# Patient Record
Sex: Female | Born: 1950 | Race: White | Hispanic: No | Smoking: Former smoker
Health system: Southern US, Community
[De-identification: ages and names within clinical notes are randomized; demographics above are authoritative.]

## PROBLEM LIST (undated history)

## (undated) DIAGNOSIS — I10 Essential (primary) hypertension: Secondary | ICD-10-CM

## (undated) DIAGNOSIS — E559 Vitamin D deficiency, unspecified: Secondary | ICD-10-CM

## (undated) DIAGNOSIS — J439 Emphysema, unspecified: Secondary | ICD-10-CM

## (undated) DIAGNOSIS — I499 Cardiac arrhythmia, unspecified: Secondary | ICD-10-CM

## (undated) DIAGNOSIS — E7800 Pure hypercholesterolemia, unspecified: Secondary | ICD-10-CM

## (undated) DIAGNOSIS — R0600 Dyspnea, unspecified: Secondary | ICD-10-CM

## (undated) DIAGNOSIS — Z923 Personal history of irradiation: Secondary | ICD-10-CM

## (undated) DIAGNOSIS — Z9221 Personal history of antineoplastic chemotherapy: Secondary | ICD-10-CM

## (undated) DIAGNOSIS — F329 Major depressive disorder, single episode, unspecified: Secondary | ICD-10-CM

## (undated) DIAGNOSIS — Z800 Family history of malignant neoplasm of digestive organs: Secondary | ICD-10-CM

## (undated) DIAGNOSIS — Z803 Family history of malignant neoplasm of breast: Secondary | ICD-10-CM

## (undated) DIAGNOSIS — I493 Ventricular premature depolarization: Secondary | ICD-10-CM

## (undated) DIAGNOSIS — D649 Anemia, unspecified: Secondary | ICD-10-CM

## (undated) DIAGNOSIS — H269 Unspecified cataract: Secondary | ICD-10-CM

## (undated) DIAGNOSIS — C44621 Squamous cell carcinoma of skin of unspecified upper limb, including shoulder: Secondary | ICD-10-CM

## (undated) DIAGNOSIS — C44622 Squamous cell carcinoma of skin of right upper limb, including shoulder: Secondary | ICD-10-CM

## (undated) DIAGNOSIS — E669 Obesity, unspecified: Secondary | ICD-10-CM

## (undated) DIAGNOSIS — R9431 Abnormal electrocardiogram [ECG] [EKG]: Secondary | ICD-10-CM

## (undated) DIAGNOSIS — E119 Type 2 diabetes mellitus without complications: Secondary | ICD-10-CM

## (undated) DIAGNOSIS — E785 Hyperlipidemia, unspecified: Secondary | ICD-10-CM

## (undated) DIAGNOSIS — E079 Disorder of thyroid, unspecified: Secondary | ICD-10-CM

## (undated) HISTORY — DX: Family history of malignant neoplasm of digestive organs: Z80.0

## (undated) HISTORY — DX: Family history of malignant neoplasm of breast: Z80.3

## (undated) HISTORY — DX: Major depressive disorder, single episode, unspecified: F32.9

## (undated) HISTORY — DX: Squamous cell carcinoma of skin of right upper limb, including shoulder: C44.622

## (undated) HISTORY — DX: Vitamin D deficiency, unspecified: E55.9

## (undated) HISTORY — DX: Squamous cell carcinoma of skin of unspecified upper limb, including shoulder: C44.621

## (undated) HISTORY — PX: SQUAMOUS CELL CARCINOMA EXCISION: SHX2433

## (undated) HISTORY — DX: Unspecified cataract: H26.9

## (undated) HISTORY — PX: OTHER SURGICAL HISTORY: SHX169

## (undated) HISTORY — PX: PR UNLISTED PROCEDURE BREAST: 19499

## (undated) HISTORY — PX: PR COLONOSCOPY STOMA DX INCLUDING COLLJ SPEC SPX: 44388

## (undated) HISTORY — DX: Abnormal electrocardiogram (ECG) (EKG): R94.31

## (undated) HISTORY — DX: Disorder of thyroid, unspecified: E07.9

## (undated) HISTORY — DX: Type 2 diabetes mellitus without complications: E11.9

## (undated) HISTORY — DX: Obesity, unspecified: E66.9

## (undated) HISTORY — DX: Hyperlipidemia, unspecified: E78.5

## (undated) MED ORDER — LEVOTHYROXINE SODIUM 112 MCG OR TABS
ORAL_TABLET | ORAL | 1 refills | Status: AC
Start: 2015-12-17 — End: ?

## (undated) MED ORDER — METFORMIN HCL 1000 MG OR TABS
ORAL_TABLET | ORAL | Status: AC
Start: 2015-06-13 — End: ?

## (undated) MED ORDER — NOVOLIN 70/30 RELION (70-30) 100 UNIT/ML SC SUSP
SUBCUTANEOUS | 0 refills | Status: AC
Start: 2017-01-08 — End: ?

---

## 1969-02-03 HISTORY — PX: TONSILLECTOMY: SUR1361

## 1988-02-04 HISTORY — PX: PR TOTAL ABDOMINAL HYSTERECT W/WO RMVL TUBE OVARY: 58150

## 1991-02-04 HISTORY — PX: SURGICAL HX OTHER: 99

## 1992-02-04 HISTORY — PX: APPENDECTOMY: SHX54

## 1993-02-03 HISTORY — PX: ABDOMINAL HYSTERECTOMY: SHX81

## 1996-02-04 HISTORY — PX: BREAST BIOPSY: SHX20

## 2006-02-03 HISTORY — PX: MICRODISCECTOMY LUMBAR: SUR864

## 2006-05-27 ENCOUNTER — Ambulatory Visit (HOSPITAL_COMMUNITY): Admission: RE | Admit: 2006-05-27 | Discharge: 2006-05-28 | Payer: Self-pay | Admitting: Neurosurgery

## 2006-07-14 ENCOUNTER — Ambulatory Visit (HOSPITAL_COMMUNITY): Admission: RE | Admit: 2006-07-14 | Discharge: 2006-07-14 | Payer: Self-pay | Admitting: Neurosurgery

## 2006-08-24 ENCOUNTER — Ambulatory Visit (HOSPITAL_COMMUNITY): Admission: RE | Admit: 2006-08-24 | Discharge: 2006-08-25 | Payer: Self-pay | Admitting: Neurosurgery

## 2007-05-25 ENCOUNTER — Ambulatory Visit: Payer: Self-pay | Admitting: Family Medicine

## 2007-07-24 ENCOUNTER — Ambulatory Visit (HOSPITAL_COMMUNITY): Admission: RE | Admit: 2007-07-24 | Discharge: 2007-07-24 | Payer: Self-pay | Admitting: Neurosurgery

## 2008-02-26 ENCOUNTER — Encounter: Admission: RE | Admit: 2008-02-26 | Discharge: 2008-02-26 | Payer: Self-pay | Admitting: Neurosurgery

## 2008-06-08 ENCOUNTER — Encounter: Admission: RE | Admit: 2008-06-08 | Discharge: 2008-06-08 | Payer: Self-pay | Admitting: Neurosurgery

## 2008-06-19 ENCOUNTER — Ambulatory Visit (HOSPITAL_COMMUNITY): Admission: RE | Admit: 2008-06-19 | Discharge: 2008-06-20 | Payer: Self-pay | Admitting: Neurosurgery

## 2009-10-26 ENCOUNTER — Ambulatory Visit: Payer: Self-pay | Admitting: Family Medicine

## 2010-05-14 LAB — CBC
HCT: 37.3 % (ref 36.0–46.0)
Hemoglobin: 12.8 g/dL (ref 12.0–15.0)
MCHC: 34.2 g/dL (ref 30.0–36.0)
MCV: 89.4 fL (ref 78.0–100.0)
RDW: 14.5 % (ref 11.5–15.5)

## 2010-05-14 LAB — BASIC METABOLIC PANEL
BUN: 12 mg/dL (ref 6–23)
Chloride: 103 mEq/L (ref 96–112)
Glucose, Bld: 86 mg/dL (ref 70–99)
Potassium: 4.2 mEq/L (ref 3.5–5.1)

## 2010-06-18 NOTE — Op Note (Signed)
NAME:  Victoria Dean, Victoria Dean NO.:  [id number]   MEDICAL RECORD NO.:  [id number]          PATIENT TYPE:  OIB   LOCATION:  3172                         FACILITY:  [facility]   PHYSICIAN:  Cristi Loron, M.D.DATE OF BIRTH:  Aug 09, 1950   DATE OF PROCEDURE:  06/19/2008  DATE OF DISCHARGE:                               OPERATIVE REPORT   BRIEF HISTORY:  The patient is a 60 year old female who has suffered  from 2 previous L4-L5 ruptured disk.  She has developed recurrent back  and leg pain and was worked up with a lumbar MRI, which demonstrated a  small-to-moderate ruptured disk, L4-L5 and more large far lateral  ruptured disk at L3-L4 on the right.  I discussed the various treatment  options with the patient including surgery.  The patient has weighed the  risks, benefits, alternatives of surgery and decided to proceed with a  redo right L4-L5 diskectomy as well as a right L3-L4 far lateral  diskectomy.   PREOPERATIVE DIAGNOSES:  Recurrent L4-L5 herniated nucleus pulposus,  right L3-L4 far lateral herniated nucleus pulposus, lumbago, lumbar  radiculopathy, disk degeneration.   POSTOPERATIVE DIAGNOSES:  Recurrent L4-L5 herniated nucleus pulposus,  right L3-L4 far lateral herniated nucleus pulposus, lumbago, lumbar  radiculopathy, disk degeneration.   PROCEDURES:  Redo right L4-L5 diskectomy and right L3-L4 far lateral  diskectomy using microdissection.   SURGEON:  Cristi Loron, MD.   ASSISTANT:  Stefani Dama, MD   ANESTHESIA:  General endotracheal.   ESTIMATED BLOOD LOSS:  100 mL.   SPECIMENS:  None.   DRAINS:  None.   COMPLICATIONS:  None.   DESCRIPTION OF PROCEDURE:  The patient was brought to the operating room  by the Anesthesia team.  General endotracheal anesthesia was induced.  The patient was turned to the prone position on Wilson frame.  Her  lumbosacral region was then prepared with Betadine scrub and Betadine  solution.  Sterile drapes were  applied.  I then injected the area to be  incised with Marcaine with epinephrine solution.  I used scalpel to make  a linear midline incision through the patient's previous surgical scar  and extended it somewhat in a cephalad direction.  We then used  electrocautery to perform a right-sided subperiosteal dissection  exposing spinous process and lamina of L3, L4, L5.  We obtained a series  of intraoperative radiographs to confirm our location.   We began the decompression at L4-L5 by using high-speed drill to widen  the patient's prior L4 laminotomy.  We carefully dissected through the  epidural scar tissue and identified the dura.  We performed a  foraminotomy about the right L5 nerve root.  We then used  microdissection to free up the nerve root and thecal sac from the  epidural fibrosis and then Dr. Danielle Dess gently retracted the neural  structures medially.  This exposed the disk herniation at L4-L5.  We  incised the L4-L5 intervertebral disk and performed a partial  intervertebral diskectomy using the pituitary forceps and the Epstein  curettes.  After we were satisfied with the intervertebral diskectomy,  we used the osteophyte tool to remove some redundant ligament from the  vertebral endplates at L4-L5 further decompressing L5 nerve root.  We  then palpated along ventral surface of the thecal sac along exit route  of the L5 nerve root and noted that it was well decompressed.   We now turned our attention to far lateral diskectomy at L3-L4.  We used  high-speed drill to remove the lateral aspect of the right L3 pars.  This gave [place] access to intertransverse ligament.  We removed the  ligament with the Kerrison punches and then carefully dissected through  the intertransverse muscle and using microdissection we exposed the  exiting L3 nerve root.  We then dissected anterior and caudal to the L3  nerve root and encountered multiple large fragments of disk herniation.  We removed these  disk herniated fragments using the micropituitary  forceps.  We did encounter a far lateral hole in the annulus fibrosus,  but we got a good decompression of the nerve root and we did not enter  into the intervertebral disk space.  We then palpated along the ventral  surface of the L3 nerve root and noted that it was well decompressed  from its intraspinal portion all the way up to soft tissues.  We  obtained hemostasis using bipolar electrocautery.  We irrigated the  wound out with bacitracin solution.  We then removed the retractors and  reapproximated the patient's thoracolumbar fascia with interrupted #1  Vicryl suture, subcutaneous tissue with 2-0 Vicryl suture, and the skin  with Steri-Strips and Benzoin.  The wound was then coated with  bacitracin ointment and sterile dressings applied.  The drapes were  removed.  The patient subsequently returned to supine position where she  was extubated by the Anesthesia team and transported to post anesthesia  care unit in stable condition.  All sponge, instrument, and needle  counts were correct at the end of this case.      Cristi Loron, M.D.  Electronically Signed     JDJ/MEDQ  D:  06/19/2008  T:  06/20/2008  Job:  [id number]

## 2010-06-18 NOTE — Op Note (Signed)
NAME:  Victoria Dean, Victoria Dean NO.:  [id number]   MEDICAL RECORD NO.:  [id number]          PATIENT TYPE:  OIB   LOCATION:  3036                         FACILITY:  [facility]   PHYSICIAN:  Cristi Loron, M.D.DATE OF BIRTH:  20-Jun-1950   DATE OF PROCEDURE:  08/24/2006  DATE OF DISCHARGE:                               OPERATIVE REPORT   BRIEF HISTORY:  The patient is a 60 year old white female who has  suffered a ruptured disk at L4-5 on the right, about 4 months ago.  She  underwent a microdiskectomy at that time and initially did well; but,  she has developed a recurrence of her upper back and right leg pain.  She failed medical management and was worked up with a lumbar MRI, which  demonstrated a recurrent herniated disk at L4-5.  I discussed the  various treatment options with the patient including surgery.  The  patient has weighed the risks, benefits and alternatives and agreed to  undergo a redo right L4-5 microdiskectomy.   PREOPERATIVE DIAGNOSES:  1. Recurrent right L4-5 herniated nucleus pulposus.  2. Disk degeneration.  3. Lumbar radiculopathy.  4. Lumbago.   POSTOPERATIVE DIAGNOSES:  1. Recurrent right L4-5 herniated nucleus pulposus.  2. Disk degeneration.  3. Lumbar radiculopathy.  4. Lumbago.   PROCEDURE:  Redo right L4 hemilaminectomy to decompress both the right  L4 and L5 nerve roots, using microdissection.   SURGEON:  Cristi Loron, M.D.   ASSISTANT:  Hewitt Shorts, M.D.   ANESTHESIA:  General endotracheal.   ESTIMATED BLOOD LOSS:  100 mL.   SPECIMENS:  None.   DRAINS:  None.   COMPLICATIONS:  None.   PROCEDURE:  The patient was brought to the operating room by the  anesthesia team.  General endotracheal anesthesia was induced.  The  patient was then turned to the prone position on the Wilson frame.  The  lumbosacral region was then prepared with Betadine scrub and Betadine  solution.  Sterile drapes were applied.  I then  injected the area to be  incised with Marcaine with epinephrine solution; used a scalpel to make  a linear midline incision through her previous lumbar surgical scar.  I  then used electrocautery to perform a right-sided subperiosteal  dissection, exposing the right spinous process of the lamina of L4 and  L5.  We obtained intraoperative radiograph to confirm our location, and  then inserted the [facility] retractor for exposure.   We then brought the operative microscope into the field, and under its  magnification and illumination we completed the  microdissection/decompression.  I used a high-speed drill to drill  through some of the epidural scar tissue.  I drilled a little more  laterally.  I widened the previous L4 hemilaminectomy.  I used the high-  speed drill until I encountered some relatively nonscarred dura.  I then  used microdissection to free up the epidural scar tissue from the  underlying dura, and used a Kerrison punch to remove the epidural scar  tissue.  I then performed a foraminotomy about both the right L5  then  the L4 nerve root, exposing both these nerves in the lateral recesses.  I then inspected the intervertebral disk at L4-5 which was bulging  somewhat diffusely.  We then inspected to cephalad at L4-5  intervertebral disk, and we encountered the expected free fragment disk  herniation; this had migrated in the cephalad direction, and was  compressing the L4 nerve root just as it entered into the neural  foramen.  We freed up this disk herniation with the nerve hooks, and  using microdissection we removed multiple fragments of free fragment  disk herniation.  I somewhat entered into the neural foramen.   At this point we had a good decompression of the L4 and L5 nerve roots.  We palpated both nerve roots from both above and below using the nerve  hooks.  We then again inspected the L4-5 intervertebral disk.  We  decided not to incise the annulus, as there did  not appear to be any  impending herniations.  We then obtained hemostasis using bipolar  electrocautery.  We irrigated the wound out with bacitracin solution.  I  then palpated along the ventral surface of the thecal sac and along the  exit route of the right L4 and L5 nerve roots; both were noted to be  well decompressed.  We then removed the retractor and then  reapproximated the patient's thoracolumbar fascia with interrupted #1  Vicryl suture; the subcutaneous tissue with interrupted 2-O Vicryl  suture, and the skin with Steri-Strips and Benzoin.  The wound was then  coated with bacitracin ointment.  A sterile dressing was applied.  The  drapes were removed.  The patient was subsequently returned to the  supine position, where she was extubated by the anesthesia team and  transported to the post anesthesia care unit in stable condition.  All  sponge, instrument and needle counts were correct at the end of this  case.      Cristi Loron, M.D.  Electronically Signed     JDJ/MEDQ  D:  08/24/2006  T:  08/25/2006  Job:  [id number]

## 2010-06-21 NOTE — Op Note (Signed)
NAME:  Victoria Dean, BESSLER NO.:  [id number]   MEDICAL RECORD NO.:  [id number]          PATIENT TYPE:  AMB   LOCATION:  SDS                          FACILITY:  [facility]   PHYSICIAN:  Cristi Loron, M.D.DATE OF BIRTH:  [birth year]   DATE OF PROCEDURE:  05/27/2006  DATE OF DISCHARGE:                               OPERATIVE REPORT   BRIEF HISTORY:  The patient is a 60 year old white female who has had  approximately a one week history of severe right leg pain consistent  with a right L4 radiculopathy.  She has failed medical management and  was worked up with a lumbar MRI which demonstrated a herniated disc at  L4-L5 on the right exiting right L4 nerve root.  I discussed the various  treatment options with her including surgery.  The patient has weighed  the risks, benefits and alternatives of surgery and decided to proceed  with the right L4-L5 microdiscectomy.   PREOPERATIVE DIAGNOSIS:  Right L4-L5 herniated nucleus pulposus, spinal  stenosis, lumbar radiculopathy, lumbago.   POSTOPERATIVE DIAGNOSIS:  Right L4-L5 herniated nucleus pulposus, spinal stenosis, lumbar  radiculopathy, lumbago.   PROCEDURE:  Right L4 hemilaminectomy for decompression of the right L5  and L4 nerve root using microdissection and right L4-L5 microdiscectomy.   SURGEON:  Cristi Loron, M.D.   ASSISTANT:  Coletta Memos, M.D.   ANESTHESIA:  General endotracheal anesthesia.   ESTIMATED BLOOD LOSS:  50 mL.   SPECIMENS:  None.   DRAINS:  None.   COMPLICATIONS:  None.   DESCRIPTION OF PROCEDURE:  The patient was brought to the operating room  by the anesthesia team.  General endotracheal anesthesia was induced.  The patient was turned to the prone position on the Wilson frame.  Her  lumbosacral region was then prepared with Betadine scrub and Betadine  solution.  Sterile drapes were applied.  I then injected the area to be  incised with Marcaine with epinephrine solution.  I used a  scalpel to  make a linear midline incision over the L4-L5 interspace.  I used the  electrocautery to perform a right sided subperiosteal dissection  exposing the right spinous process of L4 and L5.  We obtained an  intraoperative radiograph to confirm our location.   We then inserted a [employer] retractor for exposure and then brought  the operating microscope into the field. Under instrument magnification  and illumination, we completed the microdissection/decompression.  I  used a high speed drill to perform a right L4 laminotomy.  I completed  the right L4 hemilaminectomy using the Kerrison punch.  I removed the  ligamentum flavum at L4-L5 as well as L3-L4.  This gave exposure of the  thecal sac and the exit route of the L4 and L5 nerve root.  I then  performed a foraminotomy about the right L4 and L5 nerve root.  We then  used microsection to free up the thecal sac and the L4 nerve root from  the epidural tissue.  Dr. Franky Macho gently retracted the neural structures  medially with a [employer] retractor.  This exposed  the underlying disc  herniation which was compressing the exiting right L4 nerve root just as  it entered the neural foramen.  We removed this in multiple fragments  using the micropituitary forceps.  We then inspected the L4-L5  intervertebral disc.  This was bulging but I did not see any large holes  in the annulus nor any pending herniations and there did not seem to be  significant neural compression from the disc space, itself.  We,  therefore, did not enter into the interspace.   We then obtained hemostasis using bipolar cautery.  I palpated along the  exit route of the right L4 and L5 nerve roots and noted them to be well  decompressed all the way out the neural foramen.  We then irrigated the  wound out with Bacitracin solution, removed the retractor, and then  reapproximated the patient's thoracolumbar fascia with interrupted #1  Vicryl suture, subcutaneous tissue  with interrupted 2-0 Vicryl suture,  and the skin with Steri-Strips and Benzoin.  The wound was then coated  with Bacitracin ointment.  Sterile dressings were applied.  The drapes  were removed.  The patient was subsequently returned to the supine  position where she was extubated by the anesthesia team and transported  to the post anesthesia care unit in stable condition.  All sponge,  instrument and needle counts were correct at the end of the case.      Cristi Loron, M.D.  Electronically Signed     JDJ/MEDQ  D:  05/27/2006  T:  05/27/2006  Job:  [id number]

## 2010-11-18 LAB — BASIC METABOLIC PANEL
BUN: 11
Chloride: 102
Glucose, Bld: 95
Potassium: 4.3

## 2010-11-18 LAB — CBC
HCT: 39
Hemoglobin: 13.5
MCHC: 34.5
RDW: 14.2 — ABNORMAL HIGH

## 2011-02-26 ENCOUNTER — Ambulatory Visit: Payer: Self-pay | Admitting: Family Medicine

## 2011-02-27 ENCOUNTER — Ambulatory Visit: Payer: Self-pay | Admitting: Family Medicine

## 2012-12-22 ENCOUNTER — Ambulatory Visit: Payer: Self-pay | Admitting: Family Medicine

## 2012-12-29 ENCOUNTER — Ambulatory Visit: Payer: Self-pay | Admitting: Family Medicine

## 2014-01-02 LAB — BASIC METABOLIC PANEL
BUN: 21 mg/dL (ref 4–21)
CREATININE: 0.8 mg/dL (ref 0.5–1.1)
Glucose: 88 mg/dL
Potassium: 4.1 mmol/L (ref 3.4–5.3)
SODIUM: 142 mmol/L (ref 137–147)

## 2014-01-02 LAB — CBC AND DIFFERENTIAL
HCT: 41 % (ref 36–46)
HEMOGLOBIN: 13.2 g/dL (ref 12.0–16.0)
NEUTROS ABS: 5 /uL
PLATELETS: 292 10*3/uL (ref 150–399)
WBC: 7.9 10^3/mL

## 2014-01-02 LAB — HEPATIC FUNCTION PANEL
ALT: 73 U/L — AB (ref 7–35)
AST: 56 U/L — AB (ref 13–35)
Alkaline Phosphatase: 140 U/L — AB (ref 25–125)
BILIRUBIN, TOTAL: 0.2 mg/dL

## 2014-01-02 LAB — TSH: TSH: 0.69 u[IU]/mL (ref 0.41–5.90)

## 2014-01-02 LAB — LIPID PANEL
Cholesterol: 167 mg/dL (ref 0–200)
HDL: 58 mg/dL (ref 35–70)
LDL CALC: 89 mg/dL
LDL/HDL RATIO: 1.5
TRIGLYCERIDES: 101 mg/dL (ref 40–160)

## 2014-04-17 ENCOUNTER — Ambulatory Visit: Payer: Self-pay | Admitting: Family Medicine

## 2014-04-26 ENCOUNTER — Ambulatory Visit: Payer: Self-pay | Admitting: Family Medicine

## 2014-09-17 ENCOUNTER — Other Ambulatory Visit: Payer: Self-pay | Admitting: Family Medicine

## 2014-10-11 ENCOUNTER — Ambulatory Visit (INDEPENDENT_AMBULATORY_CARE_PROVIDER_SITE_OTHER): Payer: Medicare PPO | Admitting: Family Medicine

## 2014-10-11 DIAGNOSIS — Z23 Encounter for immunization: Secondary | ICD-10-CM

## 2014-11-21 ENCOUNTER — Encounter: Payer: Self-pay | Admitting: Emergency Medicine

## 2014-11-21 DIAGNOSIS — D649 Anemia, unspecified: Secondary | ICD-10-CM | POA: Insufficient documentation

## 2014-11-21 DIAGNOSIS — L858 Other specified epidermal thickening: Secondary | ICD-10-CM | POA: Insufficient documentation

## 2014-11-21 DIAGNOSIS — F411 Generalized anxiety disorder: Secondary | ICD-10-CM | POA: Insufficient documentation

## 2014-11-21 DIAGNOSIS — J309 Allergic rhinitis, unspecified: Secondary | ICD-10-CM | POA: Insufficient documentation

## 2014-11-21 DIAGNOSIS — G510 Bell's palsy: Secondary | ICD-10-CM | POA: Insufficient documentation

## 2014-11-21 DIAGNOSIS — K649 Unspecified hemorrhoids: Secondary | ICD-10-CM | POA: Insufficient documentation

## 2014-11-21 DIAGNOSIS — I10 Essential (primary) hypertension: Secondary | ICD-10-CM | POA: Insufficient documentation

## 2014-11-21 DIAGNOSIS — F4320 Adjustment disorder, unspecified: Secondary | ICD-10-CM | POA: Insufficient documentation

## 2014-11-21 DIAGNOSIS — F329 Major depressive disorder, single episode, unspecified: Secondary | ICD-10-CM | POA: Insufficient documentation

## 2014-11-21 DIAGNOSIS — L309 Dermatitis, unspecified: Secondary | ICD-10-CM | POA: Insufficient documentation

## 2014-11-21 DIAGNOSIS — L821 Other seborrheic keratosis: Secondary | ICD-10-CM | POA: Insufficient documentation

## 2014-11-21 DIAGNOSIS — G4730 Sleep apnea, unspecified: Secondary | ICD-10-CM | POA: Insufficient documentation

## 2014-11-21 DIAGNOSIS — L570 Actinic keratosis: Secondary | ICD-10-CM | POA: Insufficient documentation

## 2014-11-21 DIAGNOSIS — J9819 Other pulmonary collapse: Secondary | ICD-10-CM | POA: Insufficient documentation

## 2014-11-21 DIAGNOSIS — M1990 Unspecified osteoarthritis, unspecified site: Secondary | ICD-10-CM | POA: Insufficient documentation

## 2014-11-21 DIAGNOSIS — F330 Major depressive disorder, recurrent, mild: Secondary | ICD-10-CM | POA: Insufficient documentation

## 2014-11-21 DIAGNOSIS — IMO0002 Reserved for concepts with insufficient information to code with codable children: Secondary | ICD-10-CM | POA: Insufficient documentation

## 2014-11-21 DIAGNOSIS — M779 Enthesopathy, unspecified: Secondary | ICD-10-CM | POA: Insufficient documentation

## 2014-11-21 DIAGNOSIS — F32A Depression, unspecified: Secondary | ICD-10-CM | POA: Insufficient documentation

## 2014-11-21 DIAGNOSIS — E669 Obesity, unspecified: Secondary | ICD-10-CM | POA: Insufficient documentation

## 2014-11-21 DIAGNOSIS — E559 Vitamin D deficiency, unspecified: Secondary | ICD-10-CM | POA: Insufficient documentation

## 2014-11-21 DIAGNOSIS — R945 Abnormal results of liver function studies: Secondary | ICD-10-CM | POA: Insufficient documentation

## 2014-11-21 DIAGNOSIS — R7989 Other specified abnormal findings of blood chemistry: Secondary | ICD-10-CM | POA: Insufficient documentation

## 2014-11-22 ENCOUNTER — Ambulatory Visit (INDEPENDENT_AMBULATORY_CARE_PROVIDER_SITE_OTHER): Payer: Medicare PPO | Admitting: Family Medicine

## 2014-11-22 ENCOUNTER — Encounter: Payer: Self-pay | Admitting: Family Medicine

## 2014-11-22 ENCOUNTER — Other Ambulatory Visit: Payer: Self-pay | Admitting: Family Medicine

## 2014-11-22 VITALS — BP 122/78 | HR 84 | Temp 98.8°F | Resp 14 | Wt 195.0 lb

## 2014-11-22 DIAGNOSIS — M25552 Pain in left hip: Secondary | ICD-10-CM | POA: Diagnosis not present

## 2014-11-22 DIAGNOSIS — I10 Essential (primary) hypertension: Secondary | ICD-10-CM

## 2014-11-22 DIAGNOSIS — Z23 Encounter for immunization: Secondary | ICD-10-CM | POA: Diagnosis not present

## 2014-11-22 DIAGNOSIS — E785 Hyperlipidemia, unspecified: Secondary | ICD-10-CM

## 2014-11-22 DIAGNOSIS — G8929 Other chronic pain: Secondary | ICD-10-CM

## 2014-11-22 DIAGNOSIS — F330 Major depressive disorder, recurrent, mild: Secondary | ICD-10-CM

## 2014-11-22 DIAGNOSIS — M549 Dorsalgia, unspecified: Secondary | ICD-10-CM | POA: Diagnosis not present

## 2014-11-22 MED ORDER — MELOXICAM 7.5 MG PO TABS: 7.5000 mg | ORAL_TABLET | Freq: Two times a day (BID) | ORAL | Status: DC | PRN

## 2014-11-22 NOTE — Telephone Encounter (Signed)
Pt informed and voiced understanding of results. 

## 2014-11-22 NOTE — Progress Notes (Signed)
Patient ID: Victoria Dean, female   DOB: 10-25-1950, 64 y.o.   MRN: [id number]    Subjective:  HPI  Hypertension follow up: Patient is here for 6 months follow up. She checks B/P occasionally and readings are 120-130/80. She is tolerating medication well. BP Readings from Last 3 Encounters:  11/22/14 122/78  05/23/14 118/76   Hyperlipidemia follow up: Patient takes Simvastatin and last levels were checked in November 2015. Lab Results  Component Value Date   CHOL 167 01/02/2014   HDL 58 01/02/2014   LDLCALC 89 01/02/2014   TRIG 101 01/02/2014   Depression follow up: Patient states she doing ok with  This. She is taking Effexor. She states keeping her grandchild helps. PHQ9 today is 2.  Back pain follow up: Patient had surgery on her right side before. She takes Tylenol arthritis as needed, Tylenol PM at night to help with pain and Valium at times to help relax also. She does fine with sitting and laying down but standing and walking is a challenge sometimes. She does have a new pain which she is not sure if it is just arthritis or similar to what she had operated on.  She has pain near left hip, pain radiates down her left leg to her knee. Sometimes it hurts to walk due to this. No tingling sensation, no weakness in the leg, leg does not give out. She states her bed is set up high and she has to use a step to get up there and putting pressure on that side does not bother her but walking steps for example is not comfortable. Tylenol arthritis helps for about 4 hours. She denies any injury to the area that she can recall.   Prior to Admission medications   Medication Sig Start Date End Date Taking? Authorizing Provider  cholecalciferol (VITAMIN D) 1000 UNITS tablet Take by mouth.   Yes Historical Provider, MD  diazepam (VALIUM) 5 MG tablet Take by mouth. 01/18/13  Yes Historical Provider, MD  HYDROcodone-acetaminophen (NORCO) 10-325 MG tablet Take by mouth. 12/12/13  Yes Historical  Provider, MD  losartan (COZAAR) 50 MG tablet TAKE 1 TABLET EVERY DAY 09/18/14  Yes Jerrol Banana., MD  simvastatin (ZOCOR) 20 MG tablet Take by mouth. 05/05/14  Yes Historical Provider, MD  triamterene-hydrochlorothiazide (DYAZIDE) 37.5-25 MG per capsule TAKE 1 CAPSULE EVERY DAY 09/18/14  Yes Jerrol Banana., MD  venlafaxine [facility]) 75 MG tablet Take by mouth. 05/05/14  Yes Historical Provider, MD    Patient Active Problem List   Diagnosis Date Noted  . Actinic keratosis 11/21/2014  . Adaptation reaction 11/21/2014  . Allergic rhinitis 11/21/2014  . Absolute anemia 11/21/2014  . Pulmonary collapse 11/21/2014  . Bell palsy 11/21/2014  . Cornu cutaneum 11/21/2014  . Clinical depression 11/21/2014  . Dermatitis, eczematoid 11/21/2014  . Abnormal LFTs 11/21/2014  . Enthesopathy 11/21/2014  . Essential (primary) hypertension 11/21/2014  . Anxiety, generalized 11/21/2014  . Hemorrhoids 11/21/2014  . Herniated nucleus pulposus 11/21/2014  . Mild episode of recurrent major depressive disorder ([place]) 11/21/2014  . Adiposity 11/21/2014  . Arthritis, degenerative 11/21/2014  . Basal cell papilloma 11/21/2014  . Apnea, sleep 11/21/2014  . Avitaminosis D 11/21/2014    No past medical history on file.  Social History   Social History  . Marital Status: Married    Spouse Name: N/A  . Number of Children: N/A  . Years of Education: N/A   Occupational History  . Not on file.  Social History Main Topics  . Smoking status: Former Smoker -- 0.50 packs/day for 10 years    Quit date: 02/03/1993  . Smokeless tobacco: Never Used     Comment: quit in 1990  . Alcohol Use: Yes     Comment: Occasionally  . Drug Use: No  . Sexual Activity: Not Currently   Other Topics Concern  . Not on file   Social History Narrative    Allergies  Allergen Reactions  . Sulfa Antibiotics     Review of Systems  Constitutional: Negative.   Eyes: Negative.   Respiratory: Negative.     Cardiovascular: Negative.   Gastrointestinal: Negative.   Musculoskeletal: Positive for back pain and joint pain.  Skin: Negative.   Neurological: Negative.   Endo/Heme/Allergies: Negative.   Psychiatric/Behavioral: Negative.     Immunization History  Administered Date(s) Administered  . Hepatitis B 06/12/1995, 07/15/1995, 12/15/1995  . Pneumococcal Conjugate-13 10/11/2014  . Pneumococcal Polysaccharide-23 11/08/2008  . Td 06/21/2004  . Tdap 03/23/2014  . Zoster 11/13/2013   Objective:  BP 122/78 mmHg  Pulse 84  Temp(Src) 98.8 F (37.1 C)  Resp 14  Wt 195 lb (88.451 kg)  Physical Exam  Constitutional: She is oriented to person, place, and time and well-developed, well-nourished, and in no distress.  HENT:  Head: Normocephalic and atraumatic.  Right Ear: External ear normal.  Left Ear: External ear normal.  Nose: Nose normal.  Eyes: Conjunctivae are normal.  Neck: Neck supple.  Cardiovascular: Normal rate, regular rhythm and normal heart sounds.   Pulmonary/Chest: Effort normal and breath sounds normal.  Abdominal: Soft.  Musculoskeletal: She exhibits no edema or tenderness.  Right leg raise negative. Figure-of-four maneuver on both hips negative.  Neurological: She is alert and oriented to person, place, and time. Gait normal.  Skin: Skin is warm and dry.  Psychiatric: Mood, memory, affect and judgment normal.    Lab Results  Component Value Date   WBC 7.9 01/02/2014   HGB 13.2 01/02/2014   HCT 41 01/02/2014   PLT 292 01/02/2014   GLUCOSE 86 06/16/2008   CHOL 167 01/02/2014   TRIG 101 01/02/2014   HDL 58 01/02/2014   LDLCALC 89 01/02/2014   TSH 0.69 01/02/2014    CMP     Component Value Date/Time   NA 142 01/02/2014   NA 140 06/16/2008 1357   K 4.1 01/02/2014   CL 103 06/16/2008 1357   CO2 31 06/16/2008 1357   GLUCOSE 86 06/16/2008 1357   BUN 21 01/02/2014   BUN 12 06/16/2008 1357   CREATININE 0.8 01/02/2014   CREATININE 0.77 06/16/2008 1357    CALCIUM 9.5 06/16/2008 1357   AST 56* 01/02/2014   ALT 73* 01/02/2014   ALKPHOS 140* 01/02/2014   GFRNONAA >60 06/16/2008 1357   GFRAA  06/16/2008 1357    >60        The eGFR has been calculated using the MDRD equation. This calculation has not been validated in all clinical situations. eGFR's persistently <60 mL/min signify possible Chronic Kidney Disease.    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 Refer to Dr. Margaretmary Eddy Hypertension Hyperlipidemia Major depressive disorder Presently in remission Obtain baseline labs for above issues. Return to clinic 6 months. Miguel Aschoff MD [facility] 11/22/2014 8:28 AM

## 2014-11-22 NOTE — Telephone Encounter (Signed)
Pt stated that she requested meloxicam (MOBIC) 7.5 MG tablet to be sent to [facility] in [place] but it was sent to [place]. Pt would like it be sent today to [facility] if possible. Thanks TNP

## 2014-11-23 LAB — COMPREHENSIVE METABOLIC PANEL
A/G RATIO: 1.8 (ref 1.1–2.5)
ALBUMIN: 4.4 g/dL (ref 3.6–4.8)
ALK PHOS: 101 IU/L (ref 39–117)
ALT: 67 IU/L — AB (ref 0–32)
AST: 35 IU/L (ref 0–40)
BILIRUBIN TOTAL: 0.4 mg/dL (ref 0.0–1.2)
BUN/Creatinine Ratio: 17 (ref 11–26)
BUN: 14 mg/dL (ref 8–27)
CHLORIDE: 97 mmol/L (ref 97–106)
CO2: 26 mmol/L (ref 18–29)
Calcium: 9.5 mg/dL (ref 8.7–10.3)
Creatinine, Ser: 0.81 mg/dL (ref 0.57–1.00)
GFR calc non Af Amer: 76 mL/min/{1.73_m2} (ref >59)
GFR, EST AFRICAN AMERICAN: 88 mL/min/{1.73_m2} (ref >59)
GLUCOSE: 87 mg/dL (ref 65–99)
Globulin, Total: 2.5 g/dL (ref 1.5–4.5)
Potassium: 4.4 mmol/L (ref 3.5–5.2)
Sodium: 139 mmol/L (ref 136–144)
Total Protein: 6.9 g/dL (ref 6.0–8.5)

## 2014-11-23 LAB — CBC WITH DIFFERENTIAL/PLATELET
BASOS ABS: 0 10*3/uL (ref 0.0–0.2)
Basos: 1 %
EOS (ABSOLUTE): 0.1 10*3/uL (ref 0.0–0.4)
Eos: 2 %
Hematocrit: 39.8 % (ref 34.0–46.6)
Hemoglobin: 13.1 g/dL (ref 11.1–15.9)
Immature Grans (Abs): 0 10*3/uL (ref 0.0–0.1)
Immature Granulocytes: 0 %
LYMPHS ABS: 1.7 10*3/uL (ref 0.7–3.1)
Lymphs: 27 %
MCH: 29.7 pg (ref 26.6–33.0)
MCHC: 32.9 g/dL (ref 31.5–35.7)
MCV: 90 fL (ref 79–97)
Monocytes Absolute: 0.6 10*3/uL (ref 0.1–0.9)
Monocytes: 10 %
NEUTROS ABS: 3.8 10*3/uL (ref 1.4–7.0)
Neutrophils: 60 %
PLATELETS: 266 10*3/uL (ref 150–379)
RBC: 4.41 x10E6/uL (ref 3.77–5.28)
RDW: 14.4 % (ref 12.3–15.4)
WBC: 6.2 10*3/uL (ref 3.4–10.8)

## 2014-11-23 LAB — LIPID PANEL WITH LDL/HDL RATIO
CHOLESTEROL TOTAL: 174 mg/dL (ref 100–199)
HDL: 69 mg/dL (ref >39)
LDL Calculated: 76 mg/dL (ref 0–99)
LDl/HDL Ratio: 1.1 ratio units (ref 0.0–3.2)
Triglycerides: 146 mg/dL (ref 0–149)
VLDL CHOLESTEROL CAL: 29 mg/dL (ref 5–40)

## 2014-11-23 LAB — TSH: TSH: 0.456 u[IU]/mL (ref 0.450–4.500)

## 2015-01-20 ENCOUNTER — Encounter: Payer: Self-pay | Admitting: Physician Assistant

## 2015-01-20 ENCOUNTER — Ambulatory Visit (INDEPENDENT_AMBULATORY_CARE_PROVIDER_SITE_OTHER): Payer: Medicare PPO | Admitting: Physician Assistant

## 2015-01-20 VITALS — BP 120/70 | HR 85 | Temp 98.5°F | Resp 20

## 2015-01-20 DIAGNOSIS — H6503 Acute serous otitis media, bilateral: Secondary | ICD-10-CM | POA: Diagnosis not present

## 2015-01-20 DIAGNOSIS — R062 Wheezing: Secondary | ICD-10-CM | POA: Diagnosis not present

## 2015-01-20 DIAGNOSIS — J40 Bronchitis, not specified as acute or chronic: Secondary | ICD-10-CM

## 2015-01-20 DIAGNOSIS — R059 Cough, unspecified: Secondary | ICD-10-CM

## 2015-01-20 DIAGNOSIS — R7981 Abnormal blood-gas level: Secondary | ICD-10-CM

## 2015-01-20 DIAGNOSIS — J0100 Acute maxillary sinusitis, unspecified: Secondary | ICD-10-CM | POA: Diagnosis not present

## 2015-01-20 DIAGNOSIS — R05 Cough: Secondary | ICD-10-CM | POA: Diagnosis not present

## 2015-01-20 MED ORDER — AZITHROMYCIN 250 MG PO TABS: ORAL_TABLET | ORAL | Status: DC

## 2015-01-20 MED ORDER — HYDROCODONE-HOMATROPINE 5-1.5 MG/5ML PO SYRP: 5.0000 mL | ORAL_SOLUTION | Freq: Four times a day (QID) | ORAL | Status: DC | PRN

## 2015-01-20 MED ORDER — LEVALBUTEROL HCL 1.25 MG/0.5ML IN NEBU: 1.2500 mg | INHALATION_SOLUTION | Freq: Once | RESPIRATORY_TRACT | Status: DC

## 2015-01-20 MED ORDER — PREDNISONE 10 MG PO TABS: ORAL_TABLET | ORAL | Status: DC

## 2015-01-20 MED ORDER — LEVALBUTEROL HCL 1.25 MG/3ML IN NEBU
1.2500 mg | INHALATION_SOLUTION | Freq: Once | RESPIRATORY_TRACT | Status: AC
  Administered 2015-01-20: 1.25 mg via RESPIRATORY_TRACT

## 2015-01-20 NOTE — Progress Notes (Signed)
Patient: Victoria Dean Female    DOB: 07-20-1950   64 y.o.   MRN: [id number] Visit Date: 01/20/2015  Today's Provider: Mar Daring, PA-C   Chief Complaint  Patient presents with  . Cough   Subjective:      Patient's symptoms started Sunday 01/14/2015. Cough, sob, sinus pressure. Symptoms seem to be getting worse.  Cough This is a new problem. The current episode started in the past 7 days (Sunday 01/14/2015). The problem has been gradually worsening. The problem occurs every few minutes. The cough is productive of purulent sputum. Associated symptoms include headaches, nasal congestion, postnasal drip, a sore throat, shortness of breath and wheezing. Pertinent negatives include no chest pain, chills, ear congestion, ear pain, fever or sweats. The symptoms are aggravated by fumes, lying down and exercise. She has tried rest for the symptoms. The treatment provided no relief.       Allergies  Allergen Reactions  . Sulfa Antibiotics    Previous Medications   CHOLECALCIFEROL (VITAMIN D) 1000 UNITS TABLET    Take by mouth.   DIAZEPAM (VALIUM) 5 MG TABLET    Take by mouth.   HYDROCODONE-ACETAMINOPHEN (NORCO) 10-325 MG TABLET    Take by mouth.   LOSARTAN (COZAAR) 50 MG TABLET    TAKE 1 TABLET EVERY DAY   MELOXICAM (MOBIC) 7.5 MG TABLET    Take 1 tablet (7.5 mg total) by mouth 2 (two) times daily as needed for pain.   SIMVASTATIN (ZOCOR) 20 MG TABLET    Take by mouth.   TRIAMTERENE-HYDROCHLOROTHIAZIDE (DYAZIDE) 37.5-25 MG PER CAPSULE    TAKE 1 CAPSULE EVERY DAY   VENLAFAXINE (EFFEXOR) 75 MG TABLET    Take by mouth.    Review of Systems  Constitutional: Negative for fever, chills, appetite change and fatigue.  HENT: Positive for congestion, postnasal drip, sinus pressure and sore throat. Negative for ear pain.   Respiratory: Positive for cough, shortness of breath and wheezing. Negative for chest tightness.   Cardiovascular: Negative for chest pain and palpitations.    Gastrointestinal: Negative for nausea, vomiting and abdominal pain.  Neurological: Positive for headaches. Negative for dizziness, weakness and light-headedness.    Social History  Substance Use Topics  . Smoking status: Former Smoker -- 0.50 packs/day for 10 years    Quit date: 02/03/1993  . Smokeless tobacco: Never Used     Comment: quit in 1990  . Alcohol Use: Yes     Comment: Occasionally   Objective:   BP 120/70 mmHg  Pulse 85  Temp(Src) 98.5 F (36.9 C) (Oral)  Resp 20  SpO2 91%  Physical Exam  Constitutional: She appears well-developed and well-nourished. No distress.  HENT:  Head: Normocephalic and atraumatic.  Right Ear: Hearing, external ear and ear canal normal. Tympanic membrane is bulging. Tympanic membrane is not erythematous. A middle ear effusion is present.  Left Ear: Hearing, external ear and ear canal normal. Tympanic membrane is bulging. Tympanic membrane is not erythematous. A middle ear effusion is present.  Nose: Right sinus exhibits maxillary sinus tenderness. Right sinus exhibits no frontal sinus tenderness. Left sinus exhibits maxillary sinus tenderness. Left sinus exhibits no frontal sinus tenderness.  Mouth/Throat: Uvula is midline, oropharynx is clear and moist and mucous membranes are normal. No oropharyngeal exudate.  Eyes: Conjunctivae are normal. Pupils are equal, round, and reactive to light. Right eye exhibits no discharge. Left eye exhibits no discharge. No scleral icterus.  Neck: Normal range of motion. Neck  supple. No tracheal deviation present. No thyromegaly present.  Cardiovascular: Normal rate, regular rhythm and normal heart sounds.  Exam reveals no gallop and no friction rub.   No murmur heard. Pulmonary/Chest: Effort normal. No accessory muscle usage or stridor. No respiratory distress. She has decreased breath sounds (throughout). She has wheezes (inspiratory throughout). She has no rhonchi. She has no rales.  Lymphadenopathy:    She  has no cervical adenopathy.  Skin: Skin is warm and dry. She is not diaphoretic.  Vitals reviewed.       Assessment & Plan:     1. Bronchitis Worsening. Pulse ox today in the office was 91% on arrival. She was given a breathing treatment with levo albuterol and pulse ox improved to 96% post treatment. I will also treat her with prednisone and azithromycin as below. She is to call the office if any of her symptoms worsen or fail to improve. She will have a follow-up with either myself or Dr. Rosanna Randy in 1-2 weeks to see how she is doing and to reassess her lung sounds. - predniSONE (DELTASONE) 10 MG tablet; Take 6 tabs PO on day 1&2, 5 tabs PO on day 3&4, 4 tabs PO on day 5&6, 3 tabs PO on day 7&8, 2 tabs PO on day 9&10, 1 tab PO on day 11&12.  Dispense: 42 tablet; Refill: 0 - azithromycin (ZITHROMAX) 250 MG tablet; Take 2 tablets PO on day one, and one tablet PO daily thereafter until completed.  Dispense: 6 tablet; Refill: 0  2. Bilateral acute serous otitis media, recurrence not specified Worsening. I will treat as below. I also advised her to continue to use a decongestant to help with trying to get rid of the fluid such as Mucinex DM. She voiced understanding and states that she had Mucinex DM at home that she can use. I will see her back in 1-2 weeks to reevaluate how she is doing. She is to call the office if symptoms fail to improve or worsen. - azithromycin (ZITHROMAX) 250 MG tablet; Take 2 tablets PO on day one, and one tablet PO daily thereafter until completed.  Dispense: 6 tablet; Refill: 0  3. Acute maxillary sinusitis, recurrence not specified She did have a lot of mucus edema and congestion as well as maxillary tenderness bilaterally. I will treat her as below for her symptoms. She will have a follow-up in 1-2 weeks with myself or Dr. Rosanna Randy for reevaluation. She is to call the office if symptoms fail to improve or worsen. - azithromycin (ZITHROMAX) 250 MG tablet; Take 2 tablets PO  on day one, and one tablet PO daily thereafter until completed.  Dispense: 6 tablet; Refill: 0  4. Cough Cough with pulse ox at 91% upon arrival to the office. She was given and a breathing treatment as below. Pulse ox did improve to 96% before being released. I will also treat her with Hycodan cough syrup and prednisone as below. She is to return to the office in 1-2 weeks for reevaluation with myself or Dr. Rosanna Randy. She is to call the office symptoms fail to improve or worsen. - levalbuterol (XOPENEX) nebulizer solution 1.25 mg; Take 1.25 mg by nebulization once. - predniSONE (DELTASONE) 10 MG tablet; Take 6 tabs PO on day 1&2, 5 tabs PO on day 3&4, 4 tabs PO on day 5&6, 3 tabs PO on day 7&8, 2 tabs PO on day 9&10, 1 tab PO on day 11&12.  Dispense: 42 tablet; Refill: 0 - HYDROcodone-homatropine (HYCODAN) 5-1.5 MG/5ML  syrup; Take 5 mLs by mouth every 6 (six) hours as needed for cough.  Dispense: 240 mL; Refill: 0  5. Borderline low oxygen saturation level See above medical treatment plan for cough. - levalbuterol (XOPENEX) nebulizer solution 1.25 mg; Take 1.25 mg by nebulization once.  6. Wheezing See above medical treatment plan for cough. - levalbuterol (XOPENEX) nebulizer solution 1.25 mg; Take 1.25 mg by nebulization once. - predniSONE (DELTASONE) 10 MG tablet; Take 6 tabs PO on day 1&2, 5 tabs PO on day 3&4, 4 tabs PO on day 5&6, 3 tabs PO on day 7&8, 2 tabs PO on day 9&10, 1 tab PO on day 11&12.  Dispense: 42 tablet; Refill: 0       Mar Daring, PA-C  [facility]

## 2015-01-26 ENCOUNTER — Encounter: Payer: Self-pay | Admitting: Physician Assistant

## 2015-01-26 ENCOUNTER — Ambulatory Visit (INDEPENDENT_AMBULATORY_CARE_PROVIDER_SITE_OTHER): Payer: Medicare PPO | Admitting: Physician Assistant

## 2015-01-26 VITALS — BP 128/80 | HR 84 | Temp 98.0°F | Resp 18

## 2015-01-26 DIAGNOSIS — J40 Bronchitis, not specified as acute or chronic: Secondary | ICD-10-CM | POA: Diagnosis not present

## 2015-01-26 MED ORDER — LEVOFLOXACIN 500 MG PO TABS: 500.0000 mg | ORAL_TABLET | Freq: Every day | ORAL | Status: DC

## 2015-01-26 MED ORDER — FLUTICASONE-SALMETEROL 250-50 MCG/DOSE IN AEPB: 1.0000 | INHALATION_SPRAY | Freq: Two times a day (BID) | RESPIRATORY_TRACT | Status: DC

## 2015-01-26 NOTE — Progress Notes (Signed)
Patient: Victoria Dean Female    DOB: [birth year]   64 y.o.   MRN: [id number] Visit Date: 01/26/2015  Today's Provider: Mar Daring, PA-C   Chief Complaint  Patient presents with  . Cough   Subjective:    HPI Victoria Dean is here today concern about not doing better with he URI symptoms. She was seen 12/17 for cough ans SOB. Patient pulse that day was 91% upon arrival patient was given a breathing treatment and pulse ox improved 96% post treatment. Patient was prescribed Azithromycin, Hycodan, and Prednisone. She has completed the azithromycin but did not have complete symptom resolution. Per patient today is having cough, congestion, Wheezing, fatigue, and SOB.    Allergies  Allergen Reactions  . Sulfa Antibiotics    Previous Medications   AZITHROMYCIN (ZITHROMAX) 250 MG TABLET    Take 2 tablets PO on day one, and one tablet PO daily thereafter until completed.   CHOLECALCIFEROL (VITAMIN D) 1000 UNITS TABLET    Take by mouth.   DIAZEPAM (VALIUM) 5 MG TABLET    Take by mouth.   HYDROCODONE-ACETAMINOPHEN (NORCO) 10-325 MG TABLET    Take by mouth.   HYDROCODONE-HOMATROPINE (HYCODAN) 5-1.5 MG/5ML SYRUP    Take 5 mLs by mouth every 6 (six) hours as needed for cough.   LOSARTAN (COZAAR) 50 MG TABLET    TAKE 1 TABLET EVERY DAY   MELOXICAM (MOBIC) 7.5 MG TABLET    Take 1 tablet (7.5 mg total) by mouth 2 (two) times daily as needed for pain.   PREDNISONE (DELTASONE) 10 MG TABLET    Take 6 tabs PO on day 1&2, 5 tabs PO on day 3&4, 4 tabs PO on day 5&6, 3 tabs PO on day 7&8, 2 tabs PO on day 9&10, 1 tab PO on day 11&12.   SIMVASTATIN (ZOCOR) 20 MG TABLET    Take by mouth.   TRIAMTERENE-HYDROCHLOROTHIAZIDE (DYAZIDE) 37.5-25 MG PER CAPSULE    TAKE 1 CAPSULE EVERY DAY   VENLAFAXINE (EFFEXOR) 75 MG TABLET    Take by mouth.    Review of Systems  Constitutional: Positive for fatigue. Negative for fever and chills.  HENT: Positive for congestion, postnasal drip, rhinorrhea, sinus  pressure, sneezing and sore throat. Negative for ear pain.   Respiratory: Positive for cough, chest tightness, shortness of breath and wheezing.   Cardiovascular: Negative.  Negative for chest pain.  Gastrointestinal: Negative.  Negative for nausea, vomiting and abdominal pain.  Neurological: Negative for dizziness and headaches.    Social History  Substance Use Topics  . Smoking status: Former Smoker -- 0.50 packs/day for 10 years    Quit date: 02/03/1993  . Smokeless tobacco: Never Used     Comment: quit in 1990  . Alcohol Use: Yes     Comment: Occasionally   Objective:   BP 128/80 mmHg  Pulse 84  Temp(Src) 98 F (36.7 C) (Oral)  Resp 18  Wt   SpO2 95%  Physical Exam  Constitutional: She appears well-developed and well-nourished. No distress.  HENT:  Head: Normocephalic and atraumatic.  Right Ear: Hearing, external ear and ear canal normal. Tympanic membrane is not erythematous and not bulging. A middle ear effusion is present.  Left Ear: Hearing, external ear and ear canal normal. Tympanic membrane is not erythematous and not bulging. A middle ear effusion is present.  Nose: Mucosal edema and rhinorrhea present. Right sinus exhibits maxillary sinus tenderness and frontal sinus tenderness. Left sinus exhibits  maxillary sinus tenderness and frontal sinus tenderness.  Mouth/Throat: Uvula is midline, oropharynx is clear and moist and mucous membranes are normal. No oropharyngeal exudate.  Eyes: Conjunctivae are normal. Pupils are equal, round, and reactive to light. Right eye exhibits no discharge. Left eye exhibits no discharge. No scleral icterus.  Neck: Normal range of motion. Neck supple. No tracheal deviation present. No thyromegaly present.  Cardiovascular: Normal rate, regular rhythm and normal heart sounds.  Exam reveals no gallop and no friction rub.   No murmur heard. Pulmonary/Chest: Effort normal. No accessory muscle usage or stridor. No respiratory distress. She has  wheezes (expiratory wheeze heard throughout). She has rhonchi. She has no rales.  Lymphadenopathy:    She has no cervical adenopathy.  Skin: Skin is warm and dry. She is not diaphoretic.  Vitals reviewed.       Assessment & Plan:     1. Bronchitis Did not respond to azithromycin. I will change treatment to Levaquin as below. She is to continue the prednisone until completed as well as the Hycodan cough syrup as needed. I also gave her an Advair inhaler as below today in the office. I advised her on how to use the inhaler. She is to use this twice daily while she is sick. She also needs to make sure to be staying well-hydrated and to get plenty of rest. She does already have an appointment scheduled for next Friday for follow-up to reevaluate her lungs. She does continue to have expiratory wheezing but it is much improved compared to lung sounds on Saturday. She may keep her appointment next Friday if she chooses to be reevaluated if she is still symptomatic but if she has improved she may call and cancel this appointment. If she is still symptomatic next week and with expiratory wheezes that may be beneficial at that time to get a x-ray and to get some labs. I did offer to get a chest x-ray today but the patient refused stated she wanted to wait and try another antibiotic before getting an x-ray. She is to call the office if she has any acute issues or worsening symptoms. If not I will see her back next week if the appointment is still necessary. - levofloxacin (LEVAQUIN) 500 MG tablet; Take 1 tablet (500 mg total) by mouth daily.  Dispense: 10 tablet; Refill: 0 - Fluticasone-Salmeterol (ADVAIR DISKUS) 250-50 MCG/DOSE AEPB; Inhale 1 puff into the lungs 2 (two) times daily.  Dispense: 60 each; Refill: 0       Mar Daring, PA-C  [facility]

## 2015-01-26 NOTE — Patient Instructions (Signed)

## 2015-02-02 ENCOUNTER — Encounter: Payer: Self-pay | Admitting: Physician Assistant

## 2015-02-02 ENCOUNTER — Ambulatory Visit (INDEPENDENT_AMBULATORY_CARE_PROVIDER_SITE_OTHER): Payer: Medicare PPO | Admitting: Physician Assistant

## 2015-02-02 VITALS — BP 134/68 | HR 110 | Temp 99.2°F | Resp 18

## 2015-02-02 DIAGNOSIS — H66001 Acute suppurative otitis media without spontaneous rupture of ear drum, right ear: Secondary | ICD-10-CM

## 2015-02-02 MED ORDER — CIPROFLOXACIN-DEXAMETHASONE 0.3-0.1 % OT SUSP: 4.0000 [drp] | Freq: Two times a day (BID) | OTIC | Status: DC

## 2015-02-02 NOTE — Patient Instructions (Signed)
Otitis Media, Adult Otitis media is redness, soreness, and inflammation of the middle ear. Otitis media may be caused by allergies or, most commonly, by infection. Often it occurs as a complication of the common cold. SIGNS AND SYMPTOMS Symptoms of otitis media may include:  Earache.  Fever.  Ringing in your ear.  Headache.  Leakage of fluid from the ear. DIAGNOSIS To diagnose otitis media, your health care provider will examine your ear with an otoscope. This is an instrument that allows your health care provider to see into your ear in order to examine your eardrum. Your health care provider also will ask you questions about your symptoms. TREATMENT  Typically, otitis media resolves on its own within 3-5 days. Your health care provider may prescribe medicine to ease your symptoms of pain. If otitis media does not resolve within 5 days or is recurrent, your health care provider may prescribe antibiotic medicines if he or she suspects that a bacterial infection is the cause. HOME CARE INSTRUCTIONS   If you were prescribed an antibiotic medicine, finish it all even if you start to feel better.  Take medicines only as directed by your health care provider.  Keep all follow-up visits as directed by your health care provider. SEEK MEDICAL CARE IF:  You have otitis media only in one ear, or bleeding from your nose, or both.  You notice a lump on your neck.  You are not getting better in 3-5 days.  You feel worse instead of better. SEEK IMMEDIATE MEDICAL CARE IF:   You have pain that is not controlled with medicine.  You have swelling, redness, or pain around your ear or stiffness in your neck.  You notice that part of your face is paralyzed.  You notice that the bone behind your ear (mastoid) is tender when you touch it. MAKE SURE YOU:   Understand these instructions.  Will watch your condition.  Will get help right away if you are not doing well or get worse.   This  information is not intended to replace advice given to you by your health care provider. Make sure you discuss any questions you have with your health care provider.   Document Released: 10/26/2003 Document Revised: 02/10/2014 Document Reviewed: 08/17/2012 Elsevier Interactive Patient Education 2016 [employer]  Ciprofloxacin otic solution What is this medicine? CIPROFLOXACIN (sip roe FLOX a sin) is used to treat ear infections. This medicine may be used for other purposes; ask your health care provider or pharmacist if you have questions. What should I tell my health care provider before I take this medicine? They need to know if you have any of these conditions: -an unusual or allergic reaction to ciprofloxacin, other medicines, foods, dyes, or preservatives -pregnant or trying to get pregnant -breast-feeding How should I use this medicine? This medicine is only for use in the ears. Wash your hands with soap and water. Do not insert any object or swab into the ear canal. Gently warm the bottle by holding it in your hand for 1 to 2 minutes. Lie down on your side with the affected ear up. Try not to touch the tip of the dropper to your ear, fingertips, or other surface. Squeeze the container gently to put the entire contents in the ear canal. Stay in this position for 30 to 60 seconds to help the drops soak into the ear. Repeat the steps for the other ear if both ears are infected. Do not use your medicine more often than  directed. Finish the full course of medicine prescribed by your doctor or health care professional even if you think your condition is better. Talk to your pediatrician regarding the use of this medicine in children. While this drug may be prescribed for children as young as 1 year of age for selected conditions, precautions do apply. Overdosage: If you think you have taken too much of this medicine contact a poison control center or emergency room at once. NOTE: This medicine  is only for you. Do not share this medicine with others. What if I miss a dose? If you miss a dose, use it as soon as you can. If it is almost time for your next dose, use only that dose. Do not use double or extra doses. What may interact with this medicine? Interactions are not expected. Do not use any other ear products without telling your doctor or health care professional. This list may not describe all possible interactions. Give your health care provider a list of all the medicines, herbs, non-prescription drugs, or dietary supplements you use. Also tell them if you smoke, drink alcohol, or use illegal drugs. Some items may interact with your medicine. What should I watch for while using this medicine? Tell your doctor or healthcare professional if your symptoms do not start to get better or if they get worse. It is important that you keep the infected ear(s) clean and dry. When bathing, try not to get the infected ear(s) wet. Do not go swimming unless your doctor or health care professional has told you otherwise. What side effects may I notice from receiving this medicine? Side effects that you should report to your doctor or health care professional as soon as possible: -allergic reactions like skin rash, itching or hives, swelling of the face, lips, or tongue -burning, itching, and redness -ear pain that gets worse Side effects that usually do not require medical attention (report to your doctor or health care professional if they continue or are bothersome): -abnormal feeling in the ear -headache -unpleasant feeling while putting the drops in the ear This list may not describe all possible side effects. Call your doctor for medical advice about side effects. You may report side effects to FDA at 1-800-FDA-1088. Where should I keep my medicine? Keep out of the reach of children. Store at room temperature between 15 and 25 degrees C (59 and 77 degrees F). Do not freeze. Protect unused  containers from light in foil pouch. Throw away any unused medicine after the expiration date. NOTE: This sheet is a summary. It may not cover all possible information. If you have questions about this medicine, talk to your doctor, pharmacist, or health care provider.    2016, Elsevier/Gold Standard. (2007-10-26 13:39:53)

## 2015-02-02 NOTE — Progress Notes (Signed)
Patient ID: Victoria Dean, female   DOB: 25-Feb-1950, 64 y.o.   MRN: [id number]       Patient: Victoria Dean Female    DOB: 01-01-1951   64 y.o.   MRN: [id number] Visit Date: 02/02/2015  Today's Provider: Mar Daring, PA-C   Chief Complaint  Patient presents with  . Bronchitis   Subjective:    HPI  Patient is here for bronchitis follow up. Last office visit was on December 23rd. She is still taking Levaquin and cough syrup. She is at least 70% better. Cough, SOB and wheezing has improved. She does mention her ears started to hurt yesterday but other than that felt much better.    Allergies  Allergen Reactions  . Sulfa Antibiotics    Previous Medications   CHOLECALCIFEROL (VITAMIN D) 1000 UNITS TABLET    Take by mouth.   DIAZEPAM (VALIUM) 5 MG TABLET    Take by mouth.   FLUTICASONE-SALMETEROL (ADVAIR DISKUS) 250-50 MCG/DOSE AEPB    Inhale 1 puff into the lungs 2 (two) times daily.   HYDROCODONE-ACETAMINOPHEN (NORCO) 10-325 MG TABLET    Take by mouth.   HYDROCODONE-HOMATROPINE (HYCODAN) 5-1.5 MG/5ML SYRUP    Take 5 mLs by mouth every 6 (six) hours as needed for cough.   LEVOFLOXACIN (LEVAQUIN) 500 MG TABLET    Take 1 tablet (500 mg total) by mouth daily.   LOSARTAN (COZAAR) 50 MG TABLET    TAKE 1 TABLET EVERY DAY   MELOXICAM (MOBIC) 7.5 MG TABLET    Take 1 tablet (7.5 mg total) by mouth 2 (two) times daily as needed for pain.   PREDNISONE (DELTASONE) 10 MG TABLET    Take 6 tabs PO on day 1&2, 5 tabs PO on day 3&4, 4 tabs PO on day 5&6, 3 tabs PO on day 7&8, 2 tabs PO on day 9&10, 1 tab PO on day 11&12.   SIMVASTATIN (ZOCOR) 20 MG TABLET    Take by mouth.   TRIAMTERENE-HYDROCHLOROTHIAZIDE (DYAZIDE) 37.5-25 MG PER CAPSULE    TAKE 1 CAPSULE EVERY DAY   VENLAFAXINE (EFFEXOR) 75 MG TABLET    Take by mouth.    Review of Systems  Constitutional: Positive for fatigue. Negative for fever and chills.  HENT: Positive for ear pain. Negative for postnasal drip, rhinorrhea, sinus  pressure, sore throat, tinnitus and voice change.   Eyes: Negative.   Respiratory: Positive for cough (improving). Negative for chest tightness, shortness of breath and wheezing.   Cardiovascular: Negative.   Gastrointestinal: Negative for nausea, vomiting and abdominal pain.  Musculoskeletal: Positive for back pain and arthralgias.  Neurological: Negative for dizziness and headaches.    Social History  Substance Use Topics  . Smoking status: Former Smoker -- 0.50 packs/day for 10 years    Quit date: 02/03/1993  . Smokeless tobacco: Never Used     Comment: quit in 1990  . Alcohol Use: Yes     Comment: Occasionally   Objective:   BP 134/68 mmHg  Pulse 110  Temp(Src) 99.2 F (37.3 C)  Resp 18  Wt   Physical Exam  Constitutional: She appears well-developed and well-nourished. No distress.  HENT:  Head: Normocephalic and atraumatic.  Right Ear: Hearing, external ear and ear canal normal. Tympanic membrane is erythematous and bulging. A middle ear effusion is present.  Left Ear: Hearing, external ear and ear canal normal. Tympanic membrane is not erythematous and not bulging. A middle ear effusion is present.  Nose: Nose normal. Right sinus exhibits  no maxillary sinus tenderness and no frontal sinus tenderness. Left sinus exhibits no maxillary sinus tenderness and no frontal sinus tenderness.  Mouth/Throat: Uvula is midline, oropharynx is clear and moist and mucous membranes are normal. No oropharyngeal exudate, posterior oropharyngeal edema or posterior oropharyngeal erythema.  Eyes: Conjunctivae are normal. Pupils are equal, round, and reactive to light. Right eye exhibits no discharge. Left eye exhibits no discharge. No scleral icterus.  Neck: Normal range of motion. Neck supple. No tracheal deviation present. No thyromegaly present.  Cardiovascular: Normal rate, regular rhythm and normal heart sounds.  Exam reveals no gallop and no friction rub.   No murmur  heard. Pulmonary/Chest: Effort normal and breath sounds normal. No stridor. No respiratory distress. She has no wheezes. She has no rales.  Lymphadenopathy:    She has no cervical adenopathy.  Skin: Skin is warm and dry. She is not diaphoretic.  Vitals reviewed.       Assessment & Plan:     1. Acute suppurative otitis media of right ear without spontaneous rupture of tympanic membrane, recurrence not specified Symptoms of bronchitis have been improving but she started developing ear pain bilaterally yesterday. On exam today she does have an acute otitis media of the right ear. Left ear does have some clear effusion behind tympanic membrane but is not bulging or erythematous. She is already currently on Levaquin for bronchitis. She does have 3 days remaining of this antibiotic. I will go ahead and add Ciprodex eardrops for treatment of the acute otitis media of the right ear since it is not responding to the Levaquin. She is to call the office if symptoms fail to improve or worsen. - ciprofloxacin-dexamethasone (CIPRODEX) otic suspension; Place 4 drops into the right ear 2 (two) times daily.  Dispense: 7.5 mL; Refill: 0       Mar Daring, PA-C  [facility]

## 2015-02-15 ENCOUNTER — Other Ambulatory Visit: Payer: Self-pay | Admitting: Specialist

## 2015-02-15 DIAGNOSIS — M545 Low back pain: Secondary | ICD-10-CM

## 2015-02-19 ENCOUNTER — Other Ambulatory Visit: Payer: Self-pay | Admitting: Family Medicine

## 2015-02-19 NOTE — Telephone Encounter (Signed)
Is this ok?-aa 

## 2015-02-19 NOTE — Telephone Encounter (Signed)
Pt contacted office for refill request on the following medications:  diazepam (VALIUM) 5 MG tablet. [facility] in [place] [phone]

## 2015-02-20 MED ORDER — DIAZEPAM 5 MG PO TABS: 5.0000 mg | ORAL_TABLET | Freq: Every evening | ORAL | Status: DC | PRN

## 2015-02-20 NOTE — Telephone Encounter (Signed)
RX called in-aa 

## 2015-02-20 NOTE — Telephone Encounter (Signed)
5 refills. Thank you

## 2015-03-07 ENCOUNTER — Ambulatory Visit
Admission: RE | Admit: 2015-03-07 | Discharge: 2015-03-07 | Disposition: A | Discharge status: Home / Self Care | Payer: Medicare PPO | Source: Ambulatory Visit | Attending: Specialist | Admitting: Specialist

## 2015-03-07 DIAGNOSIS — M4806 Spinal stenosis, lumbar region: Secondary | ICD-10-CM | POA: Diagnosis not present

## 2015-03-07 DIAGNOSIS — M5126 Other intervertebral disc displacement, lumbar region: Secondary | ICD-10-CM | POA: Diagnosis not present

## 2015-03-07 DIAGNOSIS — M545 Low back pain: Secondary | ICD-10-CM

## 2015-03-07 DIAGNOSIS — M4690 Unspecified inflammatory spondylopathy, site unspecified: Secondary | ICD-10-CM | POA: Diagnosis not present

## 2015-03-07 LAB — POCT I-STAT CREATININE: Creatinine, Ser: 0.9 mg/dL (ref 0.44–1.00)

## 2015-03-07 MED ORDER — GADOBENATE DIMEGLUMINE 529 MG/ML IV SOLN
20.0000 mL | Freq: Once | INTRAVENOUS | Status: AC | PRN
  Administered 2015-03-07: 18 mL via INTRAVENOUS

## 2015-03-27 ENCOUNTER — Telehealth (INDEPENDENT_AMBULATORY_CARE_PROVIDER_SITE_OTHER): Payer: Self-pay

## 2015-03-27 ENCOUNTER — Ambulatory Visit (INDEPENDENT_AMBULATORY_CARE_PROVIDER_SITE_OTHER): Payer: Medicare Other | Admitting: Family

## 2015-03-27 ENCOUNTER — Encounter (INDEPENDENT_AMBULATORY_CARE_PROVIDER_SITE_OTHER): Payer: Self-pay | Admitting: Family

## 2015-03-27 VITALS — BP 123/76 | HR 67 | Temp 98.3°F | Resp 12 | Ht 62.0 in | Wt 191.6 lb

## 2015-03-27 DIAGNOSIS — E139 Other specified diabetes mellitus without complications: Secondary | ICD-10-CM

## 2015-03-27 DIAGNOSIS — E039 Hypothyroidism, unspecified: Secondary | ICD-10-CM

## 2015-03-27 DIAGNOSIS — I159 Secondary hypertension, unspecified: Secondary | ICD-10-CM

## 2015-03-27 DIAGNOSIS — E785 Hyperlipidemia, unspecified: Secondary | ICD-10-CM

## 2015-03-27 DIAGNOSIS — K219 Gastro-esophageal reflux disease without esophagitis: Secondary | ICD-10-CM

## 2015-03-27 NOTE — Patient Instructions (Signed)
It was a pleasure to see you in clinic today.                If you are not yet signed up for eCare, please see the below eCare section at the end of this document for how to enroll and to get your access codes.  It's easy to sign up at home today.    eCare  enrollment will allow you access to the below benefits   You can make appointments online   View test results / Lab Results   Request prescription renewals   Obtain a copy of our After Visit Summary (an electronic copy of this document)     Your Test Results:  If labs were ordered today the results are expected to be available via eCare in about 5 days. If you have an active eCare account, this is how we will notify you of your results.     If you do not have an eCare account then your test results will be mailed to you within about 14 days after your tests are completed. If your physician needs to change your care based on your results or is concerned, you should expect a phone call or eCare message from your provider.    If you have any questions about your test results please schedule an appointment with your provider, so that we may review them with you in greater detail.    **If it has been more than 2 weeks and you have not received your test results please send our office a message via eCare.    Medication Refills: If you need a prescription refilled, please contact your pharmacy 1 week before your current supply will run out to request the refill.  Contacting your pharmacy is the fastest and safest way to obtain a medication refill.  The pharmacy will notify our office.  Please note, that a minimum of 48 to 72 hours is needed to refill a medication,  Please call your pharmacy early to allow enough time to refill before you anticipate running out.  For faster medication refills, you can also schedule an appointment with your provider.    We know you have a choice in where you receive your healthcare and we sincerely thank you for trusting [facility] with your health.

## 2015-03-27 NOTE — Progress Notes (Signed)
Reviewed eCare status with Patient:  YES    HEALTH MAINTENANCE:  Has the patient had any of these since their last visit?    Cervical screening/PAP: Not Due     Mammo: Due - Referral Pended [facility] B[facility]     Colon Screen: Due - Referral and/or FIT Kit Pending Found 2 polyps at last procedure - [facility] Belling    Diabetic Eye Exam: Not Due      Have you seen a specialist since your last visit: No        HM Due: DEXA - [place], Pneumonia [address] [place], 1st dose in [place] 10 years ago  Health Maintenance   Topic Date Due   . Hepatitis C Screen  18-Sep-1950   . Cholesterol Test  12/25/1993   . Colon Cancer Screen w/ FOBT/FIT  12/26/1998   . Mammogram  12/26/1998   . DEXA Scan  12/25/2013   . Pneumococcal Vaccine (1 of 2 - PCV13) 12/25/2013   . Influenza Vaccine (1) 10/05/2014   . Tetanus Vaccine  04/09/2022   . Zoster Vaccine  Completed           No future appointments.

## 2015-03-27 NOTE — Telephone Encounter (Signed)
Initial Medical Records Request:    Facility Name:  [facility]  Type of Records (Date/Type):  All medical records 2012-present  Fax  [phone]    Forwarding to the [employer] to wait for fax.

## 2015-03-27 NOTE — Progress Notes (Signed)
CC: Here to esablish care. She changed insurance and decided to move her care from [place] to [place].  [employer] and  Medicare.  This is a new plan for her.     She has several chronic conditions including diabetes, hypertension and hyperlipidemia. She believes all are fairly stable. She is Interested in gastric sleeve. She has gained weight from the insulin and would like to try the sleeve.  Boyfriend's ex-girlfriend had the sleeve.   Would like refererral to weight clinic.    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 She had CT scan. She was also told 2 years she had pancreatic cancer. She had biopsy of her tumors. She thinks this was negative.  All of this was in 2015 (two years ago). She  Did have a repeat CT and was told the tumors were smaller in the pancreas.   Neuro: Denies weakness, memory change/cognitive change, seizures  Skin: negative   Endo: Developed in her 83s.  She was seeing Dr. Dahlia Byes.  MKS: Foot pain: Bottom area. She was diagnosed with plantar fascitis.    PMH:   Biopsy of pancreas  She has hypothyroidism (six months ago) She has had ultrasound of her neck.  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  Brother recently stopped drinking    Married   Two children A & W    Patient Active Problem List   Diagnosis   . Diabetes mellitus (HCC)   . Hyperlipidemia   . Gastroesophageal reflux disease without esophagitis   . Hypothyroidism   . Secondary hypertension     No outpatient prescriptions prior to visit.     No facility-administered medications prior to visit.     No records from [place]    OBJECTIVE  General appearance: healthy, alert, no distress dressed in modern outfit, looks  Younger than stated age  Skin: Skin color, texture, turgor normal. No rashes or concerning lesions  Neck: supple. No adenopathy. Thyroid symmetric,  normal size, without nodules  Heart Regular rate and rhythm, S1 and S2 present without murmur  Lungs: Clear no rales, rhonchi, breath sounds equal  Abdomen: soft, non-tender. BS normal. No masses or organomegaly  Psych: Mood normal, thoughts logical, affect is appropriate, insight appears appropriate for age and situation. Talks loudly and interprets occasionally    IMPRESSION  Colorful youthful female with multiple chronic conditions that appear stable.  No records today    PLAN:  Obtain records  Will review and refill medications  See back 2-3 weeks or sooner if there is an issue  Ask her to bring glucometer if possible

## 2015-03-30 ENCOUNTER — Telehealth (INDEPENDENT_AMBULATORY_CARE_PROVIDER_SITE_OTHER): Payer: Self-pay | Admitting: Family

## 2015-03-30 DIAGNOSIS — E785 Hyperlipidemia, unspecified: Secondary | ICD-10-CM | POA: Insufficient documentation

## 2015-03-30 DIAGNOSIS — K219 Gastro-esophageal reflux disease without esophagitis: Secondary | ICD-10-CM | POA: Insufficient documentation

## 2015-03-30 DIAGNOSIS — E039 Hypothyroidism, unspecified: Secondary | ICD-10-CM | POA: Insufficient documentation

## 2015-03-30 DIAGNOSIS — I159 Secondary hypertension, unspecified: Secondary | ICD-10-CM

## 2015-03-30 DIAGNOSIS — IMO0001 Reserved for inherently not codable concepts without codable children: Secondary | ICD-10-CM | POA: Insufficient documentation

## 2015-03-30 HISTORY — DX: Secondary hypertension, unspecified: I15.9

## 2015-03-30 HISTORY — DX: Gastro-esophageal reflux disease without esophagitis: K21.9

## 2015-03-30 MED ORDER — METFORMIN HCL 1000 MG OR TABS
1000.0000 mg | ORAL_TABLET | Freq: Two times a day (BID) | ORAL | Status: AC
Start: 2015-03-30 — End: ?

## 2015-03-30 MED ORDER — LEVOTHYROXINE SODIUM 112 MCG OR CAPS
112.0000 ug | ORAL_CAPSULE | Freq: Every day | ORAL | Status: DC
Start: 2015-03-30 — End: 2015-07-05

## 2015-03-30 MED ORDER — LISINOPRIL 40 MG OR TABS
40.0000 mg | ORAL_TABLET | Freq: Every day | ORAL | Status: DC
Start: 2015-03-30 — End: 2015-07-05

## 2015-03-30 MED ORDER — PANTOPRAZOLE SODIUM 40 MG OR TBEC
40.0000 mg | DELAYED_RELEASE_TABLET | Freq: Every day | ORAL | Status: DC
Start: 2015-03-30 — End: 2015-07-05

## 2015-03-30 MED ORDER — ATORVASTATIN CALCIUM 10 MG OR TABS
10.0000 mg | ORAL_TABLET | Freq: Every day | ORAL | Status: DC
Start: 2015-03-30 — End: 2015-07-05

## 2015-03-30 NOTE — Telephone Encounter (Signed)
It was radiating for records from [place] sent so I could review them.  I did refill her medications.  I called the pharmacy to confirm her insulin therapy as it appeared confusing and our records.  I left all of the dosages as is in our system and I am hopeful they are correct.  Patient was at the pharmacy.    Hopefully records will be soon

## 2015-03-30 NOTE — Telephone Encounter (Signed)
CONFIRMED PHONE NUMBER: [phone]  CALLERS FIRST AND LAST NAME: Harrison Mons  FACILITY NAME: / TITLE: /  CALLERS RELATIONSHIP:Self  RETURN CALL: Detailed message on voicemail only      Pt was seen on 03/27/15 to establish care with Fonnie Mu and had asked for her medications to be refilled  Medications: Levothyroxine, Pantoprazole and Atrovastatin.    Pt called stating that her medications have not been sent over to her pharmacy  Pt would like a call back when medications have been sent/faxed.    Simple Message sent to Fonnie Mu

## 2015-04-05 DIAGNOSIS — M9903 Segmental and somatic dysfunction of lumbar region: Secondary | ICD-10-CM | POA: Diagnosis not present

## 2015-04-05 DIAGNOSIS — M545 Low back pain: Secondary | ICD-10-CM | POA: Diagnosis not present

## 2015-04-05 DIAGNOSIS — M5416 Radiculopathy, lumbar region: Secondary | ICD-10-CM | POA: Diagnosis not present

## 2015-04-09 DIAGNOSIS — M545 Low back pain: Secondary | ICD-10-CM | POA: Diagnosis not present

## 2015-04-09 DIAGNOSIS — M9903 Segmental and somatic dysfunction of lumbar region: Secondary | ICD-10-CM | POA: Diagnosis not present

## 2015-04-09 DIAGNOSIS — M5416 Radiculopathy, lumbar region: Secondary | ICD-10-CM | POA: Diagnosis not present

## 2015-04-10 DIAGNOSIS — M5416 Radiculopathy, lumbar region: Secondary | ICD-10-CM | POA: Diagnosis not present

## 2015-04-10 DIAGNOSIS — M9903 Segmental and somatic dysfunction of lumbar region: Secondary | ICD-10-CM | POA: Diagnosis not present

## 2015-04-10 DIAGNOSIS — M545 Low back pain: Secondary | ICD-10-CM | POA: Diagnosis not present

## 2015-04-11 DIAGNOSIS — M5126 Other intervertebral disc displacement, lumbar region: Secondary | ICD-10-CM | POA: Diagnosis not present

## 2015-04-11 DIAGNOSIS — M5416 Radiculopathy, lumbar region: Secondary | ICD-10-CM | POA: Diagnosis not present

## 2015-04-12 DIAGNOSIS — M9903 Segmental and somatic dysfunction of lumbar region: Secondary | ICD-10-CM | POA: Diagnosis not present

## 2015-04-12 DIAGNOSIS — M545 Low back pain: Secondary | ICD-10-CM | POA: Diagnosis not present

## 2015-04-12 DIAGNOSIS — M5416 Radiculopathy, lumbar region: Secondary | ICD-10-CM | POA: Diagnosis not present

## 2015-04-16 ENCOUNTER — Telehealth (INDEPENDENT_AMBULATORY_CARE_PROVIDER_SITE_OTHER): Payer: Self-pay

## 2015-04-16 DIAGNOSIS — M9903 Segmental and somatic dysfunction of lumbar region: Secondary | ICD-10-CM | POA: Diagnosis not present

## 2015-04-16 DIAGNOSIS — M545 Low back pain: Secondary | ICD-10-CM | POA: Diagnosis not present

## 2015-04-16 DIAGNOSIS — M5416 Radiculopathy, lumbar region: Secondary | ICD-10-CM | POA: Diagnosis not present

## 2015-04-16 NOTE — Telephone Encounter (Signed)
Patient calling wondering if you've received her records [place] yet? I do not see anything scanned into her chart. Patient states she is expecting a call from you to discuss records further. Patient is currently in her [place] home and can be reached on her cell phone # [phone]

## 2015-04-16 NOTE — Telephone Encounter (Signed)
ROI refaxed. Routing to the front desk to watch for records from [facility].

## 2015-04-18 ENCOUNTER — Telehealth (HOSPITAL_BASED_OUTPATIENT_CLINIC_OR_DEPARTMENT_OTHER): Payer: Self-pay | Admitting: Surgery

## 2015-04-18 NOTE — Telephone Encounter (Signed)
(  TEXTING IS AN OPTION FOR [facility] CLINICS ONLY)  Is this a [facility] clinic? No      RETURN CALL: OK to leave detailed message with anyone that answers      SUBJECT:  General Message     REASON FOR REQUEST: please see below    MESSAGE: Patient called would like speak to Dr. Lenora BoysKhandelwal regarding some question before patient schedule appointment.

## 2015-04-19 DIAGNOSIS — M5416 Radiculopathy, lumbar region: Secondary | ICD-10-CM | POA: Diagnosis not present

## 2015-04-19 DIAGNOSIS — M545 Low back pain: Secondary | ICD-10-CM | POA: Diagnosis not present

## 2015-04-19 DIAGNOSIS — M9903 Segmental and somatic dysfunction of lumbar region: Secondary | ICD-10-CM | POA: Diagnosis not present

## 2015-04-20 ENCOUNTER — Telehealth (HOSPITAL_BASED_OUTPATIENT_CLINIC_OR_DEPARTMENT_OTHER): Payer: Self-pay | Admitting: Physician Assistant

## 2015-04-20 DIAGNOSIS — M545 Low back pain: Secondary | ICD-10-CM | POA: Diagnosis not present

## 2015-04-20 DIAGNOSIS — M9903 Segmental and somatic dysfunction of lumbar region: Secondary | ICD-10-CM | POA: Diagnosis not present

## 2015-04-20 DIAGNOSIS — M5416 Radiculopathy, lumbar region: Secondary | ICD-10-CM | POA: Diagnosis not present

## 2015-04-20 NOTE — Telephone Encounter (Signed)
Patient called back into clinic upset that she had not been contacted about the medical records. I let her know that we had not received these medical records but that the ROI was refaxed on 04/16/15. She would like to speak to Fonnie MuKerry Meyer in regards to her next step since we have not received medical records. What should she do in the mean time? Please advise?

## 2015-04-20 NOTE — Telephone Encounter (Signed)
Sounds like we are still waiting for records

## 2015-04-20 NOTE — Telephone Encounter (Signed)
Spoke to Danielle French at [facility]. She stated that she would try the fax again on Monday morning 04/23/15. Will call patient and let her know when we have received this fax. Forwarding to 04/23/15 for follow up.

## 2015-04-20 NOTE — Telephone Encounter (Signed)
Called patient, left voice message

## 2015-04-20 NOTE — Telephone Encounter (Signed)
Patient is interested in surgery, will have her PCP send clinic a referral, and watch the surgical seminar on-line.

## 2015-04-20 NOTE — Telephone Encounter (Signed)
[name] with [facility] medical records: calling to say that they will be sending general chart notes to fax: [phone], and clinic is welcomed to call at [phone], press 0 to do a phone request. They have not received medical requests from clinic on their end.     FAX: [phone].

## 2015-04-23 DIAGNOSIS — M5116 Intervertebral disc disorders with radiculopathy, lumbar region: Secondary | ICD-10-CM | POA: Diagnosis not present

## 2015-04-25 ENCOUNTER — Telehealth (INDEPENDENT_AMBULATORY_CARE_PROVIDER_SITE_OTHER): Payer: Self-pay | Admitting: Family

## 2015-04-25 DIAGNOSIS — E119 Type 2 diabetes mellitus without complications: Secondary | ICD-10-CM

## 2015-04-25 DIAGNOSIS — E038 Other specified hypothyroidism: Secondary | ICD-10-CM

## 2015-04-25 DIAGNOSIS — E669 Obesity, unspecified: Secondary | ICD-10-CM

## 2015-04-25 NOTE — Telephone Encounter (Signed)
I am happy to review her records. Can these be scanned into the chart?    Danielle French, Blanchard KelchARNP, PhD

## 2015-04-25 NOTE — Telephone Encounter (Signed)
Danielle French, patient's very upset , she had to wait 1 month to get her records, not sure whose fault it is but now that we have them she needs to wait another week for Danielle French to come back to the clinic. She was misdiagnosed [place] times by [place] for [place] pancreas and was looking forward to Danielle French reviewing her chart.     Please advise.     Routing to [employer]Kerry French.

## 2015-04-25 NOTE — Telephone Encounter (Signed)
Can you please scan records into chart?

## 2015-04-25 NOTE — Telephone Encounter (Signed)
Date Received: 04/20/2015     From Whom: [facility]    Patient was notified: No    Records are filed: Yes

## 2015-04-25 NOTE — Telephone Encounter (Signed)
Repeat as I lost connection     I spoke with patient    I verified she did pick up her records [facility]  I ordered future labs   I also referred her to weight management clinic    We will follow up once I review the records.   Can these be scanned into her chart?

## 2015-04-25 NOTE — Telephone Encounter (Signed)
Patient insisted to speak with Fonnie MuKerry Meyer regarding the medical records [facility].  Please contact patient back as soon as possible.  Thank you.

## 2015-04-26 NOTE — Telephone Encounter (Signed)
Records are in the Medical Records cabinet in the hallway.

## 2015-04-26 NOTE — Telephone Encounter (Signed)
Duplicate TE  Closing TE

## 2015-04-27 ENCOUNTER — Other Ambulatory Visit (INDEPENDENT_AMBULATORY_CARE_PROVIDER_SITE_OTHER): Payer: Medicare Other

## 2015-04-27 DIAGNOSIS — E038 Other specified hypothyroidism: Secondary | ICD-10-CM

## 2015-04-27 DIAGNOSIS — E119 Type 2 diabetes mellitus without complications: Secondary | ICD-10-CM

## 2015-04-27 LAB — COMPREHENSIVE METABOLIC PANEL
ALT (GPT): 38 U/L — ABNORMAL HIGH (ref 7–33)
AST (GOT): 33 U/L (ref 9–38)
Albumin: 4.1 g/dL (ref 3.5–5.2)
Alkaline Phosphatase (Total): 64 U/L (ref 38–172)
Anion Gap: 8 (ref 4–12)
Bilirubin (Total): 0.4 mg/dL (ref 0.2–1.3)
Calcium: 9.8 mg/dL (ref 8.9–10.2)
Carbon Dioxide, Total: 27 meq/L (ref 22–32)
Chloride: 102 meq/L (ref 98–108)
Creatinine: 0.7 mg/dL (ref 0.38–1.02)
GFR, Calc, African American: >60 mL/min/{1.73_m2}
GFR, Calc, European American: >60 mL/min/{1.73_m2}
Glucose: 214 mg/dL — ABNORMAL HIGH (ref 62–125)
Potassium: 4.4 meq/L (ref 3.6–5.2)
Protein (Total): 6.9 g/dL (ref 6.0–8.2)
Sodium: 137 meq/L (ref 135–145)
Urea Nitrogen: 10 mg/dL (ref 8–21)

## 2015-04-27 LAB — LIPID PANEL
Cholesterol (LDL): 36 mg/dL (ref <130)
Cholesterol/HDL Ratio: 2.7
HDL Cholesterol: 35 mg/dL — ABNORMAL LOW (ref >39)
Non-HDL Cholesterol: 59 mg/dL (ref 0–159)
Total Cholesterol: 94 mg/dL (ref <200)
Triglyceride: 116 mg/dL (ref <150)

## 2015-04-27 LAB — PR A1C RAPID, ONSITE: Hemoglobin A1C: 7.7 % — ABNORMAL HIGH (ref 4.0–6.0)

## 2015-04-27 LAB — THYROID STIMULATING HORMONE: Thyroid Stimulating Hormone: 2.032 u[IU]/mL (ref 0.400–5.000)

## 2015-04-30 ENCOUNTER — Telehealth (HOSPITAL_BASED_OUTPATIENT_CLINIC_OR_DEPARTMENT_OTHER): Payer: Self-pay | Admitting: Internal Medicine

## 2015-04-30 DIAGNOSIS — M545 Low back pain: Secondary | ICD-10-CM | POA: Diagnosis not present

## 2015-04-30 DIAGNOSIS — M5416 Radiculopathy, lumbar region: Secondary | ICD-10-CM | POA: Diagnosis not present

## 2015-04-30 DIAGNOSIS — M9903 Segmental and somatic dysfunction of lumbar region: Secondary | ICD-10-CM | POA: Diagnosis not present

## 2015-04-30 NOTE — Telephone Encounter (Signed)
(  TEXTING IS AN OPTION FOR [facility] CLINICS ONLY)  Is this a [facility] clinic? No      RETURN CALL: Detailed message on voicemail only      SUBJECT:  General Message     REASON FOR REQUEST: Return Call    MESSAGE: Patient said that the clinic was trying to reach out to her, but she didn't know who or why and requests a return call. She requested that the clinic try her home number first at [phone]. If the clinic needs to leave a message, patient requested that an extension be left as well. Please call patient back. Thank you.

## 2015-05-01 ENCOUNTER — Encounter (INDEPENDENT_AMBULATORY_CARE_PROVIDER_SITE_OTHER): Payer: Self-pay | Admitting: Family

## 2015-05-01 NOTE — Telephone Encounter (Signed)
Spoke to patient, scheduled her appointment.

## 2015-05-04 ENCOUNTER — Encounter (INDEPENDENT_AMBULATORY_CARE_PROVIDER_SITE_OTHER): Payer: Self-pay

## 2015-05-04 ENCOUNTER — Telehealth (INDEPENDENT_AMBULATORY_CARE_PROVIDER_SITE_OTHER): Payer: Self-pay | Admitting: Family

## 2015-05-04 ENCOUNTER — Encounter (INDEPENDENT_AMBULATORY_CARE_PROVIDER_SITE_OTHER): Payer: Self-pay | Admitting: Family

## 2015-05-04 DIAGNOSIS — Z794 Long term (current) use of insulin: Secondary | ICD-10-CM

## 2015-05-04 DIAGNOSIS — E139 Other specified diabetes mellitus without complications: Secondary | ICD-10-CM

## 2015-05-04 NOTE — Telephone Encounter (Signed)
Date Received: 05/04/2015    From Whom: [facility]    Patient was notified: No    Records are filed: Yes

## 2015-05-04 NOTE — Telephone Encounter (Signed)
(  TEXTING IS AN OPTION FOR [facility] CLINICS ONLY)  Is this a [facility] clinic? Yes. Patient declined the option to receive mobile text messages.      RETURN CALL: General message OK      SUBJECT:  Refill Request    MEDICATION(S): Insulin needles and accucheck test strips  NEEDED BY: As soon as possible   PRESCRIBING PROVIDER: ARNp Fonnie MuKerry Meyer  PHARMACY NAME AND LOCATION: [facility] [place] [phone]   PHARMACY PHONE: [phone]   PHARMACY FAX NUMBER: [phone]   ADDITIONAL INFORMATION: Patient would like a refill on her insulin needles and accucheck test strips as soon as possible. Please contact patient for any questions or concerns. Thank you.

## 2015-05-07 ENCOUNTER — Other Ambulatory Visit: Payer: Self-pay

## 2015-05-07 DIAGNOSIS — M545 Low back pain: Secondary | ICD-10-CM | POA: Diagnosis not present

## 2015-05-07 DIAGNOSIS — M5416 Radiculopathy, lumbar region: Secondary | ICD-10-CM | POA: Diagnosis not present

## 2015-05-07 DIAGNOSIS — M9903 Segmental and somatic dysfunction of lumbar region: Secondary | ICD-10-CM | POA: Diagnosis not present

## 2015-05-07 MED ORDER — SIMVASTATIN 20 MG PO TABS: 20.0000 mg | ORAL_TABLET | Freq: Every day | ORAL | Status: DC

## 2015-05-07 NOTE — Telephone Encounter (Signed)
1. Please ask the patient for the details of the insulin syringe-needs that she is requesting:  - Gauge (31 gauge?)  - Length (5/16"?)  - Volume (1 mL?)    2. Please ask the patient which type of Accu-Chek test strips she is using    [facility], but the phone number rings without being answered.

## 2015-05-08 ENCOUNTER — Other Ambulatory Visit: Payer: Self-pay | Admitting: Family

## 2015-05-08 DIAGNOSIS — Z794 Long term (current) use of insulin: Secondary | ICD-10-CM

## 2015-05-08 DIAGNOSIS — E139 Other specified diabetes mellitus without complications: Secondary | ICD-10-CM

## 2015-05-08 MED ORDER — GLUCOSE BLOOD VI STRP
1.0000 | ORAL_STRIP | Freq: Three times a day (TID) | Status: DC
Start: 2015-05-08 — End: 2015-05-14

## 2015-05-08 MED ORDER — INSULIN SYRINGE-NEEDLE U-100 31G X 5/16" 1 ML MISC
1.0000 | Freq: Two times a day (BID) | Status: DC
Start: 2015-05-08 — End: 2015-05-14

## 2015-05-08 NOTE — Telephone Encounter (Signed)
3rd attempt made, LVM to return call to clinic. Letter sent.     CCR:  If patient returns phone call, please transfer call to an [place] medical assistant at extension [phone]. If no one is available to answer, please take a message and forward the TE to the [place] Clinical Staff Pool.

## 2015-05-08 NOTE — Telephone Encounter (Signed)
Patient did not specify details of the insulin needles and Accu-chek test strips.    Authorized:  - Insulin Syringe-Needle U-100 31G X 5/16" 1 ML Misc  - Glucose Blood (ACCU-CHEK AVIVA) In Vitro Strip    Patient last seen on 03/27/15 for diabetes and was to return in 2 to 3 weeks.  One refill authorized.  Please schedule follow up visit.

## 2015-05-08 NOTE — Telephone Encounter (Signed)
Danielle French, [name], CNT CTR Rep at 05/04/2015 4:44 PM      Status: Signed              (TEXTING IS AN OPTION FOR [facility] CLINICS ONLY)  Is this a [facility] clinic? Yes. Patient declined the option to receive mobile text messages.      RETURN CALL: General message OK      SUBJECT: Refill Request    MEDICATION(S): Insulin needles and accucheck test strips  NEEDED BY: As soon as possible   PRESCRIBING PROVIDER: ARNp Fonnie MuKerry Meyer  PHARMACY NAME AND LOCATION: [facility] [place] [phone]   PHARMACY PHONE: [phone] PHARMACY FAX NUMBER: [phone]   ADDITIONAL INFORMATION: Patient would like a refill on her insulin needles and accucheck test strips as soon as possible. Please contact patient for any questions or concerns. Thank you.

## 2015-05-08 NOTE — Telephone Encounter (Signed)
Called and spoke to patient to let her know her refill has been sent to pharmacy. Patient has also scheduled appointment to follow up.    Closing TE

## 2015-05-10 DIAGNOSIS — M545 Low back pain: Secondary | ICD-10-CM | POA: Diagnosis not present

## 2015-05-10 DIAGNOSIS — M9903 Segmental and somatic dysfunction of lumbar region: Secondary | ICD-10-CM | POA: Diagnosis not present

## 2015-05-10 DIAGNOSIS — M5416 Radiculopathy, lumbar region: Secondary | ICD-10-CM | POA: Diagnosis not present

## 2015-05-14 ENCOUNTER — Telehealth (INDEPENDENT_AMBULATORY_CARE_PROVIDER_SITE_OTHER): Payer: Self-pay | Admitting: Family

## 2015-05-14 DIAGNOSIS — M5416 Radiculopathy, lumbar region: Secondary | ICD-10-CM | POA: Diagnosis not present

## 2015-05-14 DIAGNOSIS — M9903 Segmental and somatic dysfunction of lumbar region: Secondary | ICD-10-CM | POA: Diagnosis not present

## 2015-05-14 DIAGNOSIS — M545 Low back pain: Secondary | ICD-10-CM | POA: Diagnosis not present

## 2015-05-14 DIAGNOSIS — Z794 Long term (current) use of insulin: Secondary | ICD-10-CM

## 2015-05-14 DIAGNOSIS — E139 Other specified diabetes mellitus without complications: Secondary | ICD-10-CM

## 2015-05-14 MED ORDER — INSULIN SYRINGE-NEEDLE U-100 31G X 5/16" 1 ML MISC
1.0000 | Freq: Two times a day (BID) | Status: DC
Start: 2015-05-14 — End: 2015-07-05

## 2015-05-14 MED ORDER — GLUCOSE BLOOD VI STRP
1.0000 | ORAL_STRIP | Freq: Three times a day (TID) | Status: AC
Start: 2015-05-14 — End: ?

## 2015-05-14 NOTE — Telephone Encounter (Signed)
Sent text: [facility] [place] would like to inform you that your requested medication has been sent to your pharmacy.   Please call us at [phone] if you have any questions.

## 2015-05-14 NOTE — Telephone Encounter (Signed)
[facility]

## 2015-05-14 NOTE — Telephone Encounter (Signed)
(  TEXTING IS AN OPTION FOR [facility] CLINICS ONLY)  Is this a [facility] clinic? Yes. What is the mobile number we can use to get a hold of you via text? [phone]    RETURN CALL: Detailed message on voicemail only  SUBJECT:  Refill Request    MEDICATION(S):   1. Insulin Syringe-Needle U-100 31G X 5/16" 1 ML Misc (90 day Supply not 30 day Supply)  2. Glucose Blood (ACCU-CHEK AVIVA) In Vitro Strip (90 day Supply not 30 days supply)    NEEDED BY: Today, 05/14/15  PRESCRIBING PROVIDER: Eliane DecreeMeyer, Kerry Ellen, ARNP     PHARMACY NAME AND LOCATION: [facility] [address] [address] [place] [phone]   PHARMACY PHONE: [phone]  PHARMACY FAX NUMBER: [phone]    ADDITIONAL INFORMATION: Patient is asking that she never got her refill prescriptions and is also asking if she can get 90 day supply instead of 30. The patient is out of refills and would like a new prescription sent, Thank you.

## 2015-05-17 DIAGNOSIS — M5416 Radiculopathy, lumbar region: Secondary | ICD-10-CM | POA: Diagnosis not present

## 2015-05-17 DIAGNOSIS — M9903 Segmental and somatic dysfunction of lumbar region: Secondary | ICD-10-CM | POA: Diagnosis not present

## 2015-05-17 DIAGNOSIS — M545 Low back pain: Secondary | ICD-10-CM | POA: Diagnosis not present

## 2015-05-21 DIAGNOSIS — M5416 Radiculopathy, lumbar region: Secondary | ICD-10-CM | POA: Diagnosis not present

## 2015-05-21 DIAGNOSIS — M5126 Other intervertebral disc displacement, lumbar region: Secondary | ICD-10-CM | POA: Diagnosis not present

## 2015-05-23 ENCOUNTER — Ambulatory Visit (INDEPENDENT_AMBULATORY_CARE_PROVIDER_SITE_OTHER): Payer: Medicare PPO | Admitting: Family Medicine

## 2015-05-23 VITALS — BP 128/76 | HR 72 | Temp 98.4°F | Resp 12 | Wt 200.0 lb

## 2015-05-23 DIAGNOSIS — I10 Essential (primary) hypertension: Secondary | ICD-10-CM

## 2015-05-23 DIAGNOSIS — M549 Dorsalgia, unspecified: Secondary | ICD-10-CM

## 2015-05-23 DIAGNOSIS — G8929 Other chronic pain: Secondary | ICD-10-CM | POA: Diagnosis not present

## 2015-05-23 DIAGNOSIS — F329 Major depressive disorder, single episode, unspecified: Secondary | ICD-10-CM | POA: Diagnosis not present

## 2015-05-23 DIAGNOSIS — F32A Depression, unspecified: Secondary | ICD-10-CM

## 2015-05-23 DIAGNOSIS — E785 Hyperlipidemia, unspecified: Secondary | ICD-10-CM

## 2015-05-23 MED ORDER — SIMVASTATIN 20 MG PO TABS: 20.0000 mg | ORAL_TABLET | Freq: Every day | ORAL | Status: DC

## 2015-05-23 MED ORDER — HYDROCODONE-ACETAMINOPHEN 10-325 MG PO TABS: 1.0000 | ORAL_TABLET | ORAL | Status: DC | PRN

## 2015-05-23 MED ORDER — VENLAFAXINE HCL 75 MG PO TABS: 75.0000 mg | ORAL_TABLET | Freq: Every day | ORAL | Status: DC

## 2015-05-23 MED ORDER — DIAZEPAM 5 MG PO TABS: 5.0000 mg | ORAL_TABLET | Freq: Every evening | ORAL | Status: DC | PRN

## 2015-05-23 NOTE — Progress Notes (Signed)
Patient ID: Victoria Dean, female   DOB: [birth year], 65 y.o.   MRN: [id number]    Subjective:  HPI  Patient is here for 6 months follow up:  Hypertension: Patient does check her b/p and readings are around 128/72. No cardiac symptoms present. BP Readings from Last 3 Encounters:  05/23/15 128/76  02/02/15 134/68  01/26/15 128/80   Depression: Patient feels ok on the current medication treatment.greatly enjoys her 50-year-old granddaughter. Wishes she could do more with her physically Depression screen [facility] 2/9 11/22/2014  Decreased Interest 0  Down, Depressed, Hopeless 0  PHQ - 2 Score 0  Altered sleeping 1  Tired, decreased energy 1  Change in appetite 0  Feeling bad or failure about yourself  0  Trouble concentrating 0  Moving slowly or fidgety/restless 0  Suicidal thoughts 0  PHQ-9 Score 2  Difficult doing work/chores Not difficult at all     Chronic pain: Having issues with sciatica pain today. She is taking Norco about 3 to 6 times a week and tries to take 1 tablet at a time then. She uses Diazepam about 4 times a week at bedtime usually. Since the flare up with sciatica lately has taking more frequently and needs a refill on this medication today also along with Norco.  Prior to Admission medications   Medication Sig Start Date End Date Taking? Authorizing Provider  cholecalciferol (VITAMIN D) 1000 UNITS tablet Take by mouth.    Historical Provider, MD  diazepam (VALIUM) 5 MG tablet Take 1 tablet (5 mg total) by mouth at bedtime as needed for anxiety. 02/20/15   Javar Eshbach Maceo Pro., MD  Fluticasone-Salmeterol (ADVAIR DISKUS) 250-50 MCG/DOSE AEPB Inhale 1 puff into the lungs 2 (two) times daily. 01/26/15   Mar Daring, PA-C  HYDROcodone-acetaminophen (NORCO) 10-325 MG tablet Take by mouth. 12/12/13   Historical Provider, MD  losartan (COZAAR) 50 MG tablet TAKE 1 TABLET EVERY DAY 09/18/14   Jerrol Banana., MD  meloxicam (MOBIC) 7.5 MG tablet Take 1 tablet (7.5 mg  total) by mouth 2 (two) times daily as needed for pain. 11/22/14   Phillipe Clemon Maceo Pro., MD  simvastatin (ZOCOR) 20 MG tablet Take 1 tablet (20 mg total) by mouth daily. 05/07/15   Jerrol Banana., MD  triamterene-hydrochlorothiazide (DYAZIDE) 37.5-25 MG per capsule TAKE 1 CAPSULE EVERY DAY 09/18/14   Jerrol Banana., MD  venlafaxine [facility]) 75 MG tablet Take by mouth. 05/05/14   Historical Provider, MD    Patient Active Problem List   Diagnosis Date Noted  . Actinic keratosis 11/21/2014  . Adaptation reaction 11/21/2014  . Allergic rhinitis 11/21/2014  . Absolute anemia 11/21/2014  . Pulmonary collapse 11/21/2014  . Bell palsy 11/21/2014  . Cornu cutaneum 11/21/2014  . Clinical depression 11/21/2014  . Dermatitis, eczematoid 11/21/2014  . Abnormal LFTs 11/21/2014  . Enthesopathy 11/21/2014  . Essential (primary) hypertension 11/21/2014  . Anxiety, generalized 11/21/2014  . Hemorrhoids 11/21/2014  . Herniated nucleus pulposus 11/21/2014  . Mild episode of recurrent major depressive disorder ([place]) 11/21/2014  . Adiposity 11/21/2014  . Arthritis, degenerative 11/21/2014  . Basal cell papilloma 11/21/2014  . Apnea, sleep 11/21/2014  . Avitaminosis D 11/21/2014    No past medical history on file.  Social History   Social History  . Marital Status: Married    Spouse Name: N/A  . Number of Children: N/A  . Years of Education: N/A   Occupational History  . Not on file.  Social History Main Topics  . Smoking status: Former Smoker -- 0.50 packs/day for 10 years    Quit date: 02/03/1993  . Smokeless tobacco: Never Used     Comment: quit in 1990  . Alcohol Use: Yes     Comment: Occasionally  . Drug Use: No  . Sexual Activity: Not Currently   Other Topics Concern  . Not on file   Social History Narrative    Allergies  Allergen Reactions  . Sulfa Antibiotics     Review of Systems  Respiratory: Negative.   Cardiovascular: Negative.   Gastrointestinal:  Negative.   Musculoskeletal: Positive for back pain and joint pain.       Using a cane  Neurological: Positive for weakness (in the legs chronic from back issues).  Psychiatric/Behavioral: Negative.     Immunization History  Administered Date(s) Administered  . Hepatitis B 06/12/1995, 07/15/1995, 12/15/1995  . Influenza, High Dose Seasonal PF 11/22/2014  . Pneumococcal Conjugate-13 10/11/2014  . Pneumococcal Polysaccharide-23 11/08/2008  . Td 06/21/2004  . Tdap 03/23/2014  . Zoster 11/13/2013   Objective:  BP 128/76 mmHg  Pulse 72  Temp(Src) 98.4 F (36.9 C)  Resp 12  Wt 200 lb (90.719 kg)  Physical Exam  Constitutional: She is oriented to person, place, and time and well-developed, well-nourished, and in no distress.  HENT:  Head: Normocephalic and atraumatic.  Eyes: Conjunctivae are normal. Pupils are equal, round, and reactive to light.  Neck: Normal range of motion. Neck supple.  Cardiovascular: Normal rate, regular rhythm, normal heart sounds and intact distal pulses.   No murmur heard. Pulmonary/Chest: Effort normal and breath sounds normal. No respiratory distress. She has no wheezes.  Neurological: She is alert and oriented to person, place, and time. She is not agitated and not disoriented. No sensory deficit. Gait (uses a cane) abnormal.  Psychiatric: Mood, memory, affect and judgment normal.    Lab Results  Component Value Date   WBC 6.2 11/22/2014   HGB 13.2 01/02/2014   HCT 39.8 11/22/2014   PLT 266 11/22/2014   GLUCOSE 87 11/22/2014   CHOL 174 11/22/2014   TRIG 146 11/22/2014   HDL 69 11/22/2014   LDLCALC 76 11/22/2014   TSH 0.456 11/22/2014    CMP     Component Value Date/Time   NA 139 11/22/2014 0928   NA 140 06/16/2008 1357   K 4.4 11/22/2014 0928   CL 97 11/22/2014 0928   CO2 26 11/22/2014 0928   GLUCOSE 87 11/22/2014 0928   GLUCOSE 86 06/16/2008 1357   BUN 14 11/22/2014 0928   BUN 12 06/16/2008 1357   CREATININE 0.90 03/07/2015 1256     CREATININE 0.8 01/02/2014   CALCIUM 9.5 11/22/2014 0928   PROT 6.9 11/22/2014 0928   ALBUMIN 4.4 11/22/2014 0928   AST 35 11/22/2014 0928   ALT 67* 11/22/2014 0928   ALKPHOS 101 11/22/2014 0928   BILITOT 0.4 11/22/2014 0928   GFRNONAA 76 11/22/2014 0928   GFRAA 88 11/22/2014 0928    Assessment and Plan :  1. Essential (primary) hypertension Stable. Continue current medication.  2. Chronic back pain Stable. Refills given. Patient is seen chiropractor and has had some steroid injection for the pain and this has helped patient with her chronic pain. She is following up with Dr. Arnoldo Morale.  3. Hyperlipidemia Stable.  4. Clinical depression Stable. Has good and bad days. Refill given. I have done the exam and reviewed the above chart and it is accurate to the  best of my knowledge.  Patient was seen and examined by Dr. Eulas Post and note was scribed by Theressa Millard, RMA.    Miguel Aschoff MD [facility] 05/23/2015 11:15 AM

## 2015-05-28 ENCOUNTER — Ambulatory Visit (INDEPENDENT_AMBULATORY_CARE_PROVIDER_SITE_OTHER): Payer: Medicare Other | Admitting: Family

## 2015-05-28 ENCOUNTER — Telehealth (INDEPENDENT_AMBULATORY_CARE_PROVIDER_SITE_OTHER): Payer: Self-pay | Admitting: Family

## 2015-05-28 ENCOUNTER — Encounter (INDEPENDENT_AMBULATORY_CARE_PROVIDER_SITE_OTHER): Payer: Self-pay | Admitting: Family

## 2015-05-28 VITALS — BP 153/83 | HR 74 | Temp 97.7°F | Resp 16 | Ht 62.0 in | Wt 195.0 lb

## 2015-05-28 DIAGNOSIS — M545 Low back pain: Secondary | ICD-10-CM | POA: Diagnosis not present

## 2015-05-28 DIAGNOSIS — M5416 Radiculopathy, lumbar region: Secondary | ICD-10-CM | POA: Diagnosis not present

## 2015-05-28 DIAGNOSIS — M9903 Segmental and somatic dysfunction of lumbar region: Secondary | ICD-10-CM | POA: Diagnosis not present

## 2015-05-28 DIAGNOSIS — E119 Type 2 diabetes mellitus without complications: Secondary | ICD-10-CM

## 2015-05-28 DIAGNOSIS — R9431 Abnormal electrocardiogram [ECG] [EKG]: Secondary | ICD-10-CM

## 2015-05-28 DIAGNOSIS — Z794 Long term (current) use of insulin: Secondary | ICD-10-CM

## 2015-05-28 DIAGNOSIS — R0602 Shortness of breath: Secondary | ICD-10-CM

## 2015-05-28 NOTE — Progress Notes (Signed)
CHIEF Complaint(s)  Danielle French is a 65 year old Female here with chief complaint(s): 1) Diabetes. She checks glucose up to 4 x per day. At night time after dinner salad she finds her glucose level is  Normally lower. Last night it was off because she ate a yogurt before bed. It was 75 this morning. It seems to go way up and way down. Wondering if she needs medication adjustment.     She is interested in looking at other options    2) She also has terrible GERD. She cant tolerate red sauces.  She had to double the protonix when she eats it.  She gained 35 pounds when she quit smoking. She quit chewing gum six months ago. She thinks she eats too much bread. SHe would like to stop insulin therapy.   Ten yrs on insulin.   She was told she is type two.   She wants to be checked.   She declined diabetes foot check.        Allergies-Review of patient's allergies indicates no known allergies.    Review of Systems:  Constitution: vitals signs stable, smiling, no distress, weight   HEENT: No allergy symptoms, denies vision changes or loss  Chest: Denies heart palpitations or chest pain  Neuro: No numbness or tingling in feet or hands, mild headaches off and on    Past Medical History   Diagnosis Date   . Diabetes mellitus (HCC)    . Thyroid disease      Past Surgical History   Procedure Laterality Date   . Total abdominal hysterect w/wo rmvl tube ovary  1990     family history includes Diabetes in her brother, father, mother, and sister.  Past Surgical History   Procedure Laterality Date   . Total abdominal hysterect w/wo rmvl tube ovary  1990     Social History     Social History   . Marital Status: N/A     Spouse Name: N/A   . Number of Children: N/A   . Years of Education: N/A     Occupational History   . Not on file.     Social History Main Topics   . Smoking status: Former Games developermoker   . Smokeless tobacco: Never Used   . Alcohol Use: No   . Drug Use: No   . Sexual Activity:     Partners: Male     Other Topics Concern      . Not on file     Social History Narrative       Patient Active Problem List   Diagnosis   . Diabetes mellitus (HCC)   . Hyperlipidemia   . Gastroesophageal reflux disease without esophagitis   . Hypothyroidism   . Secondary hypertension     Current Outpatient Prescriptions   Medication Sig Dispense Refill   . Atorvastatin Calcium 10 MG Oral Tab Take 1 tablet (10 mg) by mouth daily. 90 tablet 1   . Glucose Blood (ACCU-CHEK AVIVA) In Vitro Strip Use 1 strip 3 times a day. Use to check blood sugar. 300 strip 0   . Insulin NPH Isophane & Regular (NOVOLIN 70/30) (70-30) 100 UNIT/ML Subcutaneous Suspension Inject 80 Units under the skin. Inject 80 units twice daily before meals     . Insulin Syringe-Needle U-100 31G X 5/16" 1 ML Misc Use 1 syringe 2 times a day. Use with Novolin 70/30 twice daily. 200 syringe 0   . Levothyroxine Sodium 112 MCG Oral  Cap Take 1 capsule (112 mcg) by mouth daily on an empty stomach. 90 capsule 1   . Lisinopril 40 MG Oral Tab Take 1 tablet (40 mg) by mouth daily. 90 tablet 1   . MetFORMIN HCl 1000 MG Oral Tab Take 1 tablet (1,000 mg) by mouth 2 times a day. 180 tablet 1   . Pantoprazole Sodium 40 MG Oral Tab EC Take 1 tablet (40 mg) by mouth daily. 90 tablet 1     No current facility-administered medications for this visit.         OBJECTIVE:  Constitution: patient appears healthy, smiling, no distress but obese  Heart Regular rate and rhythm, S1 and S2 present without murmur  Lungs: Clear no rales, rhonchi, breath sounds equal  Abdomen: Soft , no cva tenderness  Neuro: Negative foot exam, balance intact      Health Maintenance reviewed - reviewed, reminded and ordered.    MEDICAL DECISION MAKING:  # of diagnoses or management options: limited (b)  Amount and/or complexity of data to be reviewed: limited (b)  Risk of complications and/or morbidity or mortality: low (b)    ASSESSMENT AND PLAN    (E11.9) Controlled type 2 diabetes mellitus without complication, without long-term current use  of insulin (HCC)  (primary encounter diagnosis)  Plan: REFERRAL TO EYE CARE, ECG ROUTINE ECG W/LEAST         12 LDS W/I&R        EKG is questionable with ST changes.   We will talk on Thursday after patient reviews other types of therapies for diabetes    .Orders per documented in this encounter  Patient instructions per documented in this encounter          There are no Patient Instructions on file for this visit.

## 2015-05-28 NOTE — Progress Notes (Signed)
Reviewed eCare status with Patient:  YES    HEALTH MAINTENANCE:  Has the patient had any of these since their last visit?    Cervical screening/PAP: Not Due     Mammo: Due - Referral Pended    Colon Screen: Due - Referral and/or FIT Kit Pending    Diabetic Eye Exam: Due Referral Pended      Have you seen a specialist since your last visit: No        HM Due: DEXA, DM foot - patient declined today   Health Maintenance   Topic Date Due   . Hepatitis C Screen  01/24/49   . Diabetes Foot Exam  12/26/1966   . Diabetes Eye Exam  12/26/1966   . Mammogram  12/26/1998   . Colon Cancer Screen w/ FOBT/FIT  04/14/2013   . DEXA Scan  12/25/2013   . A1c  10/28/2015   . Pneumococcal Vaccine (2 of 2 - PPSV23) 12/14/2015   . Cholesterol Test  04/26/2020   . Tetanus Vaccine  04/09/2022   . Zoster Vaccine  Completed   . Influenza Vaccine  Completed           Future Appointments  Date Time Provider [place]   06/27/2015 1:45 PM Nicola Police [facility], [facility] WEIGHT

## 2015-05-28 NOTE — Telephone Encounter (Signed)
We discussed her EKG finding. She states she has been seen for Stress test in [place].   She does not have chest pain but admits to increase in weight and sometimes she does have pain in the back of her neck.     She did agree to go to see the Cardiologist at [facility].   Referral sent during the visit.

## 2015-06-01 ENCOUNTER — Telehealth (INDEPENDENT_AMBULATORY_CARE_PROVIDER_SITE_OTHER): Payer: Self-pay | Admitting: Family

## 2015-06-01 NOTE — Telephone Encounter (Signed)
Called Dr. Gracelyn NurseKlag's office, last EKG they have is from 2012. They will fax over.

## 2015-06-01 NOTE — Telephone Encounter (Signed)
(  TEXTING IS AN OPTION FOR [facility] CLINICS ONLY)  Is this a [facility] clinic? No      RETURN CALL: OK to leave detailed message with anyone that answers      SUBJECT:  General Message     REASON FOR REQUEST: Please call patient     MESSAGE: Danielle French wants a call from Fonnie MuKerry Meyer. She will not Specify as to why but states its very important.

## 2015-06-01 NOTE — Telephone Encounter (Signed)
PLease contact Dr. Carlye GrippeKlags office below    Goal: To get her records from her cardiologist.  We need a copy of her last EKG and echo if it is available   Thank you    Dr. Darylene PriceJoseph Klag  [phone]  Misty StanleyLisa

## 2015-06-04 DIAGNOSIS — M9903 Segmental and somatic dysfunction of lumbar region: Secondary | ICD-10-CM | POA: Diagnosis not present

## 2015-06-04 DIAGNOSIS — M5416 Radiculopathy, lumbar region: Secondary | ICD-10-CM | POA: Diagnosis not present

## 2015-06-04 DIAGNOSIS — M545 Low back pain: Secondary | ICD-10-CM | POA: Diagnosis not present

## 2015-06-07 ENCOUNTER — Encounter (INDEPENDENT_AMBULATORY_CARE_PROVIDER_SITE_OTHER): Payer: Self-pay | Admitting: Family

## 2015-06-07 NOTE — Telephone Encounter (Signed)
Did we receive this yet?

## 2015-06-08 ENCOUNTER — Encounter (INDEPENDENT_AMBULATORY_CARE_PROVIDER_SITE_OTHER): Payer: Self-pay

## 2015-06-08 NOTE — Telephone Encounter (Signed)
Received EKG, scanned in to Patient chart.    Routing to R.R. DonnelleyKerry Meyer as Lorain ChildesFYI.    Close TE when read.

## 2015-06-08 NOTE — Telephone Encounter (Addendum)
Spoke to Medical Records dept and she stated that an ROI is needed. ROI faxed to [phone]. Forwarding to 06/11/15 to check for records.

## 2015-06-11 DIAGNOSIS — M5416 Radiculopathy, lumbar region: Secondary | ICD-10-CM | POA: Diagnosis not present

## 2015-06-11 DIAGNOSIS — M9903 Segmental and somatic dysfunction of lumbar region: Secondary | ICD-10-CM | POA: Diagnosis not present

## 2015-06-11 DIAGNOSIS — M545 Low back pain: Secondary | ICD-10-CM | POA: Diagnosis not present

## 2015-06-13 ENCOUNTER — Encounter (INDEPENDENT_AMBULATORY_CARE_PROVIDER_SITE_OTHER): Payer: Self-pay | Admitting: Family Medicine

## 2015-06-13 ENCOUNTER — Other Ambulatory Visit (INDEPENDENT_AMBULATORY_CARE_PROVIDER_SITE_OTHER): Payer: Self-pay | Admitting: Family

## 2015-06-13 DIAGNOSIS — E139 Other specified diabetes mellitus without complications: Secondary | ICD-10-CM

## 2015-06-13 NOTE — Telephone Encounter (Signed)
Discussed case with chief.  Patient will be contacted tomorrow by our administrative staff to discuss her concerns.

## 2015-06-18 ENCOUNTER — Ambulatory Visit: Payer: Medicare Other | Attending: Family | Admitting: Cardiovascular Disease

## 2015-06-18 ENCOUNTER — Encounter (INDEPENDENT_AMBULATORY_CARE_PROVIDER_SITE_OTHER): Payer: Self-pay | Admitting: Cardiovascular Disease

## 2015-06-18 VITALS — BP 164/94 | HR 68 | Resp 16

## 2015-06-18 DIAGNOSIS — E119 Type 2 diabetes mellitus without complications: Secondary | ICD-10-CM

## 2015-06-18 DIAGNOSIS — R9431 Abnormal electrocardiogram [ECG] [EKG]: Secondary | ICD-10-CM | POA: Insufficient documentation

## 2015-06-18 DIAGNOSIS — Z794 Long term (current) use of insulin: Secondary | ICD-10-CM

## 2015-06-18 DIAGNOSIS — I159 Secondary hypertension, unspecified: Secondary | ICD-10-CM

## 2015-06-18 NOTE — Progress Notes (Signed)
CARDIOLOGY CLINIC INITIAL EVALUATION NOTE       Primary Care Provider:  Eliane DecreeMeyer, Kerry Ellen, ARNP  Referring Provider:  Eliane DecreeMeyer, Kerry Ellen, ARNP    REASON FOR REFERRAL    Chief Complaint   Patient presents with   . Abnormal EKG       HISTORY OF PRESENT ILLNESS:  65 year old woman with history of hypertension and diabetes mellitus referred for evaluation of abnormal EKG.  Patient states that prior to the last year or so she has been very active.  She routinely walks and when at her home in [place] she swims daily.  She is noted some increasing dyspnea on exertion although it is not currently limited.  She underwent an EKG recently which showed evidence of a possible inferior infarct.  She was referred for evaluation.  Per patient in approximately 2012 she had nausea which prompted an EKG reportedly this EKG was also abnormal.  At that time she underwent a treadmill stress test which she was told was normal.  Currently the patient walks 30 minutes daily.  She is able to climb 2 flights of stairs without stopping although is winded at the top.  She denies any chest pain, orthopnea, PND, palpitations, dizziness/lightheadedness, or syncope.    Past Medical History   Diagnosis Date   . Diabetes mellitus (HCC)    . Thyroid disease    . Secondary hypertension 03/30/2015   . Gastroesophageal reflux disease without esophagitis 03/30/2015       Past Surgical History   Procedure Laterality Date   . Total abdominal hysterect w/wo rmvl tube ovary  1990       Family History   Problem Relation Age of Onset   . Diabetes Mother    . Diabetes Father    . Diabetes Brother    . Diabetes Sister    . Heart Attack Paternal Uncle        Social History     Social History   . Marital Status: N/A     Spouse Name: N/A   . Number of Children: N/A   . Years of Education: N/A     Social History Main Topics   . Smoking status: Former Games developermoker   . Smokeless tobacco: Never Used   . Alcohol Use: No   . Drug Use: No   . Sexual Activity:     Partners: Male      Other Topics Concern   . 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  They have 2 adult children a son and daughter.       Allergies:  Review of patient's allergies indicates:  No Known Allergies    Medications:  Current Outpatient Prescriptions   Medication Sig Dispense Refill   . Atorvastatin Calcium 10 MG Oral Tab Take 1 tablet (10 mg) by mouth daily. 90 tablet 1   . Glucose Blood (ACCU-CHEK AVIVA) In Vitro Strip Use 1 strip 3 times a day. Use to check blood sugar. 300 strip 0   . Insulin NPH Isophane & Regular (NOVOLIN 70/30) (70-30) 100 UNIT/ML Subcutaneous Suspension Inject 80 Units under the skin. Inject 80 units twice daily before meals     . Insulin Syringe-Needle U-100 31G X 5/16" 1 ML Misc Use 1 syringe 2 times a day. Use with Novolin 70/30 twice daily. 200 syringe 0   . Levothyroxine Sodium 112 MCG Oral Cap Take 1 capsule (112 mcg) by  mouth daily on an empty stomach. 90 capsule 1   . Lisinopril 40 MG Oral Tab Take 1 tablet (40 mg) by mouth daily. 90 tablet 1   . MetFORMIN HCl 1000 MG Oral Tab Take 1 tablet (1,000 mg) by mouth 2 times a day. 180 tablet 1   . Pantoprazole Sodium 40 MG Oral Tab EC Take 1 tablet (40 mg) by mouth daily. 90 tablet 1     No current facility-administered medications for this visit.       Review of Systems:  As per HPI. Additionally Indigestion, liver problems, nocturia, thyroid disease, diabetes, arthritis, high blood pressure.    Otherwise complete review of systems is negative    Physical Exam  Vital Signs:  BP 164/94 mmHg  Pulse 68  Resp 16  SpO2 95%    CONSTITUTIONAL: In NAD  HEENT: Normocephalic, atraumatic, no scleral icterus or conjunctival pallor, good dentition, no erythema or exudate of oropharynx  Neck: supple with normal thyroid size  CV: RRR with normal S1 and S2, no S3 or S4, no murmurs or rubs, Nondisplaced PMI, carotid volume is full with brisk upstroke. Radial, femoral and DP pulses are 2+. No  edema, JVP 5 cmH2O.  RESP: Clear to auscultation x2 no rales  GI: +BS, soft, nontender, nondistended  MUSCULOSKELETAL: no muscle atrophy or acute bony abnormality  HEME/LYMPY/IMMUNOLOGIC: no lymphadenopathy  SKIN: warm, dry, free of rash or ulceration  NEURO: AOx3, normal gait, full affect, with good insight into medical condition    Laboratory Studies:  Results for orders placed or performed in visit on 04/27/15   A1C RAPID, ONSITE   Result Value Ref Range    Hemoglobin A1C 7.7 (H) 4.0 - 6.0 %   COMPREHENSIVE METABOLIC PANEL   Result Value Ref Range    Sodium 137 135 - 145 meq/L    Potassium 4.4 3.6 - 5.2 meq/L    Chloride 102 98 - 108 meq/L    Carbon Dioxide, Total 27 22 - 32 meq/L    Anion Gap 8 4 - 12    Glucose 214 (H) 62 - 125 mg/dL    Urea Nitrogen 10 8 - 21 mg/dL    Creatinine 1.30 8.65 - 1.02 mg/dL    Protein (Total) 6.9 6.0 - 8.2 g/dL    Albumin 4.1 3.5 - 5.2 g/dL    Bilirubin (Total) 0.4 0.2 - 1.3 mg/dL    Calcium 9.8 8.9 - 78.4 mg/dL    AST (GOT) 33 9 - 38 U/L    Alkaline Phosphatase (Total) 64 38 - 172 U/L    ALT (GPT) 38 (H) 7 - 33 U/L    GFR, Calc, European American >60 mL/min/[1.73_m2]    GFR, Calc, African American >60 mL/min/[1.73_m2]    GFR, Information       Calculated GFR in mL/min/1.73 m2 by MDRD equation.  Inaccurate with changing renal function.  See http://[email or web address].it.html   LIPID PANEL   Result Value Ref Range    Cholesterol (Total) 94 <200 mg/dL    Triglyceride 696 <295 mg/dL    Cholesterol (HDL) 35 (L) >39 mg/dL    Cholesterol (LDL) 36 <130 mg/dL    Non-HDL Cholesterol 59 0 - 159 mg/dL    Cholesterol/HDL Ratio 2.7     Lipid Panel, Additional Info. (NOTE)    THYROID STIMULATING HORMONE   Result Value Ref Range    Thyroid Stimulating Hormone 2.032 0.400 - 5.000 u[IU]/mL     Cardiac Studies:  ECG (05/28/2015): I personally reviewed  tracing.  Sinus rhythm with possible inferior infarct age undetermined.    Assessment / Plan:   65 year old woman with  cardiovascular risk factors including tobacco history, hypertension, and diabetes referred for evaluation of abnormal EKG.    Abnormal EKG - patient with significant cardiovascular risk factors.  Per history does appear that she's had inferior Q waves for some time.  She underwent stress test previously which she was told was normal.  Currently she is fairly active without significant exertional symptoms.  Based on her cardiovascular risk factors I believe it's appropriate to obtain an echocardiogram to assess for cardiac function and wall motion abnormalities.  If echocardiogram is normal then no further workup is indicated at this time.  - Echocardiogram  - Work to reduce cardiovascular risks factors including diabetes control and better control of hypertension    Hypertension - patient's blood pressure is elevated today in clinic.  I've asked her to obtain a blood pressure cuff and recorded along.  She is on high-dose lisinopril.  Consider starting second agent if her blood pressure remains elevated.    Diabetes mellitus - patient's diabetes is moderately controlled with most recent hemoglobin A1c of 7.7.  Recommend continuing to work to improve her diabetic control.    Oren Section, MD  Attending Physician, Division of Cadiology  [facility]  [place], [place]

## 2015-06-27 ENCOUNTER — Encounter (HOSPITAL_BASED_OUTPATIENT_CLINIC_OR_DEPARTMENT_OTHER): Payer: Self-pay | Admitting: Physician Assistant

## 2015-06-27 ENCOUNTER — Ambulatory Visit: Payer: Medicare Other | Attending: Physician Assistant | Admitting: Physician Assistant

## 2015-06-27 VITALS — BP 157/77 | HR 60 | Temp 97.9°F | Ht 62.21 in | Wt 195.9 lb

## 2015-06-27 DIAGNOSIS — E782 Mixed hyperlipidemia: Secondary | ICD-10-CM | POA: Insufficient documentation

## 2015-06-27 DIAGNOSIS — Z794 Long term (current) use of insulin: Secondary | ICD-10-CM | POA: Insufficient documentation

## 2015-06-27 DIAGNOSIS — I159 Secondary hypertension, unspecified: Secondary | ICD-10-CM | POA: Insufficient documentation

## 2015-06-27 DIAGNOSIS — IMO0001 Reserved for inherently not codable concepts without codable children: Secondary | ICD-10-CM

## 2015-06-27 DIAGNOSIS — K219 Gastro-esophageal reflux disease without esophagitis: Secondary | ICD-10-CM

## 2015-06-27 DIAGNOSIS — E119 Type 2 diabetes mellitus without complications: Secondary | ICD-10-CM | POA: Insufficient documentation

## 2015-06-27 DIAGNOSIS — E669 Obesity, unspecified: Secondary | ICD-10-CM | POA: Insufficient documentation

## 2015-06-27 NOTE — Patient Instructions (Addendum)
You will be enrolled in the [facility] bariatrics program today.  A multidisciplinary workup will be scheduled for you as outlined on your after visit summary.  You will be contacted to schedule your testing and for follow up.    Increase aerobic exercise to at least 150 minutes per week.  Keep a log of all exercise to bring with you to all visits.    Please make diet changes and exercise to lose as much as weight as possible prior to surgery, at least 10 pounds.    Recommendations:    Eat three small meals plus two protein based snacks during the day. Pre-plan your meals and stick with it. Document what you actually eat and compare the two. Use either pen and paper or an app like My Fitness Pal or Baritastic. Eat smaller portions; try using a smaller plate and small utensils to help encourage taking smaller bites. Remember, after surgery it will take you about 20-30 minutes to eat 1/2 cup to 1 cup of food. Do not drink beverages with calories. Avoid grazing and eating food out of a bag or pot/pan. Learn to read labels.

## 2015-06-27 NOTE — Progress Notes (Signed)
BARIATRIC CLINIC OUTPATIENT CONSULTATION    CHIEF COMPLAINT: New Patient Consult       HISTORY OF PRESENT ILLNESS:  Elleanna May Bartling is a 65 year old female referred by another physician: Fonnie Mu for evaluation of morbid obesity and for consideration for a surgical weight loss procedure. However, patient plans to find a new primary care provider as she is not happy with her care there. The patient has a height of Ht 5' 2.205" (1.58 m) and weight of Wt 195 lb 14.4 oz (88.86 kg) resulting in a Body mass index is 35.59 kg/m.Marland Kitchen The patient has attended one of our informational seminars where information on various weight loss procedures and obesity were discussed.  The patient is interested in a sleeve gastrectomy.  The patient has tried multiple methods of non-surgical weight loss in the past, including ATKINS, BEHAVIOR MODIFICATION, JENNY CRAIG , MODERATION, WEIGHT WATCHERS and [place].  She has been able to lose up to 15-20lbs with these attempts. Her highest weight is current at 195.  The patients exercise routine consists of walking 30 minutes almost every day. She also gardens and is active throughout her day.  The patient's dietary habits are significant for having a breakfast of 2 slices of toast with peanut butter and a banana or eggs and [place] bacon,etc. with coffee. She tries to have protein with every meal. She may have ten almonds with breakfast or as a mid morning snack. She may have a greek yogurt and crackers for a snack. Lunch can be a tuna sandwich with avocado or a BLT, etc. She may have hummus with multi-grain crackers. She may also have some carrots and celery and/or nuts. Mid afternoon snack may be some fruit and yogurt. Dinner can be salmon, salad 3-4 times per week, occasionally red meat, cod, chicken breasts grilled or pan fried. She rarely eats rice but will eat some potatoes. Vegetables frequently. After dinner she may have string cheese or hummus.  [place] eating meat, potatoes and dinner rolls. She continued to eat this way as a young adult. She started to change her diet in the past ten years or so as well as when she was diagnosed as a diabetic about 15 years ago. The patient feels that the major cause for weight gain is large portions, bread and insulin.    The patient suffers from several comorbid conditions associated with morbid obesity.  These include diabetes, hyperlipidemia, hypertension and GERD.  She indicates that she has undergone EGD testing in the past.  The patient doesn't confirm a history of gallbladder disease.      Outside records were reviewed.  Relevant workup to date is summarized below:  Labs: Date: TSH 2.032, Lipids  HDL 35, LDL 36, Triglycerides 116,CMP normal, HgbA1C 7.7.  Endoscopy:   EGD: Date: 2002, Findings: GERD per patient report  Colonoscopy: Date: 04/14/2012, Findings: diverticula, polyps and hemorrhoids.        Past Medical History:   Past Medical History:   Diagnosis Date   . Abnormal ECG    . Diabetes mellitus (HCC)    . Gastroesophageal reflux disease without esophagitis 03/30/2015   . Secondary hypertension 03/30/2015   . Thyroid disease       Patient Active Problem List   Diagnosis   . IDDM (insulin dependent diabetes mellitus) (HCC)   . Hyperlipidemia   . Gastroesophageal reflux disease without esophagitis   . Hypothyroidism   .  Secondary hypertension   . Abnormal EKG         Past Surgical History:   Past Surgical History:   Procedure Laterality Date   . COLONOSCOPY     . TOTAL ABDOM HYSTERECTOMY  1990        Family and Social History:   family history includes Diabetes in her brother, father, mother, and sister; Heart Attack in her paternal uncle.   Social History   Substance Use Topics   . Smoking status: Former Smoker     Packs/day: 1.00     Years: 10.00     Types: Cigarettes     Start date: 02/03/1978     Quit date: 02/04/1988   . Smokeless tobacco: Never Used   . Alcohol use No         Active Meds:   Current  Outpatient Prescriptions   Medication Sig Dispense Refill   . Aspirin 81 MG Oral Tab EC Take 81 mg by mouth daily.     . Atorvastatin Calcium 10 MG Oral Tab Take 1 tablet (10 mg) by mouth daily. 90 tablet 1   . Glucose Blood (ACCU-CHEK AVIVA) In Vitro Strip Use 1 strip 3 times a day. Use to check blood sugar. 300 strip 0   . Insulin NPH Isophane & Regular (NOVOLIN 70/30) (70-30) 100 UNIT/ML Subcutaneous Suspension Inject 80 Units under the skin. Inject 80 units twice daily before meals     . Insulin Syringe-Needle U-100 31G X 5/16" 1 ML Misc Use 1 syringe 2 times a day. Use with Novolin 70/30 twice daily. 200 syringe 0   . Levothyroxine Sodium 112 MCG Oral Cap Take 1 capsule (112 mcg) by mouth daily on an empty stomach. 90 capsule 1   . Lisinopril 40 MG Oral Tab Take 1 tablet (40 mg) by mouth daily. 90 tablet 1   . MetFORMIN HCl 1000 MG Oral Tab Take 1 tablet (1,000 mg) by mouth 2 times a day. 180 tablet 1   . Pantoprazole Sodium 40 MG Oral Tab EC Take 1 tablet (40 mg) by mouth daily. 90 tablet 1     No current facility-administered medications for this visit.          Allergies:   Review of patient's allergies indicates:  Allergies   Allergen Reactions   . Cats [Animals] Respiratory Distress     Wheezing, itchy eyes   . Pollen Extract Cough     Pollen and trees        Review of Systems:   Positive for IDDM, hypothyroidism,hyperlipidemia, HTN,GERD .  Full 14 system review of systems was reviewed with the patient and otherwise negative except as outlined above and noted in HPI.    STOP BANG today=4    Physical Exam:   PHYSICAL EXAM:  BP 157/77   Pulse 60   Temp 97.9 F (36.6 C) (Temporal)   Ht 5' 2.21" (1.58 m)   Wt 195 lb 14.4 oz (88.9 kg)   SpO2 97%   BMI 35.59 kg/m  Ht 5' 2.205" (1.58 m) Wt 195 lb 14.4 oz (88.86 kg) Body mass index is 35.59 kg/m.  GENERAL: well-developed, well-nourished, obese female, in no apparent distress.  HEENT: normocephalic, atraumatic head. oropharynx is clear without masses or  lesions noted. sclerae are anicteric. Pupils equal, round, and reactive to light and accommodation. Extraocular muscles are intact.  NECK: supple nontender neck and no palpable cervical lymphadenopathy.  RESPIRATORY: Lungs are clear to auscultation bilaterally; the patient has normal  respiratory effort on room air.  CARDIOVASCULAR: regular rate and rhythm. No murmurs, rubs, or gallops. The heart sounds are distant due to body habitus. Intact peripheral pulses  ABDOMEN: nontender, nondistended, obese abdomen. no palpable masses or organomegaly.  GU: no back/flank tenderness  SKIN: no rashes, bruises, no cyanosis, clubbing, or edema.  NEUROLOGIC: awake, alert, and oriented times 3. Normal gait and stance.  EXTREMITIES: warm and well perfused extremities, moves all extremities well.          ASSESSMENT:  Donnamarie PoagJeanne May Katrinka BlazingSmith is a 65 year old female who may be appropriate for a weight loss procedure. She eats well and exercises regularly yet has a difficult time losing weight. It is most likely related to portion sizes and she plans to start looking at this more closely. While she walks on a regular basis, this is less exercise than she has done in the past and she was encouraged to increase the aerobic level of her activity as she prepares for a possible surgery. She was enrolled with the following recommendations: Eat three small meals plus two protein based snacks during the day. Pre-plan your meals and stick with it. Document what you actually eat and compare the two. Use either pen and paper or an app like My Fitness Pal or Baritastic. Eat smaller portions; try using a smaller plate and small utensils to help encourage taking smaller bites. Remember, after surgery it will take you about 20-30 minutes to eat 1/2 cup to 1 cup of food. Do not drink beverages with calories. Avoid grazing and eating food out of a bag or pot/pan. Learn to read labels.    PLAN:  1. She has tried multiple methods of weight loss in the past and  has not had success.  She has attended one of our informational seminars and came today interested in sleeve gastrectomy but is now interested in a bypass following this conversation.  We had a lengthy discussion today where we went over the risks, benefits, expected outcomes, weight loss, and lifestyle changes associated with these operations. The risks discussed include but are not limited to bleeding, bruising, pain, infection, scarring, anastomotic leak, bleeding, stricture, MI, PE, stroke, infection, malnutrition, increased incidence of alcohol addiction and dumping syndrome.  Other complications can include bowel obstruction, chronic nausea and the need for prolonged nutritional supplementation.  The patient understands that these risks and complications can lead to readmission, reoperation, and even death.  The patient understood our discussion today and wishes to proceed.  We answered all questions in detail and to the patient's satisfaction.  The patient was accompanied by her grown daughter today at the time of our consultation.   2. Ardeen JourdainJeanne May Katrinka BlazingSmith will be enrolled in our program today and we will pursue a multidisciplinary workup to evaluate for candidacy of bariatric surgery.  This workup will consist of the following: screening lab work, PT/OT consult, social work consult, medicine consult for risk stratification, UGI, EGD, sleep apnea consultation and nutrition consult.    3. Once this workup is completed, the patient will return to our clinic for re-assessment by one of the surgeons.  At that time, based upon the results of this workup, we will make a decision on whether or not to pursue surgery.

## 2015-07-04 ENCOUNTER — Telehealth (HOSPITAL_BASED_OUTPATIENT_CLINIC_OR_DEPARTMENT_OTHER): Payer: Self-pay

## 2015-07-04 DIAGNOSIS — Z6834 Body mass index (BMI) 34.0-34.9, adult: Secondary | ICD-10-CM | POA: Diagnosis not present

## 2015-07-04 DIAGNOSIS — M545 Low back pain: Secondary | ICD-10-CM | POA: Diagnosis not present

## 2015-07-04 NOTE — Telephone Encounter (Signed)
(  TEXTING IS AN OPTION FOR [facility] CLINICS ONLY)  Is this a [facility] clinic? No      RETURN CALL: Detailed message on voicemail only      SUBJECT:  General Message     REASON FOR REQUEST: Patient requesting to schedule per referral.[facility] unable to connect to clinic per referral.Please call     MESSAGE: See above

## 2015-07-04 NOTE — Telephone Encounter (Signed)
Routing to [facility] to assist.

## 2015-07-04 NOTE — Telephone Encounter (Signed)
Clinic name and location: [facility]  Reason for sooner appointment: Patient's request   Patient's scheduling limitations: na  What clinics and/or providers have been offered: [facility]   Does the patient have a location preference ([facility], [facility], [facility] or [facility]): na  What is the best contact number: [phone]  What is the best time to call back: na  Is it ok to leave a detailed message on your voicemail or with someone: Yes

## 2015-07-05 ENCOUNTER — Telehealth (INDEPENDENT_AMBULATORY_CARE_PROVIDER_SITE_OTHER): Payer: Self-pay | Admitting: Family Medicine

## 2015-07-05 DIAGNOSIS — I159 Secondary hypertension, unspecified: Secondary | ICD-10-CM

## 2015-07-05 DIAGNOSIS — E039 Hypothyroidism, unspecified: Secondary | ICD-10-CM

## 2015-07-05 DIAGNOSIS — E785 Hyperlipidemia, unspecified: Secondary | ICD-10-CM

## 2015-07-05 DIAGNOSIS — E139 Other specified diabetes mellitus without complications: Secondary | ICD-10-CM

## 2015-07-05 DIAGNOSIS — IMO0001 Reserved for inherently not codable concepts without codable children: Secondary | ICD-10-CM

## 2015-07-05 DIAGNOSIS — K219 Gastro-esophageal reflux disease without esophagitis: Secondary | ICD-10-CM

## 2015-07-05 DIAGNOSIS — Z794 Long term (current) use of insulin: Secondary | ICD-10-CM

## 2015-07-05 MED ORDER — INSULIN NPH ISOPHANE & REGULAR (70-30) 100 UNIT/ML SC SUSP
80.0000 [IU] | Freq: Two times a day (BID) | SUBCUTANEOUS | 2 refills | Status: DC
Start: 2015-07-05 — End: 2015-12-18

## 2015-07-05 MED ORDER — PANTOPRAZOLE SODIUM 40 MG OR TBEC
40.0000 mg | DELAYED_RELEASE_TABLET | Freq: Every day | ORAL | 1 refills | Status: AC
Start: 2015-07-05 — End: ?

## 2015-07-05 MED ORDER — LEVOTHYROXINE SODIUM 112 MCG OR CAPS
112.0000 ug | ORAL_CAPSULE | Freq: Every day | ORAL | 1 refills | Status: AC
Start: 2015-07-05 — End: ?

## 2015-07-05 MED ORDER — ATORVASTATIN CALCIUM 10 MG OR TABS
10.0000 mg | ORAL_TABLET | Freq: Every day | ORAL | 1 refills | Status: AC
Start: 2015-07-05 — End: ?

## 2015-07-05 MED ORDER — LISINOPRIL 40 MG OR TABS
40.0000 mg | ORAL_TABLET | Freq: Every day | ORAL | 1 refills | Status: AC
Start: 2015-07-05 — End: ?

## 2015-07-05 MED ORDER — INSULIN SYRINGE-NEEDLE U-100 31G X 5/16" 1 ML MISC
1.0000 | Freq: Two times a day (BID) | 0 refills | Status: AC
Start: 2015-07-05 — End: ?

## 2015-07-05 NOTE — Telephone Encounter (Signed)
Called patient about a complaint she made and she insisted that she needed medication refills for 1 year; she has refills through August 2017; refills given  Will discuss insulin change with Nash Dimmerkerry - patient thinks that she does not need to be on insulin and they had discussed taking her off of it before the EKG was done

## 2015-07-05 NOTE — Telephone Encounter (Signed)
NPRP - patient ok to see Dr[facility] Virgina [facility]andell at [facility] for [facility] Medicine consult. Confirmed sooner appointment. TE closed.

## 2015-07-05 NOTE — Telephone Encounter (Signed)
(  TEXTING IS AN OPTION FOR [facility] CLINICS ONLY)  Is this a [facility] clinic? Yes. Patient declined the option to receive mobile text messages.      RETURN CALL: OK to leave detailed message with anyone that answers      SUBJECT:  Medication Management/Questions     MEDICATION(S):  Levothroxine   CONCERNS/QUESTIONS:  [facility] to see if she can change the medication from capsules to tablets. Patient is at the pharmacy. [facility] tried to warm transfer the call but the clinical staff was assisting other patient. Urgent per caller. Please call the patient back. Thank you.      ADDITIONAL INFORMATION: none

## 2015-07-05 NOTE — Telephone Encounter (Signed)
Dr. Eulas Postead-Williams stated OK to switch to tablet - called and notified pharmacy.     Closing TE.

## 2015-07-05 NOTE — Telephone Encounter (Signed)
Simple Message to Dr. Read-Williams.

## 2015-07-09 DIAGNOSIS — M5416 Radiculopathy, lumbar region: Secondary | ICD-10-CM | POA: Diagnosis not present

## 2015-07-09 DIAGNOSIS — M545 Low back pain: Secondary | ICD-10-CM | POA: Diagnosis not present

## 2015-07-09 DIAGNOSIS — M9903 Segmental and somatic dysfunction of lumbar region: Secondary | ICD-10-CM | POA: Diagnosis not present

## 2015-07-10 DIAGNOSIS — M5416 Radiculopathy, lumbar region: Secondary | ICD-10-CM | POA: Diagnosis not present

## 2015-07-10 DIAGNOSIS — M9903 Segmental and somatic dysfunction of lumbar region: Secondary | ICD-10-CM | POA: Diagnosis not present

## 2015-07-10 DIAGNOSIS — M545 Low back pain: Secondary | ICD-10-CM | POA: Diagnosis not present

## 2015-07-12 DIAGNOSIS — M9903 Segmental and somatic dysfunction of lumbar region: Secondary | ICD-10-CM | POA: Diagnosis not present

## 2015-07-12 DIAGNOSIS — M5416 Radiculopathy, lumbar region: Secondary | ICD-10-CM | POA: Diagnosis not present

## 2015-07-12 DIAGNOSIS — M545 Low back pain: Secondary | ICD-10-CM | POA: Diagnosis not present

## 2015-07-16 DIAGNOSIS — M5416 Radiculopathy, lumbar region: Secondary | ICD-10-CM | POA: Diagnosis not present

## 2015-07-16 DIAGNOSIS — M545 Low back pain: Secondary | ICD-10-CM | POA: Diagnosis not present

## 2015-07-16 DIAGNOSIS — M9903 Segmental and somatic dysfunction of lumbar region: Secondary | ICD-10-CM | POA: Diagnosis not present

## 2015-07-19 ENCOUNTER — Encounter (HOSPITAL_BASED_OUTPATIENT_CLINIC_OR_DEPARTMENT_OTHER): Payer: Medicare Other | Admitting: Registered"

## 2015-07-20 ENCOUNTER — Ambulatory Visit (INDEPENDENT_AMBULATORY_CARE_PROVIDER_SITE_OTHER): Payer: Medicare Other | Admitting: Internal Medicine

## 2015-07-20 VITALS — BP 143/79 | HR 74 | Temp 98.9°F | Resp 16 | Wt 195.0 lb

## 2015-07-20 DIAGNOSIS — G4733 Obstructive sleep apnea (adult) (pediatric): Secondary | ICD-10-CM

## 2015-07-20 MED ORDER — ZOLPIDEM TARTRATE 10 MG OR TABS
10.0000 mg | ORAL_TABLET | Freq: Every evening | ORAL | 0 refills | Status: AC | PRN
Start: 2015-07-20 — End: ?

## 2015-07-20 NOTE — Progress Notes (Signed)
Neck Circumference: 15 Inches

## 2015-07-20 NOTE — Progress Notes (Signed)
Sleep medicine consult  Chief Complaint: Sleepiness    History of Present Illness: [facility], for evaluation of sleepiness. Upcoming weight loss surgery    [name] goes to bed around 11PM, takes 5-10AM minutes to fall asleep, than wakes up at [facility] Danielle French wakes up 1 time a night, due to bladder.  She has no restless leg symptomatology. No hypnagogic or hypnopompic hallucination, sleep paralysis or cataplexy. She snores, at an intensity of 3/5, with ? witnessed pauses.   She does wake up without a dry mouth, without a sore throat, without nasal congestion. She wakes up with a morning headache. She rarely wakes up at least twice a night to urinate. She does not have GERD symptoms interferering with sleep. She does not have light headed symptoms when standing. She does not have cold extremities. She does not have nightmares. She does not grind her teeth. She does not sleep talk, and she does act out her dreams.    Danielle PoagJeanne May Katrinka French has no trouble awaking in the morning, and fatigue and sleepiness are rarely a problem for her. Her Epworth Sleepiness Scale is 6/24. She consider her general health to be good. Her weight is Wt 195 lbs.      Review of Symptoms:  Neurological: none  Heart: none    Review of patient's allergies indicates:  Allergies   Allergen Reactions   . Cats [Animals] Respiratory Distress     Wheezing, itchy eyes   . Pollen Extract Cough     Pollen and trees       Current Outpatient Prescriptions   Medication Sig Dispense Refill   . Aspirin 81 MG Oral Tab EC Take 81 mg by mouth daily.     . Atorvastatin Calcium 10 MG Oral Tab Take 1 tablet (10 mg) by mouth daily. 90 tablet 1   . Glucose Blood (ACCU-CHEK AVIVA) In Vitro Strip Use 1 strip 3 times a day. Use to check blood sugar. 300 strip 0   . Insulin NPH Isophane & Regular (NOVOLIN 70/30) (70-30) 100 UNIT/ML Subcutaneous Suspension Inject 80 Units under the skin 2 times a day before meals. Inject 80 units  twice daily before meals 10 mL 2   . Insulin Syringe-Needle U-100 31G X 5/16" 1 ML Misc Use 1 syringe 2 times a day. Use with Novolin 70/30 twice daily. 200 syringe 0   . Levothyroxine Sodium 112 MCG Oral Cap Take 1 capsule (112 mcg) by mouth daily on an empty stomach. 90 capsule 1   . Lisinopril 40 MG Oral Tab Take 1 tablet (40 mg) by mouth daily. 90 tablet 1   . MetFORMIN HCl 1000 MG Oral Tab Take 1 tablet (1,000 mg) by mouth 2 times a day. 180 tablet 1   . Pantoprazole Sodium 40 MG Oral Tab EC Take 1 tablet (40 mg) by mouth daily. 90 tablet 1   . Zolpidem Tartrate (AMBIEN) 10 MG Oral Tab Take 1 tablet (10 mg) by mouth at bedtime as needed. 1 tablet 0     No current facility-administered medications for this visit.        Family History   Problem Relation Age of Onset   . Diabetes Mother    . Diabetes Father    . Diabetes Brother    . Diabetes Sister    . Heart Attack Paternal Uncle        Patient Active Problem List   Diagnosis   .  IDDM (insulin dependent diabetes mellitus) (HCC)   . Hyperlipidemia   . Gastroesophageal reflux disease without esophagitis   . Hypothyroidism   . Secondary hypertension   . Abnormal EKG   . Obesity (BMI 35.0-39.9 without comorbidity)          Past Surgical History:   Procedure Laterality Date   . "tummy tuck"  1993   . BREAST SURGERY PROCEDURE UNLISTED     . COLONOSCOPY     . TOTAL ABDOM HYSTERECTOMY  1990       Past Medical History:   Diagnosis Date   . Abnormal ECG    . Diabetes mellitus (HCC)    . Gastroesophageal reflux disease without esophagitis 03/30/2015   . Lipidemia    . Obesity    . Secondary hypertension 03/30/2015   . Thyroid disease        Social History: Danielle French is married, with children. Work history: insurance Smoking history: quit. EtOH history: no. Recreational drug history: no. Caffeine history:1 coffee    Physical Examination:  Danielle French is pleasant, alert and orientated in no acute distress  BP 143/79   Pulse 74   Temp 98.9 F (37.2 C) (Temporal)    Resp 16   Wt 195 lb (88.5 kg)   BMI 35.43 kg/m  Body mass index is 35.43 kg/m. Neck: 15 inches  HEAD: Oral airway Malpatti class III airway    Assessment:   1-Likely sleep apnea. Pathophysiology of apnea discussed, and questions answered. Health consequences of untreated apnea discussed. Diagnostic and treatment options discussed.     Recommendations:  1-Split night sleep study, discussed vs OCST-cost and sensitivity discussed. She opts for in lab study and follow-up to discuss before therapy. UARS and compliance rules discussed  2-Follow-up in 2 months, sooner as needed  3-Danielle French is advised not to drive while drowsy. She understands this very important point.        Total time of approximately 25 minutes was spent face-to-face with the patient, of which more than 50% was spent [counseling] care as outlined in this note regarding use of CPAP including answering all questions and concerns about apnea and its health implications

## 2015-07-20 NOTE — Patient Instructions (Signed)
What to Expect After Your HOSPITAL Sleep Study      By now, you've received your sleeping pill for your overnight sleep study and are ready to call [phone] to schedule your study. Please call 48 hours after your visit today to call the sleep center, as they need to process your order before scheduling your sleep study appointment.    Reading Your Study: Please be aware that it will take about 10 days to read your hospital sleep study after you leave the sleep center.    Getting Your Equipment: If you test positive for sleep apnea we will call your CPAP Company with medical orders so that you can get your CPAP machine.  Please note that the speed with which you get your machine depends on the company and your insurance. These are private companies, and  the rate at which they process your order will vary.  While we don't control the speed of their operations and are unable to track the status of your order, if you are experiencing delays or other problems in obtaining your CPAP, please feel free to contact me and I will try to help. I will re-contact them and if you're still having problems, please call us back. Remember, I can't track your order in their system, so please call me again if your order is not resolved.    Following up: The standard follow-up timeframe is 6 weeks after you start using your CPAP.  Please don't forget to schedule this check-up appointment.  If you have questions after your study or before this 4-weel checkpoint, please schedule an appointment with me so that I can most effectively address your concerns.    Please note that some insurance companies with monitor how often you used your CPAP machine for the first 4-8 weeks. If you are having trouble adapting to your CPAP, please contact us for a follow-up appointment so that you do not risk losing your equipment.  For any type of follow-up appointment, it is important that you please remember to bring all of your CPAP equipment  (machine, power cords and mask)  to your visit so that it can be thoroughly checked and it efficacy evaluated.

## 2015-07-23 ENCOUNTER — Encounter (HOSPITAL_BASED_OUTPATIENT_CLINIC_OR_DEPARTMENT_OTHER): Payer: Medicare Other | Admitting: Clinical

## 2015-07-30 ENCOUNTER — Other Ambulatory Visit (HOSPITAL_COMMUNITY): Payer: Self-pay

## 2015-07-30 ENCOUNTER — Telehealth (HOSPITAL_BASED_OUTPATIENT_CLINIC_OR_DEPARTMENT_OTHER): Payer: Self-pay | Admitting: Surgery

## 2015-07-30 ENCOUNTER — Encounter (INDEPENDENT_AMBULATORY_CARE_PROVIDER_SITE_OTHER): Payer: Self-pay | Admitting: Cardiovascular Disease

## 2015-07-30 ENCOUNTER — Ambulatory Visit: Payer: Medicare Other | Attending: Cardiovascular Disease | Admitting: Cardiovascular Disease

## 2015-07-30 ENCOUNTER — Ambulatory Visit (INDEPENDENT_AMBULATORY_CARE_PROVIDER_SITE_OTHER): Payer: Medicare Other | Admitting: Cardiovascular Disease

## 2015-07-30 VITALS — BP 161/79 | HR 58 | Resp 16

## 2015-07-30 DIAGNOSIS — Z794 Long term (current) use of insulin: Secondary | ICD-10-CM | POA: Insufficient documentation

## 2015-07-30 DIAGNOSIS — R9431 Abnormal electrocardiogram [ECG] [EKG]: Secondary | ICD-10-CM | POA: Insufficient documentation

## 2015-07-30 DIAGNOSIS — I10 Essential (primary) hypertension: Secondary | ICD-10-CM

## 2015-07-30 DIAGNOSIS — I159 Secondary hypertension, unspecified: Secondary | ICD-10-CM | POA: Insufficient documentation

## 2015-07-30 DIAGNOSIS — E119 Type 2 diabetes mellitus without complications: Secondary | ICD-10-CM | POA: Insufficient documentation

## 2015-07-30 DIAGNOSIS — IMO0001 Reserved for inherently not codable concepts without codable children: Secondary | ICD-10-CM

## 2015-07-30 MED ORDER — HYDROCHLOROTHIAZIDE 25 MG OR TABS
25.0000 mg | ORAL_TABLET | Freq: Every day | ORAL | 3 refills | Status: AC
Start: 2015-07-30 — End: ?

## 2015-07-30 NOTE — Telephone Encounter (Signed)
Sharepoint Assessment Questions    HT: 5'2  WT: 195 LBS  BMI: 35.7    GENERAL QUESTIONS:  Does the patient have sufficient understanding of English? Yes  If NO, which language?    Is the patient able to provide consent? Yes (If no, fill in below)    Legal Guardian                                    DPOA:    Patient Assessment    Do you have an allergy to Fentanyl or Versed? No    Have you ever had any problems in the past with sedation (Different from General Anesthesia)? No    Do you use narcotics on a daily basis? No    Does you use oxygen at Home?  No    Are you taking any Anti-coagulant or Anti-Platelet Medications? No    Do you have any bleeding or coagulation disorders? No    Are you Diabetic?  Yes Type 2    Are you on dialysis? No    Do you have diagnosed sleep apnea? No    Do you currently have a diagnosis of Congestive Heart Failure or CHF that you are being treated for? No    Do you have a Pacemaker or Defibrillator? No    Are you a difficult IV start? No    Do you have any mobility issues that make it difficult to get onto a stretcher? No    Is this an Upper Endoscopy SEDATION Case (EGD, EUS, PEG)? Yes  If yes, are you able to open your mouth fully without any difficulty? Yes  If no, arranged for Anesthesia? Done      PROCEDURE        Procedure MD: Dr. Lenora BoysKhandelwal   Procedure Type: EGD  Procedure Date:  7/5       Time: 11:30 AM    Procedure Check-In Time: 10:30 AM    Patient Teaching  Who received the teaching - Patient? Yes    Was the topic of stopping iron supplements taught? Yes    Was the topic of blood thinners taught?  Yes    Was the topic of diet taught? Yes    Were instructions for taking current medications taught? Yes    Was our transportation policy taught? Yes    What is the name of their driver? Husband     How were the procedure preparation instruction materials delivered?  Verbal: Yes  On Paper: No  eCare message: No  Email: Yes    Does this procedure require bowel prep? No  If yes, were  instructions for the ordered laxative taught? No  If yes, which RX was prescribed? N/A.

## 2015-07-30 NOTE — Progress Notes (Signed)
CARDIOLOGY CLINIC FOLLOW UP NOTE    Name:  Danielle French  Date of Birth: 1950/04/09  Medical Record Number: [id number]  Primary Care Physician:  Pcp, Unknown  Date of Service: July 30, 2015    ID  Danielle French is a 65 year old female with history of diabetes mellitus , hypertension returns to clinic for routine follow-up of workup for abnormal EKG.    Chief Complaint/Reason for Visit  Follow-Up     Patient Active Problem List    Diagnosis Date Noted   . Obesity (BMI 35.0-39.9 without comorbidity) [E66.9] 06/27/2015   . Abnormal EKG [R94.31] 06/18/2015   . IDDM (insulin dependent diabetes mellitus) (HCC) [E11.9, Z79.4] 03/30/2015   . Hyperlipidemia [E78.5] 03/30/2015   . Gastroesophageal reflux disease without esophagitis [K21.9] 03/30/2015   . Hypothyroidism [E03.9] 03/30/2015   . Secondary hypertension [I15.9] 03/30/2015     Review of patient's allergies indicates:  Allergies   Allergen Reactions   . Cats [Animals] Respiratory Distress     Wheezing, itchy eyes   . Pollen Extract Cough     Pollen and trees     Current Outpatient Prescriptions   Medication Sig Dispense Refill   . Aspirin 81 MG Oral Tab EC Take 81 mg by mouth daily.     . Atorvastatin Calcium 10 MG Oral Tab Take 1 tablet (10 mg) by mouth daily. 90 tablet 1   . Glucose Blood (ACCU-CHEK AVIVA) In Vitro Strip Use 1 strip 3 times a day. Use to check blood sugar. 300 strip 0   . Insulin NPH Isophane & Regular (NOVOLIN 70/30) (70-30) 100 UNIT/ML Subcutaneous Suspension Inject 80 Units under the skin 2 times a day before meals. Inject 80 units twice daily before meals 10 mL 2   . Insulin Syringe-Needle U-100 31G X 5/16" 1 ML Misc Use 1 syringe 2 times a day. Use with Novolin 70/30 twice daily. 200 syringe 0   . Levothyroxine Sodium 112 MCG Oral Cap Take 1 capsule (112 mcg) by mouth daily on an empty stomach. 90 capsule 1   . Lisinopril 40 MG Oral Tab Take 1 tablet (40 mg) by mouth daily. 90 tablet 1   . MetFORMIN HCl 1000 MG Oral Tab Take 1 tablet  (1,000 mg) by mouth 2 times a day. 180 tablet 1   . Pantoprazole Sodium 40 MG Oral Tab EC Take 1 tablet (40 mg) by mouth daily. 90 tablet 1   . Zolpidem Tartrate (AMBIEN) 10 MG Oral Tab Take 1 tablet (10 mg) by mouth at bedtime as needed. 1 tablet 0     No current facility-administered medications for this visit.      Interval History  Patient states that she continues to feel well with good exercise tolerance.  She walks at least 30 minutes a day.  She is able to do this without significant dyspnea on exertion or chest discomfort.  She recently obtained a home blood pressure cuff which she is currently troubleshooting.  She states that previous blood pressure measurements at home have ranged as high as 140s systolic.  She recently [place] a provider in [place] and her systolic blood pressure at that time was 120s.  Patient denies chest pain, dyspnea on exertion, orthopnea, PND, edema, palpitations, lightheadedness, and syncope.      Review of Systems:  A complete review of systems was otherwise negative except as noted above    Family History   Problem Relation Age of Onset   . Diabetes  Mother    . Diabetes Father    . Diabetes Brother    . Diabetes Sister    . Heart Attack Paternal Uncle      Social History     Social History   . Marital status: N/A     Spouse name: N/A   . Number of children: N/A   . Years of education: N/A     Occupational History   . Not on file.     Social History Main Topics   . Smoking status: Former Smoker     Packs/day: 1.00     Years: 10.00     Types: Cigarettes     Start date: 02/03/1978     Quit date: 02/04/1988   . Smokeless tobacco: Never Used   . Alcohol use No   . Drug use: No   . Sexual activity: Yes     Partners: Male     Other Topics Concern   . 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       Physical Exam:  BP 161/79   Pulse 58   Resp 16   SpO2 97%      CONSTITUTIONAL: In NAD  HEENT: Normocephalic, atraumatic, no scleral icterus or conjunctival pallor, good dentition, no erythema or exudate of oropharynx  CV:  RRR with normal S1 and S2, no S3 or S4, no murmurs or rubs, Nondisplaced PMI, carotid volume is full with brisk upstroke. Radial, femoral and DP pulses are 2+. No edema, JVP 6 cmH2O.  RESP: Clear to auscultation x2 no rales  GI: +BS, soft, nontender, nondistended  MUSCULOSKELETAL: no muscle atrophy or acute bony abnormality  SKIN: warm, dry, free of rash or ulceration  NEURO: AOx3, normal gait, full affect, with good insight into medical condition    Laboratory Results:   Results for orders placed or performed in visit on 04/27/15   A1C RAPID, ONSITE   Result Value Ref Range    Hemoglobin A1C 7.7 (H) 4.0 - 6.0 %   COMPREHENSIVE METABOLIC PANEL   Result Value Ref Range    Sodium 137 135 - 145 meq/L    Potassium 4.4 3.6 - 5.2 meq/L    Chloride 102 98 - 108 meq/L    Carbon Dioxide, Total 27 22 - 32 meq/L    Anion Gap 8 4 - 12    Glucose 214 (H) 62 - 125 mg/dL    Urea Nitrogen 10 8 - 21 mg/dL    Creatinine 1.610.70 0.960.38 - 1.02 mg/dL    Protein (Total) 6.9 6.0 - 8.2 g/dL    Albumin 4.1 3.5 - 5.2 g/dL    Bilirubin (Total) 0.4 0.2 - 1.3 mg/dL    Calcium 9.8 8.9 - 04.510.2 mg/dL    AST (GOT) 33 9 - 38 U/L    Alkaline Phosphatase (Total) 64 38 - 172 U/L    ALT (GPT) 38 (H) 7 - 33 U/L    GFR, Calc, European American >60 mL/min/[1.73_m2]    GFR, Calc, African American >60 mL/min/[1.73_m2]    GFR, Information       Calculated GFR in mL/min/1.73 m2 by MDRD equation.  Inaccurate with changing renal function.  See http://depts.[employer].edu/labweb/test/bclim/cGFR.html   LIPID PANEL   Result Value Ref Range    Cholesterol (Total) 94 <200 mg/dL    Triglyceride 409116 <811<150 mg/dL  Cholesterol (HDL) 35 (L) >39 mg/dL    Cholesterol (LDL) 36 <130 mg/dL    Non-HDL Cholesterol 59 0 - 159 mg/dL    Cholesterol/HDL Ratio 2.7     Lipid Panel, Additional Info. (NOTE)    THYROID STIMULATING  HORMONE   Result Value Ref Range    Thyroid Stimulating Hormone 2.032 0.400 - 5.000 u[IU]/mL     No results found for: WBC, HGB, NA, K, INR, LDH  Wt Readings from Last 3 Encounters:   07/20/15 195 lb (88.5 kg)   06/27/15 195 lb 14.4 oz (88.9 kg)   05/28/15 195 lb (88.5 kg)     BP Readings from Last 3 Encounters:   07/30/15 161/79   07/20/15 143/79   06/27/15 157/77     Results for orders placed or performed in visit on 04/27/15   LIPID PANEL   Result Value Ref Range    Cholesterol (Total) 94 <200 mg/dL    Triglyceride 454 <098 mg/dL    Cholesterol (HDL) 35 (L) >39 mg/dL    Cholesterol (LDL) 36 <130 mg/dL    Non-HDL Cholesterol 59 0 - 159 mg/dL    Cholesterol/HDL Ratio 2.7     Lipid Panel, Additional Info. (NOTE)        Imaging:  Nuclear treadmill stress test (01/08/2011): Tracing reviewed.  Patient exercised for 9 minutes according to Bruce protocol.  Achieving 89% of maximum predicted heart rate.  Stress EKG was negative for ischemia.    Echocardiogram (07/30/2015): Normal left ventricular size and systolic function.  No wall motion abnormalities.  No valvular abnormalities.    Imaging test discussed with patient.    Assessment/Plan:   65 year old woman with history of diabetes, hypertension, and abnormal EKG he returns to clinic for follow-up.    Abnormal EKG - patient with evidence of previous infarct on EKG.  However, this is not supported by stress test results in 2012 or echocardiogram performed today prior to clinic.  Based on these findings I think the EKG is a false positive finding.    Preoperative cardiovascular risk assessment - patient is considering bariatric surgery.  Based on my assessment of the patient I believe she is at low risk for cardiovascular event for this intermediate risk procedure.  She has no evidence of active coronary artery disease, significant valvular heart disease, malignant arrhythmias, and no evidence of heart failure.  No further testing or changes in her medical management are  indicated prior to planned surgery.    Hypertension - patient is again hypertensive today in clinic.  She is on lisinopril 40 mg by mouth daily.  I will add a second agent in the form of hydrochlorothiazide.  Goal systolic blood pressure less than 130.    Author:  Oren Section, MD  Attending Physician, Division of Cadiology  [facility]  [place], [place]

## 2015-07-30 NOTE — Progress Notes (Signed)
Study ID: [id number]     [facility]  [address]  [place], [place] [id number]      Transthoracic Echo Report  Patient Name: Danielle French  MRN: [id number]  Study Date: 07/30/2015 01:35 PM  Patient Class: Outpatient    DOB: 1950-11-28  Age: 65 yrs  Height: 62 in  Weight: 195 lb     BP: 110/70 mmHg  HR: 56  BSA: 1.689m2   Service: [facility]    Gender: Female    Portable: N  Equipment: Sequoia 7           History: 65 year old with history of DM, tobacco use and abnormal EKG.      Study Quality: Fair          CPT Codes:  93306/04830022: Transthoracic Echo with Spectral and Color Doppler.          CONCLUSIONS:  Left ventricular size and function are normal with no regional wall motion abnormalities.     All valves are structurally normal with no significant dysfunction.      PROCEDURE NOTES:   The patient was comfortable and cooperative throughout the procedure. The underlying rhythm was sinus.     FINDINGS:   Left Ventricle:  The left ventricle is normal in size. There is normal left ventricular wall thickness. Global left ventricular function is normal. No regional wall motion abnormalities are present. The overall diastolic pattern is one of normal left ventricular relaxation and filling pressures.   Right Ventricle:  The right ventricle is normal size. The basal right ventricular diameter measured from a right ventricular focused view is 3.6 cm. The right ventricular systolic function is normal. The tricuspid annular plane systolic excursion (TAPSE) measurement is 3.0 cm. The basal right ventricular myocardium systolic velocity is 15.5 cm/s. No regional wall motion abnormalities are noted.   Aortic Valve:  The aortic valve is trileaflet. The aortic valve motion is normal. No aortic regurgitation is present.   Mitral Valve:  The mitral valve leaflets are structurally normal with normal motion. There is trace mitral regurgitation.   Tricuspid Valve:  The tricuspid valve is structurally normal. Trace  tricuspid regurgitation present.   Pulmonic Valve:  The pulmonic valve is not well seen. There is no pulmonic valvular regurgitation.   Left Atrium:  The left atrial size is normal. The left atrial volume indexed to body surface area is at 25 ml/m2. This refers to the maximal volume measured prior to mitral valve opening.   Right Atrium:  Right atrial size is normal. The inferior vena cava appears normal.   Atrial Septum:  The interatrial septum appears normal, without evidence of shunt by 2D imaging and color Doppler.   Aorta:  The sinuses of Valsalva are normal in size. The ascending aorta is normal in size. The diameter of the ascending aorta is 3.2 cm.   Pulmonary Artery:  The pulmonary artery is normal size. The right ventricular systolic pressure is normal.   Pericardium/Pleural Space:  There is no pericardial effusion.     ECHO DIMENSIONS:              IVSd: 1.1cm     (F: 0.6-0.9 / M: 0.6-1.0)  LA dimension: 3.3cm     (F: 2.7-3.8 / M: 3.0-4.0)        IVSs: 1.4cm           Ao root diam: 2.8cm     (F: 2.1-2.5 / M: 2.3-2.9)    LVIDd: 4.1cm     (  F: 3.8-5.2 / M: 4.2-5.8)            LVIDs: 2.1cm     (F: 2.2-3.5 / M: 2.5-4.0)            LVPWd: 0.88cm     (F: 0.6-0.9 / M: 0.6-1.0)            LVPWs: 1.1cm           asc Aorta Diam: 3.2cm         FS: 49.3%     (F: 27-45 / M: 25-43)                 DOPPLER FINDINGS:                  LVOT max: 105.4cm/sec       AoV max: 157.0cm/sec                 TR max vel: 206.2cm/sec       RAP systole: 5.770mmHg       RVSP(TR): 22.50mmHg             PV max: 106.0cm/sec       PV max PG: 4.245mmHg                                   DIASTOLIC FUNCTION:                  MV E max vel: 113.7cm/sec               Lat Peak E' Vel: 13.2cm/sec         MV A max vel: 108.2cm/sec               Med Peak E' Vel: 8.4cm/sec         MV E/A: 1.1           LV IVRT: 0.07sec       E/e' med: 13.5          MV A dur: 0.13sec               E/e' lat: 8.6         MV dec time: 0.21sec               E/e': 10.5               MMode/2D Measurements & Calculations  LAV(MOD-bp): 46.513ml   LAV(MOD-bp) index: 24.255ml/m2    LAV(MOD-sp2): 41.665ml    LAV(MOD-sp4): 51.502ml      Doppler Measurements & Calculations  AoV max PG (full): 5.44mmHg    LVOT max PG: 4.284mmHg      Attending Physician Signing Statement:  I have personally reviewed these images. I agree with findings and conclusions as documented.   Interpreting Attending Physician:    Electronically Authenticated by Danielle French [id number] on06/26/2017 03:05 PM Referring Attending Physician: Danielle French  Cardiac Sonographer: Danielle French    Patient Name: Danielle French   MRN: [id number] [facility]  [facility]  [facility]   [facility]  [place], [place]

## 2015-07-30 NOTE — Progress Notes (Deleted)
CARDIOLOGY CLINIC FOLLOW UP NOTE    Name:  Danielle French  Date of Birth: 1950/04/18  Medical Record Number: [id number]  Primary Care Physician:  Pcp, Unknown  Date of Service: July 30, 2015    ID  Danielle French is a 65 year old female ***     Chief Complaint/Reason for Visit  Follow-Up     Patient Active Problem List    Diagnosis Date Noted   . Obesity (BMI 35.0-39.9 without comorbidity) [E66.9] 06/27/2015   . Abnormal EKG [R94.31] 06/18/2015   . IDDM (insulin dependent diabetes mellitus) (HCC) [E11.9, Z79.4] 03/30/2015   . Hyperlipidemia [E78.5] 03/30/2015   . Gastroesophageal reflux disease without esophagitis [K21.9] 03/30/2015   . Hypothyroidism [E03.9] 03/30/2015   . Secondary hypertension [I15.9] 03/30/2015     Review of patient's allergies indicates:  Allergies   Allergen Reactions   . Cats [Animals] Respiratory Distress     Wheezing, itchy eyes   . Pollen Extract Cough     Pollen and trees     Current Outpatient Prescriptions   Medication Sig Dispense Refill   . Aspirin 81 MG Oral Tab EC Take 81 mg by mouth daily.     . Atorvastatin Calcium 10 MG Oral Tab Take 1 tablet (10 mg) by mouth daily. 90 tablet 1   . Glucose Blood (ACCU-CHEK AVIVA) In Vitro Strip Use 1 strip 3 times a day. Use to check blood sugar. 300 strip 0   . Insulin NPH Isophane & Regular (NOVOLIN 70/30) (70-30) 100 UNIT/ML Subcutaneous Suspension Inject 80 Units under the skin 2 times a day before meals. Inject 80 units twice daily before meals 10 mL 2   . Insulin Syringe-Needle U-100 31G X 5/16" 1 ML Misc Use 1 syringe 2 times a day. Use with Novolin 70/30 twice daily. 200 syringe 0   . Levothyroxine Sodium 112 MCG Oral Cap Take 1 capsule (112 mcg) by mouth daily on an empty stomach. 90 capsule 1   . Lisinopril 40 MG Oral Tab Take 1 tablet (40 mg) by mouth daily. 90 tablet 1   . MetFORMIN HCl 1000 MG Oral Tab Take 1 tablet (1,000 mg) by mouth 2 times a day. 180 tablet 1   . Pantoprazole Sodium 40 MG Oral Tab EC Take 1 tablet (40 mg) by  mouth daily. 90 tablet 1   . Zolpidem Tartrate (AMBIEN) 10 MG Oral Tab Take 1 tablet (10 mg) by mouth at bedtime as needed. 1 tablet 0     No current facility-administered medications for this visit.      Interval History  ***    Review of Systems:  A complete review of systems was otherwise negative except as noted above    Family History   Problem Relation Age of Onset   . Diabetes Mother    . Diabetes Father    . Diabetes Brother    . Diabetes Sister    . Heart Attack Paternal Uncle      Social History     Social History   . Marital status: N/A     Spouse name: N/A   . Number of children: N/A   . Years of education: N/A     Occupational History   . Not on file.     Social History Main Topics   . Smoking status: Former Smoker     Packs/day: 1.00     Years: 10.00     Types: Cigarettes     Start  date: 02/03/1978     Quit date: 02/04/1988   . Smokeless tobacco: Never Used   . Alcohol use No   . Drug use: No   . Sexual activity: Yes     Partners: Male     Other Topics Concern   . 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       Physical Exam:  BP 161/79   Pulse 58   Resp 16   SpO2 97%   ***  CONSTITUTIONAL: In NAD  HEENT: Normocephalic, atraumatic, no scleral icterus or conjunctival pallor, good dentition, no erythema or exudate of oropharynx  Neck: supple with normal thyroid size  CV: *** RRR with normal S1 and S2, no S3 or S4, no murmurs or rubs, Nondisplaced PMI, carotid volume is full with brisk upstroke. Radial, femoral and DP pulses are 2+. No edema, JVP *** cmH2O.  RESP: Clear to auscultation x2 no rales  GI: +BS, soft, nontender, nondistended  MUSCULOSKELETAL: no muscle atrophy or acute bony abnormality  HEME/LYMPY/IMMUNOLOGIC: no lymphadenopathy  SKIN: warm, dry, free of rash or ulceration  NEURO: AOx3, normal gait, full affect, with good insight into medical condition    Laboratory Results:    Results for orders placed or performed in visit on 04/27/15   A1C RAPID, ONSITE   Result Value Ref Range    Hemoglobin A1C 7.7 (H) 4.0 - 6.0 %   COMPREHENSIVE METABOLIC PANEL   Result Value Ref Range    Sodium 137 135 - 145 meq/L    Potassium 4.4 3.6 - 5.2 meq/L    Chloride 102 98 - 108 meq/L    Carbon Dioxide, Total 27 22 - 32 meq/L    Anion Gap 8 4 - 12    Glucose 214 (H) 62 - 125 mg/dL    Urea Nitrogen 10 8 - 21 mg/dL    Creatinine 1.610.70 0.960.38 - 1.02 mg/dL    Protein (Total) 6.9 6.0 - 8.2 g/dL    Albumin 4.1 3.5 - 5.2 g/dL    Bilirubin (Total) 0.4 0.2 - 1.3 mg/dL    Calcium 9.8 8.9 - 04.510.2 mg/dL    AST (GOT) 33 9 - 38 U/L    Alkaline Phosphatase (Total) 64 38 - 172 U/L    ALT (GPT) 38 (H) 7 - 33 U/L    GFR, Calc, European American >60 mL/min/[1.73_m2]    GFR, Calc, African American >60 mL/min/[1.73_m2]    GFR, Information       Calculated GFR in mL/min/1.73 m2 by MDRD equation.  Inaccurate with changing renal function.  See http://depts.[employer].edu/labweb/test/bclim/cGFR.html   LIPID PANEL   Result Value Ref Range    Cholesterol (Total) 94 <200 mg/dL    Triglyceride 409116 <811<150 mg/dL    Cholesterol (HDL) 35 (L) >39 mg/dL    Cholesterol (LDL) 36 <130 mg/dL    Non-HDL Cholesterol 59 0 - 159 mg/dL    Cholesterol/HDL Ratio 2.7     Lipid Panel, Additional Info. (NOTE)    THYROID STIMULATING HORMONE   Result Value Ref Range    Thyroid Stimulating Hormone 2.032 0.400 - 5.000 u[IU]/mL     No results found for: WBC, HGB, NA, K, INR, LDH  Wt Readings from Last 3 Encounters:   07/20/15 195 lb (88.5 kg)   06/27/15 195 lb 14.4 oz (88.9 kg)   05/28/15 195 lb (  88.5 kg)     BP Readings from Last 3 Encounters:   07/30/15 161/79   07/20/15 143/79   06/27/15 157/77     Results for orders placed or performed in visit on 04/27/15   LIPID PANEL   Result Value Ref Range    Cholesterol (Total) 94 <200 mg/dL    Triglyceride 161116 <096<150 mg/dL    Cholesterol (HDL) 35 (L) >39 mg/dL    Cholesterol (LDL) 36 <130 mg/dL    Non-HDL Cholesterol 59 0  - 159 mg/dL    Cholesterol/HDL Ratio 2.7     Lipid Panel, Additional Info. (NOTE)        Imaging:  ***    Labs discussed with patient.     Assessment/Plan:   ***    Total time of >*** minutes was spent face-to-face with the patient, of which more than 50% was spent counseling and coordinating care as outlined in this note.    There are no Patient Instructions on file for this visit.  Author:  Oren SectionGregory A Mohmmad Saleeby, MD  Attending Physician, Division of Cadiology  [facility]  [place], [place]

## 2015-08-03 ENCOUNTER — Telehealth: Payer: Self-pay | Admitting: Internal Medicine

## 2015-08-03 ENCOUNTER — Ambulatory Visit (HOSPITAL_BASED_OUTPATIENT_CLINIC_OR_DEPARTMENT_OTHER): Payer: Medicare Other | Attending: Sleep Medicine

## 2015-08-03 DIAGNOSIS — G4733 Obstructive sleep apnea (adult) (pediatric): Secondary | ICD-10-CM

## 2015-08-03 NOTE — Telephone Encounter (Signed)
Called pharmacy to confirm if they received this when prescribed on 07/20/2015. [facility]. Patient notified. Closing TE.

## 2015-08-03 NOTE — Telephone Encounter (Signed)
(  TEXTING IS AN OPTION FOR [facility] CLINICS ONLY)  Is this a [facility] clinic? Yes. What is the mobile number we can use to get a hold of you via text? [phone]      RETURN CALL: Detailed message on voicemail only      SUBJECT:  Refill Request    MEDICATION(S): ambien  NEEDED BY: 08/03/15  PRESCRIBING PROVIDER: Virgina JockMandell  PHARMACY NAME AND LOCATION: [facility] [id number] [place] [phone]   ADDITIONAL INFORMATION: [facility]. CCR explained that controlled substances cannot be filled this way. Patient would like to avoid coming into the [place] clinic for pick up if at all possible. Please call back to discuss logistics.

## 2015-08-06 ENCOUNTER — Ambulatory Visit: Payer: PRIVATE HEALTH INSURANCE

## 2015-08-08 ENCOUNTER — Ambulatory Visit (HOSPITAL_COMMUNITY): Payer: Medicare Other | Admitting: Surgery

## 2015-08-08 ENCOUNTER — Ambulatory Visit
Admission: RE | Admit: 2015-08-08 | Discharge: 2015-08-08 | Disposition: A | Discharge status: Home / Self Care | Payer: Medicare Other | Attending: Surgery | Admitting: Surgery

## 2015-08-08 ENCOUNTER — Ambulatory Visit (HOSPITAL_BASED_OUTPATIENT_CLINIC_OR_DEPARTMENT_OTHER): Payer: Medicare Other | Admitting: Surgery

## 2015-08-08 DIAGNOSIS — Z5309 Procedure and treatment not carried out because of other contraindication: Secondary | ICD-10-CM | POA: Insufficient documentation

## 2015-08-08 LAB — GLUCOSE POC, ~~LOC~~: Glucose (POC): 193 mg/dL — ABNORMAL HIGH (ref 62–125)

## 2015-08-10 ENCOUNTER — Encounter (HOSPITAL_BASED_OUTPATIENT_CLINIC_OR_DEPARTMENT_OTHER): Payer: Medicare Other | Admitting: Family

## 2015-08-14 ENCOUNTER — Telehealth (INDEPENDENT_AMBULATORY_CARE_PROVIDER_SITE_OTHER): Payer: Self-pay | Admitting: Internal Medicine

## 2015-08-14 ENCOUNTER — Encounter (HOSPITAL_BASED_OUTPATIENT_CLINIC_OR_DEPARTMENT_OTHER): Payer: Medicare Other | Admitting: Unknown Physician Specialty

## 2015-08-14 DIAGNOSIS — M545 Low back pain: Secondary | ICD-10-CM | POA: Diagnosis not present

## 2015-08-14 DIAGNOSIS — M5416 Radiculopathy, lumbar region: Secondary | ICD-10-CM | POA: Diagnosis not present

## 2015-08-14 DIAGNOSIS — M5126 Other intervertebral disc displacement, lumbar region: Secondary | ICD-10-CM | POA: Diagnosis not present

## 2015-08-14 NOTE — Telephone Encounter (Signed)
Burnis MedinMandell, Randy Brian, MD  [facility]; Felipa FurnaceGarcia, [place] 29 minutes ago (11:51 AM)      She has severe sleep apnea, please offer ov to discuss. Please send te back to me when done. (Routing comment)        Called and LM for Pt to call back in to review above.   Will try Pt again later today.     .CCR:  If patient returns phone call, please transfer call to an [place] medical assistant at extension [phone]. If no one is available to answer, please take a message and forward the TE to the [facility] Clinical Staff Pool.

## 2015-08-15 NOTE — Telephone Encounter (Signed)
Called and LM for Pt to call back in to review below. 3rd Attempt.     Dr. Virgina JockMandell, 3 attempts made, unable to reach Pt, how would you like to proceed?     .CCR:  If patient returns phone call, please transfer call to an [place] medical assistant at extension [phone]. If no one is available to answer, please take a message and forward the TE to the [facility] Clinical Staff Pool.

## 2015-08-15 NOTE — Telephone Encounter (Signed)
Burnis MedinMandell, Randy Brian, MD  You; [facility]; Virgina JockMandell, Remo Lippsandy Brian, MD 2 minutes ago (3:04 PM)      Please send letter, e-care message. This is important (Routing comment)          Pt is not set up with eCare.     Letter mailed.    Routing to Dr. Virgina JockMandell as Lorain ChildesFYI.     Closing TE.

## 2015-08-15 NOTE — Telephone Encounter (Signed)
Called and LM for Pt to call back in to review below and assist in scheduling.   Will try Pt again later today for 3rd Attempt.     .CCR:  If patient returns phone call, please transfer call to an [place] medical assistant at extension [phone]. If no one is available to answer, please take a message and forward the TE to the [facility] Clinical Staff Pool.

## 2015-08-16 ENCOUNTER — Telehealth: Payer: Self-pay | Admitting: Family Medicine

## 2015-08-16 NOTE — Telephone Encounter (Signed)
Please review-aa 

## 2015-08-16 NOTE — Telephone Encounter (Signed)
Pt would like to know how to get the process started to have her bone marrow tested to see if she is able to be a donor.

## 2015-08-16 NOTE — Telephone Encounter (Signed)
What is this  for? If she wants to be tested it would probably be through the recipient's doctor's. They would have the information on who in how to get tested.

## 2015-08-17 ENCOUNTER — Encounter (HOSPITAL_BASED_OUTPATIENT_CLINIC_OR_DEPARTMENT_OTHER): Payer: Self-pay | Admitting: Surgery

## 2015-08-17 NOTE — Telephone Encounter (Signed)
Pt informed

## 2015-08-21 ENCOUNTER — Telehealth (INDEPENDENT_AMBULATORY_CARE_PROVIDER_SITE_OTHER): Payer: Self-pay | Admitting: Internal Medicine

## 2015-08-21 NOTE — Telephone Encounter (Signed)
(  TEXTING IS AN OPTION FOR [facility] CLINICS ONLY)  Is this a [facility] clinic? Yes. What is the mobile number we can use to get a hold of you via text? [phone]      RETURN CALL: OK to leave detailed message with anyone that answers      SUBJECT:  Results Request     TEST ORDERED BY: Dr. Virgina JockMandell  TYPE OF TEST? Other Sleep Study  [facility]  DATE OF TEST: 08/03/15  ADDITIONAL INFORMATION: Patient calling to see if able to speak to provider or clinical staff about sleep study results

## 2015-08-21 NOTE — Telephone Encounter (Signed)
1st attempt, someone answered phone but did not reply when I spoke.   Will try patient again later today.    CCR:  If patient returns phone call, please transfer call to an [facility] medical assistant at extension [phone]. If no one is available to answer, please take a message and forward the TE to the [facility] Clinical Staff Pool.

## 2015-08-22 NOTE — Telephone Encounter (Signed)
Called and spoke to Pt and relayed message from Dr. Virgina JockMandell.     Pt scheduled for 08/23/15.     Closing TE.

## 2015-08-22 NOTE — Telephone Encounter (Signed)
Burnis MedinMandell, Randy Brian, MD   to [facility] . Felipa FurnaceGarcia, Jesus          11:51 AM   She has severe sleep apnea, please offer ov to discuss. Please send te back to me when done.

## 2015-08-23 ENCOUNTER — Encounter (INDEPENDENT_AMBULATORY_CARE_PROVIDER_SITE_OTHER): Payer: Self-pay | Admitting: Internal Medicine

## 2015-08-23 ENCOUNTER — Ambulatory Visit (INDEPENDENT_AMBULATORY_CARE_PROVIDER_SITE_OTHER): Payer: Medicare Other | Admitting: Internal Medicine

## 2015-08-23 ENCOUNTER — Telehealth (INDEPENDENT_AMBULATORY_CARE_PROVIDER_SITE_OTHER): Payer: Self-pay | Admitting: Internal Medicine

## 2015-08-23 VITALS — BP 133/80 | HR 70 | Temp 98.2°F | Resp 16 | Ht 62.21 in | Wt 195.0 lb

## 2015-08-23 DIAGNOSIS — Z6835 Body mass index (BMI) 35.0-35.9, adult: Secondary | ICD-10-CM

## 2015-08-23 DIAGNOSIS — G4733 Obstructive sleep apnea (adult) (pediatric): Secondary | ICD-10-CM

## 2015-08-23 NOTE — Telephone Encounter (Signed)
Rx fax to [facility] today -TE CLOSED

## 2015-08-23 NOTE — Progress Notes (Signed)
Danielle MarekJeanne Mae French  Chief Complaint   Patient presents with   . Sleep Problem     sleep study follow up     HPI: She has severe apnea. Here to discuss. She has distant plan for weight loss surgery. She is here to discuss study and treatment options.  Past Medical History:   Diagnosis Date   . Abnormal ECG    . Diabetes mellitus (HCC)    . Gastroesophageal reflux disease without esophagitis 03/30/2015   . Lipidemia    . Obesity    . Secondary hypertension 03/30/2015   . Thyroid disease      Current Outpatient Prescriptions   Medication Sig Dispense Refill   . Aspirin 81 MG Oral Tab EC Take 81 mg by mouth daily.     . Atorvastatin Calcium 10 MG Oral Tab Take 1 tablet (10 mg) by mouth daily. 90 tablet 1   . Glucose Blood (ACCU-CHEK AVIVA) In Vitro Strip Use 1 strip 3 times a day. Use to check blood sugar. 300 strip 0   . HydroCHLOROthiazide 25 MG Oral Tab Take 1 tablet (25 mg) by mouth daily. 90 tablet 3   . Insulin NPH Isophane & Regular (NOVOLIN 70/30) (70-30) 100 UNIT/ML Subcutaneous Suspension Inject 80 Units under the skin 2 times a day before meals. Inject 80 units twice daily before meals 10 mL 2   . Insulin Syringe-Needle U-100 31G X 5/16" 1 ML Misc Use 1 syringe 2 times a day. Use with Novolin 70/30 twice daily. 200 syringe 0   . Levothyroxine Sodium 112 MCG Oral Cap Take 1 capsule (112 mcg) by mouth daily on an empty stomach. 90 capsule 1   . Lisinopril 40 MG Oral Tab Take 1 tablet (40 mg) by mouth daily. 90 tablet 1   . MetFORMIN HCl 1000 MG Oral Tab Take 1 tablet (1,000 mg) by mouth 2 times a day. 180 tablet 1   . Pantoprazole Sodium 40 MG Oral Tab EC Take 1 tablet (40 mg) by mouth daily. 90 tablet 1   . Zolpidem Tartrate (AMBIEN) 10 MG Oral Tab Take 1 tablet (10 mg) by mouth at bedtime as needed. 1 tablet 0     No current facility-administered medications for this visit.      Social History     Social History   . Marital status: Married     Spouse name: N/A   . Number of children: N/A   . Years of education:  N/A     Occupational History   . Not on file.     Social History Main Topics   . Smoking status: Former Smoker     Packs/day: 1.00     Years: 10.00     Types: Cigarettes     Start date: 02/03/1978     Quit date: 02/04/1988   . Smokeless tobacco: Never Used   . Alcohol use No   . Drug use: No   . Sexual activity: Yes     Partners: Male     Other Topics Concern   . 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     Review of patient's allergies indicates:  Allergies   Allergen Reactions   . Cats [Animals] Respiratory Distress     Wheezing, itchy eyes   . Pollen Extract Cough     Pollen  and trees     Past Surgical History:   Procedure Laterality Date   . "tummy tuck"  1993   . BREAST SURGERY PROCEDURE UNLISTED     . COLONOSCOPY     . TOTAL ABDOM HYSTERECTOMY  1990     Patient Active Problem List   Diagnosis   . IDDM (insulin dependent diabetes mellitus) (HCC)   . Hyperlipidemia   . Gastroesophageal reflux disease without esophagitis   . Hypothyroidism   . Secondary hypertension   . Abnormal EKG   . Obesity (BMI 35.0-39.9 without comorbidity)     ROS:  COR: No chest pain    EXAM  Vitals:    08/23/15 1200 08/23/15 1216   BP: 160/78 133/80   Pulse: 70    Resp: 16    Temp: 98.2 F (36.8 C)    TempSrc: Temporal    SpO2: 96%    Weight: 195 lb (88.5 kg)    Height: 5' 2.21" (1.58 m)        1. Obstructive apnea  Discussed study, AHI 47 desat index 33.4 nadir 74% 10.8% of night below 90. Discussed health risks, treatment options, and compliance rules. Rx APAP order. She is traveling and back in town 7/26-8/3. She will use CPAP nightly, advised to never drive if drowsy and sleep med follow-up in 6 weeks (after starting CPAP-bring CPAP, mask and power cords to appointment.

## 2015-08-23 NOTE — Patient Instructions (Signed)
It was a pleasure to see you in clinic today.                If you are not yet signed up for eCare, please see the below eCare section at the end of this document for how to enroll and to get your access codes.  It's easy to sign up at home today.    eCare  enrollment will allow you access to the below benefits   You can make appointments online   View test results / Lab Results   Request prescription renewals   Obtain a copy of our After Visit Summary (an electronic copy of this document)     Your Test Results:  If labs were ordered today the results are expected to be available via eCare in about 5 days. If you have an active eCare account, this is how we will notify you of your results.     If you do not have an eCare account then your test results will be mailed to you within about 14 days after your tests are completed. If your physician needs to change your care based on your results or is concerned, you should expect a phone call or eCare message from your provider.    If you have any questions about your test results please schedule an appointment with your provider, so that we may review them with you in greater detail.    **If it has been more than 2 weeks and you have not received your test results please send our office a message via eCare.    Medication Refills: If you need a prescription refilled, please contact your pharmacy 1 week before your current supply will run out to request the refill.  Contacting your pharmacy is the fastest and safest way to obtain a medication refill.  The pharmacy will notify our office.  Please note, that a minimum of 48 to 72 hours is needed to refill a medication,  Please call your pharmacy early to allow enough time to refill before you anticipate running out.  For faster medication refills, you can also schedule an appointment with your provider.    We know you have a choice in where you receive your healthcare and we sincerely thank you for trusting [facility] with your health.

## 2015-08-30 ENCOUNTER — Telehealth (HOSPITAL_BASED_OUTPATIENT_CLINIC_OR_DEPARTMENT_OTHER): Payer: Self-pay | Admitting: Clinical

## 2015-08-30 ENCOUNTER — Encounter (HOSPITAL_BASED_OUTPATIENT_CLINIC_OR_DEPARTMENT_OTHER): Payer: Medicare Other | Admitting: Clinical

## 2015-08-30 NOTE — Telephone Encounter (Signed)
CCR opened in error

## 2015-08-30 NOTE — Telephone Encounter (Signed)
(  TEXTING IS AN OPTION FOR [facility] ONLY)  Is this a [facility] clinic? No      RETURN CALL: Detailed message on voicemail only      SUBJECT:  General Message     REASON FOR REQUEST: Reschedule of missed appointment 7/27.    MESSAGE: Patient's spouse called to reschedule appointment stating they had been stuck in a funeral and were unable to make the appointment, the patient is very upset about this. CCR unable to reschedule due to lack of referral scheduling directions. Please reach out to reschedule.

## 2015-08-31 NOTE — Telephone Encounter (Signed)
Spoke to patient's husband Fraser Din, rescheduled patient appointment.

## 2015-08-31 NOTE — Telephone Encounter (Signed)
Patient's husband called in again and would like the clinic to give him or his wife a call regarding the rescheduling of her social worker appointment. Patient will be leaving on the 08/03 until 08/19. Patient would like to have this done before 08/03. Thank you very much.

## 2015-09-04 DIAGNOSIS — M5416 Radiculopathy, lumbar region: Secondary | ICD-10-CM | POA: Diagnosis not present

## 2015-09-04 DIAGNOSIS — M5126 Other intervertebral disc displacement, lumbar region: Secondary | ICD-10-CM | POA: Diagnosis not present

## 2015-09-06 ENCOUNTER — Ambulatory Visit: Payer: Medicare Other | Attending: Physician Assistant | Admitting: Clinical

## 2015-09-06 DIAGNOSIS — Z659 Problem related to unspecified psychosocial circumstances: Secondary | ICD-10-CM

## 2015-09-06 NOTE — Progress Notes (Signed)
Danielle French  65 year old  16-Feb-1950  Surgery: Gastric Bypass  Referral Source: No referring provider defined for this encounter.    Duration: 60 minutes    Present at evaluation: Patient, husband in waiting room.     Social Supports & Post-Operative Care Plan  Follow-up appointment plan: The patient will be driven to appointments by her husband.     Post-Operative Care Plan: Danielle French lives in [place] with her husband. They have a second home in [place], [place]. They have a son who lives in [place] and a daughter in [place] and several grandchildren. The patient reports that she was thin as a child and a younger adult but has gradually gained weight over time. Danielle French states that she gained weight after starting insulin and has found it more difficult to lose weight since that time.  She reports that she has several good friends who have had bariatric surgery, most of whom have done very well. She has been connected with an online support group for several months.   The patient's husband, Danielle French [phone]). Danielle French will care for the patient after surgery along with their son, Danielle French ([phone]) and daughter Danielle French [phone]).    Have an adequate post-operative care plan? 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   Current/Previous employer: The patient has worked in [employer] for many years. 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   Medical Insurance:  Payor/Plan Subscriber Name Rel Member # Group #   MEDICARE - MEDICARE Danielle French, Danielle French [id number]          GENERIC/UNLISTED - GE* Danielle French,Danielle French [id number]       [address], [place] [address]         Patient's primary surgery goals: Improve diabetes, improve overall health, increase longevity.     Does the patient:   Know about bariatric surgery support groups? NO  Has the patient been encouraged to attend? NO  Take current  medication/therapies as prescribed? NO  Know they may need to purchase nutritional supplements out of pocket? NO    Mental Health  Mental Health Diagnosis: Patient denies any current or past mental health diagnoses.     Psychotropic Medications: None    Prescribing Provider: N/A    Mental health treatment: None    Length of psychiatric stability: Ongoing.     Suicidal history: Denied    Abuse/Mistreatment: Denied    GAD-7  Over the last two weeks, how often have you been bothered by the following problems?  Feeling nervous, anxious, or on edge: Not at all  Not being able to stop or control worrying: Not at all  Worrying too much about different things: Not at all  Trouble relaxing: Not at all  Being so restless that it is hard to sit still: Not at all  Becoming easliy annoyed or irritable: Not at all  Feeling afraid, as if something awful might happen: Not at all  Total Score  Total: 0    PHQ-9  Over the last 2 weeks how often have you been bothered by any of the following problems?  1. Little interest or pleasure in doing things: Not at all  2. Feeling down, depressed or hopeless: Not at all  3. Trouble falling or staying asleep, or sleeping too much: Not at all  4. Feeling tired or having little energy : Not at all  5. Poor appetite or overeating: Not at all  6. Feeling bad about yourself - or that you are a failure or have let yourself or your family down: Not at all  7. Trouble concentrating on things, such as reading the newspaper or watching television: Not at all  8. Moving or speaking much more slowly than usual.  Or the opposite - fidgety or restless: Not at all  9. Thoughts that you would be better off dead or of hurting yourself in some way: Not at all    Total  PHQ9 Total Score: 0         CAGE Screening  Cage Questionnaire  Have you ever felt you should cut down on your drinking?: No  Have people annoyed you by criticizing your drinking?: No  Have you felt bad or guilty about your drinking?: No  Have you  ever had a drink first thing in the morning to steady your nerves or get rid of a hangover (eye-opener)?: No    EE-I (emotional eating index, out of 40) score: 23- Moderate emotional eating.       Coping Skills: The patient takes a walk, talks to family and friends and spends time with grandchildren to relax. She enjoys travel, gardening and spending time with grandchildren.     Chemical Dependency  Tobacco: Past, quit 1990.     Alcohol: None    Illicit Drugs: None    Treatment: None      Does the patient report symptoms of current or historical eating disorder? NO    Recent behavior changes: Reduced portion size, decreased eating of bread and butter. Patient is not tracking calories. Patient has not met with clinic dietitian, was not aware that she ever had appointment with RD.    Weight loss in last 3 months: None. Patient was asked to lose 10 lbs by [facility] Jiles Crocker. Goal weight is 185 lbs.     Current physical activity: Walks 30 minutes a day. When in [place] does water aerobics in her home pool and swims. Patient was asked to routinely exercise 150 minutes per week and to elevate her heart rate for at least part of that time.    The patient was reminded that they are to track their exercise and bring their log to their physical therapy appointment for review.   Summary by Assessment Area  Patient cleared for:   Post-operative care plan YES  Medical adherence YES  Mental health YES  Chemical dependency (includes smoking) YES  Surgery preparation NO  Eating behaviors YES    Recommended plan    Danielle French is a pleasant patient who was referred to social work as part of a multidisciplinary assessment for bariatric surgery.  From a social work perspective the following issues will need attention prior to proceeding to surgery:    1.The patient will contact this Probation officer when weight goal has been reached. The patient's goal weight is 185 lbs.    2. The patient will begin to exercise 150 minutes a week routinely  vigorously.    Danielle French was encouraged to track her calories and aim for around 1200 calories per day. This Probation officer gave patient name of baritastic tracking app. Patient was asked to bring food record to RD appointment.     When the above issues have been successfully addressed the patient may be a reasonable candidate for surgery.  At that time they may be forwarded to test review.   The patient was given  resource information including how to contact social work with questions. Local support group information given. The patient understands and is in agreement with this plan as outlined above. Social work is available to assist as needed.        Manuela Neptune, MSW

## 2015-09-13 DIAGNOSIS — M9905 Segmental and somatic dysfunction of pelvic region: Secondary | ICD-10-CM | POA: Diagnosis not present

## 2015-09-13 DIAGNOSIS — M9903 Segmental and somatic dysfunction of lumbar region: Secondary | ICD-10-CM | POA: Diagnosis not present

## 2015-09-13 DIAGNOSIS — M9904 Segmental and somatic dysfunction of sacral region: Secondary | ICD-10-CM | POA: Diagnosis not present

## 2015-09-13 DIAGNOSIS — M545 Low back pain: Secondary | ICD-10-CM | POA: Diagnosis not present

## 2015-10-03 ENCOUNTER — Other Ambulatory Visit: Payer: Self-pay | Admitting: Family Medicine

## 2015-10-09 DIAGNOSIS — M5416 Radiculopathy, lumbar region: Secondary | ICD-10-CM | POA: Diagnosis not present

## 2015-10-09 DIAGNOSIS — Z6834 Body mass index (BMI) 34.0-34.9, adult: Secondary | ICD-10-CM | POA: Diagnosis not present

## 2015-10-09 DIAGNOSIS — M545 Low back pain: Secondary | ICD-10-CM | POA: Diagnosis not present

## 2015-10-09 DIAGNOSIS — M5126 Other intervertebral disc displacement, lumbar region: Secondary | ICD-10-CM | POA: Diagnosis not present

## 2015-10-16 ENCOUNTER — Other Ambulatory Visit: Payer: Self-pay | Admitting: Family Medicine

## 2015-10-23 ENCOUNTER — Ambulatory Visit (INDEPENDENT_AMBULATORY_CARE_PROVIDER_SITE_OTHER): Payer: Medicare PPO | Admitting: Family Medicine

## 2015-10-23 ENCOUNTER — Encounter: Payer: Self-pay | Admitting: Family Medicine

## 2015-10-23 VITALS — BP 142/80 | HR 84 | Temp 97.6°F | Resp 16 | Wt 189.0 lb

## 2015-10-23 DIAGNOSIS — Z23 Encounter for immunization: Secondary | ICD-10-CM

## 2015-10-23 DIAGNOSIS — M5432 Sciatica, left side: Secondary | ICD-10-CM

## 2015-10-23 DIAGNOSIS — M1990 Unspecified osteoarthritis, unspecified site: Secondary | ICD-10-CM

## 2015-10-23 DIAGNOSIS — F32A Depression, unspecified: Secondary | ICD-10-CM

## 2015-10-23 DIAGNOSIS — F329 Major depressive disorder, single episode, unspecified: Secondary | ICD-10-CM

## 2015-10-23 DIAGNOSIS — I10 Essential (primary) hypertension: Secondary | ICD-10-CM

## 2015-10-23 MED ORDER — PREDNISONE 10 MG (48) PO TBPK: ORAL_TABLET | Freq: Every day | ORAL | 1 refills | Status: DC

## 2015-10-23 MED ORDER — OXYCODONE HCL 10 MG PO TABS: 10.0000 mg | ORAL_TABLET | ORAL | 0 refills | Status: DC | PRN

## 2015-10-23 NOTE — Progress Notes (Signed)
Subjective:  HPI  Hypertension, follow-up:  BP Readings from Last 3 Encounters:  10/23/15 (!) 142/80  05/23/15 128/76  02/02/15 134/68    She was last seen for hypertension 5 months ago.  BP at that visit was 128 76. Management since that visit includes none. She reports excellent compliance with treatment. She is not having side effects. She is not exercising. She is adherent to low salt diet.   Outside blood pressures are 130/80. She is experiencing fatigue.  Patient denies chest pain, chest pressure/discomfort, dyspnea, irregular heart beat, lower extremity edema, near-syncope, orthopnea, palpitations and syncope.   Cardiovascular risk factors include advanced age (older than 47 for men, 28 for women), dyslipidemia and hypertension.  Wt Readings from Last 3 Encounters:  10/23/15 189 lb (85.7 kg)  05/23/15 200 lb (90.7 kg)  11/22/14 195 lb (88.5 kg)   ------------------------------------------------------------------------  Depression: Pt reports this is "fair; I'm holding on for the most part".  Chronic Back pain: Pt c/o left hip pain. Is worsening. Pt is requesting to discuss surgery. Pt is taking Tylenol Arthritis, without relief. Is also taking Norco 10-325 mg, and Valium 5 mg, with some relief.   Prior to Admission medications   Medication Sig Start Date End Date Taking? Authorizing Provider  cholecalciferol (VITAMIN D) 1000 UNITS tablet Take by mouth.    Historical Provider, MD  diazepam (VALIUM) 5 MG tablet Take 1 tablet (5 mg total) by mouth at bedtime as needed for anxiety. 05/23/15   Richard Maceo Pro., MD  HYDROcodone-acetaminophen [facility]) 10-325 MG tablet Take 1 tablet by mouth every 4 (four) hours as needed. 05/23/15   Jerrol Banana., MD  losartan (COZAAR) 50 MG tablet TAKE 1 TABLET EVERY DAY 10/17/15   Jerrol Banana., MD  simvastatin (ZOCOR) 20 MG tablet Take 1 tablet (20 mg total) by mouth daily. 05/23/15   Jerrol Banana., MD    triamterene-hydrochlorothiazide (DYAZIDE) 37.5-25 MG capsule TAKE 1 CAPSULE EVERY DAY 10/03/15   Jerrol Banana., MD  venlafaxine [facility]) 75 MG tablet Take 1 tablet (75 mg total) by mouth daily. 05/23/15   Richard Maceo Pro., MD    Patient Active Problem List   Diagnosis Date Noted  . Actinic keratosis 11/21/2014  . Adaptation reaction 11/21/2014  . Allergic rhinitis 11/21/2014  . Absolute anemia 11/21/2014  . Pulmonary collapse 11/21/2014  . Bell palsy 11/21/2014  . Cornu cutaneum 11/21/2014  . Clinical depression 11/21/2014  . Dermatitis, eczematoid 11/21/2014  . Abnormal LFTs 11/21/2014  . Enthesopathy 11/21/2014  . Essential (primary) hypertension 11/21/2014  . Anxiety, generalized 11/21/2014  . Hemorrhoids 11/21/2014  . Herniated nucleus pulposus 11/21/2014  . Mild episode of recurrent major depressive disorder ([place]) 11/21/2014  . Adiposity 11/21/2014  . Arthritis, degenerative 11/21/2014  . Basal cell papilloma 11/21/2014  . Apnea, sleep 11/21/2014  . Avitaminosis D 11/21/2014    History reviewed. No pertinent past medical history.  Social History   Social History  . Marital status: Married    Spouse name: N/A  . Number of children: N/A  . Years of education: N/A   Occupational History  . Not on file.   Social History Main Topics  . Smoking status: Former Smoker    Packs/day: 0.50    Years: 10.00    Quit date: 02/03/1993  . Smokeless tobacco: Never Used     Comment: quit in 1990  . Alcohol use Yes     Comment: Occasionally  .  Drug use: No  . Sexual activity: Not Currently   Other Topics Concern  . Not on file   Social History Narrative  . No narrative on file    Allergies  Allergen Reactions  . Sulfa Antibiotics     Review of Systems  Constitutional: Negative.   HENT: Negative.   Eyes: Negative.   Respiratory: Negative.   Cardiovascular: Negative.   Gastrointestinal: Negative.   Genitourinary: Negative.   Musculoskeletal:  Positive for back pain.  Skin: Negative.   Neurological: Negative.   Endo/Heme/Allergies: Negative.   Psychiatric/Behavioral: Positive for depression.    Immunization History  Administered Date(s) Administered  . Hepatitis B 06/12/1995, 07/15/1995, 12/15/1995  . Influenza, High Dose Seasonal PF 11/22/2014  . Pneumococcal Conjugate-13 10/11/2014  . Pneumococcal Polysaccharide-23 11/08/2008  . Td 06/21/2004  . Tdap 03/23/2014  . Zoster 11/13/2013   Objective:  BP (!) 142/80 (BP Location: Left Arm, Patient Position: Sitting, Cuff Size: Large)   Pulse 84   Temp 97.6 F (36.4 C) (Oral)   Resp 16   Wt 189 lb (85.7 kg)   BMI 31.94 kg/m   Physical Exam  Constitutional: She is oriented to person, place, and time and well-developed, well-nourished, and in no distress.  HENT:  Head: Normocephalic and atraumatic.  Eyes: Conjunctivae are normal.  Neck: Neck supple. No thyromegaly present.  Cardiovascular: Normal rate and regular rhythm.   Pulmonary/Chest: Effort normal and breath sounds normal.  Abdominal: Soft.  Musculoskeletal:  Left hip pain with inversion, movement. Ambulating with cane.   Lymphadenopathy:    She has no cervical adenopathy.  Neurological: She is alert and oriented to person, place, and time.  Skin: Skin is warm and dry.  Very fair skin  Psychiatric: Mood, memory, affect and judgment normal.    Lab Results  Component Value Date   WBC 6.2 11/22/2014   HGB 13.2 01/02/2014   HCT 39.8 11/22/2014   PLT 266 11/22/2014   GLUCOSE 87 11/22/2014   CHOL 174 11/22/2014   TRIG 146 11/22/2014   HDL 69 11/22/2014   LDLCALC 76 11/22/2014   TSH 0.456 11/22/2014    CMP     Component Value Date/Time   NA 139 11/22/2014 0928   K 4.4 11/22/2014 0928   CL 97 11/22/2014 0928   CO2 26 11/22/2014 0928   GLUCOSE 87 11/22/2014 0928   GLUCOSE 86 06/16/2008 1357   BUN 14 11/22/2014 0928   CREATININE 0.90 03/07/2015 1256   CALCIUM 9.5 11/22/2014 0928   PROT 6.9  11/22/2014 0928   ALBUMIN 4.4 11/22/2014 0928   AST 35 11/22/2014 0928   ALT 67 (H) 11/22/2014 0928   ALKPHOS 101 11/22/2014 0928   BILITOT 0.4 11/22/2014 0928   GFRNONAA 76 11/22/2014 0928   GFRAA 88 11/22/2014 0928    Assessment and Plan :  1. Essential (primary) hypertension Stable. Continue current medications and plan of care.  2. Clinical depression Stable. Continue current medications and plan of care.  3. Osteoarthritis, unspecified osteoarthritis type, unspecified site Worsening. See plan below.  4. Flu vaccine need Administered today. - Flu vaccine HIGH DOSE PF  5. Left sided sciatica Worsening. Hold Norco and start Oxycodone as below, as well as Prednisone taper. FU if sx worsen/ do not improve. - predniSONE (STERAPRED UNI-PAK 48 TAB) 10 MG (48) TBPK tablet; Take by mouth daily. Taper as directed.  Dispense: 48 tablet; Refill: 1 - Oxycodone HCl 10 MG TABS; Take 1 tablet (10 mg total) by mouth every 4 (  four) hours as needed.  Dispense: 180 tablet; Refill: 0  6. Chronic pain  Associated with #5 I have done the exam and reviewed the chart and it is accurate to the best of my knowledge. Miguel Aschoff M.D. [facility]  Patient seen and examined by Miguel Aschoff, MD, and note scribed by Renaldo Fiddler, CMA.   Miguel Aschoff MD [facility] 10/23/2015 11:59 AM

## 2015-10-24 ENCOUNTER — Encounter (HOSPITAL_BASED_OUTPATIENT_CLINIC_OR_DEPARTMENT_OTHER): Payer: Medicare Other | Admitting: Internal Medicine

## 2015-10-24 ENCOUNTER — Encounter (HOSPITAL_BASED_OUTPATIENT_CLINIC_OR_DEPARTMENT_OTHER): Payer: Medicare Other | Admitting: Registered"

## 2015-11-01 ENCOUNTER — Encounter (INDEPENDENT_AMBULATORY_CARE_PROVIDER_SITE_OTHER): Payer: Self-pay | Admitting: Internal Medicine

## 2015-11-01 ENCOUNTER — Other Ambulatory Visit: Payer: Self-pay | Admitting: Neurosurgery

## 2015-11-01 ENCOUNTER — Telehealth: Payer: Self-pay | Admitting: Family Medicine

## 2015-11-01 DIAGNOSIS — M48061 Spinal stenosis, lumbar region without neurogenic claudication: Secondary | ICD-10-CM

## 2015-11-01 NOTE — Telephone Encounter (Signed)
Victoria Dean went to see a Dr. Garwin Brothers in [place] regarding her back pain issues.    Dr. Garwin Brothers told her that the problem was the Simvastatin and to d/c it immediately.    Her question is...how long will it take the Simvastatin to get out of her system?  [phone]

## 2015-11-02 NOTE — Telephone Encounter (Signed)
Pt advised. Katlyn Muldrew Drozdowski, CMA  

## 2015-11-02 NOTE — Telephone Encounter (Signed)
1-2 weeks.

## 2015-11-09 ENCOUNTER — Telehealth: Payer: Self-pay | Admitting: Internal Medicine

## 2015-11-09 NOTE — Telephone Encounter (Signed)
(  TEXTING IS AN OPTION FOR [facility]IN[facility] ONLY)  Is this a [facility] clinic? No      RETURN CALL: Detailed message on voicemail only      SUBJECT:  Appointment Request     REASON FOR REQUEST/SYMPTOMS: Follow up appointment for Sleep  REFERRING PROVIDER: /  REQUEST APPOINTMENT WITH: Dr. Wayland Salinasandy Mandell  REQUESTED DATE: 11/19/15, TIME: Please discuss with the patient  UNABLE TO APPOINT BECAUSE: Patient insists on sending this message to schedule a follow up appointment with Dr. Wayland Salinasandy Mandell on Monday, 11/19/15 ONLY! Please contact the patient as soon as possible. Thanks!

## 2015-11-10 NOTE — Telephone Encounter (Signed)
I have an 8:30 AM ov-please put hold and call and offer ov If this slot becomes filled, or other issues-please let me know

## 2015-11-10 NOTE — Telephone Encounter (Signed)
Dr. Virgina JockMandell you are 100% booked on 11/19/2015. Do you want to double book?  Please advise.

## 2015-11-11 NOTE — Telephone Encounter (Signed)
Called left message asking for patient to return call.    CCR: When patient returns call please transfer to clinic to assist patient with scheduling at 8:30am with Dr. Virgina JockMandell on 11/19/15.

## 2015-11-14 NOTE — Telephone Encounter (Addendum)
Dr. Virgina JockMandell, patient has called back and stated she would be able to make the 8:30am but there was someone scheduled at 8:30am. I put her in the 8:15am to over book but she will be here 8:30am. I let her know I would confirm this with you and call her back to confirm. Please let me know this is ok to double book.    Routing to Dr. Virgina JockMandell to advise    If 8:30 slot filled, double booking is not going to work.I can see her at 12:15 slot. Have her come early and bring CPAP, mask and power cords.

## 2015-11-14 NOTE — Telephone Encounter (Signed)
If 8:30 slot filled, double booking is not going to work.I can see her at 12:15 slot. Have her come early and bring CPAP, mask and power cords.

## 2015-11-14 NOTE — Telephone Encounter (Signed)
x1 Attempt:  Left msg on non identifying v/m for pt to return call.     CCR: When patient returns call please transfer to clinic to assist patient with scheduling at 12:15pm  with Dr. Virgina JockMandell on 11/19/15.

## 2015-11-15 NOTE — Telephone Encounter (Signed)
Pt has been scheduled for 10/16 per notes.

## 2015-11-19 ENCOUNTER — Encounter (INDEPENDENT_AMBULATORY_CARE_PROVIDER_SITE_OTHER): Payer: Self-pay | Admitting: Internal Medicine

## 2015-11-19 ENCOUNTER — Ambulatory Visit (INDEPENDENT_AMBULATORY_CARE_PROVIDER_SITE_OTHER): Payer: Medicare Other | Admitting: Internal Medicine

## 2015-11-19 ENCOUNTER — Telehealth (INDEPENDENT_AMBULATORY_CARE_PROVIDER_SITE_OTHER): Payer: Self-pay | Admitting: Internal Medicine

## 2015-11-19 VITALS — BP 146/73 | HR 62 | Temp 97.7°F | Resp 14 | Ht 62.21 in | Wt 194.0 lb

## 2015-11-19 DIAGNOSIS — G4733 Obstructive sleep apnea (adult) (pediatric): Secondary | ICD-10-CM

## 2015-11-19 DIAGNOSIS — Z6835 Body mass index (BMI) 35.0-35.9, adult: Secondary | ICD-10-CM

## 2015-11-19 NOTE — Telephone Encounter (Signed)
She may not be making compliance. Please call [facility] and see oif can make compliance, if so when needs to be seen in clinic to certify compliance and if not when needs to be seen to get a 2nd compliance trial of re consult for a second PSG. Please send te back to me when done.

## 2015-11-19 NOTE — Telephone Encounter (Signed)
Spoke to patient, already knows that she needs another sleep study and needs to return the machine.     Closing te.

## 2015-11-19 NOTE — Progress Notes (Signed)
Reviewed eCare status with Patient:  YES    HEALTH MAINTENANCE:  Has the patient had any of these since their last visit?    Cervical screening/PAP: Not Due     Mammo: Not Due    Colon Screen: Not Due    Diabetic Eye Exam: Not Due      Have you seen a specialist since your last visit: No        HM Due:   Health Maintenance   Topic Date Due   . Hepatitis C Screen  Feb 05, 1950   . Diabetes Foot Exam  12/26/1966   . Diabetes Eye Exam  12/26/1966   . Mammogram  12/26/1998   . Colon Cancer Screen w/ FOBT/FIT  04/14/2013   . DEXA Scan  12/25/2013   . A1c  10/28/2015   . Influenza Vaccine (1) 11/04/2015   . Pneumococcal Vaccine (2 of 2 - PPSV23) 12/14/2015   . Cholesterol Test  04/26/2020   . Tetanus Vaccine  04/09/2022   . Zoster Vaccine  Completed           No future appointments.

## 2015-11-19 NOTE — Patient Instructions (Signed)
It was a pleasure to see you in clinic today.                If you are not yet signed up for eCare, please see the below eCare section at the end of this document for how to enroll and to get your access codes.  It's easy to sign up at home today.    eCare  enrollment will allow you access to the below benefits   You can make appointments online   View test results / Lab Results   Request prescription renewals   Obtain a copy of our After Visit Summary (an electronic copy of this document)     Your Test Results:  If labs were ordered today the results are expected to be available via eCare in about 5 days. If you have an active eCare account, this is how we will notify you of your results.     If you do not have an eCare account then your test results will be mailed to you within about 14 days after your tests are completed. If your physician needs to change your care based on your results or is concerned, you should expect a phone call or eCare message from your provider.    If you have any questions about your test results please schedule an appointment with your provider, so that we may review them with you in greater detail.    **If it has been more than 2 weeks and you have not received your test results please send our office a message via eCare.    Medication Refills: If you need a prescription refilled, please contact your pharmacy 1 week before your current supply will run out to request the refill.  Contacting your pharmacy is the fastest and safest way to obtain a medication refill.  The pharmacy will notify our office.  Please note, that a minimum of 48 to 72 hours is needed to refill a medication,  Please call your pharmacy early to allow enough time to refill before you anticipate running out.  For faster medication refills, you can also schedule an appointment with your provider.    We know you have a choice in where you receive your healthcare and we sincerely thank you for trusting [facility] with your health.

## 2015-11-20 ENCOUNTER — Telehealth: Payer: Self-pay | Admitting: Family Medicine

## 2015-11-20 NOTE — Telephone Encounter (Signed)
Need to enter a new referral for the inlab. Thank you Dr Virgina JockMandell

## 2015-11-20 NOTE — Progress Notes (Signed)
Sleep medicine consult  Chief Complaint: Sleepiness    History of Present Illness: I am seeing Sarajane MarekJeanne Mae Ebbs in  for evaluation of sleepiness. She has severe apnea, now adapted to CPAP, but failed compliance. She needs a new study to qualify for CPAP.    Sarajane MarekJeanne Mae Coca goes to bed around 11PM, takes 10 minutes to fall asleep, than wakes up at [facility]7AM Betsey Mae Maack wakes up 1-2 times a night, due to bladder.  She has no restless leg symptomatology. No hypnagogic or hypnopompic hallucination, sleep paralysis or cataplexy. She snores, at an intensity of 3/5, with rare witnessed pauses.   She does wake up out without a dry mouth, without a sore throat, without nasal congestion. She wakes up with a morning headache. She rarely wakes up at least twice a night to urinate. She does not have GERD symptoms interferering with sleep. She does not have light headed symptoms when standing. She does not have cold extremities. She does not have nightmares. She does not grind does not her teeth. She does not sleep talk, and she does not act out her dreams.    Sarajane MarekJeanne Mae Harries has trouble awaking in the morning, and fatigue and sleepiness are rarely a problem for her. Her Epworth Sleepiness Scale is 7/24. She consider her general health to be good. Her weight is Wt 194 lbs.      Review of Symptoms:  Neurological: no head ache  Heart: no chest pain      Review of patient's allergies indicates:  Allergies   Allergen Reactions   . Cats [Animals] Respiratory Distress     Wheezing, itchy eyes   . Pollen Extract Cough     Pollen and trees       Current Outpatient Prescriptions   Medication Sig Dispense Refill   . Aspirin 81 MG Oral Tab EC Take 81 mg by mouth daily.     . Atorvastatin Calcium 10 MG Oral Tab Take 1 tablet (10 mg) by mouth daily. 90 tablet 1   . Glucose Blood (ACCU-CHEK AVIVA) In Vitro Strip Use 1 strip 3 times a day. Use to check blood sugar. 300 strip 0   . HydroCHLOROthiazide 25 MG Oral Tab Take 1 tablet (25 mg) by  mouth daily. 90 tablet 3   . Insulin NPH Isophane & Regular (NOVOLIN 70/30) (70-30) 100 UNIT/ML Subcutaneous Suspension Inject 80 Units under the skin 2 times a day before meals. Inject 80 units twice daily before meals 10 mL 2   . Insulin Syringe-Needle U-100 31G X 5/16" 1 ML Misc Use 1 syringe 2 times a day. Use with Novolin 70/30 twice daily. 200 syringe 0   . Levothyroxine Sodium 112 MCG Oral Cap Take 1 capsule (112 mcg) by mouth daily on an empty stomach. 90 capsule 1   . Lisinopril 40 MG Oral Tab Take 1 tablet (40 mg) by mouth daily. 90 tablet 1   . MetFORMIN HCl 1000 MG Oral Tab Take 1 tablet (1,000 mg) by mouth 2 times a day. 180 tablet 1   . Pantoprazole Sodium 40 MG Oral Tab EC Take 1 tablet (40 mg) by mouth daily. 90 tablet 1   . Zolpidem Tartrate (AMBIEN) 10 MG Oral Tab Take 1 tablet (10 mg) by mouth at bedtime as needed. (Patient not taking: Reported on 11/19/2015) 1 tablet 0     No current facility-administered medications for this visit.        Family History   Problem Relation Age  of Onset   . Diabetes Mother    . Diabetes Father    . Diabetes Brother    . Diabetes Sister    . Heart Attack Paternal Uncle        Patient Active Problem List   Diagnosis   . IDDM (insulin dependent diabetes mellitus) (HCC)   . Hyperlipidemia   . Gastroesophageal reflux disease without esophagitis   . Hypothyroidism   . Secondary hypertension   . Abnormal EKG   . Obesity (BMI 35.0-39.9 without comorbidity)          Past Surgical History:   Procedure Laterality Date   . "tummy tuck"  1993   . COLONOSCOPY STOMA DX INCLUDING COLLJ SPEC SPX     . TOTAL ABDOMINAL HYSTERECT W/WO RMVL TUBE OVARY  1990   . UNLISTED PROCEDURE BREAST         Past Medical History:   Diagnosis Date   . Abnormal ECG    . Diabetes mellitus (HCC)    . Gastroesophageal reflux disease without esophagitis 03/30/2015   . Lipidemia    . Obesity    . Secondary hypertension 03/30/2015   . Thyroid disease          Social History: Palmira Stickle is married, with  children.  Smoking history: no. EtOH history: no. Recreational drug history: no. Caffeine history:1 coffee    Physical Examination:  Eleasha Cataldo is pleasant, alert and orientated in no acute distress  BP 146/73   Pulse 62   Temp 97.7 F (36.5 C) (Temporal)   Resp 14   Ht 5' 2.21" (1.58 m)   Wt 194 lb (88 kg)   SpO2 98%   BMI 35.24 kg/m  Body mass index is 35.24 kg/m. Neck: 15  HEAD: Oral airway Malpatti class III airway  Assessment:   1-Likely sleep apnea. Pathophysiology of apnea discussed, and questions answered. Health consequences of untreated apnea discussed. Diagnostic and treatment options discussed.     Recommendations:  1-Split night sleep study, discussed APAP if w/o split. Compliance rules discussed  2-Follow-up in 2 months, sooner as needed  3-Maiko Anjelina Dung is advised not to drive while drowsy. She understands this very important point.

## 2015-11-20 NOTE — Telephone Encounter (Signed)
Danielle French-can I order new study from yesterdays visit-or does she need another ov. Please check with CPAP company and let me know

## 2015-11-20 NOTE — Telephone Encounter (Signed)
Spoke w/ pt husband she said pt will call bk to sched awv - knb

## 2015-11-21 NOTE — Telephone Encounter (Signed)
Pt is returning call.  [id number]

## 2015-11-21 NOTE — Telephone Encounter (Signed)
error 

## 2015-11-30 ENCOUNTER — Encounter (HOSPITAL_COMMUNITY): Payer: Self-pay | Admitting: *Deleted

## 2015-11-30 ENCOUNTER — Emergency Department (HOSPITAL_COMMUNITY)
Admission: EM | Admit: 2015-11-30 | Discharge: 2015-11-30 | Disposition: A | Discharge status: Home / Self Care | Payer: Medicare PPO | Attending: Emergency Medicine | Admitting: Emergency Medicine

## 2015-11-30 DIAGNOSIS — M25551 Pain in right hip: Secondary | ICD-10-CM | POA: Insufficient documentation

## 2015-11-30 DIAGNOSIS — M545 Low back pain: Secondary | ICD-10-CM | POA: Diagnosis not present

## 2015-11-30 DIAGNOSIS — Z87891 Personal history of nicotine dependence: Secondary | ICD-10-CM | POA: Insufficient documentation

## 2015-11-30 DIAGNOSIS — I10 Essential (primary) hypertension: Secondary | ICD-10-CM | POA: Diagnosis not present

## 2015-11-30 DIAGNOSIS — M25552 Pain in left hip: Secondary | ICD-10-CM | POA: Insufficient documentation

## 2015-11-30 HISTORY — DX: Essential (primary) hypertension: I10

## 2015-11-30 HISTORY — DX: Pure hypercholesterolemia, unspecified: E78.00

## 2015-11-30 MED ORDER — KETOROLAC TROMETHAMINE 30 MG/ML IJ SOLN: 15.0000 mg | Freq: Once | INTRAMUSCULAR | Status: DC

## 2015-11-30 MED ORDER — DIAZEPAM 2 MG PO TABS
2.0000 mg | ORAL_TABLET | Freq: Once | ORAL | Status: AC
  Administered 2015-11-30: 2 mg via ORAL
  Filled 2015-11-30: qty 1

## 2015-11-30 MED ORDER — KETOROLAC TROMETHAMINE 60 MG/2ML IM SOLN
15.0000 mg | Freq: Once | INTRAMUSCULAR | Status: AC
  Administered 2015-11-30: 15 mg via INTRAMUSCULAR
  Filled 2015-11-30: qty 2

## 2015-11-30 MED ORDER — OXYCODONE-ACETAMINOPHEN 5-325 MG PO TABS
1.0000 | ORAL_TABLET | Freq: Once | ORAL | Status: AC
  Administered 2015-11-30: 1 via ORAL
  Filled 2015-11-30: qty 1

## 2015-11-30 MED ORDER — ACETAMINOPHEN 500 MG PO TABS
1000.0000 mg | ORAL_TABLET | Freq: Once | ORAL | Status: AC
  Administered 2015-11-30: 1000 mg via ORAL
  Filled 2015-11-30: qty 2

## 2015-11-30 NOTE — Discharge Planning (Signed)
[facility] reviewed discharging chart for possible CM needs.  No needs identified.     Follow-up appointment needs? Pt advised to follow up with PCP Delfino Lovett [place] Mon.)  Transportation needs? n/a  Medication assistance needs? No rx given  Equipment needs? n/a                                      Oxygen needs? n/a  H.H needs? n/a  [place] Cos[place] J. Clydene Laming, [place], [place], [place]

## 2015-11-30 NOTE — ED Triage Notes (Signed)
Pt c/o increasing L hip pain.  States several back surgeries for bulging discs that caused same pain.  Made ESI 3 for AGE alone.

## 2015-11-30 NOTE — ED Provider Notes (Signed)
[facility] DEPT Provider Note   CSN: [id number] Arrival date & time: 11/30/15  0827     History   Chief Complaint Chief Complaint  Patient presents with  . Hip Pain    HPI ALEXYA BELDIN is a 65 y.o. female with chronic back pain present in today with worsening low back pain and bilateral leg pain. She states that this pain has been going on for over a year but has gotten acutely worse since Tuesday. She states that she has not had any new injury or trauma to this area. She woke up this morning and felt as though her pain had gotten worse from yesterday. Pain is located in the low back and radiates laterally to her ankle bilaterally. She states that the left side is significantly more severe than the right. She denies any urinary or fecal incontinence. She denies any saddle anesthesia. She denies any foot drop. She endorses some "numbness" but after further inquiry symptoms did not have typical numbness presentation.  HPI  Past Medical History:  Diagnosis Date  . Hypercholesteremia   . Hypertension     Patient Active Problem List   Diagnosis Date Noted  . Actinic keratosis 11/21/2014  . Adaptation reaction 11/21/2014  . Allergic rhinitis 11/21/2014  . Absolute anemia 11/21/2014  . Pulmonary collapse 11/21/2014  . Bell palsy 11/21/2014  . Cornu cutaneum 11/21/2014  . Clinical depression 11/21/2014  . Dermatitis, eczematoid 11/21/2014  . Abnormal LFTs 11/21/2014  . Enthesopathy 11/21/2014  . Essential (primary) hypertension 11/21/2014  . Anxiety, generalized 11/21/2014  . Hemorrhoids 11/21/2014  . Herniated nucleus pulposus 11/21/2014  . Mild episode of recurrent major depressive disorder ([place]) 11/21/2014  . Adiposity 11/21/2014  . Arthritis, degenerative 11/21/2014  . Basal cell papilloma 11/21/2014  . Apnea, sleep 11/21/2014  . Avitaminosis D 11/21/2014    Past Surgical History:  Procedure Laterality Date  . ABDOMINAL HYSTERECTOMY  1995  . APPENDECTOMY  1994    . MICRODISCECTOMY LUMBAR  2008   L4-5  . TONSILLECTOMY  1971    OB History    No data available       Home Medications    Prior to Admission medications   Medication Sig Start Date End Date Taking? Authorizing Provider  Acetaminophen (TYLENOL ARTHRITIS PAIN PO) Take by mouth.    Historical Provider, MD  Cholecalciferol (VITAMIN D3) 3000 units TABS Take by mouth.    Historical Provider, MD  diazepam (VALIUM) 5 MG tablet Take 1 tablet (5 mg total) by mouth at bedtime as needed for anxiety. 05/23/15   Richard Maceo Pro., MD  HYDROcodone-acetaminophen [facility]) 10-325 MG tablet Take 1 tablet by mouth every 4 (four) hours as needed. Patient not taking: Reported on 11/06/2015 05/23/15   Jerrol Banana., MD  losartan (COZAAR) 50 MG tablet TAKE 1 TABLET EVERY DAY 10/17/15   Jerrol Banana., MD  Oxycodone HCl 10 MG TABS Take 1 tablet (10 mg total) by mouth every 4 (four) hours as needed. 10/23/15   Jerrol Banana., MD  triamterene-hydrochlorothiazide (DYAZIDE) 37.5-25 MG capsule TAKE 1 CAPSULE EVERY DAY 10/03/15   Jerrol Banana., MD  venlafaxine [facility]) 75 MG tablet Take 1 tablet (75 mg total) by mouth daily. 05/23/15   Richard Maceo Pro., MD    Family History Family History  Problem Relation Age of Onset  . Cancer Mother     Bone and breast cancer  . Hypertension Brother   . COPD Father   .  Hypertension Father     Social History Social History  Substance Use Topics  . Smoking status: Former Smoker    Packs/day: 0.50    Years: 10.00    Quit date: 02/03/1993  . Smokeless tobacco: Never Used     Comment: quit in 1990  . Alcohol use Yes     Comment: Occasionally     Allergies   Sulfa antibiotics   Review of Systems Review of Systems  Constitutional: Negative for activity change, appetite change, chills, fatigue and fever.  HENT: Negative.   Eyes: Negative.   Respiratory: Negative.   Cardiovascular: Negative.   Gastrointestinal: Negative.    Endocrine: Negative.   Genitourinary: Negative.   Musculoskeletal: Positive for arthralgias, back pain, gait problem and myalgias. Negative for joint swelling, neck pain and neck stiffness.  Skin: Negative for rash.  Neurological: Negative for dizziness, tremors, seizures, facial asymmetry, weakness, light-headedness and headaches.  Hematological: Negative.   Psychiatric/Behavioral: Negative.      Physical Exam Updated Vital Signs BP 130/73   Pulse 79   Temp 97.6 F (36.4 C) (Oral)   Resp 19   Ht 5\' 3"  (1.6 m)   Wt 85.9 kg   SpO2 97%   BMI 33.53 kg/m   Physical Exam  Constitutional: She is oriented to person, place, and time. She appears well-developed and well-nourished. No distress.  HENT:  Head: Normocephalic and atraumatic.  Nose: Nose normal.  Eyes: Conjunctivae and EOM are normal. Pupils are equal, round, and reactive to light.  Neck: Normal range of motion. Neck supple.  Cardiovascular: Normal rate, regular rhythm and normal heart sounds.   No murmur heard. Pulmonary/Chest: Effort normal and breath sounds normal. No respiratory distress.  Abdominal: Soft. Bowel sounds are normal. She exhibits no distension. There is no tenderness.  Musculoskeletal:  ROM intact bilaterally at hips, knees, and ankles. Heel walks and toe walks intact bilaterally. Tenderness to palpation over the bilateral SI joints and piriformis. Normal Trendelenburg Seated straight leg raise positive for pain but negative for radiation beyond the knee.  Neurological: She is alert and oriented to person, place, and time.  Reflex Scores:      Bicep reflexes are 2+ on the right side and 2+ on the left side.      Brachioradialis reflexes are 2+ on the right side and 2+ on the left side.      Patellar reflexes are 0 on the right side and 1+ on the left side. Skin: She is not diaphoretic.     ED Treatments / Results  Labs (all labs ordered are listed, but only abnormal results are displayed) Labs  Reviewed - No data to display  EKG  EKG Interpretation None       Radiology No results found.  Procedures Procedures (including critical care time)  Medications Ordered in ED Medications  acetaminophen (TYLENOL) tablet 1,000 mg (1,000 mg Oral Given 11/30/15 0955)  oxyCODONE-acetaminophen (PERCOCET/ROXICET) 5-325 MG per tablet 1 tablet (1 tablet Oral Given 11/30/15 0955)  diazepam (VALIUM) tablet 2 mg (2 mg Oral Given 11/30/15 0955)  ketorolac (TORADOL) injection 15 mg (15 mg Intramuscular Given 11/30/15 0955)     Initial Impression / Assessment and Plan / ED Course  I have reviewed the triage vital signs and the nursing notes.  Pertinent labs & imaging results that were available during my care of the patient were reviewed by me and considered in my medical decision making (see chart for details).  Clinical Course   Bilateral hip  pain: Patient is here with complaints of bilateral hip pain. Etiology likely secondary to patient's underlying lumbar spine away and disc pathology. Patient does not have a physical exam to suggest actual sciatica as straight leg raise did not radiate beyond the level of the knee. Rule out peripheral nerve impingement however this would be less likely due to its bilateral nature (consider lateral femoral cutaneous nerve impingement). Due to the chronic nature of this condition it was recommended that patient follow up with PCP and her neurosurgeon who she is already been established with. Future consideration for SNRI (Cymbalta) may be warranted for chronic pain by PCP as patient's pain distribution does not follow a typical dermatomal pattern making true spinal nerve compression etiology suspect. Patient exhibited no reflex symptoms during our exam today. She was provided IM Toradol, by mouth Tylenol, by mouth Ativan, and by mouth Roxicet. She is deemed stable for discharge home and was discharged.   Final Clinical Impressions(s) / ED Diagnoses   Final  diagnoses:  Pain of both hip joints    New Prescriptions New Prescriptions   No medications on file     Elberta Leatherwood, MD 11/30/15 [place], DO 11/30/15 1010

## 2015-12-03 ENCOUNTER — Other Ambulatory Visit: Payer: Self-pay | Admitting: Family

## 2015-12-03 DIAGNOSIS — IMO0001 Reserved for inherently not codable concepts without codable children: Secondary | ICD-10-CM

## 2015-12-03 DIAGNOSIS — E139 Other specified diabetes mellitus without complications: Secondary | ICD-10-CM

## 2015-12-03 DIAGNOSIS — Z794 Long term (current) use of insulin: Secondary | ICD-10-CM

## 2015-12-04 MED ORDER — GLUCOSE BLOOD VI STRP
ORAL_STRIP | 1 refills | Status: AC
Start: 2015-12-04 — End: ?

## 2015-12-07 DIAGNOSIS — M5136 Other intervertebral disc degeneration, lumbar region: Secondary | ICD-10-CM | POA: Diagnosis not present

## 2015-12-14 ENCOUNTER — Other Ambulatory Visit (INDEPENDENT_AMBULATORY_CARE_PROVIDER_SITE_OTHER): Payer: Self-pay | Admitting: Family Medicine

## 2015-12-14 DIAGNOSIS — E039 Hypothyroidism, unspecified: Secondary | ICD-10-CM

## 2015-12-17 ENCOUNTER — Other Ambulatory Visit: Payer: Self-pay | Admitting: Family Medicine

## 2015-12-17 DIAGNOSIS — M5432 Sciatica, left side: Secondary | ICD-10-CM

## 2015-12-17 MED ORDER — OXYCODONE HCL 10 MG PO TABS: 10.0000 mg | ORAL_TABLET | ORAL | 0 refills | Status: DC | PRN

## 2015-12-17 NOTE — Telephone Encounter (Signed)
Requested: Levothyroxine 112 mcg tablet - 90 tablets last filled 07/06/15    The previous PCP listed in EPIC was Fonnie MuKerry Meyer, ARNP.  There is no PCP assigned in EPIC.  Please ask the patient who her current PCP is.  The requesting pharmacy is in [place].

## 2015-12-17 NOTE — Telephone Encounter (Signed)
Please call patient to see if she is still seeing Nash DimmerKerry for care   We note that her address is [place]  Does she have a PCP in [place]?

## 2015-12-17 NOTE — Telephone Encounter (Signed)
Called and spoke to her pt's daughter and she said she is not seeing Nash DimmerKerry for follow up. Nothing further needed.Closing TE.

## 2015-12-17 NOTE — Telephone Encounter (Signed)
Ok to rf? 

## 2015-12-17 NOTE — Telephone Encounter (Signed)
Please review-aa 

## 2015-12-17 NOTE — Telephone Encounter (Signed)
Rx printed

## 2015-12-17 NOTE — Telephone Encounter (Signed)
Pt contacted office for refill request on the following medications:  Oxycodone HCl 10 MG TABS.  CB#[phone]/MW  Pt also wanted to advise she is going to see Dr Minerva Ends in [place].  Pt states she is waiting on insurance to give authorization to see him/MW

## 2015-12-18 ENCOUNTER — Telehealth (INDEPENDENT_AMBULATORY_CARE_PROVIDER_SITE_OTHER): Payer: Self-pay

## 2015-12-18 DIAGNOSIS — E118 Type 2 diabetes mellitus with unspecified complications: Secondary | ICD-10-CM

## 2015-12-18 DIAGNOSIS — Z794 Long term (current) use of insulin: Secondary | ICD-10-CM

## 2015-12-18 NOTE — Telephone Encounter (Signed)
(  TEXTING IS AN OPTION FOR [facility] ONLY)  Is this a [facility] clinic? call back this time      RETURN CALL: Detailed message on voicemail only      SUBJECT:  Medication Management/Questions     MEDICATION(S): Insulin NPH Isophane & Regular (NOVOLIN 70/30) (70-30) 100 UNIT/ML Subcutaneous Suspension  CONCERNS/QUESTIONS: refill request  ADDITIONAL INFORMATION: Patient called in stating that she needs a refill of medication, however, due to the open claims that she has with patient relations she would need a provider other than Dr. Eulas Postead-Williams or Fonnie MuKerry Meyer to prescribe the medication. CCR unsure of who should prescribe medication and patient is currently in [place]. She is requesting a emergency refill of medication. She stated that she will be back in town this weekend of 12/22/2015

## 2015-12-18 NOTE — Telephone Encounter (Signed)
Pending Prescriptions:                       Disp   Refills    Insulin NPH Isophane & Regular (NOVOLIN 7*10 mL  2            Sig: Inject 80 Units under the skin 2 times a day           before meals. Inject 80 units twice daily before           meals

## 2015-12-19 DIAGNOSIS — D2271 Melanocytic nevi of right lower limb, including hip: Secondary | ICD-10-CM | POA: Diagnosis not present

## 2015-12-19 DIAGNOSIS — D2262 Melanocytic nevi of left upper limb, including shoulder: Secondary | ICD-10-CM | POA: Diagnosis not present

## 2015-12-19 DIAGNOSIS — D225 Melanocytic nevi of trunk: Secondary | ICD-10-CM | POA: Diagnosis not present

## 2015-12-19 DIAGNOSIS — D2272 Melanocytic nevi of left lower limb, including hip: Secondary | ICD-10-CM | POA: Diagnosis not present

## 2015-12-19 MED ORDER — INSULIN NPH ISOPHANE & REGULAR (70-30) 100 UNIT/ML SC SUSP
80.0000 [IU] | Freq: Two times a day (BID) | SUBCUTANEOUS | 0 refills | Status: AC
Start: 2015-12-19 — End: ?

## 2015-12-19 NOTE — Telephone Encounter (Signed)
Patient would like to know the status of the refill. Thank you.

## 2015-12-19 NOTE — Telephone Encounter (Signed)
Insulin NPH Isophane & Regular (NOVOLIN 70/30) (70-30) 100 UNIT/ML Subcutaneous Suspension  Medication Refill Documentation  Prescribing Provider Glenetta BorgPatricia Read-Williams MD  Date last prescribed 07/05/2015  No upcoming appts.    Routing to R.R. DonnelleyKerry Meyer, ARNP, because she is covering provider.   Daphane ShepherdMeyer, ARNP not shown to have covering provider today 11/15.  Please advise on who to route refill request to given open claims in patient relations with patient.

## 2015-12-19 NOTE — Telephone Encounter (Signed)
Front Desk, please assist with pt scheduling. Thank you.

## 2015-12-19 NOTE — Telephone Encounter (Signed)
Spoke to patient and passed along message below. Closing TE

## 2015-12-19 NOTE — Telephone Encounter (Signed)
Pt called and was very rude and aggressive regarding a refill request that was not filled fast enough. Pt stated that she did not want Fonnie MuKerry Meyer involved because of past incident with her and she also stated that Dr Eulas Postead-Williams messed up previous refills by not giving her enough.    Advised that refills can take from 48-72hrs and patient stated that she does not care what are policies are and that we need to refill her RX without question. I told patient that an urgent request was sent on 11/14 and that the covering provider would follow up on request and pt stated that we are messing with her life and need to just refill RX. I advised pt that I would resend message as urgent and she continued to be rude and aggressive, so I advised her that if she continued being rude, I would end the call.    Pt continued to be rude and call the providers incompetent, so I then ended the call. Routing to Dr Eulas Postead-Williams and Fonnie MuKerry Meyer for follow up.

## 2015-12-19 NOTE — Telephone Encounter (Signed)
Schedule OV for follow up

## 2015-12-24 ENCOUNTER — Encounter (INDEPENDENT_AMBULATORY_CARE_PROVIDER_SITE_OTHER): Payer: Self-pay | Admitting: Internal Medicine

## 2015-12-24 ENCOUNTER — Telehealth (INDEPENDENT_AMBULATORY_CARE_PROVIDER_SITE_OTHER): Payer: Self-pay | Admitting: Cardiovascular Disease

## 2015-12-24 NOTE — Telephone Encounter (Signed)
(  TEXTING IS AN OPTION FOR [facility] ONLY)  Is this a [facility] clinic? No      RETURN CALL: Detailed message on voicemail only      SUBJECT:  General Message     REASON FOR REQUEST: Patient is wanting to speak with her provider regarding some medical records.    MESSAGE: States she contacted the medical records department in the summer however she did not get all of them. Please call back to discuss. Thank you.

## 2015-12-26 DIAGNOSIS — M5416 Radiculopathy, lumbar region: Secondary | ICD-10-CM | POA: Diagnosis not present

## 2015-12-26 NOTE — Telephone Encounter (Signed)
Left message for patient to give us a call back regarding medical records. Please transfer to pod 4.     Renae Fickleeotta Larkin Alfred MA  [facility]   [facility]

## 2016-01-11 DIAGNOSIS — M5416 Radiculopathy, lumbar region: Secondary | ICD-10-CM | POA: Diagnosis not present

## 2016-01-11 NOTE — Telephone Encounter (Signed)
Sending requested cardiology records to [phone] [address] [place] [phone]     I advise patient these records can take up to several weeks to arrive at the requested location.     Patient is requesting for Dr. Lucretia RoersWood to expedite records if possible.     Routing to Dr. Lucretia RoersWood and closing TE    Renae Fickleeotta Kateryna Grantham MA  [facility]   [facility]

## 2016-01-24 ENCOUNTER — Ambulatory Visit (INDEPENDENT_AMBULATORY_CARE_PROVIDER_SITE_OTHER): Payer: Medicare PPO | Admitting: Family Medicine

## 2016-01-24 ENCOUNTER — Ambulatory Visit (INDEPENDENT_AMBULATORY_CARE_PROVIDER_SITE_OTHER): Payer: Medicare PPO

## 2016-01-24 ENCOUNTER — Encounter: Payer: Self-pay | Admitting: Family Medicine

## 2016-01-24 VITALS — BP 124/82 | HR 84 | Temp 98.2°F | Ht 63.0 in | Wt 184.5 lb

## 2016-01-24 VITALS — BP 124/82 | HR 84 | Temp 98.2°F | Ht 63.0 in | Wt 184.8 lb

## 2016-01-24 DIAGNOSIS — Z23 Encounter for immunization: Secondary | ICD-10-CM

## 2016-01-24 DIAGNOSIS — Z1211 Encounter for screening for malignant neoplasm of colon: Secondary | ICD-10-CM

## 2016-01-24 DIAGNOSIS — I10 Essential (primary) hypertension: Secondary | ICD-10-CM

## 2016-01-24 DIAGNOSIS — G8929 Other chronic pain: Secondary | ICD-10-CM

## 2016-01-24 DIAGNOSIS — Z0000 Encounter for general adult medical examination without abnormal findings: Secondary | ICD-10-CM | POA: Diagnosis not present

## 2016-01-24 DIAGNOSIS — M549 Dorsalgia, unspecified: Secondary | ICD-10-CM

## 2016-01-24 DIAGNOSIS — K644 Residual hemorrhoidal skin tags: Secondary | ICD-10-CM | POA: Diagnosis not present

## 2016-01-24 DIAGNOSIS — E7800 Pure hypercholesterolemia, unspecified: Secondary | ICD-10-CM | POA: Diagnosis not present

## 2016-01-24 NOTE — Progress Notes (Addendum)
Subjective:   TRELA HEMRIC is a 65 y.o. female who presents for Medicare Annual (Subsequent) preventive examination.  Review of Systems:  N/A  Cardiac Risk Factors include: advanced age (>14men, >97 women);hypertension;obesity (BMI >30kg/m2)     Objective:     Vitals: BP 124/82 (BP Location: Right Arm)   Pulse 84   Temp 98.2 F (36.8 C) (Oral)   Ht 5\' 3"  (1.6 m)   Wt 184 lb 8 oz (83.7 kg)   BMI 32.68 kg/m   Body mass index is 32.68 kg/m.   Tobacco History  Smoking Status  . Current Every Day Smoker  . Packs/day: 0.25  Smokeless Tobacco  . Never Used    Comment: to half pack     Ready to quit: Not Answered Counseling given: Not Answered   Past Medical History:  Diagnosis Date  . Hypercholesteremia   . Hypertension    Past Surgical History:  Procedure Laterality Date  . ABDOMINAL HYSTERECTOMY  1995  . APPENDECTOMY  1994  . ESI    . MICRODISCECTOMY LUMBAR  2008   L4-5  . TONSILLECTOMY  1971   Family History  Problem Relation Age of Onset  . Cancer Mother     Bone and breast cancer  . Hypertension Brother   . COPD Father   . Hypertension Father    History  Sexual Activity  . Sexual activity: Not Currently    Outpatient Encounter Prescriptions as of 01/24/2016  Medication Sig  . Cholecalciferol (VITAMIN D3) 3000 units TABS Take by mouth.  . diazepam (VALIUM) 5 MG tablet Take 1 tablet (5 mg total) by mouth at bedtime as needed for anxiety. (Patient taking differently: Take 5 mg by mouth at bedtime as needed for anxiety. )  . docusate sodium (STOOL SOFTENER) 100 MG capsule Take 100 mg by mouth daily.  Marland Kitchen losartan (COZAAR) 50 MG tablet TAKE 1 TABLET EVERY DAY  . naproxen sodium (ANAPROX) 220 MG tablet Take 220 mg by mouth 2 (two) times daily with a meal.  . Oxycodone HCl 10 MG TABS Take 1 tablet (10 mg total) by mouth every 4 (four) hours as needed.  . triamterene-hydrochlorothiazide (DYAZIDE) 37.5-25 MG capsule TAKE 1 CAPSULE EVERY DAY  .  venlafaxine (EFFEXOR) 75 MG tablet Take 1 tablet (75 mg total) by mouth daily.  . Acetaminophen (TYLENOL ARTHRITIS PAIN PO) Take by mouth.  Marland Kitchen HYDROcodone-acetaminophen (NORCO) 10-325 MG tablet Take 1 tablet by mouth every 4 (four) hours as needed. (Patient not taking: Reported on 01/24/2016)   No facility-administered encounter medications on file as of 01/24/2016.     Activities of Daily Living In your present state of health, do you have any difficulty performing the following activities: 01/24/2016  Hearing? N  Vision? N  Difficulty concentrating or making decisions? N  Walking or climbing stairs? Y  Dressing or bathing? N  Doing errands, shopping? N  Preparing Food and eating ? N  Using the Toilet? N  In the past six months, have you accidently leaked urine? N  Do you have problems with loss of bowel control? N  Managing your Medications? N  Managing your Finances? N  Housekeeping or managing your Housekeeping? Y  Some recent data might be hidden    Patient Care Team: Jerrol Banana., MD as PCP - General (Family Medicine) Crista Curb. Gloriann Loan, MD as Consulting Physician (Ophthalmology) Marko Plume, MD as Referring Physician (Orthopedic Surgery)    Assessment:     Exercise Activities and  Dietary recommendations Current Exercise Habits: Home exercise routine, Type of exercise: walking, Time (Minutes): 60, Frequency (Times/Week): 3, Weekly Exercise (Minutes/Week): 180  Goals    . Increase water intake          Starting 01/24/16, I will continue drinking 4-6 glasses of water a day.      Fall Risk Fall Risk  01/24/2016 11/22/2014  Falls in the past year? No No   Depression Screen PHQ 2/9 Scores 01/24/2016 11/22/2014  PHQ - 2 Score 5 0  PHQ- 9 Score 11 2     Cognitive Function     6CIT Screen 01/24/2016  What Year? 0 points  What month? 0 points  What time? 0 points  Count back from 20 0 points  Months in reverse 0 points  Repeat phrase 0 points  Total  Score 0    Immunization History  Administered Date(s) Administered  . Hepatitis B 06/12/1995, 07/15/1995, 12/15/1995  . Influenza, High Dose Seasonal PF 11/22/2014, 10/23/2015  . Pneumococcal Conjugate-13 10/11/2014  . Pneumococcal Polysaccharide-23 11/08/2008, 01/24/2016  . Td 06/21/2004  . Tdap 03/23/2014  . Zoster 11/13/2013   Screening Tests Health Maintenance  Topic Date Due  . COLONOSCOPY  01/23/2026 (Originally 12/26/1998)  . MAMMOGRAM  04/25/2016  . TETANUS/TDAP  03/23/2024  . INFLUENZA VACCINE  Completed  . DEXA SCAN  Completed  . ZOSTAVAX  Completed  . Hepatitis C Screening  Completed  . PNA vac Low Risk Adult  Completed      Plan:  I have personally reviewed and addressed the Medicare Annual Wellness questionnaire and have noted the following in the patient's chart:  A. Medical and social history B. Use of alcohol, tobacco or illicit drugs  C. Current medications and supplements D. Functional ability and status E.  Nutritional status F.  Physical activity G. Advance directives H. List of other physicians I.  Hospitalizations, surgeries, and ER visits in previous 12 months J.  [place] such as hearing and vision if needed, cognitive and depression L. Referrals and appointments - none  In addition, I have reviewed and discussed with patient certain preventive protocols, quality metrics, and best practice recommendations. A written personalized care plan for preventive services as well as general preventive health recommendations were provided to patient.  See attached scanned questionnaire for additional information.   Signed,  Fabio Neighbors, LPN Nurse Health Advisor   MD Recommendations: none.  I have reviewed the health advisors note, was  available for consultation and I agree with documentation and plan. Miguel Aschoff MD [facility]

## 2016-01-24 NOTE — Progress Notes (Signed)
Patient: Victoria Dean, Female    DOB: [birth year], 65 y.o.   MRN: [id number] Visit Date: 01/24/2016  Today's Provider: [name], MD   Chief Complaint  Patient presents with  . Annual Exam   Subjective:    Annual physical exam Victoria Dean is a 65 y.o. female who presents today for health maintenance and complete physical. She feels fairly well. She reports exercising, walking. She is retired and is married,has 1 daughter and 1 granddaughter. ----------------------------------------------------------------- BMD- 04/26/14 Mammogram- 04/26/14 Pap- 02/13/03 Colonoscopy- declined  Immunization History  Administered Date(s) Administered  . Hepatitis B 06/12/1995, 07/15/1995, 12/15/1995  . Influenza, High Dose Seasonal PF 11/22/2014, 10/23/2015  . Pneumococcal Conjugate-13 10/11/2014  . Pneumococcal Polysaccharide-23 11/08/2008, 01/24/2016  . Td 06/21/2004  . Tdap 03/23/2014  . Zoster 11/13/2013      Review of Systems  Constitutional: Positive for activity change and appetite change.  HENT: Negative.   Eyes: Positive for itching.  Cardiovascular: Positive for chest pain (after eating, related to GERD).  Gastrointestinal: Positive for constipation and nausea.  Endocrine: Negative.   Genitourinary: Negative.   Musculoskeletal: Positive for arthralgias, back pain and myalgias.  Skin: Negative.   Allergic/Immunologic: Negative.   Neurological: Positive for tremors and weakness.  Hematological: Negative.   Psychiatric/Behavioral: The patient is nervous/anxious.     Social History      She  reports that she has been smoking.  She has been smoking about 0.25 packs per day. She has never used smokeless tobacco. She reports that she drinks alcohol. She reports that she does not use drugs.       Social History   Social History  . Marital status: Married    Spouse name: N/A  . Number of children: N/A  . Years of education: N/A   Social History Main Topics    . Smoking status: Current Every Day Smoker    Packs/day: 0.25  . Smokeless tobacco: Never Used     Comment: to half pack  . Alcohol use Yes     Comment: Occasionally  . Drug use: No  . Sexual activity: Not Currently   Other Topics Concern  . None   Social History Narrative  . None    Past Medical History:  Diagnosis Date  . Hypercholesteremia   . Hypertension      Patient Active Problem List   Diagnosis Date Noted  . Actinic keratosis 11/21/2014  . Adaptation reaction 11/21/2014  . Allergic rhinitis 11/21/2014  . Absolute anemia 11/21/2014  . Pulmonary collapse 11/21/2014  . Bell palsy 11/21/2014  . Cornu cutaneum 11/21/2014  . Clinical depression 11/21/2014  . Dermatitis, eczematoid 11/21/2014  . Abnormal LFTs 11/21/2014  . Enthesopathy 11/21/2014  . Essential (primary) hypertension 11/21/2014  . Anxiety, generalized 11/21/2014  . Hemorrhoids 11/21/2014  . Herniated nucleus pulposus 11/21/2014  . Mild episode of recurrent major depressive disorder ([place]) 11/21/2014  . Adiposity 11/21/2014  . Arthritis, degenerative 11/21/2014  . Basal cell papilloma 11/21/2014  . Apnea, sleep 11/21/2014  . Avitaminosis D 11/21/2014    Past Surgical History:  Procedure Laterality Date  . ABDOMINAL HYSTERECTOMY  1995  . APPENDECTOMY  1994  . ESI    . MICRODISCECTOMY LUMBAR  2008   L4-5  . TONSILLECTOMY  1971    Family History        Family Status  Relation Status  . Mother Deceased at age 21   cause of death was bone cancer  .  Brother Alive  . Father Deceased at age 35   cause of death was unknown  . Sister Alive  . Brother Alive        Her family history includes COPD in her father; Cancer in her mother; Hypertension in her brother and father.     Allergies  Allergen Reactions  . Sulfa Antibiotics      Current Outpatient Prescriptions:  .  Acetaminophen (TYLENOL ARTHRITIS PAIN PO), Take by mouth., Disp: , Rfl:  .  Cholecalciferol (VITAMIN D3) 3000 units  TABS, Take by mouth., Disp: , Rfl:  .  diazepam (VALIUM) 5 MG tablet, Take 1 tablet (5 mg total) by mouth at bedtime as needed for anxiety. (Patient taking differently: Take 5 mg by mouth at bedtime as needed for anxiety. ), Disp: 30 tablet, Rfl: 5 .  docusate sodium (STOOL SOFTENER) 100 MG capsule, Take 100 mg by mouth daily., Disp: , Rfl:  .  losartan (COZAAR) 50 MG tablet, TAKE 1 TABLET EVERY DAY, Disp: 90 tablet, Rfl: 3 .  naproxen sodium (ANAPROX) 220 MG tablet, Take 220 mg by mouth 2 (two) times daily with a meal., Disp: , Rfl:  .  Oxycodone HCl 10 MG TABS, Take 1 tablet (10 mg total) by mouth every 4 (four) hours as needed., Disp: 180 tablet, Rfl: 0 .  triamterene-hydrochlorothiazide (DYAZIDE) 37.5-25 MG capsule, TAKE 1 CAPSULE EVERY DAY, Disp: 90 capsule, Rfl: 3 .  venlafaxine (EFFEXOR) 75 MG tablet, Take 1 tablet (75 mg total) by mouth daily., Disp: 90 tablet, Rfl: 3 .  HYDROcodone-acetaminophen (NORCO) 10-325 MG tablet, Take 1 tablet by mouth every 4 (four) hours as needed. (Patient not taking: Reported on 01/24/2016), Disp: 150 tablet, Rfl: 0   Patient Care Team: Jerrol Banana., MD as PCP - General (Family Medicine) Crista Curb. Gloriann Loan, MD as Consulting Physician (Ophthalmology) Marko Plume, MD as Referring Physician (Orthopedic Surgery)      Objective:   Vitals: BP 124/82   Pulse 84   Temp 98.2 F (36.8 C)   Ht 5\' 3"  (1.6 m)   Wt 184 lb 12.8 oz (83.8 kg)   BMI 32.74 kg/m    Physical Exam  Constitutional: She is oriented to person, place, and time. She appears well-developed and well-nourished.  HENT:  Head: Normocephalic and atraumatic.  Right Ear: External ear normal.  Left Ear: External ear normal.  Nose: Nose normal.  Mouth/Throat: Oropharynx is clear and moist.  Eyes: Conjunctivae and EOM are normal. Pupils are equal, round, and reactive to light.  Neck: Normal range of motion. Neck supple.  Cardiovascular: Normal rate, regular rhythm, normal heart sounds and  intact distal pulses.   Pulmonary/Chest: Effort normal and breath sounds normal.  Abdominal: Soft. Bowel sounds are normal.  Genitourinary: Rectal exam shows guaiac negative stool.  Genitourinary Comments: Atypical appearing lesion of anus  Musculoskeletal: Normal range of motion.  Neurological: She is alert and oriented to person, place, and time. She has normal reflexes.  Skin: Skin is warm and dry.  Fair skin.  Psychiatric: She has a normal mood and affect. Her behavior is normal. Judgment and thought content normal.     Depression Screen PHQ 2/9 Scores 01/24/2016 11/22/2014  PHQ - 2 Score 5 0  PHQ- 9 Score 11 2      Assessment & Plan:     Routine Health Maintenance and Physical Exam  Exercise Activities and Dietary recommendations Goals    . Increase water intake  Starting 01/24/16, I will continue drinking 4-6 glasses of water a day.       Immunization History  Administered Date(s) Administered  . Hepatitis B 06/12/1995, 07/15/1995, 12/15/1995  . Influenza, High Dose Seasonal PF 11/22/2014, 10/23/2015  . Pneumococcal Conjugate-13 10/11/2014  . Pneumococcal Polysaccharide-23 11/08/2008, 01/24/2016  . Td 06/21/2004  . Tdap 03/23/2014  . Zoster 11/13/2013    Health Maintenance  Topic Date Due  . COLONOSCOPY  01/23/2026 (Originally 12/26/1998)  . MAMMOGRAM  04/25/2016  . TETANUS/TDAP  03/23/2024  . INFLUENZA VACCINE  Completed  . DEXA SCAN  Completed  . ZOSTAVAX  Completed  . Hepatitis C Screening  Completed  . PNA vac Low Risk Adult  Completed     Discussed health benefits of physical activity, and encouraged her to engage in regular exercise appropriate for her age and condition.  Chronic Back Pain/DDD/Spinal Stenosis Accupuncture and epiduarals have both helped. Statin Intolerance Causes muscle weakness. AKs Refer to dermatology. HTN HLD Chronic Depression Hemorrhoids with hemorrhoidal tags Pt continues to refuse screening  colonoscopy.Mucosa of tags seems to have texture not c/w tags. Have surgery evaluate to r/o possibility of anal  SCC. Chronic Anxiety OSA I have done the exam and reviewed the chart and it is accurate to the best of my knowledge. Development worker, community has been used and  any errors in dictation or transcription are unintentional. Miguel Aschoff M.D. [facility]    --------------------------------------------------------------------    [name], MD  [facility]

## 2016-01-24 NOTE — Patient Instructions (Signed)
Victoria Dean , Thank you for taking time to come for your Medicare Wellness Visit. I appreciate your ongoing commitment to your health goals. Please review the following plan we discussed and let me know if I can assist you in the future.   These are the goals we discussed: Goals    . Increase water intake          Starting 01/24/16, I will continue drinking 4-6 glasses of water a day.       This is a list of the screening recommended for you and due dates:  Health Maintenance  Topic Date Due  . Colon Cancer Screening  12/26/1998  . Pneumonia vaccines (2 of 2 - PPSV23) 10/11/2015  . Mammogram  04/25/2016  . Tetanus Vaccine  03/23/2024  . Flu Shot  Completed  . DEXA scan (bone density measurement)  Completed  . Shingles Vaccine  Completed  .  Hepatitis C: One time screening is recommended by Center for Disease Control  (CDC) for  adults born from [birth year] through 1967.   Completed   Preventive Care for Adults  A healthy lifestyle and preventive care can promote health and wellness. Preventive health guidelines for adults include the following key practices.  . A routine yearly physical is a good way to check with your health care provider about your health and preventive screening. It is a chance to share any concerns and updates on your health and to receive a thorough exam.  . Visit your dentist for a routine exam and preventive care every 6 months. Brush your teeth twice a day and floss once a day. Good oral hygiene prevents tooth decay and gum disease.  . The frequency of eye exams is based on your age, health, family medical history, use  of contact lenses, and other factors. Follow your health care provider's ecommendations for frequency of eye exams.  . Eat a healthy diet. Foods like vegetables, fruits, whole grains, low-fat dairy products, and lean protein foods contain the nutrients you need without too many calories. Decrease your intake of foods high in solid fats, added  sugars, and salt. Eat the right amount of calories for you. Get information about a proper diet from your health care provider, if necessary.  . Regular physical exercise is one of the most important things you can do for your health. Most adults should get at least 150 minutes of moderate-intensity exercise (any activity that increases your heart rate and causes you to sweat) each week. In addition, most adults need muscle-strengthening exercises on 2 or more days a week.  Silver Sneakers may be a benefit available to you. To determine eligibility, you may visit the website: www.silversneakers.com or contact program at 757-353-3014 Mon-Fri between 8AM-8PM.   . Maintain a healthy weight. The body mass index (BMI) is a screening tool to identify possible weight problems. It provides an estimate of body fat based on height and weight. Your health care provider can find your BMI and can help you achieve or maintain a healthy weight.   For adults 20 years and older: ? A BMI below 18.5 is considered underweight. ? A BMI of 18.5 to 24.9 is normal. ? A BMI of 25 to 29.9 is considered overweight. ? A BMI of 30 and above is considered obese.   . Maintain normal blood lipids and cholesterol levels by exercising and minimizing your intake of saturated fat. Eat a balanced diet with plenty of fruit and vegetables. Blood tests for lipids and  cholesterol should begin at age 17 and be repeated every 5 years. If your lipid or cholesterol levels are high, you are over 50, or you are at high risk for heart disease, you may need your cholesterol levels checked more frequently. Ongoing high lipid and cholesterol levels should be treated with medicines if diet and exercise are not working.  . If you smoke, find out from your health care provider how to quit. If you do not use tobacco, please do not start.  . If you choose to drink alcohol, please do not consume more than 2 drinks per day. One drink is considered to  be 12 ounces (355 mL) of beer, 5 ounces (148 mL) of wine, or 1.5 ounces (44 mL) of liquor.  . If you are 39-28 years old, ask your health care provider if you should take aspirin to prevent strokes.  . Use sunscreen. Apply sunscreen liberally and repeatedly throughout the day. You should seek shade when your shadow is shorter than you. Protect yourself by wearing long sleeves, pants, a wide-brimmed hat, and sunglasses year round, whenever you are outdoors.  . Once a month, do a whole body skin exam, using a mirror to look at the skin on your back. Tell your health care provider of new moles, moles that have irregular borders, moles that are larger than a pencil eraser, or moles that have changed in shape or color.

## 2016-01-25 LAB — CBC WITH DIFFERENTIAL/PLATELET
Basophils Absolute: 0 10*3/uL (ref 0.0–0.2)
Basos: 0 %
EOS (ABSOLUTE): 0.1 10*3/uL (ref 0.0–0.4)
Eos: 1 %
Hematocrit: 39.3 % (ref 34.0–46.6)
Hemoglobin: 12.7 g/dL (ref 11.1–15.9)
IMMATURE GRANULOCYTES: 0 %
Immature Grans (Abs): 0 10*3/uL (ref 0.0–0.1)
Lymphocytes Absolute: 2.4 10*3/uL (ref 0.7–3.1)
Lymphs: 29 %
MCH: 29.5 pg (ref 26.6–33.0)
MCHC: 32.3 g/dL (ref 31.5–35.7)
MCV: 91 fL (ref 79–97)
MONOS ABS: 0.7 10*3/uL (ref 0.1–0.9)
Monocytes: 8 %
NEUTROS PCT: 62 %
Neutrophils Absolute: 5 10*3/uL (ref 1.4–7.0)
PLATELETS: 294 10*3/uL (ref 150–379)
RBC: 4.3 x10E6/uL (ref 3.77–5.28)
RDW: 14.3 % (ref 12.3–15.4)
WBC: 8.2 10*3/uL (ref 3.4–10.8)

## 2016-01-25 LAB — COMPREHENSIVE METABOLIC PANEL
A/G RATIO: 1.8 (ref 1.2–2.2)
ALT: 12 IU/L (ref 0–32)
AST: 10 IU/L (ref 0–40)
Albumin: 4.4 g/dL (ref 3.6–4.8)
Alkaline Phosphatase: 104 IU/L (ref 39–117)
BUN/Creatinine Ratio: 17 (ref 12–28)
BUN: 21 mg/dL (ref 8–27)
Bilirubin Total: 0.4 mg/dL (ref 0.0–1.2)
CALCIUM: 9.7 mg/dL (ref 8.7–10.3)
CO2: 26 mmol/L (ref 18–29)
CREATININE: 1.21 mg/dL — AB (ref 0.57–1.00)
Chloride: 96 mmol/L (ref 96–106)
GFR, EST AFRICAN AMERICAN: 53 mL/min/{1.73_m2} — AB (ref >59)
GFR, EST NON AFRICAN AMERICAN: 46 mL/min/{1.73_m2} — AB (ref >59)
Globulin, Total: 2.5 g/dL (ref 1.5–4.5)
Glucose: 93 mg/dL (ref 65–99)
POTASSIUM: 4.1 mmol/L (ref 3.5–5.2)
Sodium: 140 mmol/L (ref 134–144)
TOTAL PROTEIN: 6.9 g/dL (ref 6.0–8.5)

## 2016-01-25 LAB — LIPID PANEL WITH LDL/HDL RATIO
Cholesterol, Total: 222 mg/dL — ABNORMAL HIGH (ref 100–199)
HDL: 58 mg/dL (ref >39)
LDL Calculated: 141 mg/dL — ABNORMAL HIGH (ref 0–99)
LDl/HDL Ratio: 2.4 ratio units (ref 0.0–3.2)
Triglycerides: 114 mg/dL (ref 0–149)
VLDL Cholesterol Cal: 23 mg/dL (ref 5–40)

## 2016-01-25 LAB — TSH: TSH: 0.497 u[IU]/mL (ref 0.450–4.500)

## 2016-01-25 MED ORDER — EZETIMIBE 10 MG PO TABS: 10.0000 mg | ORAL_TABLET | Freq: Every day | ORAL | 0 refills | Status: DC

## 2016-01-25 NOTE — Progress Notes (Signed)
LMTCB

## 2016-02-06 ENCOUNTER — Ambulatory Visit: Payer: Self-pay | Admitting: General Surgery

## 2016-02-08 ENCOUNTER — Telehealth: Payer: Self-pay | Admitting: Family Medicine

## 2016-02-08 NOTE — Telephone Encounter (Signed)
Can try taking OTC metamucil every evening. If not helping within a week she should discuss with Dr. Rosanna Randy.

## 2016-02-08 NOTE — Telephone Encounter (Signed)
Please review-aa 

## 2016-02-08 NOTE — Telephone Encounter (Signed)
Pt states the pain medication she is taking is making her have constipation.  Pt has been taking over the counter stool softeners, Equate brand 100 mg-pt is taking 2 in the morning and 2 in the evening. Pt is also taking a stimulative laxative every other day.  Pt does not want to come in to be seen.  Pt is requesting advise as to what else she can use to help with this, [facility].  [id number]  This is a Dr Rosanna Randy pt/MW

## 2016-02-08 NOTE — Telephone Encounter (Signed)
Pt advised as below. Victoria Dean, CMA  

## 2016-02-24 NOTE — Telephone Encounter (Signed)
Medical Records Shredded on 02/24/2016

## 2016-02-29 DIAGNOSIS — M5416 Radiculopathy, lumbar region: Secondary | ICD-10-CM | POA: Diagnosis not present

## 2016-03-05 ENCOUNTER — Encounter: Payer: Self-pay | Admitting: General Surgery

## 2016-03-05 ENCOUNTER — Ambulatory Visit (INDEPENDENT_AMBULATORY_CARE_PROVIDER_SITE_OTHER): Payer: Medicare PPO | Admitting: General Surgery

## 2016-03-05 ENCOUNTER — Other Ambulatory Visit: Payer: Self-pay | Admitting: Family Medicine

## 2016-03-05 VITALS — BP 130/72 | HR 70 | Resp 14 | Ht 63.0 in | Wt 186.0 lb

## 2016-03-05 DIAGNOSIS — A630 Anogenital (venereal) warts: Secondary | ICD-10-CM

## 2016-03-05 DIAGNOSIS — M5432 Sciatica, left side: Secondary | ICD-10-CM

## 2016-03-05 MED ORDER — OXYCODONE HCL 10 MG PO TABS: 10.0000 mg | ORAL_TABLET | ORAL | 0 refills | Status: DC | PRN

## 2016-03-05 NOTE — Telephone Encounter (Signed)
Needs refill on Oxycodone.    Viviane is seeing Dr. Jamal Collin at 3:00 and would like to pick this prescription up after her visit with him today.

## 2016-03-05 NOTE — Progress Notes (Signed)
Patient ID: Victoria Dean, female   DOB: 1950/12/05, 66 y.o.   MRN: [id number]  Chief Complaint  Patient presents with  . Other    hemorrhoids    HPI Victoria Dean is a 66 y.o. female here today for a evaluation of hemorrhoids and skin tag. Patient states no bleeding or pain. She has never had a colonoscopy but will have a colonoscopy after her back surgery.  I have reviewed the history of present illness with the patient.  HPI  Past Medical History:  Diagnosis Date  . Hypercholesteremia   . Hypertension     Past Surgical History:  Procedure Laterality Date  . ABDOMINAL HYSTERECTOMY  1995  . APPENDECTOMY  1994  . ESI    . MICRODISCECTOMY LUMBAR  2008   L4-5  . TONSILLECTOMY  1971    Family History  Problem Relation Age of Onset  . Cancer Mother     Bone and breast cancer  . Hypertension Brother   . COPD Father   . Hypertension Father     Social History Social History  Substance Use Topics  . Smoking status: Current Every Day Smoker    Packs/day: 0.25  . Smokeless tobacco: Never Used     Comment: to half pack  . Alcohol use Yes     Comment: Occasionally    Allergies  Allergen Reactions  . Sulfa Antibiotics     Current Outpatient Prescriptions  Medication Sig Dispense Refill  . Acetaminophen (TYLENOL ARTHRITIS PAIN PO) Take by mouth.    . Cholecalciferol (VITAMIN D3) 3000 units TABS Take by mouth.    . diazepam (VALIUM) 5 MG tablet Take 1 tablet (5 mg total) by mouth at bedtime as needed for anxiety. (Patient taking differently: Take 5 mg by mouth at bedtime as needed for anxiety. ) 30 tablet 5  . docusate sodium (STOOL SOFTENER) 100 MG capsule Take 100 mg by mouth daily.    Marland Kitchen ezetimibe (ZETIA) 10 MG tablet Take 1 tablet (10 mg total) by mouth daily. 90 tablet 0  . gabapentin (NEURONTIN) 300 MG capsule     . losartan (COZAAR) 50 MG tablet TAKE 1 TABLET EVERY DAY 90 tablet 3  . naproxen sodium (ANAPROX) 220 MG tablet Take 220 mg by mouth 2 (two) times daily  with a meal.    . triamterene-hydrochlorothiazide (DYAZIDE) 37.5-25 MG capsule TAKE 1 CAPSULE EVERY DAY 90 capsule 3  . venlafaxine (EFFEXOR) 75 MG tablet Take 1 tablet (75 mg total) by mouth daily. 90 tablet 3  . HYDROcodone-acetaminophen (NORCO) 10-325 MG tablet Take 1 tablet by mouth every 4 (four) hours as needed. (Patient not taking: Reported on 01/24/2016) 150 tablet 0  . Oxycodone HCl 10 MG TABS Take 1 tablet (10 mg total) by mouth every 4 (four) hours as needed. 180 tablet 0   No current facility-administered medications for this visit.     Review of Systems Review of Systems  Constitutional: Negative.   Respiratory: Negative.   Cardiovascular: Negative.     Blood pressure 130/72, pulse 70, resp. rate 14, height 5\' 3"  (1.6 m), weight 186 lb (84.4 kg).  Physical Exam Physical Exam  Constitutional: She is oriented to person, place, and time. She appears well-developed and well-nourished.  She moves very slowly and is obviously limited by her back pain  Eyes: Conjunctivae are normal. No scleral icterus.  Cardiovascular: Normal rate and regular rhythm.   Genitourinary: Rectal exam shows external hemorrhoid and internal hemorrhoid.  Genitourinary Comments: Internal  and external hemorrhoids at 92 OCL with perianal warts surrounding hemorrhoids.    Neurological: She is alert and oriented to person, place, and time.  Skin: Skin is warm and dry.    Data Reviewed Prior notes  Assessment    Perianal warts. Pt advised on this and need for excision and biopsy    Plan    Schedule excision of perianal warts. Patient is also due for a colonoscopy.  Okay to wait until after her upcoming  spinal fusion. Patient is agreeable.    This information has been scribed by Gaspar Cola CMA.    SANKAR,SEEPLAPUTHUR G 03/05/2016, 3:19 PM

## 2016-03-05 NOTE — Telephone Encounter (Signed)
RX printed and pt advised-aa 

## 2016-03-05 NOTE — Telephone Encounter (Signed)
ok 

## 2016-03-12 DIAGNOSIS — M5416 Radiculopathy, lumbar region: Secondary | ICD-10-CM | POA: Diagnosis not present

## 2016-03-13 DIAGNOSIS — M5416 Radiculopathy, lumbar region: Secondary | ICD-10-CM | POA: Diagnosis not present

## 2016-04-04 ENCOUNTER — Encounter: Payer: Self-pay | Admitting: Family Medicine

## 2016-04-04 ENCOUNTER — Ambulatory Visit (INDEPENDENT_AMBULATORY_CARE_PROVIDER_SITE_OTHER): Payer: Medicare PPO | Admitting: Family Medicine

## 2016-04-04 ENCOUNTER — Other Ambulatory Visit: Payer: Self-pay | Admitting: Family Medicine

## 2016-04-04 VITALS — BP 104/64 | HR 99 | Temp 98.8°F | Resp 18 | Wt 180.6 lb

## 2016-04-04 DIAGNOSIS — J0100 Acute maxillary sinusitis, unspecified: Secondary | ICD-10-CM | POA: Diagnosis not present

## 2016-04-04 DIAGNOSIS — M5416 Radiculopathy, lumbar region: Secondary | ICD-10-CM | POA: Diagnosis not present

## 2016-04-04 MED ORDER — AMOXICILLIN 875 MG PO TABS: 875.0000 mg | ORAL_TABLET | Freq: Two times a day (BID) | ORAL | 0 refills | Status: DC

## 2016-04-04 NOTE — Patient Instructions (Signed)

## 2016-04-04 NOTE — Progress Notes (Unsigned)
Power out at the [facility] at [place]. Patient asked to have the prescription transferred to the [facility]. Done.

## 2016-04-04 NOTE — Progress Notes (Signed)
Patient: Victoria Dean Female    DOB: 03/23/1950   66 y.o.   MRN: [id number] Visit Date: 04/04/2016  Today's Provider: Vernie Murders, PA   Chief Complaint  Patient presents with  . Sinusitis   Subjective:    Sinusitis  This is a new problem. The current episode started yesterday. The problem is unchanged. Associated symptoms include ear pain, sinus pressure and a sore throat. (Rhinorrhea, green mucus  ) Treatments tried: Coricidin HP. The treatment provided no relief.   Patient Active Problem List   Diagnosis Date Noted  . Actinic keratosis 11/21/2014  . Adaptation reaction 11/21/2014  . Allergic rhinitis 11/21/2014  . Absolute anemia 11/21/2014  . Pulmonary collapse 11/21/2014  . Bell palsy 11/21/2014  . Cornu cutaneum 11/21/2014  . Clinical depression 11/21/2014  . Dermatitis, eczematoid 11/21/2014  . Abnormal LFTs 11/21/2014  . Enthesopathy 11/21/2014  . Essential (primary) hypertension 11/21/2014  . Anxiety, generalized 11/21/2014  . Hemorrhoids 11/21/2014  . Herniated nucleus pulposus 11/21/2014  . Mild episode of recurrent major depressive disorder ([place]) 11/21/2014  . Adiposity 11/21/2014  . Arthritis, degenerative 11/21/2014  . Basal cell papilloma 11/21/2014  . Apnea, sleep 11/21/2014  . Avitaminosis D 11/21/2014   Past Surgical History:  Procedure Laterality Date  . ABDOMINAL HYSTERECTOMY  1995  . APPENDECTOMY  1994  . ESI    . MICRODISCECTOMY LUMBAR  2008   L4-5  . TONSILLECTOMY  1971   Family History  Problem Relation Age of Onset  . Cancer Mother     Bone and breast cancer  . Hypertension Brother   . COPD Father   . Hypertension Father    Allergies  Allergen Reactions  . Sulfa Antibiotics      Previous Medications   ACETAMINOPHEN (TYLENOL ARTHRITIS PAIN PO)    Take by mouth.   CHOLECALCIFEROL (VITAMIN D3) 3000 UNITS TABS    Take by mouth.   DIAZEPAM (VALIUM) 5 MG TABLET    Take 1 tablet (5 mg total) by mouth at bedtime as needed for  anxiety.   DOCUSATE SODIUM (STOOL SOFTENER) 100 MG CAPSULE    Take 100 mg by mouth daily.   EZETIMIBE (ZETIA) 10 MG TABLET    Take 1 tablet (10 mg total) by mouth daily.   GABAPENTIN (NEURONTIN) 300 MG CAPSULE       HYDROCODONE-ACETAMINOPHEN (NORCO) 10-325 MG TABLET    Take 1 tablet by mouth every 4 (four) hours as needed.   LOSARTAN (COZAAR) 50 MG TABLET    TAKE 1 TABLET EVERY DAY   NAPROXEN SODIUM (ANAPROX) 220 MG TABLET    Take 220 mg by mouth 2 (two) times daily with a meal.   OXYCODONE HCL 10 MG TABS    Take 1 tablet (10 mg total) by mouth every 4 (four) hours as needed.   TRIAMTERENE-HYDROCHLOROTHIAZIDE (DYAZIDE) 37.5-25 MG CAPSULE    TAKE 1 CAPSULE EVERY DAY   VENLAFAXINE (EFFEXOR) 75 MG TABLET    Take 1 tablet (75 mg total) by mouth daily.    Review of Systems  Constitutional: Negative.   HENT: Positive for ear pain, rhinorrhea, sinus pain, sinus pressure and sore throat.   Respiratory: Negative.   Cardiovascular: Negative.     Social History  Substance Use Topics  . Smoking status: Current Every Day Smoker    Packs/day: 0.25  . Smokeless tobacco: Never Used     Comment: to half pack  . Alcohol use Yes     Comment: Occasionally  Objective:   BP 104/64 (BP Location: Right Arm, Patient Position: Sitting, Cuff Size: Normal)   Pulse 99   Temp 98.8 F (37.1 C) (Oral)   Resp 18   Wt 180 lb 9.6 oz (81.9 kg)   SpO2 93%   BMI 31.99 kg/m   Physical Exam  Constitutional: She is oriented to person, place, and time. She appears well-developed and well-nourished. No distress.  HENT:  Head: Normocephalic and atraumatic.  Right Ear: Hearing and external ear normal.  Left Ear: Hearing and external ear normal.  Nose: Nose normal.  Slight cobblestoning of posterior pharynx. Tender and poor transillumination of maxillary sinuses.  Eyes: Conjunctivae and lids are normal. Right eye exhibits no discharge. Left eye exhibits no discharge. No scleral icterus.  Neck: Neck supple.    Cardiovascular: Normal rate and regular rhythm.   Pulmonary/Chest: Effort normal and breath sounds normal. No respiratory distress.  Abdominal: Soft. Bowel sounds are normal.  Lymphadenopathy:    She has no cervical adenopathy.  Neurological: She is alert and oriented to person, place, and time.  Skin: Skin is intact. No lesion and no rash noted.  Psychiatric: She has a normal mood and affect. Her speech is normal and behavior is normal. Thought content normal.      Assessment & Plan:     1. Subacute maxillary sinusitis Onset over the past couple days of sinus pressure, rhinorrhea, bilateral ear pain, green nasal mucus and slight cough. No fever. Planning lumbar surgery 04-10-16. May use Mucinex-DM prn cough, OTC antihistamine and OTC Flonase nasal spray. Add Amoxil and increase fluid intake. Recheck prn. - amoxicillin (AMOXIL) 875 MG tablet; Take 1 tablet (875 mg total) by mouth 2 (two) times daily.  Dispense: 20 tablet; Refill: 0

## 2016-04-14 ENCOUNTER — Encounter: Payer: Self-pay | Admitting: General Surgery

## 2016-04-21 DIAGNOSIS — E785 Hyperlipidemia, unspecified: Secondary | ICD-10-CM | POA: Diagnosis not present

## 2016-04-21 DIAGNOSIS — M5116 Intervertebral disc disorders with radiculopathy, lumbar region: Secondary | ICD-10-CM | POA: Diagnosis not present

## 2016-04-21 DIAGNOSIS — E784 Other hyperlipidemia: Secondary | ICD-10-CM | POA: Diagnosis not present

## 2016-04-21 DIAGNOSIS — F17210 Nicotine dependence, cigarettes, uncomplicated: Secondary | ICD-10-CM | POA: Diagnosis not present

## 2016-04-21 DIAGNOSIS — I10 Essential (primary) hypertension: Secondary | ICD-10-CM | POA: Diagnosis not present

## 2016-04-21 DIAGNOSIS — F418 Other specified anxiety disorders: Secondary | ICD-10-CM | POA: Diagnosis not present

## 2016-04-21 DIAGNOSIS — M5136 Other intervertebral disc degeneration, lumbar region: Secondary | ICD-10-CM | POA: Diagnosis not present

## 2016-04-21 DIAGNOSIS — F17200 Nicotine dependence, unspecified, uncomplicated: Secondary | ICD-10-CM | POA: Diagnosis not present

## 2016-04-21 DIAGNOSIS — F341 Dysthymic disorder: Secondary | ICD-10-CM | POA: Diagnosis not present

## 2016-04-21 DIAGNOSIS — M48062 Spinal stenosis, lumbar region with neurogenic claudication: Secondary | ICD-10-CM | POA: Diagnosis not present

## 2016-04-21 DIAGNOSIS — M5416 Radiculopathy, lumbar region: Secondary | ICD-10-CM | POA: Diagnosis not present

## 2016-04-21 DIAGNOSIS — M48061 Spinal stenosis, lumbar region without neurogenic claudication: Secondary | ICD-10-CM | POA: Diagnosis not present

## 2016-04-22 DIAGNOSIS — I10 Essential (primary) hypertension: Secondary | ICD-10-CM | POA: Diagnosis not present

## 2016-04-22 DIAGNOSIS — F17210 Nicotine dependence, cigarettes, uncomplicated: Secondary | ICD-10-CM | POA: Diagnosis not present

## 2016-04-22 DIAGNOSIS — M5116 Intervertebral disc disorders with radiculopathy, lumbar region: Secondary | ICD-10-CM | POA: Diagnosis not present

## 2016-04-22 DIAGNOSIS — F418 Other specified anxiety disorders: Secondary | ICD-10-CM | POA: Diagnosis not present

## 2016-04-22 DIAGNOSIS — M48061 Spinal stenosis, lumbar region without neurogenic claudication: Secondary | ICD-10-CM | POA: Diagnosis not present

## 2016-04-22 DIAGNOSIS — M5136 Other intervertebral disc degeneration, lumbar region: Secondary | ICD-10-CM | POA: Diagnosis not present

## 2016-04-22 DIAGNOSIS — E785 Hyperlipidemia, unspecified: Secondary | ICD-10-CM | POA: Diagnosis not present

## 2016-04-23 ENCOUNTER — Ambulatory Visit: Payer: Medicare PPO | Admitting: Family Medicine

## 2016-04-30 DIAGNOSIS — Z9889 Other specified postprocedural states: Secondary | ICD-10-CM | POA: Insufficient documentation

## 2016-06-10 ENCOUNTER — Other Ambulatory Visit: Payer: Self-pay | Admitting: Family Medicine

## 2016-06-17 DIAGNOSIS — H25813 Combined forms of age-related cataract, bilateral: Secondary | ICD-10-CM | POA: Diagnosis not present

## 2016-07-23 ENCOUNTER — Ambulatory Visit (INDEPENDENT_AMBULATORY_CARE_PROVIDER_SITE_OTHER): Payer: Medicare PPO | Admitting: Family Medicine

## 2016-07-23 VITALS — BP 108/60 | HR 100 | Temp 99.3°F | Resp 18 | Wt 174.0 lb

## 2016-07-23 DIAGNOSIS — Z789 Other specified health status: Secondary | ICD-10-CM | POA: Diagnosis not present

## 2016-07-23 DIAGNOSIS — I10 Essential (primary) hypertension: Secondary | ICD-10-CM | POA: Diagnosis not present

## 2016-07-23 DIAGNOSIS — F3289 Other specified depressive episodes: Secondary | ICD-10-CM | POA: Diagnosis not present

## 2016-07-23 DIAGNOSIS — N289 Disorder of kidney and ureter, unspecified: Secondary | ICD-10-CM | POA: Diagnosis not present

## 2016-07-23 DIAGNOSIS — M549 Dorsalgia, unspecified: Secondary | ICD-10-CM | POA: Diagnosis not present

## 2016-07-23 DIAGNOSIS — E7800 Pure hypercholesterolemia, unspecified: Secondary | ICD-10-CM | POA: Diagnosis not present

## 2016-07-23 DIAGNOSIS — G8929 Other chronic pain: Secondary | ICD-10-CM | POA: Diagnosis not present

## 2016-07-23 DIAGNOSIS — J069 Acute upper respiratory infection, unspecified: Secondary | ICD-10-CM

## 2016-07-23 MED ORDER — TRIAMTERENE-HCTZ 37.5-25 MG PO CAPS: 1.0000 | ORAL_CAPSULE | Freq: Every day | ORAL | 3 refills | Status: DC

## 2016-07-23 MED ORDER — AZITHROMYCIN 250 MG PO TABS: ORAL_TABLET | ORAL | 0 refills | Status: DC

## 2016-07-23 MED ORDER — LOSARTAN POTASSIUM 50 MG PO TABS: 50.0000 mg | ORAL_TABLET | Freq: Every day | ORAL | 3 refills | Status: DC

## 2016-07-23 NOTE — Progress Notes (Signed)
Victoria Dean  MRN: [id number] DOB: 03-Apr-1950  Subjective:  HPI  Patient is here for follow up. Last routine visit was on 01/24/16 for CPE. Routine lab work was done at that time and results showed cholesterol was elevated and patient is statin intolerant so Zetia was started. Also mild decrease in kidney function was noted at that time. Patient took Zetia for about 3 months and had to stop due to leg pain and upset stomach with taking this medication. Lab Results  Component Value Date   CHOL 222 (H) 01/24/2016   HDL 58 01/24/2016   LDLCALC 141 (H) 01/24/2016   TRIG 114 01/24/2016   B/P: patient checks it occasionally. No cardiac symptoms. BP Readings from Last 3 Encounters:  07/23/16 108/60  04/04/16 104/64  03/05/16 130/72   Depression: patient states she feels well. She is taking Venlafaxine. Depression screen [facility] 2/9 07/23/2016 01/24/2016 11/22/2014  Decreased Interest 1 2 0  Down, Depressed, Hopeless 0 3 0  PHQ - 2 Score 1 5 0  Altered sleeping 0 1 1  Tired, decreased energy 1 3 1   Change in appetite 0 1 0  Feeling bad or failure about yourself  0 0 0  Trouble concentrating 0 0 0  Moving slowly or fidgety/restless 0 1 0  Suicidal thoughts 0 0 0  PHQ-9 Score 2 11 2   Difficult doing work/chores - Somewhat difficult Not difficult at all   Chronic back pain: patient had surgery 04/21/16 and has not had to take any opiods since then. Pain is much better. She just takes Tylenol when need it.   Patient is not feeling well today and feels like she probably got her symptoms from her granddaughter who has been diagnosed as of now with a virus but did have to go to the hospital because of feeling sick. Patient is having cough, chest congestion, sinus pain and pressure, fever of 100 to 100.2 yesterday and today at 12pm it was 99.9 for which she took Tylenol, having headache.  Patient Active Problem List   Diagnosis Date Noted  . Actinic keratosis 11/21/2014  . Adaptation  reaction 11/21/2014  . Allergic rhinitis 11/21/2014  . Absolute anemia 11/21/2014  . Pulmonary collapse 11/21/2014  . Bell palsy 11/21/2014  . Cornu cutaneum 11/21/2014  . Clinical depression 11/21/2014  . Dermatitis, eczematoid 11/21/2014  . Abnormal LFTs 11/21/2014  . Enthesopathy 11/21/2014  . Essential (primary) hypertension 11/21/2014  . Anxiety, generalized 11/21/2014  . Hemorrhoids 11/21/2014  . Herniated nucleus pulposus 11/21/2014  . Mild episode of recurrent major depressive disorder ([place]) 11/21/2014  . Adiposity 11/21/2014  . Arthritis, degenerative 11/21/2014  . Basal cell papilloma 11/21/2014  . Apnea, sleep 11/21/2014  . Avitaminosis D 11/21/2014    Past Medical History:  Diagnosis Date  . Hypercholesteremia   . Hypertension     Social History   Social History  . Marital status: Married    Spouse name: N/A  . Number of children: N/A  . Years of education: N/A   Occupational History  . Not on file.   Social History Main Topics  . Smoking status: Current Every Day Smoker    Packs/day: 0.25  . Smokeless tobacco: Never Used     Comment: to half pack  . Alcohol use Yes     Comment: Occasionally  . Drug use: No  . Sexual activity: Not Currently   Other Topics Concern  . Not on file   Social History Narrative  . No narrative  on file    Outpatient Encounter Prescriptions as of 07/23/2016  Medication Sig  . Acetaminophen (TYLENOL ARTHRITIS PAIN PO) Take by mouth.  . Cholecalciferol (VITAMIN D3) 3000 units TABS Take by mouth.  . losartan (COZAAR) 50 MG tablet TAKE 1 TABLET EVERY DAY  . triamterene-hydrochlorothiazide (DYAZIDE) 37.5-25 MG capsule TAKE 1 CAPSULE EVERY DAY  . venlafaxine (EFFEXOR) 75 MG tablet TAKE 1 TABLET (75 MG TOTAL) BY MOUTH DAILY.  . diazepam (VALIUM) 5 MG tablet Take 1 tablet (5 mg total) by mouth at bedtime as needed for anxiety. (Patient not taking: Reported on 07/23/2016)  . [DISCONTINUED] amoxicillin (AMOXIL) 875 MG tablet  Take 1 tablet (875 mg total) by mouth 2 (two) times daily.  . [DISCONTINUED] docusate sodium (STOOL SOFTENER) 100 MG capsule Take 100 mg by mouth daily.  . [DISCONTINUED] ezetimibe (ZETIA) 10 MG tablet Take 1 tablet (10 mg total) by mouth daily.  . [DISCONTINUED] gabapentin (NEURONTIN) 300 MG capsule   . [DISCONTINUED] HYDROcodone-acetaminophen (NORCO) 10-325 MG tablet Take 1 tablet by mouth every 4 (four) hours as needed. (Patient not taking: Reported on 04/04/2016)  . [DISCONTINUED] naproxen sodium (ANAPROX) 220 MG tablet Take 220 mg by mouth 2 (two) times daily with a meal.  . [DISCONTINUED] Oxycodone HCl 10 MG TABS Take 1 tablet (10 mg total) by mouth every 4 (four) hours as needed.   No facility-administered encounter medications on file as of 07/23/2016.     Allergies  Allergen Reactions  . Statins     Muscle weakness  . Sulfa Antibiotics   . Zetia [Ezetimibe]     Leg pain, upset stomach    Review of Systems  Constitutional: Positive for fever and malaise/fatigue.  HENT: Positive for congestion, ear pain, sinus pain and sore throat.   Respiratory: Positive for cough and sputum production.   Gastrointestinal: Negative.   Musculoskeletal: Positive for back pain, joint pain and myalgias.  Skin: Negative.   Neurological: Positive for headaches.    Objective:  BP 108/60   Pulse 100   Temp 99.3 F (37.4 C)   Resp 18   Wt 174 lb (78.9 kg)   SpO2 95%   BMI 30.82 kg/m   Physical Exam  Constitutional: She is oriented to person, place, and time and well-developed, well-nourished, and in no distress.  HENT:  Head: Normocephalic and atraumatic.  Eyes: Conjunctivae are normal. Pupils are equal, round, and reactive to light.  Neck: Normal range of motion. Neck supple.  Cardiovascular: Normal rate, regular rhythm, normal heart sounds and intact distal pulses.  Exam reveals no gallop.   No murmur heard. Pulmonary/Chest: Effort normal and breath sounds normal. No respiratory  distress. She has no wheezes.  Neurological: She is alert and oriented to person, place, and time.  Psychiatric: Mood, memory, affect and judgment normal.    Assessment and Plan :  1. Essential (primary) hypertension Stable. Refills given. Follow. - Renal Function Panel  2. Pure hypercholesterolemia Did not tolerate Zetia or statins. Follow without medication.  3. Chronic back pain, unspecified back location, unspecified back pain laterality Resolved since lumbar laminectomy. No opioid use. Stable. Doing well.  4. Other depression Stable.  5. Statin intolerance  6. Acute URI Will treat with Zpak. Follow as needed.  7. Function kidney decreased Repeat Renal today. If levels are still abnormal may need to refer to nephrologist for further work up.  HPI, Exam and A&P transcribed by Theressa Millard, RMA under direction and in the presence of Miguel Aschoff, MD. I  have done the exam and reviewed the chart and it is accurate to the best of my knowledge. Development worker, community has been used and  any errors in dictation or transcription are unintentional. Miguel Aschoff M.D. [facility]

## 2016-07-24 LAB — RENAL FUNCTION PANEL
Albumin: 4.7 g/dL (ref 3.6–4.8)
BUN/Creatinine Ratio: 13 (ref 12–28)
BUN: 14 mg/dL (ref 8–27)
CO2: 28 mmol/L (ref 20–29)
CREATININE: 1.08 mg/dL — AB (ref 0.57–1.00)
Calcium: 10.1 mg/dL (ref 8.7–10.3)
Chloride: 92 mmol/L — ABNORMAL LOW (ref 96–106)
GFR calc Af Amer: 61 mL/min/{1.73_m2} (ref >59)
GFR, EST NON AFRICAN AMERICAN: 53 mL/min/{1.73_m2} — AB (ref >59)
GLUCOSE: 104 mg/dL — AB (ref 65–99)
PHOSPHORUS: 4 mg/dL (ref 2.5–4.5)
POTASSIUM: 3.9 mmol/L (ref 3.5–5.2)
SODIUM: 137 mmol/L (ref 134–144)

## 2016-07-25 NOTE — Progress Notes (Signed)
Advised  ED 

## 2016-09-29 DIAGNOSIS — L570 Actinic keratosis: Secondary | ICD-10-CM | POA: Diagnosis not present

## 2016-09-29 DIAGNOSIS — C44629 Squamous cell carcinoma of skin of left upper limb, including shoulder: Secondary | ICD-10-CM | POA: Diagnosis not present

## 2016-09-29 DIAGNOSIS — D485 Neoplasm of uncertain behavior of skin: Secondary | ICD-10-CM | POA: Diagnosis not present

## 2016-09-29 DIAGNOSIS — X32XXXA Exposure to sunlight, initial encounter: Secondary | ICD-10-CM | POA: Diagnosis not present

## 2016-11-12 ENCOUNTER — Ambulatory Visit (INDEPENDENT_AMBULATORY_CARE_PROVIDER_SITE_OTHER): Payer: Medicare PPO

## 2016-11-12 VITALS — BP 136/76 | HR 86 | Temp 99.0°F | Resp 16 | Ht 63.0 in | Wt 183.0 lb

## 2016-11-12 DIAGNOSIS — Z0000 Encounter for general adult medical examination without abnormal findings: Secondary | ICD-10-CM | POA: Diagnosis not present

## 2016-11-12 DIAGNOSIS — Z1231 Encounter for screening mammogram for malignant neoplasm of breast: Secondary | ICD-10-CM | POA: Diagnosis not present

## 2016-11-12 DIAGNOSIS — Z23 Encounter for immunization: Secondary | ICD-10-CM

## 2016-11-12 DIAGNOSIS — Z1239 Encounter for other screening for malignant neoplasm of breast: Secondary | ICD-10-CM

## 2016-11-12 NOTE — Progress Notes (Signed)
Subjective:   Victoria Dean is a 66 y.o. female who presents for Medicare Annual (Subsequent) preventive examination.  Review of Systems:  N/A  Cardiac Risk Factors include: advanced age (>35men, >1 women);obesity (BMI >30kg/m2);hypertension     Objective:     Vitals: BP 136/76 (BP Location: Right Arm, Patient Position: Sitting, Cuff Size: Normal)   Pulse 86   Temp 99 F (37.2 C)   Resp 16   Ht 5\' 3"  (1.6 m)   Wt 183 lb (83 kg)   BMI 32.42 kg/m   Body mass index is 32.42 kg/m.   Tobacco History  Smoking Status  . Current Every Day Smoker  . Packs/day: 0.25  Smokeless Tobacco  . Never Used    Comment: to half pack     Ready to quit: No Counseling given: No   Past Medical History:  Diagnosis Date  . Cataract   . Hypercholesteremia   . Hypertension   . Major depressive disorder   . 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 Vitamin D deficiency    Past Surgical History:  Procedure Laterality Date  . ABDOMINAL HYSTERECTOMY  1995  . APPENDECTOMY  1994  . ESI    . MICRODISCECTOMY LUMBAR  2008   L4-5  . SQUAMOUS CELL CARCINOMA EXCISION    . TONSILLECTOMY  1971   Family History  Problem Relation Age of Onset  . Cancer Mother        Bone and breast cancer  . Hypertension Brother   . COPD Father   . Hypertension Father    History  Sexual Activity  . Sexual activity: Not Currently    Outpatient Encounter Prescriptions as of 11/12/2016  Medication Sig  . Cholecalciferol (VITAMIN D3) 3000 units TABS Take by mouth.  . losartan (COZAAR) 50 MG tablet Take 1 tablet (50 mg total) by mouth daily.  Marland Kitchen triamterene-hydrochlorothiazide (DYAZIDE) 37.5-25 MG capsule Take 1 each (1 capsule total) by mouth daily.  Marland Kitchen venlafaxine (EFFEXOR) 75 MG tablet TAKE 1 TABLET (75 MG TOTAL) BY MOUTH DAILY.  Marland Kitchen Acetaminophen (TYLENOL ARTHRITIS PAIN PO) Take by mouth as needed (pain).   . diazepam (VALIUM) 5 MG tablet Take 1 tablet (5  mg total) by mouth at bedtime as needed for anxiety. (Patient not taking: Reported on 07/23/2016)  . [DISCONTINUED] azithromycin (ZITHROMAX) 250 MG tablet As directed   No facility-administered encounter medications on file as of 11/12/2016.     Activities of Daily Living In your present state of health, do you have any difficulty performing the following activities: 11/12/2016 01/24/2016  Hearing? N N  Vision? N N  Difficulty concentrating or making decisions? N N  Walking or climbing stairs? N Y  Comment - due to pain in back  Dressing or bathing? N N  Doing errands, shopping? N N  Preparing Food and eating ? N N  Using the Toilet? N N  In the past six months, have you accidently leaked urine? N N  Do you have problems with loss of bowel control? N N  Managing your Medications? N N  Managing your Finances? N N  Housekeeping or managing your Housekeeping? N Y  Some recent data might be hidden    Patient Care Team: Jerrol Banana., MD as PCP - General (Family Medicine) Lorelee Cover., MD as Consulting Physician (Ophthalmology) Dimmig, Marcello Moores, MD as Referring Physician (Orthopedic Surgery) Eulas Post  Brooke Bonito., MD (Family Medicine) Christene Lye, MD (General Surgery)    Assessment:     Exercise Activities and Dietary recommendations Current Exercise Habits: Structured exercise class, Type of exercise: walking, Time (Minutes): 60, Frequency (Times/Week): 7, Weekly Exercise (Minutes/Week): 420, Intensity: Mild  Goals    . Increase water intake          Starting 01/24/16, I will continue drinking 4-6 glasses of water a day.    . Prevent Falls          Recommend to remove tripping hazards from home or request assistance with taking the dog outside      [place]  11/12/2016 01/24/2016 11/22/2014  Falls in the past year? Yes No No  Number falls in past yr: 2 or more - -  Injury with Fall? No - -  Risk for fall due to : Impaired vision - -    Risk for fall due to: Comment wears eye glasses - -  Follow up Education provided;Falls prevention discussed - -   Depression Screen PHQ 2/9 Scores 11/12/2016 07/23/2016 01/24/2016 11/22/2014  PHQ - 2 Score 2 1 5  0  PHQ- 9 Score 3 2 11 2      Cognitive Function: declined     6CIT Screen 01/24/2016  What Year? 0 points  What month? 0 points  What time? 0 points  Count back from 20 0 points  Months in reverse 0 points  Repeat phrase 0 points  Total Score 0    Immunization History  Administered Date(s) Administered  . Hepatitis B 06/12/1995, 07/15/1995, 12/15/1995  . Influenza Split 01/01/2011, 11/24/2012  . Influenza, High Dose Seasonal PF 11/22/2014, 10/23/2015, 11/12/2016  . Influenza,inj,Quad PF,6+ Mos 11/13/2013  . Pneumococcal Conjugate-13 10/11/2014  . Pneumococcal Polysaccharide-23 11/08/2008, 01/24/2016  . Pneumococcal-Unspecified 01/04/2015  . Td 06/21/2004  . Tdap 03/23/2014  . Zoster 11/13/2013   Screening Tests Health Maintenance  Topic Date Due  . MAMMOGRAM  04/25/2016  . COLONOSCOPY  01/23/2026 (Originally 12/26/1998)  . TETANUS/TDAP  03/23/2024  . INFLUENZA VACCINE  Completed  . DEXA SCAN  Completed  . Hepatitis C Screening  Completed  . PNA vac Low Risk Adult  Completed      Plan:    I have personally reviewed and addressed the Medicare Annual Wellness questionnaire and have noted the following in the patient's chart:  A. Medical and social history B. Use of alcohol, tobacco or illicit drugs  C. Current medications and supplements D. Functional ability and status E.  Nutritional status F.  Physical activity G. Advance directives H. List of other physicians I.  Hospitalizations, surgeries, and ER visits in previous 12 months J.  [place] such as hearing and vision if needed, cognitive and depression L. Referrals and appointments - none  In addition, I have reviewed and discussed with patient certain preventive protocols, quality  metrics, and best practice recommendations. A written personalized care plan for preventive services as well as general preventive health recommendations were provided to patient.  See attached scanned questionnaire for additional information.   Signed,  Aleatha Borer, LPN Nurse Health Advisor   MD Recommendations: Due for Colonoscopy. Requesting to discuss Cologuard vs Colonoscopy. Mammogram ordered today. Pt to call and schedule.

## 2016-11-12 NOTE — Patient Instructions (Signed)
Victoria Dean , Thank you for taking time to come for your Medicare Wellness Visit. I appreciate your ongoing commitment to your health goals. Please review the following plan we discussed and let me know if I can assist you in the future.   Screening recommendations/referrals: Colonoscopy: Declined. Requesting to discuss Cologuard with Dr. Rosanna Randy Mammogram: Ordered today. Please call [phone] to schedule your mammogram.  Bone Density: Completed 04/26/14 Recommended yearly ophthalmology/optometry visit for glaucoma screening and checkup Recommended yearly dental visit for hygiene and checkup  Vaccinations: Influenza vaccine: given today Pneumococcal vaccine: completed series Tdap vaccine: up to date Shingles vaccine: up to date  Advanced directives: Please bring a copy of your POA (Power of Attorney) and/or Living Will to your next appointment.   Conditions/risks identified: Recommend to remove tripping hazards from home or request assistance with taking the dog outside  Next appointment: Scheduled to see Dr. Rosanna Randy on 02/19/17 @ 10:00am. Follow up in one year for annual wellness exam.   Preventive Care 65 Years and Older, Female Preventive care refers to lifestyle choices and visits with your health care provider that can promote health and wellness. What does preventive care include?  A yearly physical exam. This is also called an annual well check.  Dental exams once or twice a year.  Routine eye exams. Ask your health care provider how often you should have your eyes checked.  Personal lifestyle choices, including:  Daily care of your teeth and gums.  Regular physical activity.  Eating a healthy diet.  Avoiding tobacco and drug use.  Limiting alcohol use.  Practicing safe sex.  Taking low-dose aspirin every day.  Taking vitamin and mineral supplements as recommended by your health care provider. What happens during an annual well check? The services and  screenings done by your health care provider during your annual well check will depend on your age, overall health, lifestyle risk factors, and family history of disease. Counseling  Your health care provider may ask you questions about your:  Alcohol use.  Tobacco use.  Drug use.  Emotional well-being.  Home and relationship well-being.  Sexual activity.  Eating habits.  History of falls.  Memory and ability to understand (cognition).  Work and work Statistician.  Reproductive health. Screening  You may have the following tests or measurements:  Height, weight, and BMI.  Blood pressure.  Lipid and cholesterol levels. These may be checked every 5 years, or more frequently if you are over 2 years old.  Skin check.  Lung cancer screening. You may have this screening every year starting at age 39 if you have a 30-pack-year history of smoking and currently smoke or have quit within the past 15 years.  Fecal occult blood test (FOBT) of the stool. You may have this test every year starting at age 80.  Flexible sigmoidoscopy or colonoscopy. You may have a sigmoidoscopy every 5 years or a colonoscopy every 10 years starting at age 34.  Hepatitis C blood test.  Hepatitis B blood test.  Sexually transmitted disease (STD) testing.  Diabetes screening. This is done by checking your blood sugar (glucose) after you have not eaten for a while (fasting). You may have this done every 1-3 years.  Bone density scan. This is done to screen for osteoporosis. You may have this done starting at age 72.  Mammogram. This may be done every 1-2 years. Talk to your health care provider about how often you should have regular mammograms. Talk with your health care provider  about your test results, treatment options, and if necessary, the need for more tests. Vaccines  Your health care provider may recommend certain vaccines, such as:  Influenza vaccine. This is recommended every  year.  Tetanus, diphtheria, and acellular pertussis (Tdap, Td) vaccine. You may need a Td booster every 10 years.  Zoster vaccine. You may need this after age 58.  Pneumococcal 13-valent conjugate (PCV13) vaccine. One dose is recommended after age 75.  Pneumococcal polysaccharide (PPSV23) vaccine. One dose is recommended after age 67. Talk to your health care provider about which screenings and vaccines you need and how often you need them. This information is not intended to replace advice given to you by your health care provider. Make sure you discuss any questions you have with your health care provider. Document Released: 02/16/2015 Document Revised: 10/10/2015 Document Reviewed: 11/21/2014 Elsevier Interactive Patient Education  2017 [employer] Prevention in the Home Falls can cause injuries. They can happen to people of all ages. There are many things you can do to make your home safe and to help prevent falls. What can I do on the outside of my home?  Regularly fix the edges of walkways and driveways and fix any cracks.  Remove anything that might make you trip as you walk through a door, such as a raised step or threshold.  Trim any bushes or trees on the path to your home.  Use bright outdoor lighting.  Clear any walking paths of anything that might make someone trip, such as rocks or tools.  Regularly check to see if handrails are loose or broken. Make sure that both sides of any steps have handrails.  Any raised decks and porches should have guardrails on the edges.  Have any leaves, snow, or ice cleared regularly.  Use sand or salt on walking paths during winter.  Clean up any spills in your garage right away. This includes oil or grease spills. What can I do in the bathroom?  Use night lights.  Install grab bars by the toilet and in the tub and shower. Do not use towel bars as grab bars.  Use non-skid mats or decals in the tub or shower.  If you  need to sit down in the shower, use a plastic, non-slip stool.  Keep the floor dry. Clean up any water that spills on the floor as soon as it happens.  Remove soap buildup in the tub or shower regularly.  Attach bath mats securely with double-sided non-slip rug tape.  Do not have throw rugs and other things on the floor that can make you trip. What can I do in the bedroom?  Use night lights.  Make sure that you have a light by your bed that is easy to reach.  Do not use any sheets or blankets that are too big for your bed. They should not hang down onto the floor.  Have a firm chair that has side arms. You can use this for support while you get dressed.  Do not have throw rugs and other things on the floor that can make you trip. What can I do in the kitchen?  Clean up any spills right away.  Avoid walking on wet floors.  Keep items that you use a lot in easy-to-reach places.  If you need to reach something above you, use a strong step stool that has a grab bar.  Keep electrical cords out of the way.  Do not use floor polish or  wax that makes floors slippery. If you must use wax, use non-skid floor wax.  Do not have throw rugs and other things on the floor that can make you trip. What can I do with my stairs?  Do not leave any items on the stairs.  Make sure that there are handrails on both sides of the stairs and use them. Fix handrails that are broken or loose. Make sure that handrails are as long as the stairways.  Check any carpeting to make sure that it is firmly attached to the stairs. Fix any carpet that is loose or worn.  Avoid having throw rugs at the top or bottom of the stairs. If you do have throw rugs, attach them to the floor with carpet tape.  Make sure that you have a light switch at the top of the stairs and the bottom of the stairs. If you do not have them, ask someone to add them for you. What else can I do to help prevent falls?  Wear shoes  that:  Do not have high heels.  Have rubber bottoms.  Are comfortable and fit you well.  Are closed at the toe. Do not wear sandals.  If you use a stepladder:  Make sure that it is fully opened. Do not climb a closed stepladder.  Make sure that both sides of the stepladder are locked into place.  Ask someone to hold it for you, if possible.  Clearly mark and make sure that you can see:  Any grab bars or handrails.  First and last steps.  Where the edge of each step is.  Use tools that help you move around (mobility aids) if they are needed. These include:  Canes.  Walkers.  Scooters.  Crutches.  Turn on the lights when you go into a dark area. Replace any light bulbs as soon as they burn out.  Set up your furniture so you have a clear path. Avoid moving your furniture around.  If any of your floors are uneven, fix them.  If there are any pets around you, be aware of where they are.  Review your medicines with your doctor. Some medicines can make you feel dizzy. This can increase your chance of falling. Ask your doctor what other things that you can do to help prevent falls. This information is not intended to replace advice given to you by your health care provider. Make sure you discuss any questions you have with your health care provider. Document Released: 11/16/2008 Document Revised: 06/28/2015 Document Reviewed: 02/24/2014 Elsevier Interactive Patient Education  2017 [employer].

## 2016-11-14 DIAGNOSIS — C44622 Squamous cell carcinoma of skin of right upper limb, including shoulder: Secondary | ICD-10-CM | POA: Diagnosis not present

## 2016-11-28 DIAGNOSIS — C44629 Squamous cell carcinoma of skin of left upper limb, including shoulder: Secondary | ICD-10-CM | POA: Diagnosis not present

## 2016-12-11 ENCOUNTER — Ambulatory Visit
Admission: RE | Admit: 2016-12-11 | Discharge: 2016-12-11 | Disposition: A | Discharge status: Home / Self Care | Payer: Medicare PPO | Source: Ambulatory Visit | Attending: Family Medicine | Admitting: Family Medicine

## 2016-12-11 DIAGNOSIS — Z1239 Encounter for other screening for malignant neoplasm of breast: Secondary | ICD-10-CM

## 2016-12-11 DIAGNOSIS — Z1231 Encounter for screening mammogram for malignant neoplasm of breast: Secondary | ICD-10-CM | POA: Diagnosis not present

## 2016-12-24 LAB — FECAL OCCULT BLOOD, GUAIAC: FECAL OCCULT BLD: POSITIVE

## 2016-12-24 LAB — COLOGUARD: COLOGUARD: POSITIVE

## 2017-01-06 ENCOUNTER — Other Ambulatory Visit (INDEPENDENT_AMBULATORY_CARE_PROVIDER_SITE_OTHER): Payer: Self-pay | Admitting: Family

## 2017-01-06 DIAGNOSIS — E118 Type 2 diabetes mellitus with unspecified complications: Secondary | ICD-10-CM

## 2017-01-07 NOTE — Telephone Encounter (Signed)
This medication is outside of the [facility]'s protocols. Please sign and close the encounter if you approve:    Last visit on 11/19/15 (> 1 year ago)  No current PCP listed.    If this medication is denied please have your staff inform the patient and schedule an appointment if necessary.

## 2017-01-08 NOTE — Telephone Encounter (Signed)
She needs to be seen in clinic; pls offer OV     Will send a refill; pls confirm pharmacy

## 2017-01-08 NOTE — Telephone Encounter (Signed)
Called and notified patient and she stated that she is no longer seeing in our office and she will try to have the pharmacy submit her refill to her current PCP.    Closing TE.

## 2017-01-29 DIAGNOSIS — H15122 Nodular episcleritis, left eye: Secondary | ICD-10-CM | POA: Diagnosis not present

## 2017-02-09 ENCOUNTER — Encounter: Payer: Self-pay | Admitting: Family Medicine

## 2017-02-09 ENCOUNTER — Ambulatory Visit: Payer: Medicare PPO | Admitting: Family Medicine

## 2017-02-09 VITALS — BP 118/68 | HR 92 | Temp 98.1°F | Resp 14 | Wt 178.0 lb

## 2017-02-09 DIAGNOSIS — F3289 Other specified depressive episodes: Secondary | ICD-10-CM

## 2017-02-09 DIAGNOSIS — M51369 Other intervertebral disc degeneration, lumbar region without mention of lumbar back pain or lower extremity pain: Secondary | ICD-10-CM

## 2017-02-09 DIAGNOSIS — M5136 Other intervertebral disc degeneration, lumbar region: Secondary | ICD-10-CM

## 2017-02-09 DIAGNOSIS — F411 Generalized anxiety disorder: Secondary | ICD-10-CM | POA: Diagnosis not present

## 2017-02-09 DIAGNOSIS — I10 Essential (primary) hypertension: Secondary | ICD-10-CM | POA: Diagnosis not present

## 2017-02-09 DIAGNOSIS — E7800 Pure hypercholesterolemia, unspecified: Secondary | ICD-10-CM

## 2017-02-09 MED ORDER — LOSARTAN POTASSIUM 50 MG PO TABS: 50.0000 mg | ORAL_TABLET | Freq: Every day | ORAL | 3 refills | Status: DC

## 2017-02-09 MED ORDER — DIAZEPAM 5 MG PO TABS: 5.0000 mg | ORAL_TABLET | Freq: Every evening | ORAL | 5 refills | Status: DC | PRN

## 2017-02-09 MED ORDER — VENLAFAXINE HCL 75 MG PO TABS: 75.0000 mg | ORAL_TABLET | Freq: Every day | ORAL | 3 refills | Status: DC

## 2017-02-09 MED ORDER — TRIAMTERENE-HCTZ 37.5-25 MG PO CAPS: 1.0000 | ORAL_CAPSULE | Freq: Every day | ORAL | 3 refills | Status: DC

## 2017-02-09 NOTE — Progress Notes (Signed)
Patient: Victoria Dean Female    DOB: March 10, 1950   67 y.o.   MRN: [id number] Visit Date: 02/09/2017  Today's Provider: [name], MD   Chief Complaint  Patient presents with  . Hypertension  . Hyperlipidemia   Subjective:    HPI  Hypertension, follow-up:  BP Readings from Last 3 Encounters:  02/09/17 118/68  11/12/16 136/76  07/23/16 108/60    She was last seen for hypertension 6 months ago.  BP at that visit was 136/76. Management since that visit includes none. She reports good compliance with treatment. She is not having side effects.  She is exercising. She is adherent to low salt diet.   Outside blood pressures are 120's/70's. Patient denies chest pain, chest pressure/discomfort, claudication, dyspnea, exertional chest pressure/discomfort, fatigue, irregular heart beat, lower extremity edema, near-syncope, orthopnea, palpitations, paroxysmal nocturnal dyspnea, syncope and tachypnea.   Cardiovascular risk factors include advanced age (older than 64 for men, 52 for women), dyslipidemia, hypertension, obesity (BMI >= 30 kg/m2) and smoking/ tobacco exposure.   Wt Readings from Last 3 Encounters:  02/09/17 178 lb (80.7 kg)  11/12/16 183 lb (83 kg)  07/23/16 174 lb (78.9 kg)   ------------------------------------------------------------------------ Pt again declines Colonoscopy and Cologaurd.    Lipid/Cholesterol, Follow-up:   Last seen for this 6 months ago.  Management changes since that visit include none. Pt is intolerant to Zetia and statins. Will follow with out treatment.  . Last Lipid Panel:    Component Value Date/Time   CHOL 222 (H) 01/24/2016 1209   TRIG 114 01/24/2016 1209   HDL 58 01/24/2016 1209   LDLCALC 141 (H) 01/24/2016 1209    Wt Readings from Last 3 Encounters:  02/09/17 178 lb (80.7 kg)  11/12/16 183 lb (83 kg)  07/23/16 174 lb (78.9 kg)    -------------------------------------------------------------------       Allergies  Allergen Reactions  . Statins     Muscle weakness  . Sulfa Antibiotics   . Zetia [Ezetimibe]     Leg pain, upset stomach     Current Outpatient Medications:  .  Acetaminophen (TYLENOL ARTHRITIS PAIN PO), Take by mouth as needed (pain). , Disp: , Rfl:  .  Cholecalciferol (VITAMIN D3) 3000 units TABS, Take by mouth., Disp: , Rfl:  .  diazepam (VALIUM) 5 MG tablet, Take 1 tablet (5 mg total) by mouth at bedtime as needed for anxiety., Disp: 30 tablet, Rfl: 5 .  losartan (COZAAR) 50 MG tablet, Take 1 tablet (50 mg total) by mouth daily., Disp: 90 tablet, Rfl: 3 .  triamterene-hydrochlorothiazide (DYAZIDE) 37.5-25 MG capsule, Take 1 each (1 capsule total) by mouth daily., Disp: 90 capsule, Rfl: 3 .  venlafaxine (EFFEXOR) 75 MG tablet, Take 1 tablet (75 mg total) by mouth daily., Disp: 90 tablet, Rfl: 3  Review of Systems  Constitutional: Negative.   HENT: Negative.   Eyes: Negative.   Respiratory: Negative.   Cardiovascular: Negative.   Gastrointestinal: Negative.   Endocrine: Negative.   Genitourinary: Negative.   Musculoskeletal: Positive for joint swelling.  Skin: Negative.   Allergic/Immunologic: Negative.   Neurological: Negative.   Hematological: Negative.   Psychiatric/Behavioral: Negative.     Social History   Tobacco Use  . Smoking status: Current Every Day Smoker    Packs/day: 0.50  . Smokeless tobacco: Never Used  Substance Use Topics  . Alcohol use: Yes    Comment: Occasionally   Objective:   BP 118/68 (BP Location: Left Arm,  Patient Position: Sitting, Cuff Size: Large)   Pulse 92   Temp 98.1 F (36.7 C) (Oral)   Resp 14   Wt 178 lb (80.7 kg)   BMI 31.53 kg/m  Vitals:   02/09/17 1008  BP: 118/68  Pulse: 92  Resp: 14  Temp: 98.1 F (36.7 C)  TempSrc: Oral  Weight: 178 lb (80.7 kg)     Physical Exam  Constitutional: She is oriented to person, place, and time. She appears well-developed and well-nourished.  HENT:  Head:  Normocephalic and atraumatic.  Eyes: Conjunctivae and EOM are normal. Pupils are equal, round, and reactive to light.  Neck: Normal range of motion. Neck supple.  Cardiovascular: Normal rate, regular rhythm, normal heart sounds and intact distal pulses.  Pulmonary/Chest: Effort normal and breath sounds normal.  Abdominal: Soft.  Musculoskeletal: Normal range of motion.  Neurological: She is alert and oriented to person, place, and time. She has normal reflexes.  Skin: Skin is warm and dry.  Very fair skin.  Psychiatric: She has a normal mood and affect. Her behavior is normal. Judgment and thought content normal.        Assessment & Plan:     1. Essential (primary) hypertension  - triamterene-hydrochlorothiazide (DYAZIDE) 37.5-25 MG capsule; Take 1 each (1 capsule total) by mouth daily.  Dispense: 90 capsule; Refill: 3 - losartan (COZAAR) 50 MG tablet; Take 1 tablet (50 mg total) by mouth daily.  Dispense: 90 tablet; Refill: 3 - CBC with Differential/Platelet - TSH  2. Pure hypercholesterolemia  - Lipid panel - Comprehensive metabolic panel  3. Other depression  - venlafaxine (EFFEXOR) 75 MG tablet; Take 1 tablet (75 mg total) by mouth daily.  Dispense: 90 tablet; Refill: 3  4. Anxiety, generalized  - venlafaxine (EFFEXOR) 75 MG tablet; Take 1 tablet (75 mg total) by mouth daily.  Dispense: 90 tablet; Refill: 3 - diazepam (VALIUM) 5 MG tablet; Take 1 tablet (5 mg total) by mouth at bedtime as needed for anxiety.  Dispense: 30 tablet; Refill: 5 5.Heme Positive stool Per insurance home visit. Pt refuses GI referral although she is aware this could be a cancer. 6.Chronic Back Pain/DDD Per surgery.    HPI, Exam, and A&P Transcribed under the direction and in the presence of Niah Heinle L. Cranford Mon, MD  Electronically Signed: Katina Dung, CMA I have done the exam and reviewed the above chart and it is accurate to the best of my knowledge. Development worker, community has been used in  this note in any air is in the dictation or transcription are unintentional.  [name], MD  [facility]

## 2017-02-13 ENCOUNTER — Encounter: Payer: Self-pay | Admitting: Emergency Medicine

## 2017-02-13 ENCOUNTER — Telehealth: Payer: Self-pay | Admitting: Emergency Medicine

## 2017-02-13 NOTE — Telephone Encounter (Signed)
Pt had a fecal occult and was positive for blood. Pt refused further follow up at her OV on 02/11/17.

## 2017-02-18 DIAGNOSIS — E7800 Pure hypercholesterolemia, unspecified: Secondary | ICD-10-CM | POA: Diagnosis not present

## 2017-02-18 DIAGNOSIS — I10 Essential (primary) hypertension: Secondary | ICD-10-CM | POA: Diagnosis not present

## 2017-02-19 ENCOUNTER — Telehealth: Payer: Self-pay | Admitting: Emergency Medicine

## 2017-02-19 LAB — CBC WITH DIFFERENTIAL/PLATELET
Basophils Absolute: 0 10*3/uL (ref 0.0–0.2)
Basos: 1 %
EOS (ABSOLUTE): 0.1 10*3/uL (ref 0.0–0.4)
EOS: 1 %
Hematocrit: 38.1 % (ref 34.0–46.6)
Hemoglobin: 13 g/dL (ref 11.1–15.9)
IMMATURE GRANULOCYTES: 0 %
Immature Grans (Abs): 0 10*3/uL (ref 0.0–0.1)
LYMPHS: 40 %
Lymphocytes Absolute: 2.9 10*3/uL (ref 0.7–3.1)
MCH: 29.9 pg (ref 26.6–33.0)
MCHC: 34.1 g/dL (ref 31.5–35.7)
MCV: 88 fL (ref 79–97)
Monocytes Absolute: 0.6 10*3/uL (ref 0.1–0.9)
Monocytes: 8 %
NEUTROS ABS: 3.6 10*3/uL (ref 1.4–7.0)
Neutrophils: 50 %
PLATELETS: 291 10*3/uL (ref 150–379)
RBC: 4.35 x10E6/uL (ref 3.77–5.28)
RDW: 13.9 % (ref 12.3–15.4)
WBC: 7.2 10*3/uL (ref 3.4–10.8)

## 2017-02-19 LAB — LIPID PANEL
Chol/HDL Ratio: 3.4 ratio (ref 0.0–4.4)
Cholesterol, Total: 203 mg/dL — ABNORMAL HIGH (ref 100–199)
HDL: 60 mg/dL (ref >39)
LDL Calculated: 123 mg/dL — ABNORMAL HIGH (ref 0–99)
Triglycerides: 101 mg/dL (ref 0–149)
VLDL Cholesterol Cal: 20 mg/dL (ref 5–40)

## 2017-02-19 LAB — COMPREHENSIVE METABOLIC PANEL
ALT: 16 IU/L (ref 0–32)
AST: 13 IU/L (ref 0–40)
Albumin/Globulin Ratio: 2.2 (ref 1.2–2.2)
Albumin: 4.6 g/dL (ref 3.6–4.8)
Alkaline Phosphatase: 126 IU/L — ABNORMAL HIGH (ref 39–117)
BUN/Creatinine Ratio: 22 (ref 12–28)
BUN: 19 mg/dL (ref 8–27)
Bilirubin Total: 0.4 mg/dL (ref 0.0–1.2)
CALCIUM: 9.9 mg/dL (ref 8.7–10.3)
CO2: 26 mmol/L (ref 20–29)
CREATININE: 0.85 mg/dL (ref 0.57–1.00)
Chloride: 99 mmol/L (ref 96–106)
GFR, EST AFRICAN AMERICAN: 81 mL/min/{1.73_m2} (ref >59)
GFR, EST NON AFRICAN AMERICAN: 71 mL/min/{1.73_m2} (ref >59)
GLUCOSE: 94 mg/dL (ref 65–99)
Globulin, Total: 2.1 g/dL (ref 1.5–4.5)
Potassium: 4.3 mmol/L (ref 3.5–5.2)
Sodium: 141 mmol/L (ref 134–144)
TOTAL PROTEIN: 6.7 g/dL (ref 6.0–8.5)

## 2017-02-19 LAB — TSH: TSH: 0.586 u[IU]/mL (ref 0.450–4.500)

## 2017-02-19 NOTE — Telephone Encounter (Signed)
LMTCB

## 2017-02-19 NOTE — Telephone Encounter (Signed)
Pt informed and voiced understanding of results. 

## 2017-02-19 NOTE — Telephone Encounter (Signed)
-----   Message from Jerrol Banana., MD sent at 02/19/2017  9:50 AM EST ----- stable

## 2017-03-03 DIAGNOSIS — L570 Actinic keratosis: Secondary | ICD-10-CM | POA: Diagnosis not present

## 2017-03-03 DIAGNOSIS — L218 Other seborrheic dermatitis: Secondary | ICD-10-CM | POA: Diagnosis not present

## 2017-03-03 DIAGNOSIS — D0461 Carcinoma in situ of skin of right upper limb, including shoulder: Secondary | ICD-10-CM | POA: Diagnosis not present

## 2017-03-03 DIAGNOSIS — D225 Melanocytic nevi of trunk: Secondary | ICD-10-CM | POA: Diagnosis not present

## 2017-03-03 DIAGNOSIS — D2271 Melanocytic nevi of right lower limb, including hip: Secondary | ICD-10-CM | POA: Diagnosis not present

## 2017-03-03 DIAGNOSIS — L820 Inflamed seborrheic keratosis: Secondary | ICD-10-CM | POA: Diagnosis not present

## 2017-03-03 DIAGNOSIS — D485 Neoplasm of uncertain behavior of skin: Secondary | ICD-10-CM | POA: Diagnosis not present

## 2017-03-03 DIAGNOSIS — Z85828 Personal history of other malignant neoplasm of skin: Secondary | ICD-10-CM | POA: Diagnosis not present

## 2017-03-03 DIAGNOSIS — X32XXXA Exposure to sunlight, initial encounter: Secondary | ICD-10-CM | POA: Diagnosis not present

## 2017-03-06 ENCOUNTER — Ambulatory Visit: Payer: Medicare PPO | Admitting: Family Medicine

## 2017-03-06 ENCOUNTER — Encounter: Payer: Self-pay | Admitting: Family Medicine

## 2017-03-06 VITALS — BP 136/72 | HR 89 | Temp 99.8°F | Wt 181.2 lb

## 2017-03-06 DIAGNOSIS — R05 Cough: Secondary | ICD-10-CM | POA: Diagnosis not present

## 2017-03-06 DIAGNOSIS — J0100 Acute maxillary sinusitis, unspecified: Secondary | ICD-10-CM

## 2017-03-06 DIAGNOSIS — R52 Pain, unspecified: Secondary | ICD-10-CM

## 2017-03-06 DIAGNOSIS — R059 Cough, unspecified: Secondary | ICD-10-CM

## 2017-03-06 LAB — POCT INFLUENZA A/B
Influenza A, POC: NEGATIVE
Influenza B, POC: NEGATIVE

## 2017-03-06 MED ORDER — AMOXICILLIN-POT CLAVULANATE 875-125 MG PO TABS: 1.0000 | ORAL_TABLET | Freq: Two times a day (BID) | ORAL | 0 refills | Status: DC

## 2017-03-06 MED ORDER — HYDROCODONE-HOMATROPINE 5-1.5 MG/5ML PO SYRP: 5.0000 mL | ORAL_SOLUTION | Freq: Three times a day (TID) | ORAL | 0 refills | Status: DC | PRN

## 2017-03-06 NOTE — Progress Notes (Signed)
Patient: Victoria Dean Female    DOB: 09-Jun-1950   67 y.o.   MRN: [id number] Visit Date: 03/06/2017  Today's Provider: Vernie Murders, PA   Chief Complaint  Patient presents with  . URI   Subjective:    URI   This is a new problem. Episode onset: 2 days  The problem has been gradually worsening. Maximum temperature: 99.5. Associated symptoms include congestion, coughing, headaches, nausea, rhinorrhea, sinus pain, sneezing and vomiting. Associated symptoms comments: Body aches, fever, nose bleed, and eye pain. Treatments tried: Cloricidin    Past Medical History:  Diagnosis Date  . Cataract   . Hypercholesteremia   . Hypertension   . Major depressive disorder   . 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 Vitamin D deficiency    Past Surgical History:  Procedure Laterality Date  . ABDOMINAL HYSTERECTOMY  1995  . APPENDECTOMY  1994  . BREAST BIOPSY Right 1998  . ESI    . MICRODISCECTOMY LUMBAR  2008   L4-5  . SQUAMOUS CELL CARCINOMA EXCISION    . TONSILLECTOMY  1971   Family History  Problem Relation Age of Onset  . Cancer Mother        Bone and breast cancer  . Breast cancer Mother 43  . Hypertension Brother   . COPD Father   . Hypertension Father    Allergies  Allergen Reactions  . Statins     Muscle weakness  . Sulfa Antibiotics   . Zetia [Ezetimibe]     Leg pain, upset stomach    Current Outpatient Medications:  .  Acetaminophen (TYLENOL ARTHRITIS PAIN PO), Take by mouth as needed (pain). , Disp: , Rfl:  .  Cholecalciferol (VITAMIN D3) 3000 units TABS, Take by mouth., Disp: , Rfl:  .  diazepam (VALIUM) 5 MG tablet, Take 1 tablet (5 mg total) by mouth at bedtime as needed for anxiety., Disp: 30 tablet, Rfl: 5 .  losartan (COZAAR) 50 MG tablet, Take 1 tablet (50 mg total) by mouth daily., Disp: 90 tablet, Rfl: 3 .  triamterene-hydrochlorothiazide (DYAZIDE) 37.5-25 MG capsule, Take 1 each (1 capsule total)  by mouth daily., Disp: 90 capsule, Rfl: 3 .  venlafaxine (EFFEXOR) 75 MG tablet, Take 1 tablet (75 mg total) by mouth daily., Disp: 90 tablet, Rfl: 3  Review of Systems  Constitutional: Negative.   HENT: Positive for congestion, rhinorrhea, sinus pain and sneezing.   Respiratory: Positive for cough.   Cardiovascular: Negative.   Gastrointestinal: Positive for nausea and vomiting.  Neurological: Positive for headaches.   Social History   Tobacco Use  . Smoking status: Current Every Day Smoker    Packs/day: 0.50  . Smokeless tobacco: Never Used  Substance Use Topics  . Alcohol use: Yes    Comment: Occasionally   Objective:   BP 136/72 (BP Location: Right Arm, Patient Position: Sitting, Cuff Size: Normal)   Pulse 89   Temp 99.8 F (37.7 C) (Oral)   Wt 181 lb 3.2 oz (82.2 kg)   SpO2 95%   BMI 32.10 kg/m   Physical Exam  Constitutional: She is oriented to person, place, and time. She appears well-developed and well-nourished. No distress.  HENT:  Head: Normocephalic and atraumatic.  Right Ear: Hearing normal.  Left Ear: Hearing and external ear normal.  Nose: Nose normal.  Dull right TM with red streak. Slightly cobblestoning of posterior pharynx without  exudates. Tender and no transillumination of maxillary sinuses.  Eyes: Conjunctivae and lids are normal. Right eye exhibits no discharge. Left eye exhibits no discharge. No scleral icterus.  Cardiovascular: Normal rate and regular rhythm.  Pulmonary/Chest: Effort normal. No respiratory distress. She has no wheezes. She has no rales.  Abdominal: Soft.  Musculoskeletal: Normal range of motion.  Lymphadenopathy:    She has no cervical adenopathy.  Neurological: She is alert and oriented to person, place, and time.  Skin: Skin is intact. No lesion and no rash noted.  Psychiatric: She has a normal mood and affect. Her speech is normal and behavior is normal. Thought content normal.      Assessment & Plan:     1. Acute  maxillary sinusitis, recurrence not specified Onset 2 days ago with sinus tenderness, PND, sore throat, body aches, chills and cough. Some low grade fever. Flu test negative. Tender maxillary and ethmoid sinus region with no transillumination. Given cough syrup and Augmentin. Home to rest and increase fluid intake. May use Tylenol prn.  - amoxicillin-clavulanate (AUGMENTIN) 875-125 MG tablet; Take 1 tablet by mouth 2 (two) times daily.  Dispense: 20 tablet; Refill: 0  2. Cough Occasional sputum but cough caused her to gag and vomit once yesterday. Nasal congestion, sore throat and body aches. Treated with antibiotic for sinusitis and given Hycodan for cough control. Recheck prn. - HYDROcodone-homatropine (HYCODAN) 5-1.5 MG/5ML syrup; Take 5 mLs by mouth every 8 (eight) hours as needed for cough.  Dispense: 120 mL; Refill: 0 - POCT Influenza A/B  3. Generalized body aches Negative flu test but having cough, chills and body aches over the past 2 days. Increase fluid intake and given cough syrup. Home to rest and recheck prn. - POCT Influenza A/B       Vernie Murders, PA  [facility]

## 2017-03-24 DIAGNOSIS — D0461 Carcinoma in situ of skin of right upper limb, including shoulder: Secondary | ICD-10-CM | POA: Diagnosis not present

## 2017-08-03 DIAGNOSIS — H25813 Combined forms of age-related cataract, bilateral: Secondary | ICD-10-CM | POA: Diagnosis not present

## 2017-08-10 ENCOUNTER — Ambulatory Visit: Payer: Medicare PPO | Admitting: Family Medicine

## 2017-08-10 VITALS — BP 128/64 | HR 76 | Temp 98.0°F | Resp 14 | Wt 181.0 lb

## 2017-08-10 DIAGNOSIS — F3289 Other specified depressive episodes: Secondary | ICD-10-CM

## 2017-08-10 DIAGNOSIS — Z9889 Other specified postprocedural states: Secondary | ICD-10-CM

## 2017-08-10 DIAGNOSIS — I10 Essential (primary) hypertension: Secondary | ICD-10-CM | POA: Diagnosis not present

## 2017-08-10 NOTE — Progress Notes (Signed)
Victoria Dean  MRN: [id number] DOB: May 23, 1950  Subjective:  HPI   The patient is a 67 year old female who presents for follow up of chronic health.  She was last seen in February for an acute visit but seen on 02/09/17 for chronic issues.  1. Essential (primary) hypertension The patient has had stable readings on her last few visits.   BP Readings from Last 3 Encounters:  08/10/17 128/64  03/06/17 136/72  02/09/17 118/68    2. Other depression/Anxiety This was improved on her last visit with her PHQ9 score of 3.  Depression screen [facility] 2/9 08/10/2017 11/12/2016 07/23/2016 01/24/2016 11/22/2014  Decreased Interest 0 1 1 2  0  Down, Depressed, Hopeless 0 1 0 3 0  PHQ - 2 Score 0 2 1 5  0  Altered sleeping 0 0 0 1 1  Tired, decreased energy 1 1 1 3 1   Change in appetite 0 0 0 1 0  Feeling bad or failure about yourself  0 0 0 0 0  Trouble concentrating 0 0 0 0 0  Moving slowly or fidgety/restless 0 0 0 1 0  Suicidal thoughts 0 0 0 0 0  PHQ-9 Score 1 3 2 11 2   Difficult doing work/chores Not difficult at all Not difficult at all - Somewhat difficult Not difficult at all     Patient Active Problem List   Diagnosis Date Noted  . History of lumbar laminectomy 04/30/2016  . Actinic keratosis 11/21/2014  . Adaptation reaction 11/21/2014  . Allergic rhinitis 11/21/2014  . Absolute anemia 11/21/2014  . Cornu cutaneum 11/21/2014  . Clinical depression 11/21/2014  . Dermatitis, eczematoid 11/21/2014  . Abnormal LFTs 11/21/2014  . Enthesopathy 11/21/2014  . Essential (primary) hypertension 11/21/2014  . Anxiety, generalized 11/21/2014  . Herniated nucleus pulposus 11/21/2014  . Mild episode of recurrent major depressive disorder ([place]) 11/21/2014  . Adiposity 11/21/2014  . Arthritis, degenerative 11/21/2014  . Basal cell papilloma 11/21/2014  . Apnea, sleep 11/21/2014  . Avitaminosis D 11/21/2014    Past Medical History:  Diagnosis Date  . Cataract   . Hypercholesteremia     . Hypertension   . Major depressive disorder   . 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 Vitamin D deficiency     Social History   Socioeconomic History  . Marital status: Married    Spouse name: Not on file  . Number of children: Not on file  . Years of education: Not on file  . Highest education level: Not on file  Occupational History  . Not on file  Social Needs  . Financial resource strain: Not on file  . Food insecurity:    Worry: Not on file    Inability: Not on file  . Transportation needs:    Medical: Not on file    Non-medical: Not on file  Tobacco Use  . Smoking status: Current Every Day Smoker    Packs/day: 0.50  . Smokeless tobacco: Never Used  Substance and Sexual Activity  . Alcohol use: Yes    Comment: Occasionally  . Drug use: No  . Sexual activity: Not Currently  Lifestyle  . Physical activity:    Days per week: Not on file    Minutes per session: Not on file  . Stress: Not on file  Relationships  . Social connections:    Talks on phone: Not on file  Gets together: Not on file    Attends religious service: Not on file    Active member of club or organization: Not on file    Attends meetings of clubs or organizations: Not on file    Relationship status: Not on file  . Intimate partner violence:    Fear of current or ex partner: Not on file    Emotionally abused: Not on file    Physically abused: Not on file    Forced sexual activity: Not on file  Other Topics Concern  . Not on file  Social History Narrative  . Not on file    Outpatient Encounter Medications as of 08/10/2017  Medication Sig  . Acetaminophen (TYLENOL ARTHRITIS PAIN PO) Take by mouth as needed (pain).   . Cholecalciferol (VITAMIN D3) 3000 units TABS Take by mouth.  . diazepam (VALIUM) 5 MG tablet Take 1 tablet (5 mg total) by mouth at bedtime as needed for anxiety.  Marland Kitchen losartan (COZAAR) 50 MG tablet Take 1 tablet (50 mg  total) by mouth daily.  Marland Kitchen triamterene-hydrochlorothiazide (DYAZIDE) 37.5-25 MG capsule Take 1 each (1 capsule total) by mouth daily.  Marland Kitchen venlafaxine (EFFEXOR) 75 MG tablet Take 1 tablet (75 mg total) by mouth daily.  . [DISCONTINUED] amoxicillin-clavulanate (AUGMENTIN) 875-125 MG tablet Take 1 tablet by mouth 2 (two) times daily.  . [DISCONTINUED] HYDROcodone-homatropine (HYCODAN) 5-1.5 MG/5ML syrup Take 5 mLs by mouth every 8 (eight) hours as needed for cough.   No facility-administered encounter medications on file as of 08/10/2017.     Allergies  Allergen Reactions  . Statins     Muscle weakness  . Sulfa Antibiotics   . Zetia [Ezetimibe]     Leg pain, upset stomach    Review of Systems  Constitutional: Negative for fever and malaise/fatigue.  Eyes: Negative.   Respiratory: Negative for cough, shortness of breath and wheezing.   Cardiovascular: Negative for chest pain, palpitations, orthopnea, claudication and leg swelling.  Gastrointestinal: Negative.   Neurological: Negative.   Psychiatric/Behavioral: Negative for depression, hallucinations, memory loss, substance abuse and suicidal ideas. The patient is not nervous/anxious and does not have insomnia.     Objective:  BP 128/64 (BP Location: Right Arm, Patient Position: Sitting, Cuff Size: Normal)   Pulse 76   Temp 98 F (36.7 C) (Oral)   Resp 14   Wt 181 lb (82.1 kg)   BMI 32.06 kg/m   Physical Exam  Constitutional: She is well-developed, well-nourished, and in no distress.  HENT:  Head: Normocephalic and atraumatic.  Right Ear: External ear normal.  Left Ear: External ear normal.  Nose: Nose normal.    Assessment and Plan :  Essential (primary) hypertension  Other depression  Improved. Chronic Back Pain/DDD Much improved since last surgery. RTC 6 months.  I have done the exam and reviewed the chart and it is accurate to the best of my knowledge. Development worker, community has been used and  any errors in dictation or  transcription are unintentional. Miguel Aschoff M.D. [facility]

## 2017-08-11 ENCOUNTER — Other Ambulatory Visit: Payer: Self-pay | Admitting: Family Medicine

## 2017-08-11 DIAGNOSIS — F3289 Other specified depressive episodes: Secondary | ICD-10-CM

## 2017-08-11 DIAGNOSIS — F411 Generalized anxiety disorder: Secondary | ICD-10-CM

## 2017-08-11 MED ORDER — VENLAFAXINE HCL 75 MG PO TABS: 75.0000 mg | ORAL_TABLET | Freq: Every day | ORAL | 3 refills | Status: DC

## 2017-08-11 NOTE — Telephone Encounter (Signed)
Pt contacted office for refill request on the following medications:  venlafaxine (EFFEXOR) 75 MG tablet   [place]  90 day supply  Pt stated that [facility] advised her they don't show the Rx from 02/2017 and she doesn't have refills on the medication. Pt is requesting a new Rx be sent to [facility]. Please advise. Thanks TNP

## 2017-08-24 DIAGNOSIS — L538 Other specified erythematous conditions: Secondary | ICD-10-CM | POA: Diagnosis not present

## 2017-08-24 DIAGNOSIS — R208 Other disturbances of skin sensation: Secondary | ICD-10-CM | POA: Diagnosis not present

## 2017-08-24 DIAGNOSIS — L570 Actinic keratosis: Secondary | ICD-10-CM | POA: Diagnosis not present

## 2017-08-24 DIAGNOSIS — Z08 Encounter for follow-up examination after completed treatment for malignant neoplasm: Secondary | ICD-10-CM | POA: Diagnosis not present

## 2017-08-24 DIAGNOSIS — D2362 Other benign neoplasm of skin of left upper limb, including shoulder: Secondary | ICD-10-CM | POA: Diagnosis not present

## 2017-08-24 DIAGNOSIS — L820 Inflamed seborrheic keratosis: Secondary | ICD-10-CM | POA: Diagnosis not present

## 2017-08-24 DIAGNOSIS — L648 Other androgenic alopecia: Secondary | ICD-10-CM | POA: Diagnosis not present

## 2017-08-24 DIAGNOSIS — Z85828 Personal history of other malignant neoplasm of skin: Secondary | ICD-10-CM | POA: Diagnosis not present

## 2017-08-24 DIAGNOSIS — X32XXXA Exposure to sunlight, initial encounter: Secondary | ICD-10-CM | POA: Diagnosis not present

## 2017-10-26 ENCOUNTER — Ambulatory Visit: Payer: Medicare PPO | Admitting: Family Medicine

## 2017-10-26 ENCOUNTER — Encounter: Payer: Self-pay | Admitting: Family Medicine

## 2017-10-26 VITALS — BP 128/70 | HR 83 | Temp 98.8°F | Wt 181.0 lb

## 2017-10-26 DIAGNOSIS — J329 Chronic sinusitis, unspecified: Secondary | ICD-10-CM | POA: Diagnosis not present

## 2017-10-26 DIAGNOSIS — R05 Cough: Secondary | ICD-10-CM

## 2017-10-26 DIAGNOSIS — R059 Cough, unspecified: Secondary | ICD-10-CM

## 2017-10-26 MED ORDER — DOXYCYCLINE HYCLATE 100 MG PO TABS: 100.0000 mg | ORAL_TABLET | Freq: Two times a day (BID) | ORAL | 0 refills | Status: DC

## 2017-10-26 NOTE — Progress Notes (Signed)
Patient: Victoria Dean Female    DOB: 08-03-1950   67 y.o.   MRN: [id number] Visit Date: 10/26/2017  Today's Provider: [name], MD   Chief Complaint  Patient presents with  . URI    Started about two days ago.   Subjective:    URI   This is a new problem. The current episode started in the past 7 days. The problem has been gradually worsening. Associated symptoms include congestion, coughing, ear pain (Right ear pain.), rhinorrhea, sinus pain and wheezing. Pertinent negatives include no abdominal pain, headaches, plugged ear sensation, sneezing, sore throat or swollen glands. She has tried decongestant and antihistamine for the symptoms. The treatment provided no relief.       Allergies  Allergen Reactions  . Statins     Muscle weakness  . Sulfa Antibiotics   . Zetia [Ezetimibe]     Leg pain, upset stomach     Current Outpatient Medications:  .  Acetaminophen (TYLENOL ARTHRITIS PAIN PO), Take by mouth as needed (pain). , Disp: , Rfl:  .  Cholecalciferol (VITAMIN D3) 3000 units TABS, Take by mouth., Disp: , Rfl:  .  diazepam (VALIUM) 5 MG tablet, Take 1 tablet (5 mg total) by mouth at bedtime as needed for anxiety., Disp: 30 tablet, Rfl: 5 .  losartan (COZAAR) 50 MG tablet, Take 1 tablet (50 mg total) by mouth daily., Disp: 90 tablet, Rfl: 3 .  triamterene-hydrochlorothiazide (DYAZIDE) 37.5-25 MG capsule, Take 1 each (1 capsule total) by mouth daily., Disp: 90 capsule, Rfl: 3 .  venlafaxine (EFFEXOR) 75 MG tablet, Take 1 tablet (75 mg total) by mouth daily., Disp: 90 tablet, Rfl: 3  Review of Systems  Constitutional: Positive for fatigue. Negative for activity change, appetite change, chills, diaphoresis, fever and unexpected weight change.  HENT: Positive for congestion, ear pain (Right ear pain.), postnasal drip, rhinorrhea, sinus pressure, sinus pain and voice change. Negative for ear discharge, sneezing, sore throat, tinnitus and trouble swallowing.     Eyes: Positive for discharge and itching. Negative for photophobia, pain, redness and visual disturbance.  Respiratory: Positive for cough, shortness of breath and wheezing. Negative for apnea, choking, chest tightness and stridor.   Gastrointestinal: Negative.  Negative for abdominal pain.  Musculoskeletal: Positive for arthralgias and back pain.  Allergic/Immunologic: Positive for environmental allergies.  Neurological: Positive for light-headedness. Negative for dizziness and headaches.  Psychiatric/Behavioral: Negative.     Social History   Tobacco Use  . Smoking status: Current Every Day Smoker    Packs/day: 0.50  . Smokeless tobacco: Never Used  Substance Use Topics  . Alcohol use: Yes    Comment: Occasionally   Objective:   BP 128/70 (BP Location: Left Arm, Patient Position: Sitting, Cuff Size: Normal)   Pulse 83   Temp 98.8 F (37.1 C) (Oral)   Wt 181 lb (82.1 kg)   SpO2 95%   BMI 32.06 kg/m  Vitals:   10/26/17 1335  BP: 128/70  Pulse: 83  Temp: 98.8 F (37.1 C)  TempSrc: Oral  SpO2: 95%  Weight: 181 lb (82.1 kg)     Physical Exam  Constitutional: She is oriented to person, place, and time. She appears well-developed and well-nourished.  HENT:  Head: Normocephalic and atraumatic.  Right Ear: External ear normal.  Left Ear: External ear normal.  Nose: Nose normal.  Mouth/Throat: Oropharynx is clear and moist.  Maxillary sinuses tender.  Eyes: Conjunctivae are normal. No scleral icterus.  Neck:  No thyromegaly present.  Cardiovascular: Normal rate, regular rhythm and normal heart sounds.  Pulmonary/Chest: Effort normal and breath sounds normal.  Abdominal: Soft.  Musculoskeletal: She exhibits no edema.  Lymphadenopathy:    She has no cervical adenopathy.  Neurological: She is alert and oriented to person, place, and time.  Skin: Skin is warm and dry.  Psychiatric: She has a normal mood and affect. Her behavior is normal. Judgment and thought content  normal.        Assessment & Plan:     1. Sinusitis, unspecified chronicity, unspecified location Worsening; start Doxycycline 100mg  1BID for 10 days.   2. Cough Pt advised to quit smoking.  I have done the exam and reviewed the chart and it is accurate to the best of my knowledge. Development worker, community has been used and  any errors in dictation or transcription are unintentional. Miguel Aschoff M.D. [place], MD  [facility]

## 2017-10-27 ENCOUNTER — Ambulatory Visit: Payer: Medicare PPO | Admitting: Family Medicine

## 2017-12-29 ENCOUNTER — Other Ambulatory Visit: Payer: Self-pay | Admitting: Family Medicine

## 2017-12-29 DIAGNOSIS — I10 Essential (primary) hypertension: Secondary | ICD-10-CM

## 2018-02-16 ENCOUNTER — Ambulatory Visit (INDEPENDENT_AMBULATORY_CARE_PROVIDER_SITE_OTHER): Payer: Medicare PPO | Admitting: Family Medicine

## 2018-02-16 ENCOUNTER — Ambulatory Visit (INDEPENDENT_AMBULATORY_CARE_PROVIDER_SITE_OTHER): Payer: Medicare PPO

## 2018-02-16 VITALS — BP 120/72 | HR 99 | Temp 98.3°F | Ht 63.0 in | Wt 179.4 lb

## 2018-02-16 VITALS — BP 120/72 | HR 99 | Temp 98.3°F | Ht 63.0 in | Wt 179.0 lb

## 2018-02-16 DIAGNOSIS — J069 Acute upper respiratory infection, unspecified: Secondary | ICD-10-CM

## 2018-02-16 DIAGNOSIS — I10 Essential (primary) hypertension: Secondary | ICD-10-CM

## 2018-02-16 DIAGNOSIS — E7800 Pure hypercholesterolemia, unspecified: Secondary | ICD-10-CM

## 2018-02-16 DIAGNOSIS — F3289 Other specified depressive episodes: Secondary | ICD-10-CM | POA: Diagnosis not present

## 2018-02-16 DIAGNOSIS — Z1239 Encounter for other screening for malignant neoplasm of breast: Secondary | ICD-10-CM

## 2018-02-16 DIAGNOSIS — Z0000 Encounter for general adult medical examination without abnormal findings: Secondary | ICD-10-CM

## 2018-02-16 DIAGNOSIS — F411 Generalized anxiety disorder: Secondary | ICD-10-CM | POA: Diagnosis not present

## 2018-02-16 NOTE — Patient Instructions (Signed)
Victoria Dean , Thank you for taking time to come for your Medicare Wellness Visit. I appreciate your ongoing commitment to your health goals. Please review the following plan we discussed and let me know if I can assist you in the future.   Screening recommendations/referrals: Colonoscopy: Cologuard due 12/25/19 Mammogram: Up to date, due 12/2018 Bone Density: Up to date Recommended yearly ophthalmology/optometry visit for glaucoma screening and checkup Recommended yearly dental visit for hygiene and checkup  Vaccinations: Influenza vaccine: Up to date Pneumococcal vaccine: Completed series Tdap vaccine: Up to date, due 03/2024 Shingles vaccine: Pt declines today.     Advanced directives: Please bring a copy of your POA (Power of Attorney) and/or Living Will to your next appointment.   Conditions/risks identified: Smoking cessation; Obesity- recommend to walk 3 days a week for at least 30 minutes at a time.   Next appointment: 9:40 AM today with Dr Rosanna Randy.    Preventive Care 70 Years and Older, Female Preventive care refers to lifestyle choices and visits with your health care provider that can promote health and wellness. What does preventive care include?  A yearly physical exam. This is also called an annual well check.  Dental exams once or twice a year.  Routine eye exams. Ask your health care provider how often you should have your eyes checked.  Personal lifestyle choices, including:  Daily care of your teeth and gums.  Regular physical activity.  Eating a healthy diet.  Avoiding tobacco and drug use.  Limiting alcohol use.  Practicing safe sex.  Taking low-dose aspirin every day.  Taking vitamin and mineral supplements as recommended by your health care provider. What happens during an annual well check? The services and screenings done by your health care provider during your annual well check will depend on your age, overall health, lifestyle risk factors,  and family history of disease. Counseling  Your health care provider may ask you questions about your:  Alcohol use.  Tobacco use.  Drug use.  Emotional well-being.  Home and relationship well-being.  Sexual activity.  Eating habits.  History of falls.  Memory and ability to understand (cognition).  Work and work Statistician.  Reproductive health. Screening  You may have the following tests or measurements:  Height, weight, and BMI.  Blood pressure.  Lipid and cholesterol levels. These may be checked every 5 years, or more frequently if you are over 75 years old.  Skin check.  Lung cancer screening. You may have this screening every year starting at age 9 if you have a 30-pack-year history of smoking and currently smoke or have quit within the past 15 years.  Fecal occult blood test (FOBT) of the stool. You may have this test every year starting at age 73.  Flexible sigmoidoscopy or colonoscopy. You may have a sigmoidoscopy every 5 years or a colonoscopy every 10 years starting at age 9.  Hepatitis C blood test.  Hepatitis B blood test.  Sexually transmitted disease (STD) testing.  Diabetes screening. This is done by checking your blood sugar (glucose) after you have not eaten for a while (fasting). You may have this done every 1-3 years.  Bone density scan. This is done to screen for osteoporosis. You may have this done starting at age 59.  Mammogram. This may be done every 1-2 years. Talk to your health care provider about how often you should have regular mammograms. Talk with your health care provider about your test results, treatment options, and if necessary, the  need for more tests. Vaccines  Your health care provider may recommend certain vaccines, such as:  Influenza vaccine. This is recommended every year.  Tetanus, diphtheria, and acellular pertussis (Tdap, Td) vaccine. You may need a Td booster every 10 years.  Zoster vaccine. You may need  this after age 74.  Pneumococcal 13-valent conjugate (PCV13) vaccine. One dose is recommended after age 11.  Pneumococcal polysaccharide (PPSV23) vaccine. One dose is recommended after age 84. Talk to your health care provider about which screenings and vaccines you need and how often you need them. This information is not intended to replace advice given to you by your health care provider. Make sure you discuss any questions you have with your health care provider. Document Released: 02/16/2015 Document Revised: 10/10/2015 Document Reviewed: 11/21/2014 Elsevier Interactive Patient Education  2017 [place] Prevention in the Home Falls can cause injuries. They can happen to people of all ages. There are many things you can do to make your home safe and to help prevent falls. What can I do on the outside of my home?  Regularly fix the edges of walkways and driveways and fix any cracks.  Remove anything that might make you trip as you walk through a door, such as a raised step or threshold.  Trim any bushes or trees on the path to your home.  Use bright outdoor lighting.  Clear any walking paths of anything that might make someone trip, such as rocks or tools.  Regularly check to see if handrails are loose or broken. Make sure that both sides of any steps have handrails.  Any raised decks and porches should have guardrails on the edges.  Have any leaves, snow, or ice cleared regularly.  Use sand or salt on walking paths during winter.  Clean up any spills in your garage right away. This includes oil or grease spills. What can I do in the bathroom?  Use night lights.  Install grab bars by the toilet and in the tub and shower. Do not use towel bars as grab bars.  Use non-skid mats or decals in the tub or shower.  If you need to sit down in the shower, use a plastic, non-slip stool.  Keep the floor dry. Clean up any water that spills on the floor as soon as it  happens.  Remove soap buildup in the tub or shower regularly.  Attach bath mats securely with double-sided non-slip rug tape.  Do not have throw rugs and other things on the floor that can make you trip. What can I do in the bedroom?  Use night lights.  Make sure that you have a light by your bed that is easy to reach.  Do not use any sheets or blankets that are too big for your bed. They should not hang down onto the floor.  Have a firm chair that has side arms. You can use this for support while you get dressed.  Do not have throw rugs and other things on the floor that can make you trip. What can I do in the kitchen?  Clean up any spills right away.  Avoid walking on wet floors.  Keep items that you use a lot in easy-to-reach places.  If you need to reach something above you, use a strong step stool that has a grab bar.  Keep electrical cords out of the way.  Do not use floor polish or wax that makes floors slippery. If you must use wax,  use non-skid floor wax.  Do not have throw rugs and other things on the floor that can make you trip. What can I do with my stairs?  Do not leave any items on the stairs.  Make sure that there are handrails on both sides of the stairs and use them. Fix handrails that are broken or loose. Make sure that handrails are as long as the stairways.  Check any carpeting to make sure that it is firmly attached to the stairs. Fix any carpet that is loose or worn.  Avoid having throw rugs at the top or bottom of the stairs. If you do have throw rugs, attach them to the floor with carpet tape.  Make sure that you have a light switch at the top of the stairs and the bottom of the stairs. If you do not have them, ask someone to add them for you. What else can I do to help prevent falls?  Wear shoes that:  Do not have high heels.  Have rubber bottoms.  Are comfortable and fit you well.  Are closed at the toe. Do not wear sandals.  If you  use a stepladder:  Make sure that it is fully opened. Do not climb a closed stepladder.  Make sure that both sides of the stepladder are locked into place.  Ask someone to hold it for you, if possible.  Clearly mark and make sure that you can see:  Any grab bars or handrails.  First and last steps.  Where the edge of each step is.  Use tools that help you move around (mobility aids) if they are needed. These include:  Canes.  Walkers.  Scooters.  Crutches.  Turn on the lights when you go into a dark area. Replace any light bulbs as soon as they burn out.  Set up your furniture so you have a clear path. Avoid moving your furniture around.  If any of your floors are uneven, fix them.  If there are any pets around you, be aware of where they are.  Review your medicines with your doctor. Some medicines can make you feel dizzy. This can increase your chance of falling. Ask your doctor what other things that you can do to help prevent falls. This information is not intended to replace advice given to you by your health care provider. Make sure you discuss any questions you have with your health care provider. Document Released: 11/16/2008 Document Revised: 06/28/2015 Document Reviewed: 02/24/2014 Elsevier Interactive Patient Education  2017 [employer].

## 2018-02-16 NOTE — Progress Notes (Signed)
Subjective:   Victoria Dean is a 68 y.o. female who presents for Medicare Annual (Subsequent) preventive examination.  Review of Systems:  N/A  Cardiac Risk Factors include: advanced age (>51men, >62 women);hypertension;obesity (BMI >30kg/m2);smoking/ tobacco exposure     Objective:     Vitals: BP 120/72 (BP Location: Right Arm)   Pulse 99   Temp 98.3 F (36.8 C) (Oral)   Ht 5\' 3"  (1.6 m)   Wt 179 lb 6.4 oz (81.4 kg)   BMI 31.78 kg/m   Body mass index is 31.78 kg/m.  Advanced Directives 02/16/2018 11/12/2016 01/24/2016  Does Patient Have a Medical Advance Directive? Yes Yes Yes  Type of Paramedic of [place];Living will [place];Living will Living will;Healthcare Power of [place] in Chart? No - copy requested No - copy requested No - copy requested    Tobacco Social History   Tobacco Use  Smoking Status Current Every Day Smoker  . Packs/day: 0.50  Smokeless Tobacco Never Used     Ready to quit: Yes Counseling given: Yes   Clinical Intake:  Pre-visit preparation completed: Yes  Pain : 0-10 Pain Score: 3  Pain Type: Chronic pain Pain Location: Back Pain Orientation: Lower Pain Descriptors / Indicators: (Unable to describe pain.) Pain Frequency: Constant     Nutritional Status: BMI > 30  Obese Nutritional Risks: None Diabetes: No  How often do you need to have someone help you when you read instructions, pamphlets, or other written materials from your doctor or pharmacy?: 1 - Never  Interpreter Needed?: No  Information entered by :: [facility], LPN  Past Medical History:  Diagnosis Date  . Cataract   . Hypercholesteremia   . Hypertension   . Major depressive disorder   . 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 Vitamin D deficiency    Past Surgical History:  Procedure Laterality Date  . ABDOMINAL HYSTERECTOMY   1995  . APPENDECTOMY  1994  . BREAST BIOPSY Right 1998  . ESI    . MICRODISCECTOMY LUMBAR  2008   L4-5  . SQUAMOUS CELL CARCINOMA EXCISION    . TONSILLECTOMY  1971   Family History  Problem Relation Age of Onset  . Cancer Mother        Bone and breast cancer  . Breast cancer Mother 37  . Hypertension Brother   . COPD Father   . Hypertension Father    Social History   Socioeconomic History  . Marital status: Married    Spouse name: Not on file  . Number of children: 1  . Years of education: Not on file  . Highest education level: Bachelor's degree (e.g., BA, AB, BS)  Occupational History  . Occupation: retired  Scientific laboratory technician  . Financial resource strain: Not hard at all  . Food insecurity:    Worry: Never true    Inability: Never true  . Transportation needs:    Medical: No    Non-medical: No  Tobacco Use  . Smoking status: Current Every Day Smoker    Packs/day: 0.50  . Smokeless tobacco: Never Used  Substance and Sexual Activity  . Alcohol use: Yes    Comment: Rare- maybe once year  . Drug use: No  . Sexual activity: Not Currently  Lifestyle  . Physical activity:    Days per week: 0 days  Minutes per session: 0 min  . Stress: Only a little  Relationships  . Social connections:    Talks on phone: Patient refused    Gets together: Patient refused    Attends religious service: Patient refused    Active member of club or organization: Patient refused    Attends meetings of clubs or organizations: Patient refused    Relationship status: Patient refused  Other Topics Concern  . Not on file  Social History Narrative  . Not on file    Outpatient Encounter Medications as of 02/16/2018  Medication Sig  . Acetaminophen (TYLENOL ARTHRITIS PAIN PO) Take by mouth as needed (pain).   . Cholecalciferol (VITAMIN D3) 3000 units TABS Take by mouth daily.   . diazepam (VALIUM) 5 MG tablet Take 1 tablet (5 mg total) by mouth at bedtime as needed for anxiety.  Marland Kitchen  losartan (COZAAR) 50 MG tablet TAKE 1 TABLET DAILY.  Marland Kitchen triamterene-hydrochlorothiazide (DYAZIDE) 37.5-25 MG capsule Take 1 each (1 capsule total) by mouth daily.  Marland Kitchen venlafaxine (EFFEXOR) 75 MG tablet Take 1 tablet (75 mg total) by mouth daily.  Marland Kitchen doxycycline (VIBRA-TABS) 100 MG tablet Take 1 tablet (100 mg total) by mouth 2 (two) times daily. (Patient not taking: Reported on 02/16/2018)   No facility-administered encounter medications on file as of 02/16/2018.     Activities of Daily Living In your present state of health, do you have any difficulty performing the following activities: 02/16/2018  Hearing? N  Vision? N  Comment Wears eye glasses.  Difficulty concentrating or making decisions? N  Walking or climbing stairs? Y  Comment Due to back pain.   Dressing or bathing? N  Doing errands, shopping? N  Preparing Food and eating ? N  Using the Toilet? N  In the past six months, have you accidently leaked urine? Y  Comment Occasionally, does not wear protection.   Do you have problems with loss of bowel control? N  Managing your Medications? N  Managing your Finances? N  Housekeeping or managing your Housekeeping? N  Some recent data might be hidden    Patient Care Team: Jerrol Banana., MD as PCP - General (Family Medicine) Lorelee Cover., MD as Consulting Physician (Ophthalmology) Dimmig, Marcello Moores, MD as Referring Physician (Orthopedic Surgery) Oneta Rack, MD (Dermatology) Dasher, Rayvon Char, MD (Dermatology)    Assessment:   This is a routine wellness examination for [name].  Exercise Activities and Dietary recommendations Current Exercise Habits: The patient does not participate in regular exercise at present, Exercise limited by: None identified  Goals    . Exercise 3x per week (30 min per time)     Recommend to walk 3 days a week for at least 30 minutes at a time.     . Increase water intake     Starting 01/24/16, I will continue drinking 4-6 glasses of  water a day.    . Prevent Falls     Recommend to remove tripping hazards from home or request assistance with taking the dog outside       Fall Risk Fall Risk  02/16/2018 11/12/2016 01/24/2016 11/22/2014  Falls in the past year? 0 Yes No No  Number falls in past yr: - 2 or more - -  Injury with Fall? - No - -  Risk for fall due to : - Impaired vision - -  Risk for fall due to: Comment - wears eye glasses - -  Follow up - Education provided;Falls prevention  discussed - -   FALL RISK PREVENTION PERTAINING TO THE HOME:  Any stairs in or around the home WITH handrails? No  Home free of loose throw rugs in walkways, pet beds, electrical cords, etc? Yes  Adequate lighting in your home to reduce risk of falls? Yes   ASSISTIVE DEVICES UTILIZED TO PREVENT FALLS:  Life alert? No  Use of a cane, walker or w/c? Yes  Grab bars in the bathroom? Yes  Shower chair or bench in shower? No  Elevated toilet seat or a handicapped toilet? Yes    TIMED UP AND GO:  Was the test performed? No .    Depression Screen PHQ 2/9 Scores 02/16/2018 08/10/2017 11/12/2016 07/23/2016  PHQ - 2 Score 0 0 2 1  PHQ- 9 Score - 1 3 2      Cognitive Function: Declined today.      6CIT Screen 01/24/2016  What Year? 0 points  What month? 0 points  What time? 0 points  Count back from 20 0 points  Months in reverse 0 points  Repeat phrase 0 points  Total Score 0    Immunization History  Administered Date(s) Administered  . Hepatitis B 06/12/1995, 07/15/1995, 12/15/1995  . Influenza Split 01/01/2011, 11/24/2012  . Influenza, High Dose Seasonal PF 11/22/2014, 10/23/2015, 11/12/2016  . Influenza,inj,Quad PF,6+ Mos 11/13/2013  . Pneumococcal Conjugate-13 10/11/2014  . Pneumococcal Polysaccharide-23 11/08/2008, 01/24/2016  . Pneumococcal-Unspecified 01/04/2015  . Td 06/21/2004  . Tdap 03/23/2014  . Zoster 11/13/2013    Qualifies for Shingles Vaccine? Yes  Zostavax completed 11/13/13. Due for Shingrix.  Education has been provided regarding the importance of this vaccine. Pt has been advised to call insurance company to determine out of pocket expense. Advised may also receive vaccine at local pharmacy or Health Dept. Verbalized acceptance and understanding.  Tdap: Up to date  Flu Vaccine: Up to date  Pneumococcal Vaccine: Up to date   Screening Tests Health Maintenance  Topic Date Due  . MAMMOGRAM  12/12/2018  . Fecal DNA (Cologuard)  12/25/2019  . TETANUS/TDAP  03/23/2024  . DEXA SCAN  04/25/2024  . INFLUENZA VACCINE  Completed  . Hepatitis C Screening  Completed  . PNA vac Low Risk Adult  Completed    Cancer Screenings:  Colorectal Screening: Cologuard completed 12/24/16. Repeat every 3 years.  Mammogram: Completed 12/11/16.   Bone Density: Completed 04/26/14. Results reflect NORMAL.  Lung Cancer Screening: (Low Dose CT Chest recommended if Age 6-80 years, 30 pack-year currently smoking OR have quit w/in 15years.) does qualify. An Epic message has been sent to Burgess Estelle, RN (Oncology Nurse Navigator) regarding the possible need for this exam. Raquel Sarna will review the patient's chart to determine if the patient truly qualifies for the exam. If the patient qualifies, Raquel Sarna will order the Low Dose CT of the chest to facilitate the scheduling of this exam.   Additional Screening:  Hepatitis C Screening: Up to date  Vision Screening: Recommended annual ophthalmology exams for early detection of glaucoma and other disorders of the eye.  Dental Screening: Recommended annual dental exams for proper oral hygiene  Community Resource Referral:  CRR required this visit?  No       Plan:  I have personally reviewed and addressed the Medicare Annual Wellness questionnaire and have noted the following in the patient's chart:  A. Medical and social history B. Use of alcohol, tobacco or illicit drugs  C. Current medications and supplements D. Functional ability and status E.  Nutritional status F.  Physical activity G. Advance directives H. List of other physicians I.  Hospitalizations, surgeries, and ER visits in previous 12 months J.  [place] such as hearing and vision if needed, cognitive and depression L. Referrals and appointments - none  In addition, I have reviewed and discussed with patient certain preventive protocols, quality metrics, and best practice recommendations. A written personalized care plan for preventive services as well as general preventive health recommendations were provided to patient.  See attached scanned questionnaire for additional information.   Signed,  Fabio Neighbors, LPN Nurse Health Advisor   Nurse Recommendations: None.

## 2018-02-16 NOTE — Progress Notes (Signed)
Patient: Victoria Dean, Female    DOB: 1950/06/14, 68 y.o.   MRN: [id number] Visit Date: 02/16/2018  Today's Provider: [name], MD   No chief complaint on file.  Subjective:    Annual Physical visit Victoria Dean is a 68 y.o. female. She feels well.   Colonoscopy- 12/24/2016. Repeat 12/2019.  Mammogram- 12/11/2016. Normal. Tdap- 03/23/2014.  BMD- 04/26/2014. Osteoporosis. Repeat in 2 years.   Pap- S/P hysterectomy. 1995.   Patient mentions that her hairline has receded. She has noticed an difference in the last few months.  She also c/o joint pain in her left pointer finger.     Review of Systems  Constitutional: Negative.   HENT: Positive for congestion, postnasal drip, rhinorrhea, sinus pressure, sneezing, sore throat and tinnitus.   Eyes: Positive for itching.  Respiratory: Positive for cough, shortness of breath and wheezing.   Cardiovascular: Positive for chest pain.  Endocrine: Negative.   Genitourinary: Negative.   Musculoskeletal: Positive for arthralgias, back pain, joint swelling and myalgias.  Skin: Negative for color change, pallor, rash and wound.  Allergic/Immunologic: Negative.   Neurological: Positive for weakness and light-headedness.  Hematological: Negative.   Psychiatric/Behavioral: The patient is nervous/anxious.     Social History   Socioeconomic History  . Marital status: Married    Spouse name: Not on file  . Number of children: 1  . Years of education: Not on file  . Highest education level: Bachelor's degree (e.g., BA, AB, BS)  Occupational History  . Occupation: retired  Scientific laboratory technician  . Financial resource strain: Not hard at all  . Food insecurity:    Worry: Never true    Inability: Never true  . Transportation needs:    Medical: No    Non-medical: No  Tobacco Use  . Smoking status: Current Every Day Smoker    Packs/day: 0.50  . Smokeless tobacco: Never Used  Substance and Sexual Activity  . Alcohol use: Yes   Comment: Rare- maybe once year  . Drug use: No  . Sexual activity: Not Currently  Lifestyle  . Physical activity:    Days per week: 0 days    Minutes per session: 0 min  . Stress: Only a little  Relationships  . Social connections:    Talks on phone: Patient refused    Gets together: Patient refused    Attends religious service: Patient refused    Active member of club or organization: Patient refused    Attends meetings of clubs or organizations: Patient refused    Relationship status: Patient refused  . Intimate partner violence:    Fear of current or ex partner: Patient refused    Emotionally abused: Patient refused    Physically abused: Patient refused    Forced sexual activity: Patient refused  Other Topics Concern  . Not on file  Social History Narrative  . Not on file    Past Medical History:  Diagnosis Date  . Cataract   . Hypercholesteremia   . Hypertension   . Major depressive disorder   . 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 Vitamin D deficiency      Patient Active Problem List   Diagnosis Date Noted  . History of lumbar laminectomy 04/30/2016  . Actinic keratosis 11/21/2014  . Adaptation reaction 11/21/2014  . Allergic rhinitis 11/21/2014  . Cornu cutaneum 11/21/2014  . Clinical depression 11/21/2014  .  Dermatitis, eczematoid 11/21/2014  . Enthesopathy 11/21/2014  . Essential (primary) hypertension 11/21/2014  . Anxiety, generalized 11/21/2014  . Herniated nucleus pulposus 11/21/2014  . Mild episode of recurrent major depressive disorder ([place]) 11/21/2014  . Adiposity 11/21/2014  . Arthritis, degenerative 11/21/2014  . Basal cell papilloma 11/21/2014  . Apnea, sleep 11/21/2014  . Avitaminosis D 11/21/2014    Past Surgical History:  Procedure Laterality Date  . ABDOMINAL HYSTERECTOMY  1995  . APPENDECTOMY  1994  . BREAST BIOPSY Right 1998  . ESI    . MICRODISCECTOMY LUMBAR  2008   L4-5  .  SQUAMOUS CELL CARCINOMA EXCISION    . TONSILLECTOMY  1971    Her family history includes Breast cancer (age of onset: 35) in her mother; COPD in her father; Cancer in her mother; Hypertension in her brother and father.      Current Outpatient Medications:  .  Acetaminophen (TYLENOL ARTHRITIS PAIN PO), Take by mouth as needed (pain). , Disp: , Rfl:  .  Cholecalciferol (VITAMIN D3) 3000 units TABS, Take by mouth daily. , Disp: , Rfl:  .  diazepam (VALIUM) 5 MG tablet, Take 1 tablet (5 mg total) by mouth at bedtime as needed for anxiety., Disp: 30 tablet, Rfl: 5 .  doxycycline (VIBRA-TABS) 100 MG tablet, Take 1 tablet (100 mg total) by mouth 2 (two) times daily. (Patient not taking: Reported on 02/16/2018), Disp: 20 tablet, Rfl: 0 .  losartan (COZAAR) 50 MG tablet, TAKE 1 TABLET DAILY., Disp: 90 tablet, Rfl: 3 .  triamterene-hydrochlorothiazide (DYAZIDE) 37.5-25 MG capsule, Take 1 each (1 capsule total) by mouth daily., Disp: 90 capsule, Rfl: 3 .  venlafaxine (EFFEXOR) 75 MG tablet, Take 1 tablet (75 mg total) by mouth daily., Disp: 90 tablet, Rfl: 3  Patient Care Team: Jerrol Banana., MD as PCP - General (Family Medicine) Lorelee Cover., MD as Consulting Physician (Ophthalmology) Dimmig, Marcello Moores, MD as Referring Physician (Orthopedic Surgery) Oneta Rack, MD (Dermatology) Dasher, Rayvon Char, MD (Dermatology)     Objective:   Vitals: BP 120/72   Pulse 99   Temp 98.3 F (36.8 C)   Ht 5\' 3"  (1.6 m)   Wt 179 lb (81.2 kg)   BMI 31.71 kg/m   Physical Exam Exam conducted with a chaperone present.  Constitutional:      Appearance: Normal appearance. She is well-developed.  HENT:     Head: Normocephalic and atraumatic.     Right Ear: External ear normal.     Left Ear: External ear normal.     Mouth/Throat:     Pharynx: Oropharynx is clear.  Eyes:     General: No scleral icterus.    Conjunctiva/sclera: Conjunctivae normal.     Pupils: Pupils are equal, round, and  reactive to light.  Neck:     Musculoskeletal: Normal range of motion and neck supple.  Cardiovascular:     Rate and Rhythm: Normal rate and regular rhythm.     Heart sounds: Normal heart sounds.  Pulmonary:     Effort: Pulmonary effort is normal.     Breath sounds: Normal breath sounds.  Chest:     Breasts:        Right: No mass.        Left: No mass.  Abdominal:     Palpations: Abdomen is soft.  Musculoskeletal: Normal range of motion.  Skin:    General: Skin is warm and dry.     Comments: Very fair skin.  Neurological:  Mental Status: She is alert and oriented to person, place, and time.     Deep Tendon Reflexes: Reflexes are normal and symmetric.  Psychiatric:        Behavior: Behavior normal.        Thought Content: Thought content normal.        Judgment: Judgment normal.     Activities of Daily Living In your present state of health, do you have any difficulty performing the following activities: 02/16/2018  Hearing? N  Vision? N  Comment Wears eye glasses.  Difficulty concentrating or making decisions? N  Walking or climbing stairs? Y  Comment Due to back pain.   Dressing or bathing? N  Doing errands, shopping? N  Preparing Food and eating ? N  Using the Toilet? N  In the past six months, have you accidently leaked urine? Y  Comment Occasionally, does not wear protection.   Do you have problems with loss of bowel control? N  Managing your Medications? N  Managing your Finances? N  Housekeeping or managing your Housekeeping? N  Some recent data might be hidden    Fall Risk Assessment Fall Risk  02/16/2018 11/12/2016 01/24/2016 11/22/2014  Falls in the past year? 0 Yes No No  Number falls in past yr: - 2 or more - -  Injury with Fall? - No - -  Risk for fall due to : - Impaired vision - -  Risk for fall due to: Comment - wears eye glasses - -  Follow up - Education provided;Falls prevention discussed - -     Depression Screen PHQ 2/9 Scores  02/16/2018 08/10/2017 11/12/2016 07/23/2016  PHQ - 2 Score 0 0 2 1  PHQ- 9 Score - 1 3 2     6CIT Screen 01/24/2016  What Year? 0 points  What month? 0 points  What time? 0 points  Count back from 20 0 points  Months in reverse 0 points  Repeat phrase 0 points  Total Score 0     Assessment & Plan:     Annual Physical Visit  Reviewed patient's Family Medical History Reviewed and updated list of patient's medical providers Assessment of cognitive impairment was done Assessed patient's functional ability Established a written schedule for health screening [place] Completed and Reviewed  Exercise Activities and Dietary recommendations Goals    . Exercise 3x per week (30 min per time)     Recommend to walk 3 days a week for at least 30 minutes at a time.     . Increase water intake     Starting 01/24/16, I will continue drinking 4-6 glasses of water a day.    . Prevent Falls     Recommend to remove tripping hazards from home or request assistance with taking the dog outside       Immunization History  Administered Date(s) Administered  . Hepatitis B 06/12/1995, 07/15/1995, 12/15/1995  . Influenza Split 01/01/2011, 11/24/2012  . Influenza, High Dose Seasonal PF 11/22/2014, 10/23/2015, 11/12/2016  . Influenza,inj,Quad PF,6+ Mos 11/13/2013  . Pneumococcal Conjugate-13 10/11/2014  . Pneumococcal Polysaccharide-23 11/08/2008, 01/24/2016  . Pneumococcal-Unspecified 01/04/2015  . Td 06/21/2004  . Tdap 03/23/2014  . Zoster 11/13/2013    Health Maintenance  Topic Date Due  . MAMMOGRAM  12/12/2018  . Fecal DNA (Cologuard)  12/25/2019  . TETANUS/TDAP  03/23/2024  . DEXA SCAN  04/25/2024  . INFLUENZA VACCINE  Completed  . Hepatitis C Screening  Completed  . PNA vac Low Risk  Adult  Completed     Discussed health benefits of physical activity, and encouraged her to engage in regular exercise appropriate for her age and condition.  1. Encounter for  annual physical exam Overall patient is doing well.  I have encouraged her to completely quit smoking.   2. Essential (primary) hypertension Controlled - Comprehensive metabolic panel  3. Pure hypercholesterolemia  - Lipid panel  4. Anxiety, generalized Improve - CBC with Differential/Platelet  5. Other depression Improved.  On next visit may consider weaning off of antidepressants although I think Effexor is probably helping her chronic back pain also. - TSH  6. Screening for breast cancer  - MM Digital Screening; Future 7.URI  I have done the exam and reviewed the above chart and it is accurate to the best of my knowledge. Development worker, community has been used in this note in any air is in the dictation or transcription are unintentional.  [name], MD  [place]

## 2018-02-17 ENCOUNTER — Telehealth: Payer: Self-pay | Admitting: *Deleted

## 2018-02-17 LAB — COMPREHENSIVE METABOLIC PANEL
A/G RATIO: 1.9 (ref 1.2–2.2)
ALBUMIN: 4.4 g/dL (ref 3.6–4.8)
ALK PHOS: 115 IU/L (ref 39–117)
ALT: 13 IU/L (ref 0–32)
AST: 14 IU/L (ref 0–40)
BUN / CREAT RATIO: 18 (ref 12–28)
BUN: 17 mg/dL (ref 8–27)
Bilirubin Total: 0.3 mg/dL (ref 0.0–1.2)
CO2: 27 mmol/L (ref 20–29)
CREATININE: 0.92 mg/dL (ref 0.57–1.00)
Calcium: 9.9 mg/dL (ref 8.7–10.3)
Chloride: 99 mmol/L (ref 96–106)
GFR calc Af Amer: 73 mL/min/{1.73_m2} (ref >59)
GFR, EST NON AFRICAN AMERICAN: 64 mL/min/{1.73_m2} (ref >59)
GLOBULIN, TOTAL: 2.3 g/dL (ref 1.5–4.5)
Glucose: 88 mg/dL (ref 65–99)
POTASSIUM: 4.4 mmol/L (ref 3.5–5.2)
SODIUM: 141 mmol/L (ref 134–144)
Total Protein: 6.7 g/dL (ref 6.0–8.5)

## 2018-02-17 LAB — CBC WITH DIFFERENTIAL/PLATELET
BASOS: 1 %
Basophils Absolute: 0.1 10*3/uL (ref 0.0–0.2)
EOS (ABSOLUTE): 0.1 10*3/uL (ref 0.0–0.4)
EOS: 1 %
HEMATOCRIT: 39.6 % (ref 34.0–46.6)
HEMOGLOBIN: 12.8 g/dL (ref 11.1–15.9)
Immature Grans (Abs): 0 10*3/uL (ref 0.0–0.1)
Immature Granulocytes: 0 %
LYMPHS ABS: 2.3 10*3/uL (ref 0.7–3.1)
Lymphs: 32 %
MCH: 29.6 pg (ref 26.6–33.0)
MCHC: 32.3 g/dL (ref 31.5–35.7)
MCV: 92 fL (ref 79–97)
MONOS ABS: 0.6 10*3/uL (ref 0.1–0.9)
Monocytes: 8 %
NEUTROS ABS: 4.2 10*3/uL (ref 1.4–7.0)
Neutrophils: 58 %
Platelets: 278 10*3/uL (ref 150–450)
RBC: 4.32 x10E6/uL (ref 3.77–5.28)
RDW: 13.2 % (ref 11.7–15.4)
WBC: 7.3 10*3/uL (ref 3.4–10.8)

## 2018-02-17 LAB — LIPID PANEL
CHOL/HDL RATIO: 3.6 ratio (ref 0.0–4.4)
CHOLESTEROL TOTAL: 222 mg/dL — AB (ref 100–199)
HDL: 62 mg/dL (ref >39)
LDL CALC: 138 mg/dL — AB (ref 0–99)
TRIGLYCERIDES: 112 mg/dL (ref 0–149)
VLDL Cholesterol Cal: 22 mg/dL (ref 5–40)

## 2018-02-17 LAB — TSH: TSH: 0.693 u[IU]/mL (ref 0.450–4.500)

## 2018-02-17 NOTE — Telephone Encounter (Signed)
Received referral for low dose lung cancer screening CT scan. Message left at phone number listed in EMR for patient to call me back to facilitate scheduling scan.  

## 2018-02-18 ENCOUNTER — Telehealth: Payer: Self-pay | Admitting: *Deleted

## 2018-02-18 DIAGNOSIS — Z87891 Personal history of nicotine dependence: Secondary | ICD-10-CM

## 2018-02-18 DIAGNOSIS — Z122 Encounter for screening for malignant neoplasm of respiratory organs: Secondary | ICD-10-CM

## 2018-02-18 NOTE — Telephone Encounter (Signed)
Received referral for initial lung cancer screening scan. Contacted patient and obtained smoking history,(current, 30 pack year) as well as answering questions related to screening process. Patient denies signs of lung cancer such as weight loss or hemoptysis. Patient denies comorbidity that would prevent curative treatment if lung cancer were found. Patient is scheduled for shared decision making visit and CT scan on 02/23/18 at 145pm.

## 2018-02-19 ENCOUNTER — Telehealth: Payer: Self-pay

## 2018-02-19 NOTE — Telephone Encounter (Signed)
Patient advised.KW 

## 2018-02-19 NOTE — Telephone Encounter (Signed)
[id number] 

## 2018-02-19 NOTE — Telephone Encounter (Signed)
-----   Message from Jerrol Banana., MD sent at 02/18/2018  8:51 AM EST ----- Labs good.

## 2018-02-22 ENCOUNTER — Telehealth: Payer: Self-pay | Admitting: *Deleted

## 2018-02-22 NOTE — Telephone Encounter (Signed)
Called pt to remind her of her appt for ldct screening on 02-23-2018 @1345 , voiced understanding.

## 2018-02-23 ENCOUNTER — Encounter: Payer: Self-pay | Admitting: Nurse Practitioner

## 2018-02-23 ENCOUNTER — Inpatient Hospital Stay: Payer: Medicare PPO | Attending: Oncology | Admitting: Nurse Practitioner

## 2018-02-23 ENCOUNTER — Ambulatory Visit
Admission: RE | Admit: 2018-02-23 | Discharge: 2018-02-23 | Disposition: A | Discharge status: Home / Self Care | Payer: Medicare PPO | Source: Ambulatory Visit | Attending: Oncology | Admitting: Oncology

## 2018-02-23 DIAGNOSIS — Z87891 Personal history of nicotine dependence: Secondary | ICD-10-CM | POA: Insufficient documentation

## 2018-02-23 DIAGNOSIS — Z720 Tobacco use: Secondary | ICD-10-CM

## 2018-02-23 DIAGNOSIS — F17210 Nicotine dependence, cigarettes, uncomplicated: Secondary | ICD-10-CM | POA: Diagnosis not present

## 2018-02-23 DIAGNOSIS — Z122 Encounter for screening for malignant neoplasm of respiratory organs: Secondary | ICD-10-CM | POA: Diagnosis not present

## 2018-02-23 NOTE — Progress Notes (Signed)
In accordance with CMS guidelines, patient has met eligibility criteria including age, absence of signs or symptoms of lung cancer.  Social History   Tobacco Use  . Smoking status: Current Every Day Smoker    Packs/day: 0.91    Years: 33.00    Pack years: 30.03  . Smokeless tobacco: Never Used  Substance Use Topics  . Alcohol use: Yes    Comment: Rare- maybe once year  . Drug use: No      A shared decision-making session was conducted prior to the performance of CT scan. This includes one or more decision aids, includes benefits and harms of screening, follow-up diagnostic testing, over-diagnosis, false positive rate, and total radiation exposure.   Counseling on the importance of adherence to annual lung cancer LDCT screening, impact of co-morbidities, and ability or willingness to undergo diagnosis and treatment is imperative for compliance of the program.   Counseling on the importance of continued smoking cessation for former smokers; the importance of smoking cessation for current smokers, and information about tobacco cessation interventions have been given to patient including [place] and [facility] quit [place] programs.   Written order for lung cancer screening with LDCT has been given to the patient and any and all questions have been answered to the best of my abilities.    Yearly follow up will be coordinated by Burgess Estelle, Thoracic Navigator.  Beckey Rutter, DNP, AGNP-C [place] [facility] [phone] (work cell) [phone] (office) 02/23/18 2:51 PM

## 2018-02-24 ENCOUNTER — Encounter: Payer: Self-pay | Admitting: *Deleted

## 2018-04-16 ENCOUNTER — Other Ambulatory Visit: Payer: Self-pay

## 2018-04-16 ENCOUNTER — Ambulatory Visit
Admission: RE | Admit: 2018-04-16 | Discharge: 2018-04-16 | Disposition: A | Discharge status: Home / Self Care | Payer: Medicare PPO | Source: Ambulatory Visit | Attending: Family Medicine | Admitting: Family Medicine

## 2018-04-16 DIAGNOSIS — Z1231 Encounter for screening mammogram for malignant neoplasm of breast: Secondary | ICD-10-CM | POA: Diagnosis not present

## 2018-04-16 DIAGNOSIS — Z1239 Encounter for other screening for malignant neoplasm of breast: Secondary | ICD-10-CM

## 2018-04-19 ENCOUNTER — Other Ambulatory Visit: Payer: Self-pay | Admitting: Family Medicine

## 2018-04-19 DIAGNOSIS — I10 Essential (primary) hypertension: Secondary | ICD-10-CM

## 2018-07-07 ENCOUNTER — Other Ambulatory Visit: Payer: Self-pay | Admitting: Family Medicine

## 2018-07-07 DIAGNOSIS — F3289 Other specified depressive episodes: Secondary | ICD-10-CM

## 2018-07-07 DIAGNOSIS — F411 Generalized anxiety disorder: Secondary | ICD-10-CM

## 2018-08-18 ENCOUNTER — Ambulatory Visit (INDEPENDENT_AMBULATORY_CARE_PROVIDER_SITE_OTHER): Payer: Medicare PPO | Admitting: Family Medicine

## 2018-08-18 ENCOUNTER — Other Ambulatory Visit: Payer: Self-pay

## 2018-08-18 ENCOUNTER — Encounter: Payer: Self-pay | Admitting: Family Medicine

## 2018-08-18 VITALS — BP 120/78 | Temp 98.7°F | Resp 16 | Ht 63.0 in | Wt 203.0 lb

## 2018-08-18 DIAGNOSIS — F411 Generalized anxiety disorder: Secondary | ICD-10-CM | POA: Diagnosis not present

## 2018-08-18 DIAGNOSIS — I2510 Atherosclerotic heart disease of native coronary artery without angina pectoris: Secondary | ICD-10-CM

## 2018-08-18 DIAGNOSIS — Z87891 Personal history of nicotine dependence: Secondary | ICD-10-CM

## 2018-08-18 DIAGNOSIS — I10 Essential (primary) hypertension: Secondary | ICD-10-CM | POA: Diagnosis not present

## 2018-08-18 DIAGNOSIS — F3289 Other specified depressive episodes: Secondary | ICD-10-CM

## 2018-08-18 MED ORDER — ROSUVASTATIN CALCIUM 5 MG PO TABS: 5.0000 mg | ORAL_TABLET | Freq: Every day | ORAL | 3 refills | Status: DC

## 2018-08-18 MED ORDER — VENLAFAXINE HCL ER 150 MG PO CP24: 150.0000 mg | ORAL_CAPSULE | Freq: Every day | ORAL | 3 refills | Status: DC

## 2018-08-18 NOTE — Patient Instructions (Signed)
Also start Co Q10 over the counter once daily.

## 2018-08-18 NOTE — Progress Notes (Addendum)
Patient: Victoria Dean Female    DOB: Nov 10, 1950   68 y.o.   MRN: [id number] Visit Date: 08/18/2018  Today's Provider: [name], MD   Chief Complaint  Patient presents with  . Hypertension  . Foot Pain   Subjective:   HPI    Hypertension, follow-up:  BP Readings from Last 3 Encounters:  08/18/18 120/78  02/16/18 120/72  02/16/18 120/72    She was last seen for hypertension 6 months ago.  BP at that visit was 120/72. Management since that visit includes no changes. She reports good compliance with treatment. She is not having side effects.  She is not exercising. She is adherent to low salt diet.   Outside blood pressures are checked occasionally. She is experiencing none.  Patient denies exertional chest pressure/discomfort, lower extremity edema and palpitations.   Cardiovascular risk factors include smoking/ tobacco exposure.   Weight trend: stable Wt Readings from Last 3 Encounters:  08/18/18 203 lb (92.1 kg)  02/23/18 179 lb (81.2 kg)  02/16/18 179 lb (81.2 kg)    Current diet: well balanced   Heel Pain: Patient reports that she is having pain in her right heel that has worsened for over 1 month. She has tried Ibuprofen with no relief.   Allergies  Allergen Reactions  . Statins     Muscle weakness  . Sulfa Antibiotics   . Zetia [Ezetimibe]     Leg pain, upset stomach     Current Outpatient Medications:  .  Acetaminophen (TYLENOL ARTHRITIS PAIN PO), Take by mouth as needed (pain). , Disp: , Rfl:  .  Cholecalciferol (VITAMIN D3) 3000 units TABS, Take by mouth daily. , Disp: , Rfl:  .  diazepam (VALIUM) 5 MG tablet, Take 1 tablet (5 mg total) by mouth at bedtime as needed for anxiety., Disp: 30 tablet, Rfl: 5 .  losartan (COZAAR) 50 MG tablet, TAKE 1 TABLET DAILY., Disp: 90 tablet, Rfl: 3 .  triamterene-hydrochlorothiazide (DYAZIDE) 37.5-25 MG capsule, TAKE 1 CAPSULE EVERY DAY, Disp: 90 capsule, Rfl: 3 .  venlafaxine (EFFEXOR) 75 MG  tablet, TAKE 1 TABLET (75 MG TOTAL) BY MOUTH DAILY., Disp: 90 tablet, Rfl: 3 .  doxycycline (VIBRA-TABS) 100 MG tablet, Take 1 tablet (100 mg total) by mouth 2 (two) times daily. (Patient not taking: Reported on 02/16/2018), Disp: 20 tablet, Rfl: 0  Review of Systems  Constitutional: Negative for activity change, fatigue and fever.  Respiratory: Negative for cough and shortness of breath.   Cardiovascular: Negative for chest pain, palpitations and leg swelling.  Gastrointestinal: Negative.   Endocrine: Negative.   Musculoskeletal: Negative for arthralgias, gait problem and joint swelling.  Skin: Negative for color change, pallor, rash and wound.  Allergic/Immunologic: Negative for environmental allergies.  Neurological: Negative for dizziness and light-headedness.  Hematological: Negative.   Psychiatric/Behavioral: Negative.     Social History   Tobacco Use  . Smoking status: Current Every Day Smoker    Packs/day: 0.91    Years: 33.00    Pack years: 30.03  . Smokeless tobacco: Never Used  Substance Use Topics  . Alcohol use: Yes    Comment: Rare- maybe once year      Objective:   BP 120/78   Temp 98.7 F (37.1 C)   Resp 16   Ht 5\' 3"  (1.6 m)   Wt 203 lb (92.1 kg)   SpO2 99%   BMI 35.96 kg/m  Vitals:   08/18/18 0910  BP: 120/78  Resp: 16  Temp: 98.7 F (37.1 C)  SpO2: 99%  Weight: 203 lb (92.1 kg)  Height: 5\' 3"  (1.6 m)     Physical Exam Vitals signs reviewed.  Constitutional:      Appearance: She is well-developed.  HENT:     Head: Normocephalic and atraumatic.  Eyes:     Conjunctiva/sclera: Conjunctivae normal.     Pupils: Pupils are equal, round, and reactive to light.  Neck:     Musculoskeletal: Normal range of motion and neck supple.  Cardiovascular:     Rate and Rhythm: Normal rate and regular rhythm.     Heart sounds: Normal heart sounds.  Pulmonary:     Effort: Pulmonary effort is normal.     Breath sounds: Normal breath sounds.  Abdominal:      Palpations: Abdomen is soft.  Musculoskeletal: Normal range of motion.  Skin:    General: Skin is warm and dry.     Comments: Very fair skin.  Neurological:     Mental Status: She is alert and oriented to person, place, and time.     Deep Tendon Reflexes: Reflexes are normal and symmetric.  Psychiatric:        Behavior: Behavior normal.        Thought Content: Thought content normal.        Judgment: Judgment normal.   CT of chest reveals CAD  ECG reveals NSR with no STTWCs. No results found for any visits on 08/18/18.     Assessment & Plan    1. Essential (primary) hypertension Controlled with Losartan  2. ASCVD (arteriosclerotic cardiovascular disease Asymptomatic. Baseline ECG today. Offered cardiology referral but presently will treat all risk factors.Stop smoking and start crestor. Lipids on next visit. - EKG 12-Lead - rosuvastatin (CRESTOR) 5 MG tablet; Take 1 tablet (5 mg total) by mouth daily.  Dispense: 90 tablet; Refill: 3.also take Co-Q 10 daily.  3. Other depression Double Effexor dose--RTC 1-2 months - venlafaxine XR (EFFEXOR-XR) 150 MG 24 hr capsule; Take 1 capsule (150 mg total) by mouth daily with breakfast.  Dispense: 90 capsule; Refill: 3  4. Anxiety, generalized   5. Personal history of tobacco use, presenting hazards to health Pt knows to stop.She quit 4 months ago.     Sahar Ryback Cranford Mon, MD  [facility]

## 2018-09-27 ENCOUNTER — Ambulatory Visit (INDEPENDENT_AMBULATORY_CARE_PROVIDER_SITE_OTHER): Payer: Medicare PPO | Admitting: Family Medicine

## 2018-09-27 DIAGNOSIS — Z23 Encounter for immunization: Secondary | ICD-10-CM | POA: Diagnosis not present

## 2018-09-28 ENCOUNTER — Ambulatory Visit: Payer: Self-pay | Admitting: Family Medicine

## 2018-09-28 ENCOUNTER — Ambulatory Visit: Payer: Medicare PPO | Admitting: Family Medicine

## 2018-10-05 DIAGNOSIS — M722 Plantar fascial fibromatosis: Secondary | ICD-10-CM | POA: Diagnosis not present

## 2018-10-05 DIAGNOSIS — M7751 Other enthesopathy of right foot: Secondary | ICD-10-CM | POA: Diagnosis not present

## 2018-10-07 DIAGNOSIS — D2272 Melanocytic nevi of left lower limb, including hip: Secondary | ICD-10-CM | POA: Diagnosis not present

## 2018-10-07 DIAGNOSIS — D225 Melanocytic nevi of trunk: Secondary | ICD-10-CM | POA: Diagnosis not present

## 2018-10-07 DIAGNOSIS — X32XXXA Exposure to sunlight, initial encounter: Secondary | ICD-10-CM | POA: Diagnosis not present

## 2018-10-07 DIAGNOSIS — Z85828 Personal history of other malignant neoplasm of skin: Secondary | ICD-10-CM | POA: Diagnosis not present

## 2018-10-07 DIAGNOSIS — D2271 Melanocytic nevi of right lower limb, including hip: Secondary | ICD-10-CM | POA: Diagnosis not present

## 2018-10-07 DIAGNOSIS — L570 Actinic keratosis: Secondary | ICD-10-CM | POA: Diagnosis not present

## 2018-10-07 DIAGNOSIS — Z08 Encounter for follow-up examination after completed treatment for malignant neoplasm: Secondary | ICD-10-CM | POA: Diagnosis not present

## 2018-10-07 DIAGNOSIS — D2261 Melanocytic nevi of right upper limb, including shoulder: Secondary | ICD-10-CM | POA: Diagnosis not present

## 2018-10-07 DIAGNOSIS — D2262 Melanocytic nevi of left upper limb, including shoulder: Secondary | ICD-10-CM | POA: Diagnosis not present

## 2018-10-27 ENCOUNTER — Ambulatory Visit (INDEPENDENT_AMBULATORY_CARE_PROVIDER_SITE_OTHER): Payer: Medicare PPO | Admitting: Family Medicine

## 2018-10-27 ENCOUNTER — Encounter: Payer: Self-pay | Admitting: Family Medicine

## 2018-10-27 ENCOUNTER — Other Ambulatory Visit: Payer: Self-pay

## 2018-10-27 VITALS — BP 124/68 | HR 57 | Temp 96.7°F | Resp 18 | Wt 215.0 lb

## 2018-10-27 DIAGNOSIS — Z6838 Body mass index (BMI) 38.0-38.9, adult: Secondary | ICD-10-CM | POA: Diagnosis not present

## 2018-10-27 DIAGNOSIS — F411 Generalized anxiety disorder: Secondary | ICD-10-CM | POA: Diagnosis not present

## 2018-10-27 DIAGNOSIS — R32 Unspecified urinary incontinence: Secondary | ICD-10-CM | POA: Diagnosis not present

## 2018-10-27 DIAGNOSIS — F330 Major depressive disorder, recurrent, mild: Secondary | ICD-10-CM | POA: Diagnosis not present

## 2018-10-27 DIAGNOSIS — I10 Essential (primary) hypertension: Secondary | ICD-10-CM

## 2018-10-27 DIAGNOSIS — G4733 Obstructive sleep apnea (adult) (pediatric): Secondary | ICD-10-CM

## 2018-10-27 DIAGNOSIS — F3289 Other specified depressive episodes: Secondary | ICD-10-CM

## 2018-10-27 LAB — POCT URINALYSIS DIPSTICK
Bilirubin, UA: NEGATIVE
Glucose, UA: NEGATIVE
Ketones, UA: NEGATIVE
Nitrite, UA: NEGATIVE
Protein, UA: NEGATIVE
Spec Grav, UA: >=1.03 — AB (ref 1.010–1.025)
Urobilinogen, UA: 0.2 E.U./dL
pH, UA: 6.5 (ref 5.0–8.0)

## 2018-10-27 MED ORDER — VENLAFAXINE HCL ER 75 MG PO CP24: 225.0000 mg | ORAL_CAPSULE | Freq: Every day | ORAL | 1 refills | Status: DC

## 2018-10-27 NOTE — Progress Notes (Signed)
Patient: Victoria Dean Female    DOB: Aug 13, 1950   68 y.o.   MRN: [id number] Visit Date: 10/27/2018  Today's Provider: [name], MD   Chief Complaint  Patient presents with  . Follow-up   Subjective:     HPI  2 month follow up. Patient would like to discuss weight gain, depression, and husband's recent diagnosis of Parkinson's.  Her husband is in decline and she is worried. She has gained 30 years since quitting smoking 04/19/18. She complains about ongoing urinary control issues.  Allergies  Allergen Reactions  . Statins     Muscle weakness  . Sulfa Antibiotics   . Zetia [Ezetimibe]     Leg pain, upset stomach     Current Outpatient Medications:  .  Acetaminophen (TYLENOL ARTHRITIS PAIN PO), Take by mouth as needed (pain). , Disp: , Rfl:  .  Cholecalciferol (VITAMIN D3) 3000 units TABS, Take by mouth daily. , Disp: , Rfl:  .  diazepam (VALIUM) 5 MG tablet, Take 1 tablet (5 mg total) by mouth at bedtime as needed for anxiety., Disp: 30 tablet, Rfl: 5 .  losartan (COZAAR) 50 MG tablet, TAKE 1 TABLET DAILY., Disp: 90 tablet, Rfl: 3 .  rosuvastatin (CRESTOR) 5 MG tablet, Take 1 tablet (5 mg total) by mouth daily., Disp: 90 tablet, Rfl: 3 .  triamterene-hydrochlorothiazide (DYAZIDE) 37.5-25 MG capsule, TAKE 1 CAPSULE EVERY DAY, Disp: 90 capsule, Rfl: 3 .  venlafaxine XR (EFFEXOR-XR) 150 MG 24 hr capsule, Take 1 capsule (150 mg total) by mouth daily with breakfast., Disp: 90 capsule, Rfl: 3 .  doxycycline (VIBRA-TABS) 100 MG tablet, Take 1 tablet (100 mg total) by mouth 2 (two) times daily. (Patient not taking: Reported on 02/16/2018), Disp: 20 tablet, Rfl: 0  Review of Systems  Constitutional: Positive for unexpected weight change.  Respiratory: Negative.   Cardiovascular: Negative.   Endocrine: Negative.   Genitourinary: Positive for enuresis.  Musculoskeletal: Positive for arthralgias and back pain.  Skin: Negative for color change, pallor, rash and wound.   Allergic/Immunologic: Negative.   Neurological: Negative.   Hematological: Negative.   Psychiatric/Behavioral: Positive for dysphoric mood. The patient is nervous/anxious.     Social History   Tobacco Use  . Smoking status: Current Every Day Smoker    Packs/day: 0.91    Years: 33.00    Pack years: 30.03  . Smokeless tobacco: Never Used  Substance Use Topics  . Alcohol use: Yes    Comment: Rare- maybe once year      Objective:   BP 124/68 (BP Location: Right Arm, Patient Position: Sitting, Cuff Size: Large)   Pulse (!) 57   Temp (!) 96.7 F (35.9 C) (Temporal)   Resp 18   Wt 215 lb (97.5 kg)   SpO2 95%   BMI 38.09 kg/m  Vitals:   10/27/18 1043  BP: 124/68  Pulse: (!) 57  Resp: 18  Temp: (!) 96.7 F (35.9 C)  TempSrc: Temporal  SpO2: 95%  Weight: 215 lb (97.5 kg)  Body mass index is 38.09 kg/m.   Physical Exam   No results found for any visits on 10/27/18.     Assessment & Plan    1. Enuresis Might need uro/gyn referral. - POCT urinalysis dipstick - Urine Culture  2. Anxiety, generalized  - venlafaxine XR (EFFEXOR XR) 75 MG 24 hr capsule; Take 3 capsules (225 mg total) by mouth daily with breakfast.  Dispense: 270 capsule; Refill: 1  3.  Other depression Increase from 150 to 225 Effexor.  4. Essential (primary) hypertension   5. Obstructive sleep apnea syndrome   6. Mild episode of recurrent major depressive disorder (HCC)   7. Class 2 severe obesity due to excess calories with serious comorbidity and body mass index (BMI) of 38.0 to 38.9 in adult [facility]) More than 50% 25 minute visit spent in counseling or coordination of care Pt has gained more weight since stopping smoking.     Richard Cranford Mon, MD  [facility]

## 2018-10-29 LAB — URINE CULTURE

## 2018-11-01 ENCOUNTER — Telehealth: Payer: Self-pay

## 2018-11-01 NOTE — Telephone Encounter (Signed)
Patient notified of results.

## 2018-11-01 NOTE — Telephone Encounter (Signed)
-----   Message from Jerrol Banana., MD sent at 10/29/2018  1:27 PM EDT ----- Urine culture okay.

## 2018-11-30 ENCOUNTER — Ambulatory Visit: Payer: Self-pay | Admitting: Family Medicine

## 2018-11-30 NOTE — Progress Notes (Signed)
Patient: Victoria Dean Female    DOB: 11/26/1950   68 y.o.   MRN: [id number] Visit Date: 12/02/2018  Today's Provider: [name], MD   Chief Complaint  Patient presents with  . Follow up for HTN, Depression   Subjective:     HPI   Enuresis Has not had an appointment yet. From 10/27/2018-Might need uro/gyn referral. Urine Culture okay.   Anxiety, generalized From 10/27/2018-refilled venlafaxine XR (EFFEXOR XR) 75 MG 24 hr capsule; Take 3 capsules (225 mg total) by mouth daily with breakfast.    Increased dose of Effexor is helped her feel better.  Mild episode of recurrent major depressive disorder ([place]) From 10/27/2018-Increased from 150 to 225 Effexor.  Essential (primary) hypertension From 10/27/2018-currently taking triamterene-hydrochlorothiazide (DYAZIDE) 37.5-25 MG capsule and losartan (COZAAR) 50 MG tablet. Compliant with medications.       Allergies  Allergen Reactions  . Statins     Muscle weakness  . Sulfa Antibiotics   . Zetia [Ezetimibe]     Leg pain, upset stomach     Current Outpatient Medications:  .  Acetaminophen (TYLENOL ARTHRITIS PAIN PO), Take by mouth as needed (pain). , Disp: , Rfl:  .  Cholecalciferol (VITAMIN D3) 3000 units TABS, Take by mouth daily. , Disp: , Rfl:  .  diazepam (VALIUM) 5 MG tablet, Take 1 tablet (5 mg total) by mouth at bedtime as needed for anxiety., Disp: 30 tablet, Rfl: 5 .  losartan (COZAAR) 50 MG tablet, TAKE 1 TABLET DAILY., Disp: 90 tablet, Rfl: 3 .  rosuvastatin (CRESTOR) 5 MG tablet, Take 1 tablet (5 mg total) by mouth daily., Disp: 90 tablet, Rfl: 3 .  triamterene-hydrochlorothiazide (DYAZIDE) 37.5-25 MG capsule, TAKE 1 CAPSULE EVERY DAY, Disp: 90 capsule, Rfl: 3 .  venlafaxine XR (EFFEXOR XR) 75 MG 24 hr capsule, Take 3 capsules (225 mg total) by mouth daily with breakfast., Disp: 270 capsule, Rfl: 1 .  doxycycline (VIBRA-TABS) 100 MG tablet, Take 1 tablet (100 mg total) by mouth 2 (two) times  daily. (Patient not taking: Reported on 02/16/2018), Disp: 20 tablet, Rfl: 0  Review of Systems  Constitutional: Negative for appetite change, chills, fatigue and fever.  Eyes: Negative.   Respiratory: Negative for chest tightness and shortness of breath.   Cardiovascular: Negative for chest pain and palpitations.  Gastrointestinal: Negative for abdominal pain, nausea and vomiting.  Endocrine: Negative.   Genitourinary: Positive for enuresis.  Musculoskeletal: Positive for back pain.       Back pain slowly getting worse again  Allergic/Immunologic: Negative.   Neurological: Negative for dizziness and weakness.  Hematological: Negative.     Social History   Tobacco Use  . Smoking status: Former Smoker    Packs/day: 0.91    Years: 33.00    Pack years: 30.03    Types: Cigarettes    Quit date: 04/19/2018    Years since quitting: 0.6  . Smokeless tobacco: Never Used  Substance Use Topics  . Alcohol use: Yes    Comment: Rare- maybe once year      Objective:   BP 131/80   Pulse 88   Temp (!) 97.3 F (36.3 C) (Temporal)   Resp 20   Ht 5\' 3"  (1.6 m)   Wt 218 lb (98.9 kg)   BMI 38.62 kg/m  Vitals:   12/02/18 1043  BP: 131/80  Pulse: 88  Resp: 20  Temp: (!) 97.3 F (36.3 C)  TempSrc: Temporal  Weight: 218 lb (  98.9 kg)  Height: 5\' 3"  (1.6 m)  Body mass index is 38.62 kg/m.   Physical Exam Vitals signs reviewed.  Constitutional:      Appearance: She is well-developed.  HENT:     Head: Normocephalic and atraumatic.  Eyes:     Conjunctiva/sclera: Conjunctivae normal.     Pupils: Pupils are equal, round, and reactive to light.  Neck:     Musculoskeletal: Normal range of motion and neck supple.  Cardiovascular:     Rate and Rhythm: Normal rate and regular rhythm.     Heart sounds: Normal heart sounds.  Pulmonary:     Effort: Pulmonary effort is normal.     Breath sounds: Normal breath sounds.  Abdominal:     Palpations: Abdomen is soft.  Musculoskeletal:  Normal range of motion.  Skin:    General: Skin is warm and dry.     Comments: Very fair skin.  Neurological:     Mental Status: She is alert and oriented to person, place, and time.     Deep Tendon Reflexes: Reflexes are normal and symmetric.  Psychiatric:        Behavior: Behavior normal.        Thought Content: Thought content normal.        Judgment: Judgment normal.      No results found for any visits on 12/02/18.     Assessment & Plan    1. Essential (primary) hypertension Controlled - CBC with Differential/Platelet - Comprehensive metabolic panel - Lipid panel - TSH  2. Pure hypercholesterolemia On Crestor - CBC with Differential/Platelet - Comprehensive metabolic panel - Lipid panel - TSH  3. Anxiety, generalized Improved on venlafaxine - CBC with Differential/Platelet - Comprehensive metabolic panel - Lipid panel - TSH - diazepam (VALIUM) 5 MG tablet; Take 1 tablet (5 mg total) by mouth at bedtime as needed for anxiety.  Dispense: 30 tablet; Refill: 5  4. Enuresis This seems to be mainly stress incontinence.  Discussed tingling and exercise.  Consider uro-Gyn referral  5. Obstructive sleep apnea syndrome On CPAP  6. Mild episode of recurrent major depressive disorder (HCC) In partial remission  7. Personal history of tobacco use, presenting hazards to health Patient encouraged to stop smoking  8. DDD (degenerative disc disease), lumbar Starting to recur again.  She has done fairly well the past couple years.  9. History of lumbar laminectomy Per Dr. Erma Heritage.     Richard Cranford Mon, MD  [facility]

## 2018-12-02 ENCOUNTER — Encounter: Payer: Self-pay | Admitting: Family Medicine

## 2018-12-02 ENCOUNTER — Other Ambulatory Visit: Payer: Self-pay

## 2018-12-02 ENCOUNTER — Ambulatory Visit (INDEPENDENT_AMBULATORY_CARE_PROVIDER_SITE_OTHER): Payer: Medicare PPO | Admitting: Family Medicine

## 2018-12-02 VITALS — BP 131/80 | HR 88 | Temp 97.3°F | Resp 20 | Ht 63.0 in | Wt 218.0 lb

## 2018-12-02 DIAGNOSIS — E7800 Pure hypercholesterolemia, unspecified: Secondary | ICD-10-CM

## 2018-12-02 DIAGNOSIS — F411 Generalized anxiety disorder: Secondary | ICD-10-CM

## 2018-12-02 DIAGNOSIS — Z9889 Other specified postprocedural states: Secondary | ICD-10-CM

## 2018-12-02 DIAGNOSIS — F330 Major depressive disorder, recurrent, mild: Secondary | ICD-10-CM | POA: Diagnosis not present

## 2018-12-02 DIAGNOSIS — G4733 Obstructive sleep apnea (adult) (pediatric): Secondary | ICD-10-CM | POA: Diagnosis not present

## 2018-12-02 DIAGNOSIS — I10 Essential (primary) hypertension: Secondary | ICD-10-CM

## 2018-12-02 DIAGNOSIS — M5136 Other intervertebral disc degeneration, lumbar region: Secondary | ICD-10-CM | POA: Diagnosis not present

## 2018-12-02 DIAGNOSIS — Z87891 Personal history of nicotine dependence: Secondary | ICD-10-CM

## 2018-12-02 DIAGNOSIS — R32 Unspecified urinary incontinence: Secondary | ICD-10-CM

## 2018-12-02 MED ORDER — DIAZEPAM 5 MG PO TABS: 5.0000 mg | ORAL_TABLET | Freq: Every evening | ORAL | 5 refills | Status: DC | PRN

## 2018-12-02 NOTE — Patient Instructions (Signed)
Please make sure you are fasting for your labs given to you today. Follow all changes made if any during your appointment. Return back to the clinic again for your follow up in 4-6 months.

## 2018-12-03 LAB — CBC WITH DIFFERENTIAL/PLATELET
Basophils Absolute: 0.1 10*3/uL (ref 0.0–0.2)
Basos: 1 %
EOS (ABSOLUTE): 0.2 10*3/uL (ref 0.0–0.4)
Eos: 2 %
Hematocrit: 35.2 % (ref 34.0–46.6)
Hemoglobin: 11.6 g/dL (ref 11.1–15.9)
Immature Grans (Abs): 0 10*3/uL (ref 0.0–0.1)
Immature Granulocytes: 0 %
Lymphocytes Absolute: 2.6 10*3/uL (ref 0.7–3.1)
Lymphs: 31 %
MCH: 29.7 pg (ref 26.6–33.0)
MCHC: 33 g/dL (ref 31.5–35.7)
MCV: 90 fL (ref 79–97)
Monocytes Absolute: 0.8 10*3/uL (ref 0.1–0.9)
Monocytes: 10 %
Neutrophils Absolute: 4.6 10*3/uL (ref 1.4–7.0)
Neutrophils: 56 %
Platelets: 284 10*3/uL (ref 150–450)
RBC: 3.91 x10E6/uL (ref 3.77–5.28)
RDW: 13.2 % (ref 11.7–15.4)
WBC: 8.3 10*3/uL (ref 3.4–10.8)

## 2018-12-03 LAB — COMPREHENSIVE METABOLIC PANEL
ALT: 17 IU/L (ref 0–32)
AST: 16 IU/L (ref 0–40)
Albumin/Globulin Ratio: 1.9 (ref 1.2–2.2)
Albumin: 4.5 g/dL (ref 3.8–4.8)
Alkaline Phosphatase: 153 IU/L — ABNORMAL HIGH (ref 39–117)
BUN/Creatinine Ratio: 18 (ref 12–28)
BUN: 18 mg/dL (ref 8–27)
Bilirubin Total: 0.3 mg/dL (ref 0.0–1.2)
CO2: 26 mmol/L (ref 20–29)
Calcium: 9.6 mg/dL (ref 8.7–10.3)
Chloride: 98 mmol/L (ref 96–106)
Creatinine, Ser: 1.02 mg/dL — ABNORMAL HIGH (ref 0.57–1.00)
GFR calc Af Amer: 65 mL/min/{1.73_m2} (ref >59)
GFR calc non Af Amer: 56 mL/min/{1.73_m2} — ABNORMAL LOW (ref >59)
Globulin, Total: 2.4 g/dL (ref 1.5–4.5)
Glucose: 87 mg/dL (ref 65–99)
Potassium: 4.4 mmol/L (ref 3.5–5.2)
Sodium: 138 mmol/L (ref 134–144)
Total Protein: 6.9 g/dL (ref 6.0–8.5)

## 2018-12-03 LAB — LIPID PANEL
Chol/HDL Ratio: 2.6 ratio (ref 0.0–4.4)
Cholesterol, Total: 166 mg/dL (ref 100–199)
HDL: 63 mg/dL (ref >39)
LDL Chol Calc (NIH): 82 mg/dL (ref 0–99)
Triglycerides: 119 mg/dL (ref 0–149)
VLDL Cholesterol Cal: 21 mg/dL (ref 5–40)

## 2018-12-03 LAB — TSH: TSH: 1.1 u[IU]/mL (ref 0.450–4.500)

## 2018-12-06 ENCOUNTER — Telehealth: Payer: Self-pay

## 2018-12-06 NOTE — Telephone Encounter (Signed)
-----   Message from Jerrol Banana., MD sent at 12/03/2018 11:32 AM EDT ----- Labs stable.

## 2018-12-06 NOTE — Telephone Encounter (Signed)
Patient has been advised. KW 

## 2018-12-06 NOTE — Telephone Encounter (Signed)
LMTCB

## 2019-01-06 DIAGNOSIS — H25813 Combined forms of age-related cataract, bilateral: Secondary | ICD-10-CM | POA: Diagnosis not present

## 2019-01-12 ENCOUNTER — Other Ambulatory Visit: Payer: Self-pay | Admitting: Family Medicine

## 2019-01-12 ENCOUNTER — Other Ambulatory Visit: Payer: Self-pay | Admitting: *Deleted

## 2019-01-12 DIAGNOSIS — I10 Essential (primary) hypertension: Secondary | ICD-10-CM

## 2019-01-12 DIAGNOSIS — Z20822 Contact with and (suspected) exposure to covid-19: Secondary | ICD-10-CM

## 2019-01-14 LAB — NOVEL CORONAVIRUS, NAA: SARS-CoV-2, NAA: NOT DETECTED

## 2019-01-21 ENCOUNTER — Emergency Department (HOSPITAL_COMMUNITY): Payer: Medicare PPO

## 2019-01-21 ENCOUNTER — Ambulatory Visit: Payer: Self-pay | Admitting: *Deleted

## 2019-01-21 ENCOUNTER — Encounter (HOSPITAL_COMMUNITY): Payer: Self-pay | Admitting: Emergency Medicine

## 2019-01-21 ENCOUNTER — Other Ambulatory Visit: Payer: Self-pay

## 2019-01-21 ENCOUNTER — Emergency Department (HOSPITAL_COMMUNITY)
Admission: EM | Admit: 2019-01-21 | Discharge: 2019-01-21 | Disposition: A | Discharge status: Home / Self Care | Payer: Medicare PPO | Attending: Emergency Medicine | Admitting: Emergency Medicine

## 2019-01-21 DIAGNOSIS — I10 Essential (primary) hypertension: Secondary | ICD-10-CM | POA: Insufficient documentation

## 2019-01-21 DIAGNOSIS — M5416 Radiculopathy, lumbar region: Secondary | ICD-10-CM | POA: Insufficient documentation

## 2019-01-21 DIAGNOSIS — M545 Low back pain: Secondary | ICD-10-CM | POA: Diagnosis present

## 2019-01-21 DIAGNOSIS — M546 Pain in thoracic spine: Secondary | ICD-10-CM | POA: Diagnosis not present

## 2019-01-21 DIAGNOSIS — M5124 Other intervertebral disc displacement, thoracic region: Secondary | ICD-10-CM | POA: Diagnosis not present

## 2019-01-21 DIAGNOSIS — M48061 Spinal stenosis, lumbar region without neurogenic claudication: Secondary | ICD-10-CM | POA: Diagnosis not present

## 2019-01-21 DIAGNOSIS — M5126 Other intervertebral disc displacement, lumbar region: Secondary | ICD-10-CM | POA: Diagnosis not present

## 2019-01-21 DIAGNOSIS — Z87891 Personal history of nicotine dependence: Secondary | ICD-10-CM | POA: Diagnosis not present

## 2019-01-21 LAB — URINALYSIS, ROUTINE W REFLEX MICROSCOPIC
Bilirubin Urine: NEGATIVE
Glucose, UA: NEGATIVE mg/dL
Hgb urine dipstick: NEGATIVE
Ketones, ur: NEGATIVE mg/dL
Leukocytes,Ua: NEGATIVE
Nitrite: NEGATIVE
Protein, ur: NEGATIVE mg/dL
Specific Gravity, Urine: 1.015 (ref 1.005–1.030)
pH: 7 (ref 5.0–8.0)

## 2019-01-21 LAB — CBC
HCT: 36.5 % (ref 36.0–46.0)
Hemoglobin: 11.7 g/dL — ABNORMAL LOW (ref 12.0–15.0)
MCH: 29.7 pg (ref 26.0–34.0)
MCHC: 32.1 g/dL (ref 30.0–36.0)
MCV: 92.6 fL (ref 80.0–100.0)
Platelets: 299 10*3/uL (ref 150–400)
RBC: 3.94 MIL/uL (ref 3.87–5.11)
RDW: 14.2 % (ref 11.5–15.5)
WBC: 11.2 10*3/uL — ABNORMAL HIGH (ref 4.0–10.5)
nRBC: 0 % (ref 0.0–0.2)

## 2019-01-21 LAB — COMPREHENSIVE METABOLIC PANEL
ALT: 24 U/L (ref 0–44)
AST: 23 U/L (ref 15–41)
Albumin: 4.1 g/dL (ref 3.5–5.0)
Alkaline Phosphatase: 133 U/L — ABNORMAL HIGH (ref 38–126)
Anion gap: 10 (ref 5–15)
BUN: 18 mg/dL (ref 8–23)
CO2: 28 mmol/L (ref 22–32)
Calcium: 9.3 mg/dL (ref 8.9–10.3)
Chloride: 99 mmol/L (ref 98–111)
Creatinine, Ser: 0.84 mg/dL (ref 0.44–1.00)
GFR calc Af Amer: >60 mL/min (ref >60)
GFR calc non Af Amer: >60 mL/min (ref >60)
Glucose, Bld: 102 mg/dL — ABNORMAL HIGH (ref 70–99)
Potassium: 3.6 mmol/L (ref 3.5–5.1)
Sodium: 137 mmol/L (ref 135–145)
Total Bilirubin: 0.4 mg/dL (ref 0.3–1.2)
Total Protein: 7.5 g/dL (ref 6.5–8.1)

## 2019-01-21 MED ORDER — HYDROMORPHONE HCL 1 MG/ML IJ SOLN
0.5000 mg | Freq: Once | INTRAMUSCULAR | Status: AC
  Administered 2019-01-21: 17:00:00 0.5 mg via INTRAVENOUS
  Filled 2019-01-21: qty 1

## 2019-01-21 MED ORDER — PREDNISONE 50 MG PO TABS
60.0000 mg | ORAL_TABLET | Freq: Once | ORAL | Status: AC
  Administered 2019-01-21: 60 mg via ORAL
  Filled 2019-01-21: qty 1

## 2019-01-21 MED ORDER — OXYCODONE HCL 5 MG PO TABS: 5.0000 mg | ORAL_TABLET | ORAL | 0 refills | Status: DC | PRN

## 2019-01-21 MED ORDER — HYDROMORPHONE HCL 1 MG/ML IJ SOLN
0.5000 mg | Freq: Once | INTRAMUSCULAR | Status: AC
  Administered 2019-01-21: 0.5 mg via INTRAVENOUS
  Filled 2019-01-21: qty 1

## 2019-01-21 MED ORDER — PREDNISONE 20 MG PO TABS: 40.0000 mg | ORAL_TABLET | Freq: Every day | ORAL | 0 refills | Status: AC

## 2019-01-21 NOTE — ED Notes (Signed)
From rad 

## 2019-01-21 NOTE — ED Notes (Signed)
Physician in to assess 

## 2019-01-21 NOTE — ED Notes (Signed)
To Rad 

## 2019-01-21 NOTE — Telephone Encounter (Signed)
FYI

## 2019-01-21 NOTE — ED Triage Notes (Signed)
Patient reports R sided back pain that started after she bent over to take some pies out of the oven. Patient states she felt a "pop." Occurred Wed.

## 2019-01-21 NOTE — ED Provider Notes (Signed)
[facility] Emergency Department Provider Note MRN:  [id number]  Arrival date & time: 01/21/19     Chief Complaint   Back Pain   History of Present Illness   Victoria Dean is a 68 y.o. year-old female with a history of hypertension, degenerative disc disease presenting to the ED with chief complaint of back pain.  Acute worsening lower back pain about 1 week ago.  Denies trauma.  2 days ago, she was bending over to get something out of the oven when the pain became even worse.  Pain is located in the lower right lumbar back and radiates to the right flank.  Worse with motion, moderate to severe.  Associated with worsening weakness to her right leg.  History of multiple back surgeries.  Denies bowel or bladder dysfunction, no numbness, no chest pain or shortness of breath, no abdominal pain, no fever.  Review of Systems  A complete 10 system review of systems was obtained and all systems are negative except as noted in the HPI and PMH.   Patient's Health History    Past Medical History:  Diagnosis Date  . Cataract   . Hypercholesteremia   . Hypertension   . Major depressive disorder   . 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 Vitamin D deficiency     Past Surgical History:  Procedure Laterality Date  . ABDOMINAL HYSTERECTOMY  1995  . APPENDECTOMY  1994  . BREAST BIOPSY Right 1998   neg  . ESI    . MICRODISCECTOMY LUMBAR  2008   L4-5  . SQUAMOUS CELL CARCINOMA EXCISION    . TONSILLECTOMY  1971    Family History  Problem Relation Age of Onset  . Cancer Mother        Bone and breast cancer  . Breast cancer Mother 16  . Hypertension Brother   . COPD Father   . Hypertension Father     Social History   Socioeconomic History  . Marital status: Married    Spouse name: Not on file  . Number of children: 1  . Years of education: Not on file  . Highest education level: Bachelor's degree (e.g., BA,  AB, BS)  Occupational History  . Occupation: retired  Tobacco Use  . Smoking status: Former Smoker    Packs/day: 0.91    Years: 33.00    Pack years: 30.03    Types: Cigarettes    Quit date: 04/19/2018    Years since quitting: 0.7  . Smokeless tobacco: Never Used  Substance and Sexual Activity  . Alcohol use: Yes    Comment: Rare- maybe once year  . Drug use: No  . Sexual activity: Not Currently  Other Topics Concern  . Not on file  Social History Narrative  . Not on file   Social Determinants of Health   Financial Resource Strain: Low Risk   . Difficulty of Paying Living Expenses: Not hard at all  Food Insecurity: No Food Insecurity  . Worried About Charity fundraiser in the Last Year: Never true  . Ran Out of Food in the Last Year: Never true  Transportation Needs: No Transportation Needs  . Lack of Transportation (Medical): No  . Lack of Transportation (Non-Medical): No  Physical Activity: Inactive  . Days of Exercise per Week: 0 days  . Minutes of Exercise per Session: 0 min  Stress: No Stress Concern Present  .  Feeling of Stress : Only a little  Social Connections: Unknown  . Frequency of Communication with Friends and Family: Patient refused  . Frequency of Social Gatherings with Friends and Family: Patient refused  . Attends Religious Services: Patient refused  . Active Member of Clubs or Organizations: Patient refused  . Attends Archivist Meetings: Patient refused  . Marital Status: Patient refused  Intimate Partner Violence: Unknown  . Fear of Current or Ex-Partner: Patient refused  . Emotionally Abused: Patient refused  . Physically Abused: Patient refused  . Sexually Abused: Patient refused     Physical Exam  Vital Signs and Nursing Notes reviewed Vitals:   01/21/19 1837 01/21/19 1930  BP: (!) 143/67 134/60  Pulse: 90 86  Resp: 16   Temp:  98.2 F (36.8 C)  SpO2: 97% 98%    CONSTITUTIONAL: Well-appearing, NAD NEURO:  Alert and  oriented x 3, mild decreased strength to the right leg, symmetric sensation EYES:  eyes equal and reactive ENT/NECK:  no LAD, no JVD CARDIO: Regular rate, well-perfused, normal S1 and S2 PULM:  CTAB no wheezing or rhonchi GI/GU:  normal bowel sounds, non-distended, non-tender MSK/SPINE:  No gross deformities, no edema SKIN:  no rash, atraumatic PSYCH:  Appropriate speech and behavior  Diagnostic and Interventional Summary    EKG Interpretation  Date/Time:    Ventricular Rate:    PR Interval:    QRS Duration:   QT Interval:    QTC Calculation:   R Axis:     Text Interpretation:        Labs Reviewed  CBC - Abnormal; Notable for the following components:      Result Value   WBC 11.2 (*)    Hemoglobin 11.7 (*)    All other components within normal limits  COMPREHENSIVE METABOLIC PANEL - Abnormal; Notable for the following components:   Glucose, Bld 102 (*)    Alkaline Phosphatase 133 (*)    All other components within normal limits  URINALYSIS, ROUTINE W REFLEX MICROSCOPIC - Abnormal; Notable for the following components:   APPearance HAZY (*)    All other components within normal limits    MR THORACIC SPINE WO CONTRAST  Final Result    MR LUMBAR SPINE WO CONTRAST  Final Result      Medications  HYDROmorphone (DILAUDID) injection 0.5 mg (has no administration in time range)  HYDROmorphone (DILAUDID) injection 0.5 mg (0.5 mg Intravenous Given 01/21/19 1704)  predniSONE (DELTASONE) tablet 60 mg (60 mg Oral Given 01/21/19 1958)     Procedures  /  Critical Care Procedures  ED Course and Medical Decision Making  I have reviewed the triage vital signs and the nursing notes.  Pertinent labs & imaging results that were available during my care of the patient were reviewed by me and considered in my medical decision making (see below for details).     Acute on chronic back pain with worsened or progressive neurological deficit, warranting MRI imaging especially given  her history of degenerative disc disease and back surgeries in the past.  Also considering renal or musculoskeletal etiology, screening with labs and urinalysis.  8 PM update: MRI reveals T12 peripheral nerve impingement, no cord involvement, likely explaining patient's symptoms.  She continues to have no signs of cauda equina, she is appropriate for discharge with follow-up with her back surgeon.  Labs and urinalysis reassuring showing no signs of other etiology.  Barth Kirks. Sedonia Small, Rive[place]o [email or web address]  Final Clinical Impressions(s) / ED Diagnoses     ICD-10-CM   1. Acute radicular low back pain  M54.16     ED Discharge Orders         Ordered    predniSONE (DELTASONE) 20 MG tablet  Daily     01/21/19 2001    oxyCODONE (ROXICODONE) 5 MG immediate release tablet  Every 4 hours PRN     01/21/19 2001           Discharge Instructions Discussed with and Provided to Patient:     Discharge Instructions     You were evaluated in the Emergency Department and after careful evaluation, we did not find any emergent condition requiring admission or further testing in the hospital.  Your exam/testing today was overall reassuring.  Your MRI showed a pinched nerve in your back.  Please take the medications provided as directed and follow-up with your back surgeon.  Please return to the Emergency Department if you experience any worsening of your condition.  We encourage you to follow up with a primary care provider.  Thank you for allowing [place] to be a part of your care.        Maudie Flakes, MD 01/21/19 2003

## 2019-01-21 NOTE — Discharge Instructions (Addendum)
You were evaluated in the Emergency Department and after careful evaluation, we did not find any emergent condition requiring admission or further testing in the hospital.  Your exam/testing today was overall reassuring.  Your MRI showed a pinched nerve in your back.  Please take the medications provided as directed and follow-up with your back surgeon.  Please return to the Emergency Department if you experience any worsening of your condition.  We encourage you to follow up with a primary care provider.  Thank you for allowing [place] to be a part of your care.

## 2019-01-21 NOTE — ED Notes (Signed)
Pt reports taking pie out of the oven 10 days ago and heard a pop  Back pain since then   Unrelieved by OTC meds  Did not try to see PCP until today and when she called they told her to go to the ED  Here for eval

## 2019-01-21 NOTE — Telephone Encounter (Signed)
Pt called with having pain on the right side that goes from her lower rib to the top of her hip. It is between her side and back. She has had a pain like this for about a week but today it has gotten worst.  She bent down to get something and felt something pop and the pain got worst. She has some weakness in her right leg that she can feel when she stands or tries to walk. But she is able to stand and walk.  Denies urinary problems or numbness or fever. She has take Tylenol and Ibuprofen but neither helped. She has had similar pain before and thinks it may be a ruptured disc. Per protocol, she would need to be seen within 4 hrs. [facility] notified regarding an appointment, none available within the 4 hour period. Pt advised to go to the ED. She voiced understanding. Routing to [facility] for review.   Reason for Disposition . [1] SEVERE back pain (e.g., excruciating, unable to do any normal activities) AND [2] not improved 2 hours after pain medicine  Answer Assessment - Initial Assessment Questions 1. ONSET: "When did the pain begin?"      For about a week but today has gotten worst 2. LOCATION: "Where does it hurt?" (upper, mid or lower back)     On right below her rib cage to top of her hip bone 3. SEVERITY: "How bad is the pain?"  (e.g., Scale 1-10; mild, moderate, or severe)   - MILD (1-3): doesn't interfere with normal activities    - MODERATE (4-7): interferes with normal activities or awakens from sleep    - SEVERE (8-10): excruciating pain, unable to do any normal activities      Pain # 7 4. PATTERN: "Is the pain constant?" (e.g., yes, no; constant, intermittent)      constant 5. RADIATION: "Does the pain shoot into your legs or elsewhere?"     Just in that the area 6. CAUSE:  "What do you think is causing the back pain?"      Ruptured disc 7. BACK OVERUSE:  "Any recent lifting of heavy objects, strenuous work or exercise?"     no 8.  MEDICATIONS: "What have you taken so far for the pain?" (e.g., nothing, acetaminophen, NSAIDS)     Acetaminophen and ibuprofen 9. NEUROLOGIC SYMPTOMS: "Do you have any weakness, numbness, or problems with bowel/bladder control?"     Some weakness in the right leg 10. OTHER SYMPTOMS: "Do you have any other symptoms?" (e.g., fever, abdominal pain, burning with urination, blood in urine)       no 11. PREGNANCY: "Is there any chance you are pregnant?" (e.g., yes, no; LMP)       n/a  Protocols used: BACK PAIN-A-AH

## 2019-01-22 ENCOUNTER — Telehealth (HOSPITAL_COMMUNITY): Payer: Self-pay | Admitting: Emergency Medicine

## 2019-01-22 MED ORDER — OXYCODONE HCL 5 MG PO TABS: 2.5000 mg | ORAL_TABLET | Freq: Four times a day (QID) | ORAL | 0 refills | Status: DC | PRN

## 2019-01-22 MED ORDER — METHYLPREDNISOLONE 4 MG PO TBPK: ORAL_TABLET | ORAL | 0 refills | Status: DC

## 2019-01-22 NOTE — Telephone Encounter (Signed)
Patient medications prescribed to the wrong pharmacy at [place] yesterday. I have prescribed them to the correct pharmacy.

## 2019-01-24 DIAGNOSIS — M545 Low back pain, unspecified: Secondary | ICD-10-CM | POA: Insufficient documentation

## 2019-01-24 DIAGNOSIS — Z9089 Acquired absence of other organs: Secondary | ICD-10-CM | POA: Diagnosis not present

## 2019-01-27 DIAGNOSIS — M5414 Radiculopathy, thoracic region: Secondary | ICD-10-CM | POA: Diagnosis not present

## 2019-01-27 DIAGNOSIS — M5136 Other intervertebral disc degeneration, lumbar region: Secondary | ICD-10-CM | POA: Diagnosis not present

## 2019-02-02 DIAGNOSIS — M5416 Radiculopathy, lumbar region: Secondary | ICD-10-CM | POA: Diagnosis not present

## 2019-02-16 ENCOUNTER — Telehealth: Payer: Self-pay

## 2019-02-16 DIAGNOSIS — Z87891 Personal history of nicotine dependence: Secondary | ICD-10-CM

## 2019-02-16 NOTE — Telephone Encounter (Signed)
Patient has been notified that lung cancer screening CT scan is due currently or will be in near future. Confirmed that patient is within the appropriate age range, and asymptomatic, (no signs or symptoms of lung cancer). Patient denies illness that would prevent curative treatment for lung cancer if found. Verified smoking history (quit march 16th 2020) Patient is agreeable for CT scan being scheduled 10am or later any day of the week.

## 2019-02-18 NOTE — Addendum Note (Signed)
Addended by: Lieutenant Diego on: 02/18/2019 10:58 AM   Modules accepted: Orders

## 2019-02-18 NOTE — Telephone Encounter (Signed)
Smoking history: former, quit 04/19/18, 30 pack year

## 2019-02-22 ENCOUNTER — Encounter: Payer: Medicare PPO | Admitting: Family Medicine

## 2019-02-22 ENCOUNTER — Ambulatory Visit: Payer: Medicare PPO

## 2019-02-23 ENCOUNTER — Ambulatory Visit: Payer: Self-pay | Admitting: Family Medicine

## 2019-02-25 ENCOUNTER — Ambulatory Visit
Admission: RE | Admit: 2019-02-25 | Discharge: 2019-02-25 | Disposition: A | Discharge status: Home / Self Care | Payer: Medicare PPO | Source: Ambulatory Visit | Attending: Nurse Practitioner | Admitting: Nurse Practitioner

## 2019-02-25 ENCOUNTER — Other Ambulatory Visit: Payer: Self-pay

## 2019-02-25 DIAGNOSIS — I2510 Atherosclerotic heart disease of native coronary artery without angina pectoris: Secondary | ICD-10-CM | POA: Insufficient documentation

## 2019-02-25 DIAGNOSIS — J432 Centrilobular emphysema: Secondary | ICD-10-CM | POA: Insufficient documentation

## 2019-02-25 DIAGNOSIS — Z87891 Personal history of nicotine dependence: Secondary | ICD-10-CM | POA: Insufficient documentation

## 2019-02-25 DIAGNOSIS — I700 Atherosclerosis of aorta: Secondary | ICD-10-CM | POA: Insufficient documentation

## 2019-02-25 DIAGNOSIS — Z122 Encounter for screening for malignant neoplasm of respiratory organs: Secondary | ICD-10-CM | POA: Insufficient documentation

## 2019-03-01 ENCOUNTER — Telehealth: Payer: Self-pay | Admitting: *Deleted

## 2019-03-01 NOTE — Telephone Encounter (Signed)
Notified patient of LDCT lung cancer screening program results with recommendation for 12 month follow up imaging. Also notified of incidental findings noted below and is encouraged to discuss further with PCP who will receive a copy of this note and/or the CT report. Patient verbalizes understanding.   IMPRESSION: 1. Lung-RADS 2S, benign appearance or behavior. Continue annual screening with low-dose chest CT without contrast in 12 months. 2. The "S" modifier above refers to potentially clinically significant non lung cancer related findings. Specifically, there is aortic atherosclerosis, in addition to 2 vessel coronary artery disease. Please note that although the presence of coronary artery calcium documents the presence of coronary artery disease, the severity of this disease and any potential stenosis cannot be assessed on this non-gated CT examination. Assessment for potential risk factor modification, dietary therapy or pharmacologic therapy may be warranted, if clinically indicated. 3. Mild diffuse bronchial wall thickening with very mild centrilobular and paraseptal emphysema; imaging findings suggestive of underlying COPD.  Aortic Atherosclerosis (ICD10-I70.0) and Emphysema (ICD10

## 2019-04-11 ENCOUNTER — Telehealth: Payer: Self-pay

## 2019-04-11 NOTE — Telephone Encounter (Signed)
Called pt to schedule her AWV prior to her CPE that is scheduled for 04/28/19. Pt declined and stated that she received a $700 bill after completing her AWV and CPE last year. Advised pt that the AWV does not cost her. Pt stated that she is aware she spoke to PCP about other things during the CPE but that was too much. Pt declined scheduling an AWV this year. FYI to PCP!

## 2019-04-25 ENCOUNTER — Other Ambulatory Visit: Payer: Self-pay | Admitting: Family Medicine

## 2019-04-25 DIAGNOSIS — Z1231 Encounter for screening mammogram for malignant neoplasm of breast: Secondary | ICD-10-CM

## 2019-04-27 NOTE — Progress Notes (Signed)
Patient: Victoria Dean, Female    DOB: [birth year], 69 y.o.   MRN: [id number] Visit Date: 04/28/2019  Today's Provider: [name], MD   Chief Complaint  Patient presents with  . Annual Exam   Subjective:     Patient had AWV with [facility] 04/11/2019.   Complete Physical Victoria Dean is a 69 y.o. female. She feels fairly well. She reports she is not exercising. She reports she is sleeping fairly well. Her husband died earlier this year of an acute stroke.  She is coping fairly well.  She lives close to her only daughter and son-in-law and granddaughter.  She she quit smoking 1 year ago. She has ongoing issues with chronic back pain.  Over the last year she has gained a good bit of weight and now has dyspnea on exertion.  Occasional wheezing.  She has some heartburn chest discomfort when she lies down at night.  She was walking to the store with her granddaughter recently who pulled on her right arm and her shoulder popped and she has had pain in the shoulder with any abduction since that time.  -----------------------------------------------------------  Mammogram: 04/16/2018  Review of Systems  Constitutional: Positive for fatigue.  HENT: Positive for rhinorrhea and tinnitus.   Eyes: Negative.   Respiratory: Positive for shortness of breath.   Cardiovascular: Positive for chest pain.  Gastrointestinal: Negative.   Endocrine: Negative.   Genitourinary: Negative.   Musculoskeletal: Positive for arthralgias, back pain and myalgias.  Skin: Negative.   Allergic/Immunologic: Positive for environmental allergies.  Neurological: Negative.   Hematological: Negative.   Psychiatric/Behavioral: Negative.     Social History   Socioeconomic History  . Marital status: Widowed    Spouse name: Not on file  . Number of children: 1  . Years of education: Not on file  . Highest education level: Bachelor's degree (e.g., BA, AB, BS)  Occupational History  . Occupation: retired    Tobacco Use  . Smoking status: Former Smoker    Packs/day: 0.91    Years: 33.00    Pack years: 30.03    Types: Cigarettes    Quit date: 04/19/2018    Years since quitting: 1.0  . Smokeless tobacco: Never Used  Substance and Sexual Activity  . Alcohol use: Yes    Comment: Rare- maybe once year  . Drug use: No  . Sexual activity: Not Currently  Other Topics Concern  . Not on file  Social History Narrative  . Not on file   Social Determinants of Health   Financial Resource Strain:   . Difficulty of Paying Living Expenses:   Food Insecurity:   . Worried About Charity fundraiser in the Last Year:   . Arboriculturist in the Last Year:   Transportation Needs:   . Film/video editor (Medical):   Marland Kitchen Lack of Transportation (Non-Medical):   Physical Activity:   . Days of Exercise per Week:   . Minutes of Exercise per Session:   Stress:   . Feeling of Stress :   Social Connections:   . Frequency of Communication with Friends and Family:   . Frequency of Social Gatherings with Friends and Family:   . Attends Religious Services:   . Active Member of Clubs or Organizations:   . Attends Archivist Meetings:   Marland Kitchen Marital Status:   Intimate Partner Violence:   . Fear of Current or Ex-Partner:   . Emotionally Abused:   .  Physically Abused:   . Sexually Abused:     Past Medical History:  Diagnosis Date  . Cataract   . Hypercholesteremia   . Hypertension   . Major depressive disorder   . 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 Vitamin D deficiency      Patient Active Problem List   Diagnosis Date Noted  . Personal history of tobacco use, presenting hazards to health 02/23/2018  . History of lumbar laminectomy 04/30/2016  . Actinic keratosis 11/21/2014  . Adaptation reaction 11/21/2014  . Allergic rhinitis 11/21/2014  . Cornu cutaneum 11/21/2014  . Clinical depression 11/21/2014  . Dermatitis, eczematoid  11/21/2014  . Enthesopathy 11/21/2014  . Essential (primary) hypertension 11/21/2014  . Anxiety, generalized 11/21/2014  . Herniated nucleus pulposus 11/21/2014  . Mild episode of recurrent major depressive disorder ([place]) 11/21/2014  . Adiposity 11/21/2014  . Arthritis, degenerative 11/21/2014  . Basal cell papilloma 11/21/2014  . Apnea, sleep 11/21/2014  . Avitaminosis D 11/21/2014    Past Surgical History:  Procedure Laterality Date  . ABDOMINAL HYSTERECTOMY  1995  . APPENDECTOMY  1994  . BREAST BIOPSY Right 1998   neg  . ESI    . MICRODISCECTOMY LUMBAR  2008   L4-5  . SQUAMOUS CELL CARCINOMA EXCISION    . TONSILLECTOMY  1971    Her family history includes Breast cancer (age of onset: 51) in her mother; COPD in her father; Cancer in her mother; Hypertension in her brother and father.   Current Outpatient Medications:  .  Acetaminophen (TYLENOL ARTHRITIS PAIN PO), Take by mouth as needed (pain). , Disp: , Rfl:  .  Cholecalciferol (VITAMIN D3) 3000 units TABS, Take by mouth daily. , Disp: , Rfl:  .  diazepam (VALIUM) 5 MG tablet, Take 1 tablet (5 mg total) by mouth at bedtime as needed for anxiety., Disp: 30 tablet, Rfl: 5 .  doxycycline (VIBRA-TABS) 100 MG tablet, Take 1 tablet (100 mg total) by mouth 2 (two) times daily. (Patient not taking: Reported on 02/16/2018), Disp: 20 tablet, Rfl: 0 .  losartan (COZAAR) 50 MG tablet, TAKE 1 TABLET EVERY DAY, Disp: 90 tablet, Rfl: 3 .  methylPREDNISolone (MEDROL DOSEPAK) 4 MG TBPK tablet, Use as directed, Disp: 21 tablet, Rfl: 0 .  oxyCODONE (ROXICODONE) 5 MG immediate release tablet, Take 0.5-1 tablets (2.5-5 mg total) by mouth every 6 (six) hours as needed for severe pain., Disp: 6 tablet, Rfl: 0 .  rosuvastatin (CRESTOR) 5 MG tablet, Take 1 tablet (5 mg total) by mouth daily., Disp: 90 tablet, Rfl: 3 .  triamterene-hydrochlorothiazide (DYAZIDE) 37.5-25 MG capsule, TAKE 1 CAPSULE EVERY DAY, Disp: 90 capsule, Rfl: 3 .  venlafaxine XR  (EFFEXOR XR) 75 MG 24 hr capsule, Take 3 capsules (225 mg total) by mouth daily with breakfast., Disp: 270 capsule, Rfl: 1  Patient Care Team: Jerrol Banana., MD as PCP - General (Family Medicine) Lorelee Cover., MD as Consulting Physician (Ophthalmology) Dimmig, Marcello Moores, MD as Referring Physician (Orthopedic Surgery) Oneta Rack, MD (Dermatology) Dasher, Rayvon Char, MD (Dermatology)     Objective:    Vitals: There were no vitals taken for this visit.  Physical Exam Vitals reviewed. Exam conducted with a chaperone present.  Constitutional:      Appearance: Normal appearance. She is well-developed.  HENT:     Head: Normocephalic and atraumatic.     Right Ear: External ear normal.  Left Ear: External ear normal.     Mouth/Throat:     Pharynx: Oropharynx is clear.  Eyes:     General: No scleral icterus.    Conjunctiva/sclera: Conjunctivae normal.     Pupils: Pupils are equal, round, and reactive to light.  Neck:     Vascular: No carotid bruit.  Cardiovascular:     Rate and Rhythm: Normal rate and regular rhythm.     Pulses: Normal pulses.     Heart sounds: Normal heart sounds.  Pulmonary:     Effort: Pulmonary effort is normal.     Breath sounds: Normal breath sounds.  Chest:     Breasts:        Right: No mass.        Left: No mass.  Abdominal:     Palpations: Abdomen is soft.  Genitourinary:    General: Normal vulva.     Comments: External genitalia reveals just atrophy..  Anal exam is normal. Musculoskeletal:        General: Normal range of motion.     Cervical back: Normal range of motion and neck supple.  Lymphadenopathy:     Cervical: No cervical adenopathy.  Skin:    General: Skin is warm and dry.     Comments: Very fair skin.  Neurological:     General: No focal deficit present.     Mental Status: She is alert and oriented to person, place, and time.     Deep Tendon Reflexes: Reflexes are normal and symmetric.  Psychiatric:        Mood and  Affect: Mood normal.        Behavior: Behavior normal.        Thought Content: Thought content normal.        Judgment: Judgment normal.     Activities of Daily Living No flowsheet data found.  Fall Risk Assessment Fall Risk  12/02/2018 02/16/2018 11/12/2016 01/24/2016 11/22/2014  Falls in the past year? 0 0 Yes No No  Number falls in past yr: 0 - 2 or more - -  Injury with Fall? 0 - No - -  Risk for fall due to : - - Impaired vision - -  Risk for fall due to: Comment - - wears eye glasses - -  Follow up Falls evaluation completed - Education provided;Falls prevention discussed - -     Depression Screen PHQ 2/9 Scores 12/02/2018 02/16/2018 08/10/2017 11/12/2016  PHQ - 2 Score 1 0 0 2  PHQ- 9 Score 6 - 1 3    6CIT Screen 01/24/2016  What Year? 0 points  What month? 0 points  What time? 0 points  Count back from 20 0 points  Months in reverse 0 points  Repeat phrase 0 points  Total Score 0       Assessment & Plan:    Annual Physical Reviewed patient's Family Medical History Reviewed and updated list of patient's medical providers Assessment of cognitive impairment was done Assessed patient's functional ability Established a written schedule for health screening Walker Completed and Reviewed  Exercise Activities and Dietary recommendations Goals    . Exercise 3x per week (30 min per time)     Recommend to walk 3 days a week for at least 30 minutes at a time.     . Increase water intake     Starting 01/24/16, I will continue drinking 4-6 glasses of water a day.    . Prevent Falls  Recommend to remove tripping hazards from home or request assistance with taking the dog outside       Immunization History  Administered Date(s) Administered  . Fluad Quad(high Dose 65+) 09/27/2018  . Hepatitis B 06/12/1995, 07/15/1995, 12/15/1995  . Influenza Split 01/01/2011, 11/24/2012  . Influenza, High Dose Seasonal PF 11/22/2014, 10/23/2015,  11/12/2016, 12/08/2017  . Influenza,inj,Quad PF,6+ Mos 11/13/2013  . Influenza,inj,quad, With Preservative 10/05/2015  . Pneumococcal Conjugate-13 10/11/2014  . Pneumococcal Polysaccharide-23 11/08/2008, 01/24/2016  . Pneumococcal-Unspecified 01/04/2015  . Td 06/21/2004  . Tdap 03/23/2014  . Zoster 11/13/2013    Health Maintenance  Topic Date Due  . Fecal DNA (Cologuard)  12/25/2019  . MAMMOGRAM  04/15/2020  . TETANUS/TDAP  03/23/2024  . DEXA SCAN  04/25/2024  . INFLUENZA VACCINE  Completed  . Hepatitis C Screening  Completed  . PNA vac Low Risk Adult  Completed     Discussed health benefits of physical activity, and encouraged her to engage in regular exercise appropriate for her age and condition.   1. Encounter for annual physical exam Patient declines colonoscopy.  He is willing to do mammogram.  2. Gastroesophageal reflux disease, unspecified whether esophagitis present Return to clinic 1 month after trying omeprazole daily - omeprazole (PRILOSEC) 20 MG capsule; Take 1 capsule (20 mg total) by mouth daily.  Dispense: 30 capsule; Refill: 0  3. Right shoulder pain, unspecified chronicity Refer to Dr. Sabra Heck. - Ambulatory referral to Orthopedic Surgery  4. Essential (primary) hypertension Controlled  5. Class 2 severe obesity due to excess calories with serious comorbidity and body mass index (BMI) of 38.0 to 38.9 in adult [facility]) Dietary changes discussed to help patient slowly lose the weight she has gained.  BMI now greater than 40.  6. Personal history of tobacco use, presenting hazards to health Patient quit 1 year  ago.  7. Pulmonary emphysema, unspecified emphysema type ([place]) Start [employer], 1 puff twice daily.  Samples given.  Return to clinic 1 month  ------------------------------------------------------------------------------------------------------------    [name], MD  [facility]

## 2019-04-28 ENCOUNTER — Other Ambulatory Visit: Payer: Self-pay

## 2019-04-28 ENCOUNTER — Ambulatory Visit: Payer: Self-pay | Admitting: Family Medicine

## 2019-04-28 ENCOUNTER — Encounter: Payer: Self-pay | Admitting: Family Medicine

## 2019-04-28 ENCOUNTER — Ambulatory Visit (INDEPENDENT_AMBULATORY_CARE_PROVIDER_SITE_OTHER): Payer: Medicare PPO | Admitting: Family Medicine

## 2019-04-28 VITALS — BP 143/84 | HR 90 | Temp 96.8°F | Ht 63.0 in | Wt 226.0 lb

## 2019-04-28 DIAGNOSIS — J439 Emphysema, unspecified: Secondary | ICD-10-CM | POA: Diagnosis not present

## 2019-04-28 DIAGNOSIS — M25511 Pain in right shoulder: Secondary | ICD-10-CM

## 2019-04-28 DIAGNOSIS — Z0000 Encounter for general adult medical examination without abnormal findings: Secondary | ICD-10-CM | POA: Diagnosis not present

## 2019-04-28 DIAGNOSIS — I10 Essential (primary) hypertension: Secondary | ICD-10-CM | POA: Diagnosis not present

## 2019-04-28 DIAGNOSIS — K219 Gastro-esophageal reflux disease without esophagitis: Secondary | ICD-10-CM | POA: Diagnosis not present

## 2019-04-28 DIAGNOSIS — Z6838 Body mass index (BMI) 38.0-38.9, adult: Secondary | ICD-10-CM

## 2019-04-28 DIAGNOSIS — Z87891 Personal history of nicotine dependence: Secondary | ICD-10-CM

## 2019-04-28 MED ORDER — OMEPRAZOLE 20 MG PO CPDR: 20.0000 mg | DELAYED_RELEASE_CAPSULE | Freq: Every day | ORAL | 0 refills | Status: DC

## 2019-04-28 NOTE — Patient Instructions (Signed)
For Breztri inhaler, Take 1 puff Twice A day as needed.

## 2019-05-06 DIAGNOSIS — M7541 Impingement syndrome of right shoulder: Secondary | ICD-10-CM | POA: Insufficient documentation

## 2019-05-30 NOTE — Progress Notes (Signed)
Established patient visit   Patient: Victoria Dean   DOB: 1950-12-17   69 y.o. Female  MRN: [id number] Visit Date: 06/02/2019  Today's healthcare provider: [name], MD   Chief Complaint  Patient presents with  . Follow-up  . Gastroesophageal Reflux   Subjective    HPI  Overall she is doing well.  Today would have been her 23th wedding anniversary had her husband passed away earlier this year. Gastroesophageal reflux disease, unspecified whether esophagitis present From 04/28/2019-Return to clinic 1 month after trying omeprazole daily.   Patient completed omeprazole.  Pulmonary emphysema, unspecified emphysema type ([place]) From 04/28/2019-Started Breztri Aerosphere.  Breathing is fine now.  This was started because I saw her pursing her lips for a good bit of the visit last time she was here.  She realized afterwards that she does this as a breathing exercise when she is in pain with her back.  Patient discontinued use of inhaler due to heart irregularities.      Medications: Outpatient Medications Prior to Visit  Medication Sig  . Acetaminophen (TYLENOL ARTHRITIS PAIN PO) Take by mouth as needed (pain).   . Cholecalciferol (VITAMIN D3) 3000 units TABS Take by mouth daily.   . diazepam (VALIUM) 5 MG tablet Take 1 tablet (5 mg total) by mouth at bedtime as needed for anxiety.  Marland Kitchen losartan (COZAAR) 50 MG tablet TAKE 1 TABLET EVERY DAY  . rosuvastatin (CRESTOR) 5 MG tablet Take 1 tablet (5 mg total) by mouth daily.  Marland Kitchen triamterene-hydrochlorothiazide (DYAZIDE) 37.5-25 MG capsule TAKE 1 CAPSULE EVERY DAY  . venlafaxine XR (EFFEXOR XR) 75 MG 24 hr capsule Take 3 capsules (225 mg total) by mouth daily with breakfast.  . doxycycline (VIBRA-TABS) 100 MG tablet Take 1 tablet (100 mg total) by mouth 2 (two) times daily. (Patient not taking: Reported on 02/16/2018)  . methylPREDNISolone (MEDROL DOSEPAK) 4 MG TBPK tablet Use as directed (Patient not taking: Reported on 04/28/2019)    . omeprazole (PRILOSEC) 20 MG capsule Take 1 capsule (20 mg total) by mouth daily. (Patient not taking: Reported on 06/02/2019)  . oxyCODONE (ROXICODONE) 5 MG immediate release tablet Take 0.5-1 tablets (2.5-5 mg total) by mouth every 6 (six) hours as needed for severe pain. (Patient not taking: Reported on 04/28/2019)   No facility-administered medications prior to visit.    Review of Systems  Constitutional: Negative for appetite change, chills, fatigue and fever.  HENT: Negative.   Eyes: Negative.   Respiratory: Negative for chest tightness and shortness of breath.   Cardiovascular: Negative for chest pain and palpitations.  Gastrointestinal: Negative for abdominal pain, nausea and vomiting.  Endocrine: Negative.   Musculoskeletal: Positive for back pain.  Allergic/Immunologic: Negative.   Neurological: Negative for dizziness and weakness.  Psychiatric/Behavioral: Negative.         Objective    BP (!) 145/85 (BP Location: Left Arm, Patient Position: Sitting, Cuff Size: Large)   Pulse 93   Temp (!) 96.8 F (36 C) (Other (Comment))   Resp 18   Ht 5\' 3"  (1.6 m)   Wt 229 lb (103.9 kg)   SpO2 96%   BMI 40.57 kg/m  BP Readings from Last 3 Encounters:  06/02/19 (!) 145/85  04/28/19 (!) 143/84  01/21/19 127/63   Wt Readings from Last 3 Encounters:  06/02/19 229 lb (103.9 kg)  04/28/19 226 lb (102.5 kg)  02/25/19 222 lb (100.7 kg)      Physical Exam Vitals reviewed. Exam conducted with  a chaperone present.  Constitutional:      Appearance: Normal appearance. She is well-developed.  HENT:     Head: Normocephalic and atraumatic.     Right Ear: External ear normal.     Left Ear: External ear normal.     Mouth/Throat:     Pharynx: Oropharynx is clear.  Eyes:     General: No scleral icterus.    Conjunctiva/sclera: Conjunctivae normal.     Pupils: Pupils are equal, round, and reactive to light.  Neck:     Vascular: No carotid bruit.  Cardiovascular:     Rate and  Rhythm: Normal rate and regular rhythm.     Pulses: Normal pulses.     Heart sounds: Normal heart sounds.  Pulmonary:     Effort: Pulmonary effort is normal.     Breath sounds: Normal breath sounds.  Chest:     Breasts:        Right: No mass.        Left: No mass.  Abdominal:     Palpations: Abdomen is soft.  Musculoskeletal:        General: Normal range of motion.     Cervical back: Normal range of motion and neck supple.  Lymphadenopathy:     Cervical: No cervical adenopathy.  Skin:    General: Skin is warm and dry.     Comments: Very fair skin.  Neurological:     General: No focal deficit present.     Mental Status: She is alert and oriented to person, place, and time.     Deep Tendon Reflexes: Reflexes are normal and symmetric.  Psychiatric:        Mood and Affect: Mood normal.        Behavior: Behavior normal.        Thought Content: Thought content normal.        Judgment: Judgment normal.       No results found for any visits on 06/02/19.  Assessment & Plan     1. Essential (primary) hypertension Increase losartan 100 mg daily.  Refill Dyazide.  Follow-up 2 months  2. Allergic rhinitis, unspecified seasonality, unspecified trigger Stop her inhaler as she is breathing fine.  Spirometry  when we are able after Covid  3. Obstructive sleep apnea syndrome   4. Actinic keratosis   5. Osteoarthritis, unspecified osteoarthritis type, unspecified site Patient in chronic pain 6. Personal history of tobacco use, presenting hazards to health   7. Mild episode of recurrent major depressive disorder [facility]) Patient tearful today but it is appropriate morning.  I think she is an decent control for her depression and anxiety  8. Class 2 severe obesity due to excess calories with serious comorbidity and body mass index (BMI) of 39.0 to 39.9 in adult [facility]) Diet and exercise discussed.  No follow-ups on file.         Sheran Newstrom Cranford Mon, MD  [facility] [phone] (phone) [phone] (fax)  [place]

## 2019-06-02 ENCOUNTER — Other Ambulatory Visit: Payer: Self-pay

## 2019-06-02 ENCOUNTER — Ambulatory Visit: Payer: Medicare PPO | Admitting: Family Medicine

## 2019-06-02 ENCOUNTER — Encounter: Payer: Self-pay | Admitting: Family Medicine

## 2019-06-02 VITALS — BP 145/85 | HR 93 | Temp 96.8°F | Resp 18 | Ht 63.0 in | Wt 229.0 lb

## 2019-06-02 DIAGNOSIS — Z6839 Body mass index (BMI) 39.0-39.9, adult: Secondary | ICD-10-CM | POA: Diagnosis not present

## 2019-06-02 DIAGNOSIS — Z87891 Personal history of nicotine dependence: Secondary | ICD-10-CM

## 2019-06-02 DIAGNOSIS — I10 Essential (primary) hypertension: Secondary | ICD-10-CM

## 2019-06-02 DIAGNOSIS — L570 Actinic keratosis: Secondary | ICD-10-CM

## 2019-06-02 DIAGNOSIS — J309 Allergic rhinitis, unspecified: Secondary | ICD-10-CM

## 2019-06-02 DIAGNOSIS — F330 Major depressive disorder, recurrent, mild: Secondary | ICD-10-CM

## 2019-06-02 DIAGNOSIS — M1990 Unspecified osteoarthritis, unspecified site: Secondary | ICD-10-CM | POA: Diagnosis not present

## 2019-06-02 DIAGNOSIS — G4733 Obstructive sleep apnea (adult) (pediatric): Secondary | ICD-10-CM | POA: Diagnosis not present

## 2019-06-02 MED ORDER — LOSARTAN POTASSIUM 100 MG PO TABS: 50.0000 mg | ORAL_TABLET | Freq: Every day | ORAL | 4 refills | Status: DC

## 2019-06-02 MED ORDER — TRIAMTERENE-HCTZ 37.5-25 MG PO CAPS: 1.0000 | ORAL_CAPSULE | Freq: Every day | ORAL | 4 refills | Status: DC

## 2019-06-13 DIAGNOSIS — M25511 Pain in right shoulder: Secondary | ICD-10-CM | POA: Diagnosis not present

## 2019-06-17 ENCOUNTER — Other Ambulatory Visit: Payer: Self-pay | Admitting: Orthopedic Surgery

## 2019-06-17 DIAGNOSIS — M25511 Pain in right shoulder: Secondary | ICD-10-CM

## 2019-06-24 ENCOUNTER — Ambulatory Visit
Admission: RE | Admit: 2019-06-24 | Discharge: 2019-06-24 | Disposition: A | Discharge status: Home / Self Care | Payer: Medicare PPO | Source: Ambulatory Visit | Attending: Orthopedic Surgery | Admitting: Orthopedic Surgery

## 2019-06-24 ENCOUNTER — Other Ambulatory Visit: Payer: Self-pay

## 2019-06-24 DIAGNOSIS — M25511 Pain in right shoulder: Secondary | ICD-10-CM | POA: Diagnosis not present

## 2019-06-28 DIAGNOSIS — M25511 Pain in right shoulder: Secondary | ICD-10-CM | POA: Diagnosis not present

## 2019-07-26 ENCOUNTER — Other Ambulatory Visit: Payer: Self-pay | Admitting: Family Medicine

## 2019-07-26 DIAGNOSIS — I10 Essential (primary) hypertension: Secondary | ICD-10-CM

## 2019-07-26 MED ORDER — LOSARTAN POTASSIUM 100 MG PO TABS: 50.0000 mg | ORAL_TABLET | Freq: Every day | ORAL | 4 refills | Status: DC

## 2019-07-26 NOTE — Telephone Encounter (Signed)
Refill sent to pharmacy.   

## 2019-07-26 NOTE — Telephone Encounter (Signed)
[facility] faxed refill request for the following medications:  losartan (COZAAR) 100 MG tablet  Please advise.  Thanks, [employer]

## 2019-08-01 NOTE — Progress Notes (Signed)
Established patient visit  I,[name],acting as a scribe for [name], MD.,have documented all relevant documentation on the behalf of [name], MD,as directed by  [name], MD while in the presence of [name], MD.   Patient: Victoria Dean   DOB: Sep 25, 1950   69 y.o. Female  MRN: [id number] Visit Date: 08/03/2019  Today's healthcare provider: [name], MD   Chief Complaint  Patient presents with  . Follow-up  . Hypertension   Subjective    HPI Patient becoming less steady.  Due to chronic back pain she has trouble walking and staying active.  She feels weak in her right leg.  She has trouble getting up from the floor. She did quit smoking 16 months ago. Hypertension, follow-up  BP Readings from Last 3 Encounters:  08/03/19 (!) 142/84  06/02/19 (!) 145/85  04/28/19 (!) 143/84   Wt Readings from Last 3 Encounters:  08/03/19 232 lb (105.2 kg)  06/02/19 229 lb (103.9 kg)  04/28/19 226 lb (102.5 kg)     She was last seen for hypertension 2 months ago.  BP at that visit was 145/85. Management since that visit includes; Increased losartan 100 mg daily.  She reports good compliance with treatment. She is not having side effects. none She is not exercising. She is adherent to low salt diet.   Outside blood pressures are 156/86-111/75.  She does not smoke.  Use of agents associated with hypertension: none.   -------------------------------------------------------------------- Patient states she is doing well on increased dose of losartan.   Osteoarthritis, unspecified osteoarthritis type, unspecified site From 06/02/2019-Patient in chronic pain.  Mild episode of recurrent major depressive disorder ([place]) From 06/02/2019-Patient tearful today but it is appropriate morning.  I think she is an decent control for her depression and anxiety.  Class 2 severe obesity due to excess calories with serious comorbidity and  body mass index (BMI) of 39.0 to 39.9 in adult [facility]) From 06/02/2019-Diet and exercise discussed.      Medications: Outpatient Medications Prior to Visit  Medication Sig  . Acetaminophen (TYLENOL ARTHRITIS PAIN PO) Take by mouth as needed (pain).   . Cholecalciferol (VITAMIN D3) 3000 units TABS Take by mouth daily.   . diazepam (VALIUM) 5 MG tablet Take 1 tablet (5 mg total) by mouth at bedtime as needed for anxiety.  Marland Kitchen oxyCODONE (ROXICODONE) 5 MG immediate release tablet Take 0.5-1 tablets (2.5-5 mg total) by mouth every 6 (six) hours as needed for severe pain.  Marland Kitchen triamterene-hydrochlorothiazide (DYAZIDE) 37.5-25 MG capsule Take 1 each (1 capsule total) by mouth daily.  Marland Kitchen venlafaxine XR (EFFEXOR XR) 75 MG 24 hr capsule Take 3 capsules (225 mg total) by mouth daily with breakfast.  . [DISCONTINUED] losartan (COZAAR) 100 MG tablet Take 0.5 tablets (50 mg total) by mouth daily. (Patient taking differently: Take 100 mg by mouth daily. )  . [DISCONTINUED] rosuvastatin (CRESTOR) 5 MG tablet Take 1 tablet (5 mg total) by mouth daily.  Marland Kitchen doxycycline (VIBRA-TABS) 100 MG tablet Take 1 tablet (100 mg total) by mouth 2 (two) times daily. (Patient not taking: Reported on 02/16/2018)  . methylPREDNISolone (MEDROL DOSEPAK) 4 MG TBPK tablet Use as directed (Patient not taking: Reported on 04/28/2019)  . omeprazole (PRILOSEC) 20 MG capsule Take 1 capsule (20 mg total) by mouth daily. (Patient not taking: Reported on 06/02/2019)   No facility-administered medications prior to visit.    Review of Systems  Constitutional: Negative for appetite  change, chills, fatigue and fever.  Eyes: Negative.   Respiratory: Negative for chest tightness and shortness of breath.   Cardiovascular: Negative for chest pain and palpitations.  Gastrointestinal: Negative for abdominal pain, nausea and vomiting.  Musculoskeletal: Positive for back pain.  Allergic/Immunologic: Negative.   Neurological: Positive for weakness.  Negative for dizziness.  Hematological: Negative.   Psychiatric/Behavioral: Negative.        Objective    BP (!) 142/84 (BP Location: Left Arm, Patient Position: Sitting, Cuff Size: Large)   Pulse 89   Temp (!) 97.1 F (36.2 C) (Other (Comment))   Resp 18   Ht 5\' 3"  (1.6 m)   Wt 232 lb (105.2 kg)   SpO2 95%   BMI 41.10 kg/m  Wt Readings from Last 3 Encounters:  08/03/19 232 lb (105.2 kg)  06/02/19 229 lb (103.9 kg)  04/28/19 226 lb (102.5 kg)      Physical Exam Vitals reviewed. Exam conducted with a chaperone present.  Constitutional:      Appearance: Normal appearance. She is well-developed.  HENT:     Head: Normocephalic and atraumatic.     Right Ear: External ear normal.     Left Ear: External ear normal.     Mouth/Throat:     Pharynx: Oropharynx is clear.  Eyes:     General: No scleral icterus.    Conjunctiva/sclera: Conjunctivae normal.     Pupils: Pupils are equal, round, and reactive to light.  Neck:     Vascular: No carotid bruit.  Cardiovascular:     Rate and Rhythm: Normal rate and regular rhythm.     Pulses: Normal pulses.     Heart sounds: Normal heart sounds.  Pulmonary:     Effort: Pulmonary effort is normal.     Breath sounds: Normal breath sounds.  Chest:     Breasts:        Right: No mass.        Left: No mass.  Abdominal:     Palpations: Abdomen is soft.  Musculoskeletal:        General: Normal range of motion.     Cervical back: Normal range of motion and neck supple.  Lymphadenopathy:     Cervical: No cervical adenopathy.  Skin:    General: Skin is warm and dry.     Comments: Very fair skin.  Neurological:     General: No focal deficit present.     Mental Status: She is alert and oriented to person, place, and time.     Deep Tendon Reflexes: Reflexes are normal and symmetric.  Psychiatric:        Mood and Affect: Mood normal.        Behavior: Behavior normal.        Thought Content: Thought content normal.        Judgment:  Judgment normal.       No results found for any visits on 08/03/19.  Assessment & Plan     1. Essential (primary) hypertension Increased losartan from 50 mg to 100 mg daily.  - losartan (COZAAR) 100 MG tablet; Take 1 tablet (100 mg total) by mouth daily.  Dispense: 90 tablet; Refill: 4  2. ASCVD (arteriosclerotic cardiovascular disease)  - rosuvastatin (CRESTOR) 5 MG tablet; Take 1 tablet (5 mg total) by mouth daily.  Dispense: 90 tablet; Refill:  3. Obstructive sleep apnea syndrome   4. History of lumbar laminectomy Patient has fall risk due to chronic pain and weakness.  She is  to check on life alert.  5. Recurrent major depressive disorder, in partial remission ([place]) On venlafaxine.  6. Personal history of tobacco use, presenting hazards to health Patient has quit.  Gets yearly chest CT   Return in about 3 months (around 11/03/2019).      I, [name], MD, have reviewed all documentation for this visit. The documentation on 08/12/19 for the exam, diagnosis, procedures, and orders are all accurate and complete.    Kyl Givler Cranford Mon, MD  [facility] [phone] (phone) [phone] (fax)  [place]

## 2019-08-03 ENCOUNTER — Encounter: Payer: Self-pay | Admitting: Family Medicine

## 2019-08-03 ENCOUNTER — Other Ambulatory Visit: Payer: Self-pay

## 2019-08-03 ENCOUNTER — Ambulatory Visit: Payer: Medicare PPO | Admitting: Family Medicine

## 2019-08-03 VITALS — BP 142/84 | HR 89 | Temp 97.1°F | Resp 18 | Ht 63.0 in | Wt 232.0 lb

## 2019-08-03 DIAGNOSIS — I10 Essential (primary) hypertension: Secondary | ICD-10-CM | POA: Diagnosis not present

## 2019-08-03 DIAGNOSIS — I2510 Atherosclerotic heart disease of native coronary artery without angina pectoris: Secondary | ICD-10-CM | POA: Diagnosis not present

## 2019-08-03 DIAGNOSIS — Z87891 Personal history of nicotine dependence: Secondary | ICD-10-CM | POA: Diagnosis not present

## 2019-08-03 DIAGNOSIS — G4733 Obstructive sleep apnea (adult) (pediatric): Secondary | ICD-10-CM

## 2019-08-03 DIAGNOSIS — F3341 Major depressive disorder, recurrent, in partial remission: Secondary | ICD-10-CM

## 2019-08-03 DIAGNOSIS — Z9889 Other specified postprocedural states: Secondary | ICD-10-CM | POA: Diagnosis not present

## 2019-08-03 MED ORDER — LOSARTAN POTASSIUM 100 MG PO TABS: 100.0000 mg | ORAL_TABLET | Freq: Every day | ORAL | 4 refills | Status: DC

## 2019-08-03 MED ORDER — ROSUVASTATIN CALCIUM 5 MG PO TABS: 5.0000 mg | ORAL_TABLET | Freq: Every day | ORAL | 3 refills | Status: DC

## 2019-08-03 NOTE — Patient Instructions (Addendum)
Call [facility] for Life Alert information.  Try Lactaid tablet before consuming any dairy products.

## 2019-08-12 NOTE — Assessment & Plan Note (Signed)
Patient quit smoking February 2019 February 2019

## 2019-10-14 NOTE — Progress Notes (Signed)
I,Victoria Dean,acting as a scribe for Victoria Durie, MD.,have documented all relevant documentation on the behalf of Victoria Durie, MD,as directed by  Victoria Durie, MD while in the presence of Victoria Durie, MD.   Established patient visit   Patient: Victoria Dean   DOB: April 21, 1950   69 y.o. Female  MRN: [id number] Visit Date: 10/17/2019  Today's healthcare provider: [name], MD   Chief Complaint  Patient presents with  . Follow-up  . Hypertension   Subjective    HPI  Overall pt feels ok. Hypertension, follow-up  BP Readings from Last 3 Encounters:  10/17/19 105/72  08/03/19 (!) 142/84  06/02/19 (!) 145/85   Wt Readings from Last 3 Encounters:  10/17/19 231 lb (104.8 kg)  08/03/19 232 lb (105.2 kg)  06/02/19 229 lb (103.9 kg)     She was last seen for hypertension 2 months ago.  BP at that visit was 142/84. Management since that visit includes; Increased losartan from 50 mg to 100 mg daily.  She reports good compliance with treatment. She is not having side effects. none She is not exercising. She is not adherent to low salt diet.   Outside blood pressures are 115/75-130/85.  She does not smoke.  Use of agents associated with hypertension: none.   --------------------------------------------------------------------       Medications: Outpatient Medications Prior to Visit  Medication Sig  . Acetaminophen (TYLENOL ARTHRITIS PAIN PO) Take by mouth as needed (pain).   . Cholecalciferol (VITAMIN D3) 3000 units TABS Take by mouth daily.   . diazepam (VALIUM) 5 MG tablet Take 1 tablet (5 mg total) by mouth at bedtime as needed for anxiety.  Marland Kitchen losartan (COZAAR) 100 MG tablet Take 1 tablet (100 mg total) by mouth daily.  Marland Kitchen oxyCODONE (ROXICODONE) 5 MG immediate release tablet Take 0.5-1 tablets (2.5-5 mg total) by mouth every 6 (six) hours as needed for severe pain.  . rosuvastatin (CRESTOR) 5 MG tablet Take 1 tablet (5 mg total) by  mouth daily.  Marland Kitchen triamterene-hydrochlorothiazide (DYAZIDE) 37.5-25 MG capsule Take 1 each (1 capsule total) by mouth daily.  Marland Kitchen venlafaxine XR (EFFEXOR XR) 75 MG 24 hr capsule Take 3 capsules (225 mg total) by mouth daily with breakfast.  . doxycycline (VIBRA-TABS) 100 MG tablet Take 1 tablet (100 mg total) by mouth 2 (two) times daily. (Patient not taking: Reported on 02/16/2018)  . methylPREDNISolone (MEDROL DOSEPAK) 4 MG TBPK tablet Use as directed (Patient not taking: Reported on 04/28/2019)  . [DISCONTINUED] omeprazole (PRILOSEC) 20 MG capsule Take 1 capsule (20 mg total) by mouth daily. (Patient not taking: Reported on 06/02/2019)   No facility-administered medications prior to visit.    Review of Systems  Constitutional: Negative for appetite change, chills, fatigue and fever.  Respiratory: Negative for chest tightness and shortness of breath.   Cardiovascular: Negative for chest pain and palpitations.  Gastrointestinal: Negative for abdominal pain, nausea and vomiting.  Neurological: Negative for dizziness and weakness.       Objective    BP 105/72 (BP Location: Right Arm, Patient Position: Sitting, Cuff Size: Large)   Pulse 98   Temp 99.3 F (37.4 C) (Oral)   Resp 18   Ht 5\' 3"  (1.6 m)   Wt 231 lb (104.8 kg)   SpO2 95%   BMI 40.92 kg/m  BP Readings from Last 3 Encounters:  10/17/19 105/72  08/03/19 (!) 142/84  06/02/19 (!) 145/85   Wt Readings from Last 3  Encounters:  10/17/19 231 lb (104.8 kg)  08/03/19 232 lb (105.2 kg)  06/02/19 229 lb (103.9 kg)      Physical Exam Vitals reviewed. Exam conducted with a chaperone present.  Constitutional:      Appearance: Normal appearance. She is well-developed.  HENT:     Head: Normocephalic and atraumatic.     Right Ear: External ear normal.     Left Ear: External ear normal.     Mouth/Throat:     Pharynx: Oropharynx is clear.  Eyes:     General: No scleral icterus.    Conjunctiva/sclera: Conjunctivae normal.      Pupils: Pupils are equal, round, and reactive to light.  Neck:     Vascular: No carotid bruit.  Cardiovascular:     Rate and Rhythm: Normal rate and regular rhythm.     Pulses: Normal pulses.     Heart sounds: Normal heart sounds.  Pulmonary:     Effort: Pulmonary effort is normal.     Breath sounds: Normal breath sounds.  Chest:     Breasts:        Right: No mass.        Left: No mass.  Abdominal:     Palpations: Abdomen is soft.  Musculoskeletal:     Cervical back: Normal range of motion and neck supple.  Lymphadenopathy:     Cervical: No cervical adenopathy.  Skin:    General: Skin is warm and dry.     Comments: Very fair skin.  Neurological:     General: No focal deficit present.     Mental Status: She is alert and oriented to person, place, and time.     Deep Tendon Reflexes: Reflexes are normal and symmetric.  Psychiatric:        Mood and Affect: Mood normal.        Behavior: Behavior normal.        Thought Content: Thought content normal.        Judgment: Judgment normal.       No results found for any visits on 10/17/19.  Assessment & Plan     1. Essential (primary) hypertension   2. Need for influenza vaccination  - Flu Vaccine QUAD High Dose(Fluad)  3. Obstructive sleep apnea syndrome   4. Personal history of tobacco use, presenting hazards to health Pt quit smoking.  5. Mild episode of recurrent major depressive disorder ([place])   6. History of lumbar laminectomy    No follow-ups on file.     I, Victoria Durie, MD, have reviewed all documentation for this visit. The documentation on 10/22/19 for the exam, diagnosis, procedures, and orders are all accurate and complete.    Makeba Delcastillo Cranford Mon, MD  [facility] [phone] (phone) [phone] (fax)  [place]

## 2019-10-17 ENCOUNTER — Ambulatory Visit: Payer: Medicare PPO | Admitting: Family Medicine

## 2019-10-17 ENCOUNTER — Other Ambulatory Visit: Payer: Self-pay

## 2019-10-17 ENCOUNTER — Encounter: Payer: Self-pay | Admitting: Family Medicine

## 2019-10-17 VITALS — BP 105/72 | HR 98 | Temp 99.3°F | Resp 18 | Ht 63.0 in | Wt 231.0 lb

## 2019-10-17 DIAGNOSIS — F330 Major depressive disorder, recurrent, mild: Secondary | ICD-10-CM | POA: Diagnosis not present

## 2019-10-17 DIAGNOSIS — I10 Essential (primary) hypertension: Secondary | ICD-10-CM | POA: Diagnosis not present

## 2019-10-17 DIAGNOSIS — Z9889 Other specified postprocedural states: Secondary | ICD-10-CM | POA: Diagnosis not present

## 2019-10-17 DIAGNOSIS — Z23 Encounter for immunization: Secondary | ICD-10-CM | POA: Diagnosis not present

## 2019-10-17 DIAGNOSIS — G4733 Obstructive sleep apnea (adult) (pediatric): Secondary | ICD-10-CM

## 2019-10-17 DIAGNOSIS — Z87891 Personal history of nicotine dependence: Secondary | ICD-10-CM | POA: Diagnosis not present

## 2019-11-16 DIAGNOSIS — B078 Other viral warts: Secondary | ICD-10-CM | POA: Diagnosis not present

## 2019-11-16 DIAGNOSIS — D485 Neoplasm of uncertain behavior of skin: Secondary | ICD-10-CM | POA: Diagnosis not present

## 2019-11-16 DIAGNOSIS — L821 Other seborrheic keratosis: Secondary | ICD-10-CM | POA: Diagnosis not present

## 2019-11-16 DIAGNOSIS — Z85828 Personal history of other malignant neoplasm of skin: Secondary | ICD-10-CM | POA: Diagnosis not present

## 2019-11-16 DIAGNOSIS — B079 Viral wart, unspecified: Secondary | ICD-10-CM | POA: Diagnosis not present

## 2019-11-16 DIAGNOSIS — R238 Other skin changes: Secondary | ICD-10-CM | POA: Diagnosis not present

## 2019-11-16 DIAGNOSIS — L570 Actinic keratosis: Secondary | ICD-10-CM | POA: Diagnosis not present

## 2019-11-16 DIAGNOSIS — L538 Other specified erythematous conditions: Secondary | ICD-10-CM | POA: Diagnosis not present

## 2019-11-16 DIAGNOSIS — D2262 Melanocytic nevi of left upper limb, including shoulder: Secondary | ICD-10-CM | POA: Diagnosis not present

## 2019-11-16 DIAGNOSIS — D2261 Melanocytic nevi of right upper limb, including shoulder: Secondary | ICD-10-CM | POA: Diagnosis not present

## 2019-11-16 DIAGNOSIS — D225 Melanocytic nevi of trunk: Secondary | ICD-10-CM | POA: Diagnosis not present

## 2019-11-23 DIAGNOSIS — L570 Actinic keratosis: Secondary | ICD-10-CM | POA: Diagnosis not present

## 2019-11-24 ENCOUNTER — Other Ambulatory Visit: Payer: Self-pay | Admitting: Family Medicine

## 2019-11-24 DIAGNOSIS — F411 Generalized anxiety disorder: Secondary | ICD-10-CM

## 2019-11-24 NOTE — Telephone Encounter (Signed)
[facility] faxed refill request for the following medications:  venlafaxine XR (EFFEXOR XR) 75 MG 24 hr capsule  Please advise. Thanks, [employer]

## 2019-11-28 MED ORDER — VENLAFAXINE HCL ER 75 MG PO CP24: 225.0000 mg | ORAL_CAPSULE | Freq: Every day | ORAL | 1 refills | Status: DC

## 2020-01-10 ENCOUNTER — Other Ambulatory Visit: Payer: Self-pay | Admitting: Family Medicine

## 2020-01-10 DIAGNOSIS — I10 Essential (primary) hypertension: Secondary | ICD-10-CM

## 2020-01-10 NOTE — Telephone Encounter (Signed)
[facility] faxed refill request for the following medications:  triamterene-hydrochlorothiazide (DYAZIDE) 37.5-25 MG capsule   Please advise.  Thanks, [employer]

## 2020-01-11 MED ORDER — TRIAMTERENE-HCTZ 37.5-25 MG PO CAPS: 1.0000 | ORAL_CAPSULE | Freq: Every day | ORAL | 4 refills | Status: DC

## 2020-02-21 ENCOUNTER — Ambulatory Visit: Payer: Self-pay | Admitting: Family Medicine

## 2020-02-23 ENCOUNTER — Encounter: Payer: Self-pay | Admitting: Family Medicine

## 2020-02-23 ENCOUNTER — Ambulatory Visit: Payer: Medicare PPO | Admitting: Family Medicine

## 2020-02-23 ENCOUNTER — Other Ambulatory Visit: Payer: Self-pay

## 2020-02-23 VITALS — BP 148/71 | HR 91 | Resp 18 | Ht 63.0 in | Wt 231.0 lb

## 2020-02-23 DIAGNOSIS — E7800 Pure hypercholesterolemia, unspecified: Secondary | ICD-10-CM

## 2020-02-23 DIAGNOSIS — M1990 Unspecified osteoarthritis, unspecified site: Secondary | ICD-10-CM

## 2020-02-23 DIAGNOSIS — Z6839 Body mass index (BMI) 39.0-39.9, adult: Secondary | ICD-10-CM | POA: Diagnosis not present

## 2020-02-23 DIAGNOSIS — I493 Ventricular premature depolarization: Secondary | ICD-10-CM | POA: Diagnosis not present

## 2020-02-23 DIAGNOSIS — Z9889 Other specified postprocedural states: Secondary | ICD-10-CM | POA: Diagnosis not present

## 2020-02-23 DIAGNOSIS — G4733 Obstructive sleep apnea (adult) (pediatric): Secondary | ICD-10-CM | POA: Diagnosis not present

## 2020-02-23 DIAGNOSIS — I10 Essential (primary) hypertension: Secondary | ICD-10-CM | POA: Diagnosis not present

## 2020-02-23 DIAGNOSIS — F330 Major depressive disorder, recurrent, mild: Secondary | ICD-10-CM

## 2020-02-23 NOTE — Progress Notes (Signed)
I,[name],acting as a scribe for [name], MD.,have documented all relevant documentation on the behalf of [name], MD,as directed by  [name], MD while in the presence of [name], MD.   Established patient visit   Patient: Victoria Dean   DOB: 1950/12/07   70 y.o. Female  MRN: [id number] Visit Date: 02/23/2020  Today's healthcare provider: [name], MD   Chief Complaint  Patient presents with  . Follow-up  . Hypertension  . Depression   Subjective    HPI  Patient is overall she is feeling fairly well.  Pressures at home are in the 110/70 range. Hypertension, follow-up  BP Readings from Last 3 Encounters:  02/23/20 (!) 148/71  10/17/19 105/72  08/03/19 (!) 142/84   Wt Readings from Last 3 Encounters:  02/23/20 231 lb (104.8 kg)  10/17/19 231 lb (104.8 kg)  08/03/19 232 lb (105.2 kg)     She was last seen for hypertension 4 months ago.  BP at that visit was 105/72. Management since that visit includes; on losartan and triamterene-hydrochlorothiazide.  She reports good compliance with treatment. She is not having side effects. none She is not exercising. She is adherent to low salt diet.   Outside blood pressures are 112/78.  She does not smoke.  Use of agents associated with hypertension: none.   --------------------------------------------------------------------  Depression, Follow-up  She  was last seen for this 4 months ago. Changes made at last visit include; on Effexor.   She reports good compliance with treatment. She is not having side effects. none  She reports good tolerance of treatment. Current symptoms include: depressed mood and fatigue She feels she is Unchanged since last visit.  Depression screen [facility] 2/9 04/28/2019 12/02/2018 02/16/2018  Decreased Interest 1 0 0  Down, Depressed, Hopeless 1 1 0  PHQ - 2 Score 2 1 0  Altered sleeping 1 0 -  Tired, decreased energy 1 2 -   Change in appetite 0 3 -  Feeling bad or failure about yourself  0 0 -  Trouble concentrating 0 0 -  Moving slowly or fidgety/restless 0 0 -  Suicidal thoughts 0 0 -  PHQ-9 Score 4 6 -  Difficult doing work/chores Not difficult at all Somewhat difficult -    --------------------------------------------------------------------       Medications: Outpatient Medications Prior to Visit  Medication Sig  . Acetaminophen (TYLENOL ARTHRITIS PAIN PO) Take by mouth as needed (pain).   . Cholecalciferol (VITAMIN D3) 3000 units TABS Take by mouth daily.   . diazepam (VALIUM) 5 MG tablet Take 1 tablet (5 mg total) by mouth at bedtime as needed for anxiety.  Marland Kitchen losartan (COZAAR) 100 MG tablet Take 1 tablet (100 mg total) by mouth daily.  . rosuvastatin (CRESTOR) 5 MG tablet Take 1 tablet (5 mg total) by mouth daily.  Marland Kitchen triamterene-hydrochlorothiazide (DYAZIDE) 37.5-25 MG capsule Take 1 each (1 capsule total) by mouth daily.  Marland Kitchen venlafaxine XR (EFFEXOR XR) 75 MG 24 hr capsule Take 3 capsules (225 mg total) by mouth daily with breakfast.  . doxycycline (VIBRA-TABS) 100 MG tablet Take 1 tablet (100 mg total) by mouth 2 (two) times daily. (Patient not taking: No sig reported)  . methylPREDNISolone (MEDROL DOSEPAK) 4 MG TBPK tablet Use as directed (Patient not taking: No sig reported)  . oxyCODONE (ROXICODONE) 5 MG immediate release tablet Take 0.5-1 tablets (2.5-5 mg total) by mouth every 6 (six) hours as needed  for severe pain. (Patient not taking: Reported on 02/23/2020)   No facility-administered medications prior to visit.    Review of Systems  Constitutional: Negative for appetite change, chills, fatigue and fever.  Respiratory: Negative for chest tightness and shortness of breath.   Cardiovascular: Negative for chest pain and palpitations.  Gastrointestinal: Negative for abdominal pain, nausea and vomiting.  Neurological: Negative for dizziness and weakness.        Objective    BP (!)  148/71 (BP Location: Right Arm, Patient Position: Sitting, Cuff Size: Large)   Pulse 91   Resp 18   Ht 5\' 3"  (1.6 m)   Wt 231 lb (104.8 kg)   SpO2 97%   BMI 40.92 kg/m  BP Readings from Last 3 Encounters:  02/23/20 (!) 148/71  10/17/19 105/72  08/03/19 (!) 142/84   Wt Readings from Last 3 Encounters:  02/23/20 231 lb (104.8 kg)  10/17/19 231 lb (104.8 kg)  08/03/19 232 lb (105.2 kg)       Physical Exam Vitals reviewed. Exam conducted with a chaperone present.  Constitutional:      Appearance: Normal appearance. She is well-developed.  HENT:     Head: Normocephalic and atraumatic.     Right Ear: External ear normal.     Left Ear: External ear normal.     Mouth/Throat:     Pharynx: Oropharynx is clear.  Eyes:     General: No scleral icterus.    Conjunctiva/sclera: Conjunctivae normal.     Pupils: Pupils are equal, round, and reactive to light.  Neck:     Vascular: No carotid bruit.  Cardiovascular:     Rate and Rhythm: Normal rate and regular rhythm.     Pulses: Normal pulses.     Heart sounds: Normal heart sounds.  Pulmonary:     Effort: Pulmonary effort is normal.     Breath sounds: Normal breath sounds.  Chest:  Breasts:     Right: No mass.     Left: No mass.    Abdominal:     Palpations: Abdomen is soft.  Musculoskeletal:     Cervical back: Normal range of motion and neck supple.  Lymphadenopathy:     Cervical: No cervical adenopathy.  Skin:    General: Skin is warm and dry.     Comments: Very fair skin.  Neurological:     General: No focal deficit present.     Mental Status: She is alert and oriented to person, place, and time.     Deep Tendon Reflexes: Reflexes are normal and symmetric.  Psychiatric:        Mood and Affect: Mood normal.        Behavior: Behavior normal.        Thought Content: Thought content normal.        Judgment: Judgment normal.       No results found for any visits on 02/23/20.  Assessment & Plan     1. Essential  (primary) hypertension [place] hypertension  with good control at home. - CBC w/Diff/Platelet - Comprehensive Metabolic Panel (CMET) - TSH - Lipid panel  2. PVC (premature ventricular contraction) Abl - CBC w/Diff/Platelet - Comprehensive Metabolic Panel (CMET) - TSH - Lipid panel  3. Pure hypercholesterolemia Rosuvastatin - CBC w/Diff/Platelet - Comprehensive Metabolic Panel (CMET) - TSH - Lipid panel  4. Obstructive sleep apnea syndrome   5. Osteoarthritis, unspecified osteoarthritis type, unspecified site   6. Class 2 severe obesity due to excess calories with serious  comorbidity and body mass index (BMI) of 39.0 to 39.9 in adult [facility]) Had an exercise stress.  7. Mild episode of recurrent major depressive disorder ([facility]) On venlafaxine  8. History of lumbar laminectomy Chronic back pain.   No follow-ups on file.      I, [name], MD, have reviewed all documentation for this visit. The documentation on 03/02/20 for the exam, diagnosis, procedures, and orders are all accurate and complete.    Israel Wunder Cranford Mon, MD  [facility] [phone] (phone) [phone] (fax)  [place]

## 2020-02-24 LAB — COMPREHENSIVE METABOLIC PANEL WITH GFR
ALT: 21 IU/L (ref 0–32)
AST: 19 IU/L (ref 0–40)
Albumin/Globulin Ratio: 2 (ref 1.2–2.2)
Albumin: 4.5 g/dL (ref 3.7–4.7)
Alkaline Phosphatase: 166 IU/L — ABNORMAL HIGH (ref 44–121)
BUN/Creatinine Ratio: 16 (ref 12–28)
BUN: 21 mg/dL (ref 8–27)
Bilirubin Total: 0.3 mg/dL (ref 0.0–1.2)
CO2: 28 mmol/L (ref 20–29)
Calcium: 9.6 mg/dL (ref 8.7–10.3)
Chloride: 101 mmol/L (ref 96–106)
Creatinine, Ser: 1.33 mg/dL — ABNORMAL HIGH (ref 0.57–1.00)
GFR calc Af Amer: 46 mL/min/1.73 — ABNORMAL LOW (ref >59)
GFR calc non Af Amer: 40 mL/min/1.73 — ABNORMAL LOW (ref >59)
Globulin, Total: 2.3 g/dL (ref 1.5–4.5)
Glucose: 90 mg/dL (ref 65–99)
Potassium: 4.1 mmol/L (ref 3.5–5.2)
Sodium: 141 mmol/L (ref 134–144)
Total Protein: 6.8 g/dL (ref 6.0–8.5)

## 2020-02-24 LAB — CBC WITH DIFFERENTIAL/PLATELET
Basophils Absolute: 0.1 10*3/uL (ref 0.0–0.2)
Basos: 1 %
EOS (ABSOLUTE): 0.1 10*3/uL (ref 0.0–0.4)
Eos: 2 %
Hematocrit: 37.5 % (ref 34.0–46.6)
Hemoglobin: 11.9 g/dL (ref 11.1–15.9)
Immature Grans (Abs): 0 10*3/uL (ref 0.0–0.1)
Immature Granulocytes: 0 %
Lymphocytes Absolute: 3 10*3/uL (ref 0.7–3.1)
Lymphs: 34 %
MCH: 29 pg (ref 26.6–33.0)
MCHC: 31.7 g/dL (ref 31.5–35.7)
MCV: 91 fL (ref 79–97)
Monocytes Absolute: 0.8 10*3/uL (ref 0.1–0.9)
Monocytes: 9 %
Neutrophils Absolute: 4.9 10*3/uL (ref 1.4–7.0)
Neutrophils: 54 %
Platelets: 274 10*3/uL (ref 150–450)
RBC: 4.11 x10E6/uL (ref 3.77–5.28)
RDW: 13.1 % (ref 11.7–15.4)
WBC: 8.9 10*3/uL (ref 3.4–10.8)

## 2020-02-24 LAB — LIPID PANEL
Chol/HDL Ratio: 2.8 ratio (ref 0.0–4.4)
Cholesterol, Total: 151 mg/dL (ref 100–199)
HDL: 53 mg/dL (ref >39)
LDL Chol Calc (NIH): 77 mg/dL (ref 0–99)
Triglycerides: 119 mg/dL (ref 0–149)
VLDL Cholesterol Cal: 21 mg/dL (ref 5–40)

## 2020-02-24 LAB — TSH: TSH: 0.814 u[IU]/mL (ref 0.450–4.500)

## 2020-02-29 ENCOUNTER — Other Ambulatory Visit: Payer: Self-pay | Admitting: *Deleted

## 2020-02-29 DIAGNOSIS — Z87891 Personal history of nicotine dependence: Secondary | ICD-10-CM

## 2020-02-29 DIAGNOSIS — Z122 Encounter for screening for malignant neoplasm of respiratory organs: Secondary | ICD-10-CM

## 2020-02-29 NOTE — Progress Notes (Signed)
Contacted and scheduled for annual lung screening scan. Patient is a former smoker, quit 04/19/18, 30 pack year history.

## 2020-03-05 ENCOUNTER — Telehealth: Payer: Self-pay

## 2020-03-05 NOTE — Telephone Encounter (Signed)
Copied from [place] [id number]. Topic: General - Other >> Mar 05, 2020 11:42 AM Leward Quan A wrote: Reason for CRM: Patient would like call back to discuss her lab results can be reached at Ph# [phone]

## 2020-03-05 NOTE — Telephone Encounter (Signed)
Patient was notified of results. Patient has concerns about tsh and Creatinine. Patient also requests provider call her.please advise?

## 2020-03-05 NOTE — Telephone Encounter (Signed)
LMOVM for pt to return call 

## 2020-03-12 ENCOUNTER — Ambulatory Visit: Admission: RE | Admit: 2020-03-12 | Payer: Medicare PPO | Source: Ambulatory Visit

## 2020-03-15 ENCOUNTER — Other Ambulatory Visit: Payer: Self-pay | Admitting: *Deleted

## 2020-03-15 DIAGNOSIS — I10 Essential (primary) hypertension: Secondary | ICD-10-CM

## 2020-03-15 DIAGNOSIS — N189 Chronic kidney disease, unspecified: Secondary | ICD-10-CM

## 2020-03-15 DIAGNOSIS — R7989 Other specified abnormal findings of blood chemistry: Secondary | ICD-10-CM

## 2020-03-15 NOTE — Telephone Encounter (Signed)
Please order c-Met for patient.  Diagnosis hypertension, chronic kidney disease, elevated liver function.  Please put up front and with Arbie Cookey and she will pick it up next week to do labs  a week before her next appointment in the spring

## 2020-03-15 NOTE — Telephone Encounter (Signed)
Labs ordered.

## 2020-03-23 ENCOUNTER — Ambulatory Visit: Admission: RE | Admit: 2020-03-23 | Payer: Medicare PPO | Source: Ambulatory Visit

## 2020-03-27 ENCOUNTER — Other Ambulatory Visit: Payer: Self-pay

## 2020-03-27 ENCOUNTER — Ambulatory Visit
Admission: RE | Admit: 2020-03-27 | Discharge: 2020-03-27 | Disposition: A | Discharge status: Home / Self Care | Payer: Medicare PPO | Source: Ambulatory Visit | Attending: Oncology | Admitting: Oncology

## 2020-03-27 DIAGNOSIS — Z122 Encounter for screening for malignant neoplasm of respiratory organs: Secondary | ICD-10-CM | POA: Insufficient documentation

## 2020-03-27 DIAGNOSIS — Z87891 Personal history of nicotine dependence: Secondary | ICD-10-CM | POA: Diagnosis not present

## 2020-03-30 ENCOUNTER — Encounter: Payer: Self-pay | Admitting: *Deleted

## 2020-04-03 DIAGNOSIS — C50919 Malignant neoplasm of unspecified site of unspecified female breast: Secondary | ICD-10-CM

## 2020-04-03 HISTORY — DX: Malignant neoplasm of unspecified site of unspecified female breast: C50.919

## 2020-05-15 ENCOUNTER — Other Ambulatory Visit: Payer: Self-pay

## 2020-05-15 ENCOUNTER — Encounter: Payer: Self-pay | Admitting: Family Medicine

## 2020-05-15 ENCOUNTER — Ambulatory Visit: Payer: Medicare PPO | Admitting: Family Medicine

## 2020-05-15 VITALS — BP 140/85 | HR 101 | Temp 98.6°F | Wt 235.2 lb

## 2020-05-15 DIAGNOSIS — R922 Inconclusive mammogram: Secondary | ICD-10-CM | POA: Diagnosis not present

## 2020-05-15 DIAGNOSIS — N6459 Other signs and symptoms in breast: Secondary | ICD-10-CM

## 2020-05-15 DIAGNOSIS — N6321 Unspecified lump in the left breast, upper outer quadrant: Secondary | ICD-10-CM

## 2020-05-15 NOTE — Progress Notes (Signed)
Established patient visit   Patient: Victoria Dean   DOB: December 02, 1950   70 y.o. Female  MRN: [id number] Visit Date: 05/15/2020  Today's healthcare provider: [name], MD   Chief Complaint  Patient presents with  . Breast Problem   I,Porsha C McClurkin,acting as a scribe for [name], MD.,have documented all relevant documentation on the behalf of [name], MD,as directed by  [name], MD while in the presence of [name], MD.  Subjective    HPI  Overall patient feeling well but is notice that her left nipple appears to be slightly inverted in the last few weeks.  No drainage or discharge from the nipple. Breast problem Patient reports that her left breast is inverted. She reports noticing it a few weeks ago. She denies any pain.      Medications: Outpatient Medications Prior to Visit  Medication Sig  . Acetaminophen (TYLENOL ARTHRITIS PAIN PO) Take by mouth as needed (pain).   . diazepam (VALIUM) 5 MG tablet Take 1 tablet (5 mg total) by mouth at bedtime as needed for anxiety.  Marland Kitchen losartan (COZAAR) 100 MG tablet Take 1 tablet (100 mg total) by mouth daily.  Marland Kitchen oxyCODONE (ROXICODONE) 5 MG immediate release tablet Take 0.5-1 tablets (2.5-5 mg total) by mouth every 6 (six) hours as needed for severe pain.  . rosuvastatin (CRESTOR) 5 MG tablet Take 1 tablet (5 mg total) by mouth daily.  Marland Kitchen triamterene-hydrochlorothiazide (DYAZIDE) 37.5-25 MG capsule Take 1 each (1 capsule total) by mouth daily.  Marland Kitchen venlafaxine XR (EFFEXOR XR) 75 MG 24 hr capsule Take 3 capsules (225 mg total) by mouth daily with breakfast.  . Cholecalciferol (VITAMIN D3) 3000 units TABS Take by mouth daily.   Marland Kitchen doxycycline (VIBRA-TABS) 100 MG tablet Take 1 tablet (100 mg total) by mouth 2 (two) times daily. (Patient not taking: No sig reported)  . methylPREDNISolone (MEDROL DOSEPAK) 4 MG TBPK tablet Use as directed (Patient not taking: No sig reported)   No  facility-administered medications prior to visit.    Review of Systems  Constitutional: Negative for appetite change, chills, fatigue and fever.  Respiratory: Negative for chest tightness and shortness of breath.   Cardiovascular: Negative for chest pain and palpitations.  Gastrointestinal: Negative for abdominal pain, nausea and vomiting.  Neurological: Negative for dizziness and weakness.        Objective    BP 140/85 (BP Location: Left Arm, Patient Position: Sitting, Cuff Size: Large)   Pulse (!) 101   Temp 98.6 F (37 C) (Oral)   Wt 235 lb 3.2 oz (106.7 kg)   SpO2 98%   BMI 41.66 kg/m  BP Readings from Last 3 Encounters:  05/15/20 140/85  02/23/20 (!) 148/71  10/17/19 105/72   Wt Readings from Last 3 Encounters:  05/15/20 235 lb 3.2 oz (106.7 kg)  03/27/20 231 lb (104.8 kg)  02/23/20 231 lb (104.8 kg)       Physical Exam Vitals reviewed. Exam conducted with a chaperone present.  Constitutional:      Appearance: Normal appearance. She is well-developed.  HENT:     Head: Normocephalic and atraumatic.     Right Ear: External ear normal.     Left Ear: External ear normal.     Mouth/Throat:     Pharynx: Oropharynx is clear.  Eyes:     General: No scleral icterus.    Conjunctiva/sclera: Conjunctivae normal.     Pupils: Pupils are  equal, round, and reactive to light.  Neck:     Vascular: No carotid bruit.  Cardiovascular:     Rate and Rhythm: Normal rate and regular rhythm.     Pulses: Normal pulses.     Heart sounds: Normal heart sounds.  Pulmonary:     Effort: Pulmonary effort is normal.     Breath sounds: Normal breath sounds.  Chest:  Breasts:     Right: Normal. No mass.     Left: Normal. No mass.      Comments: Left nipple slightly inverted. Fullness at 10:00 in the left breast. Abdominal:     Palpations: Abdomen is soft.  Musculoskeletal:     Cervical back: Normal range of motion and neck supple.  Lymphadenopathy:     Cervical: No cervical  adenopathy.  Skin:    General: Skin is warm and dry.     Comments: Very fair skin.  Neurological:     General: No focal deficit present.     Mental Status: She is alert and oriented to person, place, and time.     Deep Tendon Reflexes: Reflexes are normal and symmetric.  Psychiatric:        Mood and Affect: Mood normal.        Behavior: Behavior normal.        Thought Content: Thought content normal.        Judgment: Judgment normal.       No results found for any visits on 05/15/20.  Assessment & Plan       No follow-ups on file.      I, [name], MD, have reviewed all documentation for this visit. The documentation on 05/21/20 for the exam, diagnosis, procedures, and orders are all accurate and complete.    Ariston Grandison Cranford Mon, MD  [facility] [phone] (phone) [phone] (fax)  [place]

## 2020-05-16 ENCOUNTER — Telehealth: Payer: Self-pay

## 2020-05-16 NOTE — Telephone Encounter (Signed)
Copied from [place] [id number]. Topic: Referral - Status >> May 16, 2020  4:31 PM Loma Boston wrote: Reason for CRM: Nurse pls fu with pt at [phone] she is very upset as inverted nipple reason for visit yesterday and Dr Darnell Level had told her he was doing a referral and they would call today to sch the mammo, cannot see referral, telling pt that she will be receiving a cb from nurse to  update as she has been waiting with her phone all day.

## 2020-05-17 ENCOUNTER — Telehealth: Payer: Self-pay | Admitting: Family Medicine

## 2020-05-17 DIAGNOSIS — N6459 Other signs and symptoms in breast: Secondary | ICD-10-CM

## 2020-05-17 DIAGNOSIS — N6321 Unspecified lump in the left breast, upper outer quadrant: Secondary | ICD-10-CM

## 2020-05-17 NOTE — Telephone Encounter (Signed)
Orders were put in and pt has been scheduled

## 2020-05-17 NOTE — Telephone Encounter (Signed)
NA

## 2020-05-17 NOTE — Telephone Encounter (Signed)
Referral coordinator working on scheduling appt.

## 2020-05-17 NOTE — Telephone Encounter (Signed)
Patient advised.

## 2020-05-17 NOTE — Telephone Encounter (Signed)
Per [facility] pt will need an order for limited right and left breast ultrasounds [id number] and [id number]

## 2020-05-22 ENCOUNTER — Ambulatory Visit: Admission: RE | Admit: 2020-05-22 | Payer: Medicare PPO | Source: Ambulatory Visit

## 2020-05-22 ENCOUNTER — Ambulatory Visit
Admission: RE | Admit: 2020-05-22 | Discharge: 2020-05-22 | Disposition: A | Discharge status: Home / Self Care | Payer: Medicare PPO | Source: Ambulatory Visit | Attending: Family Medicine | Admitting: Family Medicine

## 2020-05-22 ENCOUNTER — Other Ambulatory Visit: Payer: Self-pay | Admitting: Family Medicine

## 2020-05-22 ENCOUNTER — Other Ambulatory Visit: Payer: Self-pay

## 2020-05-22 DIAGNOSIS — R922 Inconclusive mammogram: Secondary | ICD-10-CM | POA: Insufficient documentation

## 2020-05-22 DIAGNOSIS — N6459 Other signs and symptoms in breast: Secondary | ICD-10-CM | POA: Insufficient documentation

## 2020-05-22 DIAGNOSIS — N6321 Unspecified lump in the left breast, upper outer quadrant: Secondary | ICD-10-CM | POA: Insufficient documentation

## 2020-05-22 DIAGNOSIS — N6012 Diffuse cystic mastopathy of left breast: Secondary | ICD-10-CM | POA: Diagnosis not present

## 2020-05-22 DIAGNOSIS — R928 Other abnormal and inconclusive findings on diagnostic imaging of breast: Secondary | ICD-10-CM | POA: Diagnosis not present

## 2020-05-23 ENCOUNTER — Other Ambulatory Visit: Payer: Self-pay | Admitting: Family Medicine

## 2020-05-23 DIAGNOSIS — F411 Generalized anxiety disorder: Secondary | ICD-10-CM

## 2020-05-23 NOTE — Telephone Encounter (Signed)
Requested medication (s) are due for refill today:   Provider to review  Requested medication (s) are on the active medication list:   yes  Future visit scheduled:   Yes in 1 mo. With Rosanna Randy   Last ordered: 12/02/2018 #30, 5 refills  Non delegated refill    Requested Prescriptions  Pending Prescriptions Disp Refills   diazepam (VALIUM) 5 MG tablet 30 tablet 5    Sig: Take 1 tablet (5 mg total) by mouth at bedtime as needed for anxiety.      Not Delegated - Psychiatry:  Anxiolytics/Hypnotics Failed - 05/23/2020 11:07 AM      Failed - This refill cannot be delegated      Failed - Urine Drug Screen completed in last 360 days      Passed - Valid encounter within last 6 months    Recent Outpatient Visits           1 week ago Breast lump on left side at 2 o'clock position   [facility] Rosanna Randy, Retia Passe., MD   3 months ago Essential (primary) hypertension   [facility] Jerrol Banana., MD   7 months ago Essential (primary) hypertension   [facility] Jerrol Banana., MD   9 months ago Obstructive sleep apnea syndrome   [facility] Jerrol Banana., MD   11 months ago Essential (primary) hypertension   [facility] Jerrol Banana., MD       Future Appointments             In 1 month Jerrol Banana., MD [facility] [place]

## 2020-05-23 NOTE — Telephone Encounter (Signed)
Medication Refill - Medication: diazepam (VALIUM) 5 MG tablet    Has the patient contacted their pharmacy? No. (Agent: If no, request that the patient contact the pharmacy for the refill.) (Agent: If yes, when and what did the pharmacy advise?)  Preferred Pharmacy (with phone number or street name):  [place], [place] - [address]  [address] [place] [address]  Phone: [phone] Fax: [phone]    Agent: Please be advised that RX refills may take up to 3 business days. We ask that you follow-up with your pharmacy.

## 2020-05-24 MED ORDER — DIAZEPAM 5 MG PO TABS: 5.0000 mg | ORAL_TABLET | Freq: Every evening | ORAL | 5 refills | Status: DC | PRN

## 2020-05-30 ENCOUNTER — Ambulatory Visit
Admission: RE | Admit: 2020-05-30 | Discharge: 2020-05-30 | Disposition: A | Discharge status: Home / Self Care | Payer: Medicare PPO | Source: Ambulatory Visit | Attending: Family Medicine | Admitting: Family Medicine

## 2020-05-30 ENCOUNTER — Other Ambulatory Visit: Payer: Self-pay

## 2020-05-30 DIAGNOSIS — R928 Other abnormal and inconclusive findings on diagnostic imaging of breast: Secondary | ICD-10-CM | POA: Diagnosis not present

## 2020-05-30 DIAGNOSIS — C50412 Malignant neoplasm of upper-outer quadrant of left female breast: Secondary | ICD-10-CM | POA: Diagnosis not present

## 2020-05-30 HISTORY — PX: BREAST BIOPSY: SHX20

## 2020-05-31 ENCOUNTER — Other Ambulatory Visit: Payer: Self-pay

## 2020-05-31 DIAGNOSIS — C50919 Malignant neoplasm of unspecified site of unspecified female breast: Secondary | ICD-10-CM

## 2020-06-04 NOTE — Progress Notes (Signed)
Navigation initiated.  Patient scheduled with Dr. Bary Castilla, and Dr. Janese Banks per her request.

## 2020-06-05 ENCOUNTER — Inpatient Hospital Stay: Payer: Medicare PPO | Admitting: Oncology

## 2020-06-05 ENCOUNTER — Inpatient Hospital Stay: Payer: Medicare PPO

## 2020-06-06 ENCOUNTER — Other Ambulatory Visit: Payer: Self-pay | Admitting: General Surgery

## 2020-06-06 DIAGNOSIS — C50412 Malignant neoplasm of upper-outer quadrant of left female breast: Secondary | ICD-10-CM

## 2020-06-06 NOTE — Addendum Note (Signed)
Addended byHervey Ard on: 06/06/2020 01:26 PM   Modules accepted: Orders

## 2020-06-07 ENCOUNTER — Other Ambulatory Visit: Payer: Medicare PPO

## 2020-06-07 ENCOUNTER — Ambulatory Visit: Payer: Medicare PPO | Admitting: Oncology

## 2020-06-07 LAB — SURGICAL PATHOLOGY

## 2020-06-08 ENCOUNTER — Inpatient Hospital Stay: Payer: Medicare PPO

## 2020-06-08 ENCOUNTER — Encounter: Payer: Self-pay | Admitting: Oncology

## 2020-06-08 ENCOUNTER — Inpatient Hospital Stay: Payer: Medicare PPO | Attending: Oncology | Admitting: Oncology

## 2020-06-08 VITALS — BP 120/72 | HR 95 | Resp 20 | Wt 233.6 lb

## 2020-06-08 DIAGNOSIS — Z7189 Other specified counseling: Secondary | ICD-10-CM | POA: Diagnosis not present

## 2020-06-08 DIAGNOSIS — Z170 Estrogen receptor positive status [ER+]: Secondary | ICD-10-CM | POA: Insufficient documentation

## 2020-06-08 DIAGNOSIS — Z87891 Personal history of nicotine dependence: Secondary | ICD-10-CM | POA: Insufficient documentation

## 2020-06-08 DIAGNOSIS — C50919 Malignant neoplasm of unspecified site of unspecified female breast: Secondary | ICD-10-CM

## 2020-06-08 DIAGNOSIS — C50412 Malignant neoplasm of upper-outer quadrant of left female breast: Secondary | ICD-10-CM | POA: Insufficient documentation

## 2020-06-08 DIAGNOSIS — Z803 Family history of malignant neoplasm of breast: Secondary | ICD-10-CM | POA: Insufficient documentation

## 2020-06-08 LAB — COMPREHENSIVE METABOLIC PANEL
ALT: 28 U/L (ref 0–44)
AST: 25 U/L (ref 15–41)
Albumin: 4.1 g/dL (ref 3.5–5.0)
Alkaline Phosphatase: 120 U/L (ref 38–126)
Anion gap: 13 (ref 5–15)
BUN: 23 mg/dL (ref 8–23)
CO2: 27 mmol/L (ref 22–32)
Calcium: 9.2 mg/dL (ref 8.9–10.3)
Chloride: 98 mmol/L (ref 98–111)
Creatinine, Ser: 1 mg/dL (ref 0.44–1.00)
GFR, Estimated: >60 mL/min (ref >60)
Glucose, Bld: 92 mg/dL (ref 70–99)
Potassium: 3.2 mmol/L — ABNORMAL LOW (ref 3.5–5.1)
Sodium: 138 mmol/L (ref 135–145)
Total Bilirubin: 0.5 mg/dL (ref 0.3–1.2)
Total Protein: 7.5 g/dL (ref 6.5–8.1)

## 2020-06-10 DIAGNOSIS — Z170 Estrogen receptor positive status [ER+]: Secondary | ICD-10-CM | POA: Insufficient documentation

## 2020-06-10 DIAGNOSIS — C50412 Malignant neoplasm of upper-outer quadrant of left female breast: Secondary | ICD-10-CM | POA: Insufficient documentation

## 2020-06-10 DIAGNOSIS — Z7189 Other specified counseling: Secondary | ICD-10-CM | POA: Insufficient documentation

## 2020-06-10 NOTE — Progress Notes (Signed)
Hematology/Oncology Consult note [facility] Telephone:([phone] [phone]  Patient Care Team: Jerrol Banana., MD as PCP - General (Family Medicine) Lorelee Cover., MD as Consulting Physician (Ophthalmology) Dimmig, Marcello Moores, MD as Referring Physician (Orthopedic Surgery) Oneta Rack, MD (Dermatology) Dasher, Rayvon Char, MD (Dermatology)   Name of the patient: Victoria Dean  [id number]  09-01-1950    Reason for referral-new diagnosis of breast cancer   Referring physician-Dr. Rosanna Randy  Date of visit: 06/10/20   History of presenting illness- Patient is a 70 year old female with a past medical history significant for claustrophobia, hypertension hypercholesterolemia, depression among other medical problems.  Patient began to notice retraction of her left nipple about 4 weeks ago.  This was followed by a diagnostic mammogram which showed vague shadowing in the upper outer quadrant of the left breast without a definitive mass.  Benign cysts in the lower left breast measuring 1.5 cm.  No evidence of malignancy in the right breast.  No evidence of abnormal left axillary lymph nodes.  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  Family history significant for patient's mom who had breast cancer at the age of 71.  She has already met with Dr. Bary Castilla and will be undergoing bilateral breast MRI next week.  She has undergone TAH/BSO in the past.    ECOG PS- 1  Pain scale- 0   Review of systems- Review of Systems  Constitutional: Negative for chills, fever, malaise/fatigue and weight loss.  HENT: Negative for congestion, ear discharge and nosebleeds.   Eyes: Negative for blurred vision.  Respiratory: Negative for cough, hemoptysis, sputum production, shortness of  breath and wheezing.   Cardiovascular: Negative for chest pain, palpitations, orthopnea and claudication.  Gastrointestinal: Negative for abdominal pain, blood in stool, constipation, diarrhea, heartburn, melena, nausea and vomiting.  Genitourinary: Negative for dysuria, flank pain, frequency, hematuria and urgency.  Musculoskeletal: Negative for back pain, joint pain and myalgias.  Skin: Negative for rash.  Neurological: Negative for dizziness, tingling, focal weakness, seizures, weakness and headaches.  Endo/Heme/Allergies: Does not bruise/bleed easily.  Psychiatric/Behavioral: Negative for depression and suicidal ideas. The patient does not have insomnia.     Allergies  Allergen Reactions  . Statins     Muscle weakness  . Sulfa Antibiotics   . Zetia [Ezetimibe]     Leg pain, upset stomach    Patient Active Problem List   Diagnosis Date Noted  . Personal history of tobacco use, presenting hazards to health 02/23/2018  . History of lumbar laminectomy 04/30/2016  . Actinic keratosis 11/21/2014  . Adaptation reaction 11/21/2014  . Allergic rhinitis 11/21/2014  . Cornu cutaneum 11/21/2014  . Clinical depression 11/21/2014  . Dermatitis, eczematoid 11/21/2014  . Enthesopathy 11/21/2014  . Essential (primary) hypertension 11/21/2014  . Anxiety, generalized 11/21/2014  . Herniated nucleus pulposus 11/21/2014  . Mild episode of recurrent major depressive disorder ([place]) 11/21/2014  . Adiposity 11/21/2014  . Arthritis, degenerative 11/21/2014  . Basal cell papilloma 11/21/2014  . Apnea, sleep 11/21/2014  . Avitaminosis D 11/21/2014     Past Medical History:  Diagnosis Date  . Cataract   . Hypercholesteremia   . Hypertension   . Major depressive disorder   . 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  Vitamin D deficiency      Past Surgical History:  Procedure Laterality Date  . ABDOMINAL HYSTERECTOMY  1995  . APPENDECTOMY   1994  . BREAST BIOPSY Right 1998   neg  . BREAST BIOPSY Left 05/30/2020   stere bx x clip path pending  . ESI    . MICRODISCECTOMY LUMBAR  2008   L4-5  . SQUAMOUS CELL CARCINOMA EXCISION    . TONSILLECTOMY  1971    Social History   Socioeconomic History  . Marital status: Widowed    Spouse name: Not on file  . Number of children: 1  . Years of education: Not on file  . Highest education level: Bachelor's degree (e.g., BA, AB, BS)  Occupational History  . Occupation: retired  Tobacco Use  . Smoking status: Former Smoker    Packs/day: 0.91    Years: 33.00    Pack years: 30.03    Types: Cigarettes    Quit date: 04/19/2018    Years since quitting: 2.1  . Smokeless tobacco: Never Used  Vaping Use  . Vaping Use: Never used  Substance and Sexual Activity  . Alcohol use: Yes    Comment: Rare- maybe once year  . Drug use: No  . Sexual activity: Not Currently  Other Topics Concern  . Not on file  Social History Narrative  . Not on file   Social Determinants of Health   Financial Resource Strain: Not on file  Food Insecurity: Not on file  Transportation Needs: Not on file  Physical Activity: Not on file  Stress: Not on file  Social Connections: Not on file  Intimate Partner Violence: Not on file     Family History  Problem Relation Age of Onset  . Cancer Mother        Bone and breast cancer  . Breast cancer Mother 19  . Hypertension Brother   . COPD Father   . Hypertension Father      Current Outpatient Medications:  .  Biotin 1000 MCG tablet, Take by mouth., Disp: , Rfl:  .  Cholecalciferol (VITAMIN D3) 3000 units TABS, Take by mouth daily. , Disp: , Rfl:  .  diazepam (VALIUM) 5 MG tablet, Take 1 tablet (5 mg total) by mouth at bedtime as needed for anxiety., Disp: 30 tablet, Rfl: 5 .  losartan (COZAAR) 100 MG tablet, Take 1 tablet (100 mg total) by mouth daily., Disp: 90 tablet, Rfl: 4 .  mometasone (ELOCON) 0.1 % cream, Apply topically., Disp: , Rfl:  .   rosuvastatin (CRESTOR) 5 MG tablet, Take 1 tablet (5 mg total) by mouth daily., Disp: 90 tablet, Rfl: 3 .  triamterene-hydrochlorothiazide (DYAZIDE) 37.5-25 MG capsule, Take 1 each (1 capsule total) by mouth daily., Disp: 90 capsule, Rfl: 4 .  venlafaxine XR (EFFEXOR XR) 75 MG 24 hr capsule, Take 3 capsules (225 mg total) by mouth daily with breakfast., Disp: 270 capsule, Rfl: 1   Physical exam:  Vitals:   06/08/20 1036  BP: 120/72  Pulse: 95  Resp: 20  SpO2: 97%  Weight: 233 lb 9.6 oz (106 kg)   Physical Exam Constitutional:      General: She is not in acute distress. Cardiovascular:     Rate and Rhythm: Normal rate and regular rhythm.     Heart sounds: Normal heart sounds.  Pulmonary:     Effort: Pulmonary effort is normal.     Breath sounds: Normal breath sounds.  Abdominal:     General: Bowel sounds  are normal.     Palpations: Abdomen is soft.  Skin:    General: Skin is warm and dry.  Neurological:     Mental Status: She is alert and oriented to person, place, and time.      Breast exam: Performed in sitting and laying down position.  Left nipple appears retracted.  No overt palpable mass in the left breast.  No palpable left axillary adenopathy.  No palpable right breast masses or right axillary adenopathy.  CMP Latest Ref Rng & Units 06/08/2020  Glucose 70 - 99 mg/dL 92  BUN 8 - 23 mg/dL 23  Creatinine 0.44 - 1.00 mg/dL 1.00  Sodium 135 - 145 mmol/L 138  Potassium 3.5 - 5.1 mmol/L 3.2(L)  Chloride 98 - 111 mmol/L 98  CO2 22 - 32 mmol/L 27  Calcium 8.9 - 10.3 mg/dL 9.2  Total Protein 6.5 - 8.1 g/dL 7.5  Total Bilirubin 0.3 - 1.2 mg/dL 0.5  Alkaline Phos 38 - 126 U/L 120  AST 15 - 41 U/L 25  ALT 0 - 44 U/L 28   CBC Latest Ref Rng & Units 02/23/2020  WBC 3.4 - 10.8 x10E3/uL 8.9  Hemoglobin 11.1 - 15.9 g/dL 11.9  Hematocrit 34.0 - 46.6 % 37.5  Platelets 150 - 450 x10E3/uL 274    No images are attached to the encounter.  US BREAST LTD UNI LEFT INC  AXILLA  Result Date: 05/22/2020 CLINICAL DATA:  Patient describes recent inversion of the LEFT nipple. Ordering physician describes a palpable lump within the LEFT breast at the 2 o'clock axis. EXAM: DIGITAL DIAGNOSTIC BILATERAL MAMMOGRAM WITH TOMOSYNTHESIS AND CAD; ULTRASOUND LEFT BREAST LIMITED TECHNIQUE: Bilateral digital diagnostic mammography and breast tomosynthesis was performed. The images were evaluated with computer-aided detection.; Targeted ultrasound examination of the left breast was performed COMPARISON:  Previous exams including most recent bilateral screening mammogram dated 04/16/2018. ACR Breast Density Category b: There are scattered areas of fibroglandular density. FINDINGS: LEFT breast: There is architectural distortion within the outer LEFT breast, best seen on spot compression CC slice 35, 2-3 o'clock axis based on tomosynthesis slice position, without convincing correlate on MLO or true lateral views. Partially obscured low-density masses are seen within the lower LEFT breast, 5-6 o'clock axis, at anterior and posterior depth, largest at posterior depth measuring approximately 1.3 cm greatest dimension. There is new LEFT nipple inversion/retraction. RIGHT breast: There are no new dominant masses, suspicious calcifications or secondary signs of malignancy within the RIGHT breast. LEFT breast: Targeted ultrasound is performed, showing multiple benign cysts within the lower LEFT breast, 5-6 o'clock axis, largest measuring 1.5 cm, corresponding to the mammographic findings. There is vague shadowing within the upper-outer quadrant of the LEFT breast, but without definitive correlate for the architectural distortion seen on mammogram. LEFT axilla was evaluated with ultrasound showing no enlarged or morphologically abnormal lymph nodes. IMPRESSION: 1. Architectural distortion within the outer LEFT breast, only seen on CC views, best seen on spot compression CC slice 35, located at the 2-3 o'clock  axis based on tomosynthesis slice position which corresponds to the palpable area of concern described by the ordering physician, without definitive sonographic correlate. This is a suspicious finding for which stereotactic biopsy is recommended. 2. Benign cysts within the lower LEFT breast, largest measuring 1.5 cm. 3. No evidence of malignancy within the RIGHT breast. RECOMMENDATION: Stereotactic biopsy for the architectural distortion within the outer LEFT breast. Ordering physician will be contacted and stereotactic biopsy will be scheduled at patient's convenience. I have  discussed the findings and recommendations with the patient. If applicable, a reminder letter will be sent to the patient regarding the next appointment. BI-RADS CATEGORY  4: Suspicious. Electronically Signed   By: Franki Cabot M.D.   On: 05/22/2020 11:30   MM DIAG BREAST TOMO BILATERAL  Result Date: 05/22/2020 CLINICAL DATA:  Patient describes recent inversion of the LEFT nipple. Ordering physician describes a palpable lump within the LEFT breast at the 2 o'clock axis. EXAM: DIGITAL DIAGNOSTIC BILATERAL MAMMOGRAM WITH TOMOSYNTHESIS AND CAD; ULTRASOUND LEFT BREAST LIMITED TECHNIQUE: Bilateral digital diagnostic mammography and breast tomosynthesis was performed. The images were evaluated with computer-aided detection.; Targeted ultrasound examination of the left breast was performed COMPARISON:  Previous exams including most recent bilateral screening mammogram dated 04/16/2018. ACR Breast Density Category b: There are scattered areas of fibroglandular density. FINDINGS: LEFT breast: There is architectural distortion within the outer LEFT breast, best seen on spot compression CC slice 35, 2-3 o'clock axis based on tomosynthesis slice position, without convincing correlate on MLO or true lateral views. Partially obscured low-density masses are seen within the lower LEFT breast, 5-6 o'clock axis, at anterior and posterior depth, largest at  posterior depth measuring approximately 1.3 cm greatest dimension. There is new LEFT nipple inversion/retraction. RIGHT breast: There are no new dominant masses, suspicious calcifications or secondary signs of malignancy within the RIGHT breast. LEFT breast: Targeted ultrasound is performed, showing multiple benign cysts within the lower LEFT breast, 5-6 o'clock axis, largest measuring 1.5 cm, corresponding to the mammographic findings. There is vague shadowing within the upper-outer quadrant of the LEFT breast, but without definitive correlate for the architectural distortion seen on mammogram. LEFT axilla was evaluated with ultrasound showing no enlarged or morphologically abnormal lymph nodes. IMPRESSION: 1. Architectural distortion within the outer LEFT breast, only seen on CC views, best seen on spot compression CC slice 35, located at the 2-3 o'clock axis based on tomosynthesis slice position which corresponds to the palpable area of concern described by the ordering physician, without definitive sonographic correlate. This is a suspicious finding for which stereotactic biopsy is recommended. 2. Benign cysts within the lower LEFT breast, largest measuring 1.5 cm. 3. No evidence of malignancy within the RIGHT breast. RECOMMENDATION: Stereotactic biopsy for the architectural distortion within the outer LEFT breast. Ordering physician will be contacted and stereotactic biopsy will be scheduled at patient's convenience. I have discussed the findings and recommendations with the patient. If applicable, a reminder letter will be sent to the patient regarding the next appointment. BI-RADS CATEGORY  4: Suspicious. Electronically Signed   By: Franki Cabot M.D.   On: 05/22/2020 11:30   MM CLIP PLACEMENT LEFT  Result Date: 05/30/2020 CLINICAL DATA:  Status post stereotactic biopsy for suspicious architectural distortion within the outer LEFT breast. EXAM: DIAGNOSTIC LEFT MAMMOGRAM POST STEREOTACTIC BIOPSY  COMPARISON:  Previous exam(s). FINDINGS: Mammographic images were obtained following stereotactic guided biopsy of suspicious distortion within the outer LEFT breast. The biopsy marking clip is in expected position at the site of biopsy. IMPRESSION: Appropriate positioning of the X shaped biopsy marking clip at the site of biopsy in the upper-outer quadrant of the LEFT breast. Final Assessment: Post Procedure Mammograms for Marker Placement Electronically Signed   By: Franki Cabot M.D.   On: 05/30/2020 09:31   MM LT BREAST BX W LOC DEV 1ST LESION IMAGE BX SPEC STEREO GUIDE  Addendum Date: 06/01/2020   ADDENDUM REPORT: 06/01/2020 14:11 ADDENDUM: PATHOLOGY revealed: A. BREAST, LEFT UPPER OUTER QUADRANT; STEREOTACTIC BIOPSY: -  INVASIVE MAMMARY CARCINOMA WITH FOCAL LOBULAR FEATURES. - LOBULAR NEOPLASIA. Size of invasive carcinoma: 5 mm in this sample. Grade 2. Ductal carcinoma in situ: Present, low-grade. Lymphovascular invasion: Not identified. Pathology results are CONCORDANT with imaging findings, per Dr. Franki Cabot. Pathology results and recommendations below were discussed with patient by telephone on 05/31/2020. Patient reported biopsy site within normal limits with slight tenderness at the site, and no significant bruising. Post biopsy care instructions were reviewed, questions were answered and my direct phone number was provided to patient. Patient was instructed to call [facility] if any concerns or questions arise related to the biopsy. Recommendations: 1. Surgical consultation. Request for surgical consultation relayed to Al Pimple RN and Tanya Nones RN at [facility] by Electa Sniff RN ON 05/31/2020. 2.  Recommend bilateral MRI to define extent. Pathology results reported by Electa Sniff RN on 06/01/2020. Electronically Signed   By: Franki Cabot M.D.   On: 06/01/2020 14:11   Result Date: 06/01/2020 CLINICAL DATA:  Patient with suspicious architectural distortion  within the outer LEFT breast presents today for stereotactic biopsy. EXAM: LEFT BREAST STEREOTACTIC CORE NEEDLE BIOPSY COMPARISON:  Previous exams. FINDINGS: The patient and I discussed the procedure of stereotactic-guided biopsy including benefits and alternatives. We discussed the high likelihood of a successful procedure. We discussed the risks of the procedure including infection, bleeding, tissue injury, clip migration, and inadequate sampling. Informed written consent was given. The usual time out protocol was performed immediately prior to the procedure. Using sterile technique and 1% Lidocaine as local anesthetic, under stereotactic guidance, a 9 gauge vacuum assisted device was used to perform core needle biopsy of distortion in the outer left breast using a superior approach. Lesion quadrant: Outer At the conclusion of the procedure, X shaped tissue marker clip was deployed into the biopsy cavity. Follow-up 2-view mammogram was performed and dictated separately. IMPRESSION: Stereotactic-guided biopsy of architectural distortion within the outer LEFT breast. No apparent complications. Electronically Signed: By: Franki Cabot M.D. On: 05/30/2020 09:24    Assessment and plan- Patient is a 70 y.o. female with newly diagnosed invasive mammary carcinoma with lobular features of the right breast likely stage I cT1 N0 M0 ER/PR positive and HER2 negative here to discuss further management  Discussed the results of ultrasound and mammogram with the patient in detail as well as the results of the biopsy.  Patient noted to have an area of distortion in her left breast upper outer quadrant which was biopsy-proven invasive carcinoma with lobular features it was ER/PR greater than 90% positive and HER2 negative.  Known area of benign cyst in the lower inner quadrant.  Discussed with the patient that lobular carcinoma can sometimes be more extensive than what appears on the mammogram as well as bilateral.  Patient  will therefore be getting MRI bilateral breast to a certain the actual extent of the disease.  Following that patient will be proceeding with lumpectomy and sentinel lymph node biopsy by Dr. Bary Castilla.  Discussed that lobular carcinomas typically do not require adjuvant chemotherapy but based on final pathology I will determine mine if Oncotype testing needs to be sent to decide if she would benefit from adjuvant chemotherapy.  Discussed what Oncotype testing is and how the results are interpreted.  If the patient falls in the lower intermediate risk group with a score of 25 or less she would not benefit from adjuvant chemotherapy.  If her score is 26 or higher she would benefit from adjuvant  chemotherapy.  Patient is willing to accept adjuvant chemotherapy if determined that that is necessary as per Oncotype testing.  Patient would also benefit from adjuvant radiation treatment upon completion of lumpectomy which is typically for 5 weeks.  Given that her tumor is ER/PR positive she would benefit from adjuvant hormone therapy for at least 5 years which I will be discussing in greater detail after her surgery.  Treatment will be given with a curative intent  Given her personal history and family history of breast cancer we will also be referring her to genetic counseling   Thank you for this kind referral and the opportunity to participate in the care of this patient   Visit Diagnosis 1. Malignant neoplasm of upper-outer quadrant of left breast in female, estrogen receptor positive ([place])   2. Goals of care, counseling/discussion     Dr. Randa Evens, MD, MPH [facility] [place] at [facility] [phone] 06/10/2020

## 2020-06-10 NOTE — Progress Notes (Signed)
Supported patient, daughter Victoria Dean at initial Med/Onc consult with Dr. Janese Banks.   Given Breast Cancer treatment handbook. And Care Plan Surmmary.  Escorted to lab for Genetic testing .

## 2020-06-11 NOTE — Progress Notes (Signed)
Confirmed post MRI appointement with Dr. Bary Castilla.  Addressed patient questions.  MRI tomorrow.

## 2020-06-12 ENCOUNTER — Other Ambulatory Visit: Payer: Self-pay

## 2020-06-12 ENCOUNTER — Ambulatory Visit
Admission: RE | Admit: 2020-06-12 | Discharge: 2020-06-12 | Disposition: A | Discharge status: Home / Self Care | Payer: Medicare PPO | Source: Ambulatory Visit | Attending: General Surgery | Admitting: General Surgery

## 2020-06-12 DIAGNOSIS — N6453 Retraction of nipple: Secondary | ICD-10-CM | POA: Diagnosis not present

## 2020-06-12 DIAGNOSIS — C50412 Malignant neoplasm of upper-outer quadrant of left female breast: Secondary | ICD-10-CM | POA: Diagnosis not present

## 2020-06-12 DIAGNOSIS — N6011 Diffuse cystic mastopathy of right breast: Secondary | ICD-10-CM | POA: Diagnosis not present

## 2020-06-12 DIAGNOSIS — Z86000 Personal history of in-situ neoplasm of breast: Secondary | ICD-10-CM | POA: Diagnosis not present

## 2020-06-12 MED ORDER — GADOBUTROL 1 MMOL/ML IV SOLN
10.0000 mL | Freq: Once | INTRAVENOUS | Status: AC | PRN
  Administered 2020-06-12: 10 mL via INTRAVENOUS

## 2020-06-19 ENCOUNTER — Other Ambulatory Visit: Payer: Self-pay | Admitting: General Surgery

## 2020-06-19 DIAGNOSIS — C50412 Malignant neoplasm of upper-outer quadrant of left female breast: Secondary | ICD-10-CM | POA: Diagnosis not present

## 2020-06-19 DIAGNOSIS — E876 Hypokalemia: Secondary | ICD-10-CM | POA: Diagnosis not present

## 2020-06-19 NOTE — Progress Notes (Signed)
Subjective:     Patient ID: Victoria Dean is a 70 y.o. female.  HPI  The following portions of the patient's history were reviewed and updated as appropriate.  This an established patient is here today for: office visit. She is here for her followup breast MRI and discuss surgery.  The patient is accompanied by her daughter, [name].  The patient met with Dr. Janese Banks from the cancer center last week.  During that visit laboratory studies were obtained showing a potassium of 3.2.  The patient has not had issues with hypokalemia in the past, and her prior potassium was over 4.  Review of Systems  Constitutional: Negative for chills and fever.  Respiratory: Negative for cough.         Chief Complaint  Patient presents with  . Follow-up    MRI     BP 118/64   Pulse 104   Temp 36.4 C (97.5 F)   Ht 160 cm (_0 )   Wt (!) 106.1 kg (234 lb)   SpO2 96%   BMI 41.45 kg/m       Past Medical History:  Diagnosis Date  . Cataract   . Claustrophobia   . Hypercholesteremia   . Hypertension   . Major depressive disorder   . 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 Vitamin D deficiency           Past Surgical History:  Procedure Laterality Date  . ABDOMINAL HYSTERECTOMY  1995  . APPENDECTOMY  1994  . BREAST EXCISIONAL BIOPSY Left 05/30/2020  . BREAST EXCISIONAL BIOPSY Right 1998  . ESI    . MICRODISCECTOMY LUMBAR  2008  . squamous cell carcinoma excision    . TONSILLECTOMY  1971              OB History    Gravida  1   Para  1   Term      Preterm      AB      Living        SAB      IAB      Ectopic      Molar      Multiple      Live Births          Obstetric Comments  Age at first period 40 Age of first pregnancy 62         Social History          Socioeconomic History  . Marital status: Widowed  Tobacco Use  . Smoking status: Former  Smoker    Packs/day: 0.91    Years: 33.00    Pack years: 30.03    Quit date: 04/19/2018    Years since quitting: 2.1  . Smokeless tobacco: Never Used  Substance and Sexual Activity  . Alcohol use: Yes    Comment: occasionally  . Drug use: Never            Allergies  Allergen Reactions  . Statins-Hmg-Coa Reductase Inhibitors Other (See Comments)    Muscle weakness  . Sulfa (Sulfonamide Antibiotics) Unknown  . Zetia [Ezetimibe] Other (See Comments)    Leg pain and upset stomach    Current Medications        Current Outpatient Medications  Medication Sig Dispense Refill  . biotin 1 mg tablet Take 1,000 mcg by mouth once daily    . cholecalciferol, vitamin D3,  75 mcg (3,000 unit) Tab Take by mouth once daily    . diazePAM (VALIUM) 5 MG tablet Take by mouth    . hydrocortisone 2.5 % cream Apply topically continuously as needed    . losartan (COZAAR) 100 MG tablet Take 100 mg by mouth once daily    . mometasone (ELOCON) 0.1 % cream Apply topically continuously as needed    . rosuvastatin (CRESTOR) 5 MG tablet Take 5 mg by mouth once daily    . triamterene-hydrochlorothiazide (DYAZIDE) 37.5-25 mg capsule Take by mouth    . venlafaxine (EFFEXOR-XR) 75 MG XR capsule Take by mouth    . lidocaine-prilocaine (EMLA) cream Apply to areola one hour prior to arrival day of procedure. Cover with saran wrap. 5 g 0   No current facility-administered medications for this visit.           Family History  Problem Relation Age of Onset  . Bone cancer Mother   . Breast cancer Mother 72  . High blood pressure (Hypertension) Father   . COPD Father   . High blood pressure (Hypertension) Brother          Objective:   Physical Exam Exam conducted with a chaperone present.  Constitutional:      Appearance: Normal appearance.  Cardiovascular:     Rate and Rhythm: Normal rate and regular rhythm.     Pulses: Normal pulses.     Heart  sounds: Normal heart sounds.  Pulmonary:     Effort: Pulmonary effort is normal.     Breath sounds: Normal breath sounds.  Chest:  Breasts:     Left: Inverted nipple and mass (Ill deinded fullness from the base of the nipple up into the 2 oclock position of the breast. ) present. No axillary adenopathy or supraclavicular adenopathy.        Comments: The areola is very faint, pigmentation almost equal to that of surrounding skin. Musculoskeletal:     Cervical back: Neck supple.  Lymphadenopathy:     Upper Body:     Left upper body: No supraclavicular or axillary adenopathy.  Skin:    General: Skin is warm and dry.  Neurological:     Mental Status: She is alert and oriented to person, place, and time.  Psychiatric:        Mood and Affect: Mood normal.        Behavior: Behavior normal.    Labs and Radiology:   Bilateral breast MRI dated Jun 12, 2020: This study was independently reviewed:  IMPRESSION: 1. Biopsy-proven malignancy in the upper outer left breast spanning an area measuring 7 x 6 x 4 cm. 2. No evidence of malignancy elsewhere in either breast. 3. Two left inferior axillary lymph nodes with mild cortical thickening, suspicious for metastatic nodes. 4. Multiple bilateral breast cysts.  RECOMMENDATION: 1. Left axillary ultrasound to evaluate the 2 possible metastatic inferior axillary lymph nodes. 2. Treatment plan.  May 22, 2020 left axilla ultrasound:  LEFT axilla was evaluated with ultrasound showing no enlarged or morphologically abnormal lymph nodes.  Ultrasound:  Ultrasound examination of the left breast was undertaken to determine extent of planned resection and potential need to sacrifice the nipple/areolar complex.  In the area of soft thickening in the upper outer quadrant of the left breast extending to the base of the nipple at the 2 o'clock position there is evidence of significant architectural distortion with cystic and solid  lesions reaching a maximum diameter of 2 cm from the nipple  and then extending up to the base of the nipple.  Disease below the level of the nipple is not noted.  The focal band of thickening, corresponding the area of abnormality on the MRI extends to a distance of almost 8 cm from the nipple.  More distal to this the echo architecture of the upper outer quadrant is similar to the upper inner quadrant.  The process extends up to the base of the nipple.  BI-RADS-6.  Laboratory review February 23, 2020:  Glucose 65 - 99 mg/dL 90   BUN 8 - 27 mg/dL 21   Creatinine, Ser 0.57 - 1.00 mg/dL 1.33High   GFR calc non Af Amer >59 mL/min/1.73 40Low   GFR calc Af Amer >59 mL/min/1.73 46Low   Comment: **In accordance with recommendations from the NKF-ASN Task force,**   [facility] is in the process of updating its eGFR calculation to the   2021 CKD-EPI creatinine equation that estimates kidney function   without a race variable.   BUN/Creatinine Ratio 12 - 28 16   Sodium 134 - 144 mmol/L 141   Potassium 3.5 - 5.2 mmol/L 4.1   Chloride 96 - 106 mmol/L 101   CO2 20 - 29 mmol/L 28   Calcium 8.7 - 10.3 mg/dL 9.6   Total Protein 6.0 - 8.5 g/dL 6.8   Albumin 3.7 - 4.7 g/dL 4.5   Globulin, Total 1.5 - 4.5 g/dL 2.3   Albumin/Globulin Ratio 1.2 - 2.2 2.0   Bilirubin Total 0.0 - 1.2 mg/dL 0.3   Alkaline Phosphatase 44 - 121 IU/L 166High   AST 0 - 40 IU/L 19   ALT 0 - 32 IU/L 21   Alkaline phosphatase mildly elevated for the last 3 years dating back to January 2019.  WBC 3.4 - 10.8 x10E3/uL 8.9   RBC 3.77 - 5.28 x10E6/uL 4.11   Hemoglobin 11.1 - 15.9 g/dL 11.9   Hematocrit 34.0 - 46.6 % 37.5   MCV 79 - 97 fL 91   MCH 26.6 - 33.0 pg 29.0   MCHC 31.5 - 35.7 g/dL 31.7   RDW 11.7 - 15.4 % 13.1   Platelets 150 - 450 x10E3/uL 274   Neutrophils Not Estab. % 54   Lymphs Not Estab. % 34   Monocytes Not Estab. % 9   Eos Not Estab. % 2   Basos Not Estab. % 1   Neutrophils Absolute 1.4 - 7.0  x10E3/uL 4.9   Lymphocytes Absolute 0.7 - 3.1 x10E3/uL 3.0   Monocytes Absolute 0.1 - 0.9 x10E3/uL 0.8   EOS (ABSOLUTE) 0.0 - 0.4 x10E3/uL 0.1   Basophils Absolute 0.0 - 0.2 x10E3/uL 0.1   Immature Granulocytes Not Estab. 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    Unifocal disease on MRI.  Normal lymph nodes on recent abdominal ultrasound, no indication for repeat exam.  Hypokalemia, potentially fictitious.  (Hypertension presently managed with Dyazide  diuretic).    Plan:     I reviewed with the patient and her daughter the potential for MRI to over estimate extent of disease.  The ultrasound findings seem to confirm at least a 6 cm band in the upper outer quadrant.  Risk of positive margins increases with increasing tumor volume, and nationally 15-30% of people will have positive margins requiring reexploration and excision.  This does not preclude consideration for breast conservation.  The patient was concerned about asymmetry and I anticipate the left breast will likely be one half, 1 cup size smaller than the right with a slightly less ptotic appearance.  The possibility of mastectomy with or without immediate reconstruction was reviewed and opportunity for plastic surgery consultation discussed.  In light of today's ultrasound I think could be unlikely to be able to salvage the nipple areolar complex, and considering how pale the areola is to the surrounding tissue, she would likely do better with a tattoo than attempts to compromise surgical margins in this area.   The tethering evident on clinical exam may be purely related to the ductal structures and not direct invasion of the base of the nipple, but I have recommended excision.  A request for a potassium was sent to [facility] and the patient will have this completed at her PCPs office in the next 24 hours in the event that potassium supplementation is required prior to surgery.    Over 30 minutes were spent with the patient, the majority of which was spent reviewing options for surgical management.  This note is partially prepared by Karie Fetch, RN, acting as a scribe in the presence of Dr. Hervey Ard, MD.  The documentation recorded by the scribe accurately reflects the service I personally performed and the decisions made by me.   Robert Bellow, MD FACS

## 2020-06-20 ENCOUNTER — Inpatient Hospital Stay (HOSPITAL_BASED_OUTPATIENT_CLINIC_OR_DEPARTMENT_OTHER): Payer: Medicare PPO | Admitting: Hospice and Palliative Medicine

## 2020-06-20 ENCOUNTER — Telehealth: Payer: Self-pay | Admitting: Oncology

## 2020-06-20 ENCOUNTER — Other Ambulatory Visit: Payer: Self-pay | Admitting: *Deleted

## 2020-06-20 DIAGNOSIS — Z170 Estrogen receptor positive status [ER+]: Secondary | ICD-10-CM

## 2020-06-20 DIAGNOSIS — C50412 Malignant neoplasm of upper-outer quadrant of left female breast: Secondary | ICD-10-CM

## 2020-06-20 DIAGNOSIS — E876 Hypokalemia: Secondary | ICD-10-CM

## 2020-06-20 NOTE — Progress Notes (Signed)
Multidisciplinary Oncology Council Documentation  Victoria Dean was presented by our [facility] on 06/20/2020, which included representatives from:  . Palliative Care . Dietitian  . Physical/Occupational Therapist . Nurse Navigator . Genetics . Speech Therapist . Social work . Survivorship RN . Hotel manager . Research RN . [place] Representative  Victoria Dean currently presents with history of breast cancer  We reviewed previous medical and familial history, history of present illness, and recent lab results along with all available histopathologic and imaging studies. The [place] considered available treatment options and made the following recommendations/referrals:  Genetics   The MOC is a meeting of clinicians from various specialty areas who evaluate and discuss patients for whom a multidisciplinary approach is being considered. Final determinations in the plan of care are those of the provider(s).   Today's extended care, comprehensive team conference, Victoria Dean was not present for the discussion and was not examined.

## 2020-06-20 NOTE — Telephone Encounter (Signed)
Left VM with patient to make her aware of appt on 6/13.

## 2020-06-21 LAB — POTASSIUM: Potassium: 4.2 mmol/L (ref 3.5–5.2)

## 2020-06-25 ENCOUNTER — Ambulatory Visit: Payer: Medicare PPO | Admitting: Family Medicine

## 2020-06-25 ENCOUNTER — Encounter: Payer: Self-pay | Admitting: Family Medicine

## 2020-06-25 ENCOUNTER — Other Ambulatory Visit: Payer: Self-pay

## 2020-06-25 VITALS — BP 111/78 | HR 89 | Temp 97.7°F | Resp 18 | Ht 63.0 in | Wt 238.0 lb

## 2020-06-25 DIAGNOSIS — Z170 Estrogen receptor positive status [ER+]: Secondary | ICD-10-CM

## 2020-06-25 DIAGNOSIS — G4733 Obstructive sleep apnea (adult) (pediatric): Secondary | ICD-10-CM | POA: Diagnosis not present

## 2020-06-25 DIAGNOSIS — C50412 Malignant neoplasm of upper-outer quadrant of left female breast: Secondary | ICD-10-CM | POA: Diagnosis not present

## 2020-06-25 DIAGNOSIS — F330 Major depressive disorder, recurrent, mild: Secondary | ICD-10-CM

## 2020-06-25 NOTE — Progress Notes (Signed)
I,[name],acting as a scribe for [name], MD.,have documented all relevant documentation on the behalf of [name], MD,as directed by  [name], MD while in the presence of [name], MD.   Established patient visit   Patient: Victoria Dean   DOB: [birth year]   69 y.o. Female  MRN: [id number] Visit Date: 06/25/2020  Today's healthcare provider: [name], MD   Chief Complaint  Patient presents with  . Follow-up  . Hypertension  . Depression   Subjective    HPI  Patient is scheduled in the next few days for lumpectomy for breast cancer.  She is also having sentinel node biopsy. Hypertension, follow-up  BP Readings from Last 3 Encounters:  06/25/20 111/78  06/08/20 120/72  05/15/20 140/85   Wt Readings from Last 3 Encounters:  06/25/20 238 lb (108 kg)  06/08/20 233 lb 9.6 oz (106 kg)  05/15/20 235 lb 3.2 oz (106.7 kg)     She was last seen for hypertension 4 months ago.  BP at that visit was 148/71. Management since that visit includes; [place] hypertension  with good control at home. She reports good compliance with treatment. She is not having side effects. nonew She is not exercising. She is adherent to low salt diet.   Outside blood pressures are 120/70.  She does not smoke.  Use of agents associated with hypertension: none.   --------------------------------------------------------------------  Depression, Follow-up  She  was last seen for this 4 months ago. Changes made at last visit include; On venlafaxine.   She reports good compliance with treatment. She is not having side effects. none  She reports good tolerance of treatment. Current symptoms include: depressed mood, fatigue, hopelessness and insomnia She feels she is Worse since last visit.  Depression screen [facility] 2/9 06/25/2020 05/15/2020 04/28/2019  Decreased Interest 2 1 1   Down, Depressed, Hopeless 2 0 1  PHQ - 2 Score 4 1 2   Altered  sleeping 2 0 1  Tired, decreased energy 3 1 1   Change in appetite 2 2 0  Feeling bad or failure about yourself  1 0 0  Trouble concentrating 0 0 0  Moving slowly or fidgety/restless 0 0 0  Suicidal thoughts 0 0 0  PHQ-9 Score 12 4 4   Difficult doing work/chores Very difficult Not difficult at all Not difficult at all  Some recent data might be hidden    --------------------------------------------------------------------       Medications: Outpatient Medications Prior to Visit  Medication Sig  . acetaminophen (TYLENOL) 500 MG tablet Take 500-1,000 mg by mouth every 8 (eight) hours as needed for moderate pain.  . Biotin 1000 MCG tablet Take 1,000 mcg by mouth daily.  . Cholecalciferol (VITAMIN D3) 25 MCG (1000 UT) CAPS Take 1,000 Units by mouth daily.  . diazepam (VALIUM) 5 MG tablet Take 1 tablet (5 mg total) by mouth at bedtime as needed for anxiety.  . hydrocortisone 2.5 % lotion Apply 1 application topically See admin instructions. After every face wash  . lidocaine-prilocaine (EMLA) cream Apply 1 application topically daily as needed (local anesthesia).  . losartan (COZAAR) 100 MG tablet Take 1 tablet (100 mg total) by mouth daily.  . mometasone (ELOCON) 0.1 % cream Apply 1 application topically daily as needed (irritation).  . rosuvastatin (CRESTOR) 5 MG tablet Take 1 tablet (5 mg total) by mouth daily. (Patient taking differently: Take 5 mg by mouth at bedtime.)  . triamterene-hydrochlorothiazide (DYAZIDE) 37.5-25  MG capsule Take 1 each (1 capsule total) by mouth daily.  Marland Kitchen venlafaxine XR (EFFEXOR XR) 75 MG 24 hr capsule Take 3 capsules (225 mg total) by mouth daily with breakfast. (Patient taking differently: Take 150 mg by mouth at bedtime.)   No facility-administered medications prior to visit.    Review of Systems  Constitutional: Negative for appetite change, chills, fatigue and fever.  Respiratory: Negative for chest tightness and shortness of breath.   Cardiovascular:  Negative for chest pain and palpitations.  Gastrointestinal: Negative for abdominal pain, nausea and vomiting.  Neurological: Negative for dizziness and weakness.        Objective    BP 111/78 (BP Location: Right Arm, Patient Position: Sitting, Cuff Size: Large)   Pulse 89   Temp 97.7 F (36.5 C) (Oral)   Resp 18   Ht 5\' 3"  (1.6 m)   Wt 238 lb (108 kg)   SpO2 95%   BMI 42.16 kg/m  BP Readings from Last 3 Encounters:  06/29/20 (!) 124/52  06/25/20 111/78  06/08/20 120/72   Wt Readings from Last 3 Encounters:  06/29/20 233 lb (105.7 kg)  06/27/20 238 lb (108 kg)  06/25/20 238 lb (108 kg)       Physical Exam Vitals reviewed. Exam conducted with a chaperone present.  Constitutional:      Appearance: Normal appearance. She is well-developed.  HENT:     Head: Normocephalic and atraumatic.     Right Ear: External ear normal.     Left Ear: External ear normal.     Mouth/Throat:     Pharynx: Oropharynx is clear.  Eyes:     General: No scleral icterus.    Conjunctiva/sclera: Conjunctivae normal.     Pupils: Pupils are equal, round, and reactive to light.  Neck:     Vascular: No carotid bruit.  Cardiovascular:     Rate and Rhythm: Normal rate and regular rhythm.     Pulses: Normal pulses.     Heart sounds: Normal heart sounds.  Pulmonary:     Effort: Pulmonary effort is normal.     Breath sounds: Normal breath sounds.  Chest:  Breasts:     Right: No mass.     Left: No mass.    Abdominal:     Palpations: Abdomen is soft.  Musculoskeletal:     Cervical back: Normal range of motion and neck supple.  Lymphadenopathy:     Cervical: No cervical adenopathy.  Skin:    General: Skin is warm and dry.     Comments: Very fair skin.  Neurological:     General: No focal deficit present.     Mental Status: She is alert and oriented to person, place, and time.     Deep Tendon Reflexes: Reflexes are normal and symmetric.  Psychiatric:        Mood and Affect: Mood normal.         Behavior: Behavior normal.        Thought Content: Thought content normal.        Judgment: Judgment normal.       No results found for any visits on 06/25/20.  Assessment & Plan     1. Malignant neoplasm of upper-outer quadrant of left breast in female, estrogen receptor positive ([place]) For surgery on the 27th.  2. Mild episode of recurrent major depressive disorder ([place]) On venlafaxine.  Consider counseling. PHQ-9 score today is 12.  Repeat on next visit. 3. Obstructive sleep apnea syndrome  Return in about 2 months (around 08/25/2020).      I, [name], MD, have reviewed all documentation for this visit. The documentation on 07/01/20 for the exam, diagnosis, procedures, and orders are all accurate and complete.    Rayshawn Visconti Cranford Mon, MD  [facility] [phone] (phone) [phone] (fax)  [place]

## 2020-06-26 ENCOUNTER — Inpatient Hospital Stay (HOSPITAL_BASED_OUTPATIENT_CLINIC_OR_DEPARTMENT_OTHER): Payer: Medicare PPO | Admitting: Licensed Clinical Social Worker

## 2020-06-26 ENCOUNTER — Encounter: Payer: Self-pay | Admitting: Licensed Clinical Social Worker

## 2020-06-26 ENCOUNTER — Inpatient Hospital Stay: Payer: Medicare PPO

## 2020-06-26 ENCOUNTER — Other Ambulatory Visit: Payer: Self-pay | Admitting: General Surgery

## 2020-06-26 DIAGNOSIS — Z800 Family history of malignant neoplasm of digestive organs: Secondary | ICD-10-CM | POA: Diagnosis not present

## 2020-06-26 DIAGNOSIS — Z1379 Encounter for other screening for genetic and chromosomal anomalies: Secondary | ICD-10-CM

## 2020-06-26 DIAGNOSIS — Z803 Family history of malignant neoplasm of breast: Secondary | ICD-10-CM | POA: Diagnosis not present

## 2020-06-26 DIAGNOSIS — Z170 Estrogen receptor positive status [ER+]: Secondary | ICD-10-CM | POA: Diagnosis not present

## 2020-06-26 DIAGNOSIS — C50412 Malignant neoplasm of upper-outer quadrant of left female breast: Secondary | ICD-10-CM

## 2020-06-26 NOTE — Progress Notes (Signed)
REFERRING PROVIDER: Sindy Guadeloupe, MD [place],  [place] [address]  PRIMARY PROVIDER:  Jerrol Dean., MD  PRIMARY REASON FOR VISIT:  1. Malignant neoplasm of upper-outer quadrant of left breast in female, estrogen receptor positive ([place])   2. Family history of breast cancer   3. Family history of stomach cancer   4. Genetic testing      HISTORY OF PRESENT ILLNESS:   Victoria Dean, a 70 y.o. female, was seen for a [facility] cancer genetics consultation at the request of Dr. Janese Dean due to a personal and family history of breast cancer.  Victoria Dean presents to clinic today to discuss the possibility of a hereditary predisposition to cancer, genetic testing, and to further clarify her future cancer risks, as well as potential cancer risks for family members.   In 2022, at the age of 65, Victoria Dean was diagnosed with invasive mammary carcinoma of the left breast, ER/PR+, Her2-. The treatment plan includes lumpectomy scheduled for 5/27, possible chemotherapy, adjuvant radiation, and adjuvant hormone therapy.   CANCER HISTORY:  Oncology History   No history exists.     RISK FACTORS:  Menarche was at age 29.  First live birth at age 3.  OCP use for approximately 20 years.  Ovaries intact: no.  Hysterectomy: yes.  Menopausal status: postmenopausal.  HRT use: 1 years. Colonoscopy: no; not examined. Mammogram within the last year: yes.  Past Medical History:  Diagnosis Date  . Cataract   . Family history of breast cancer   . Family history of stomach cancer   . Hypercholesteremia   . Hypertension   . Major depressive disorder   . 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 Vitamin D deficiency     Past Surgical History:  Procedure Laterality Date  . ABDOMINAL HYSTERECTOMY  1995  . APPENDECTOMY  1994  . BREAST BIOPSY Right 1998   neg  . BREAST BIOPSY Left 05/30/2020   stere bx x clip path pending  . ESI     . MICRODISCECTOMY LUMBAR  2008   L4-5  . SQUAMOUS CELL CARCINOMA EXCISION    . TONSILLECTOMY  1971    Social History   Socioeconomic History  . Marital status: Widowed    Spouse name: Not on file  . Number of children: 1  . Years of education: Not on file  . Highest education level: Bachelor's degree (e.g., BA, AB, BS)  Occupational History  . Occupation: retired  Tobacco Use  . Smoking status: Former Smoker    Packs/day: 0.91    Years: 33.00    Pack years: 30.03    Types: Cigarettes    Quit date: 04/19/2018    Years since quitting: 2.1  . Smokeless tobacco: Never Used  Vaping Use  . Vaping Use: Never used  Substance and Sexual Activity  . Alcohol use: Yes    Comment: Rare- maybe once year  . Drug use: No  . Sexual activity: Not Currently  Other Topics Concern  . Not on file  Social History Narrative  . Not on file   Social Determinants of Health   Financial Resource Strain: Not on file  Food Insecurity: Not on file  Transportation Needs: Not on file  Physical Activity: Not on file  Stress: Not on file  Social Connections: Not on file     FAMILY HISTORY:  We obtained a detailed, 4-generation  family history.  Significant diagnoses are listed below: Family History  Problem Relation Age of Onset  . Breast cancer Mother 35  . Osteosarcoma Mother        d. 64  . Hypertension Brother   . COPD Father   . Hypertension Father   . Breast cancer Paternal Aunt        dx 19s  . Breast cancer Paternal Grandmother        dx 30s  . Stomach cancer Paternal Grandfather        dx 52s  . Breast cancer Paternal Aunt        dx 41s  . Brain cancer Paternal Aunt        dx 10s   Victoria Dean has 1 daughter, Victoria Dean, age 39. She has 2 brothers and 1 sister, no cancers.   Victoria Dean mother had breast cancer at 105, osteosarcoma also in her 19s, and died at 30. Patient had 2 maternal uncles, 3 maternal aunts. One maternal cousin had cancer, unknown type. Maternal grandmother  passed in her 57s, grandfather passed in his 58s.   Victoria Dean father died at 60. Patient had 2 paternal uncles, 2 paternal aunts. One aunt had breast cancer in her 38s and is living at 63. Another aunt had breast cancer in her 34s and brain cancer and died in her 36s. A paternal cousin had breast cancer in her 57s. Paternal grandmother had breast cancer in her 38s and grandfather had stomach cancer in his 78s.   Victoria Dean is Victoria Dean of previous family history of genetic testing for hereditary cancer risks. Patient's maternal ancestors are of English/Irish descent, and paternal ancestors are of [place] descent. There is no reported Ashkenazi Jewish ancestry. There is no known consanguinity.     GENETIC COUNSELING ASSESSMENT: Victoria Dean. Victoria Dean is a 70 y.o. female with a personal and family history of breast cancer which is somewhat suggestive of a hereditary cancer syndrome and predisposition to cancer. She had genetic testing for this that came back recently. We, therefore, discussed and recommended the following at today's visit.   DISCUSSION: We discussed that approximately 5-10% of breast cancer is hereditary  Most cases of hereditary breast cancer are associated with BRCA1/BRCA2 genes, although there are other genes associated with hereditary cancer as well. We discussed that testing is beneficial for several reasons including surgical decision-making for breast cancer, knowing about other cancer risks, identifying potential screening and risk-reduction options that may be appropriate, and to understand if other family members could be at risk for cancer and allow them to undergo genetic testing.   We reviewed the characteristics, features and inheritance patterns of hereditary cancer syndromes. We also discussed genetic testing, including the appropriate family members to test, the process of testing, insurance coverage and turn-around-time for results. We discussed the implications of a negative, positive  and/or variant of uncertain significant result. Victoria Dean had genetic testing for the [facility] Breast Cancer STAT Panel + Multi-Cancer Panel.   The STAT Breast cancer panel offered by [facility] includes sequencing and rearrangement analysis for the following 9 genes:  ATM, BRCA1, BRCA2, CDH1, CHEK2, PALB2, PTEN, STK11 and TP53.    The Multi-Cancer Panel + RNA offered by [facility] includes sequencing and/or deletion duplication testing of the following 84 genes: AIP, ALK, APC, ATM, AXIN2,BAP1,  BARD1, BLM, BMPR1A, BRCA1, BRCA2, BRIP1, CASR, CDC73, CDH1, CDK4, CDKN1B, CDKN1C, CDKN2A (p14ARF), CDKN2A (p16INK4a), CEBPA, CHEK2, CTNNA1, DICER1, DIS3L2, EGFR (c.2369C>T, p.Thr790Met variant only), EPCAM (Deletion/duplication testing only),  FH, FLCN, GATA2, GPC3, GREM1 (Promoter region deletion/duplication testing only), HOXB13 (c.251G>A, p.Gly84Glu), HRAS, KIT, MAX, MEN1, MET, MITF (c.952G>A, p.Glu318Lys variant only), MLH1, MSH2, MSH3, MSH6, MUTYH, NBN, NF1, NF2, NTHL1, PALB2, PDGFRA, PHOX2B, PMS2, POLD1, POLE, POT1, PRKAR1A, PTCH1, PTEN, RAD50, RAD51C, RAD51D, RB1, RECQL4, RET, RUNX1, SDHAF2, SDHA (sequence changes only), SDHB, SDHC, SDHD, SMAD4, SMARCA4, SMARCB1, SMARCE1, STK11, SUFU, TERC, TERT, TMEM127, TP53, TSC1, TSC2, VHL, WRN and WT1.  GENETIC TEST RESULTS: Genetic testing reported out on 06/16/2020 through the [facility] Breast Cancer STAT Panel + Multi- cancer panel found no pathogenic mutations. .   The test report has been scanned into EPIC and is located under the Molecular Pathology section of the Results Review tab.  A portion of the result report is included below for reference.     We discussed with Victoria Dean that because current genetic testing is not perfect, it is possible there may be a gene mutation in one of these genes that current testing cannot detect, but that chance is small.  We also discussed, that there could be another gene that has not yet been discovered, or that we have not yet tested,  that is responsible for the cancer diagnoses in the family. It is also possible there is a hereditary cause for the cancer in the family that Victoria Dean did not inherit and therefore was not identified in her testing.  Therefore, it is important to remain in touch with cancer genetics in the future so that we can continue to offer Victoria Dean the most up to date genetic testing.   Genetic testing did identify a variant of uncertain significance (VUS) in the MSH3 gene called c.2044G>C.  At this time, it is unknown if this variant is associated with increased cancer risk or if this is a normal finding, but most variants such as this get reclassified to being inconsequential. It should not be used to make medical management decisions. With time, we suspect the lab will determine the significance of this variant, if any. If we do learn more about it, we will try to contact Victoria Dean to discuss it further. However, it is important to stay in touch with [place] periodically and keep the address and phone number up to date.  ADDITIONAL GENETIC TESTING: We discussed with Victoria Dean that her genetic testing was fairly extensive.  If there are genes identified to increase cancer risk that can be analyzed in the future, we would be happy to discuss and coordinate this testing at that time.    CANCER SCREENING RECOMMENDATIONS: Victoria Dean test result is considered negative (normal).  This means that we have not identified a hereditary cause for her  personal and family history of cancer at this time. Most cancers happen by chance and this negative test suggests that her cancer may fall into this category.    While reassuring, this does not definitively rule out a hereditary predisposition to cancer. It is still possible that there could be genetic mutations that are undetectable by current technology. There could be genetic mutations in genes that have not been tested or identified to increase cancer risk.  Therefore, it is  recommended she continue to follow the cancer management and screening guidelines provided by her oncology and primary healthcare provider.   An individual's cancer risk and medical management are not determined by genetic test results alone. Overall cancer risk assessment incorporates additional factors, including personal medical history, family history, and any available genetic information that may result in a  personalized plan for cancer prevention and surveillance.  RECOMMENDATIONS FOR FAMILY MEMBERS:  Relatives in this family might be at some increased risk of developing cancer, over the general population risk, simply due to the family history of cancer.  We recommended female relatives in this family have a yearly mammogram beginning at age 40, or 35 years younger than the earliest onset of cancer, an annual clinical breast exam, and perform monthly breast self-exams. Female relatives in this family should also have a gynecological exam as recommended by their primary provider.  All family members should be referred for colonoscopy starting at age 60.    It is also possible there is a hereditary cause for the cancer in Victoria Dean's family that she did not inherit and therefore was not identified in her.  Based on Victoria Dean's family history, we recommended maternal and paternal relatives have genetic counseling and testing. Victoria Dean. Roma will let us know if we can be of any assistance in coordinating genetic counseling and/or testing for these family members.  FOLLOW-UP: Lastly, we discussed with Victoria Dean. Bezek that cancer genetics is a rapidly advancing field and it is possible that new genetic tests will be appropriate for her and/or her family members in the future. We encouraged her to remain in contact with cancer genetics on an annual basis so we can update her personal and family histories and let her know of advances in cancer genetics that may benefit this family.   Our contact number was provided.  Victoria Dean. Amezcua questions were answered to her satisfaction, and she knows she is welcome to call us at anytime with additional questions or concerns.   Faith Rogue, Victoria Dean, [facility] Genetic Counselor [email or web address] Phone: [phone]  The patient was seen for a total of 30 minutes in face-to-face genetic counseling. Patient's daughter Victoria Dean was also present.  Dr. Grayland Ormond was available for discussion regarding this case.   _______________________________________________________________________ For Office Staff:  Number of people involved in session: 2 Was an Intern/ student involved with case: no

## 2020-06-27 ENCOUNTER — Other Ambulatory Visit
Admission: RE | Admit: 2020-06-27 | Discharge: 2020-06-27 | Disposition: A | Discharge status: Home / Self Care | Payer: Medicare PPO | Source: Ambulatory Visit | Attending: General Surgery | Admitting: General Surgery

## 2020-06-27 ENCOUNTER — Other Ambulatory Visit: Payer: Self-pay

## 2020-06-27 HISTORY — DX: Ventricular premature depolarization: I49.3

## 2020-06-27 HISTORY — DX: Anemia, unspecified: D64.9

## 2020-06-27 HISTORY — DX: Emphysema, unspecified: J43.9

## 2020-06-27 NOTE — Patient Instructions (Addendum)
Your procedure is scheduled on:  Friday, May 27 Report to the Registration Desk on the 1st floor of the [facility]. To find out your arrival time, please call [phone] between 1PM - 3PM on: Thursday, May 26  REMEMBER: Instructions that are not followed completely may result in serious medical risk, up to and including death; or upon the discretion of your surgeon and anesthesiologist your surgery may need to be rescheduled.  Do not eat food after midnight the night before surgery.  No gum chewing, lozengers or hard candies.  You may however, drink CLEAR liquids up to 2 hours before you are scheduled to arrive for your surgery. Do not drink anything within 2 hours of your scheduled arrival time.  Clear liquids include: - water  - apple juice without pulp - gatorade (not RED, PURPLE, OR BLUE) - black coffee or tea (Do NOT add milk or creamers to the coffee or tea) Do NOT drink anything that is not on this list.  DO NOT TAKE ANY MEDICATIONS THE MORNING OF SURGERY   One week prior to surgery: STARTING TODAY, MAY 25 Stop Anti-inflammatories (NSAIDS) such as Advil, Aleve, Ibuprofen, Motrin, Naproxen, Naprosyn and Aspirin based products such as Excedrin, Goodys Powder, BC Powder. Stop ANY OVER THE COUNTER supplements until after surgery. You may however, continue to take Tylenol if needed for pain up until the day of surgery.  No Alcohol for 24 hours before or after surgery.  No Smoking including e-cigarettes for 24 hours prior to surgery.  No chewable tobacco products for at least 6 hours prior to surgery.  No nicotine patches on the day of surgery.  On the morning of surgery brush your teeth with toothpaste and water, you may rinse your mouth with mouthwash if you wish. Do not swallow any toothpaste or mouthwash.  Do not wear jewelry, make-up, hairpins, clips or nail polish.  Do not wear lotions, powders, or perfumes.   Do not shave body from the neck down 48 hours prior to  surgery just in case you cut yourself which could leave a site for infection.  Also, freshly shaved skin may become irritated if using the CHG soap.  Contact lenses, hearing aids and dentures may not be worn into surgery.  Do not bring valuables to the hospital. [facility] is not responsible for any missing/lost belongings or valuables.   Use CHG Soap as directed on instruction sheet.  Notify your doctor if there is any change in your medical condition (cold, fever, infection).  Wear comfortable clothing (specific to your surgery type) to the hospital.  Plan for stool softeners for home use; pain medications have a tendency to cause constipation. You can also help prevent constipation by eating foods high in fiber such as fruits and vegetables and drinking plenty of fluids as your diet allows.  After surgery, you can help prevent lung complications by doing breathing exercises.  Take deep breaths and cough every 1-2 hours. Your doctor may order a device called an Incentive Spirometer to help you take deep breaths.  If you are being discharged the day of surgery, you will not be allowed to drive home. You will need a responsible adult (18 years or older) to drive you home and stay with you that night.   If you are taking public transportation, you will need to have a responsible adult (18 years or older) with you. Please confirm with your physician that it is acceptable to use public transportation.   Please  call the [facility] at [phone] if you have any questions about these instructions.  Surgery Visitation Policy:  Patients undergoing a surgery or procedure may have one family member or support person with them as long as that person is not COVID-19 positive or experiencing its symptoms.  That person may remain in the waiting area during the procedure.

## 2020-06-28 ENCOUNTER — Encounter
Admission: RE | Admit: 2020-06-28 | Discharge: 2020-06-28 | Disposition: A | Discharge status: Home / Self Care | Payer: Medicare PPO | Source: Ambulatory Visit | Attending: General Surgery | Admitting: General Surgery

## 2020-06-28 DIAGNOSIS — I10 Essential (primary) hypertension: Secondary | ICD-10-CM | POA: Insufficient documentation

## 2020-06-28 DIAGNOSIS — Z01810 Encounter for preprocedural cardiovascular examination: Secondary | ICD-10-CM | POA: Diagnosis not present

## 2020-06-28 DIAGNOSIS — Z01818 Encounter for other preprocedural examination: Secondary | ICD-10-CM | POA: Diagnosis not present

## 2020-06-28 MED ORDER — CHLORHEXIDINE GLUCONATE CLOTH 2 % EX PADS: 6.0000 | MEDICATED_PAD | Freq: Once | CUTANEOUS | Status: DC

## 2020-06-28 MED ORDER — ORAL CARE MOUTH RINSE: 15.0000 mL | Freq: Once | OROMUCOSAL | Status: AC

## 2020-06-28 MED ORDER — CHLORHEXIDINE GLUCONATE CLOTH 2 % EX PADS
6.0000 | MEDICATED_PAD | Freq: Once | CUTANEOUS | Status: DC
Start: 2020-06-28 — End: 2020-06-29

## 2020-06-28 MED ORDER — CHLORHEXIDINE GLUCONATE 0.12 % MT SOLN: 15.0000 mL | Freq: Once | OROMUCOSAL | Status: AC

## 2020-06-28 MED ORDER — LACTATED RINGERS IV SOLN: INTRAVENOUS | Status: DC

## 2020-06-28 MED ORDER — CEFAZOLIN SODIUM-DEXTROSE 2-4 GM/100ML-% IV SOLN
2.0000 g | INTRAVENOUS | Status: AC
  Administered 2020-06-29: 2 g via INTRAVENOUS

## 2020-06-28 MED ORDER — FAMOTIDINE 20 MG PO TABS: 20.0000 mg | ORAL_TABLET | Freq: Once | ORAL | Status: AC

## 2020-06-29 ENCOUNTER — Ambulatory Visit
Admission: RE | Admit: 2020-06-29 | Discharge: 2020-06-29 | Disposition: A | Discharge status: Home / Self Care | Payer: Medicare PPO | Source: Ambulatory Visit | Attending: General Surgery | Admitting: General Surgery

## 2020-06-29 ENCOUNTER — Ambulatory Visit: Payer: Medicare PPO | Admitting: Urgent Care

## 2020-06-29 ENCOUNTER — Ambulatory Visit
Admission: RE | Admit: 2020-06-29 | Discharge: 2020-06-29 | Disposition: A | Discharge status: Home / Self Care | Payer: Medicare PPO | Attending: General Surgery | Admitting: General Surgery

## 2020-06-29 ENCOUNTER — Encounter: Payer: Self-pay | Admitting: General Surgery

## 2020-06-29 ENCOUNTER — Encounter
Admission: RE | Disposition: A | Discharge status: Home / Self Care | Payer: Self-pay | Source: Home / Self Care | Attending: General Surgery

## 2020-06-29 DIAGNOSIS — Z79899 Other long term (current) drug therapy: Secondary | ICD-10-CM | POA: Insufficient documentation

## 2020-06-29 DIAGNOSIS — E7800 Pure hypercholesterolemia, unspecified: Secondary | ICD-10-CM | POA: Diagnosis not present

## 2020-06-29 DIAGNOSIS — C50412 Malignant neoplasm of upper-outer quadrant of left female breast: Secondary | ICD-10-CM

## 2020-06-29 DIAGNOSIS — Z85828 Personal history of other malignant neoplasm of skin: Secondary | ICD-10-CM | POA: Insufficient documentation

## 2020-06-29 DIAGNOSIS — C50912 Malignant neoplasm of unspecified site of left female breast: Secondary | ICD-10-CM | POA: Diagnosis not present

## 2020-06-29 DIAGNOSIS — Z882 Allergy status to sulfonamides status: Secondary | ICD-10-CM | POA: Insufficient documentation

## 2020-06-29 DIAGNOSIS — Z888 Allergy status to other drugs, medicaments and biological substances status: Secondary | ICD-10-CM | POA: Diagnosis not present

## 2020-06-29 DIAGNOSIS — R928 Other abnormal and inconclusive findings on diagnostic imaging of breast: Secondary | ICD-10-CM | POA: Diagnosis not present

## 2020-06-29 DIAGNOSIS — Z6841 Body Mass Index (BMI) 40.0 and over, adult: Secondary | ICD-10-CM | POA: Diagnosis not present

## 2020-06-29 DIAGNOSIS — C773 Secondary and unspecified malignant neoplasm of axilla and upper limb lymph nodes: Secondary | ICD-10-CM | POA: Diagnosis not present

## 2020-06-29 DIAGNOSIS — Z87891 Personal history of nicotine dependence: Secondary | ICD-10-CM | POA: Diagnosis not present

## 2020-06-29 DIAGNOSIS — Z800 Family history of malignant neoplasm of digestive organs: Secondary | ICD-10-CM | POA: Insufficient documentation

## 2020-06-29 DIAGNOSIS — Z803 Family history of malignant neoplasm of breast: Secondary | ICD-10-CM | POA: Diagnosis not present

## 2020-06-29 HISTORY — PX: BREAST LUMPECTOMY WITH SENTINEL LYMPH NODE BIOPSY: SHX5597

## 2020-06-29 SURGERY — BREAST LUMPECTOMY WITH SENTINEL LYMPH NODE BX
Anesthesia: General | Laterality: Left

## 2020-06-29 MED ORDER — LIDOCAINE HCL (CARDIAC) PF 100 MG/5ML IV SOSY
PREFILLED_SYRINGE | INTRAVENOUS | Status: DC | PRN
  Administered 2020-06-29: 80 mg via INTRAVENOUS

## 2020-06-29 MED ORDER — HYDROCODONE-ACETAMINOPHEN 5-325 MG PO TABS: 1.0000 | ORAL_TABLET | ORAL | 0 refills | Status: DC | PRN

## 2020-06-29 MED ORDER — FAMOTIDINE 20 MG PO TABS
ORAL_TABLET | ORAL | Status: AC
  Administered 2020-06-29: 20 mg via ORAL
  Filled 2020-06-29: qty 1

## 2020-06-29 MED ORDER — CHLORHEXIDINE GLUCONATE 0.12 % MT SOLN
OROMUCOSAL | Status: AC
  Administered 2020-06-29: 15 mL via OROMUCOSAL
  Filled 2020-06-29: qty 15

## 2020-06-29 MED ORDER — KETOROLAC TROMETHAMINE 30 MG/ML IJ SOLN
INTRAMUSCULAR | Status: DC | PRN
  Administered 2020-06-29: 15 mg via INTRAVENOUS

## 2020-06-29 MED ORDER — DEXAMETHASONE SODIUM PHOSPHATE 10 MG/ML IJ SOLN
INTRAMUSCULAR | Status: AC
  Filled 2020-06-29: qty 1

## 2020-06-29 MED ORDER — TECHNETIUM TC 99M TILMANOCEPT KIT
1.1400 | PACK | Freq: Once | INTRAVENOUS | Status: AC | PRN
  Administered 2020-06-29: 1.14 via INTRADERMAL

## 2020-06-29 MED ORDER — ONDANSETRON HCL 4 MG/2ML IJ SOLN
INTRAMUSCULAR | Status: AC
  Filled 2020-06-29: qty 2

## 2020-06-29 MED ORDER — ACETAMINOPHEN 10 MG/ML IV SOLN
INTRAVENOUS | Status: DC | PRN
  Administered 2020-06-29: 1000 mg via INTRAVENOUS

## 2020-06-29 MED ORDER — BUPIVACAINE-EPINEPHRINE (PF) 0.5% -1:200000 IJ SOLN
INTRAMUSCULAR | Status: DC | PRN
  Administered 2020-06-29: 30 mL via PERINEURAL

## 2020-06-29 MED ORDER — PROPOFOL 10 MG/ML IV BOLUS
INTRAVENOUS | Status: AC
  Filled 2020-06-29: qty 20

## 2020-06-29 MED ORDER — ACETAMINOPHEN 10 MG/ML IV SOLN
INTRAVENOUS | Status: AC
  Filled 2020-06-29: qty 100

## 2020-06-29 MED ORDER — FENTANYL CITRATE (PF) 100 MCG/2ML IJ SOLN
INTRAMUSCULAR | Status: DC | PRN
  Administered 2020-06-29 (×2): 50 ug via INTRAVENOUS

## 2020-06-29 MED ORDER — FENTANYL CITRATE (PF) 100 MCG/2ML IJ SOLN
INTRAMUSCULAR | Status: AC
  Administered 2020-06-29: 25 ug via INTRAVENOUS
  Filled 2020-06-29: qty 2

## 2020-06-29 MED ORDER — METHYLENE BLUE 0.5 % INJ SOLN
INTRAVENOUS | Status: AC
  Filled 2020-06-29: qty 10

## 2020-06-29 MED ORDER — KETOROLAC TROMETHAMINE 30 MG/ML IJ SOLN
INTRAMUSCULAR | Status: AC
  Filled 2020-06-29: qty 1

## 2020-06-29 MED ORDER — PROPOFOL 10 MG/ML IV BOLUS
INTRAVENOUS | Status: DC | PRN
  Administered 2020-06-29: 150 mg via INTRAVENOUS

## 2020-06-29 MED ORDER — BUPIVACAINE-EPINEPHRINE (PF) 0.5% -1:200000 IJ SOLN
INTRAMUSCULAR | Status: AC
  Filled 2020-06-29: qty 30

## 2020-06-29 MED ORDER — LIDOCAINE HCL (PF) 2 % IJ SOLN
INTRAMUSCULAR | Status: AC
  Filled 2020-06-29: qty 4

## 2020-06-29 MED ORDER — MIDAZOLAM HCL 2 MG/2ML IJ SOLN
INTRAMUSCULAR | Status: AC
  Filled 2020-06-29: qty 2

## 2020-06-29 MED ORDER — DEXMEDETOMIDINE (PRECEDEX) IN NS 20 MCG/5ML (4 MCG/ML) IV SYRINGE
PREFILLED_SYRINGE | INTRAVENOUS | Status: DC | PRN
  Administered 2020-06-29: 4 ug via INTRAVENOUS
  Administered 2020-06-29: 8 ug via INTRAVENOUS
  Administered 2020-06-29 (×2): 4 ug via INTRAVENOUS

## 2020-06-29 MED ORDER — ROCURONIUM BROMIDE 100 MG/10ML IV SOLN
INTRAVENOUS | Status: DC | PRN
  Administered 2020-06-29: 50 mg via INTRAVENOUS
  Administered 2020-06-29: 20 mg via INTRAVENOUS

## 2020-06-29 MED ORDER — ONDANSETRON HCL 4 MG/2ML IJ SOLN
INTRAMUSCULAR | Status: DC | PRN
  Administered 2020-06-29: 4 mg via INTRAVENOUS

## 2020-06-29 MED ORDER — PHENYLEPHRINE HCL (PRESSORS) 10 MG/ML IV SOLN
INTRAVENOUS | Status: DC | PRN
  Administered 2020-06-29: 100 ug via INTRAVENOUS

## 2020-06-29 MED ORDER — ONDANSETRON HCL 4 MG/2ML IJ SOLN: 4.0000 mg | Freq: Once | INTRAMUSCULAR | Status: DC | PRN

## 2020-06-29 MED ORDER — CEFAZOLIN SODIUM-DEXTROSE 2-4 GM/100ML-% IV SOLN
INTRAVENOUS | Status: AC
  Filled 2020-06-29: qty 100

## 2020-06-29 MED ORDER — SUGAMMADEX SODIUM 500 MG/5ML IV SOLN
INTRAVENOUS | Status: DC | PRN
  Administered 2020-06-29: 200 mg via INTRAVENOUS

## 2020-06-29 MED ORDER — FENTANYL CITRATE (PF) 100 MCG/2ML IJ SOLN
25.0000 ug | INTRAMUSCULAR | Status: DC | PRN
Start: 2020-06-29 — End: 2020-06-29
  Administered 2020-06-29 (×3): 25 ug via INTRAVENOUS

## 2020-06-29 MED ORDER — FENTANYL CITRATE (PF) 100 MCG/2ML IJ SOLN
INTRAMUSCULAR | Status: AC
  Filled 2020-06-29: qty 2

## 2020-06-29 MED ORDER — DEXMEDETOMIDINE (PRECEDEX) IN NS 20 MCG/5ML (4 MCG/ML) IV SYRINGE
PREFILLED_SYRINGE | INTRAVENOUS | Status: AC
  Filled 2020-06-29: qty 5

## 2020-06-29 MED ORDER — DEXAMETHASONE SODIUM PHOSPHATE 10 MG/ML IJ SOLN
INTRAMUSCULAR | Status: DC | PRN
  Administered 2020-06-29: 10 mg via INTRAVENOUS

## 2020-06-29 MED ORDER — MIDAZOLAM HCL 2 MG/2ML IJ SOLN
INTRAMUSCULAR | Status: DC | PRN
  Administered 2020-06-29 (×2): .5 mg via INTRAVENOUS

## 2020-06-29 SURGICAL SUPPLY — 47 items
APL PRP STRL LF DISP 70% ISPRP (MISCELLANEOUS) ×1
BLADE BOVIE TIP EXT 4 (BLADE) ×1 IMPLANT
BLADE SURG 15 STRL SS SAFETY (BLADE) ×4 IMPLANT
CANISTER SUCT 1200ML W/VALVE (MISCELLANEOUS) ×2 IMPLANT
CHLORAPREP W/TINT 26 (MISCELLANEOUS) ×2 IMPLANT
COVER PROBE FLX POLY STRL (MISCELLANEOUS) ×2 IMPLANT
COVER WAND RF STERILE (DRAPES) ×2 IMPLANT
DEVICE DUBIN SPECIMEN MAMMOGRA (MISCELLANEOUS) ×2 IMPLANT
DRAPE LAPAROTOMY TRNSV 106X77 (MISCELLANEOUS) ×2 IMPLANT
DRSG GAUZE FLUFF 36X18 (GAUZE/BANDAGES/DRESSINGS) ×4 IMPLANT
DRSG TELFA 3X8 NADH (GAUZE/BANDAGES/DRESSINGS) ×2 IMPLANT
ELECT CAUTERY BLADE TIP 2.5 (TIP) ×2
ELECT REM PT RETURN 9FT ADLT (ELECTROSURGICAL) ×2
ELECTRODE CAUTERY BLDE TIP 2.5 (TIP) ×1 IMPLANT
ELECTRODE REM PT RTRN 9FT ADLT (ELECTROSURGICAL) ×1 IMPLANT
GLOVE SURG ENC MOIS LTX SZ7.5 (GLOVE) ×2 IMPLANT
GLOVE SURG UNDER LTX SZ8 (GLOVE) ×2 IMPLANT
GOWN STRL REUS W/ TWL LRG LVL3 (GOWN DISPOSABLE) ×2 IMPLANT
GOWN STRL REUS W/TWL LRG LVL3 (GOWN DISPOSABLE) ×4
KIT TURNOVER KIT A (KITS) ×2 IMPLANT
MANIFOLD NEPTUNE II (INSTRUMENTS) ×2 IMPLANT
MARGIN MAP 10MM (MISCELLANEOUS) ×2 IMPLANT
NDL HYPO 25X1 1.5 SAFETY (NEEDLE) ×2 IMPLANT
NDL SPNL 20GX3.5 QUINCKE YW (NEEDLE) IMPLANT
NEEDLE HYPO 22GX1.5 SAFETY (NEEDLE) ×2 IMPLANT
NEEDLE HYPO 25X1 1.5 SAFETY (NEEDLE) ×4 IMPLANT
NEEDLE SPNL 20GX3.5 QUINCKE YW (NEEDLE) ×2 IMPLANT
PACK BASIN MINOR ARMC (MISCELLANEOUS) ×2 IMPLANT
PAD ABD DERMACEA PRESS 5X9 (GAUZE/BANDAGES/DRESSINGS) ×2 IMPLANT
PAD DRESSING TELFA 3X8 NADH (GAUZE/BANDAGES/DRESSINGS) ×1 IMPLANT
SLEVE PROBE SENORX GAMMA FIND (MISCELLANEOUS) ×1 IMPLANT
STRIP CLOSURE SKIN 1/2X4 (GAUZE/BANDAGES/DRESSINGS) ×2 IMPLANT
SUT ETHILON 3-0 FS-10 30 BLK (SUTURE) ×2
SUT SILK 2 0 (SUTURE) ×2
SUT SILK 2-0 18XBRD TIE 12 (SUTURE) ×1 IMPLANT
SUT VIC AB 2-0 CT1 27 (SUTURE) ×8
SUT VIC AB 2-0 CT1 TAPERPNT 27 (SUTURE) ×2 IMPLANT
SUT VIC AB 3-0 SH 27 (SUTURE) ×2
SUT VIC AB 3-0 SH 27X BRD (SUTURE) ×2 IMPLANT
SUT VIC AB 4-0 FS2 27 (SUTURE) ×3 IMPLANT
SUT VICRYL+ 3-0 144IN (SUTURE) ×2 IMPLANT
SUTURE EHLN 3-0 FS-10 30 BLK (SUTURE) ×1 IMPLANT
SWABSTK COMLB BENZOIN TINCTURE (MISCELLANEOUS) ×2 IMPLANT
SYR 10ML LL (SYRINGE) ×2 IMPLANT
SYR BULB IRRIG 60ML STRL (SYRINGE) ×2 IMPLANT
TAPE TRANSPORE STRL 2 31045 (GAUZE/BANDAGES/DRESSINGS) ×2 IMPLANT
WATER STERILE IRR 1000ML POUR (IV SOLUTION) ×2 IMPLANT

## 2020-06-29 NOTE — Op Note (Signed)
Preoperative diagnosis: Invasive lobular carcinoma of the left breast, desire for breast conservation.  Postoperative diagnosis: Same.  Operative procedure: 1) interpectoral block; 2) left breast wide excision with tissue transfer (greater than 15 cm) sentinel node biopsy.  Operating Surgeon: Hervey Ard, MD.  Anesthesia: General endotracheal, Marcaine 0.5% with 1: 200,000 units of epinephrine, 30 cc.  Estimated blood loss: Less than 10 cc.  Clinical note: This 70 year old woman recently noticed nipple retraction and subsequent mammogram showed distortion in the upper outer quadrant left breast.  Biopsy showed evidence changes consistent with invasive mammary carcinoma with lobular features.  MRI showed a 4 x 6 x 8 cm area in the r left upper outer quadrant.  The patient desired breast conservation.  As the malignant process extended into the retroareolar area she was aware that this would necessitate sacrifice of the nipple areolar complex.  The patient underwent injection with technetium sulfur colloid prior to presentation to the operating theater.  She received antibiotics prophylaxis with Ancef based on age.  SCD stockings for DVT prevention.  Operative note: The patient underwent general endotracheal anesthesia without difficulty.  The breast was prepped with ChloraPrep and draped.  Making use of ultrasound interpectoral block was placed with 20 cc of the above-mentioned local anesthetic.  Image captured for permanent chart review.  The tumor mass in the upper outer quadrant of the left breast was identified with ultrasound, image captured for permanent chart review.  It was elected to make use of an elliptical incision based around the areola and extending towards the upper outer quadrant at the 2 o'clock position.  This would encompass the main tumor mass with minimal undermining.  The last 10 cc of local anesthetic was infiltrated around the nipple areolar complex.  An ellipse of skin  to encompass the nipple areolar complex was removed and then a radial extension in the 2 o'clock position.  The skin was incised sharply and remaining dissection completed with electrocautery.  Once the subcutaneous fat was exposed cervical dilators were used to develop planes of dissection superior medially and inferior laterally.  These were then elevated with cautery to encompass a 6 x 8 cm block of tissue including the retroareolar breast parenchyma.  The skin was widely mobilized and the block of tissue was excised down to the pectoralis fascia with a portion of the fascia removed.  The specimen was orientated and specimen radiograph concluded the previously placed X clip.  The specimen was sent to pathology and subsequent phone report showed no gross evidence of positive margins.  While the breast was being processed attention was turned to the axilla.  It was found it was possible to reach the sentinel nodes from the original wide excision site.  The lateral fibers of the pectoralis major muscle were split to provide good exposure and 3 hot nodes with counts of 7000 4000 and 1000 were removed.  Hemostasis was electrocautery and was good.  The split in the pectoralis muscle was closed with a running 2-0 Vicryl suture.  The breast parenchyma was then elevated off the underlying muscle circumferentially to the serratus laterally just above the rectus inferiorly and well into the medial aspect of the breast.  The deep tissue including the pectoralis fashion was then approximated with interrupted 2-0 Vicryl figure-of-eight sutures.  The second layer brought the majority of the residual breast parenchyma together.  The final layer was brought to the subcutaneous fat.  There was a need to mobilize little bit more of the skin  to make a smooth closure.  The skin itself was closed with a running 4-0 Vicryl subcuticular suture.  Benzoin and Steri-Strips followed by Telfa pad, fluff gauze and a compressive wrap were  applied.  The patient tolerated the procedure well and was taken recovery in stable condition.

## 2020-06-29 NOTE — Anesthesia Preprocedure Evaluation (Addendum)
Anesthesia Evaluation  Patient identified by MRN, date of birth, ID band Patient awake    Reviewed: Allergy & Precautions, NPO status , Patient's Chart, lab work & pertinent test results  History of Anesthesia Complications Negative for: history of anesthetic complications  Airway Mallampati: II       Dental   Pulmonary neg sleep apnea, COPD (no inhalers), Not current smoker, former smoker,           Cardiovascular hypertension, Pt. on medications (-) Past MI and (-) CHF (-) pacemaker(-) Valvular Problems/Murmurs     Neuro/Psych neg Seizures Anxiety Depression    GI/Hepatic Neg liver ROS, neg GERD  ,  Endo/Other  neg diabetesMorbid obesity  Renal/GU negative Renal ROS     Musculoskeletal   Abdominal   Peds  Hematology   Anesthesia Other Findings   Reproductive/Obstetrics                            Anesthesia Physical Anesthesia Plan  ASA: III  Anesthesia Plan: General   Post-op Pain Management:    Induction: Intravenous  PONV Risk Score and Plan: 3 and Ondansetron and Dexamethasone  Airway Management Planned: Oral ETT  Additional Equipment:   Intra-op Plan:   Post-operative Plan:   Informed Consent: I have reviewed the patients History and Physical, chart, labs and discussed the procedure including the risks, benefits and alternatives for the proposed anesthesia with the patient or authorized representative who has indicated his/her understanding and acceptance.       Plan Discussed with:   Anesthesia Plan Comments:         Anesthesia Quick Evaluation

## 2020-06-29 NOTE — Transfer of Care (Signed)
Immediate Anesthesia Transfer of Care Note  Patient: Victoria Dean  Procedure(s) Performed: BREAST LUMPECTOMY WITH SENTINEL LYMPH NODE BX (Left )  Patient Location: PACU  Anesthesia Type:General  Level of Consciousness: drowsy and patient cooperative  Airway & Oxygen Therapy: Patient Spontanous Breathing and Patient connected to face mask oxygen  Post-op Assessment: Report given to RN and Post -op Vital signs reviewed and stable  Post vital signs: Reviewed and stable  Last Vitals:  Vitals Value Taken Time  BP 162/98 06/29/20 1202  Temp    Pulse 94 06/29/20 1206  Resp 20 06/29/20 1206  SpO2 100 % 06/29/20 1206  Vitals shown include unvalidated device data.  Last Pain:  Vitals:   06/29/20 0820  TempSrc: Temporal  PainSc: 0-No pain         Complications: No complications documented.

## 2020-06-29 NOTE — Anesthesia Procedure Notes (Signed)
Procedure Name: Intubation Date/Time: 06/29/2020 9:39 AM Performed by: Daiva Huge, RN Pre-anesthesia Checklist: Patient identified, Emergency Drugs available, Suction available and Patient being monitored Patient Re-evaluated:Patient Re-evaluated prior to induction Oxygen Delivery Method: Circle system utilized Preoxygenation: Pre-oxygenation with 100% oxygen Induction Type: IV induction Ventilation: Mask ventilation without difficulty Laryngoscope Size: McGraph and 3 Grade View: Grade II Tube type: Oral Tube size: 6.5 mm Number of attempts: 1 Airway Equipment and Method: Stylet and Oral airway Placement Confirmation: ETT inserted through vocal cords under direct vision,  positive ETCO2 and breath sounds checked- equal and bilateral Secured at: 21 cm Tube secured with: Tape Dental Injury: Teeth and Oropharynx as per pre-operative assessment

## 2020-06-29 NOTE — Discharge Instructions (Signed)
AMBULATORY SURGERY  DISCHARGE INSTRUCTIONS   1) The drugs that you were given will stay in your system until tomorrow so for the next 24 hours you should not:  A) Drive an automobile B) Make any legal decisions C) Drink any alcoholic beverage   2) You may resume regular meals tomorrow.  Today it is better to start with liquids and gradually work up to solid foods.  You may eat anything you prefer, but it is better to start with liquids, then soup and crackers, and gradually work up to solid foods.   3) Please notify your doctor immediately if you have any unusual bleeding, trouble breathing, redness and pain at the surgery site, drainage, fever, or pain not relieved by medication.    Please contact your physician with any problems or Same Day Surgery at [phone], Monday through Friday 6 am to 4 pm, or [place] [facility] number at [phone].   Breast Biopsy, Care After These instructions give you information about caring for yourself after your procedure. Your doctor may also give you more specific instructions. Call your doctor if you have any problems or questions after your procedure. What can I expect after the procedure? After your procedure, it is common to have:  Bruising on your breast.  Numbness, tingling, or pain near your biopsy site. Follow these instructions at home: Medicines  Take over-the-counter and prescription medicines only as told by your doctor.  Do not drive for 24 hours if you were given a medicine to help you relax (sedative) during your procedure.  Do not drink alcohol while taking pain medicine.  Do not drive or use heavy machinery while taking prescription pain medicine. Biopsy site care  Follow instructions from your doctor about how to take care of your cut from surgery (incision) or your puncture area. Make sure you: ? Wash your hands with soap and water before you change your bandage (dressing). If you cannot use soap and  water, use hand sanitizer. ? Change your bandage as told by your doctor. ? Leave stitches (sutures), skin glue, or skin tape (adhesive strips) in place. They may need to stay in place for 2 weeks or longer. If tape strips get loose and curl up, you may trim the loose edges. Do not remove tape strips completely unless your doctor says it is okay.  If you have stitches, keep them dry when you take a bath or a shower.  Check your cut or puncture area every day for signs of infection. Check for: ? Redness, swelling, or pain. ? Fluid or blood. ? Warmth. ? Pus or a bad smell.  Protect the biopsy area. Do not let the area get bumped.      Activity  If you had a cut during your procedure, avoid activities that could pull your cut open. These include: ? Stretching. ? Reaching over your head. ? Exercise. ? Sports. ? Lifting anything that weighs more than 3 lb (1.4 kg).  Return to your normal activities as told by your doctor. Ask your doctor what activities are safe for you. Managing pain, stiffness, and swelling If told, put ice on the biopsy site to relieve swelling:  Put ice in a plastic bag.  Place a towel between your skin and the bag.  Leave the ice on for 20 minutes, 2-3 times a day. General instructions  Continue your normal diet.  Wear a good support bra for as long as told by your doctor.  Get checked for  extra fluid around your lymph nodes (lymphedema) as often as told by your doctor.  Keep all follow-up visits as told by your doctor. This is important. Contact a doctor if:  You notice any of the following at the biopsy site: ? More redness, swelling, or pain. ? More fluid or blood coming from the site. ? The site feels warm to the touch. ? Pus or a bad smell coming from the site. ? The site breaks open after the stitches or skin tape strips have been removed.  You have a rash.  You have a fever. Get help right away if:  You have more bleeding from the biopsy  site. Get help right away if bleeding is more than a small spot.  You have trouble breathing.  You have red streaks around the biopsy site. Summary  After your procedure, it is common to have bruising, numbness, tingling, or pain near the biopsy site.  Do not drive or use heavy machinery while taking prescription pain medicine.  Wear a good support bra for as long as told by your doctor.  If you had a cut during your procedure, avoid activities that may pull the cut open. Ask your doctor what activities are safe for you. This information is not intended to replace advice given to you by your health care provider. Make sure you discuss any questions you have with your health care provider. Document Revised: 10/03/2019 Document Reviewed: 07/09/2017 Elsevier Patient Education  [place].

## 2020-06-29 NOTE — H&P (Signed)
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  She desires breast conservation.  She is admitted for wide excision and sentinel node biopsy.  She is aware that the nipple areolar complex will be sacrificed due to the extent of disease extending into the retroareolar tissue.  Medications Prior to Admission  Medication Sig Dispense Refill Last Dose  . acetaminophen (TYLENOL) 500 MG tablet Take 500-1,000 mg by mouth every 8 (eight) hours as needed for moderate pain.   06/27/2020  . Biotin 1000 MCG tablet Take 1,000 mcg by mouth daily.   06/28/2020 at Unknown time  . Cholecalciferol (VITAMIN D3) 25 MCG (1000 UT) CAPS Take 1,000 Units by mouth daily.   06/28/2020 at Unknown time  . diazepam (VALIUM) 5 MG tablet Take 1 tablet (5 mg total) by mouth at bedtime as needed for anxiety. 30 tablet 5 06/28/2020 at Unknown time  . hydrocortisone 2.5 % lotion Apply 1 application topically See admin instructions. After every face wash   Past Month at Unknown time  . lidocaine-prilocaine (EMLA) cream Apply 1 application topically daily as needed (local anesthesia).   06/29/2020 at 0700  . losartan (COZAAR) 100 MG tablet Take 1 tablet (100 mg total) by mouth daily. 90 tablet 4 06/28/2020 at Unknown time  . mometasone (ELOCON) 0.1 % cream Apply 1 application topically daily as needed (irritation).   Past Month at Unknown time  . rosuvastatin (CRESTOR) 5 MG tablet Take 1 tablet (5 mg total) by mouth daily. (Patient taking differently: Take 5 mg by mouth at bedtime.) 90 tablet 3 06/28/2020 at Unknown time  . triamterene-hydrochlorothiazide (DYAZIDE) 37.5-25 MG capsule Take 1 each (1 capsule total) by mouth daily. 90 capsule 4 06/28/2020 at Unknown time  . venlafaxine XR (EFFEXOR XR) 75 MG 24 hr capsule Take 3 capsules (225 mg total) by mouth daily with breakfast. (Patient taking differently: Take 150 mg by mouth at bedtime.) 270  capsule 1 06/28/2020 at Unknown time   Allergies  Allergen Reactions  . Other Other (See Comments)    Some type of bandaids causes redness  . Statins     Muscle weakness  . Zetia [Ezetimibe]     Leg pain, upset stomach  . Sulfa Antibiotics Rash   Past Medical History:  Diagnosis Date  . Anemia   . Breast cancer ([place]) 04/2020   left  . Cataract   . Emphysema of lung ([place])   . Family history of breast cancer   . Family history of stomach cancer   . Hypercholesteremia   . Hypertension   . Major depressive disorder   . PVC (premature ventricular contraction)    sporatic  . 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 Vitamin D deficiency    Past Surgical History:  Procedure Laterality Date  . ABDOMINAL HYSTERECTOMY  1995  . APPENDECTOMY  1994  . BREAST BIOPSY Right 1998   neg  . BREAST BIOPSY Left 05/30/2020   stere bx x clip path pending  . ESI    . LUMBAR MICRODISCECTOMY  2019   L3-4  . LUMBAR MICRODISCECTOMY  2008   L4-L5 repeated  . MICRODISCECTOMY LUMBAR  2008   L4-5  . MICRODISCECTOMY LUMBAR  2010   L4-L5 repeated  . SQUAMOUS CELL CARCINOMA EXCISION    . TONSILLECTOMY  1971   Social History  Socioeconomic History  . Marital status: Widowed    Spouse name: Not on file  . Number of children: 1  . Years of education: Not on file  . Highest education level: Bachelor's degree (e.g., BA, AB, BS)  Occupational History  . Occupation: retired  Tobacco Use  . Smoking status: Former Smoker    Packs/day: 0.91    Years: 33.00    Pack years: 30.03    Types: Cigarettes    Quit date: 04/19/2018    Years since quitting: 2.1  . Smokeless tobacco: Never Used  Vaping Use  . Vaping Use: Never used  Substance and Sexual Activity  . Alcohol use: Yes    Comment: rarely  . Drug use: No  . Sexual activity: Not Currently  Other Topics Concern  . Not on file  Social History Narrative  . Not on file   Social Determinants  of Health   Financial Resource Strain: Not on file  Food Insecurity: Not on file  Transportation Needs: Not on file  Physical Activity: Not on file  Stress: Not on file  Social Connections: Not on file  Intimate Partner Violence: Not on file   Social History   Social History Narrative  . Not on file     ROS: Negative.     PE: HEENT: Negative. Lungs: Clear. Cardio: RR.   Assessment/Plan:  Proceed with planned breast wide excision and SLN exam.     Victoria Dean [facility] 06/29/2020

## 2020-06-29 NOTE — Anesthesia Postprocedure Evaluation (Signed)
Anesthesia Post Note  Patient: Victoria Dean  Procedure(s) Performed: BREAST LUMPECTOMY WITH SENTINEL LYMPH NODE BX (Left )  Patient location during evaluation: PACU Anesthesia Type: General Level of consciousness: awake and alert and oriented Pain management: pain level controlled Vital Signs Assessment: post-procedure vital signs reviewed and stable Respiratory status: spontaneous breathing, nonlabored ventilation and respiratory function stable Cardiovascular status: blood pressure returned to baseline and stable Postop Assessment: no signs of nausea or vomiting Anesthetic complications: no   No complications documented.   Last Vitals:  Vitals:   06/29/20 1230 06/29/20 1245  BP:  (!) 124/52  Pulse: 89 96  Resp: 15 18  Temp: 36.7 C 36.6 C  SpO2: 97% 92%    Last Pain:  Vitals:   06/29/20 1245  TempSrc: Temporal  PainSc: 4                  Slate Debroux

## 2020-07-05 ENCOUNTER — Other Ambulatory Visit: Payer: Self-pay | Admitting: Pathology

## 2020-07-05 ENCOUNTER — Telehealth: Payer: Self-pay | Admitting: *Deleted

## 2020-07-05 LAB — SURGICAL PATHOLOGY

## 2020-07-05 NOTE — Telephone Encounter (Signed)
Called daughter and let her know that dr Janese Banks off on vacation and she comes back 6/13 and there are 25 people on the list for 6/14 and we do not have any room to move the pt. Daughter understands and will keep 6/13 appt.

## 2020-07-06 ENCOUNTER — Other Ambulatory Visit: Payer: Self-pay | Admitting: *Deleted

## 2020-07-06 DIAGNOSIS — Z170 Estrogen receptor positive status [ER+]: Secondary | ICD-10-CM

## 2020-07-06 DIAGNOSIS — C50412 Malignant neoplasm of upper-outer quadrant of left female breast: Secondary | ICD-10-CM

## 2020-07-10 ENCOUNTER — Telehealth: Payer: Self-pay | Admitting: *Deleted

## 2020-07-10 DIAGNOSIS — Z20828 Contact with and (suspected) exposure to other viral communicable diseases: Secondary | ICD-10-CM | POA: Diagnosis not present

## 2020-07-10 DIAGNOSIS — J019 Acute sinusitis, unspecified: Secondary | ICD-10-CM | POA: Diagnosis not present

## 2020-07-10 NOTE — Telephone Encounter (Signed)
Yes. I would say so.   Faythe Casa, NP 07/10/2020 3:48 PM

## 2020-07-10 NOTE — Telephone Encounter (Signed)
Patient called and reports that she is scheduled for a PET Scan Thursday and she was diagnosed with COVID today.She is asking if she needs to reschedule her PET scan

## 2020-07-10 NOTE — Telephone Encounter (Signed)
Will call scheduling---how many days should we push out the scan?

## 2020-07-11 ENCOUNTER — Telehealth: Payer: Self-pay | Admitting: Oncology

## 2020-07-11 NOTE — Telephone Encounter (Signed)
Spoke with patient to notify her of rescheduled appts for PET scan and MD f/u. Patient has tested + for COVID 19. She was agreeable to all changes.

## 2020-07-12 ENCOUNTER — Encounter: Admission: RE | Admit: 2020-07-12 | Payer: Medicare PPO | Source: Ambulatory Visit

## 2020-07-16 ENCOUNTER — Inpatient Hospital Stay: Payer: Medicare PPO | Admitting: Oncology

## 2020-07-18 ENCOUNTER — Telehealth: Payer: Self-pay

## 2020-07-18 NOTE — Telephone Encounter (Signed)
Called to r/s the patient's appt and she stated she really wanted to speak to Dr. Rosanna Randy before she met with the cancer doctor on Friday, June 24.  She wanted to know if there was any way she could be worked in on June 20 or June 21.  Stated she just has some general questions she would like answered.

## 2020-07-19 NOTE — Telephone Encounter (Signed)
Please advise. Thanks.  

## 2020-07-23 ENCOUNTER — Other Ambulatory Visit: Payer: Self-pay | Admitting: Family Medicine

## 2020-07-23 MED ORDER — DIAZEPAM 5 MG PO TABS: 5.0000 mg | ORAL_TABLET | Freq: Two times a day (BID) | ORAL | 5 refills | Status: DC | PRN

## 2020-07-25 ENCOUNTER — Other Ambulatory Visit: Payer: Self-pay

## 2020-07-25 ENCOUNTER — Ambulatory Visit
Admission: RE | Admit: 2020-07-25 | Discharge: 2020-07-25 | Disposition: A | Discharge status: Home / Self Care | Payer: Medicare PPO | Source: Ambulatory Visit | Attending: Oncology | Admitting: Oncology

## 2020-07-25 DIAGNOSIS — R930 Abnormal findings on diagnostic imaging of skull and head, not elsewhere classified: Secondary | ICD-10-CM | POA: Diagnosis not present

## 2020-07-25 DIAGNOSIS — Z170 Estrogen receptor positive status [ER+]: Secondary | ICD-10-CM | POA: Insufficient documentation

## 2020-07-25 DIAGNOSIS — C50912 Malignant neoplasm of unspecified site of left female breast: Secondary | ICD-10-CM | POA: Diagnosis not present

## 2020-07-25 DIAGNOSIS — C50412 Malignant neoplasm of upper-outer quadrant of left female breast: Secondary | ICD-10-CM | POA: Insufficient documentation

## 2020-07-25 LAB — GLUCOSE, CAPILLARY: Glucose-Capillary: 98 mg/dL (ref 70–99)

## 2020-07-25 MED ORDER — FLUDEOXYGLUCOSE F - 18 (FDG) INJECTION
12.1000 | Freq: Once | INTRAVENOUS | Status: AC | PRN
  Administered 2020-07-25: 12.88 via INTRAVENOUS

## 2020-07-26 ENCOUNTER — Ambulatory Visit: Payer: Medicare PPO | Admitting: Family Medicine

## 2020-07-27 ENCOUNTER — Encounter: Payer: Self-pay | Admitting: Oncology

## 2020-07-27 ENCOUNTER — Other Ambulatory Visit: Payer: Self-pay

## 2020-07-27 ENCOUNTER — Inpatient Hospital Stay: Payer: Medicare PPO | Attending: Oncology | Admitting: Oncology

## 2020-07-27 VITALS — BP 123/60 | HR 89 | Temp 97.8°F | Resp 20 | Wt 236.2 lb

## 2020-07-27 DIAGNOSIS — Z170 Estrogen receptor positive status [ER+]: Secondary | ICD-10-CM | POA: Diagnosis not present

## 2020-07-27 DIAGNOSIS — C50412 Malignant neoplasm of upper-outer quadrant of left female breast: Secondary | ICD-10-CM | POA: Insufficient documentation

## 2020-07-27 DIAGNOSIS — Z7189 Other specified counseling: Secondary | ICD-10-CM | POA: Diagnosis not present

## 2020-07-28 NOTE — Progress Notes (Signed)
Hematology/Oncology Consult note [facility]  [phone] [phone]  Patient Care Team: Maple Hudson., MD as PCP - General (Family Medicine) Irene Limbo., MD as Consulting Physician (Ophthalmology) Dimmig, Maisie Fus, MD as Referring Physician (Orthopedic Surgery) Debbrah Alar, MD (Dermatology) Dasher, Cliffton Asters, MD (Dermatology)   Name of the patient: Victoria Dean  [id number]  1950/08/26   Date of visit: 07/28/20  Diagnosis-pathological prognostic stage Ib invasive lobular carcinoma of the left breast [id number] centimeters 0 ER/PR positive HER2 negative  Chief complaint/ Reason for visit-discuss final pathology results and further management  Heme/Onc history: Patient is a 70 year old female with a past medical history significant for claustrophobia, hypertension hypercholesterolemia, depression among other medical problems.  Patient began to notice retraction of her left nipple.  This was followed by a diagnostic mammogram which showed vague shadowing in the upper outer quadrant of the left breast without a definitive mass.  Benign cysts in the lower left breast measuring 1.5 cm.  No evidence of malignancy in the right breast.  No evidence of abnormal left axillary lymph nodes.  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  Bilateral breast MRI showed 7 x 6 x 4 cm area of non-mass enhancement in the upper outer left breast at 1 o'clock position.  This involves anterior and middle third of the breast and extension into the posterior third.  2 inferior left axillary lymph nodes with mild cortical thickening.  PET CT scan showed 1.7 cm hypermetabolic nodule in the midline of the neck which could represent thyroglossal cyst but malignancy cannot be  excluded. Patient underwent left-sided lumpectomy with nipple excision.    Final pathology showed invasive lobular carcinoma grade 210.5 cm with associated DCIS.  Skin/nipple are present and involved.  Invasive carcinoma invades the nipple without skin ulceration.  Lymphovascular invasion present.  Margins negative.  3 out of 4 lymph nodes positive for malignancy 11 mm largest deposit with extranodal extension.  pT3 pN1 a cM0    Interval history-patient is here with her daughter today and is overall doing well after surgery.  She denies any specific complaints at this time  ECOG PS- 1 Pain scale- 0   Review of systems- Review of Systems  Constitutional:  Positive for malaise/fatigue. Negative for chills, fever and weight loss.  HENT:  Negative for congestion, ear discharge and nosebleeds.   Eyes:  Negative for blurred vision.  Respiratory:  Negative for cough, hemoptysis, sputum production, shortness of breath and wheezing.   Cardiovascular:  Negative for chest pain, palpitations, orthopnea and claudication.  Gastrointestinal:  Negative for abdominal pain, blood in stool, constipation, diarrhea, heartburn, melena, nausea and vomiting.  Genitourinary:  Negative for dysuria, flank pain, frequency, hematuria and urgency.  Musculoskeletal:  Negative for back pain, joint pain and myalgias.  Skin:  Negative for rash.  Neurological:  Negative for dizziness, tingling, focal weakness, seizures, weakness and headaches.  Endo/Heme/Allergies:  Does not bruise/bleed easily.  Psychiatric/Behavioral:  Negative for depression and suicidal ideas. The patient does not have insomnia.       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    Vitamin D deficiency      Past Surgical History:  Procedure Laterality Date   ABDOMINAL HYSTERECTOMY  1995   APPENDECTOMY  1994   BREAST BIOPSY Right 1998   neg   BREAST BIOPSY Left 05/30/2020   stere bx x clip path pending   BREAST LUMPECTOMY WITH SENTINEL LYMPH NODE BIOPSY Left 06/29/2020   Procedure: BREAST LUMPECTOMY WITH SENTINEL LYMPH NODE BX;  Surgeon: Robert Bellow, MD;  Location: [facility] ORS;  Service: General;  Laterality: Left;   ESI     LUMBAR MICRODISCECTOMY  2019   L3-4   LUMBAR MICRODISCECTOMY  2008   L4-L5 repeated   MICRODISCECTOMY LUMBAR  2008   L4-5   MICRODISCECTOMY LUMBAR  2010   L4-L5 repeated   SQUAMOUS CELL CARCINOMA EXCISION     TONSILLECTOMY  1971    Social History   Socioeconomic History   Marital status: Widowed    Spouse name: Not on file   Number of children: 1   Years of education: Not on file   Highest education level: Bachelor's degree (e.g., BA, AB, BS)  Occupational History   Occupation: retired  Tobacco Use   Smoking status: Former    Packs/day: 0.91    Years: 33.00    Pack years: 30.03    Types: Cigarettes    Quit date: 04/19/2018    Years since quitting: 2.2   Smokeless tobacco: Never  Vaping Use   Vaping Use: Never used  Substance and Sexual Activity   Alcohol use: Yes    Comment: rarely   Drug use: No   Sexual activity: Not Currently  Other Topics Concern   Not on file  Social History Narrative   Not on file   Social Determinants of Health   Financial Resource Strain: Not on file  Food Insecurity: Not on file  Transportation Needs: Not on file  Physical Activity: Not on file  Stress: Not on file  Social  Connections: Not on file  Intimate Partner Violence: Not on file    Family History  Problem Relation Age of Onset   Breast cancer Mother 48   Osteosarcoma Mother        d. 68   Hypertension Brother    COPD Father    Hypertension Father    Breast cancer Paternal Aunt        dx 59s   Breast cancer Paternal Grandmother        dx 11s   Stomach cancer Paternal Grandfather        dx 56s   Breast cancer Paternal Aunt        dx 38s   Brain cancer Paternal Aunt        dx 59s     Current Outpatient Medications:    Biotin 1000 MCG tablet, Take 1,000 mcg by mouth daily., Disp: , Rfl:    Cholecalciferol (VITAMIN D3) 25 MCG (1000  UT) CAPS, Take 1,000 Units by mouth daily., Disp: , Rfl:    diazepam (VALIUM) 5 MG tablet, Take 1 tablet (5 mg total) by mouth every 12 (twelve) hours as needed for anxiety., Disp: 60 tablet, Rfl: 5   losartan (COZAAR) 100 MG tablet, Take 1 tablet (100 mg total) by mouth daily., Disp: 90 tablet, Rfl: 4   mometasone (ELOCON) 0.1 % cream, Apply 1 application topically daily as needed (irritation)., Disp: , Rfl:    rosuvastatin (CRESTOR) 5 MG tablet, Take 1 tablet (5 mg total) by mouth daily. (Patient taking differently: Take 5 mg by mouth at bedtime.), Disp: 90 tablet, Rfl: 3   triamterene-hydrochlorothiazide (DYAZIDE) 37.5-25 MG capsule, Take 1 each (1 capsule total) by mouth daily., Disp: 90 capsule, Rfl: 4   venlafaxine XR (EFFEXOR XR) 75 MG 24 hr capsule, Take 3 capsules (225 mg total) by mouth daily with breakfast. (Patient taking differently: Take 150 mg by mouth at bedtime.), Disp: 270 capsule, Rfl: 1   acetaminophen (TYLENOL) 500 MG tablet, Take 500-1,000 mg by mouth every 8 (eight) hours as needed for moderate pain. (Patient not taking: Reported on 07/27/2020), Disp: , Rfl:    HYDROcodone-acetaminophen (NORCO/VICODIN) 5-325 MG tablet, Take 1 tablet by mouth every 4 (four) hours as needed for moderate pain. (Patient not taking: Reported on 07/27/2020), Disp: 15  tablet, Rfl: 0   hydrocortisone 2.5 % lotion, Apply 1 application topically See admin instructions. After every face wash (Patient not taking: Reported on 07/27/2020), Disp: , Rfl:    lidocaine-prilocaine (EMLA) cream, Apply 1 application topically daily as needed (local anesthesia). (Patient not taking: Reported on 07/27/2020), Disp: , Rfl:   Physical exam:  Vitals:   07/27/20 1100  BP: 123/60  Pulse: 89  Resp: 20  Temp: 97.8 F (36.6 C)  SpO2: 98%  Weight: 236 lb 3.2 oz (107.1 kg)   Physical Exam Constitutional:      Comments: Ambulates with a cane.  Appears in no acute distress  Cardiovascular:     Rate and Rhythm: Normal rate and regular rhythm.     Heart sounds: Normal heart sounds.  Pulmonary:     Effort: Pulmonary effort is normal.     Breath sounds: Normal breath sounds.  Abdominal:     General: Bowel sounds are normal.     Palpations: Abdomen is soft.  Skin:    General: Skin is warm and dry.  Neurological:     Mental Status: She is alert and oriented to person, place, and time.  Breast exam: Patient is s/p left-sided lumpectomy with nipple excision.  Surgical scar is healing well.  CMP Latest Ref Rng & Units 06/20/2020  Glucose 70 - 99 mg/dL -  BUN 8 - 23 mg/dL -  Creatinine 0.44 - 1.00 mg/dL -  Sodium 135 - 145 mmol/L -  Potassium 3.5 - 5.2 mmol/L 4.2  Chloride 98 - 111 mmol/L -  CO2 22 - 32 mmol/L -  Calcium 8.9 - 10.3 mg/dL -  Total Protein 6.5 - 8.1 g/dL -  Total Bilirubin 0.3 - 1.2 mg/dL -  Alkaline Phos 38 - 126 U/L -  AST 15 - 41 U/L -  ALT 0 - 44 U/L -   CBC Latest Ref Rng & Units 02/23/2020  WBC 3.4 - 10.8 x10E3/uL 8.9  Hemoglobin 11.1 - 15.9 g/dL 11.9  Hematocrit 34.0 - 46.6 % 37.5  Platelets 150 - 450 x10E3/uL 274    No images are attached to the encounter.  NM PET  Image Initial (PI) Skull Base To Thigh  Result Date: 07/26/2020 CLINICAL DATA:  Initial treatment strategy for left breast carcinoma. Approximately 1 month status post breast  lumpectomy and sentinel lymph node biopsy. EXAM: NUCLEAR MEDICINE PET SKULL BASE TO THIGH TECHNIQUE: 12.9 mCi F-18 FDG was injected intravenously. Full-ring PET imaging was performed from the skull base to thigh after the radiotracer. CT data was obtained and used for attenuation correction and anatomic localization. Fasting blood glucose: 98 mg/dl COMPARISON:  Lung cancer screening chest CT on 03/27/2020 FINDINGS: Mediastinal blood-pool activity (background): SUV max = 3.3 Liver activity (reference): SUV max = N/A NECK: No hypermetabolic lymph nodes. A 1.7 cm nodule is seen in the midline between the hyoid bone and thyroid cartilage which is hypermetabolic, with SUV max of 15.3. No other hypermetabolic lesion seen. Incidental CT findings:  None. CHEST: Postop changes are seen in the left breast from recent lumpectomy. Multiple small left axillary lymph nodes are seen measuring up to 10 mm which show low-grade FDG uptake, with SUV max of 2.8. No other hypermetabolic masses or lymphadenopathy identified within the thorax. No suspicious pulmonary nodules seen on CT images. Incidental CT findings:  None. ABDOMEN/PELVIS: No abnormal hypermetabolic activity within the liver, pancreas, adrenal glands, or spleen. No hypermetabolic lymph nodes in the abdomen or pelvis. Incidental CT findings:  None. SKELETON: No focal hypermetabolic bone lesions to suggest skeletal metastasis. Incidental CT findings:  None. IMPRESSION: Postop changes from recent left breast lumpectomy and sentinel lymph node biopsy. Multiple small left axillary lymph nodes measuring up to 10 mm show low-grade FDG uptake,. While these may be postoperative/reactive etiology, metastatic disease cannot be excluded. No evidence of distant metastatic disease. 1.7 cm hypermetabolic nodule in the midline of the neck, between the hyoid bone and thyroid cartilage. 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 Recommend neck MRI without and with contrast for further evaluation. Electronically Signed   By: Marlaine Hind M.D.   On: 07/26/2020 10:03   NM Sentinel Node Inj-No Rpt (Breast)  Result Date: 06/29/2020 Sulfur Colloid was injected by the Nuclear Medicine Technologist for sentinel lymph node localization.   MM Breast Surgical Specimen  Result Date: 07/03/2020 CLINICAL DATA:  Specimen radiograph status post left breast lumpectomy. EXAM: SPECIMEN RADIOGRAPH OF THE LEFT BREAST COMPARISON:  Previous exam(s). FINDINGS: Status post excision of the left breast. The biopsy marker clip is present within the specimen. IMPRESSION: Specimen radiograph of the left breast. Electronically Signed   By: Ammie Ferrier M.D.   On: 07/03/2020 13:21    Assessment and plan- Patient is a 70 y.o. 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  Lymphovascular invasion was present.  3 out of 4 lymph nodes were positive for malignancy with metastatic deposit of 11 mm and extranodal extension present.  Also discussed the results of the PET CT scanWhich did not reveal any evidence of distant metastatic disease.  Postoperative changes seen in the left breast as well as multiple small axillary lymph nodes up to 10 mm with a low FDG uptake of 2.8.  Also noted to have a 1.7 cm nodule between the hyoid  bone and thyroid cartilage with an SUV of 15.3.  Discussed with the patient that this nodule represents thyroglossal cyst although malignancy was not excluded.  We discussed further management of this nodule including proceeding with biopsy at this time versus  wait and watch with a repeat CT soft tissue neck in 3 to 4 months time.  Patient prefers the second option.  Based on the PET scan it is unclear if the low-level hypermetabolic exam in the left axillary lymph nodes indicates postsurgical changes versus residual disease.  Regardless patient had 3 positive sentinel lymph nodes with extranodal extension.  She would therefore benefit from axillary lymph node dissection regardless of adjuvant radiation treatment.  I have discussed her case with Dr. Bary Castilla who will follow up with the patient and discuss it further.  I will be making a referral to radiation oncology at this time as well.  I also discussed the data derived from PREDICT breast cancer risk score which predicts overall survival 10 years after surgery.  Her absolute benefit from hormone therapy would be about 11% for 10 years.  Additional 4.4% survival benefit also obtained from bisphosphonates which I will be talking to her down the line.  Chemotherapy would give her a benefit of 11.5% which is significant enough to warrant adjuvant chemotherapy regardless of Oncotype testing.  Also Oncotype testing cannot be recommended in tumors as large as 10.5 cm with 3 positive lymph nodes and extranodal extension.  Lobular carcinoma was may be relatively chemo resistant.  Discussed the need for anthracycline-based chemotherapy including dose dense AC given every 2 weeks for 4 cycles followed by 12 weekly cycles of Taxol.  Not anthracycline options including 4 cycles of adjuvant TC chemotherapy is also a consideration.  Discussed risks and benefits of chemotherapy including all but not limited to nausea, vomiting, low blood counts, risk of infections and hospitalizations.  Risk of cardiotoxicity and possible leukemia associated with anthracyclines.  Patient understands all her options well.  She is concerned about side effects of chemotherapy and her impact on quality of life.  She understands that 11% could mean  significant benefit but she does not wish to proceed with chemotherapy at this time.  I have communicated these findings to Dr. Bary Castilla as well.  She would be a candidate for adjuvant abemaciclib down the line after she finishes adjuvant radiation treatment given that her tumor was more than 4 cm regardless of Ki-67.  This would be combined with endocrine therapy which she would benefit with as well given that her tumor was ER positive.  I will plan to discuss hormone therapy and adjuvant abemaciclib options after she starts radiation treatment.  Treatment will be given with a curative intent   Total face to face encounter time for this patient visit was 50 min.  Total non face to face encounter time for this patient on the day of the visit was 15 min as well as discussing patients case with Dr. Bary Castilla   Cancer Staging Malignant neoplasm of upper-outer quadrant of left breast in female, estrogen receptor positive ([place]) Staging form: Breast, AJCC 8th Edition - Pathologic stage from 07/27/2020: Stage IB (pT3, pN1a(sn), cM0, G2, ER+, PR+, HER2-) - Signed by Sindy Guadeloupe, MD on 07/28/2020 Stage prefix: Initial diagnosis Method of lymph node assessment: Sentinel lymph node biopsy Multigene prognostic tests performed: None Histologic grading system: 3 grade system Menopausal status: Postmenopausal    Visit Diagnosis 1. Malignant neoplasm of upper-outer quadrant of left breast in female, estrogen  receptor positive ([place])   2. Goals of care, counseling/discussion      Dr. Randa Evens, MD, MPH [facility] at [facility] [phone] 07/28/2020 4:59 PM

## 2020-07-30 ENCOUNTER — Ambulatory Visit: Payer: Medicare PPO | Admitting: Family Medicine

## 2020-08-02 ENCOUNTER — Other Ambulatory Visit: Payer: Self-pay | Admitting: General Surgery

## 2020-08-03 ENCOUNTER — Ambulatory Visit: Payer: Medicare PPO | Admitting: Radiation Oncology

## 2020-08-08 ENCOUNTER — Other Ambulatory Visit: Payer: Self-pay

## 2020-08-08 ENCOUNTER — Other Ambulatory Visit
Admission: RE | Admit: 2020-08-08 | Discharge: 2020-08-08 | Disposition: A | Discharge status: Home / Self Care | Payer: Medicare PPO | Source: Ambulatory Visit | Attending: General Surgery | Admitting: General Surgery

## 2020-08-08 HISTORY — DX: Dyspnea, unspecified: R06.00

## 2020-08-08 HISTORY — DX: Cardiac arrhythmia, unspecified: I49.9

## 2020-08-08 NOTE — Patient Instructions (Signed)
Your procedure is scheduled on: Monday August 13, 2020. Report to Day Surgery inside [facility] 2nd floor (stop by admissions first before getting on elevator). To find out your arrival time please call [phone] between 1PM - 3PM on Friday August 10, 2020.  Remember: Instructions that are not followed completely may result in serious medical risk,  up to and including death, or upon the discretion of your surgeon and anesthesiologist your  surgery may need to be rescheduled.     _X__ 1. Do not eat food after midnight the night before your procedure.                 No chewing gum or hard candies. You may drink clear liquids up to 2 hours                 before you are scheduled to arrive for your surgery- DO not drink clear                 liquids within 2 hours of the start of your surgery.                 Clear Liquids include:  water, apple juice without pulp, clear Gatorade, G2 or                  Gatorade Zero (avoid Red/Purple/Blue), Black Coffee or Tea (Do not add                 anything to coffee or tea).  __X__2.  On the morning of surgery brush your teeth with toothpaste and water, you                may rinse your mouth with mouthwash if you wish.  Do not swallow any toothpaste of mouthwash.     _X__ 3.  No Alcohol for 24 hours before or after surgery.   _X__ 4.  Do Not Smoke or use e-cigarettes For 24 Hours Prior to Your Surgery.                 Do not use any chewable tobacco products for at least 6 hours prior to                 Surgery.  _X__  5.  Do not use any recreational drugs (marijuana, cocaine, heroin, ecstasy, MDMA or other)                For at least one week prior to your surgery.  Combination of these drugs with anesthesia                May have life threatening results.  __X__ 6.  Notify your doctor if there is any change in your medical condition      (cold, fever, infections).     Do not wear jewelry, make-up, hairpins, clips  or nail polish. Do not wear lotions, powders, or perfumes. You may wear deodorant. Do not shave 48 hours prior to surgery. Men may shave face and neck. Do not bring valuables to the hospital.    [facility] is not responsible for any belongings or valuables.  Contacts, dentures or bridgework may not be worn into surgery. Leave your suitcase in the car. After surgery it may be brought to your room. For patients admitted to the hospital, discharge time is determined by your treatment team.   Patients discharged the day of surgery will not be allowed to drive home.  Make arrangements for someone to be with you for the first 24 hours of your Same Day Discharge.  __X__ Take these medicines the morning of surgery with A SIP OF WATER:    1. None   2.   3.   4.  5.  6.  ____ Fleet Enema (as directed)   __X__ Use CHG Soap (or wipes) as directed  ____ Use Benzoyl Peroxide Gel as instructed  ____ Use inhalers on the day of surgery  ____ Stop metformin 2 days prior to surgery    ____ Take 1/2 of usual insulin dose the night before surgery. No insulin the morning          of surgery.   ____ Call your PCP, cardiologist, or Pulmonologist if taking Coumadin/Plavix/aspirin and ask when to stop before your surgery.   __X__ One Week prior to surgery- Stop Anti-inflammatories such as Ibuprofen, Aleve, Advil, Motrin, meloxicam (MOBIC), diclofenac, etodolac, ketorolac, Toradol, Daypro, piroxicam, Goody's or BC powders. OK TO USE TYLENOL IF NEEDED   __X__ Stop supplements until after surgery. Biotin 1000 MCG    ____ Bring C-Pap to the hospital.    If you have any questions regarding your pre-procedure instructions,  Please call Pre-admit Testing at [phone]

## 2020-08-13 ENCOUNTER — Other Ambulatory Visit: Payer: Self-pay

## 2020-08-13 ENCOUNTER — Ambulatory Visit: Payer: Medicare PPO | Admitting: Registered Nurse

## 2020-08-13 ENCOUNTER — Ambulatory Visit
Admission: RE | Admit: 2020-08-13 | Discharge: 2020-08-13 | Disposition: A | Discharge status: Home / Self Care | Payer: Medicare PPO | Attending: General Surgery | Admitting: General Surgery

## 2020-08-13 ENCOUNTER — Encounter
Admission: RE | Disposition: A | Discharge status: Home / Self Care | Payer: Self-pay | Source: Home / Self Care | Attending: General Surgery

## 2020-08-13 ENCOUNTER — Encounter: Payer: Self-pay | Admitting: General Surgery

## 2020-08-13 DIAGNOSIS — C50912 Malignant neoplasm of unspecified site of left female breast: Secondary | ICD-10-CM | POA: Diagnosis not present

## 2020-08-13 DIAGNOSIS — Z79899 Other long term (current) drug therapy: Secondary | ICD-10-CM | POA: Insufficient documentation

## 2020-08-13 DIAGNOSIS — Z882 Allergy status to sulfonamides status: Secondary | ICD-10-CM | POA: Insufficient documentation

## 2020-08-13 DIAGNOSIS — C773 Secondary and unspecified malignant neoplasm of axilla and upper limb lymph nodes: Secondary | ICD-10-CM | POA: Diagnosis not present

## 2020-08-13 DIAGNOSIS — C50812 Malignant neoplasm of overlapping sites of left female breast: Secondary | ICD-10-CM | POA: Diagnosis not present

## 2020-08-13 DIAGNOSIS — Z87891 Personal history of nicotine dependence: Secondary | ICD-10-CM | POA: Diagnosis not present

## 2020-08-13 DIAGNOSIS — Z803 Family history of malignant neoplasm of breast: Secondary | ICD-10-CM | POA: Insufficient documentation

## 2020-08-13 DIAGNOSIS — F418 Other specified anxiety disorders: Secondary | ICD-10-CM | POA: Diagnosis not present

## 2020-08-13 DIAGNOSIS — C50412 Malignant neoplasm of upper-outer quadrant of left female breast: Secondary | ICD-10-CM | POA: Diagnosis not present

## 2020-08-13 DIAGNOSIS — Z888 Allergy status to other drugs, medicaments and biological substances status: Secondary | ICD-10-CM | POA: Diagnosis not present

## 2020-08-13 DIAGNOSIS — Z9889 Other specified postprocedural states: Secondary | ICD-10-CM

## 2020-08-13 HISTORY — PX: NODE DISSECTION: SHX5269

## 2020-08-13 SURGERY — NODE DISSECTION
Anesthesia: General | Laterality: Left

## 2020-08-13 MED ORDER — FAMOTIDINE 20 MG PO TABS: 20.0000 mg | ORAL_TABLET | Freq: Once | ORAL | Status: AC

## 2020-08-13 MED ORDER — ROCURONIUM BROMIDE 100 MG/10ML IV SOLN
INTRAVENOUS | Status: DC | PRN
  Administered 2020-08-13: 50 mg via INTRAVENOUS

## 2020-08-13 MED ORDER — LACTATED RINGERS IV SOLN: INTRAVENOUS | Status: DC

## 2020-08-13 MED ORDER — CEFAZOLIN SODIUM-DEXTROSE 2-4 GM/100ML-% IV SOLN
2.0000 g | INTRAVENOUS | Status: AC
  Administered 2020-08-13: 2 g via INTRAVENOUS

## 2020-08-13 MED ORDER — METHYLENE BLUE 0.5 % INJ SOLN
INTRAVENOUS | Status: AC
  Filled 2020-08-13: qty 10

## 2020-08-13 MED ORDER — ORAL CARE MOUTH RINSE: 15.0000 mL | Freq: Once | OROMUCOSAL | Status: AC

## 2020-08-13 MED ORDER — BUPIVACAINE-EPINEPHRINE (PF) 0.5% -1:200000 IJ SOLN
INTRAMUSCULAR | Status: AC
  Filled 2020-08-13: qty 30

## 2020-08-13 MED ORDER — DEXAMETHASONE SODIUM PHOSPHATE 10 MG/ML IJ SOLN
INTRAMUSCULAR | Status: DC | PRN
  Administered 2020-08-13: 5 mg via INTRAVENOUS

## 2020-08-13 MED ORDER — PROPOFOL 10 MG/ML IV BOLUS
INTRAVENOUS | Status: AC
  Filled 2020-08-13: qty 20

## 2020-08-13 MED ORDER — CHLORHEXIDINE GLUCONATE CLOTH 2 % EX PADS: 6.0000 | MEDICATED_PAD | Freq: Once | CUTANEOUS | Status: DC

## 2020-08-13 MED ORDER — SUGAMMADEX SODIUM 200 MG/2ML IV SOLN
INTRAVENOUS | Status: DC | PRN
  Administered 2020-08-13: 150 mg via INTRAVENOUS
  Administered 2020-08-13 (×2): 100 mg via INTRAVENOUS

## 2020-08-13 MED ORDER — FENTANYL CITRATE (PF) 100 MCG/2ML IJ SOLN
INTRAMUSCULAR | Status: AC
  Administered 2020-08-13: 25 ug via INTRAVENOUS
  Filled 2020-08-13: qty 2

## 2020-08-13 MED ORDER — CHLORHEXIDINE GLUCONATE CLOTH 2 % EX PADS
6.0000 | MEDICATED_PAD | Freq: Once | CUTANEOUS | Status: AC
  Administered 2020-08-13: 6 via TOPICAL

## 2020-08-13 MED ORDER — MEPERIDINE HCL 25 MG/ML IJ SOLN: 6.2500 mg | INTRAMUSCULAR | Status: DC | PRN

## 2020-08-13 MED ORDER — ACETAMINOPHEN 10 MG/ML IV SOLN
INTRAVENOUS | Status: DC | PRN
  Administered 2020-08-13: 1000 mg via INTRAVENOUS

## 2020-08-13 MED ORDER — LIDOCAINE HCL (CARDIAC) PF 100 MG/5ML IV SOSY
PREFILLED_SYRINGE | INTRAVENOUS | Status: DC | PRN
  Administered 2020-08-13: 80 mg via INTRAVENOUS

## 2020-08-13 MED ORDER — BUPIVACAINE-EPINEPHRINE 0.5% -1:200000 IJ SOLN
INTRAMUSCULAR | Status: DC | PRN
  Administered 2020-08-13: 30 mL

## 2020-08-13 MED ORDER — ACETAMINOPHEN 10 MG/ML IV SOLN
INTRAVENOUS | Status: AC
  Filled 2020-08-13: qty 100

## 2020-08-13 MED ORDER — FENTANYL CITRATE (PF) 100 MCG/2ML IJ SOLN
INTRAMUSCULAR | Status: AC
  Administered 2020-08-13: 50 ug via INTRAVENOUS
  Filled 2020-08-13: qty 2

## 2020-08-13 MED ORDER — FENTANYL CITRATE (PF) 100 MCG/2ML IJ SOLN
INTRAMUSCULAR | Status: DC | PRN
  Administered 2020-08-13 (×2): 50 ug via INTRAVENOUS

## 2020-08-13 MED ORDER — FAMOTIDINE 20 MG PO TABS
ORAL_TABLET | ORAL | Status: AC
  Administered 2020-08-13: 20 mg via ORAL
  Filled 2020-08-13: qty 1

## 2020-08-13 MED ORDER — FENTANYL CITRATE (PF) 100 MCG/2ML IJ SOLN
INTRAMUSCULAR | Status: AC
  Filled 2020-08-13: qty 2

## 2020-08-13 MED ORDER — CHLORHEXIDINE GLUCONATE 0.12 % MT SOLN: 15.0000 mL | Freq: Once | OROMUCOSAL | Status: AC

## 2020-08-13 MED ORDER — CHLORHEXIDINE GLUCONATE 0.12 % MT SOLN
OROMUCOSAL | Status: AC
  Administered 2020-08-13: 15 mL via OROMUCOSAL
  Filled 2020-08-13: qty 15

## 2020-08-13 MED ORDER — FENTANYL CITRATE (PF) 100 MCG/2ML IJ SOLN
25.0000 ug | INTRAMUSCULAR | Status: DC | PRN
  Administered 2020-08-13: 50 ug via INTRAVENOUS
  Administered 2020-08-13: 25 ug via INTRAVENOUS

## 2020-08-13 MED ORDER — STERILE WATER FOR IRRIGATION IR SOLN
Status: DC | PRN
  Administered 2020-08-13: 100 mL

## 2020-08-13 MED ORDER — CEFAZOLIN SODIUM-DEXTROSE 2-4 GM/100ML-% IV SOLN
INTRAVENOUS | Status: AC
  Filled 2020-08-13: qty 100

## 2020-08-13 MED ORDER — GLYCOPYRROLATE 0.2 MG/ML IJ SOLN
INTRAMUSCULAR | Status: AC
  Filled 2020-08-13: qty 1

## 2020-08-13 MED ORDER — EPHEDRINE SULFATE 50 MG/ML IJ SOLN
INTRAMUSCULAR | Status: DC | PRN
  Administered 2020-08-13 (×4): 5 mg via INTRAVENOUS

## 2020-08-13 MED ORDER — METHYLENE BLUE 0.5 % INJ SOLN
INTRAVENOUS | Status: DC | PRN
  Administered 2020-08-13: 2 mL via SUBMUCOSAL

## 2020-08-13 MED ORDER — ONDANSETRON HCL 4 MG/2ML IJ SOLN
INTRAMUSCULAR | Status: DC | PRN
  Administered 2020-08-13: 4 mg via INTRAVENOUS

## 2020-08-13 MED ORDER — ONDANSETRON HCL 4 MG/2ML IJ SOLN: 4.0000 mg | Freq: Once | INTRAMUSCULAR | Status: DC | PRN

## 2020-08-13 SURGICAL SUPPLY — 48 items
APL PRP STRL LF DISP 70% ISPRP (MISCELLANEOUS) ×1
APPLIER CLIP 11 MED OPEN (CLIP)
APR CLP MED 11 20 MLT OPN (CLIP)
BLADE BOVIE TIP EXT 4 (BLADE) ×2 IMPLANT
BLADE SURG 15 STRL SS SAFETY (BLADE) ×2 IMPLANT
BNDG CMPR STD VLCR NS LF 5.8X6 (GAUZE/BANDAGES/DRESSINGS) ×1
BNDG ELASTIC 6X5.8 VLCR NS LF (GAUZE/BANDAGES/DRESSINGS) ×2 IMPLANT
BNDG GAUZE ELAST 4 BULKY (GAUZE/BANDAGES/DRESSINGS) ×2 IMPLANT
BULB RESERV EVAC DRAIN JP 100C (MISCELLANEOUS) ×2 IMPLANT
CANISTER SUCT 1200ML W/VALVE (MISCELLANEOUS) IMPLANT
CHLORAPREP W/TINT 26 (MISCELLANEOUS) ×2 IMPLANT
CLIP APPLIE 11 MED OPEN (CLIP) IMPLANT
CLIP VESOCCLUDE MED 6/CT (CLIP) ×2 IMPLANT
DRAIN CHANNEL JP 15F RND 16 (MISCELLANEOUS) ×2 IMPLANT
DRAPE LAPAROTOMY TRNSV 106X77 (MISCELLANEOUS) ×2 IMPLANT
DRSG GAUZE FLUFF 36X18 (GAUZE/BANDAGES/DRESSINGS) ×2 IMPLANT
DRSG TEGADERM 4X4.75 (GAUZE/BANDAGES/DRESSINGS) ×2 IMPLANT
DRSG TELFA 4X3 1S NADH ST (GAUZE/BANDAGES/DRESSINGS) ×2 IMPLANT
ELECT REM PT RETURN 9FT ADLT (ELECTROSURGICAL) ×2
ELECTRODE REM PT RTRN 9FT ADLT (ELECTROSURGICAL) ×1 IMPLANT
GAUZE 4X4 16PLY ~~LOC~~+RFID DBL (SPONGE) ×2 IMPLANT
GLOVE SURG ENC MOIS LTX SZ7.5 (GLOVE) ×2 IMPLANT
GLOVE SURG UNDER LTX SZ8 (GLOVE) ×2 IMPLANT
GOWN STRL REUS W/ TWL LRG LVL3 (GOWN DISPOSABLE) ×2 IMPLANT
GOWN STRL REUS W/TWL LRG LVL3 (GOWN DISPOSABLE) ×4
KIT TURNOVER KIT A (KITS) ×2 IMPLANT
LABEL OR SOLS (LABEL) ×2 IMPLANT
MANIFOLD NEPTUNE II (INSTRUMENTS) IMPLANT
MARGIN MAP 10MM (MISCELLANEOUS) IMPLANT
NEEDLE HYPO 25X1 1.5 SAFETY (NEEDLE) ×2 IMPLANT
NS IRRIG 500ML POUR BTL (IV SOLUTION) IMPLANT
PACK BASIN MINOR ARMC (MISCELLANEOUS) ×2 IMPLANT
SHEARS FOC LG CVD HARMONIC 17C (MISCELLANEOUS) ×2 IMPLANT
STRIP CLOSURE SKIN 1/2X4 (GAUZE/BANDAGES/DRESSINGS) ×2 IMPLANT
SUT ETHILON 3-0 FS-10 30 BLK (SUTURE) ×2
SUT SILK 2 0 (SUTURE) ×2
SUT SILK 2-0 30XBRD TIE 12 (SUTURE) ×1 IMPLANT
SUT VIC AB 0 CT1 36 (SUTURE) ×2 IMPLANT
SUT VIC AB 2-0 CT1 (SUTURE) ×2 IMPLANT
SUT VIC AB 2-0 CT1 27 (SUTURE) ×2
SUT VIC AB 2-0 CT1 TAPERPNT 27 (SUTURE) ×1 IMPLANT
SUT VIC AB 3-0 54X BRD REEL (SUTURE) ×1 IMPLANT
SUT VIC AB 3-0 BRD 54 (SUTURE) ×2
SUT VIC AB 4-0 PS2 18 (SUTURE) ×2 IMPLANT
SUTURE EHLN 3-0 FS-10 30 BLK (SUTURE) ×1 IMPLANT
SWABSTK COMLB BENZOIN TINCTURE (MISCELLANEOUS) ×2 IMPLANT
SYR 10ML LL (SYRINGE) ×2 IMPLANT
WATER STERILE IRR 1000ML POUR (IV SOLUTION) ×2 IMPLANT

## 2020-08-13 NOTE — Anesthesia Procedure Notes (Signed)
Procedure Name: Intubation Date/Time: 08/13/2020 2:01 PM Performed by: Zetta Bills, CRNA Pre-anesthesia Checklist: Patient identified, Emergency Drugs available, Suction available, Patient being monitored and Timeout performed Patient Re-evaluated:Patient Re-evaluated prior to induction Oxygen Delivery Method: Circle system utilized Preoxygenation: Pre-oxygenation with 100% oxygen Induction Type: IV induction Ventilation: Two handed mask ventilation required Laryngoscope Size: McGraph and 3 Grade View: Grade I Tube type: Oral Tube size: 7.0 mm Number of attempts: 1 Airway Equipment and Method: Patient positioned with wedge pillow and Stylet Placement Confirmation: ETT inserted through vocal cords under direct vision Secured at: 21 cm Tube secured with: Tape Dental Injury: Teeth and Oropharynx as per pre-operative assessment

## 2020-08-13 NOTE — H&P (Signed)
Victoria Dean [id number] 1950-05-11     HPI: 70 y/o with multiple SLN positive at time of wide excision.  She is a candidate for axillary dissection.    Medications Prior to Admission  Medication Sig Dispense Refill Last Dose   acetaminophen (TYLENOL) 650 MG CR tablet Take 1,300 mg by mouth every 8 (eight) hours as needed for pain.   08/12/2020   Biotin 1000 MCG tablet Take 1,000 mcg by mouth daily.   08/09/2020   Cholecalciferol (VITAMIN D3) 75 MCG (3000 UT) TABS Take 3,000 Units by mouth daily.   08/12/2020   diazepam (VALIUM) 5 MG tablet Take 1 tablet (5 mg total) by mouth every 12 (twelve) hours as needed for anxiety. (Patient taking differently: Take 5 mg by mouth 2 (two) times daily.) 60 tablet 5 08/12/2020   hydrocortisone 2.5 % lotion Apply 1 application topically daily as needed (when washing face).   08/12/2020   losartan (COZAAR) 100 MG tablet Take 1 tablet (100 mg total) by mouth daily. 90 tablet 4 08/12/2020   mometasone (ELOCON) 0.1 % cream Apply 1 application topically daily as needed (ear irritation).   Past Week   Polyethyl Glycol-Propyl Glycol (SYSTANE OP) Place 1 drop into both eyes daily as needed (dry eyes).   Past Week   rosuvastatin (CRESTOR) 5 MG tablet Take 1 tablet (5 mg total) by mouth daily. (Patient taking differently: Take 5 mg by mouth at bedtime.) 90 tablet 3 08/12/2020   triamterene-hydrochlorothiazide (DYAZIDE) 37.5-25 MG capsule Take 1 each (1 capsule total) by mouth daily. 90 capsule 4 08/12/2020   venlafaxine XR (EFFEXOR XR) 75 MG 24 hr capsule Take 3 capsules (225 mg total) by mouth daily with breakfast. (Patient taking differently: Take 150 mg by mouth at bedtime.) 270 capsule 1 08/12/2020   HYDROcodone-acetaminophen (NORCO/VICODIN) 5-325 MG tablet Take 1 tablet by mouth every 4 (four) hours as needed for moderate pain. (Patient not taking: No sig reported) 15 tablet 0 Completed Course   Allergies  Allergen Reactions   Other Other (See Comments)    Brand name  bandaids with cloth backing causes redness and itching   Statins     Muscle weakness   Zetia [Ezetimibe]     Leg pain, upset stomach   Sulfa Antibiotics Rash   Past Medical History:  Diagnosis Date   Anemia    Breast cancer ([place]) 04/2020   left   Cataract    Dyspnea    Dysrhythmia    Emphysema of lung ([place])    Family history of breast cancer    Family history of stomach cancer    Hypercholesteremia    Hypertension    Major depressive disorder    PVC (premature ventricular contraction)    sporatic   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    Vitamin D deficiency    Past Surgical History:  Procedure Laterality Date   ABDOMINAL HYSTERECTOMY  1995   APPENDECTOMY  1994   BREAST BIOPSY Right 1998   neg   BREAST BIOPSY Left 05/30/2020   stere bx x clip path pending   BREAST LUMPECTOMY WITH SENTINEL LYMPH NODE BIOPSY Left 06/29/2020   Procedure: BREAST LUMPECTOMY WITH SENTINEL LYMPH NODE BX;  Surgeon: Robert Bellow, MD;  Location: [facility] ORS;  Service: General;  Laterality: Left;   ESI     LUMBAR MICRODISCECTOMY  2019   L3-4   LUMBAR MICRODISCECTOMY  2008  L4-L5 repeated   MICRODISCECTOMY LUMBAR  2008   L4-5   MICRODISCECTOMY LUMBAR  2010   L4-L5 repeated   SQUAMOUS CELL CARCINOMA EXCISION     TONSILLECTOMY  1971   Social History   Socioeconomic History   Marital status: Widowed    Spouse name: Not on file   Number of children: 1   Years of education: Not on file   Highest education level: Bachelor's degree (e.g., BA, AB, BS)  Occupational History   Occupation: retired  Tobacco Use   Smoking status: Former    Packs/day: 0.91    Years: 33.00    Pack years: 30.03    Types: Cigarettes    Quit date: 04/19/2018    Years since quitting: 2.3   Smokeless tobacco: Never  Vaping Use   Vaping Use: Never used  Substance and Sexual Activity   Alcohol use: Yes    Comment: rarely   Drug use: No   Sexual activity: Not  Currently  Other Topics Concern   Not on file  Social History Narrative   Not on file   Social Determinants of Health   Financial Resource Strain: Not on file  Food Insecurity: Not on file  Transportation Needs: Not on file  Physical Activity: Not on file  Stress: Not on file  Social Connections: Not on file  Intimate Partner Violence: Not on file   Social History   Social History Narrative   Not on file     ROS: Negative.     PE: HEENT: Negative. Lungs: Clear. Cardio: RR.  Assessment/Plan:  Proceed with axillary node dissection.   [name] 08/13/2020

## 2020-08-13 NOTE — Discharge Instructions (Signed)
AMBULATORY SURGERY  ?DISCHARGE INSTRUCTIONS ? ? ?The drugs that you were given will stay in your system until tomorrow so for the next 24 hours you should not: ? ?Drive an automobile ?Make any legal decisions ?Drink any alcoholic beverage ? ? ?You may resume regular meals tomorrow.  Today it is better to start with liquids and gradually work up to solid foods. ? ?You may eat anything you prefer, but it is better to start with liquids, then soup and crackers, and gradually work up to solid foods. ? ? ?Please notify your doctor immediately if you have any unusual bleeding, trouble breathing, redness and pain at the surgery site, drainage, fever, or pain not relieved by medication. ? ? ? ?Additional Instructions: ? ? ? ?Please contact your physician with any problems or Same Day Surgery at [phone], Monday through Friday 6 am to 4 pm, or [place] at [place] Main number at [phone].  ?

## 2020-08-13 NOTE — Op Note (Signed)
Preoperative diagnosis: Metastatic lobular carcinoma of the left breast.  Postoperative diagnosis: Same.  Operative procedure: Left axillary dissection.  Operating Surgeon: Hervey Ard, MD.  Anesthesia: General endotracheal, Marcaine 0.5% with 1: 200,000 units of epinephrine, 30 cc.  Estimated blood loss: 10 cc.  Clinical note: This 70 year old woman had a large area of invasive lobular carcinoma involving the left breast.  04/06/2002 sentinel nodes were positive for metastatic disease with the largest measuring 11 mm.  Postoperative PET scan showed increased activity in the axilla and ultrasound suggested nodes with thickened cortex's.  She was felt to be a candidate for axillary dissection and review from the [facility] breast cancer tumor board.  The patient received Ancef prior to the procedure.  SCD stockings for DVT prevention.  Operative note: The superior axillary skin was cleaned with alcohol and 2 cc of 0.5% methylene blue instilled in the dermis for reverse axillary mapping.  The breast chest and axilla was then cleansed with ChloraPrep and draped.  An incision was made at the inferior aspect of the axilla extending to the latissimus muscle anteriorly.  The skin was incised sharply and remaining dissection completed with electrocautery.  This was deepened down to the chest wall.  A tedious dissection was undertaken to identify the axillary vein.  The dissection was maintained away from this to minimize the chance for postoperative lymphedema with plans for axillary radiation.  The thoracodorsal nerve artery and vein bundle as well as the long thoracic nerve of Bell were identified and protected.  Thickened and firm nodes in the apex of the axilla were identified and tagged with a Vicryl suture.  3 medium clips were placed at the apex of the axilla for identification and the radiation oncology department.  Hemostasis was good.  Harmonic scalpel was used for majority of the dissection.  A 15  [place] Blake drain was brought out through a separate stab wound incision and anchored into place with a 3-0 nylon suture.  The axillary envelope was closed with 2-0 Vicryl figure-of-eight sutures.  The layers of adipose tissue were controlled in similar fashion.  The skin was closed with a running 4-0 Vicryl subcuticular suture.  Benzoin, Steri-Strips, Telfa and Tegaderm dressings were applied both of the axillary incision and to the drain site.  Patient tolerated procedure well and taken to the PACU in stable condition.

## 2020-08-13 NOTE — Transfer of Care (Signed)
Immediate Anesthesia Transfer of Care Note  Patient: Victoria Dean  Procedure(s) Performed: AXILLARY NODE DISSECTION (Left)  Patient Location: PACU  Anesthesia Type:General  Level of Consciousness: awake  Airway & Oxygen Therapy: Patient Spontanous Breathing  Post-op Assessment: Report given to RN  Post vital signs: stable  Last Vitals:  Vitals Value Taken Time  BP 126/67 08/13/20 1602  Temp    Pulse 95 08/13/20 1607  Resp 14 08/13/20 1607  SpO2 93 % 08/13/20 1607  Vitals shown include unvalidated device data.  Last Pain:  Vitals:   08/13/20 1227  TempSrc: Temporal  PainSc: 5          Complications: No notable events documented.

## 2020-08-13 NOTE — Anesthesia Preprocedure Evaluation (Addendum)
Anesthesia Evaluation  Patient identified by MRN, date of birth, ID band Patient awake    Reviewed: Allergy & Precautions, NPO status , Patient's Chart, lab work & pertinent test results  History of Anesthesia Complications Negative for: history of anesthetic complications  Airway Mallampati: III  TM Distance: >3 FB Neck ROM: Full    Dental no notable dental hx. (+) Poor Dentition   Pulmonary shortness of breath and with exertion, neg sleep apnea, COPD (no inhalers), Not current smoker, former smoker,    Pulmonary exam normal        Cardiovascular hypertension, Pt. on medications (-) Past MI and (-) CHF Normal cardiovascular exam+ dysrhythmias (-) pacemaker(-) Valvular Problems/Murmurs     Neuro/Psych neg Seizures PSYCHIATRIC DISORDERS Anxiety Depression    GI/Hepatic Neg liver ROS, neg GERD  ,  Endo/Other  neg diabetesMorbid obesity  Renal/GU negative Renal ROS     Musculoskeletal  (+) Arthritis ,   Abdominal   Peds  Hematology  (+) Blood dyscrasia, anemia ,   Anesthesia Other Findings Anemia    Breast cancer ([place]) 04/2020 left Cataract    Emphysema of lung ([facility])  Family history of breast cancer  Family history of stomach cancer  Hypercholesteremia    Hypertension    Major depressive disorder   PVC (premature ventricular contraction)  sporatic  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    Vitamin D deficiency       Reproductive/Obstetrics                            Anesthesia Physical  Anesthesia Plan  ASA: 3  Anesthesia Plan: General   Post-op Pain Management:    Induction: Intravenous  PONV Risk Score and Plan: 3 and Ondansetron and Dexamethasone  Airway Management Planned: Oral ETT  Additional Equipment:   Intra-op Plan:   Post-operative Plan: Extubation in OR  Informed Consent: I have reviewed the patients History and  Physical, chart, labs and discussed the procedure including the risks, benefits and alternatives for the proposed anesthesia with the patient or authorized representative who has indicated his/her understanding and acceptance.       Plan Discussed with: CRNA, Anesthesiologist and Surgeon  Anesthesia Plan Comments:         Anesthesia Quick Evaluation

## 2020-08-13 NOTE — Anesthesia Postprocedure Evaluation (Signed)
Anesthesia Post Note  Patient: Victoria Dean  Procedure(s) Performed: AXILLARY NODE DISSECTION (Left)  Patient location during evaluation: PACU Anesthesia Type: General Level of consciousness: awake and alert, awake and oriented Pain management: pain level controlled Vital Signs Assessment: post-procedure vital signs reviewed and stable Respiratory status: spontaneous breathing, nonlabored ventilation and respiratory function stable Cardiovascular status: blood pressure returned to baseline and stable Postop Assessment: no apparent nausea or vomiting Anesthetic complications: no   No notable events documented.   Last Vitals:  Vitals:   08/13/20 1710 08/13/20 1721  BP: 117/64 137/64  Pulse: 92 97  Resp: 17 20  Temp:  (!) 36.1 C  SpO2: 92% 94%    Last Pain:  Vitals:   08/13/20 1721  TempSrc: Temporal  PainSc: 0-No pain                 Phill Mutter

## 2020-08-14 ENCOUNTER — Encounter: Payer: Self-pay | Admitting: General Surgery

## 2020-08-15 ENCOUNTER — Telehealth: Payer: Self-pay | Admitting: Oncology

## 2020-08-15 LAB — SURGICAL PATHOLOGY

## 2020-08-15 NOTE — Telephone Encounter (Signed)
Left VM with patient to notify her of appointments moved per her request to coordinate with radiation.

## 2020-08-23 ENCOUNTER — Telehealth: Payer: Self-pay | Admitting: *Deleted

## 2020-08-23 ENCOUNTER — Inpatient Hospital Stay: Payer: Medicare PPO | Attending: Oncology | Admitting: Oncology

## 2020-08-23 ENCOUNTER — Ambulatory Visit
Admission: RE | Admit: 2020-08-23 | Discharge: 2020-08-23 | Disposition: A | Discharge status: Home / Self Care | Payer: Medicare PPO | Source: Ambulatory Visit | Attending: Radiation Oncology | Admitting: Radiation Oncology

## 2020-08-23 ENCOUNTER — Encounter: Payer: Self-pay | Admitting: Oncology

## 2020-08-23 VITALS — BP 107/66 | HR 92 | Temp 96.2°F | Resp 18 | Wt 238.0 lb

## 2020-08-23 DIAGNOSIS — Z803 Family history of malignant neoplasm of breast: Secondary | ICD-10-CM | POA: Diagnosis not present

## 2020-08-23 DIAGNOSIS — Z170 Estrogen receptor positive status [ER+]: Secondary | ICD-10-CM | POA: Insufficient documentation

## 2020-08-23 DIAGNOSIS — E7800 Pure hypercholesterolemia, unspecified: Secondary | ICD-10-CM | POA: Insufficient documentation

## 2020-08-23 DIAGNOSIS — C773 Secondary and unspecified malignant neoplasm of axilla and upper limb lymph nodes: Secondary | ICD-10-CM | POA: Diagnosis not present

## 2020-08-23 DIAGNOSIS — C50412 Malignant neoplasm of upper-outer quadrant of left female breast: Secondary | ICD-10-CM | POA: Insufficient documentation

## 2020-08-23 DIAGNOSIS — I10 Essential (primary) hypertension: Secondary | ICD-10-CM | POA: Diagnosis not present

## 2020-08-23 DIAGNOSIS — D649 Anemia, unspecified: Secondary | ICD-10-CM | POA: Insufficient documentation

## 2020-08-23 DIAGNOSIS — F329 Major depressive disorder, single episode, unspecified: Secondary | ICD-10-CM | POA: Insufficient documentation

## 2020-08-23 DIAGNOSIS — Z87891 Personal history of nicotine dependence: Secondary | ICD-10-CM | POA: Diagnosis not present

## 2020-08-23 DIAGNOSIS — Z800 Family history of malignant neoplasm of digestive organs: Secondary | ICD-10-CM | POA: Insufficient documentation

## 2020-08-23 DIAGNOSIS — Z85828 Personal history of other malignant neoplasm of skin: Secondary | ICD-10-CM | POA: Insufficient documentation

## 2020-08-23 DIAGNOSIS — E559 Vitamin D deficiency, unspecified: Secondary | ICD-10-CM | POA: Diagnosis not present

## 2020-08-23 DIAGNOSIS — Z7189 Other specified counseling: Secondary | ICD-10-CM | POA: Diagnosis not present

## 2020-08-23 DIAGNOSIS — Z79899 Other long term (current) drug therapy: Secondary | ICD-10-CM | POA: Insufficient documentation

## 2020-08-23 DIAGNOSIS — F40240 Claustrophobia: Secondary | ICD-10-CM | POA: Diagnosis not present

## 2020-08-23 DIAGNOSIS — J439 Emphysema, unspecified: Secondary | ICD-10-CM | POA: Insufficient documentation

## 2020-08-23 DIAGNOSIS — I493 Ventricular premature depolarization: Secondary | ICD-10-CM | POA: Diagnosis not present

## 2020-08-23 NOTE — Progress Notes (Signed)
Hematology/Oncology Consult note [facility]  [phone] [phone]  Patient Care Team: Jerrol Banana., MD as PCP - General (Family Medicine) Lorelee Cover., MD as Consulting Physician (Ophthalmology) Dimmig, Marcello Moores, MD as Referring Physician (Orthopedic Surgery) Oneta Rack, MD (Dermatology) Dasher, Rayvon Char, MD (Dermatology)   Name of the patient: Victoria Dean  [id number]  1950-03-30   Date of visit: 08/23/20  Diagnosis- pathological prognostic stage Ib invasive lobular carcinoma of the left breast [id number] ER/PR positive HER2 negative  Chief complaint/ Reason for visit-discuss pathology results from repeat surgery and further management  Heme/Onc history:   Patient is a 70 year old female with a past medical history significant for claustrophobia, hypertension hypercholesterolemia, depression among other medical problems.  Patient began to notice retraction of her left nipple.  This was followed by a diagnostic mammogram which showed vague shadowing in the upper outer quadrant of the left breast without a definitive mass.  Benign cysts in the lower left breast measuring 1.5 cm.  No evidence of malignancy in the right breast.  No evidence of abnormal left axillary lymph nodes.  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   Bilateral breast MRI showed 7 x 6 x 4 cm area of non-mass enhancement in the upper outer left breast at 1 o'clock position.  This involves anterior and middle third of the breast and extension into the posterior third.  2 inferior left axillary lymph nodes with mild cortical thickening.   PET CT scan showed 1.7 cm hypermetabolic nodule in the midline of the neck which could represent thyroglossal cyst but malignancy cannot be  excluded. Patient underwent left-sided lumpectomy with nipple excision.     Final pathology showed invasive lobular carcinoma grade 210.5 cm with associated DCIS.  Skin/nipple are present and involved.  Invasive carcinoma invades the nipple without skin ulceration.  Lymphovascular invasion present.  Margins negative.  3 out of 4 lymph nodes positive for malignancy 11 mm largest deposit with extranodal extension.  pT3 pN1 a cM0  Patient was taken back for axillary dissection given 3 lymph nodes positive with extranodal extension and 4 more lymph nodes out of 9 were positive with extranodal extension   Interval history-patient still has breast drain in place after her recent axillary dissection.  She is here with her daughter today.  Recovering well from surgery so far  ECOG PS- 1 Pain scale- 2   Review of systems- Review of Systems  Constitutional:  Positive for malaise/fatigue. Negative for chills, fever and weight loss.  HENT:  Negative for congestion, ear discharge and nosebleeds.   Eyes:  Negative for blurred vision.  Respiratory:  Negative for cough, hemoptysis, sputum production, shortness of breath and wheezing.   Cardiovascular:  Negative for chest pain, palpitations, orthopnea and claudication.  Gastrointestinal:  Negative for abdominal pain, blood in stool, constipation, diarrhea, heartburn, melena, nausea and vomiting.  Genitourinary:  Negative for dysuria, flank pain, frequency, hematuria and urgency.  Musculoskeletal:  Negative for back pain, joint pain and myalgias.  Skin:  Negative for rash.  Neurological:  Negative for dizziness, tingling, focal weakness, seizures, weakness and headaches.  Endo/Heme/Allergies:  Does not bruise/bleed easily.  Psychiatric/Behavioral:  Negative for depression and suicidal ideas. The patient does not have insomnia.      Allergies  Allergen Reactions  Other Other (See Comments)    Brand name bandaids with cloth backing causes redness and  itching   Statins     Muscle weakness   Zetia [Ezetimibe]     Leg pain, upset stomach   Sulfa Antibiotics Rash     Past Medical History:  Diagnosis Date   Anemia    Breast cancer ([place]) 04/2020   left   Cataract    Dyspnea    Dysrhythmia    [place] of lung [place]    Family history of breast cancer    Family history of stomach cancer    Hypercholesteremia    Hypertension    Major depressive disorder    PVC (premature ventricular contraction)    sporatic   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    Vitamin D deficiency      Past Surgical History:  Procedure Laterality Date   ABDOMINAL HYSTERECTOMY  [place]   BREAST BIOPSY Right 1998   neg   BREAST BIOPSY Left 05/30/2020   stere bx x clip path pending   BREAST LUMPECTOMY WITH SENTINEL LYMPH NODE BIOPSY Left 06/29/2020   Procedure: BREAST LUMPECTOMY WITH SENTINEL LYMPH NODE BX;  Surgeon: Robert Bellow, MD;  Location: [facility] ORS;  Service: General;  Laterality: Left;   ESI     LUMBAR MICRODISCECTOMY  2019   L3-4   LUMBAR MICRODISCECTOMY  2008   L4-L5 repeated   MICRODISCECTOMY LUMBAR  2008   L4-5   MICRODISCECTOMY LUMBAR  2010   L4-L5 repeated   NODE DISSECTION Left 08/13/2020   Procedure: AXILLARY NODE DISSECTION;  Surgeon: Robert Bellow, MD;  Location: [facility] ORS;  Service: General;  Laterality: Left;   SQUAMOUS CELL CARCINOMA EXCISION     TONSILLECTOMY  1971    Social History   Socioeconomic History   Marital status: Widowed    Spouse name: Not on file   Number of children: 1   Years of education: Not on file   Highest education level: Bachelor's degree (e.g., BA, AB, BS)  Occupational History   Occupation: retired  Tobacco Use   Smoking status: Former    Packs/day: 0.91    Years: 33.00    Pack years: 30.03    Types: Cigarettes    Quit date: 04/19/2018    Years since quitting: 2.3   Smokeless tobacco: Never  Vaping Use   Vaping  Use: Never used  Substance and Sexual Activity   Alcohol use: Yes    Comment: rarely   Drug use: No   Sexual activity: Not Currently  Other Topics Concern   Not on file  Social History Narrative   Not on file   Social Determinants of Health   Financial Resource Strain: Not on file  Food Insecurity: Not on file  Transportation Needs: Not on file  Physical Activity: Not on file  Stress: Not on file  Social Connections: Not on file  Intimate Partner Violence: Not on file    Family History  Problem Relation Age of Onset   Breast cancer Mother 97   Osteosarcoma Mother        d. 44   Hypertension Brother    COPD Father    Hypertension Father    Breast cancer Paternal Aunt        dx 16s   Breast cancer Paternal Grandmother        dx 69s  Stomach cancer Paternal Grandfather        dx 34s   Breast cancer Paternal Aunt        dx 34s   Brain cancer Paternal Aunt        dx 59s     Current Outpatient Medications:    acetaminophen (TYLENOL) 650 MG CR tablet, Take 1,300 mg by mouth every 8 (eight) hours as needed for pain., Disp: , Rfl:    Biotin 1000 MCG tablet, Take 1,000 mcg by mouth daily., Disp: , Rfl:    Cholecalciferol (VITAMIN D3) 75 MCG (3000 UT) TABS, Take 3,000 Units by mouth daily., Disp: , Rfl:    diazepam (VALIUM) 5 MG tablet, Take 1 tablet (5 mg total) by mouth every 12 (twelve) hours as needed for anxiety. (Patient taking differently: Take 5 mg by mouth 2 (two) times daily.), Disp: 60 tablet, Rfl: 5   hydrocortisone 2.5 % lotion, Apply 1 application topically daily as needed (when washing face)., Disp: , Rfl:    losartan (COZAAR) 100 MG tablet, Take 1 tablet (100 mg total) by mouth daily., Disp: 90 tablet, Rfl: 4   mometasone (ELOCON) 0.1 % cream, Apply 1 application topically daily as needed (ear irritation)., Disp: , Rfl:    Polyethyl Glycol-Propyl Glycol (SYSTANE OP), Place 1 drop into both eyes daily as needed (dry eyes)., Disp: , Rfl:    rosuvastatin  (CRESTOR) 5 MG tablet, Take 1 tablet (5 mg total) by mouth daily. (Patient taking differently: Take 5 mg by mouth at bedtime.), Disp: 90 tablet, Rfl: 3   triamterene-hydrochlorothiazide (DYAZIDE) 37.5-25 MG capsule, Take 1 each (1 capsule total) by mouth daily., Disp: 90 capsule, Rfl: 4   venlafaxine XR (EFFEXOR XR) 75 MG 24 hr capsule, Take 3 capsules (225 mg total) by mouth daily with breakfast. (Patient taking differently: Take 150 mg by mouth at bedtime.), Disp: 270 capsule, Rfl: 1  Physical exam:  Vitals:   08/23/20 0932  BP: 107/66  Pulse: 92  Resp: 18  Temp: (!) 96.2 F (35.7 C)  TempSrc: Tympanic  SpO2: 99%  Weight: 238 lb (108 kg)   Physical Exam Constitutional:      Comments: She ambulates with a cane and appears in no acute distress  Cardiovascular:     Rate and Rhythm: Normal rate and regular rhythm.     Heart sounds: Normal heart sounds.  Pulmonary:     Effort: Pulmonary effort is normal.  Skin:    General: Skin is warm and dry.  Neurological:     Mental Status: She is alert and oriented to person, place, and time.    Right breast drain in place   CMP Latest Ref Rng & Units 06/20/2020  Glucose 70 - 99 mg/dL -  BUN 8 - 23 mg/dL -  Creatinine 0.44 - 1.00 mg/dL -  Sodium 135 - 145 mmol/L -  Potassium 3.5 - 5.2 mmol/L 4.2  Chloride 98 - 111 mmol/L -  CO2 22 - 32 mmol/L -  Calcium 8.9 - 10.3 mg/dL -  Total Protein 6.5 - 8.1 g/dL -  Total Bilirubin 0.3 - 1.2 mg/dL -  Alkaline Phos 38 - 126 U/L -  AST 15 - 41 U/L -  ALT 0 - 44 U/L -   CBC Latest Ref Rng & Units 02/23/2020  WBC 3.4 - 10.8 x10E3/uL 8.9  Hemoglobin 11.1 - 15.9 g/dL 11.9  Hematocrit 34.0 - 46.6 % 37.5  Platelets 150 - 450 x10E3/uL 274    No images  are attached to the encounter.  NM PET Image Initial (PI) Skull Base To Thigh  Result Date: 07/26/2020 CLINICAL DATA:  Initial treatment strategy for left breast carcinoma. Approximately 1 month status post breast lumpectomy and sentinel lymph node  biopsy. EXAM: NUCLEAR MEDICINE PET SKULL BASE TO THIGH TECHNIQUE: 12.9 mCi F-18 FDG was injected intravenously. Full-ring PET imaging was performed from the skull base to thigh after the radiotracer. CT data was obtained and used for attenuation correction and anatomic localization. Fasting blood glucose: 98 mg/dl COMPARISON:  Lung cancer screening chest CT on 03/27/2020 FINDINGS: Mediastinal blood-pool activity (background): SUV max = 3.3 Liver activity (reference): SUV max = N/A NECK: No hypermetabolic lymph nodes. A 1.7 cm nodule is seen in the midline between the hyoid bone and thyroid cartilage which is hypermetabolic, with SUV max of 15.3. No other hypermetabolic lesion seen. Incidental CT findings:  None. CHEST: Postop changes are seen in the left breast from recent lumpectomy. Multiple small left axillary lymph nodes are seen measuring up to 10 mm which show low-grade FDG uptake, with SUV max of 2.8. No other hypermetabolic masses or lymphadenopathy identified within the thorax. No suspicious pulmonary nodules seen on CT images. Incidental CT findings:  None. ABDOMEN/PELVIS: No abnormal hypermetabolic activity within the liver, pancreas, adrenal glands, or spleen. No hypermetabolic lymph nodes in the abdomen or pelvis. Incidental CT findings:  None. SKELETON: No focal hypermetabolic bone lesions to suggest skeletal metastasis. Incidental CT findings:  None. IMPRESSION: Postop changes from recent left breast lumpectomy and sentinel lymph node biopsy. Multiple small left axillary lymph nodes measuring up to 10 mm show low-grade FDG uptake,. While these may be postoperative/reactive etiology, metastatic disease cannot be excluded. No evidence of distant metastatic disease. 1.7 cm hypermetabolic nodule in the midline of the neck, between the hyoid bone and thyroid cartilage. 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 Recommend neck MRI without and with contrast for further evaluation. Electronically Signed   By: Marlaine Hind M.D.   On: 07/26/2020 10:03     Assessment and plan- Patient is a 70 y.o. 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  This will therefore constitute T3N2 disease.  Again discussed the results of predict testing which roughly translates to a chemotherapy benefit of 12% on top of endocrine therapy and adjuvant bisphosphonates.  We discussed the role of chemotherapy is to control micrometastatic disease and will need to be given prior to radiation therapy if it is considered.  Given patient's age as well asCardiovascular comorbidities I am hesitant to offer anthracycline based chemotherapy although anthracycline-based chemotherapy has better efficacy node positive breast cancer.  Discussed potential risks of anthracycline including all but not limited to cardiotoxicity and potential risk of leukemias  Discussed not anthracycline options including adjuvant TC chemotherapy which can be dose reduced and given IV every 3 weeks for 4 cycles if she can tolerate it.  Discussed risks and benefits of TC chemotherapy including all but not limited to nausea, vomiting, low blood counts, risk of infections and hospitalizations.  Risk of peripheral neuropathy associated with docetaxel.  Treatment will be  given with a curative intent  Less intense alternative would be only week Taxol x12 which has lesser chances of neuropathy but risk of neuropathy still exists.  In general weekly Taxol is better tolerated than TC chemotherapy.  Patient understands her options and is willing to  consider adjuvant TC chemotherapy.  We are still waiting for her right axillary wound to heal and her breast drain to come out.  I will tentatively see her back during the week of August 8 for adjuvant TC chemotherapy.  I did get in touch with Dr. Bary Castilla regarding port placement.  She will need chemo teach prior to that.  Discussed that 1.7 cm hypermetabolic nodule in the midline of the neck between the hyoid bone and thyroid cartilage can be further evaluated by an ultrasound or MRI and is unlikely to be related to her present breast cancer.  We can work it up down the line.  Patient would also benefit from hormone therapy for at least 5 years if not 10 years which I will discuss with her in greater detail after radiation is done.  She may qualify for adjuvant abemaciclib as well but we will check her Ki-67 on the pathology specimen   Visit Diagnosis 1. Goals of care, counseling/discussion   2. Malignant neoplasm of upper-outer quadrant of left breast in female, estrogen receptor positive ([place])      Dr. Randa Evens, MD, MPH [facility] [phone] 08/23/2020 3:54 PM

## 2020-08-23 NOTE — Progress Notes (Signed)
Here to discuss treatment planning. Seeing Dr Baruch Gouty today. L Breast still painful . Drainage tube still in place.

## 2020-08-23 NOTE — Telephone Encounter (Signed)
Called pt back this evening and she states that she does not want chemo. She was given ts options [facility] which is not good for her due to cardiac hx, then TC and she could have that or she can take Taxol weekly. Then Dr. Janese Banks suggested as last option AI drugs and radiation. Patient wanted to know the names of those drugs and it was femara, aromasin , anastrozole. She wanted the wide effect and I told her some of them are joint aches, nausea, hot flashes to name a few. The patient is going to think about all options and call me tomorrow to decide. She met radiation today .

## 2020-08-23 NOTE — Telephone Encounter (Signed)
Patient has questions regarding Dr. Elroy Channel discussion with her today about treatments discussed.

## 2020-08-23 NOTE — Consult Note (Signed)
NEW PATIENT EVALUATION  Name: Victoria Dean  MRN: [id number]  Date:   08/23/2020     DOB: [birth year]   This 70 y.o. 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  REFERRING PHYSICIAN: Jerrol Banana.,*  CHIEF COMPLAINT: No chief complaint on file.   DIAGNOSIS: The encounter diagnosis was Malignant neoplasm of upper-outer quadrant of left breast in female, estrogen receptor positive ([place]).   PREVIOUS INVESTIGATIONS:  Mammogram and ultrasound reviewed breast MRI reviewed Clinical notes reviewed Pathology report reviewed  HPI: Patient is a 70 year old female who presented with left nipple retraction.  Initial diagnostic mammogram showed only vague shadowing in the upper outer quadrant of left breast without definitive mass.  Ultrasound showed area of distortion and ultrasound-guided biopsy was positive for invasive l mammary carcinoma with focal lobular features.  Tumor was ER/PR positive HER2/neu not overexpressed.  Breast MRI showed a 7 x 6 x 4 cm area of none mass enhancement of the upper outer quadrant of the left breast at the 1 o'clock position.  2 inferior left axillary lymph nodes had mild cortical thickening.  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  She underwent a wide local excision for a 10.5 cm invasive lobular carcinoma.  Lymphovascular invasion was present.  Tumor directly invaded the nipple without skin ulceration.  Margins for status of invasive cancer were less than 1 mm all margins for DCIS were negative at 2 mm.  She initially had 3 lymph nodes with macro met metastatic disease.  4 lymph nodes were examined.  She then underwent axillary lymph node dissection with 4 of 9 lymph nodes  positive for metastatic carcinoma with the size of largest metastatic deposit 9 mm and extranodal spread identified.  She has been seen by medical oncology and has been recommended for systemic chemotherapy.  She still has drains present is otherwise doing well she specifically denies breast tenderness cough or bone pain.  Her initial PET scan showed no evidence of distant metastatic disease or bone involvement.  She is now referred to ration collagen for consideration of treatment. 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    PAST SURGICAL HISTORY:  Past Surgical History:  Procedure Laterality Date   ABDOMINAL HYSTERECTOMY  1995   APPENDECTOMY  1994   BREAST BIOPSY Right 1998   neg   BREAST BIOPSY Left 05/30/2020   stere bx x clip path pending   BREAST LUMPECTOMY WITH SENTINEL LYMPH NODE BIOPSY Left 06/29/2020   Procedure: BREAST LUMPECTOMY WITH SENTINEL LYMPH NODE BX;  Surgeon: Robert Bellow, MD;  Location: [facility] ORS;  Service: General;  Laterality: Left;   ESI     LUMBAR MICRODISCECTOMY  2019   L3-4   LUMBAR MICRODISCECTOMY  2008   L4-L5 repeated   MICRODISCECTOMY LUMBAR  2008   L4-5   MICRODISCECTOMY LUMBAR  2010   L4-L5  repeated   NODE DISSECTION Left 08/13/2020   Procedure: AXILLARY NODE DISSECTION;  Surgeon: Robert Bellow, MD;  Location: [facility] ORS;  Service: General;  Laterality: Left;   SQUAMOUS CELL CARCINOMA EXCISION     TONSILLECTOMY  1971    FAMILY HISTORY: family history includes Brain cancer in her paternal aunt;  Breast cancer in her paternal aunt, paternal aunt, and paternal grandmother; Breast cancer (age of onset: 39) in her mother; COPD in her father; Hypertension in her brother and father; Osteosarcoma in her mother; Stomach cancer in her paternal grandfather.  SOCIAL HISTORY:  reports that she quit smoking about 2 years ago. Her smoking use included cigarettes. She has a 30.03 pack-year smoking history. She has never used smokeless tobacco. She reports current alcohol use. She reports that she does not use drugs.  ALLERGIES: Other, Statins, Zetia [ezetimibe], and Sulfa antibiotics  MEDICATIONS:  Current Outpatient Medications  Medication Sig Dispense Refill   acetaminophen (TYLENOL) 650 MG CR tablet Take 1,300 mg by mouth every 8 (eight) hours as needed for pain.     Biotin 1000 MCG tablet Take 1,000 mcg by mouth daily.     Cholecalciferol (VITAMIN D3) 75 MCG (3000 UT) TABS Take 3,000 Units by mouth daily.     diazepam (VALIUM) 5 MG tablet Take 1 tablet (5 mg total) by mouth every 12 (twelve) hours as needed for anxiety. (Patient taking differently: Take 5 mg by mouth 2 (two) times daily.) 60 tablet 5   hydrocortisone 2.5 % lotion Apply 1 application topically daily as needed (when washing face).     losartan (COZAAR) 100 MG tablet Take 1 tablet (100 mg total) by mouth daily. 90 tablet 4   mometasone (ELOCON) 0.1 % cream Apply 1 application topically daily as needed (ear irritation).     Polyethyl Glycol-Propyl Glycol (SYSTANE OP) Place 1 drop into both eyes daily as needed (dry eyes).     rosuvastatin (CRESTOR) 5 MG tablet Take 1 tablet (5 mg total) by mouth daily. (Patient taking differently: Take 5 mg by mouth at bedtime.) 90 tablet 3   triamterene-hydrochlorothiazide (DYAZIDE) 37.5-25 MG capsule Take 1 each (1 capsule total) by mouth daily. 90 capsule 4   venlafaxine XR (EFFEXOR XR) 75 MG 24 hr capsule Take 3 capsules (225 mg total) by mouth daily with breakfast. (Patient taking differently: Take  150 mg by mouth at bedtime.) 270 capsule 1   No current facility-administered medications for this encounter.    ECOG PERFORMANCE STATUS:  0 - Asymptomatic  REVIEW OF SYSTEMS: Patient does have a history of claustrophobia hypertension hypercholesterolemia and depression Patient denies any weight loss, fatigue, weakness, fever, chills or night sweats. Patient denies any loss of vision, blurred vision. Patient denies any ringing  of the ears or hearing loss. No irregular heartbeat. Patient denies heart murmur or history of fainting. Patient denies any chest pain or pain radiating to her upper extremities. Patient denies any shortness of breath, difficulty breathing at night, cough or hemoptysis. Patient denies any swelling in the lower legs. Patient denies any nausea vomiting, vomiting of blood, or coffee ground material in the vomitus. Patient denies any stomach pain. Patient states has had normal bowel movements no significant constipation or diarrhea. Patient denies any dysuria, hematuria or significant nocturia. Patient denies any problems walking, swelling in the joints or loss of balance. Patient denies any skin changes, loss of hair or loss of weight. Patient denies any excessive worrying or anxiety or significant depression. Patient denies any  problems with insomnia. Patient denies excessive thirst, polyuria, polydipsia. Patient denies any swollen glands, patient denies easy bruising or easy bleeding. Patient denies any recent infections, allergies or URI. Patient "s visual fields have not changed significantly in recent time.   PHYSICAL EXAM: There were no vitals taken for this visit. She is status post wide local excision of the left breast with incision healing well.  She had significant volume loss of the left breast.  No dominant masses noted in either breast.  No axillary or supraclavicular adenopathy is identified she has 2 drains placed in her left axilla.  At areas healing well.  No  axillary or supraclavicular adenopathy is identified.  Well-developed well-nourished patient in NAD. HEENT reveals PERLA, EOMI, discs not visualized.  Oral cavity is clear. No oral mucosal lesions are identified. Neck is clear without evidence of cervical or supraclavicular adenopathy. Lungs are clear to A&P. Cardiac examination is essentially unremarkable with regular rate and rhythm without murmur rub or thrill. Abdomen is benign with no organomegaly or masses noted. Motor sensory and DTR levels are equal and symmetric in the upper and lower extremities. Cranial nerves II through XII are grossly intact. Proprioception is intact. No peripheral adenopathy or edema is identified. No motor or sensory levels are noted. Crude visual fields are within normal range.  LABORATORY DATA: Pathology report reviewed    RADIOLOGY RESULTS: Mammograms ultrasound and MRI scans and PET scans all reviewed compatible with above-stated findings   IMPRESSION: Stage IIIa invasive lobular carcinoma the left breast as post wide local excision and axillary lymph node dissection ER/PR positive HER2/neu negative in 70 year old female  PLAN: At this time patient will have systemic chemotherapy under Dr. Elroy Channel direction.  I will reevaluate her after completion of radiation for radiation therapy to her left breast and peripheral lymphatics.  We will plan on delivering 5040 cGy in 28 fractions boosting her scar another 1600 centigrade based on the extremely close margins of less than a millimeter.  Risks and benefits of treatment including skin reaction fatigue alteration of blood counts possible inclusion of superficial lung all were reviewed viewed in detail with the patient have also emphasized the need to exercise her left upper extremity to prevent lymphedema with her axillary lymph node dissection plus radiation that increases the odds of her developing lymphedema.  Patient and daughter both comprehend my treatment plan well.   Again we will set her up for simulation after reevaluation after chemotherapy.  Patient knows to call with any concerns.  I would like to take this opportunity to thank you for allowing me to participate in the care of your patient.Noreene Filbert, MD

## 2020-08-24 NOTE — Progress Notes (Signed)
Established patient visit   Patient: Victoria Dean   DOB: 1950-12-20   70 y.o. Female  MRN: [id number] Visit Date: 08/27/2020  Today's healthcare provider: [name], MD   Chief Complaint  Patient presents with   Depression   Follow-up   Hypertension   Subjective    HPI  Patient is doing well since her diagnosis of breast cancer.  PHQ-9 today is 5 which is improved from 12.  She feels as though she is holding her own emotionally. She has had an axillary dissection and has a drain in from this surgery and started noticing a little tenderness and local erythema yesterday morning.  Not really a lot of purulent drainage.  The surgery was just done 6 days ago.  She has had no fever chills or systemic symptoms. She has done well with her surgical intervention and is now ready to deal with possible chemotherapy and then possible radiation therapy. Depression, Follow-up  She  was last seen for this 2 months ago. Changes made at last visit include no medication changes. PHQ9 was 12 on LOV. May consider increase if symptoms worsen.    She reports good compliance with treatment. She is not having side effects.   She reports fair tolerance of treatment. Current symptoms include: fatigue She feels she is Unchanged since last visit.  Depression screen [facility] 2/9 08/27/2020 06/25/2020 05/15/2020  Decreased Interest '1 2 1  '$ Down, Depressed, Hopeless 1 2 0  PHQ - 2 Score '2 4 1  '$ Altered sleeping 0 2 0  Tired, decreased energy '1 3 1  '$ Change in appetite '2 2 2  '$ Feeling bad or failure about yourself  0 1 0  Trouble concentrating 0 0 0  Moving slowly or fidgety/restless 0 0 0  Suicidal thoughts 0 0 0  PHQ-9 Score '5 12 4  '$ Difficult doing work/chores Somewhat difficult Very difficult Not difficult at all  Some recent data might be hidden    Hypertension, follow-up  BP Readings from Last 3 Encounters:  08/27/20 113/67  08/23/20 107/66  08/13/20 137/64   Wt Readings from Last 3  Encounters:  08/27/20 239 lb (108.4 kg)  08/23/20 238 lb (108 kg)  08/13/20 235 lb (106.6 kg)     She was last seen for hypertension 2 months ago.  BP at that visit was 123/60. Management since that visit includes no medication changes.  She reports good compliance with treatment. She is not having side effects.  She is following a Regular diet. She is not exercising. She does not smoke.  Use of agents associated with hypertension: none.   Outside blood pressures are checked occasionally. Symptoms: No chest pain No chest pressure  No palpitations No syncope  No dyspnea No orthopnea  No paroxysmal nocturnal dyspnea No lower extremity edema   Pertinent labs: Lab Results  Component Value Date   CHOL 151 02/23/2020   HDL 53 02/23/2020   LDLCALC 77 02/23/2020   TRIG 119 02/23/2020   CHOLHDL 2.8 02/23/2020   Lab Results  Component Value Date   NA 138 06/08/2020   K 4.2 06/20/2020   CREATININE 1.00 06/08/2020   GFRNONAA >60 06/08/2020   GFRAA 46 (L) 02/23/2020   GLUCOSE 92 06/08/2020     The 10-year ASCVD risk score Mikey Bussing DC Jr., et al., 2013) is: 10.4%       Medications: Outpatient Medications Prior to Visit  Medication Sig   hydrocortisone 2.5 % lotion Apply 1 application  topically daily as needed (when washing face).   losartan (COZAAR) 100 MG tablet Take 1 tablet (100 mg total) by mouth daily.   mometasone (ELOCON) 0.1 % cream Apply 1 application topically daily as needed (ear irritation).   acetaminophen (TYLENOL) 650 MG CR tablet Take 1,300 mg by mouth every 8 (eight) hours as needed for pain.   Biotin 1000 MCG tablet Take 1,000 mcg by mouth daily.   Cholecalciferol (VITAMIN D3) 75 MCG (3000 UT) TABS Take 3,000 Units by mouth daily.   diazepam (VALIUM) 5 MG tablet Take 1 tablet (5 mg total) by mouth every 12 (twelve) hours as needed for anxiety. (Patient taking differently: Take 5 mg by mouth 2 (two) times daily.)   Polyethyl Glycol-Propyl Glycol (SYSTANE OP)  Place 1 drop into both eyes daily as needed (dry eyes).   rosuvastatin (CRESTOR) 5 MG tablet Take 1 tablet (5 mg total) by mouth daily. (Patient taking differently: Take 5 mg by mouth at bedtime.)   triamterene-hydrochlorothiazide (DYAZIDE) 37.5-25 MG capsule Take 1 each (1 capsule total) by mouth daily.   venlafaxine XR (EFFEXOR XR) 75 MG 24 hr capsule Take 3 capsules (225 mg total) by mouth daily with breakfast. (Patient taking differently: Take 150 mg by mouth at bedtime.)   No facility-administered medications prior to visit.    Review of Systems  Constitutional:  Negative for activity change and fatigue.  Respiratory:  Negative for cough and shortness of breath.   Cardiovascular:  Negative for chest pain, palpitations and leg swelling.  Endocrine: Negative for cold intolerance, heat intolerance, polydipsia, polyphagia and polyuria.  Musculoskeletal:  Positive for arthralgias.  Skin:  Positive for wound. Negative for color change, pallor and rash.  Neurological:  Negative for dizziness, light-headedness and headaches.  Psychiatric/Behavioral:  Negative for self-injury, sleep disturbance and suicidal ideas. The patient is not nervous/anxious and is not hyperactive.        Objective    BP 113/67   Pulse 93   Temp 98 F (36.7 C)   Resp 16   Ht '5\' 3"'$  (1.6 m)   Wt 239 lb (108.4 kg)   BMI 42.34 kg/m  BP Readings from Last 3 Encounters:  08/27/20 113/67  08/23/20 107/66  08/13/20 137/64   Wt Readings from Last 3 Encounters:  08/27/20 239 lb (108.4 kg)  08/23/20 238 lb (108 kg)  08/13/20 235 lb (106.6 kg)       Physical Exam Vitals reviewed. Exam conducted with a chaperone present.  Constitutional:      Appearance: Normal appearance. She is well-developed.  HENT:     Head: Normocephalic and atraumatic.     Right Ear: External ear normal.     Left Ear: External ear normal.     Mouth/Throat:     Pharynx: Oropharynx is clear.  Eyes:     General: No scleral icterus.     Conjunctiva/sclera: Conjunctivae normal.     Pupils: Pupils are equal, round, and reactive to light.  Neck:     Vascular: No carotid bruit.  Cardiovascular:     Rate and Rhythm: Normal rate and regular rhythm.     Pulses: Normal pulses.     Heart sounds: Normal heart sounds.  Pulmonary:     Effort: Pulmonary effort is normal.     Breath sounds: Normal breath sounds.  Chest:  Breasts:    Right: No mass.     Left: No mass.  Abdominal:     Palpations: Abdomen is soft.  Musculoskeletal:  Cervical back: Normal range of motion and neck supple.  Lymphadenopathy:     Cervical: No cervical adenopathy.  Skin:    General: Skin is warm and dry.     Comments: Very fair skin. The axillary scar is healing nicely and the drain is in place.  There is an area of slight erythema around it without any purulent drainage able to be expressed. Picture is taken and put in the chart of this area.  Neurological:     General: No focal deficit present.     Mental Status: She is alert and oriented to person, place, and time.     Deep Tendon Reflexes: Reflexes are normal and symmetric.  Psychiatric:        Mood and Affect: Mood normal.        Behavior: Behavior normal.        Thought Content: Thought content normal.        Judgment: Judgment normal.      No results found for any visits on 08/27/20.  Assessment & Plan     1. Malignant neoplasm of upper-outer quadrant of left breast in female, estrogen receptor positive ([place]) She is doing well from surgical intervention and axillary dissection. Have sent pictures on the chart for Dr. Bary Castilla to review.   2. Mild episode of recurrent major depressive disorder ([place]) Clinically improved on increased dose of Effexor at 225 daily I will follow her up in 3 months. 3. Essential (primary) hypertension Stable on losartan and Dyazide  4. Class 2 severe obesity due to excess calories with serious comorbidity and body mass index (BMI) of 39.0 to 39.9 in  adult ([place])   5. Chronic bilateral low back pain with bilateral sciatica May need referral back to new spine surgeon with [facility].  Her old Psychologist, sport and exercise has retired.   No follow-ups on file.      I, [name], MD, have reviewed all documentation for this visit. The documentation on 08/27/20 for the exam, diagnosis, procedures, and orders are all accurate and complete.    Eloyse Causey Cranford Mon, MD  [facility] [phone] (phone) [phone] (fax)  [place]

## 2020-08-27 ENCOUNTER — Other Ambulatory Visit: Payer: Self-pay

## 2020-08-27 ENCOUNTER — Telehealth: Payer: Self-pay | Admitting: *Deleted

## 2020-08-27 ENCOUNTER — Ambulatory Visit: Payer: Medicare PPO | Admitting: Family Medicine

## 2020-08-27 VITALS — BP 113/67 | HR 93 | Temp 98.0°F | Resp 16 | Ht 63.0 in | Wt 239.0 lb

## 2020-08-27 DIAGNOSIS — M5441 Lumbago with sciatica, right side: Secondary | ICD-10-CM

## 2020-08-27 DIAGNOSIS — I10 Essential (primary) hypertension: Secondary | ICD-10-CM | POA: Diagnosis not present

## 2020-08-27 DIAGNOSIS — C50412 Malignant neoplasm of upper-outer quadrant of left female breast: Secondary | ICD-10-CM

## 2020-08-27 DIAGNOSIS — G8929 Other chronic pain: Secondary | ICD-10-CM | POA: Diagnosis not present

## 2020-08-27 DIAGNOSIS — Z170 Estrogen receptor positive status [ER+]: Secondary | ICD-10-CM

## 2020-08-27 DIAGNOSIS — F330 Major depressive disorder, recurrent, mild: Secondary | ICD-10-CM

## 2020-08-27 DIAGNOSIS — Z6839 Body mass index (BMI) 39.0-39.9, adult: Secondary | ICD-10-CM

## 2020-08-27 DIAGNOSIS — M5442 Lumbago with sciatica, left side: Secondary | ICD-10-CM

## 2020-08-27 NOTE — Telephone Encounter (Signed)
I got a secure chat about wht chemo to teach for tom. At the [employer] sent the message the pt had not made up her mind. At first she was no to any chemo . Then she called end of last week and wanted to think about AI. Then her last comment was that she will think about it and call me back but she did not call back. I asked her if she was able to make decision and she said taxotere. I left secrure chat to tell [facility]

## 2020-08-28 ENCOUNTER — Other Ambulatory Visit: Payer: Self-pay | Admitting: General Surgery

## 2020-08-28 ENCOUNTER — Inpatient Hospital Stay: Payer: Medicare PPO

## 2020-08-28 ENCOUNTER — Ambulatory Visit: Payer: Medicare PPO | Admitting: Oncology

## 2020-08-28 NOTE — Progress Notes (Addendum)
Subjective:     Patient ID: Victoria Dean is a 70 y.o. female.   HPI   The following portions of the patient's history were reviewed and updated as appropriate.   This an established patient is here today for: office visit. The patient is here today for a JP drain check. Patient called in earlier today with reports of a discharge. The area was hot and very painful. She redressed it with area with antibiotic cream. Patient did see her primary care physician, Dr. Rosanna Randy, this morning. He did take a picture of the area that is available under the media section in the [facility] system. Patient reported that her temperature this morning was 98.0 and she denies any chills. The patient did not bring her drain sheet in today. She states she has been draining 50-60 cc's in the day and around 30 cc's at night.    Patient had a left axillary dissection done on 08-13-20 and a left lumpectomy on 06-29-20.   She is also her today to discuss a port placement.       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 Bary Castilla   MASTECTOMY PARTIAL / LUMPECTOMY Left 06/29/2020    with SLN biopsy   MICRODISCECTOMY LUMBAR   2008   squamous cell carcinoma excision       TONSILLECTOMY   1971                OB History     Gravida   1   Para  1   Term      Preterm      AB      Living         SAB      IAB      Ectopic      Molar      Multiple      Live Births           Obstetric Comments  Age at first period 19 Age of first pregnancy 27             Social History           Socioeconomic History   Marital status: Widowed  Tobacco Use   Smoking status: Former Smoker      Packs/day: 0.91      Years: 33.00      Pack years: 30.03      Quit date: 04/19/2018  Years since quitting: 2.3   Smokeless tobacco: Never Used  Substance and Sexual Activity   Alcohol use: Yes      Comment: occasionally   Drug use: Never             Allergies  Allergen Reactions   Statins-Hmg-Coa Reductase Inhibitors Other (See Comments)      Muscle weakness   Sulfa (Sulfonamide Antibiotics) Unknown   Zetia [Ezetimibe] Other (See Comments)      Leg pain and upset stomach      Current Medications        Current Outpatient Medications  Medication Sig Dispense Refill   acetaminophen (TYLENOL) 500 MG tablet Take by mouth       biotin 1 mg tablet Take 1,000 mcg by mouth once daily       cholecalciferol, vitamin D3, 75 mcg (3,000 unit) Tab Take by mouth once daily       diazePAM (VALIUM) 5 MG tablet Take by mouth       hydrocortisone 2.5 % cream Apply topically continuously as needed       losartan (COZAAR) 100 MG tablet Take 100 mg by mouth once daily       mometasone (ELOCON) 0.1 % cream Apply topically continuously as needed       rosuvastatin (CRESTOR) 5 MG tablet Take 5 mg by mouth once daily       triamterene-hydrochlorothiazide (DYAZIDE) 37.5-25 mg capsule Take by mouth       venlafaxine (EFFEXOR-XR) 75 MG XR capsule Take by mouth 2 (two) times daily One capsule in the morning and one capsule at night.       lidocaine-prilocaine (EMLA) cream Apply to areola one hour prior to arrival day of procedure. Cover with saran wrap. (Patient not taking: No sig reported) 5 g 0    No current  facility-administered medications for this visit.             Family History  Problem Relation Age of Onset   Bone cancer Mother     Breast cancer Mother 37   High blood pressure (Hypertension) Father     COPD Father     High blood pressure (Hypertension) Brother            Review of Systems      Objective:   Physical Exam Constitutional:      Appearance: Normal appearance.  Cardiovascular:     Rate and Rhythm: Normal rate and regular rhythm.  Pulmonary:     Effort: Pulmonary effort is normal.     Breath sounds: Normal breath sounds.  Neurological:     Mental Status: She is alert.     To centimeter area of faint erythema around the drain site, no purulent drainage.  Drainage fluid is typical serous.  No evidence of undrained axillary fluid.  Wide excision site is healing well.    Assessment:     Mechanical irritation from the JP drain, possible local contamination.    Plan:     The patient will make use of a small amount of the Emla cream and Neosporin to apply to the drain site 1-2 times a day for comfort.  Continue local wound care with baby wipes.  Avoid alcohol to the area.   Follow-up in 1 week.   She did have good visit with medical oncology and is now amenable to adjuvant chemotherapy for her metastatic invasive lobular cancer.  We will reviewed the PowerPort procedure.   This may need to  be put on hold if there is any evidence of active infection at the axillary wound.   Chemotherapy will need to be postponed till the axilla is dry, otherwise will be fighting a chronic seroma for years.   Office visit follow up as scheduled.    Port placement to be scheduled for next week.     This note is partially prepared by Ledell Noss, CMA acting as a scribe in the presence of Dr. Hervey Ard, MD.    The documentation recorded by the scribe accurately reflects the service I personally performed and the decisions made by me.    Robert Bellow, MD  FACS  September 04, 2020:     Patient ID: Victoria Dean is a 70 y.o. female.   HPI   The following portions of the patient's history were reviewed and updated as appropriate.   This an established patient is here today for: office visit. The patient is here today to follow up from a left lumpectomy done on 06-29-20.   She is also post left axillary dissection that is scheduled for 08-13-20.   She has a drain. She states the discomfort is at the drain site.   Port placement is scheduled for tomorrow.   Review of Systems  Constitutional: Negative for chills and fever.  Respiratory: Negative for cough.          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 Bary Castilla   MASTECTOMY PARTIAL / LUMPECTOMY Left 06/29/2020    with SLN biopsy   MICRODISCECTOMY LUMBAR   2008   squamous cell carcinoma excision       TONSILLECTOMY   1971                OB History     Gravida  1   Para  1   Term      Preterm      AB      Living         SAB      IAB      Ectopic      Molar      Multiple      Live Births           Obstetric Comments  Age at first period 33 Age of first pregnancy 1             Social History  Socioeconomic History   Marital status: Widowed  Tobacco  Use   Smoking status: Former Smoker      Packs/day: 0.91      Years: 33.00      Pack years: 30.03      Quit date: 04/19/2018      Years since quitting: 2.3   Smokeless tobacco: Never Used  Substance and Sexual Activity   Alcohol use: Yes      Comment: occasionally   Drug use: Never             Allergies  Allergen Reactions   Statins-Hmg-Coa Reductase Inhibitors Other (See Comments)      Muscle weakness   Sulfa (Sulfonamide Antibiotics) Unknown   Zetia [Ezetimibe] Other (See Comments)      Leg pain and upset stomach      Current Medications        Current Outpatient Medications  Medication Sig Dispense Refill   acetaminophen (TYLENOL) 500 MG tablet Take by mouth       biotin 1 mg tablet Take 1,000 mcg by mouth once daily       cholecalciferol, vitamin D3, 75 mcg (3,000 unit) Tab Take by mouth once daily       diazePAM (VALIUM) 5 MG tablet Take by mouth       hydrocortisone 2.5 % cream Apply topically continuously as needed       lidocaine-prilocaine (EMLA) cream Apply to areola one hour prior to arrival day of procedure. Cover with saran wrap. 5 g 0   losartan (COZAAR) 100 MG tablet Take 100 mg by mouth once daily       mometasone (ELOCON) 0.1 % cream Apply topically continuously as needed       rosuvastatin (CRESTOR) 5 MG tablet Take 5 mg by mouth once daily       triamterene-hydrochlorothiazide (DYAZIDE) 37.5-25 mg capsule Take by mouth       venlafaxine (EFFEXOR-XR) 75 MG XR capsule Take by mouth 2 (two) times daily One capsule in the morning and one capsule at night.        No current facility-administered medications for this visit.             Family History  Problem Relation Age of Onset   Bone cancer Mother     Breast cancer Mother 57   High blood pressure (Hypertension) Father     COPD Father     High blood pressure (Hypertension) Brother                 Objective:   Physical Exam Exam conducted with a chaperone present.  Constitutional:       Appearance: Normal appearance.  Cardiovascular:     Rate and Rhythm: Normal rate and regular rhythm.     Pulses: Normal pulses.     Heart sounds: Normal heart sounds.  Pulmonary:     Effort: Pulmonary effort is normal.     Breath sounds: Normal breath sounds.  Musculoskeletal:     Cervical back: Neck supple.  Skin:    General: Skin is warm and dry.  Neurological:     Mental Status: She is alert and oriented to person, place, and time.  Psychiatric:        Mood and Affect: Mood normal.        Behavior: Behavior normal.      Labs and Radiology:    Drain record shows volumes down to under 25 cc/cc/day for the last 4 days.  Ultrasound:   Limited ultrasound of the wide excision site showed perhaps 2 cc in the area of previous aspiration, insufficient volume to require repeat.  Adjacent soft tissue showed mild edema associated with postsurgical state. No images.       Assessment:     Doing well post wide excision and subsequent axillary dissection for invasive lobular carcinoma of the left breast.   Candidate for adjuvant chemotherapy.    Plan:     We reviewed the port placement procedure.  Chest marked for proper port placement.   We will arrange for a follow-up examination in 9 days to assess for any fluid accumulation post drain removal.      This note is partially prepared by Karie Fetch, RN, acting as a scribe in the presence of Dr. Hervey Ard, MD.  The documentation recorded by the scribe accurately reflects the service I personally performed and the decisions made by me.    Robert Bellow, MD FACS

## 2020-09-04 DIAGNOSIS — C50412 Malignant neoplasm of upper-outer quadrant of left female breast: Secondary | ICD-10-CM | POA: Diagnosis not present

## 2020-09-04 MED ORDER — ORAL CARE MOUTH RINSE: 15.0000 mL | Freq: Once | OROMUCOSAL | Status: AC

## 2020-09-04 MED ORDER — CHLORHEXIDINE GLUCONATE CLOTH 2 % EX PADS: 6.0000 | MEDICATED_PAD | Freq: Once | CUTANEOUS | Status: DC

## 2020-09-04 MED ORDER — CEFAZOLIN SODIUM-DEXTROSE 2-4 GM/100ML-% IV SOLN
2.0000 g | INTRAVENOUS | Status: AC
  Administered 2020-09-05: 2 g via INTRAVENOUS

## 2020-09-04 MED ORDER — LACTATED RINGERS IV SOLN: INTRAVENOUS | Status: DC

## 2020-09-04 MED ORDER — FAMOTIDINE 20 MG PO TABS: 20.0000 mg | ORAL_TABLET | Freq: Once | ORAL | Status: DC

## 2020-09-04 MED ORDER — CHLORHEXIDINE GLUCONATE CLOTH 2 % EX PADS
6.0000 | MEDICATED_PAD | Freq: Once | CUTANEOUS | Status: AC
  Administered 2020-09-05: 6 via TOPICAL

## 2020-09-04 MED ORDER — CHLORHEXIDINE GLUCONATE 0.12 % MT SOLN: 15.0000 mL | Freq: Once | OROMUCOSAL | Status: AC

## 2020-09-05 ENCOUNTER — Encounter: Payer: Self-pay | Admitting: General Surgery

## 2020-09-05 ENCOUNTER — Ambulatory Visit: Payer: Medicare PPO | Admitting: Anesthesiology

## 2020-09-05 ENCOUNTER — Encounter
Admission: RE | Disposition: A | Discharge status: Home / Self Care | Payer: Self-pay | Source: Home / Self Care | Attending: General Surgery

## 2020-09-05 ENCOUNTER — Ambulatory Visit: Payer: Medicare PPO

## 2020-09-05 ENCOUNTER — Ambulatory Visit
Admission: RE | Admit: 2020-09-05 | Discharge: 2020-09-05 | Disposition: A | Discharge status: Home / Self Care | Payer: Medicare PPO | Attending: General Surgery | Admitting: General Surgery

## 2020-09-05 ENCOUNTER — Other Ambulatory Visit: Payer: Self-pay

## 2020-09-05 DIAGNOSIS — Z882 Allergy status to sulfonamides status: Secondary | ICD-10-CM | POA: Insufficient documentation

## 2020-09-05 DIAGNOSIS — Z79899 Other long term (current) drug therapy: Secondary | ICD-10-CM | POA: Insufficient documentation

## 2020-09-05 DIAGNOSIS — D649 Anemia, unspecified: Secondary | ICD-10-CM | POA: Diagnosis not present

## 2020-09-05 DIAGNOSIS — Z87891 Personal history of nicotine dependence: Secondary | ICD-10-CM | POA: Insufficient documentation

## 2020-09-05 DIAGNOSIS — C50912 Malignant neoplasm of unspecified site of left female breast: Secondary | ICD-10-CM | POA: Diagnosis not present

## 2020-09-05 DIAGNOSIS — Z90710 Acquired absence of both cervix and uterus: Secondary | ICD-10-CM | POA: Insufficient documentation

## 2020-09-05 DIAGNOSIS — I10 Essential (primary) hypertension: Secondary | ICD-10-CM | POA: Insufficient documentation

## 2020-09-05 DIAGNOSIS — Z888 Allergy status to other drugs, medicaments and biological substances status: Secondary | ICD-10-CM | POA: Diagnosis not present

## 2020-09-05 DIAGNOSIS — Z95828 Presence of other vascular implants and grafts: Secondary | ICD-10-CM

## 2020-09-05 DIAGNOSIS — Z452 Encounter for adjustment and management of vascular access device: Secondary | ICD-10-CM | POA: Diagnosis not present

## 2020-09-05 DIAGNOSIS — J439 Emphysema, unspecified: Secondary | ICD-10-CM | POA: Diagnosis not present

## 2020-09-05 DIAGNOSIS — Z9049 Acquired absence of other specified parts of digestive tract: Secondary | ICD-10-CM | POA: Insufficient documentation

## 2020-09-05 DIAGNOSIS — E7800 Pure hypercholesterolemia, unspecified: Secondary | ICD-10-CM | POA: Diagnosis not present

## 2020-09-05 DIAGNOSIS — C50412 Malignant neoplasm of upper-outer quadrant of left female breast: Secondary | ICD-10-CM | POA: Diagnosis not present

## 2020-09-05 HISTORY — PX: PORTACATH PLACEMENT: SHX2246

## 2020-09-05 SURGERY — INSERTION, TUNNELED CENTRAL VENOUS DEVICE, WITH PORT
Anesthesia: Monitor Anesthesia Care | Laterality: Right

## 2020-09-05 MED ORDER — ONDANSETRON HCL 4 MG/2ML IJ SOLN
INTRAMUSCULAR | Status: DC | PRN
  Administered 2020-09-05: 4 mg via INTRAVENOUS

## 2020-09-05 MED ORDER — OXYCODONE HCL 5 MG PO TABS
ORAL_TABLET | ORAL | Status: AC
  Filled 2020-09-05: qty 1

## 2020-09-05 MED ORDER — OXYCODONE HCL 5 MG/5ML PO SOLN: 5.0000 mg | Freq: Once | ORAL | Status: AC | PRN

## 2020-09-05 MED ORDER — SODIUM CHLORIDE (PF) 0.9 % IJ SOLN
INTRAMUSCULAR | Status: DC | PRN
  Administered 2020-09-05: 10 mL

## 2020-09-05 MED ORDER — ACETAMINOPHEN 160 MG/5ML PO SOLN
325.0000 mg | ORAL | Status: DC | PRN
  Filled 2020-09-05: qty 20.3

## 2020-09-05 MED ORDER — ACETAMINOPHEN 325 MG PO TABS: 325.0000 mg | ORAL_TABLET | ORAL | Status: DC | PRN

## 2020-09-05 MED ORDER — PROPOFOL 10 MG/ML IV BOLUS
INTRAVENOUS | Status: AC
  Filled 2020-09-05: qty 20

## 2020-09-05 MED ORDER — MIDAZOLAM HCL 2 MG/2ML IJ SOLN
INTRAMUSCULAR | Status: DC | PRN
  Administered 2020-09-05: 2 mg via INTRAVENOUS

## 2020-09-05 MED ORDER — MEPERIDINE HCL 25 MG/ML IJ SOLN: 6.2500 mg | INTRAMUSCULAR | Status: DC | PRN

## 2020-09-05 MED ORDER — DEXMEDETOMIDINE (PRECEDEX) IN NS 20 MCG/5ML (4 MCG/ML) IV SYRINGE
PREFILLED_SYRINGE | INTRAVENOUS | Status: DC | PRN
  Administered 2020-09-05: 8 ug via INTRAVENOUS
  Administered 2020-09-05: 12 ug via INTRAVENOUS

## 2020-09-05 MED ORDER — ACETAMINOPHEN 10 MG/ML IV SOLN: 1000.0000 mg | Freq: Once | INTRAVENOUS | Status: DC | PRN

## 2020-09-05 MED ORDER — MIDAZOLAM HCL 2 MG/2ML IJ SOLN
INTRAMUSCULAR | Status: AC
  Filled 2020-09-05: qty 2

## 2020-09-05 MED ORDER — SODIUM CHLORIDE (PF) 0.9 % IJ SOLN
INTRAMUSCULAR | Status: AC
  Filled 2020-09-05: qty 50

## 2020-09-05 MED ORDER — FENTANYL CITRATE (PF) 100 MCG/2ML IJ SOLN
INTRAMUSCULAR | Status: AC
  Filled 2020-09-05: qty 2

## 2020-09-05 MED ORDER — LACTATED RINGERS IV SOLN: INTRAVENOUS | Status: DC

## 2020-09-05 MED ORDER — PROPOFOL 10 MG/ML IV BOLUS
INTRAVENOUS | Status: AC
  Filled 2020-09-05: qty 40

## 2020-09-05 MED ORDER — LIDOCAINE HCL 1 % IJ SOLN
INTRAMUSCULAR | Status: DC | PRN
  Administered 2020-09-05: 15 mL

## 2020-09-05 MED ORDER — HYDROMORPHONE HCL 1 MG/ML IJ SOLN: 0.2500 mg | INTRAMUSCULAR | Status: DC | PRN

## 2020-09-05 MED ORDER — PROMETHAZINE HCL 25 MG/ML IJ SOLN: 12.5000 mg | Freq: Once | INTRAMUSCULAR | Status: DC | PRN

## 2020-09-05 MED ORDER — LIDOCAINE HCL (PF) 1 % IJ SOLN
INTRAMUSCULAR | Status: AC
  Filled 2020-09-05: qty 30

## 2020-09-05 MED ORDER — CHLORHEXIDINE GLUCONATE 0.12 % MT SOLN
OROMUCOSAL | Status: AC
  Administered 2020-09-05: 15 mL via OROMUCOSAL
  Filled 2020-09-05: qty 15

## 2020-09-05 MED ORDER — SEVOFLURANE IN SOLN
RESPIRATORY_TRACT | Status: AC
  Filled 2020-09-05: qty 250

## 2020-09-05 MED ORDER — FENTANYL CITRATE (PF) 100 MCG/2ML IJ SOLN
INTRAMUSCULAR | Status: DC | PRN
  Administered 2020-09-05: 100 ug via INTRAVENOUS

## 2020-09-05 MED ORDER — CEFAZOLIN SODIUM-DEXTROSE 2-4 GM/100ML-% IV SOLN
INTRAVENOUS | Status: AC
  Filled 2020-09-05: qty 100

## 2020-09-05 MED ORDER — OXYCODONE HCL 5 MG PO TABS
5.0000 mg | ORAL_TABLET | Freq: Once | ORAL | Status: AC | PRN
  Administered 2020-09-05: 5 mg via ORAL

## 2020-09-05 MED ORDER — FAMOTIDINE 20 MG PO TABS
ORAL_TABLET | ORAL | Status: AC
  Filled 2020-09-05: qty 1

## 2020-09-05 MED ORDER — PROPOFOL 500 MG/50ML IV EMUL
INTRAVENOUS | Status: DC | PRN
  Administered 2020-09-05: 130 ug/kg/min via INTRAVENOUS

## 2020-09-05 SURGICAL SUPPLY — 39 items
APL PRP STRL LF DISP 70% ISPRP (MISCELLANEOUS) ×1
BAG DECANTER FOR FLEXI CONT (MISCELLANEOUS) ×2 IMPLANT
BLADE SURG 15 STRL SS SAFETY (BLADE) ×2 IMPLANT
CHLORAPREP W/TINT 26 (MISCELLANEOUS) ×2 IMPLANT
COVER LIGHT HANDLE STERIS (MISCELLANEOUS) ×4 IMPLANT
COVER PROBE FLX POLY STRL (MISCELLANEOUS) ×2 IMPLANT
DECANTER SPIKE VIAL GLASS SM (MISCELLANEOUS) ×4 IMPLANT
DRAPE C-ARM XRAY 36X54 (DRAPES) ×2 IMPLANT
DRAPE LAPAROTOMY TRNSV 106X77 (MISCELLANEOUS) ×2 IMPLANT
DRSG TEGADERM 2-3/8X2-3/4 SM (GAUZE/BANDAGES/DRESSINGS) ×2 IMPLANT
DRSG TEGADERM 4X4.75 (GAUZE/BANDAGES/DRESSINGS) ×2 IMPLANT
DRSG TELFA 4X3 1S NADH ST (GAUZE/BANDAGES/DRESSINGS) ×2 IMPLANT
ELECT CAUTERY BLADE TIP 2.5 (TIP) ×2
ELECT REM PT RETURN 9FT ADLT (ELECTROSURGICAL) ×2
ELECTRODE CAUTERY BLDE TIP 2.5 (TIP) ×1 IMPLANT
ELECTRODE REM PT RTRN 9FT ADLT (ELECTROSURGICAL) ×1 IMPLANT
GAUZE 4X4 16PLY ~~LOC~~+RFID DBL (SPONGE) ×2 IMPLANT
GLOVE SRG 8 PF TXTR STRL LF DI (GLOVE) ×3 IMPLANT
GLOVE SURG ENC MOIS LTX SZ7.5 (GLOVE) ×6 IMPLANT
GLOVE SURG UNDER POLY LF SZ8 (GLOVE) ×6
GOWN STRL REUS W/ TWL LRG LVL3 (GOWN DISPOSABLE) ×2 IMPLANT
GOWN STRL REUS W/TWL LRG LVL3 (GOWN DISPOSABLE) ×4
IV NS 500ML (IV SOLUTION) ×2
IV NS 500ML BAXH (IV SOLUTION) ×1 IMPLANT
KIT PORT POWER 8FR ISP CVUE (Port) ×2 IMPLANT
KIT TURNOVER KIT A (KITS) ×2 IMPLANT
LABEL OR SOLS (LABEL) ×2 IMPLANT
MANIFOLD NEPTUNE II (INSTRUMENTS) ×2 IMPLANT
PACK LAP CHOLECYSTECTOMY (MISCELLANEOUS) ×2 IMPLANT
PACK PORT-A-CATH (MISCELLANEOUS) IMPLANT
PENCIL ELECTRO HAND CTR (MISCELLANEOUS) ×2 IMPLANT
STRIP CLOSURE SKIN 1/2X4 (GAUZE/BANDAGES/DRESSINGS) ×2 IMPLANT
SUT PROLENE 3 0 SH DA (SUTURE) ×2 IMPLANT
SUT VIC AB 3-0 SH 27 (SUTURE) ×2
SUT VIC AB 3-0 SH 27X BRD (SUTURE) ×1 IMPLANT
SUT VIC AB 4-0 FS2 27 (SUTURE) ×2 IMPLANT
SWABSTK COMLB BENZOIN TINCTURE (MISCELLANEOUS) ×2 IMPLANT
SYR 10ML LL (SYRINGE) ×2 IMPLANT
SYR 10ML SLIP (SYRINGE) ×2 IMPLANT

## 2020-09-05 NOTE — Discharge Instructions (Addendum)

## 2020-09-05 NOTE — Transfer of Care (Signed)
Immediate Anesthesia Transfer of Care Note  Patient: Victoria Dean  Procedure(s) Performed: INSERTION PORT-A-CATH (Right)  Patient Location: PACU  Anesthesia Type:General  Level of Consciousness: drowsy  Airway & Oxygen Therapy: Patient Spontanous Breathing and Patient connected to face mask oxygen  Post-op Assessment: Report given to RN  Post vital signs: stable  Last Vitals:  Vitals Value Taken Time  BP    Temp    Pulse 90 09/05/20 1241  Resp 22 09/05/20 1241  SpO2 100 % 09/05/20 1241  Vitals shown include unvalidated device data.  Last Pain:  Vitals:   09/05/20 1042  TempSrc: Temporal  PainSc: 4          Complications: No notable events documented.

## 2020-09-05 NOTE — Op Note (Signed)
Preoperative diagnosis: Breast cancer, need for adjuvant chemotherapy.  Postoperative diagnosis: Same.  Operative procedure: Right subclavian PowerPort placement with ultrasound guidance.  Operating Surgeon: Hervey Ard, MD.  Anesthesia: Attended local, 15 cc 1% plain Xylocaine.  Estimated blood loss: 2 cc.  Clinical note: This 70 year old woman has invasive lobular carcinoma of the left breast with multiple positive lymph nodes.  She was felt to be a candidate for adjuvant chemotherapy.  Central venous access was requested by her treating oncologist.  The patient received Ancef prior to the procedure.  Operative note: The patient tolerated anesthesia well.  She was placed supine on the table and in Trendelenburg position.  Ultrasound was used to interrogate the subclavian vein which was incredibly tiny.  The internal jugular vein was assessed and was very large.  Local anesthesia was instilled and the vein cannulated.  Initial attempts to pass the guidewire were unsuccessful but after repeat cannulation and improved angulation of the catheter the guidewire advanced easily into the SVC following the IVC.  The dilator was passed and the catheter inserted.  Under fluoroscopy this is placed near the junction of the SVC and right atrium.  The catheter was then tunneled to a pocket on the right anterior chest.  The port was anchored to the deep tissue with 3-0 Prolene sutures x2.  The port was placed where there was the thinnest amount of adipose tissue on the chest wall.  The catheter easily irrigated and aspirated with the patient in the supine position.  The wound was closed with a running 3-0 Vicryl suture to the adipose layer followed by a running 4-0 Vicryl subcuticular suture.  The IJ puncture site was closed with a single 4-0 Vicryl subcuticular suture.  Benzoin, Steri-Strips, Telfa and Tegaderm dressings were applied.  Rec portable chest x-ray in the recovery room showed the catheter tip as  described above.  No evidence of pneumothorax.  The patient tolerated the procedure well.

## 2020-09-05 NOTE — H&P (Signed)
Victoria Dean IV:[id number] [birth year]     HPI:  70 y/o woman who is a candidate for adjuvant chemotherapy.  Central venous access requested by the treating oncologist.   Medications Prior to Admission  Medication Sig Dispense Refill Last Dose   acetaminophen (TYLENOL) 650 MG CR tablet Take 1,300 mg by mouth every 8 (eight) hours as needed for pain.   09/04/2020   Biotin 1000 MCG tablet Take 1,000 mcg by mouth daily.   09/04/2020   cholecalciferol (VITAMIN D) 25 MCG (1000 UNIT) tablet Take 1,000 Units by mouth daily.   09/04/2020   diazepam (VALIUM) 5 MG tablet Take 1 tablet (5 mg total) by mouth every 12 (twelve) hours as needed for anxiety. (Patient taking differently: Take 5 mg by mouth in the morning.) 60 tablet 5 09/04/2020   hydrocortisone 2.5 % lotion Apply 1 application topically daily as needed (when washing face).   09/04/2020   losartan (COZAAR) 100 MG tablet Take 1 tablet (100 mg total) by mouth daily. 90 tablet 4 09/04/2020   Menthol, Topical Analgesic, (BIOFREEZE EX) Apply 1 application topically daily as needed (back pain).   09/04/2020   mometasone (ELOCON) 0.1 % cream Apply 1 application topically daily as needed (ear irritation).   09/04/2020   Polyethyl Glycol-Propyl Glycol (SYSTANE OP) Place 1 drop into both eyes daily as needed (dry eyes).   09/04/2020   rosuvastatin (CRESTOR) 5 MG tablet Take 1 tablet (5 mg total) by mouth daily. (Patient taking differently: Take 5 mg by mouth at bedtime.) 90 tablet 3 09/04/2020   triamterene-hydrochlorothiazide (DYAZIDE) 37.5-25 MG capsule Take 1 each (1 capsule total) by mouth daily. 90 capsule 4 09/04/2020   venlafaxine XR (EFFEXOR XR) 75 MG 24 hr capsule Take 3 capsules (225 mg total) by mouth daily with breakfast. (Patient taking differently: Take 150 mg by mouth at bedtime.) 270 capsule 1 09/04/2020   Allergies  Allergen Reactions   Other Itching    Brand name bandaids with cloth backing causes redness and itching   Statins     Muscle weakness   Zetia  [Ezetimibe]     Leg pain, upset stomach   Sulfa Antibiotics Rash   Past Medical History:  Diagnosis Date   Anemia    Breast cancer ([place]) 04/2020   left   Cataract    Dyspnea    Dysrhythmia    Emphysema of lung ([place])    Family history of breast cancer    Family history of stomach cancer    Hypercholesteremia    Hypertension    Major depressive disorder    PVC (premature ventricular contraction)    sporatic   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    Vitamin D deficiency    Past Surgical History:  Procedure Laterality Date   [place]   BREAST BIOPSY Right 1998   neg   BREAST BIOPSY Left 05/30/2020   stere bx x clip path pending   BREAST LUMPECTOMY WITH SENTINEL LYMPH NODE BIOPSY Left 06/29/2020   Procedure: BREAST LUMPECTOMY WITH SENTINEL LYMPH NODE BX;  Surgeon: Robert Bellow, MD;  Location: [facility] ORS;  Service: General;  Laterality: Left;   ESI     LUMBAR MICRODISCECTOMY  2019   L3-4   LUMBAR MICRODISCECTOMY  2008   L4-L5 repeated   MICRODISCECTOMY LUMBAR  2008   L4-5   MICRODISCECTOMY LUMBAR  2010  L4-L5 repeated   NODE DISSECTION Left 08/13/2020   Procedure: AXILLARY NODE DISSECTION;  Surgeon: Robert Bellow, MD;  Location: [facility] ORS;  Service: General;  Laterality: Left;   SQUAMOUS CELL CARCINOMA EXCISION     TONSILLECTOMY  1971   Social History   Socioeconomic History   Marital status: Widowed    Spouse name: Not on file   Number of children: 1   Years of education: Not on file   Highest education level: Bachelor's degree (e.g., BA, AB, BS)  Occupational History   Occupation: retired  Tobacco Use   Smoking status: Former    Packs/day: 0.91    Years: 33.00    Pack years: 30.03    Types: Cigarettes    Quit date: 04/19/2018    Years since quitting: 2.3   Smokeless tobacco: Never  Vaping Use   Vaping Use: Never used  Substance and Sexual Activity   Alcohol  use: Yes    Comment: rarely   Drug use: No   Sexual activity: Not Currently  Other Topics Concern   Not on file  Social History Narrative   Not on file   Social Determinants of Health   Financial Resource Strain: Not on file  Food Insecurity: Not on file  Transportation Needs: Not on file  Physical Activity: Not on file  Stress: Not on file  Social Connections: Not on file  Intimate Partner Violence: Not on file   Social History   Social History Narrative   Not on file     ROS: Negative.     PE: HEENT: Negative. Lungs: Clear. Cardio: RR.  Assessment/Plan:  Proceed with right power port placement.   [name] Jeremy Mclamb 09/05/2020

## 2020-09-05 NOTE — Anesthesia Postprocedure Evaluation (Signed)
Anesthesia Post Note  Patient: Victoria Dean  Procedure(s) Performed: INSERTION PORT-A-CATH (Right)  Patient location during evaluation: PACU Anesthesia Type: MAC Level of consciousness: awake and alert Pain management: pain level controlled Vital Signs Assessment: post-procedure vital signs reviewed and stable Respiratory status: spontaneous breathing, nonlabored ventilation, respiratory function stable and patient connected to nasal cannula oxygen Cardiovascular status: stable and blood pressure returned to baseline Postop Assessment: no apparent nausea or vomiting Anesthetic complications: no   No notable events documented.   Last Vitals:  Vitals:   09/05/20 1327 09/05/20 1335  BP:  140/61  Pulse:  86  Resp:  16  Temp: (!) 36.4 C (!) 36.4 C  SpO2: 96% 93%    Last Pain:  Vitals:   09/05/20 1335  TempSrc: Temporal  PainSc: [place]

## 2020-09-05 NOTE — Progress Notes (Signed)
Dr. Bary Castilla read the xray and okayed patient to be d/c'ed home.

## 2020-09-05 NOTE — Anesthesia Preprocedure Evaluation (Addendum)
Anesthesia Evaluation  Patient identified by MRN, date of birth, ID band Patient awake    Reviewed: Allergy & Precautions, NPO status , Patient's Chart, lab work & pertinent test results, reviewed documented beta blocker date and time   History of Anesthesia Complications Negative for: history of anesthetic complications  Airway Mallampati: III  TM Distance: <3 FB Neck ROM: Limited    Dental no notable dental hx. (+) Poor Dentition   Pulmonary shortness of breath and with exertion, COPD (emphysema - no inhalers ), former smoker,    Pulmonary exam normal        Cardiovascular hypertension, Pt. on medications Normal cardiovascular exam     Neuro/Psych PSYCHIATRIC DISORDERS Anxiety Depression negative neurological ROS     GI/Hepatic negative GI ROS, Neg liver ROS,   Endo/Other  Morbid obesity  Renal/GU negative Renal ROS     Musculoskeletal  (+) Arthritis ,   Abdominal   Peds  Hematology  (+) anemia ,   Anesthesia Other Findings     Breast cancer 04/2020 left   06/2020 [place] 3 grade II view    PVC (premature ventricular contraction)    HLD    Reproductive/Obstetrics                            Anesthesia Physical Anesthesia Plan  ASA: 3  Anesthesia Plan: MAC and General   Post-op Pain Management:    Induction:   PONV Risk Score and Plan:   Airway Management Planned:   Additional Equipment:   Intra-op Plan:   Post-operative Plan:   Informed Consent: I have reviewed the patients History and Physical, chart, labs and discussed the procedure including the risks, benefits and alternatives for the proposed anesthesia with the patient or authorized representative who has indicated his/her understanding and acceptance.       Plan Discussed with: CRNA  Anesthesia Plan Comments: (Patient felt suffocated with pre-oxygenation prior to [place] in May 2022 - explained today she will  have nasal cannula or face mask and shouldn't feel this way.  Natural airway )       Anesthesia Quick Evaluation

## 2020-09-06 ENCOUNTER — Other Ambulatory Visit: Payer: Self-pay | Admitting: *Deleted

## 2020-09-06 DIAGNOSIS — C50412 Malignant neoplasm of upper-outer quadrant of left female breast: Secondary | ICD-10-CM

## 2020-09-07 ENCOUNTER — Telehealth: Payer: Self-pay | Admitting: *Deleted

## 2020-09-07 ENCOUNTER — Other Ambulatory Visit: Payer: Self-pay | Admitting: Oncology

## 2020-09-07 ENCOUNTER — Other Ambulatory Visit: Payer: Self-pay | Admitting: *Deleted

## 2020-09-07 DIAGNOSIS — Z170 Estrogen receptor positive status [ER+]: Secondary | ICD-10-CM

## 2020-09-07 DIAGNOSIS — C50412 Malignant neoplasm of upper-outer quadrant of left female breast: Secondary | ICD-10-CM

## 2020-09-07 MED ORDER — PROCHLORPERAZINE MALEATE 10 MG PO TABS: 10.0000 mg | ORAL_TABLET | Freq: Four times a day (QID) | ORAL | 1 refills | Status: DC | PRN

## 2020-09-07 MED ORDER — ONDANSETRON HCL 8 MG PO TABS: 8.0000 mg | ORAL_TABLET | Freq: Two times a day (BID) | ORAL | 1 refills | Status: DC | PRN

## 2020-09-07 MED ORDER — LIDOCAINE-PRILOCAINE 2.5-2.5 % EX CREA: TOPICAL_CREAM | CUTANEOUS | 3 refills | Status: DC

## 2020-09-07 MED ORDER — LIDOCAINE-PRILOCAINE 2.5-2.5 % EX CREA
TOPICAL_CREAM | CUTANEOUS | 3 refills | Status: DC
Start: 2020-09-07 — End: 2020-09-07

## 2020-09-07 NOTE — Telephone Encounter (Signed)
Patient called asking that the EMLA cream be sent to pharmacy since she is for chemotherapy Monday, she would like to pick it up by tomorrow morning if at all possible at [facility]

## 2020-09-07 NOTE — Telephone Encounter (Signed)
Called patient to let her know that the premeds that we have for her for her starting her chemo on 8 8 had been sent to a mail in pharmacy and I have called and canceled that and sent all of her meds which she has Emla cream ,Compazine and Zofran to [facility] and we look forward to seeing her on 8/ 8 for her first chemotherapy infusion.  This message was left on her voicemail because she did not pick up

## 2020-09-07 NOTE — Telephone Encounter (Signed)
Patient called back and wanted to speak to me about her chemo.  She does want Taxol and I told her that Dr. Janese Banks tried calling her today but only a voicemail.  She is set up for Taxol however premeds have been sent to her pharmacy and she will be here on Monday, August 8 to start her new treatment and all the authorization for her treatment has been approved.  Patient is good with this plan and she will be here on Monday

## 2020-09-07 NOTE — Progress Notes (Signed)
START OFF PATHWAY REGIMEN - Breast   OFF00010:Paclitaxel 80 mg/m2 Weekly:   Administer weekly:     Paclitaxel   **Always confirm dose/schedule in your pharmacy ordering system**  Patient Characteristics: Postoperative without Neoadjuvant Therapy (Pathologic Staging), Invasive Disease, Adjuvant Therapy, HER2 Negative/Unknown/Equivocal, ER Positive, Node Positive, Node Positive (4+) Therapeutic Status: Postoperative without Neoadjuvant Therapy (Pathologic Staging) AJCC Grade: G2 AJCC N Category: pN2 AJCC M Category: cM0 ER Status: Positive (+) AJCC 8 Stage Grouping: IB HER2 Status: Negative (-) Oncotype Dx Recurrence Score: Not Appropriate AJCC T Category: pT3 PR Status: Positive (+) Adjuvant Therapy Status: No Adjuvant Therapy Received Yet or Changing Initial Adjuvant Regimen due to Tolerance Intent of Therapy: Curative Intent, Discussed with Patient

## 2020-09-09 ENCOUNTER — Other Ambulatory Visit: Payer: Self-pay | Admitting: *Deleted

## 2020-09-09 DIAGNOSIS — C50412 Malignant neoplasm of upper-outer quadrant of left female breast: Secondary | ICD-10-CM

## 2020-09-10 ENCOUNTER — Inpatient Hospital Stay: Payer: Medicare PPO | Attending: Oncology

## 2020-09-10 ENCOUNTER — Encounter: Payer: Self-pay | Admitting: Oncology

## 2020-09-10 ENCOUNTER — Inpatient Hospital Stay: Payer: Medicare PPO | Admitting: Oncology

## 2020-09-10 ENCOUNTER — Inpatient Hospital Stay: Payer: Medicare PPO

## 2020-09-10 VITALS — BP 138/76 | HR 88

## 2020-09-10 VITALS — BP 106/47 | HR 80 | Temp 97.9°F | Resp 18 | Ht 63.0 in | Wt 235.0 lb

## 2020-09-10 DIAGNOSIS — C50412 Malignant neoplasm of upper-outer quadrant of left female breast: Secondary | ICD-10-CM | POA: Insufficient documentation

## 2020-09-10 DIAGNOSIS — Z5111 Encounter for antineoplastic chemotherapy: Secondary | ICD-10-CM | POA: Insufficient documentation

## 2020-09-10 DIAGNOSIS — Z170 Estrogen receptor positive status [ER+]: Secondary | ICD-10-CM

## 2020-09-10 DIAGNOSIS — R0602 Shortness of breath: Secondary | ICD-10-CM | POA: Diagnosis not present

## 2020-09-10 DIAGNOSIS — Z79899 Other long term (current) drug therapy: Secondary | ICD-10-CM | POA: Insufficient documentation

## 2020-09-10 LAB — CBC WITH DIFFERENTIAL/PLATELET
Abs Immature Granulocytes: 0.03 10*3/uL (ref 0.00–0.07)
Basophils Absolute: 0.1 10*3/uL (ref 0.0–0.1)
Basophils Relative: 1 %
Eosinophils Absolute: 0.2 10*3/uL (ref 0.0–0.5)
Eosinophils Relative: 3 %
HCT: 34.5 % — ABNORMAL LOW (ref 36.0–46.0)
Hemoglobin: 11 g/dL — ABNORMAL LOW (ref 12.0–15.0)
Immature Granulocytes: 0 %
Lymphocytes Relative: 32 %
Lymphs Abs: 2.3 10*3/uL (ref 0.7–4.0)
MCH: 29.2 pg (ref 26.0–34.0)
MCHC: 31.9 g/dL (ref 30.0–36.0)
MCV: 91.5 fL (ref 80.0–100.0)
Monocytes Absolute: 0.8 10*3/uL (ref 0.1–1.0)
Monocytes Relative: 11 %
Neutro Abs: 3.7 10*3/uL (ref 1.7–7.7)
Neutrophils Relative %: 53 %
Platelets: 265 10*3/uL (ref 150–400)
RBC: 3.77 MIL/uL — ABNORMAL LOW (ref 3.87–5.11)
RDW: 14.5 % (ref 11.5–15.5)
WBC: 7.1 10*3/uL (ref 4.0–10.5)
nRBC: 0 % (ref 0.0–0.2)

## 2020-09-10 LAB — COMPREHENSIVE METABOLIC PANEL
ALT: 27 U/L (ref 0–44)
AST: 25 U/L (ref 15–41)
Albumin: 3.7 g/dL (ref 3.5–5.0)
Alkaline Phosphatase: 119 U/L (ref 38–126)
Anion gap: 7 (ref 5–15)
BUN: 21 mg/dL (ref 8–23)
CO2: 32 mmol/L (ref 22–32)
Calcium: 8.9 mg/dL (ref 8.9–10.3)
Chloride: 98 mmol/L (ref 98–111)
Creatinine, Ser: 0.88 mg/dL (ref 0.44–1.00)
GFR, Estimated: >60 mL/min (ref >60)
Glucose, Bld: 102 mg/dL — ABNORMAL HIGH (ref 70–99)
Potassium: 3.8 mmol/L (ref 3.5–5.1)
Sodium: 137 mmol/L (ref 135–145)
Total Bilirubin: 0.3 mg/dL (ref 0.3–1.2)
Total Protein: 6.9 g/dL (ref 6.5–8.1)

## 2020-09-10 LAB — HEPATITIS B SURFACE ANTIGEN: Hepatitis B Surface Ag: NONREACTIVE

## 2020-09-10 LAB — HEPATITIS B CORE ANTIBODY, TOTAL: Hep B Core Total Ab: NONREACTIVE

## 2020-09-10 MED ORDER — FAMOTIDINE 20 MG IN NS 100 ML IVPB
20.0000 mg | Freq: Once | INTRAVENOUS | Status: AC
  Administered 2020-09-10: 20 mg via INTRAVENOUS
  Filled 2020-09-10: qty 20

## 2020-09-10 MED ORDER — SODIUM CHLORIDE 0.9 % IV SOLN
Freq: Once | INTRAVENOUS | Status: AC
  Filled 2020-09-10: qty 250

## 2020-09-10 MED ORDER — HEPARIN SOD (PORK) LOCK FLUSH 100 UNIT/ML IV SOLN
500.0000 [IU] | Freq: Once | INTRAVENOUS | Status: AC
  Filled 2020-09-10: qty 5

## 2020-09-10 MED ORDER — SODIUM CHLORIDE 0.9 % IV SOLN
65.0000 mg/m2 | Freq: Once | INTRAVENOUS | Status: AC
  Administered 2020-09-10: 144 mg via INTRAVENOUS
  Filled 2020-09-10: qty 24

## 2020-09-10 MED ORDER — SODIUM CHLORIDE 0.9 % IV SOLN
10.0000 mg | Freq: Once | INTRAVENOUS | Status: AC
  Administered 2020-09-10: 10 mg via INTRAVENOUS
  Filled 2020-09-10: qty 10

## 2020-09-10 MED ORDER — HEPARIN SOD (PORK) LOCK FLUSH 100 UNIT/ML IV SOLN
500.0000 [IU] | Freq: Once | INTRAVENOUS | Status: AC | PRN
  Administered 2020-09-10: 500 [IU]
  Filled 2020-09-10: qty 5

## 2020-09-10 MED ORDER — SODIUM CHLORIDE 0.9% FLUSH
10.0000 mL | INTRAVENOUS | Status: DC | PRN
  Administered 2020-09-10: 10 mL via INTRAVENOUS
  Filled 2020-09-10: qty 10

## 2020-09-10 MED ORDER — DIPHENHYDRAMINE HCL 50 MG/ML IJ SOLN
50.0000 mg | Freq: Once | INTRAMUSCULAR | Status: AC
  Administered 2020-09-10: 50 mg via INTRAVENOUS
  Filled 2020-09-10: qty 1

## 2020-09-10 NOTE — Progress Notes (Signed)
Hematology/Oncology Consult note [facility]  [phone] [phone]  Patient Care Team: Jerrol Banana., MD as PCP - General (Family Medicine) Lorelee Cover., MD as Consulting Physician (Ophthalmology) Dimmig, Marcello Moores, MD as Referring Physician (Orthopedic Surgery) Oneta Rack, MD (Dermatology) Dasher, Rayvon Char, MD (Dermatology)   Name of the patient: Victoria Dean  [id number]  06-19-1950   Date of visit: 09/10/20  Diagnosis- pathological prognostic stage Ib invasive lobular carcinoma of the left breast [id number] ER/PR positive HER2 negative  Chief complaint/ Reason for visit-on treatment assessment prior to cycle 1 of adjuvant Taxol chemotherapy  Heme/Onc history: Patient is a 70 year old female with a past medical history significant for claustrophobia, hypertension hypercholesterolemia, depression among other medical problems.  Patient began to notice retraction of her left nipple.  This was followed by a diagnostic mammogram which showed vague shadowing in the upper outer quadrant of the left breast without a definitive mass.  Benign cysts in the lower left breast measuring 1.5 cm.  No evidence of malignancy in the right breast.  No evidence of abnormal left axillary lymph nodes.  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   Bilateral breast MRI showed 7 x 6 x 4 cm area of non-mass enhancement in the upper outer left breast at 1 o'clock position.  This involves anterior and middle third of the breast and extension into the posterior third.  2 inferior left axillary lymph nodes with mild cortical thickening.   PET CT scan showed 1.7 cm hypermetabolic nodule in the midline of the neck which could represent thyroglossal cyst but malignancy cannot be  excluded. Patient underwent left-sided lumpectomy with nipple excision.     Final pathology showed invasive lobular carcinoma grade 210.5 cm with associated DCIS.  Skin/nipple are present and involved.  Invasive carcinoma invades the nipple without skin ulceration.  Lymphovascular invasion present.  Margins negative.  3 out of 4 lymph nodes positive for malignancy 11 mm largest deposit with extranodal extension.  pT3 pN1 a cM0   Patient was taken back for axillary dissection given 3 lymph nodes positive with extranodal extension and 4 more lymph nodes out of 9 were positive with extranodal extension  Given cardiovascular comorbidities and patient age none anthracycline adjuvant chemotherapy options were discussed.  Patient did not wish to proceed with TC chemotherapy and has decided with weekly Taxol chemotherapy    Interval history-drain is out and patient is healing well postsurgery.  She has baseline chronic low back pain Some issues with balance because of that.  She walks with a cane but has not had any recent falls  ECOG PS- 1 Pain scale- 0  Review of systems- Review of Systems  Constitutional:  Negative for chills, fever, malaise/fatigue and weight loss.  HENT:  Negative for congestion, ear discharge and nosebleeds.   Eyes:  Negative for blurred vision.  Respiratory:  Negative for cough, hemoptysis, sputum production, shortness of breath and wheezing.   Cardiovascular:  Negative for chest pain, palpitations, orthopnea and claudication.  Gastrointestinal:  Negative for abdominal pain, blood in stool, constipation, diarrhea, heartburn, melena, nausea and vomiting.  Genitourinary:  Negative for dysuria, flank pain, frequency, hematuria and urgency.  Musculoskeletal:  Negative for back pain, joint pain and myalgias.  Skin:  Negative for rash.  Neurological:  Negative  for dizziness, tingling, focal weakness, seizures, weakness and headaches.  Endo/Heme/Allergies:  Does not bruise/bleed  easily.  Psychiatric/Behavioral:  Negative for depression and suicidal ideas. The patient does not have insomnia.     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    Vitamin D deficiency      Past Surgical History:  Procedure Laterality Date   ABDOMINAL HYSTERECTOMY  1995   APPENDECTOMY  1994   BREAST BIOPSY Right 1998   neg   BREAST BIOPSY Left 05/30/2020   stere bx x clip path pending   BREAST LUMPECTOMY WITH SENTINEL LYMPH NODE BIOPSY Left 06/29/2020   Procedure: BREAST LUMPECTOMY WITH SENTINEL LYMPH NODE BX;  Surgeon: Robert Bellow, MD;  Location: [facility] ORS;  Service: General;  Laterality: Left;   ESI     LUMBAR MICRODISCECTOMY  2019   L3-4   LUMBAR MICRODISCECTOMY  2008   L4-L5 repeated   MICRODISCECTOMY LUMBAR  2008   L4-5   MICRODISCECTOMY LUMBAR  2010   L4-L5 repeated   NODE DISSECTION Left 08/13/2020   Procedure: AXILLARY NODE DISSECTION;  Surgeon: Robert Bellow, MD;  Location: [facility] ORS;  Service: General;  Laterality: Left;   PORTACATH PLACEMENT Right 09/05/2020   Procedure: INSERTION PORT-A-CATH;  Surgeon: Robert Bellow, MD;  Location: [facility] ORS;  Service: General;  Laterality: Right;  right; monitor anesthesia care   SQUAMOUS CELL CARCINOMA EXCISION     TONSILLECTOMY  1971    Social History   Socioeconomic History   Marital status:  Widowed    Spouse name: Not on file   Number of children: 1   Years of education: Not on file   Highest education level: Bachelor's degree (e.g., BA, AB, BS)  Occupational History   Occupation: retired  Tobacco Use   Smoking status: Former    Packs/day: 0.91    Years: 33.00    Pack years: 30.03    Types: Cigarettes    Quit date: 04/19/2018    Years since quitting: 2.3   Smokeless tobacco: Never  Vaping Use   Vaping Use: Never used  Substance and Sexual Activity   Alcohol use: Yes    Comment: rarely   Drug use: No   Sexual activity: Not Currently  Other Topics Concern   Not on file  Social History Narrative   Not on file   Social Determinants of Health   Financial Resource Strain: Not on file  Food Insecurity: Not on file  Transportation Needs: Not on file  Physical Activity: Not on file  Stress: Not on file  Social Connections: Not on file  Intimate Partner Violence: Not on file    Family History  Problem Relation Age of Onset   Breast cancer Mother 20   Osteosarcoma Mother        d. 24   Hypertension Brother    COPD Father    Hypertension Father    Breast cancer Paternal Aunt        dx 69s   Breast cancer Paternal Grandmother        dx 45s   Stomach cancer Paternal Grandfather        dx 14s   Breast cancer Paternal Aunt        dx 26s   Brain cancer Paternal Aunt        dx 59s     Current Outpatient Medications:    acetaminophen (TYLENOL) 650 MG CR tablet, Take 1,300 mg by mouth every 8 (eight) hours as needed for pain., Disp: , Rfl:    Biotin 1000 MCG tablet, Take 1,000 mcg by mouth daily., Disp: , Rfl:    cholecalciferol (VITAMIN D) 25 MCG (1000 UNIT) tablet, Take 1,000 Units by mouth daily., Disp: , Rfl:    diazepam (VALIUM) 5 MG tablet, Take 1 tablet (5 mg total) by mouth every 12 (twelve) hours as needed for anxiety. (Patient taking differently: Take 5 mg by mouth in the morning.), Disp: 60 tablet, Rfl: 5   hydrocortisone 2.5 % lotion, Apply 1  application topically daily as needed (when washing face)., Disp: , Rfl:    lidocaine-prilocaine (EMLA) cream, Apply to affected area once, Disp: 30 g, Rfl: 3   losartan (COZAAR) 100 MG tablet, Take 1 tablet (100 mg total) by mouth daily., Disp: 90 tablet, Rfl: 4   Menthol, Topical Analgesic, (BIOFREEZE EX), Apply 1 application topically daily as needed (back pain)., Disp: , Rfl:    mometasone (ELOCON) 0.1 % cream, Apply 1 application topically daily as needed (ear irritation)., Disp: , Rfl:    ondansetron (ZOFRAN) 8 MG tablet, Take 1 tablet (8 mg total) by mouth 2 (two) times daily as needed (Nausea or vomiting)., Disp: 30 tablet, Rfl: 1   Polyethyl Glycol-Propyl Glycol (SYSTANE OP), Place 1 drop into both eyes daily as needed (dry eyes)., Disp: , Rfl:    prochlorperazine (COMPAZINE) 10 MG tablet, Take 1 tablet (10 mg total) by mouth every 6 (six) hours as needed (Nausea or vomiting)., Disp: 30 tablet, Rfl: 1   rosuvastatin (CRESTOR) 5 MG tablet, Take 1 tablet (5 mg total) by mouth daily. (Patient taking differently: Take 5 mg by mouth at bedtime.), Disp: 90 tablet, Rfl: 3   triamterene-hydrochlorothiazide (DYAZIDE) 37.5-25 MG capsule, Take 1 each (1 capsule total) by mouth daily., Disp: 90 capsule, Rfl: 4   venlafaxine XR (EFFEXOR XR) 75 MG 24 hr capsule, Take 3 capsules (225 mg total) by mouth daily with breakfast. (Patient taking differently: Take 150 mg by mouth at bedtime.), Disp: 270 capsule, Rfl: 1  Physical exam:  Vitals:   09/10/20 0850  BP: (!) 106/47  Pulse: 80  Resp: 18  Temp: 97.9 F (36.6 C)  Weight: 235 lb (106.6 kg)  Height: _0  (1.6 m)   Physical Exam HENT:     Head: Normocephalic and atraumatic.  Eyes:     Pupils: Pupils are equal, round, and reactive to light.  Cardiovascular:     Rate and Rhythm: Normal rate and regular rhythm.     Heart sounds: Normal heart sounds.  Pulmonary:     Effort: Pulmonary effort is normal.     Breath sounds: Normal breath sounds.   Abdominal:  General: Bowel sounds are normal.     Palpations: Abdomen is soft.  Musculoskeletal:     Cervical back: Normal range of motion.  Skin:    General: Skin is warm and dry.  Neurological:     Mental Status: She is alert and oriented to person, place, and time.     CMP Latest Ref Rng & Units 06/20/2020  Glucose 70 - 99 mg/dL -  BUN 8 - 23 mg/dL -  Creatinine 0.44 - 1.00 mg/dL -  Sodium 135 - 145 mmol/L -  Potassium 3.5 - 5.2 mmol/L 4.2  Chloride 98 - 111 mmol/L -  CO2 22 - 32 mmol/L -  Calcium 8.9 - 10.3 mg/dL -  Total Protein 6.5 - 8.1 g/dL -  Total Bilirubin 0.3 - 1.2 mg/dL -  Alkaline Phos 38 - 126 U/L -  AST 15 - 41 U/L -  ALT 0 - 44 U/L -   CBC Latest Ref Rng & Units 02/23/2020  WBC 3.4 - 10.8 x10E3/uL 8.9  Hemoglobin 11.1 - 15.9 g/dL 11.9  Hematocrit 34.0 - 46.6 % 37.5  Platelets 150 - 450 x10E3/uL 274    No images are attached to the encounter.  DG Chest Port 1 View  Result Date: 09/05/2020 CLINICAL DATA:  Postoperative port placement. EXAM: PORTABLE CHEST 1 VIEW COMPARISON:  06/16/2008 x-ray, 03/27/2020 CT FINDINGS: Interval placement of right internal jugular approach Port-A-Cath with distal tip terminating at the level of the superior cavoatrial junction. Heart size is normal. No focal airspace consolidation, pleural effusion, or pneumothorax. IMPRESSION: Interval placement of right internal jugular approach Port-A-Cath with distal tip at the level of the superior cavoatrial junction. No pneumothorax. Electronically Signed   By: Davina Poke D.O.   On: 09/05/2020 14:04   DG C-Arm 1-60 Min-No Report  Result Date: 09/05/2020 Fluoroscopy was utilized by the requesting physician.  No radiographic interpretation.     Assessment and plan- Patient is a 70 y.o. 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  She is here for on treatment assessment prior to cycle 1 of  adjuvant Taxol chemotherapy  Given patient's cardiovascular comorbidities and not anthracycline regimen was preferred.  After reading through the risks of TC chemotherapy patient did not wish to proceed with that and has opted for weekly Taxol chemotherapy alone.  Counts are otherwise okay to proceed with cycle 1 of weekly Taxol chemotherapy today.  I will see her back in 1 week's time for cycle 2 of Taxol.  Plan is to give weekly Taxol x12.  Discussed risks and benefits of Taxol chemotherapy again including all but not limited to nausea, vomiting, low blood counts, risk of infections and hospitalization.  Risk of peripheral neuropathy and infusion reaction associated with Taxol.  Treatment is being given with a curative intent.  Patient understands and agrees to proceed as planned.  I am giving a reduced dose of Taxol at 65 mg per metered square.  Upon completion of 12 weekly cycles of Taxol chemotherapy patient will go on to receive adjuvant radiation treatment.  She will start hormone therapy upon completion of radiation treatment and I will consider looking into adjuvant abemaciclib as well at that time.   Visit Diagnosis 1. Malignant neoplasm of upper-outer quadrant of left breast in female, estrogen receptor positive ([place])   2. Encounter for antineoplastic chemotherapy      Dr. Randa Evens, MD, MPH [place] [facility]  [facility] [phone] 09/10/2020 7:50 AM

## 2020-09-10 NOTE — Progress Notes (Signed)
Pt says with mask it is hard to breathe but she sometimes opens the mask to the side to breathe batter. She is nervous but she states that she will get better after she goes through 2-3 cycles. Back pain is every day issue and rating 4 today

## 2020-09-10 NOTE — Patient Instructions (Signed)
[facility] [place] [facility]  Discharge Instructions: Thank you for choosing [facility] to provide your oncology and hematology care.  If you have a lab appointment with the Cancer Center, please go directly to the Cancer Center and check in at the registration area.  Wear comfortable clothing and clothing appropriate for easy access to any Portacath or PICC line.   We strive to give you quality time with your provider. You may need to reschedule your appointment if you arrive late (15 or more minutes).  Arriving late affects you and other patients whose appointments are after yours.  Also, if you miss three or more appointments without notifying the office, you may be dismissed from the clinic at the provider's discretion.      For prescription refill requests, have your pharmacy contact our office and allow 72 hours for refills to be completed.      To help prevent nausea and vomiting after your treatment, we encourage you to take your nausea medication as directed.  BELOW ARE SYMPTOMS THAT SHOULD BE REPORTED IMMEDIATELY: *FEVER GREATER THAN 100.4 F (38 C) OR HIGHER *CHILLS OR SWEATING *NAUSEA AND VOMITING THAT IS NOT CONTROLLED WITH YOUR NAUSEA MEDICATION *UNUSUAL SHORTNESS OF BREATH *UNUSUAL BRUISING OR BLEEDING *URINARY PROBLEMS (pain or burning when urinating, or frequent urination) *BOWEL PROBLEMS (unusual diarrhea, constipation, pain near the anus) TENDERNESS IN MOUTH AND THROAT WITH OR WITHOUT PRESENCE OF ULCERS (sore throat, sores in mouth, or a toothache) UNUSUAL RASH, SWELLING OR PAIN  UNUSUAL VAGINAL DISCHARGE OR ITCHING   Items with * indicate a potential emergency and should be followed up as soon as possible or go to the Emergency Department if any problems should occur.  Please show the CHEMOTHERAPY ALERT CARD or IMMUNOTHERAPY ALERT CARD at check-in to the Emergency Department and triage nurse.  Should you have questions after your  visit or need to cancel or reschedule your appointment, please contact [facility] [place] [facility]  [phone] and follow the prompts.  Office hours are 8:00 a.m. to 4:30 p.m. Monday - Friday. Please note that voicemails left after 4:00 p.m. may not be returned until the following business day.  We are closed weekends and major holidays. You have access to a nurse at all times for urgent questions. Please call the main number to the clinic [phone] and follow the prompts.  For any non-urgent questions, you may also contact your provider using MyChart. We now offer e-Visits for anyone 18 and older to request care online for non-urgent symptoms. For details visit [email or web address].   Also download the MyChart app! Go to the app store, search "MyChart", open the app, select [place], and log in with your MyChart username and password.  Due to Covid, a mask is required upon entering the hospital/clinic. If you do not have a mask, one will be given to you upon arrival. For doctor visits, patients may have 1 support person aged 18 or older with them. For treatment visits, patients cannot have anyone with them due to current Covid guidelines and our immunocompromised population.  

## 2020-09-11 ENCOUNTER — Telehealth: Payer: Self-pay

## 2020-09-11 LAB — HEPATITIS B SURFACE ANTIBODY, QUANTITATIVE: Hep B S AB Quant (Post): 237.6 m[IU]/mL (ref >9.9)

## 2020-09-11 NOTE — Telephone Encounter (Signed)
Telephone call to patient for follow up after receiving first infusion.   No answer but left message stating we were calling to check on them.  Encouraged patient to call for any questions or concerns.   

## 2020-09-13 ENCOUNTER — Other Ambulatory Visit: Payer: Self-pay | Admitting: Family Medicine

## 2020-09-13 DIAGNOSIS — I2510 Atherosclerotic heart disease of native coronary artery without angina pectoris: Secondary | ICD-10-CM

## 2020-09-13 DIAGNOSIS — I10 Essential (primary) hypertension: Secondary | ICD-10-CM

## 2020-09-17 ENCOUNTER — Inpatient Hospital Stay: Payer: Medicare PPO

## 2020-09-17 ENCOUNTER — Encounter: Payer: Self-pay | Admitting: Oncology

## 2020-09-17 ENCOUNTER — Inpatient Hospital Stay: Payer: Medicare PPO | Admitting: Oncology

## 2020-09-17 VITALS — BP 95/63 | HR 81 | Temp 97.9°F | Resp 20 | Wt 233.8 lb

## 2020-09-17 DIAGNOSIS — Z170 Estrogen receptor positive status [ER+]: Secondary | ICD-10-CM | POA: Diagnosis not present

## 2020-09-17 DIAGNOSIS — Z79899 Other long term (current) drug therapy: Secondary | ICD-10-CM | POA: Diagnosis not present

## 2020-09-17 DIAGNOSIS — C50412 Malignant neoplasm of upper-outer quadrant of left female breast: Secondary | ICD-10-CM

## 2020-09-17 DIAGNOSIS — R0602 Shortness of breath: Secondary | ICD-10-CM | POA: Diagnosis not present

## 2020-09-17 DIAGNOSIS — Z5111 Encounter for antineoplastic chemotherapy: Secondary | ICD-10-CM | POA: Diagnosis not present

## 2020-09-17 LAB — CBC WITH DIFFERENTIAL/PLATELET
Abs Immature Granulocytes: 0.01 10*3/uL (ref 0.00–0.07)
Basophils Absolute: 0.1 10*3/uL (ref 0.0–0.1)
Basophils Relative: 1 %
Eosinophils Absolute: 0.1 10*3/uL (ref 0.0–0.5)
Eosinophils Relative: 2 %
HCT: 33 % — ABNORMAL LOW (ref 36.0–46.0)
Hemoglobin: 10.6 g/dL — ABNORMAL LOW (ref 12.0–15.0)
Immature Granulocytes: 0 %
Lymphocytes Relative: 39 %
Lymphs Abs: 2.2 10*3/uL (ref 0.7–4.0)
MCH: 29.9 pg (ref 26.0–34.0)
MCHC: 32.1 g/dL (ref 30.0–36.0)
MCV: 93 fL (ref 80.0–100.0)
Monocytes Absolute: 0.4 10*3/uL (ref 0.1–1.0)
Monocytes Relative: 8 %
Neutro Abs: 2.8 10*3/uL (ref 1.7–7.7)
Neutrophils Relative %: 50 %
Platelets: 296 10*3/uL (ref 150–400)
RBC: 3.55 MIL/uL — ABNORMAL LOW (ref 3.87–5.11)
RDW: 14.3 % (ref 11.5–15.5)
WBC: 5.6 10*3/uL (ref 4.0–10.5)
nRBC: 0 % (ref 0.0–0.2)

## 2020-09-17 LAB — COMPREHENSIVE METABOLIC PANEL
ALT: 24 U/L (ref 0–44)
AST: 22 U/L (ref 15–41)
Albumin: 3.7 g/dL (ref 3.5–5.0)
Alkaline Phosphatase: 107 U/L (ref 38–126)
Anion gap: 7 (ref 5–15)
BUN: 27 mg/dL — ABNORMAL HIGH (ref 8–23)
CO2: 29 mmol/L (ref 22–32)
Calcium: 8.9 mg/dL (ref 8.9–10.3)
Chloride: 102 mmol/L (ref 98–111)
Creatinine, Ser: 0.95 mg/dL (ref 0.44–1.00)
GFR, Estimated: >60 mL/min (ref >60)
Glucose, Bld: 102 mg/dL — ABNORMAL HIGH (ref 70–99)
Potassium: 3.5 mmol/L (ref 3.5–5.1)
Sodium: 138 mmol/L (ref 135–145)
Total Bilirubin: 0.3 mg/dL (ref 0.3–1.2)
Total Protein: 6.8 g/dL (ref 6.5–8.1)

## 2020-09-17 MED ORDER — DIPHENHYDRAMINE HCL 50 MG/ML IJ SOLN
50.0000 mg | Freq: Once | INTRAMUSCULAR | Status: AC
  Administered 2020-09-17: 50 mg via INTRAVENOUS
  Filled 2020-09-17: qty 1

## 2020-09-17 MED ORDER — SODIUM CHLORIDE 0.9 % IV SOLN
Freq: Once | INTRAVENOUS | Status: AC
  Filled 2020-09-17: qty 250

## 2020-09-17 MED ORDER — SODIUM CHLORIDE 0.9 % IV SOLN
65.0000 mg/m2 | Freq: Once | INTRAVENOUS | Status: AC
  Administered 2020-09-17: 144 mg via INTRAVENOUS
  Filled 2020-09-17: qty 24

## 2020-09-17 MED ORDER — HEPARIN SOD (PORK) LOCK FLUSH 100 UNIT/ML IV SOLN
500.0000 [IU] | Freq: Once | INTRAVENOUS | Status: AC | PRN
  Filled 2020-09-17: qty 5

## 2020-09-17 MED ORDER — HEPARIN SOD (PORK) LOCK FLUSH 100 UNIT/ML IV SOLN
INTRAVENOUS | Status: AC
  Administered 2020-09-17: 500 [IU]
  Filled 2020-09-17: qty 5

## 2020-09-17 MED ORDER — FAMOTIDINE 20 MG IN NS 100 ML IVPB
20.0000 mg | Freq: Once | INTRAVENOUS | Status: AC
  Administered 2020-09-17: 20 mg via INTRAVENOUS
  Filled 2020-09-17: qty 20

## 2020-09-17 MED ORDER — SODIUM CHLORIDE 0.9 % IV SOLN
10.0000 mg | Freq: Once | INTRAVENOUS | Status: AC
  Administered 2020-09-17: 10 mg via INTRAVENOUS
  Filled 2020-09-17: qty 10

## 2020-09-17 MED ORDER — SODIUM CHLORIDE 0.9% FLUSH
10.0000 mL | Freq: Once | INTRAVENOUS | Status: AC
  Administered 2020-09-17: 10 mL via INTRAVENOUS
  Filled 2020-09-17: qty 10

## 2020-09-17 NOTE — Progress Notes (Signed)
Hematology/Oncology Consult note [facility]  Telephone:([phone] Fax:([phone]  Patient Care Team: Jerrol Banana., MD as PCP - General (Family Medicine) Lorelee Cover., MD as Consulting Physician (Ophthalmology) Dimmig, Marcello Moores, MD as Referring Physician (Orthopedic Surgery) Oneta Rack, MD (Dermatology) Dasher, Rayvon Char, MD (Dermatology)   Name of the patient: Victoria Dean  [id number]  Sep 02, 1950   Date of visit: 09/17/20  Diagnosis- pathological prognostic stage Ib invasive lobular carcinoma of the left breast [id number] ER/PR positive HER2 negative    Chief complaint/ Reason for visit-on treatment assessment prior to cycle 2 of adjuvant Taxol chemotherapy  Heme/Onc history: Patient is a 70 year old female with a past medical history significant for claustrophobia, hypertension hypercholesterolemia, depression among other medical problems.  Patient began to notice retraction of her left nipple.  This was followed by a diagnostic mammogram which showed vague shadowing in the upper outer quadrant of the left breast without a definitive mass.  Benign cysts in the lower left breast measuring 1.5 cm.  No evidence of malignancy in the right breast.  No evidence of abnormal left axillary lymph nodes.  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   Bilateral breast MRI showed 7 x 6 x 4 cm area of non-mass enhancement in the upper outer left breast at 1 o'clock position.  This involves anterior and middle third of the breast and extension into the posterior third.  2 inferior left axillary lymph nodes with mild cortical thickening.   PET CT scan showed 1.7 cm hypermetabolic nodule in the midline of the neck which could represent thyroglossal cyst but malignancy cannot  be excluded. Patient underwent left-sided lumpectomy with nipple excision.     Final pathology showed invasive lobular carcinoma grade 210.5 cm with associated DCIS.  Skin/nipple are present and involved.  Invasive carcinoma invades the nipple without skin ulceration.  Lymphovascular invasion present.  Margins negative.  3 out of 4 lymph nodes positive for malignancy 11 mm largest deposit with extranodal extension.  pT3 pN1 a cM0   Patient was taken back for axillary dissection given 3 lymph nodes positive with extranodal extension and 4 more lymph nodes out of 9 were positive with extranodal extension   Given cardiovascular comorbidities and patient age none anthracycline adjuvant chemotherapy options were discussed.  Patient did not wish to proceed with TC chemotherapy and has decided with weekly Taxol chemotherapy    Interval history-patient tolerated cycle 1 of chemotherapy well.  She had some mild fatigue which was overall self-limited.  She did report some heartburn after eating a large meal which has since then subsided.  ECOG PS- 1 Pain scale- 0   Review of systems- Review of Systems  Constitutional:  Negative for chills, fever, malaise/fatigue and weight loss.  HENT:  Negative for congestion, ear discharge and nosebleeds.   Eyes:  Negative for blurred vision.  Respiratory:  Negative for cough, hemoptysis, sputum production, shortness of breath and wheezing.   Cardiovascular:  Negative for chest pain, palpitations, orthopnea and claudication.  Gastrointestinal:  Positive for heartburn. Negative for abdominal pain, blood in stool, constipation, diarrhea, melena, nausea and vomiting.  Genitourinary:  Negative for dysuria, flank pain, frequency, hematuria and urgency.  Musculoskeletal:  Negative for back pain, joint pain and myalgias.  Skin:  Negative for rash.  Neurological:  Negative for dizziness, tingling, focal weakness, seizures, weakness and headaches.  Endo/Heme/Allergies:   Does not bruise/bleed easily.  Psychiatric/Behavioral:  Negative for depression and suicidal ideas. The patient does not have insomnia.      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    Vitamin D deficiency      Past Surgical History:  Procedure Laterality Date   ABDOMINAL HYSTERECTOMY  1995   APPENDECTOMY  1994   BREAST BIOPSY Right 1998   neg   BREAST BIOPSY Left 05/30/2020   stere bx x clip path pending   BREAST LUMPECTOMY WITH SENTINEL LYMPH NODE BIOPSY Left 06/29/2020   Procedure: BREAST LUMPECTOMY WITH SENTINEL LYMPH NODE BX;  Surgeon: Robert Bellow, MD;  Location: [facility] ORS;  Service: General;  Laterality: Left;   ESI     LUMBAR MICRODISCECTOMY  2019   L3-4   LUMBAR MICRODISCECTOMY  2008   L4-L5 repeated   MICRODISCECTOMY LUMBAR  2008   L4-5   MICRODISCECTOMY LUMBAR  2010   L4-L5 repeated   NODE DISSECTION Left 08/13/2020   Procedure: AXILLARY NODE DISSECTION;  Surgeon: Robert Bellow, MD;  Location: [facility] ORS;  Service: General;  Laterality: Left;   PORTACATH PLACEMENT Right 09/05/2020   Procedure: INSERTION PORT-A-CATH;  Surgeon: Robert Bellow, MD;  Location: [facility] ORS;  Service: General;  Laterality: Right;  right; monitor anesthesia care   SQUAMOUS CELL CARCINOMA EXCISION     TONSILLECTOMY  1971    Social History   Socioeconomic History    Marital status: Widowed    Spouse name: Not on file   Number of children: 1   Years of education: Not on file   Highest education level: Bachelor's degree (e.g., BA, AB, BS)  Occupational History   Occupation: retired  Tobacco Use   Smoking status: Former    Packs/day: 0.91    Years: 33.00    Pack years: 30.03    Types: Cigarettes    Quit date: 04/19/2018    Years since quitting: 2.4   Smokeless tobacco: Never  Vaping Use   Vaping Use: Never used  Substance and Sexual Activity   Alcohol use: Yes    Comment: rarely   Drug use: No   Sexual activity: Not Currently  Other Topics Concern   Not on file  Social History Narrative   Not on file   Social Determinants of Health   Financial Resource Strain: Not on file  Food Insecurity: Not on file  Transportation Needs: Not on file  Physical Activity: Not on file  Stress: Not on file  Social Connections: Not on file  Intimate Partner Violence: Not on file  Family History  Problem Relation Age of Onset   Breast cancer Mother 55   Osteosarcoma Mother        d. 22   Hypertension Brother    COPD Father    Hypertension Father    Breast cancer Paternal Aunt        dx 46s   Breast cancer Paternal Grandmother        dx 42s   Stomach cancer Paternal Grandfather        dx 61s   Breast cancer Paternal Aunt        dx 72s   Brain cancer Paternal Aunt        dx 59s     Current Outpatient Medications:    acetaminophen (TYLENOL) 650 MG CR tablet, Take 1,300 mg by mouth every 8 (eight) hours as needed for pain., Disp: , Rfl:    Biotin 1000 MCG tablet, Take 1,000 mcg by mouth daily., Disp: , Rfl:    cholecalciferol (VITAMIN D) 25 MCG (1000 UNIT) tablet, Take 1,000 Units by mouth daily., Disp: , Rfl:    diazepam (VALIUM) 5 MG tablet, Take 1 tablet (5 mg total) by mouth every 12 (twelve) hours as needed for anxiety. (Patient taking differently: Take 5 mg by mouth in the morning.), Disp: 60 tablet, Rfl: 5   hydrocortisone 2.5 %  lotion, Apply 1 application topically daily as needed (when washing face)., Disp: , Rfl:    lidocaine-prilocaine (EMLA) cream, Apply to affected area once, Disp: 30 g, Rfl: 3   losartan (COZAAR) 100 MG tablet, TAKE 1 TABLET (100 MG TOTAL) BY MOUTH DAILY., Disp: 90 tablet, Rfl: 1   Menthol, Topical Analgesic, (BIOFREEZE EX), Apply 1 application topically daily as needed (back pain)., Disp: , Rfl:    mometasone (ELOCON) 0.1 % cream, Apply 1 application topically daily as needed (ear irritation)., Disp: , Rfl:    Polyethyl Glycol-Propyl Glycol (SYSTANE OP), Place 1 drop into both eyes daily as needed (dry eyes)., Disp: , Rfl:    rosuvastatin (CRESTOR) 5 MG tablet, TAKE 1 TABLET (5 MG TOTAL) BY MOUTH DAILY., Disp: 90 tablet, Rfl: 1   triamterene-hydrochlorothiazide (DYAZIDE) 37.5-25 MG capsule, Take 1 each (1 capsule total) by mouth daily., Disp: 90 capsule, Rfl: 4   ondansetron (ZOFRAN) 8 MG tablet, Take 1 tablet (8 mg total) by mouth 2 (two) times daily as needed (Nausea or vomiting). (Patient not taking: No sig reported), Disp: 30 tablet, Rfl: 1   prochlorperazine (COMPAZINE) 10 MG tablet, Take 1 tablet (10 mg total) by mouth every 6 (six) hours as needed (Nausea or vomiting). (Patient not taking: No sig reported), Disp: 30 tablet, Rfl: 1   venlafaxine XR (EFFEXOR XR) 75 MG 24 hr capsule, Take 3 capsules (225 mg total) by mouth daily with breakfast. (Patient not taking: Reported on 09/17/2020), Disp: 270 capsule, Rfl: 1  Physical exam:  Vitals:   09/17/20 0900  BP: 95/63  Pulse: 81  Resp: 20  Temp: 97.9 F (36.6 C)  TempSrc: Tympanic  SpO2: 96%  Weight: 233 lb 12.8 oz (106.1 kg)   Physical Exam Constitutional:      General: She is not in acute distress.    Comments: Ambulates with a cane.  Appears in no acute distress  Cardiovascular:     Rate and Rhythm: Normal rate and regular rhythm.     Heart sounds: Normal heart sounds.  Pulmonary:     Effort: Pulmonary effort is normal.      Breath sounds: Normal  breath sounds.  Abdominal:     General: Bowel sounds are normal.     Palpations: Abdomen is soft.  Skin:    General: Skin is warm and dry.  Neurological:     Mental Status: She is alert and oriented to person, place, and time.     CMP Latest Ref Rng & Units 09/17/2020  Glucose 70 - 99 mg/dL 102(H)  BUN 8 - 23 mg/dL 27(H)  Creatinine 0.44 - 1.00 mg/dL 0.95  Sodium 135 - 145 mmol/L 138  Potassium 3.5 - 5.1 mmol/L 3.5  Chloride 98 - 111 mmol/L 102  CO2 22 - 32 mmol/L 29  Calcium 8.9 - 10.3 mg/dL 8.9  Total Protein 6.5 - 8.1 g/dL 6.8  Total Bilirubin 0.3 - 1.2 mg/dL 0.3  Alkaline Phos 38 - 126 U/L 107  AST 15 - 41 U/L 22  ALT 0 - 44 U/L 24   CBC Latest Ref Rng & Units 09/17/2020  WBC 4.0 - 10.5 K/uL 5.6  Hemoglobin 12.0 - 15.0 g/dL 10.6(L)  Hematocrit 36.0 - 46.0 % 33.0(L)  Platelets 150 - 400 K/uL 296    No images are attached to the encounter.  DG Chest Port 1 View  Result Date: 09/05/2020 CLINICAL DATA:  Postoperative port placement. EXAM: PORTABLE CHEST 1 VIEW COMPARISON:  06/16/2008 x-ray, 03/27/2020 CT FINDINGS: Interval placement of right internal jugular approach Port-A-Cath with distal tip terminating at the level of the superior cavoatrial junction. Heart size is normal. No focal airspace consolidation, pleural effusion, or pneumothorax. IMPRESSION: Interval placement of right internal jugular approach Port-A-Cath with distal tip at the level of the superior cavoatrial junction. No pneumothorax. Electronically Signed   By: Davina Poke D.O.   On: 09/05/2020 14:04   DG C-Arm 1-60 Min-No Report  Result Date: 09/05/2020 Fluoroscopy was utilized by the requesting physician.  No radiographic interpretation.     Assessment and plan- Patient is a 70 y.o. 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  She is here for on treatment assessment prior to cycle 2 of  adjuvant Taxol chemotherapy  Counts okay to proceed with cycle 2 of adjuvant Taxol chemotherapy today.  She will directly proceed for cycle 3 next week and I will see her back in 2 weeks for cycle 4.  Plan is to complete 12 adjuvant cycles weekly followed by adjuvant radiation treatment.  Heartburn: I have asked her to try over-the-counter Tums or Pepcid.  We will hold off on starting on a PPI at this time   Visit Diagnosis 1. Encounter for antineoplastic chemotherapy   2. Malignant neoplasm of upper-outer quadrant of left breast in female, estrogen receptor positive ([place])      Dr. Randa Evens, MD, MPH [facility] [phone] 09/17/2020 3:42 PM

## 2020-09-24 ENCOUNTER — Inpatient Hospital Stay: Payer: Medicare PPO

## 2020-09-24 ENCOUNTER — Other Ambulatory Visit: Payer: Self-pay | Admitting: *Deleted

## 2020-09-24 VITALS — BP 143/62 | HR 93 | Temp 97.8°F | Resp 20 | Wt 233.5 lb

## 2020-09-24 DIAGNOSIS — Z79899 Other long term (current) drug therapy: Secondary | ICD-10-CM | POA: Diagnosis not present

## 2020-09-24 DIAGNOSIS — C50412 Malignant neoplasm of upper-outer quadrant of left female breast: Secondary | ICD-10-CM

## 2020-09-24 DIAGNOSIS — R5383 Other fatigue: Secondary | ICD-10-CM

## 2020-09-24 DIAGNOSIS — Z95828 Presence of other vascular implants and grafts: Secondary | ICD-10-CM

## 2020-09-24 DIAGNOSIS — R0602 Shortness of breath: Secondary | ICD-10-CM | POA: Diagnosis not present

## 2020-09-24 DIAGNOSIS — Z170 Estrogen receptor positive status [ER+]: Secondary | ICD-10-CM

## 2020-09-24 DIAGNOSIS — Z5111 Encounter for antineoplastic chemotherapy: Secondary | ICD-10-CM | POA: Diagnosis not present

## 2020-09-24 LAB — COMPREHENSIVE METABOLIC PANEL
ALT: 25 U/L (ref 0–44)
AST: 23 U/L (ref 15–41)
Albumin: 3.7 g/dL (ref 3.5–5.0)
Alkaline Phosphatase: 116 U/L (ref 38–126)
Anion gap: 8 (ref 5–15)
BUN: 24 mg/dL — ABNORMAL HIGH (ref 8–23)
CO2: 28 mmol/L (ref 22–32)
Calcium: 8.7 mg/dL — ABNORMAL LOW (ref 8.9–10.3)
Chloride: 101 mmol/L (ref 98–111)
Creatinine, Ser: 1 mg/dL (ref 0.44–1.00)
GFR, Estimated: >60 mL/min (ref >60)
Glucose, Bld: 98 mg/dL (ref 70–99)
Potassium: 3.6 mmol/L (ref 3.5–5.1)
Sodium: 137 mmol/L (ref 135–145)
Total Bilirubin: 0.6 mg/dL (ref 0.3–1.2)
Total Protein: 7 g/dL (ref 6.5–8.1)

## 2020-09-24 LAB — CBC WITH DIFFERENTIAL/PLATELET
Abs Immature Granulocytes: 0.01 10*3/uL (ref 0.00–0.07)
Basophils Absolute: 0 10*3/uL (ref 0.0–0.1)
Basophils Relative: 1 %
Eosinophils Absolute: 0.1 10*3/uL (ref 0.0–0.5)
Eosinophils Relative: 2 %
HCT: 32.9 % — ABNORMAL LOW (ref 36.0–46.0)
Hemoglobin: 10.7 g/dL — ABNORMAL LOW (ref 12.0–15.0)
Immature Granulocytes: 0 %
Lymphocytes Relative: 41 %
Lymphs Abs: 2.1 10*3/uL (ref 0.7–4.0)
MCH: 30.1 pg (ref 26.0–34.0)
MCHC: 32.5 g/dL (ref 30.0–36.0)
MCV: 92.4 fL (ref 80.0–100.0)
Monocytes Absolute: 0.3 10*3/uL (ref 0.1–1.0)
Monocytes Relative: 7 %
Neutro Abs: 2.5 10*3/uL (ref 1.7–7.7)
Neutrophils Relative %: 49 %
Platelets: 298 10*3/uL (ref 150–400)
RBC: 3.56 MIL/uL — ABNORMAL LOW (ref 3.87–5.11)
RDW: 14.8 % (ref 11.5–15.5)
WBC: 5 10*3/uL (ref 4.0–10.5)
nRBC: 0 % (ref 0.0–0.2)

## 2020-09-24 MED ORDER — HEPARIN SOD (PORK) LOCK FLUSH 100 UNIT/ML IV SOLN
500.0000 [IU] | Freq: Once | INTRAVENOUS | Status: DC | PRN
  Filled 2020-09-24: qty 5

## 2020-09-24 MED ORDER — FAMOTIDINE 20 MG IN NS 100 ML IVPB
20.0000 mg | Freq: Once | INTRAVENOUS | Status: AC
  Administered 2020-09-24: 20 mg via INTRAVENOUS
  Filled 2020-09-24: qty 20

## 2020-09-24 MED ORDER — HEPARIN SOD (PORK) LOCK FLUSH 100 UNIT/ML IV SOLN
INTRAVENOUS | Status: AC
  Administered 2020-09-24: 500 [IU] via INTRAVENOUS
  Filled 2020-09-24: qty 5

## 2020-09-24 MED ORDER — DIPHENHYDRAMINE HCL 50 MG/ML IJ SOLN
50.0000 mg | Freq: Once | INTRAMUSCULAR | Status: AC
  Administered 2020-09-24: 50 mg via INTRAVENOUS
  Filled 2020-09-24: qty 1

## 2020-09-24 MED ORDER — HEPARIN SOD (PORK) LOCK FLUSH 100 UNIT/ML IV SOLN
500.0000 [IU] | Freq: Once | INTRAVENOUS | Status: AC
  Filled 2020-09-24: qty 5

## 2020-09-24 MED ORDER — SODIUM CHLORIDE 0.9% FLUSH
10.0000 mL | Freq: Once | INTRAVENOUS | Status: AC
  Administered 2020-09-24: 10 mL via INTRAVENOUS
  Filled 2020-09-24: qty 10

## 2020-09-24 MED ORDER — SODIUM CHLORIDE 0.9 % IV SOLN
10.0000 mg | Freq: Once | INTRAVENOUS | Status: AC
  Administered 2020-09-24: 10 mg via INTRAVENOUS
  Filled 2020-09-24: qty 10

## 2020-09-24 MED ORDER — SODIUM CHLORIDE 0.9 % IV SOLN
65.0000 mg/m2 | Freq: Once | INTRAVENOUS | Status: AC
  Administered 2020-09-24: 144 mg via INTRAVENOUS
  Filled 2020-09-24: qty 24

## 2020-09-24 MED ORDER — SODIUM CHLORIDE 0.9 % IV SOLN
Freq: Once | INTRAVENOUS | Status: AC
  Filled 2020-09-24: qty 250

## 2020-09-24 NOTE — Progress Notes (Signed)
Add on labd for next tx.

## 2020-09-24 NOTE — Patient Instructions (Signed)
[facility] [place] [facility]  Discharge Instructions: Thank you for choosing [facility] to provide your oncology and hematology care.  If you have a lab appointment with the Cancer Center, please go directly to the Cancer Center and check in at the registration area.  Wear comfortable clothing and clothing appropriate for easy access to any Portacath or PICC line.   We strive to give you quality time with your provider. You may need to reschedule your appointment if you arrive late (15 or more minutes).  Arriving late affects you and other patients whose appointments are after yours.  Also, if you miss three or more appointments without notifying the office, you may be dismissed from the clinic at the provider's discretion.      For prescription refill requests, have your pharmacy contact our office and allow 72 hours for refills to be completed.    Today you received the following chemotherapy and/or immunotherapy agents Taxol        To help prevent nausea and vomiting after your treatment, we encourage you to take your nausea medication as directed.  BELOW ARE SYMPTOMS THAT SHOULD BE REPORTED IMMEDIATELY: *FEVER GREATER THAN 100.4 F (38 C) OR HIGHER *CHILLS OR SWEATING *NAUSEA AND VOMITING THAT IS NOT CONTROLLED WITH YOUR NAUSEA MEDICATION *UNUSUAL SHORTNESS OF BREATH *UNUSUAL BRUISING OR BLEEDING *URINARY PROBLEMS (pain or burning when urinating, or frequent urination) *BOWEL PROBLEMS (unusual diarrhea, constipation, pain near the anus) TENDERNESS IN MOUTH AND THROAT WITH OR WITHOUT PRESENCE OF ULCERS (sore throat, sores in mouth, or a toothache) UNUSUAL RASH, SWELLING OR PAIN  UNUSUAL VAGINAL DISCHARGE OR ITCHING   Items with * indicate a potential emergency and should be followed up as soon as possible or go to the Emergency Department if any problems should occur.  Please show the CHEMOTHERAPY ALERT CARD or IMMUNOTHERAPY ALERT CARD at check-in to  the Emergency Department and triage nurse.  Should you have questions after your visit or need to cancel or reschedule your appointment, please contact [facility] [place] [facility]  [phone] and follow the prompts.  Office hours are 8:00 a.m. to 4:30 p.m. Monday - Friday. Please note that voicemails left after 4:00 p.m. may not be returned until the following business day.  We are closed weekends and major holidays. You have access to a nurse at all times for urgent questions. Please call the main number to the clinic [phone] and follow the prompts.  For any non-urgent questions, you may also contact your provider using MyChart. We now offer e-Visits for anyone 18 and older to request care online for non-urgent symptoms. For details visit [email or web address].   Also download the MyChart app! Go to the app store, search "MyChart", open the app, select [place], and log in with your MyChart username and password.  Due to Covid, a mask is required upon entering the hospital/clinic. If you do not have a mask, one will be given to you upon arrival. For doctor visits, patients may have 1 support person aged 18 or older with them. For treatment visits, patients cannot have anyone with them due to current Covid guidelines and our immunocompromised population.  

## 2020-09-24 NOTE — Progress Notes (Signed)
pt reports, increased unsteadiness on her feet, (no numbness or tingling), she reports that it is has become more difficult to write, but no other issues with ADLS such as buttoning buttons noted. Pt reports increased SOB with and without exertion (at rest). Dr. Janese Banks aware. Per Dr. Janese Banks okay to proceed with treatment at this time. Pt educated to seek emergency care in the event of an emergency or to call clinic with any concerns. Pt verbalizes understanding and agrees with plan.  Pt questions if it would be beneficial to begin an oral iron supplement. Per Dr. Janese Banks not at this time. Additional labs will be drawn at next visit. Pt aware.

## 2020-10-01 ENCOUNTER — Ambulatory Visit: Payer: Medicare PPO

## 2020-10-01 ENCOUNTER — Telehealth: Payer: Self-pay | Admitting: *Deleted

## 2020-10-01 ENCOUNTER — Inpatient Hospital Stay: Payer: Medicare PPO

## 2020-10-01 ENCOUNTER — Ambulatory Visit: Payer: Medicare PPO | Admitting: Oncology

## 2020-10-01 ENCOUNTER — Other Ambulatory Visit: Payer: Medicare PPO

## 2020-10-01 ENCOUNTER — Inpatient Hospital Stay: Payer: Medicare PPO | Admitting: Oncology

## 2020-10-01 ENCOUNTER — Encounter: Payer: Self-pay | Admitting: Oncology

## 2020-10-01 VITALS — BP 132/74 | HR 95 | Temp 98.0°F | Resp 18 | Wt 237.0 lb

## 2020-10-01 DIAGNOSIS — R0600 Dyspnea, unspecified: Secondary | ICD-10-CM

## 2020-10-01 DIAGNOSIS — Z5111 Encounter for antineoplastic chemotherapy: Secondary | ICD-10-CM | POA: Diagnosis not present

## 2020-10-01 DIAGNOSIS — R0602 Shortness of breath: Secondary | ICD-10-CM | POA: Diagnosis not present

## 2020-10-01 DIAGNOSIS — C50412 Malignant neoplasm of upper-outer quadrant of left female breast: Secondary | ICD-10-CM

## 2020-10-01 DIAGNOSIS — R5383 Other fatigue: Secondary | ICD-10-CM

## 2020-10-01 DIAGNOSIS — Z170 Estrogen receptor positive status [ER+]: Secondary | ICD-10-CM | POA: Diagnosis not present

## 2020-10-01 DIAGNOSIS — Z79899 Other long term (current) drug therapy: Secondary | ICD-10-CM | POA: Diagnosis not present

## 2020-10-01 LAB — CBC WITH DIFFERENTIAL/PLATELET
Abs Immature Granulocytes: 0.05 10*3/uL (ref 0.00–0.07)
Basophils Absolute: 0.1 10*3/uL (ref 0.0–0.1)
Basophils Relative: 1 %
Eosinophils Absolute: 0.1 10*3/uL (ref 0.0–0.5)
Eosinophils Relative: 1 %
HCT: 32.8 % — ABNORMAL LOW (ref 36.0–46.0)
Hemoglobin: 10.7 g/dL — ABNORMAL LOW (ref 12.0–15.0)
Immature Granulocytes: 1 %
Lymphocytes Relative: 37 %
Lymphs Abs: 2 10*3/uL (ref 0.7–4.0)
MCH: 30 pg (ref 26.0–34.0)
MCHC: 32.6 g/dL (ref 30.0–36.0)
MCV: 91.9 fL (ref 80.0–100.0)
Monocytes Absolute: 0.4 10*3/uL (ref 0.1–1.0)
Monocytes Relative: 8 %
Neutro Abs: 2.9 10*3/uL (ref 1.7–7.7)
Neutrophils Relative %: 52 %
Platelets: 290 10*3/uL (ref 150–400)
RBC: 3.57 MIL/uL — ABNORMAL LOW (ref 3.87–5.11)
RDW: 15 % (ref 11.5–15.5)
WBC: 5.4 10*3/uL (ref 4.0–10.5)
nRBC: 0.4 % — ABNORMAL HIGH (ref 0.0–0.2)

## 2020-10-01 LAB — COMPREHENSIVE METABOLIC PANEL
ALT: 29 U/L (ref 0–44)
AST: 26 U/L (ref 15–41)
Albumin: 3.7 g/dL (ref 3.5–5.0)
Alkaline Phosphatase: 110 U/L (ref 38–126)
Anion gap: 8 (ref 5–15)
BUN: 25 mg/dL — ABNORMAL HIGH (ref 8–23)
CO2: 29 mmol/L (ref 22–32)
Calcium: 8.8 mg/dL — ABNORMAL LOW (ref 8.9–10.3)
Chloride: 102 mmol/L (ref 98–111)
Creatinine, Ser: 0.87 mg/dL (ref 0.44–1.00)
GFR, Estimated: >60 mL/min (ref >60)
Glucose, Bld: 123 mg/dL — ABNORMAL HIGH (ref 70–99)
Potassium: 3.4 mmol/L — ABNORMAL LOW (ref 3.5–5.1)
Sodium: 139 mmol/L (ref 135–145)
Total Bilirubin: 0.5 mg/dL (ref 0.3–1.2)
Total Protein: 6.8 g/dL (ref 6.5–8.1)

## 2020-10-01 LAB — FERRITIN: Ferritin: 50 ng/mL (ref 11–307)

## 2020-10-01 LAB — IRON AND TIBC
Iron: 58 ug/dL (ref 28–170)
Saturation Ratios: 16 % (ref 10.4–31.8)
TIBC: 363 ug/dL (ref 250–450)
UIBC: 305 ug/dL

## 2020-10-01 LAB — VITAMIN B12: Vitamin B-12: 148 pg/mL — ABNORMAL LOW (ref 180–914)

## 2020-10-01 MED ORDER — SODIUM CHLORIDE 0.9% FLUSH
10.0000 mL | INTRAVENOUS | Status: DC | PRN
  Administered 2020-10-01: 10 mL via INTRAVENOUS
  Filled 2020-10-01: qty 10

## 2020-10-01 MED ORDER — DIPHENHYDRAMINE HCL 50 MG/ML IJ SOLN
50.0000 mg | Freq: Once | INTRAMUSCULAR | Status: AC
  Administered 2020-10-01: 50 mg via INTRAVENOUS
  Filled 2020-10-01: qty 1

## 2020-10-01 MED ORDER — SODIUM CHLORIDE 0.9 % IV SOLN
Freq: Once | INTRAVENOUS | Status: AC
  Filled 2020-10-01: qty 250

## 2020-10-01 MED ORDER — HEPARIN SOD (PORK) LOCK FLUSH 100 UNIT/ML IV SOLN
INTRAVENOUS | Status: AC
  Administered 2020-10-01: 500 [IU] via INTRAVENOUS
  Filled 2020-10-01: qty 5

## 2020-10-01 MED ORDER — HEPARIN SOD (PORK) LOCK FLUSH 100 UNIT/ML IV SOLN
500.0000 [IU] | Freq: Once | INTRAVENOUS | Status: AC
  Filled 2020-10-01: qty 5

## 2020-10-01 MED ORDER — SODIUM CHLORIDE 0.9 % IV SOLN
10.0000 mg | Freq: Once | INTRAVENOUS | Status: AC
  Administered 2020-10-01: 10 mg via INTRAVENOUS
  Filled 2020-10-01: qty 10

## 2020-10-01 MED ORDER — FAMOTIDINE 20 MG IN NS 100 ML IVPB
20.0000 mg | Freq: Once | INTRAVENOUS | Status: AC
  Administered 2020-10-01: 20 mg via INTRAVENOUS
  Filled 2020-10-01: qty 20

## 2020-10-01 MED ORDER — SODIUM CHLORIDE 0.9 % IV SOLN
65.0000 mg/m2 | Freq: Once | INTRAVENOUS | Status: AC
  Administered 2020-10-01: 144 mg via INTRAVENOUS
  Filled 2020-10-01: qty 24

## 2020-10-01 NOTE — Telephone Encounter (Signed)
Called pt and got her voicemail and let her know that b12 level is low and for the next 4 weeks when you get your chemo treatment , Dr. Janese Banks wants you to get b12 shot with each treatment. I am adding this to your chemo appts. And if you have questions , just call me or tell me when you come in next week

## 2020-10-01 NOTE — Telephone Encounter (Signed)
-----   Message from Sindy Guadeloupe, MD sent at 10/01/2020  4:40 PM EDT ----- Please add weekly b12 injections X4

## 2020-10-01 NOTE — Patient Instructions (Signed)
[facility] [place] [facility]  Discharge Instructions: Thank you for choosing [facility] to provide your oncology and hematology care.  If you have a lab appointment with the Cancer Center, please go directly to the Cancer Center and check in at the registration area.  Wear comfortable clothing and clothing appropriate for easy access to any Portacath or PICC line.   We strive to give you quality time with your provider. You may need to reschedule your appointment if you arrive late (15 or more minutes).  Arriving late affects you and other patients whose appointments are after yours.  Also, if you miss three or more appointments without notifying the office, you may be dismissed from the clinic at the provider's discretion.      For prescription refill requests, have your pharmacy contact our office and allow 72 hours for refills to be completed.    Today you received the following chemotherapy and/or immunotherapy agents Taxol        To help prevent nausea and vomiting after your treatment, we encourage you to take your nausea medication as directed.  BELOW ARE SYMPTOMS THAT SHOULD BE REPORTED IMMEDIATELY: *FEVER GREATER THAN 100.4 F (38 C) OR HIGHER *CHILLS OR SWEATING *NAUSEA AND VOMITING THAT IS NOT CONTROLLED WITH YOUR NAUSEA MEDICATION *UNUSUAL SHORTNESS OF BREATH *UNUSUAL BRUISING OR BLEEDING *URINARY PROBLEMS (pain or burning when urinating, or frequent urination) *BOWEL PROBLEMS (unusual diarrhea, constipation, pain near the anus) TENDERNESS IN MOUTH AND THROAT WITH OR WITHOUT PRESENCE OF ULCERS (sore throat, sores in mouth, or a toothache) UNUSUAL RASH, SWELLING OR PAIN  UNUSUAL VAGINAL DISCHARGE OR ITCHING   Items with * indicate a potential emergency and should be followed up as soon as possible or go to the Emergency Department if any problems should occur.  Please show the CHEMOTHERAPY ALERT CARD or IMMUNOTHERAPY ALERT CARD at check-in to  the Emergency Department and triage nurse.  Should you have questions after your visit or need to cancel or reschedule your appointment, please contact [facility] [place] [facility]  [phone] and follow the prompts.  Office hours are 8:00 a.m. to 4:30 p.m. Monday - Friday. Please note that voicemails left after 4:00 p.m. may not be returned until the following business day.  We are closed weekends and major holidays. You have access to a nurse at all times for urgent questions. Please call the main number to the clinic [phone] and follow the prompts.  For any non-urgent questions, you may also contact your provider using MyChart. We now offer e-Visits for anyone 18 and older to request care online for non-urgent symptoms. For details visit [email or web address].   Also download the MyChart app! Go to the app store, search "MyChart", open the app, select [place], and log in with your MyChart username and password.  Due to Covid, a mask is required upon entering the hospital/clinic. If you do not have a mask, one will be given to you upon arrival. For doctor visits, patients may have 1 support person aged 18 or older with them. For treatment visits, patients cannot have anyone with them due to current Covid guidelines and our immunocompromised population.  

## 2020-10-01 NOTE — Progress Notes (Signed)
Hematology/Oncology Consult note [facility]  [phone] [phone]  Patient Care Team: Jerrol Banana., MD as PCP - General (Family Medicine) Lorelee Cover., MD as Consulting Physician (Ophthalmology) Dimmig, Marcello Moores, MD as Referring Physician (Orthopedic Surgery) Oneta Rack, MD (Dermatology) Dasher, Rayvon Char, MD (Dermatology)   Name of the patient: Victoria Dean  [id number]  1950-03-02   Date of visit: 10/01/20  Diagnosis- pathological prognostic stage Ib invasive lobular carcinoma of the left breast [id number] ER/PR positive HER2 negative  Chief complaint/ Reason for visit-on treatment assessment prior to cycle 4 of adjuvant Taxol chemotherapy  Heme/Onc history: Patient is a 70 year old female with a past medical history significant for claustrophobia, hypertension hypercholesterolemia, depression among other medical problems.  Patient began to notice retraction of her left nipple.  This was followed by a diagnostic mammogram which showed vague shadowing in the upper outer quadrant of the left breast without a definitive mass.  Benign cysts in the lower left breast measuring 1.5 cm.  No evidence of malignancy in the right breast.  No evidence of abnormal left axillary lymph nodes.  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   Bilateral breast MRI showed 7 x 6 x 4 cm area of non-mass enhancement in the upper outer left breast at 1 o'clock position.  This involves anterior and middle third of the breast and extension into the posterior third.  2 inferior left axillary lymph nodes with mild cortical thickening.   PET CT scan showed 1.7 cm hypermetabolic nodule in the midline of the neck which could represent thyroglossal cyst but malignancy cannot be  excluded. Patient underwent left-sided lumpectomy with nipple excision.     Final pathology showed invasive lobular carcinoma grade 210.5 cm with associated DCIS.  Skin/nipple are present and involved.  Invasive carcinoma invades the nipple without skin ulceration.  Lymphovascular invasion present.  Margins negative.  3 out of 4 lymph nodes positive for malignancy 11 mm largest deposit with extranodal extension.  pT3 pN1 a cM0   Patient was taken back for axillary dissection given 3 lymph nodes positive with extranodal extension and 4 more lymph nodes out of 9 were positive with extranodal extension   Given cardiovascular comorbidities and patient age none anthracycline adjuvant chemotherapy options were discussed.  Patient did not wish to proceed with TC chemotherapy and has decided with weekly Taxol chemotherapy    Interval history-patient reports exertional shortness of breath and sometimes she has shortness of breath at rest as well.  Denies any chest pain cough fever or productive cough.  Has intermittent tingling numbness in her hands which comes and goes and overall stable  ECOG PS- 2 Pain scale- 0   Review of systems- Review of Systems  Constitutional:  Positive for malaise/fatigue. Negative for chills, fever and weight loss.  HENT:  Negative for congestion, ear discharge and nosebleeds.   Eyes:  Negative for blurred vision.  Respiratory:  Positive for shortness of breath. Negative for cough, hemoptysis, sputum production and wheezing.   Cardiovascular:  Negative for chest pain, palpitations, orthopnea and claudication.  Gastrointestinal:  Negative for abdominal pain, blood in stool, constipation, diarrhea, heartburn, melena, nausea and vomiting.  Genitourinary:  Negative for dysuria, flank pain, frequency, hematuria and urgency.  Musculoskeletal:  Negative for back pain, joint pain and  myalgias.  Skin:  Negative for rash.  Neurological:  Positive for sensory change (Peripheral  neuropathy). Negative for dizziness, tingling, focal weakness, seizures, weakness and headaches.  Endo/Heme/Allergies:  Does not bruise/bleed easily.  Psychiatric/Behavioral:  Negative for depression and suicidal ideas. The patient does not have insomnia.       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    Vitamin D deficiency      Past Surgical History:  Procedure Laterality Date   ABDOMINAL HYSTERECTOMY  1995   APPENDECTOMY  1994   BREAST BIOPSY Right 1998   neg   BREAST BIOPSY Left 05/30/2020   stere bx x clip path pending   BREAST LUMPECTOMY WITH SENTINEL LYMPH NODE BIOPSY Left 06/29/2020   Procedure: BREAST LUMPECTOMY WITH SENTINEL LYMPH NODE BX;  Surgeon: Robert Bellow, MD;  Location: [facility] ORS;  Service: General;  Laterality: Left;   ESI     LUMBAR MICRODISCECTOMY  2019   L3-4   LUMBAR MICRODISCECTOMY  2008   L4-L5 repeated   MICRODISCECTOMY LUMBAR  2008   L4-5   MICRODISCECTOMY LUMBAR  2010   L4-L5 repeated   NODE DISSECTION Left 08/13/2020   Procedure: AXILLARY NODE DISSECTION;  Surgeon: Robert Bellow, MD;  Location: [facility] ORS;  Service: General;  Laterality: Left;   PORTACATH PLACEMENT Right 09/05/2020   Procedure: INSERTION PORT-A-CATH;  Surgeon: Robert Bellow, MD;  Location: [facility] ORS;  Service: General;  Laterality: Right;  right; monitor  anesthesia care   SQUAMOUS CELL CARCINOMA EXCISION     TONSILLECTOMY  1971    Social History   Socioeconomic History   Marital status: Widowed    Spouse name: Not on file   Number of children: 1   Years of education: Not on file   Highest education level: Bachelor's degree (e.g., BA, AB, BS)  Occupational History   Occupation: retired  Tobacco Use   Smoking status: Former    Packs/day: 0.91    Years: 33.00    Pack years: 30.03    Types: Cigarettes    Quit date: 04/19/2018    Years since quitting: 2.4   Smokeless tobacco: Never  Vaping Use   Vaping Use: Never used  Substance and Sexual Activity   Alcohol use: Yes    Comment: rarely   Drug use: No   Sexual activity: Not Currently  Other Topics Concern   Not on file  Social History Narrative   Not on file   Social Determinants of Health   Financial Resource Strain: Not on file  Food Insecurity: Not on file  Transportation Needs: Not on file  Physical Activity: Not on file  Stress: Not on  file  Social Connections: Not on file  Intimate Partner Violence: Not on file    Family History  Problem Relation Age of Onset   Breast cancer Mother 67   Osteosarcoma Mother        d. 70   Hypertension Brother    COPD Father    Hypertension Father    Breast cancer Paternal Aunt        dx 9s   Breast cancer Paternal Grandmother        dx 47s   Stomach cancer Paternal Grandfather        dx 44s   Breast cancer Paternal Aunt        dx 62s   Brain cancer Paternal Aunt        dx 59s     Current Outpatient Medications:    acetaminophen (TYLENOL) 650 MG CR tablet, Take 1,300 mg by mouth every 8 (eight) hours as needed for pain., Disp: , Rfl:    Biotin 1000 MCG tablet, Take 1,000 mcg by mouth daily., Disp: , Rfl:    cholecalciferol (VITAMIN D) 25 MCG (1000 UNIT) tablet, Take 1,000 Units by mouth daily., Disp: , Rfl:    diazepam (VALIUM) 5 MG tablet, Take 1 tablet (5 mg total) by mouth every 12 (twelve) hours as needed for  anxiety. (Patient taking differently: Take 5 mg by mouth in the morning.), Disp: 60 tablet, Rfl: 5   hydrocortisone 2.5 % lotion, Apply 1 application topically daily as needed (when washing face)., Disp: , Rfl:    lidocaine-prilocaine (EMLA) cream, Apply to affected area once, Disp: 30 g, Rfl: 3   losartan (COZAAR) 100 MG tablet, TAKE 1 TABLET (100 MG TOTAL) BY MOUTH DAILY., Disp: 90 tablet, Rfl: 1   Menthol, Topical Analgesic, (BIOFREEZE EX), Apply 1 application topically daily as needed (back pain)., Disp: , Rfl:    mometasone (ELOCON) 0.1 % cream, Apply 1 application topically daily as needed (ear irritation)., Disp: , Rfl:    Polyethyl Glycol-Propyl Glycol (SYSTANE OP), Place 1 drop into both eyes daily as needed (dry eyes)., Disp: , Rfl:    rosuvastatin (CRESTOR) 5 MG tablet, TAKE 1 TABLET (5 MG TOTAL) BY MOUTH DAILY., Disp: 90 tablet, Rfl: 1   triamterene-hydrochlorothiazide (DYAZIDE) 37.5-25 MG capsule, Take 1 each (1 capsule total) by mouth daily., Disp: 90 capsule, Rfl: 4   venlafaxine XR (EFFEXOR XR) 75 MG 24 hr capsule, Take 3 capsules (225 mg total) by mouth daily with breakfast., Disp: 270 capsule, Rfl: 1   ondansetron (ZOFRAN) 8 MG tablet, Take 1 tablet (8 mg total) by mouth 2 (two) times daily as needed (Nausea or vomiting). (Patient not taking: No sig reported), Disp: 30 tablet, Rfl: 1   prochlorperazine (COMPAZINE) 10 MG tablet, Take 1 tablet (10 mg total) by mouth every 6 (six) hours as needed (Nausea or vomiting). (Patient not taking: No sig reported), Disp: 30 tablet, Rfl: 1 No current facility-administered medications for this visit.  Facility-Administered Medications Ordered in Other Visits:    sodium chloride flush (NS) 0.9 % injection 10 mL, 10 mL, Intravenous, PRN, Sindy Guadeloupe, MD, 10 mL at 10/01/20 0931  Physical exam:  Vitals:   10/01/20 1003  BP: 132/74  Pulse: 95  Resp: 18  Temp: 98 F (36.7 C)  TempSrc: Tympanic  SpO2: 97%  Weight: 237 lb (107.5 kg)    Physical Exam Constitutional:      General: She is not in acute distress.    Comments: Ambulates with a  cane  Cardiovascular:     Rate and Rhythm: Normal rate and regular rhythm.     Heart sounds: Normal heart sounds.  Pulmonary:     Effort: Pulmonary effort is normal.     Breath sounds: Normal breath sounds.  Abdominal:     General: Bowel sounds are normal.     Palpations: Abdomen is soft.  Skin:    General: Skin is warm and dry.  Neurological:     Mental Status: She is alert and oriented to person, place, and time.     CMP Latest Ref Rng & Units 10/01/2020  Glucose 70 - 99 mg/dL 123(H)  BUN 8 - 23 mg/dL 25(H)  Creatinine 0.44 - 1.00 mg/dL 0.87  Sodium 135 - 145 mmol/L 139  Potassium 3.5 - 5.1 mmol/L 3.4(L)  Chloride 98 - 111 mmol/L 102  CO2 22 - 32 mmol/L 29  Calcium 8.9 - 10.3 mg/dL 8.8(L)  Total Protein 6.5 - 8.1 g/dL 6.8  Total Bilirubin 0.3 - 1.2 mg/dL 0.5  Alkaline Phos 38 - 126 U/L 110  AST 15 - 41 U/L 26  ALT 0 - 44 U/L 29   CBC Latest Ref Rng & Units 10/01/2020  WBC 4.0 - 10.5 K/uL 5.4  Hemoglobin 12.0 - 15.0 g/dL 10.7(L)  Hematocrit 36.0 - 46.0 % 32.8(L)  Platelets 150 - 400 K/uL 290    No images are attached to the encounter.  DG Chest Port 1 View  Result Date: 09/05/2020 CLINICAL DATA:  Postoperative port placement. EXAM: PORTABLE CHEST 1 VIEW COMPARISON:  06/16/2008 x-ray, 03/27/2020 CT FINDINGS: Interval placement of right internal jugular approach Port-A-Cath with distal tip terminating at the level of the superior cavoatrial junction. Heart size is normal. No focal airspace consolidation, pleural effusion, or pneumothorax. IMPRESSION: Interval placement of right internal jugular approach Port-A-Cath with distal tip at the level of the superior cavoatrial junction. No pneumothorax. Electronically Signed   By: Davina Poke D.O.   On: 09/05/2020 14:04   DG C-Arm 1-60 Min-No Report  Result Date: 09/05/2020 Fluoroscopy was utilized by the requesting  physician.  No radiographic interpretation.     Assessment and plan- Patient is a 70 y.o. 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  She is here for on treatment assessment prior to cycle 4 of adjuvant Taxol chemotherapy  Counts okay to proceed with cycle 4 of adjuvant Taxol chemotherapy today which she is receiving at a reduced dose of 65 mg per metered square.  She will proceed with cycle 5 next week and I will see her back in 2 weeks for cycle 6  Exertional shortness of breath: Likely multifactorial secondary to chemo induced anemia and anxiety.  I will also get a baseline echocardiogram.  She had a recent chest x-ray on 09/05/2020 which did not show any evidence of consolidation pleural effusion or pneumothorax.  Continue to monitor   Visit Diagnosis 1. Malignant neoplasm of upper-outer quadrant of left breast in female, estrogen receptor positive ([place])      Dr. Randa Evens, MD, MPH [facility] at [facility] [phone] 10/01/2020 4:03 PM

## 2020-10-01 NOTE — Progress Notes (Signed)
Patient here for oncology follow-up appointment,  concerns of chest discomfort, SOB, diarrhea and numbness

## 2020-10-09 ENCOUNTER — Inpatient Hospital Stay: Payer: Medicare PPO | Attending: Oncology

## 2020-10-09 ENCOUNTER — Other Ambulatory Visit: Payer: Self-pay | Admitting: Oncology

## 2020-10-09 ENCOUNTER — Inpatient Hospital Stay: Payer: Medicare PPO

## 2020-10-09 ENCOUNTER — Other Ambulatory Visit: Payer: Self-pay

## 2020-10-09 VITALS — BP 129/67 | HR 81 | Temp 95.5°F | Resp 20 | Wt 234.0 lb

## 2020-10-09 DIAGNOSIS — E538 Deficiency of other specified B group vitamins: Secondary | ICD-10-CM

## 2020-10-09 DIAGNOSIS — C50412 Malignant neoplasm of upper-outer quadrant of left female breast: Secondary | ICD-10-CM | POA: Insufficient documentation

## 2020-10-09 DIAGNOSIS — Z170 Estrogen receptor positive status [ER+]: Secondary | ICD-10-CM | POA: Diagnosis not present

## 2020-10-09 DIAGNOSIS — Z79899 Other long term (current) drug therapy: Secondary | ICD-10-CM | POA: Insufficient documentation

## 2020-10-09 DIAGNOSIS — Z5111 Encounter for antineoplastic chemotherapy: Secondary | ICD-10-CM | POA: Diagnosis not present

## 2020-10-09 DIAGNOSIS — C773 Secondary and unspecified malignant neoplasm of axilla and upper limb lymph nodes: Secondary | ICD-10-CM | POA: Diagnosis not present

## 2020-10-09 LAB — CBC WITH DIFFERENTIAL/PLATELET
Abs Immature Granulocytes: 0.05 10*3/uL (ref 0.00–0.07)
Basophils Absolute: 0.1 10*3/uL (ref 0.0–0.1)
Basophils Relative: 1 %
Eosinophils Absolute: 0.1 10*3/uL (ref 0.0–0.5)
Eosinophils Relative: 2 %
HCT: 32.8 % — ABNORMAL LOW (ref 36.0–46.0)
Hemoglobin: 10.5 g/dL — ABNORMAL LOW (ref 12.0–15.0)
Immature Granulocytes: 1 %
Lymphocytes Relative: 38 %
Lymphs Abs: 2.2 10*3/uL (ref 0.7–4.0)
MCH: 29.6 pg (ref 26.0–34.0)
MCHC: 32 g/dL (ref 30.0–36.0)
MCV: 92.4 fL (ref 80.0–100.0)
Monocytes Absolute: 0.6 10*3/uL (ref 0.1–1.0)
Monocytes Relative: 11 %
Neutro Abs: 2.7 10*3/uL (ref 1.7–7.7)
Neutrophils Relative %: 47 %
Platelets: 289 10*3/uL (ref 150–400)
RBC: 3.55 MIL/uL — ABNORMAL LOW (ref 3.87–5.11)
RDW: 15.5 % (ref 11.5–15.5)
WBC: 5.7 10*3/uL (ref 4.0–10.5)
nRBC: 0 % (ref 0.0–0.2)

## 2020-10-09 LAB — COMPREHENSIVE METABOLIC PANEL
ALT: 25 U/L (ref 0–44)
AST: 19 U/L (ref 15–41)
Albumin: 3.7 g/dL (ref 3.5–5.0)
Alkaline Phosphatase: 113 U/L (ref 38–126)
Anion gap: 8 (ref 5–15)
BUN: 26 mg/dL — ABNORMAL HIGH (ref 8–23)
CO2: 29 mmol/L (ref 22–32)
Calcium: 9.2 mg/dL (ref 8.9–10.3)
Chloride: 101 mmol/L (ref 98–111)
Creatinine, Ser: 1.1 mg/dL — ABNORMAL HIGH (ref 0.44–1.00)
GFR, Estimated: 54 mL/min — ABNORMAL LOW (ref >60)
Glucose, Bld: 118 mg/dL — ABNORMAL HIGH (ref 70–99)
Potassium: 3.3 mmol/L — ABNORMAL LOW (ref 3.5–5.1)
Sodium: 138 mmol/L (ref 135–145)
Total Bilirubin: 0.5 mg/dL (ref 0.3–1.2)
Total Protein: 7 g/dL (ref 6.5–8.1)

## 2020-10-09 MED ORDER — SODIUM CHLORIDE 0.9 % IV SOLN
10.0000 mg | Freq: Once | INTRAVENOUS | Status: AC
  Administered 2020-10-09: 10 mg via INTRAVENOUS
  Filled 2020-10-09: qty 10

## 2020-10-09 MED ORDER — CYANOCOBALAMIN 1000 MCG/ML IJ SOLN
1000.0000 ug | INTRAMUSCULAR | Status: DC
  Administered 2020-10-09: 1000 ug via INTRAMUSCULAR
  Filled 2020-10-09: qty 1

## 2020-10-09 MED ORDER — SODIUM CHLORIDE 0.9 % IV SOLN
Freq: Once | INTRAVENOUS | Status: AC
  Filled 2020-10-09: qty 250

## 2020-10-09 MED ORDER — DIPHENHYDRAMINE HCL 50 MG/ML IJ SOLN
50.0000 mg | Freq: Once | INTRAMUSCULAR | Status: AC
  Administered 2020-10-09: 50 mg via INTRAVENOUS
  Filled 2020-10-09: qty 1

## 2020-10-09 MED ORDER — SODIUM CHLORIDE 0.9 % IV SOLN
65.0000 mg/m2 | Freq: Once | INTRAVENOUS | Status: AC
  Administered 2020-10-09: 144 mg via INTRAVENOUS
  Filled 2020-10-09: qty 24

## 2020-10-09 MED ORDER — HEPARIN SOD (PORK) LOCK FLUSH 100 UNIT/ML IV SOLN
INTRAVENOUS | Status: AC
  Filled 2020-10-09: qty 5

## 2020-10-09 MED ORDER — FAMOTIDINE 20 MG IN NS 100 ML IVPB
20.0000 mg | Freq: Once | INTRAVENOUS | Status: AC
  Administered 2020-10-09: 20 mg via INTRAVENOUS
  Filled 2020-10-09: qty 20

## 2020-10-09 MED ORDER — HEPARIN SOD (PORK) LOCK FLUSH 100 UNIT/ML IV SOLN
500.0000 [IU] | Freq: Once | INTRAVENOUS | Status: AC | PRN
  Administered 2020-10-09: 500 [IU]
  Filled 2020-10-09: qty 5

## 2020-10-09 NOTE — Patient Instructions (Signed)
[facility]  Discharge Instructions: Thank you for choosing [facility] to provide your oncology and hematology care.  If you have a lab appointment with the [facility], please go directly to the [facility] and check in at the registration area.  Wear comfortable clothing and clothing appropriate for easy access to any Portacath or PICC line.   We strive to give you quality time with your provider. You may need to reschedule your appointment if you arrive late (15 or more minutes).  Arriving late affects you and other patients whose appointments are after yours.  Also, if you miss three or more appointments without notifying the office, you may be dismissed from the clinic at the provider's discretion.      For prescription refill requests, have your pharmacy contact our office and allow 72 hours for refills to be completed.    Today you received the following chemotherapy and/or immunotherapy agents: Taxol      To help prevent nausea and vomiting after your treatment, we encourage you to take your nausea medication as directed.  BELOW ARE SYMPTOMS THAT SHOULD BE REPORTED IMMEDIATELY: *FEVER GREATER THAN 100.4 F (38 C) OR HIGHER *CHILLS OR SWEATING *NAUSEA AND VOMITING THAT IS NOT CONTROLLED WITH YOUR NAUSEA MEDICATION *UNUSUAL SHORTNESS OF BREATH *UNUSUAL BRUISING OR BLEEDING *URINARY PROBLEMS (pain or burning when urinating, or frequent urination) *BOWEL PROBLEMS (unusual diarrhea, constipation, pain near the anus) TENDERNESS IN MOUTH AND THROAT WITH OR WITHOUT PRESENCE OF ULCERS (sore throat, sores in mouth, or a toothache) UNUSUAL RASH, SWELLING OR PAIN  UNUSUAL VAGINAL DISCHARGE OR ITCHING   Items with * indicate a potential emergency and should be followed up as soon as possible or go to the Emergency Department if any problems should occur.  Please show the CHEMOTHERAPY ALERT CARD or IMMUNOTHERAPY ALERT CARD at check-in to  the Emergency Department and triage nurse.  Should you have questions after your visit or need to cancel or reschedule your appointment, please contact [facility]  [phone] and follow the prompts.  Office hours are 8:00 a.m. to 4:30 p.m. Monday - Friday. Please note that voicemails left after 4:00 p.m. may not be returned until the following business day.  We are closed weekends and major holidays. You have access to a nurse at all times for urgent questions. Please call the main number to the clinic [phone] and follow the prompts.  For any non-urgent questions, you may also contact your provider using MyChart. We now offer e-Visits for anyone 72 and older to request care online for non-urgent symptoms. For details visit [email or web address].   Also download the MyChart app! Go to the app store, search "MyChart", open the app, select [facility], and log in with your MyChart username and password.  Due to Covid, a mask is required upon entering the hospital/clinic. If you do not have a mask, one will be given to you upon arrival. For doctor visits, patients may have 1 support person aged 72 or older with them. For treatment visits, patients cannot have anyone with them due to current Covid guidelines and our immunocompromised population. Paclitaxel injection What is this medication? PACLITAXEL (PAK li TAX el) is a chemotherapy drug. It targets fast dividing cells, like cancer cells, and causes these cells to die. This medicine is used to treat ovarian cancer, breast cancer, lung cancer, Kaposi's sarcoma, and other cancers. This medicine may be used for other purposes; ask your health  care provider or pharmacist if you have questions. COMMON BRAND NAME(S): Onxol, Taxol What should I tell my care team before I take this medication? They need to know if you have any of these conditions: history of irregular heartbeat liver disease low blood counts, like  low white cell, platelet, or red cell counts lung or breathing disease, like asthma tingling of the fingers or toes, or other nerve disorder an unusual or allergic reaction to paclitaxel, alcohol, polyoxyethylated castor oil, other chemotherapy, other medicines, foods, dyes, or preservatives pregnant or trying to get pregnant breast-feeding How should I use this medication? This drug is given as an infusion into a vein. It is administered in a hospital or clinic by a specially trained health care professional. Talk to your pediatrician regarding the use of this medicine in children. Special care may be needed. Overdosage: If you think you have taken too much of this medicine contact a poison control center or emergency room at once. NOTE: This medicine is only for you. Do not share this medicine with others. What if I miss a dose? It is important not to miss your dose. Call your doctor or health care professional if you are unable to keep an appointment. What may interact with this medication? Do not take this medicine with any of the following medications: live virus vaccines This medicine may also interact with the following medications: antiviral medicines for hepatitis, HIV or AIDS certain antibiotics like erythromycin and clarithromycin certain medicines for fungal infections like ketoconazole and itraconazole certain medicines for seizures like carbamazepine, phenobarbital, phenytoin gemfibrozil nefazodone rifampin St. John's wort This list may not describe all possible interactions. Give your health care provider a list of all the medicines, herbs, non-prescription drugs, or dietary supplements you use. Also tell them if you smoke, drink alcohol, or use illegal drugs. Some items may interact with your medicine. What should I watch for while using this medication? Your condition will be monitored carefully while you are receiving this medicine. You will need important blood work done  while you are taking this medicine. This medicine can cause serious allergic reactions. To reduce your risk you will need to take other medicine(s) before treatment with this medicine. If you experience allergic reactions like skin rash, itching or hives, swelling of the face, lips, or tongue, tell your doctor or health care professional right away. In some cases, you may be given additional medicines to help with side effects. Follow all directions for their use. This drug may make you feel generally unwell. This is not uncommon, as chemotherapy can affect healthy cells as well as cancer cells. Report any side effects. Continue your course of treatment even though you feel ill unless your doctor tells you to stop. Call your doctor or health care professional for advice if you get a fever, chills or sore throat, or other symptoms of a cold or flu. Do not treat yourself. This drug decreases your body's ability to fight infections. Try to avoid being around people who are sick. This medicine may increase your risk to bruise or bleed. Call your doctor or health care professional if you notice any unusual bleeding. Be careful brushing and flossing your teeth or using a toothpick because you may get an infection or bleed more easily. If you have any dental work done, tell your dentist you are receiving this medicine. Avoid taking products that contain aspirin, acetaminophen, ibuprofen, naproxen, or ketoprofen unless instructed by your doctor. These medicines may hide a fever. Do  not become pregnant while taking this medicine. Women should inform their doctor if they wish to become pregnant or think they might be pregnant. There is a potential for serious side effects to an unborn child. Talk to your health care professional or pharmacist for more information. Do not breast-feed an infant while taking this medicine. Men are advised not to father a child while receiving this medicine. This product may contain  alcohol. Ask your pharmacist or healthcare provider if this medicine contains alcohol. Be sure to tell all healthcare providers you are taking this medicine. Certain medicines, like metronidazole and disulfiram, can cause an unpleasant reaction when taken with alcohol. The reaction includes flushing, headache, nausea, vomiting, sweating, and increased thirst. The reaction can last from 30 minutes to several hours. What side effects may I notice from receiving this medication? Side effects that you should report to your doctor or health care professional as soon as possible: allergic reactions like skin rash, itching or hives, swelling of the face, lips, or tongue breathing problems changes in vision fast, irregular heartbeat high or low blood pressure mouth sores pain, tingling, numbness in the hands or feet signs of decreased platelets or bleeding - bruising, pinpoint red spots on the skin, black, tarry stools, blood in the urine signs of decreased red blood cells - unusually weak or tired, feeling faint or lightheaded, falls signs of infection - fever or chills, cough, sore throat, pain or difficulty passing urine signs and symptoms of liver injury like dark yellow or brown urine; general ill feeling or flu-like symptoms; light-colored stools; loss of appetite; nausea; right upper belly pain; unusually weak or tired; yellowing of the eyes or skin swelling of the ankles, feet, hands unusually slow heartbeat Side effects that usually do not require medical attention (report to your doctor or health care professional if they continue or are bothersome): diarrhea hair loss loss of appetite muscle or joint pain nausea, vomiting pain, redness, or irritation at site where injected tiredness This list may not describe all possible side effects. Call your doctor for medical advice about side effects. You may report side effects to FDA at 1-800-FDA-1088. Where should I keep my medication? This drug  is given in a hospital or clinic and will not be stored at home. NOTE: This sheet is a summary. It may not cover all possible information. If you have questions about this medicine, talk to your doctor, pharmacist, or health care provider.  2022 Elsevier/Gold Standard (2018-12-22 13:37:23) Vitamin B12 Injection What is this medication? Vitamin B12 (VAHY tuh min B12) prevents and treats low vitamin B12 levels in your body. It is used in people who do not get enough vitamin B12 from their diet or when their digestive tract does not absorb enough. Vitamin B12 plays an important role in maintaining the health of your nervous system and red blood cells. This medicine may be used for other purposes; ask your health care provider or pharmacist if you have questions. COMMON BRAND NAME(S): B-12 Compliance Kit, B-12 Injection Kit, Cyomin, Dodex, LA-12, Nutri-Twelve, Physicians EZ Use B-12, Primabalt What should I tell my care team before I take this medication? They need to know if you have any of these conditions: Kidney disease Leber's disease Megaloblastic anemia An unusual or allergic reaction to cyanocobalamin, cobalt, other medications, foods, dyes, or preservatives Pregnant or trying to get pregnant Breast-feeding How should I use this medication? This medication is injected into a muscle or deeply under the skin. It is usually given  in a clinic or care team's office. However, your care team may teach you how to inject yourself. Follow all instructions. Talk to your care team about the use of this medication in children. Special care may be needed. Overdosage: If you think you have taken too much of this medicine contact a poison control center or emergency room at once. NOTE: This medicine is only for you. Do not share this medicine with others. What if I miss a dose? If you are given your dose at a clinic or care team's office, call to reschedule your appointment. If you give your own  injections, and you miss a dose, take it as soon as you can. If it is almost time for your next dose, take only that dose. Do not take double or extra doses. What may interact with this medication? Colchicine Heavy alcohol intake This list may not describe all possible interactions. Give your health care provider a list of all the medicines, herbs, non-prescription drugs, or dietary supplements you use. Also tell them if you smoke, drink alcohol, or use illegal drugs. Some items may interact with your medicine. What should I watch for while using this medication? Visit your care team regularly. You may need blood work done while you are taking this medication. You may need to follow a special diet. Talk to your care team. Limit your alcohol intake and avoid smoking to get the best benefit. What side effects may I notice from receiving this medication? Side effects that you should report to your care team as soon as possible: Allergic reactions-skin rash, itching, hives, swelling of the face, lips, tongue, or throat Swelling of the ankles, hands, or feet Trouble breathing Side effects that usually do not require medical attention (report to your care team if they continue or are bothersome): Diarrhea This list may not describe all possible side effects. Call your doctor for medical advice about side effects. You may report side effects to FDA at 1-800-FDA-1088. Where should I keep my medication? Keep out of the reach of children. Store at room temperature between 15 and 30 degrees C (59 and 85 degrees F). Protect from light. Throw away any unused medication after the expiration date. NOTE: This sheet is a summary. It may not cover all possible information. If you have questions about this medicine, talk to your doctor, pharmacist, or health care provider.  2022 Elsevier/Gold Standard (2020-03-12 11:47:06)

## 2020-10-15 ENCOUNTER — Inpatient Hospital Stay: Payer: Medicare PPO

## 2020-10-15 ENCOUNTER — Other Ambulatory Visit: Payer: Medicare PPO

## 2020-10-15 ENCOUNTER — Ambulatory Visit: Payer: Medicare PPO | Admitting: Oncology

## 2020-10-15 ENCOUNTER — Inpatient Hospital Stay: Payer: Medicare PPO | Admitting: Oncology

## 2020-10-15 ENCOUNTER — Encounter: Payer: Self-pay | Admitting: Oncology

## 2020-10-15 ENCOUNTER — Ambulatory Visit: Payer: Medicare PPO

## 2020-10-15 VITALS — BP 122/66 | HR 90 | Temp 97.9°F | Resp 18 | Wt 233.7 lb

## 2020-10-15 DIAGNOSIS — C50412 Malignant neoplasm of upper-outer quadrant of left female breast: Secondary | ICD-10-CM

## 2020-10-15 DIAGNOSIS — Z5111 Encounter for antineoplastic chemotherapy: Secondary | ICD-10-CM | POA: Diagnosis not present

## 2020-10-15 DIAGNOSIS — Z79899 Other long term (current) drug therapy: Secondary | ICD-10-CM | POA: Diagnosis not present

## 2020-10-15 DIAGNOSIS — E538 Deficiency of other specified B group vitamins: Secondary | ICD-10-CM | POA: Diagnosis not present

## 2020-10-15 DIAGNOSIS — Z170 Estrogen receptor positive status [ER+]: Secondary | ICD-10-CM

## 2020-10-15 DIAGNOSIS — C773 Secondary and unspecified malignant neoplasm of axilla and upper limb lymph nodes: Secondary | ICD-10-CM | POA: Diagnosis not present

## 2020-10-15 LAB — COMPREHENSIVE METABOLIC PANEL
ALT: 27 U/L (ref 0–44)
AST: 19 U/L (ref 15–41)
Albumin: 3.8 g/dL (ref 3.5–5.0)
Alkaline Phosphatase: 124 U/L (ref 38–126)
Anion gap: 9 (ref 5–15)
BUN: 34 mg/dL — ABNORMAL HIGH (ref 8–23)
CO2: 30 mmol/L (ref 22–32)
Calcium: 9.2 mg/dL (ref 8.9–10.3)
Chloride: 98 mmol/L (ref 98–111)
Creatinine, Ser: 1.01 mg/dL — ABNORMAL HIGH (ref 0.44–1.00)
GFR, Estimated: 60 mL/min — ABNORMAL LOW (ref >60)
Glucose, Bld: 100 mg/dL — ABNORMAL HIGH (ref 70–99)
Potassium: 3.4 mmol/L — ABNORMAL LOW (ref 3.5–5.1)
Sodium: 137 mmol/L (ref 135–145)
Total Bilirubin: 0.4 mg/dL (ref 0.3–1.2)
Total Protein: 6.9 g/dL (ref 6.5–8.1)

## 2020-10-15 LAB — CBC WITH DIFFERENTIAL/PLATELET
Abs Immature Granulocytes: 0.01 10*3/uL (ref 0.00–0.07)
Basophils Absolute: 0 10*3/uL (ref 0.0–0.1)
Basophils Relative: 1 %
Eosinophils Absolute: 0 10*3/uL (ref 0.0–0.5)
Eosinophils Relative: 1 %
HCT: 32.2 % — ABNORMAL LOW (ref 36.0–46.0)
Hemoglobin: 10.5 g/dL — ABNORMAL LOW (ref 12.0–15.0)
Immature Granulocytes: 0 %
Lymphocytes Relative: 40 %
Lymphs Abs: 2.2 10*3/uL (ref 0.7–4.0)
MCH: 30 pg (ref 26.0–34.0)
MCHC: 32.6 g/dL (ref 30.0–36.0)
MCV: 92 fL (ref 80.0–100.0)
Monocytes Absolute: 0.4 10*3/uL (ref 0.1–1.0)
Monocytes Relative: 7 %
Neutro Abs: 2.8 10*3/uL (ref 1.7–7.7)
Neutrophils Relative %: 51 %
Platelets: 259 10*3/uL (ref 150–400)
RBC: 3.5 MIL/uL — ABNORMAL LOW (ref 3.87–5.11)
RDW: 15.8 % — ABNORMAL HIGH (ref 11.5–15.5)
WBC: 5.5 10*3/uL (ref 4.0–10.5)
nRBC: 0 % (ref 0.0–0.2)

## 2020-10-15 MED ORDER — SODIUM CHLORIDE 0.9% FLUSH
10.0000 mL | Freq: Once | INTRAVENOUS | Status: DC
  Filled 2020-10-15: qty 10

## 2020-10-15 MED ORDER — SODIUM CHLORIDE 0.9 % IV SOLN
65.0000 mg/m2 | Freq: Once | INTRAVENOUS | Status: AC
  Administered 2020-10-15: 144 mg via INTRAVENOUS
  Filled 2020-10-15: qty 24

## 2020-10-15 MED ORDER — HEPARIN SOD (PORK) LOCK FLUSH 100 UNIT/ML IV SOLN
INTRAVENOUS | Status: AC
  Filled 2020-10-15: qty 5

## 2020-10-15 MED ORDER — HEPARIN SOD (PORK) LOCK FLUSH 100 UNIT/ML IV SOLN
500.0000 [IU] | Freq: Once | INTRAVENOUS | Status: AC
  Administered 2020-10-15: 500 [IU] via INTRAVENOUS
  Filled 2020-10-15: qty 5

## 2020-10-15 MED ORDER — SODIUM CHLORIDE 0.9 % IV SOLN
10.0000 mg | Freq: Once | INTRAVENOUS | Status: AC
  Administered 2020-10-15: 10 mg via INTRAVENOUS
  Filled 2020-10-15: qty 10

## 2020-10-15 MED ORDER — SODIUM CHLORIDE 0.9 % IV SOLN
Freq: Once | INTRAVENOUS | Status: AC
  Filled 2020-10-15: qty 250

## 2020-10-15 MED ORDER — FAMOTIDINE 20 MG IN NS 100 ML IVPB
20.0000 mg | Freq: Once | INTRAVENOUS | Status: AC
  Administered 2020-10-15: 20 mg via INTRAVENOUS
  Filled 2020-10-15: qty 20

## 2020-10-15 MED ORDER — CYANOCOBALAMIN 1000 MCG/ML IJ SOLN
1000.0000 ug | INTRAMUSCULAR | Status: DC
  Administered 2020-10-15: 1000 ug via INTRAMUSCULAR
  Filled 2020-10-15: qty 1

## 2020-10-15 MED ORDER — DIPHENHYDRAMINE HCL 50 MG/ML IJ SOLN
50.0000 mg | Freq: Once | INTRAMUSCULAR | Status: AC
  Administered 2020-10-15: 50 mg via INTRAVENOUS
  Filled 2020-10-15: qty 1

## 2020-10-15 NOTE — Patient Instructions (Signed)
[facility]  Discharge Instructions: Thank you for choosing [facility] to provide your oncology and hematology care.  If you have a lab appointment with the Cancer Center, please go directly to the Cancer Center and check in at the registration area.  Wear comfortable clothing and clothing appropriate for easy access to any Portacath or PICC line.   We strive to give you quality time with your provider. You may need to reschedule your appointment if you arrive late (15 or more minutes).  Arriving late affects you and other patients whose appointments are after yours.  Also, if you miss three or more appointments without notifying the office, you may be dismissed from the clinic at the provider's discretion.      For prescription refill requests, have your pharmacy contact our office and allow 72 hours for refills to be completed.    Today you received the following chemotherapy and/or immunotherapy agents Taxol        To help prevent nausea and vomiting after your treatment, we encourage you to take your nausea medication as directed.  BELOW ARE SYMPTOMS THAT SHOULD BE REPORTED IMMEDIATELY: *FEVER GREATER THAN 100.4 F (38 C) OR HIGHER *CHILLS OR SWEATING *NAUSEA AND VOMITING THAT IS NOT CONTROLLED WITH YOUR NAUSEA MEDICATION *UNUSUAL SHORTNESS OF BREATH *UNUSUAL BRUISING OR BLEEDING *URINARY PROBLEMS (pain or burning when urinating, or frequent urination) *BOWEL PROBLEMS (unusual diarrhea, constipation, pain near the anus) TENDERNESS IN MOUTH AND THROAT WITH OR WITHOUT PRESENCE OF ULCERS (sore throat, sores in mouth, or a toothache) UNUSUAL RASH, SWELLING OR PAIN  UNUSUAL VAGINAL DISCHARGE OR ITCHING   Items with * indicate a potential emergency and should be followed up as soon as possible or go to the Emergency Department if any problems should occur.  Please show the CHEMOTHERAPY ALERT CARD or IMMUNOTHERAPY ALERT CARD at check-in to  the Emergency Department and triage nurse.  Should you have questions after your visit or need to cancel or reschedule your appointment, please contact [facility]  [phone] and follow the prompts.  Office hours are 8:00 a.m. to 4:30 p.m. Monday - Friday. Please note that voicemails left after 4:00 p.m. may not be returned until the following business day.  We are closed weekends and major holidays. You have access to a nurse at all times for urgent questions. Please call the main number to the clinic [phone] and follow the prompts.  For any non-urgent questions, you may also contact your provider using MyChart. We now offer e-Visits for anyone 18 and older to request care online for non-urgent symptoms. For details visit [email or web address].   Also download the MyChart app! Go to the app store, search "MyChart", open the app, select [facility], and log in with your MyChart username and password.  Due to Covid, a mask is required upon entering the hospital/clinic. If you do not have a mask, one will be given to you upon arrival. For doctor visits, patients may have 1 support person aged 18 or older with them. For treatment visits, patients cannot have anyone with them due to current Covid guidelines and our immunocompromised population.  

## 2020-10-15 NOTE — Progress Notes (Signed)
Hematology/Oncology Consult note [facility]  Telephone:([phone] Fax:([phone]) [phone]  Patient Care Team: Maple Hudson., MD as PCP - General (Family Medicine) Irene Limbo., MD as Consulting Physician (Ophthalmology) Dimmig, Maisie Fus, MD as Referring Physician (Orthopedic Surgery) Debbrah Alar, MD (Dermatology) Dasher, Cliffton Asters, MD (Dermatology) Creig Hines, MD as Consulting Physician (Hematology and Oncology)   Name of the patient: Victoria Dean  [id number]  [birth year]   Date of visit: 10/15/20  Diagnosis- pathological prognostic stage Ib invasive lobular carcinoma of the left breast [id number] ER/PR positive HER2 negative  Chief complaint/ Reason for visit-on treatment assessment prior to cycle 6 of weekly Taxol chemotherapy  Heme/Onc history: Patient is a 70 year old female with a past medical history significant for claustrophobia, hypertension hypercholesterolemia, depression among other medical problems.  Patient began to notice retraction of her left nipple.  This was followed by a diagnostic mammogram which showed vague shadowing in the upper outer quadrant of the left breast without a definitive mass.  Benign cysts in the lower left breast measuring 1.5 cm.  No evidence of malignancy in the right breast.  No evidence of abnormal left axillary lymph nodes.  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   Bilateral breast MRI showed 7 x 6 x 4 cm area of non-mass enhancement in the upper outer left breast at 1 o'clock position.  This involves anterior and middle third of the breast and extension into the posterior third.  2 inferior left axillary lymph nodes with mild cortical thickening.   PET CT scan showed 1.7 cm hypermetabolic nodule in the midline of the  neck which could represent thyroglossal cyst but malignancy cannot be excluded. Patient underwent left-sided lumpectomy with nipple excision.     Final pathology showed invasive lobular carcinoma grade 210.5 cm with associated DCIS.  Skin/nipple are present and involved.  Invasive carcinoma invades the nipple without skin ulceration.  Lymphovascular invasion present.  Margins negative.  3 out of 4 lymph nodes positive for malignancy 11 mm largest deposit with extranodal extension.  pT3 pN1 a cM0   Patient was taken back for axillary dissection given 3 lymph nodes positive with extranodal extension and 4 more lymph nodes out of 9 were positive with extranodal extension   Given cardiovascular comorbidities and patient age none anthracycline adjuvant chemotherapy options were discussed.  Patient did not wish to proceed with TC chemotherapy and has decided with weekly Taxol chemotherapy  Interval history-patient reports improvement in her energy levels with 1 B12 injection that she received last week.  She still has baseline shortness of breath which is somewhat improved after her B12 injection but she continues to have exertional shortness of breath.  She has an echocardiogram coming up soon.  ECOG PS- 2 Pain scale- 0   Review of systems- Review of Systems  Constitutional:  Positive for malaise/fatigue. Negative for chills, fever and weight loss.  HENT:  Negative for congestion, ear discharge and nosebleeds.   Eyes:  Negative for blurred vision.  Respiratory:  Positive for shortness of breath. Negative for cough, hemoptysis, sputum production and wheezing.   Cardiovascular:  Negative for chest pain, palpitations, orthopnea and claudication.  Gastrointestinal:  Negative for abdominal pain, blood in stool, constipation, diarrhea, heartburn, melena, nausea and vomiting.  Genitourinary:  Negative for dysuria, flank  pain, frequency, hematuria and urgency.  Musculoskeletal:  Negative for back pain,  joint pain and myalgias.  Skin:  Negative for rash.  Neurological:  Positive for sensory change (Peripheral neuropathy). Negative for dizziness, tingling, focal weakness, seizures, weakness and headaches.  Endo/Heme/Allergies:  Does not bruise/bleed easily.  Psychiatric/Behavioral:  Negative for depression and suicidal ideas. The patient does not have insomnia.       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    Vitamin D deficiency      Past Surgical History:  Procedure Laterality Date   ABDOMINAL HYSTERECTOMY  1995   APPENDECTOMY  1994   BREAST BIOPSY Right 1998   neg   BREAST BIOPSY Left 05/30/2020   stere bx x clip path pending   BREAST LUMPECTOMY WITH SENTINEL LYMPH NODE BIOPSY Left 06/29/2020   Procedure: BREAST LUMPECTOMY WITH SENTINEL LYMPH NODE BX;  Surgeon: Robert Bellow, MD;  Location: [facility] ORS;  Service: General;  Laterality: Left;   ESI     LUMBAR MICRODISCECTOMY  2019   L3-4   LUMBAR MICRODISCECTOMY  2008   L4-L5 repeated   MICRODISCECTOMY LUMBAR  2008   L4-5   MICRODISCECTOMY LUMBAR  2010   L4-L5 repeated   NODE DISSECTION Left 08/13/2020   Procedure: AXILLARY NODE DISSECTION;  Surgeon: Robert Bellow, MD;  Location: [facility] ORS;  Service: General;  Laterality: Left;   PORTACATH PLACEMENT Right 09/05/2020   Procedure: INSERTION PORT-A-CATH;   Surgeon: Robert Bellow, MD;  Location: [facility] ORS;  Service: General;  Laterality: Right;  right; monitor anesthesia care   SQUAMOUS CELL CARCINOMA EXCISION     TONSILLECTOMY  1971    Social History   Socioeconomic History   Marital status: Widowed    Spouse name: Not on file   Number of children: 1   Years of education: Not on file   Highest education level: Bachelor's degree (e.g., BA, AB, BS)  Occupational History   Occupation: retired  Tobacco Use   Smoking status: Former    Packs/day: 0.91    Years: 33.00    Pack years: 30.03    Types: Cigarettes    Quit date: 04/19/2018    Years since quitting: 2.4   Smokeless tobacco: Never  Vaping Use   Vaping Use: Never used  Substance and Sexual Activity   Alcohol use: Yes    Comment: rarely   Drug use: No   Sexual activity: Not Currently  Other Topics Concern   Not on file  Social History Narrative   Not on file   Social Determinants of Health   Financial Resource Strain: Not on file  Food Insecurity: Not on file  Transportation Needs: Not on file  Physical Activity: Not on file  Stress: Not on file  Social Connections: Not on file  Intimate Partner Violence: Not on file    Family History  Problem Relation Age of Onset   Breast cancer Mother 66   Osteosarcoma Mother        d. 38   Hypertension Brother    COPD Father    Hypertension Father    Breast cancer Paternal Aunt        dx 38s   Breast cancer Paternal Grandmother        dx 74s   Stomach cancer Paternal Grandfather        dx 47s   Breast cancer Paternal Aunt        dx 12s   Brain cancer Paternal Aunt        dx 59s     Current Outpatient Medications:    acetaminophen (TYLENOL) 650 MG CR tablet, Take 1,300 mg by mouth every 8 (eight) hours as needed for pain., Disp: , Rfl:    Biotin 1000 MCG tablet, Take 1,000 mcg by mouth daily., Disp: , Rfl:    cholecalciferol (VITAMIN D) 25 MCG (1000 UNIT) tablet, Take 1,000 Units by mouth daily., Disp: , Rfl:     diazepam (VALIUM) 5 MG tablet, Take 1 tablet (5 mg total) by mouth every 12 (twelve) hours as needed for anxiety. (Patient taking differently: Take 5 mg by mouth in the morning.), Disp: 60 tablet, Rfl: 5   hydrocortisone 2.5 % lotion, Apply 1 application topically daily as needed (when washing face)., Disp: , Rfl:    losartan (COZAAR) 100 MG tablet, TAKE 1 TABLET (100 MG TOTAL) BY MOUTH DAILY., Disp: 90 tablet, Rfl: 1   Menthol, Topical Analgesic, (BIOFREEZE EX), Apply 1 application topically daily as needed (back pain)., Disp: , Rfl:    mometasone (ELOCON) 0.1 % cream, Apply 1 application topically daily as needed (ear irritation)., Disp: , Rfl:    rosuvastatin (CRESTOR) 5 MG tablet, TAKE 1 TABLET (5 MG TOTAL) BY MOUTH DAILY., Disp: 90 tablet, Rfl: 1   triamterene-hydrochlorothiazide (DYAZIDE) 37.5-25 MG capsule, Take 1 each (1 capsule total) by mouth daily., Disp: 90 capsule, Rfl: 4   venlafaxine XR (EFFEXOR XR) 75 MG 24 hr capsule, Take 3 capsules (225 mg total) by mouth daily with breakfast., Disp: 270 capsule, Rfl: 1   lidocaine-prilocaine (EMLA) cream, Apply to affected area once, Disp: 30 g, Rfl: 3   ondansetron (ZOFRAN) 8 MG tablet, Take 1 tablet (8 mg total) by mouth 2 (two) times daily as needed (Nausea or vomiting). (Patient not taking: No sig reported), Disp: 30 tablet, Rfl: 1   Polyethyl Glycol-Propyl Glycol (SYSTANE OP), Place 1 drop into both eyes daily as needed (dry eyes). (Patient not taking: Reported on 10/15/2020), Disp: , Rfl:    prochlorperazine (COMPAZINE) 10 MG tablet, Take 1 tablet (10 mg total) by mouth every 6 (six) hours as needed (Nausea or vomiting). (Patient not taking: No sig reported), Disp: 30 tablet, Rfl: 1 No current facility-administered medications for this visit.  Facility-Administered Medications Ordered in Other Visits:    cyanocobalamin ((VITAMIN B-12)) injection 1,000 mcg, 1,000 mcg, Intramuscular, Weekly, Sindy Guadeloupe, MD, 1,000 mcg at 10/15/20 1146    heparin lock flush 100 UNIT/ML injection, , , ,    sodium chloride flush (NS) 0.9 % injection 10 mL, 10 mL, Intravenous, Once, Sindy Guadeloupe, MD  Physical exam:  Vitals:   10/15/20 1018  BP:  122/66  Pulse: 90  Resp: 18  Temp: 97.9 F (36.6 C)  SpO2: 96%  Weight: 233 lb 11.2 oz (106 kg)   Physical Exam Constitutional:      General: She is not in acute distress. Cardiovascular:     Rate and Rhythm: Normal rate and regular rhythm.     Heart sounds: Normal heart sounds.  Pulmonary:     Effort: Pulmonary effort is normal.     Breath sounds: Normal breath sounds.  Abdominal:     General: Bowel sounds are normal.     Palpations: Abdomen is soft.  Musculoskeletal:     Comments: Trace bilateral edema  Skin:    General: Skin is warm and dry.  Neurological:     Mental Status: She is alert and oriented to person, place, and time.     CMP Latest Ref Rng & Units 10/15/2020  Glucose 70 - 99 mg/dL 100(H)  BUN 8 - 23 mg/dL 34(H)  Creatinine 0.44 - 1.00 mg/dL 1.01(H)  Sodium 135 - 145 mmol/L 137  Potassium 3.5 - 5.1 mmol/L 3.4(L)  Chloride 98 - 111 mmol/L 98  CO2 22 - 32 mmol/L 30  Calcium 8.9 - 10.3 mg/dL 9.2  Total Protein 6.5 - 8.1 g/dL 6.9  Total Bilirubin 0.3 - 1.2 mg/dL 0.4  Alkaline Phos 38 - 126 U/L 124  AST 15 - 41 U/L 19  ALT 0 - 44 U/L 27   CBC Latest Ref Rng & Units 10/15/2020  WBC 4.0 - 10.5 K/uL 5.5  Hemoglobin 12.0 - 15.0 g/dL 10.5(L)  Hematocrit 36.0 - 46.0 % 32.2(L)  Platelets 150 - 400 K/uL 259     Assessment and plan- Patient is a 70 y.o. 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  She is here for on treatment assessment prior to cycle 6 of weekly Taxol chemotherapy  Counts okay to proceed with cycle 6 of weekly Taxol chemotherapy today.  She will directly proceed for cycle 7 next week and I will see her back in 2 weeks for cycle 8.  She is receiving Taxol at a reduced  dose of 65 mg per metered squared due to pre-existing neuropathy which is remained overall stable  B12 deficiency: She will receive her second weekly B12 injection today and receive 2 more weekly injections following which she will receive monthly B12 injections.  She reports improvement in her energy levels  Fatigue/shortness of breath: Likely secondary to ongoing chemotherapy/anemia.  Clinically she does not appear to be in any heart failure.  Echocardiogram to be done tomorrow   Visit Diagnosis 1. Malignant neoplasm of upper-outer quadrant of left breast in female, estrogen receptor positive ([place])   2. Encounter for antineoplastic chemotherapy   3. B12 deficiency      Dr. Randa Evens, MD, MPH [facility] at [facility] [phone] 10/15/2020 2:14 PM

## 2020-10-16 ENCOUNTER — Ambulatory Visit
Admission: RE | Admit: 2020-10-16 | Discharge: 2020-10-16 | Disposition: A | Discharge status: Home / Self Care | Payer: Medicare PPO | Source: Ambulatory Visit | Attending: Oncology | Admitting: Oncology

## 2020-10-16 ENCOUNTER — Other Ambulatory Visit: Payer: Self-pay | Admitting: *Deleted

## 2020-10-16 ENCOUNTER — Other Ambulatory Visit: Payer: Self-pay

## 2020-10-16 DIAGNOSIS — C50412 Malignant neoplasm of upper-outer quadrant of left female breast: Secondary | ICD-10-CM | POA: Diagnosis not present

## 2020-10-16 DIAGNOSIS — Z170 Estrogen receptor positive status [ER+]: Secondary | ICD-10-CM | POA: Diagnosis not present

## 2020-10-16 DIAGNOSIS — Z01818 Encounter for other preprocedural examination: Secondary | ICD-10-CM | POA: Insufficient documentation

## 2020-10-16 DIAGNOSIS — Z0189 Encounter for other specified special examinations: Secondary | ICD-10-CM | POA: Diagnosis not present

## 2020-10-16 DIAGNOSIS — I493 Ventricular premature depolarization: Secondary | ICD-10-CM | POA: Diagnosis not present

## 2020-10-16 DIAGNOSIS — I10 Essential (primary) hypertension: Secondary | ICD-10-CM | POA: Diagnosis not present

## 2020-10-16 LAB — ECHOCARDIOGRAM COMPLETE
AR max vel: 1.87 cm2
AV Area VTI: 1.95 cm2
AV Area mean vel: 1.92 cm2
AV Mean grad: 5 mmHg
AV Peak grad: 9 mmHg
Ao pk vel: 1.5 m/s
Area-P 1/2: 3.27 cm2
MV VTI: 1.78 cm2
S' Lateral: 2.42 cm

## 2020-10-16 MED ORDER — PERFLUTREN LIPID MICROSPHERE
1.0000 mL | INTRAVENOUS | Status: AC | PRN
  Administered 2020-10-16: 2 mL via INTRAVENOUS
  Filled 2020-10-16: qty 10

## 2020-10-16 MED ORDER — HEPARIN SOD (PORK) LOCK FLUSH 100 UNIT/ML IV SOLN
INTRAVENOUS | Status: AC
  Filled 2020-10-16: qty 5

## 2020-10-16 NOTE — Progress Notes (Signed)
*  PRELIMINARY RESULTS* Echocardiogram 2D Echocardiogram has been performed.  Sherrie Sport 10/16/2020, 11:56 AM

## 2020-10-22 ENCOUNTER — Other Ambulatory Visit: Payer: Self-pay

## 2020-10-22 ENCOUNTER — Other Ambulatory Visit: Payer: Self-pay | Admitting: *Deleted

## 2020-10-22 ENCOUNTER — Ambulatory Visit
Admission: RE | Admit: 2020-10-22 | Discharge: 2020-10-22 | Disposition: A | Discharge status: Home / Self Care | Payer: Medicare PPO | Source: Ambulatory Visit | Attending: Oncology | Admitting: Oncology

## 2020-10-22 ENCOUNTER — Inpatient Hospital Stay: Payer: Medicare PPO

## 2020-10-22 VITALS — BP 120/64 | HR 95 | Temp 98.0°F | Resp 22

## 2020-10-22 DIAGNOSIS — Z170 Estrogen receptor positive status [ER+]: Secondary | ICD-10-CM | POA: Insufficient documentation

## 2020-10-22 DIAGNOSIS — E538 Deficiency of other specified B group vitamins: Secondary | ICD-10-CM | POA: Diagnosis not present

## 2020-10-22 DIAGNOSIS — R0600 Dyspnea, unspecified: Secondary | ICD-10-CM

## 2020-10-22 DIAGNOSIS — R0602 Shortness of breath: Secondary | ICD-10-CM | POA: Diagnosis not present

## 2020-10-22 DIAGNOSIS — Z5111 Encounter for antineoplastic chemotherapy: Secondary | ICD-10-CM

## 2020-10-22 DIAGNOSIS — Z79899 Other long term (current) drug therapy: Secondary | ICD-10-CM | POA: Diagnosis not present

## 2020-10-22 DIAGNOSIS — C50412 Malignant neoplasm of upper-outer quadrant of left female breast: Secondary | ICD-10-CM

## 2020-10-22 DIAGNOSIS — C773 Secondary and unspecified malignant neoplasm of axilla and upper limb lymph nodes: Secondary | ICD-10-CM | POA: Diagnosis not present

## 2020-10-22 DIAGNOSIS — Z853 Personal history of malignant neoplasm of breast: Secondary | ICD-10-CM | POA: Diagnosis not present

## 2020-10-22 LAB — CBC WITH DIFFERENTIAL/PLATELET
Abs Immature Granulocytes: 0.09 10*3/uL — ABNORMAL HIGH (ref 0.00–0.07)
Basophils Absolute: 0.1 10*3/uL (ref 0.0–0.1)
Basophils Relative: 1 %
Eosinophils Absolute: 0.1 10*3/uL (ref 0.0–0.5)
Eosinophils Relative: 1 %
HCT: 30.7 % — ABNORMAL LOW (ref 36.0–46.0)
Hemoglobin: 10 g/dL — ABNORMAL LOW (ref 12.0–15.0)
Immature Granulocytes: 1 %
Lymphocytes Relative: 28 %
Lymphs Abs: 2 10*3/uL (ref 0.7–4.0)
MCH: 30.2 pg (ref 26.0–34.0)
MCHC: 32.6 g/dL (ref 30.0–36.0)
MCV: 92.7 fL (ref 80.0–100.0)
Monocytes Absolute: 0.7 10*3/uL (ref 0.1–1.0)
Monocytes Relative: 9 %
Neutro Abs: 4.3 10*3/uL (ref 1.7–7.7)
Neutrophils Relative %: 60 %
Platelets: 343 10*3/uL (ref 150–400)
RBC: 3.31 MIL/uL — ABNORMAL LOW (ref 3.87–5.11)
RDW: 16.1 % — ABNORMAL HIGH (ref 11.5–15.5)
WBC: 7.2 10*3/uL (ref 4.0–10.5)
nRBC: 0.7 % — ABNORMAL HIGH (ref 0.0–0.2)

## 2020-10-22 LAB — COMPREHENSIVE METABOLIC PANEL
ALT: 25 U/L (ref 0–44)
AST: 22 U/L (ref 15–41)
Albumin: 3.6 g/dL (ref 3.5–5.0)
Alkaline Phosphatase: 117 U/L (ref 38–126)
Anion gap: 11 (ref 5–15)
BUN: 25 mg/dL — ABNORMAL HIGH (ref 8–23)
CO2: 27 mmol/L (ref 22–32)
Calcium: 8.7 mg/dL — ABNORMAL LOW (ref 8.9–10.3)
Chloride: 100 mmol/L (ref 98–111)
Creatinine, Ser: 1.36 mg/dL — ABNORMAL HIGH (ref 0.44–1.00)
GFR, Estimated: 42 mL/min — ABNORMAL LOW (ref >60)
Glucose, Bld: 106 mg/dL — ABNORMAL HIGH (ref 70–99)
Potassium: 3.3 mmol/L — ABNORMAL LOW (ref 3.5–5.1)
Sodium: 138 mmol/L (ref 135–145)
Total Bilirubin: 0.5 mg/dL (ref 0.3–1.2)
Total Protein: 6.7 g/dL (ref 6.5–8.1)

## 2020-10-22 LAB — TSH: TSH: 0.682 u[IU]/mL (ref 0.350–4.500)

## 2020-10-22 MED ORDER — FAMOTIDINE 20 MG IN NS 100 ML IVPB
20.0000 mg | Freq: Once | INTRAVENOUS | Status: AC
  Administered 2020-10-22: 20 mg via INTRAVENOUS
  Filled 2020-10-22: qty 20

## 2020-10-22 MED ORDER — SODIUM CHLORIDE 0.9 % IV SOLN
Freq: Once | INTRAVENOUS | Status: AC
  Filled 2020-10-22: qty 250

## 2020-10-22 MED ORDER — SODIUM CHLORIDE 0.9 % IV SOLN
65.0000 mg/m2 | Freq: Once | INTRAVENOUS | Status: AC
  Administered 2020-10-22: 144 mg via INTRAVENOUS
  Filled 2020-10-22 (×2): qty 24

## 2020-10-22 MED ORDER — HEPARIN SOD (PORK) LOCK FLUSH 100 UNIT/ML IV SOLN
500.0000 [IU] | Freq: Once | INTRAVENOUS | Status: AC | PRN
  Filled 2020-10-22: qty 5

## 2020-10-22 MED ORDER — SODIUM CHLORIDE 0.9 % IV SOLN
10.0000 mg | Freq: Once | INTRAVENOUS | Status: AC
  Administered 2020-10-22: 10 mg via INTRAVENOUS
  Filled 2020-10-22: qty 10

## 2020-10-22 MED ORDER — DIPHENHYDRAMINE HCL 50 MG/ML IJ SOLN
50.0000 mg | Freq: Once | INTRAMUSCULAR | Status: AC
  Administered 2020-10-22: 50 mg via INTRAVENOUS
  Filled 2020-10-22: qty 1

## 2020-10-22 MED ORDER — HEPARIN SOD (PORK) LOCK FLUSH 100 UNIT/ML IV SOLN
INTRAVENOUS | Status: AC
  Administered 2020-10-22: 500 [IU]
  Filled 2020-10-22: qty 5

## 2020-10-22 MED ORDER — IOHEXOL 350 MG/ML SOLN
75.0000 mL | Freq: Once | INTRAVENOUS | Status: AC | PRN
  Administered 2020-10-22: 60 mL via INTRAVENOUS

## 2020-10-22 MED ORDER — CYANOCOBALAMIN 1000 MCG/ML IJ SOLN
1000.0000 ug | INTRAMUSCULAR | Status: DC
  Administered 2020-10-22: 1000 ug via INTRAMUSCULAR
  Filled 2020-10-22: qty 1

## 2020-10-22 NOTE — Progress Notes (Signed)
Patient c/o SOB starting this AM. Reports SOB is intermittent and Dr. Janese Banks is aware of her episodes but states this is the worst episode thus far. VS Checked - See Flowsheet. Patient denies any chest pian but reports runny nose with small blood clots. Dr. Janese Banks made aware. Per Dr. Janese Banks, okay to proceed with treatment. Patient is to have CT scan scheduled to r/o PE.

## 2020-10-22 NOTE — Patient Instructions (Signed)
[facility] [place] [facility]  Discharge Instructions: Thank you for choosing [facility] to provide your oncology and hematology care.  If you have a lab appointment with the Cancer Center, please go directly to the Cancer Center and check in at the registration area.  Wear comfortable clothing and clothing appropriate for easy access to any Portacath or PICC line.   We strive to give you quality time with your provider. You may need to reschedule your appointment if you arrive late (15 or more minutes).  Arriving late affects you and other patients whose appointments are after yours.  Also, if you miss three or more appointments without notifying the office, you may be dismissed from the clinic at the provider's discretion.      For prescription refill requests, have your pharmacy contact our office and allow 72 hours for refills to be completed.    Today you received the following chemotherapy and/or immunotherapy agents Taxol        To help prevent nausea and vomiting after your treatment, we encourage you to take your nausea medication as directed.  BELOW ARE SYMPTOMS THAT SHOULD BE REPORTED IMMEDIATELY: *FEVER GREATER THAN 100.4 F (38 C) OR HIGHER *CHILLS OR SWEATING *NAUSEA AND VOMITING THAT IS NOT CONTROLLED WITH YOUR NAUSEA MEDICATION *UNUSUAL SHORTNESS OF BREATH *UNUSUAL BRUISING OR BLEEDING *URINARY PROBLEMS (pain or burning when urinating, or frequent urination) *BOWEL PROBLEMS (unusual diarrhea, constipation, pain near the anus) TENDERNESS IN MOUTH AND THROAT WITH OR WITHOUT PRESENCE OF ULCERS (sore throat, sores in mouth, or a toothache) UNUSUAL RASH, SWELLING OR PAIN  UNUSUAL VAGINAL DISCHARGE OR ITCHING   Items with * indicate a potential emergency and should be followed up as soon as possible or go to the Emergency Department if any problems should occur.  Please show the CHEMOTHERAPY ALERT CARD or IMMUNOTHERAPY ALERT CARD at check-in to  the Emergency Department and triage nurse.  Should you have questions after your visit or need to cancel or reschedule your appointment, please contact [facility] [place] [facility]  [phone] and follow the prompts.  Office hours are 8:00 a.m. to 4:30 p.m. Monday - Friday. Please note that voicemails left after 4:00 p.m. may not be returned until the following business day.  We are closed weekends and major holidays. You have access to a nurse at all times for urgent questions. Please call the main number to the clinic [phone] and follow the prompts.  For any non-urgent questions, you may also contact your provider using MyChart. We now offer e-Visits for anyone 18 and older to request care online for non-urgent symptoms. For details visit [email or web address].   Also download the MyChart app! Go to the app store, search "MyChart", open the app, select [place], and log in with your MyChart username and password.  Due to Covid, a mask is required upon entering the hospital/clinic. If you do not have a mask, one will be given to you upon arrival. For doctor visits, patients may have 1 support person aged 18 or older with them. For treatment visits, patients cannot have anyone with them due to current Covid guidelines and our immunocompromised population.  

## 2020-10-29 ENCOUNTER — Encounter: Payer: Self-pay | Admitting: Oncology

## 2020-10-29 ENCOUNTER — Ambulatory Visit: Payer: Medicare PPO | Admitting: Oncology

## 2020-10-29 ENCOUNTER — Inpatient Hospital Stay: Payer: Medicare PPO

## 2020-10-29 ENCOUNTER — Other Ambulatory Visit: Payer: Medicare PPO

## 2020-10-29 ENCOUNTER — Inpatient Hospital Stay: Payer: Medicare PPO | Admitting: Oncology

## 2020-10-29 ENCOUNTER — Ambulatory Visit: Payer: Medicare PPO

## 2020-10-29 VITALS — BP 126/66 | HR 96 | Temp 98.8°F | Resp 17 | Wt 235.0 lb

## 2020-10-29 DIAGNOSIS — C773 Secondary and unspecified malignant neoplasm of axilla and upper limb lymph nodes: Secondary | ICD-10-CM | POA: Diagnosis not present

## 2020-10-29 DIAGNOSIS — Z5111 Encounter for antineoplastic chemotherapy: Secondary | ICD-10-CM

## 2020-10-29 DIAGNOSIS — C50412 Malignant neoplasm of upper-outer quadrant of left female breast: Secondary | ICD-10-CM

## 2020-10-29 DIAGNOSIS — Z170 Estrogen receptor positive status [ER+]: Secondary | ICD-10-CM | POA: Diagnosis not present

## 2020-10-29 DIAGNOSIS — Z79899 Other long term (current) drug therapy: Secondary | ICD-10-CM | POA: Diagnosis not present

## 2020-10-29 DIAGNOSIS — E538 Deficiency of other specified B group vitamins: Secondary | ICD-10-CM

## 2020-10-29 LAB — COMPREHENSIVE METABOLIC PANEL
ALT: 28 U/L (ref 0–44)
AST: 23 U/L (ref 15–41)
Albumin: 3.8 g/dL (ref 3.5–5.0)
Alkaline Phosphatase: 113 U/L (ref 38–126)
Anion gap: 7 (ref 5–15)
BUN: 25 mg/dL — ABNORMAL HIGH (ref 8–23)
CO2: 30 mmol/L (ref 22–32)
Calcium: 9 mg/dL (ref 8.9–10.3)
Chloride: 100 mmol/L (ref 98–111)
Creatinine, Ser: 0.99 mg/dL (ref 0.44–1.00)
GFR, Estimated: >60 mL/min (ref >60)
Glucose, Bld: 105 mg/dL — ABNORMAL HIGH (ref 70–99)
Potassium: 3.4 mmol/L — ABNORMAL LOW (ref 3.5–5.1)
Sodium: 137 mmol/L (ref 135–145)
Total Bilirubin: 0.4 mg/dL (ref 0.3–1.2)
Total Protein: 6.5 g/dL (ref 6.5–8.1)

## 2020-10-29 LAB — CBC WITH DIFFERENTIAL/PLATELET
Abs Immature Granulocytes: 0.03 10*3/uL (ref 0.00–0.07)
Basophils Absolute: 0 10*3/uL (ref 0.0–0.1)
Basophils Relative: 1 %
Eosinophils Absolute: 0.1 10*3/uL (ref 0.0–0.5)
Eosinophils Relative: 1 %
HCT: 31.2 % — ABNORMAL LOW (ref 36.0–46.0)
Hemoglobin: 10.1 g/dL — ABNORMAL LOW (ref 12.0–15.0)
Immature Granulocytes: 1 %
Lymphocytes Relative: 42 %
Lymphs Abs: 2 10*3/uL (ref 0.7–4.0)
MCH: 30.2 pg (ref 26.0–34.0)
MCHC: 32.4 g/dL (ref 30.0–36.0)
MCV: 93.4 fL (ref 80.0–100.0)
Monocytes Absolute: 0.4 10*3/uL (ref 0.1–1.0)
Monocytes Relative: 9 %
Neutro Abs: 2.3 10*3/uL (ref 1.7–7.7)
Neutrophils Relative %: 46 %
Platelets: 282 10*3/uL (ref 150–400)
RBC: 3.34 MIL/uL — ABNORMAL LOW (ref 3.87–5.11)
RDW: 16.3 % — ABNORMAL HIGH (ref 11.5–15.5)
WBC: 4.9 10*3/uL (ref 4.0–10.5)
nRBC: 0 % (ref 0.0–0.2)

## 2020-10-29 MED ORDER — SODIUM CHLORIDE 0.9 % IV SOLN
65.0000 mg/m2 | Freq: Once | INTRAVENOUS | Status: AC
  Administered 2020-10-29: 144 mg via INTRAVENOUS
  Filled 2020-10-29: qty 24

## 2020-10-29 MED ORDER — HEPARIN SOD (PORK) LOCK FLUSH 100 UNIT/ML IV SOLN
500.0000 [IU] | Freq: Once | INTRAVENOUS | Status: AC | PRN
  Filled 2020-10-29: qty 5

## 2020-10-29 MED ORDER — FAMOTIDINE 20 MG IN NS 100 ML IVPB
20.0000 mg | Freq: Once | INTRAVENOUS | Status: AC
  Administered 2020-10-29: 20 mg via INTRAVENOUS
  Filled 2020-10-29: qty 20

## 2020-10-29 MED ORDER — SODIUM CHLORIDE 0.9 % IV SOLN
10.0000 mg | Freq: Once | INTRAVENOUS | Status: AC
  Administered 2020-10-29: 10 mg via INTRAVENOUS
  Filled 2020-10-29: qty 10

## 2020-10-29 MED ORDER — DIPHENHYDRAMINE HCL 50 MG/ML IJ SOLN
50.0000 mg | Freq: Once | INTRAMUSCULAR | Status: AC
  Administered 2020-10-29: 50 mg via INTRAVENOUS
  Filled 2020-10-29: qty 1

## 2020-10-29 MED ORDER — CYANOCOBALAMIN 1000 MCG/ML IJ SOLN
1000.0000 ug | INTRAMUSCULAR | Status: DC
  Administered 2020-10-29: 1000 ug via INTRAMUSCULAR
  Filled 2020-10-29: qty 1

## 2020-10-29 MED ORDER — HEPARIN SOD (PORK) LOCK FLUSH 100 UNIT/ML IV SOLN
INTRAVENOUS | Status: AC
  Administered 2020-10-29: 500 [IU]
  Filled 2020-10-29: qty 5

## 2020-10-29 MED ORDER — SODIUM CHLORIDE 0.9 % IV SOLN
Freq: Once | INTRAVENOUS | Status: AC
  Filled 2020-10-29: qty 250

## 2020-10-29 NOTE — Progress Notes (Signed)
Patient here for oncology follow-up appointment,  concerns of occasional chest pain, SOB and skin lesions

## 2020-10-29 NOTE — Progress Notes (Signed)
Hematology/Oncology Consult note [facility]  Telephone:([phone] Fax:([phone]  Patient Care Team: Maple Hudson., MD as PCP - General (Family Medicine) Irene Limbo., MD as Consulting Physician (Ophthalmology) Dimmig, Maisie Fus, MD as Referring Physician (Orthopedic Surgery) Debbrah Alar, MD (Dermatology) Dasher, Cliffton Asters, MD (Dermatology) Creig Hines, MD as Consulting Physician (Hematology and Oncology)   Name of the patient: Victoria Dean  [id number]  1950-02-05   Date of visit: 10/29/20  Diagnosis-  pathological prognostic stage Ib invasive lobular carcinoma of the left breast [id number] ER/PR positive HER2 negative  Chief complaint/ Reason for visit-on treatment assessment prior to cycle 8 of weekly Taxol chemotherapy  Heme/Onc history: Patient is a 70 year old female with a past medical history significant for claustrophobia, hypertension hypercholesterolemia, depression among other medical problems.  Patient began to notice retraction of her left nipple.  This was followed by a diagnostic mammogram which showed vague shadowing in the upper outer quadrant of the left breast without a definitive mass.  Benign cysts in the lower left breast measuring 1.5 cm.  No evidence of malignancy in the right breast.  No evidence of abnormal left axillary lymph nodes.  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   Bilateral breast MRI showed 7 x 6 x 4 cm area of non-mass enhancement in the upper outer left breast at 1 o'clock position.  This involves anterior and middle third of the breast and extension into the posterior third.  2 inferior left axillary lymph nodes with mild cortical thickening.   PET CT scan showed 1.7 cm hypermetabolic nodule in the midline of the  neck which could represent thyroglossal cyst but malignancy cannot be excluded. Patient underwent left-sided lumpectomy with nipple excision.     Final pathology showed invasive lobular carcinoma grade 210.5 cm with associated DCIS.  Skin/nipple are present and involved.  Invasive carcinoma invades the nipple without skin ulceration.  Lymphovascular invasion present.  Margins negative.  3 out of 4 lymph nodes positive for malignancy 11 mm largest deposit with extranodal extension.  pT3 pN1 a cM0   Patient was taken back for axillary dissection given 3 lymph nodes positive with extranodal extension and 4 more lymph nodes out of 9 were positive with extranodal extension   Given cardiovascular comorbidities and patient age none anthracycline adjuvant chemotherapy options were discussed.  Patient did not wish to proceed with TC chemotherapy and has decided with weekly Taxol chemotherapy  Interval history-still reports exertional shortness of breath which has remained unchanged.  He is not requiring any oxygen.  Has minimal neuropathy in her hands and feet no worse than before chemotherapy.  Reports rash in her bilateral forearms which is occasionally pruritic but otherwise does not bother her.  ECOG PS- 2 Pain scale- 0 Opioid associated constipation- no  Review of systems- Review of Systems  Constitutional:  Positive for malaise/fatigue. Negative for chills, fever and weight loss.  HENT:  Negative for congestion, ear discharge and nosebleeds.   Eyes:  Negative for blurred vision.  Respiratory:  Positive for shortness of breath. Negative for cough, hemoptysis, sputum production and wheezing.   Cardiovascular:  Negative for chest pain, palpitations, orthopnea and claudication.  Gastrointestinal:  Negative for abdominal pain, blood in stool, constipation, diarrhea, heartburn, melena, nausea and vomiting.  Genitourinary:  Negative  for dysuria, flank pain, frequency, hematuria and urgency.   Musculoskeletal:  Negative for back pain, joint pain and myalgias.  Skin:  Negative for rash.  Neurological:  Negative for dizziness, tingling, focal weakness, seizures, weakness and headaches.  Endo/Heme/Allergies:  Does not bruise/bleed easily.  Psychiatric/Behavioral:  Negative for depression and suicidal ideas. The patient does not have insomnia.      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    Vitamin D deficiency      Past Surgical History:  Procedure Laterality Date   ABDOMINAL HYSTERECTOMY  1995   APPENDECTOMY  1994   BREAST BIOPSY Right 1998   neg   BREAST BIOPSY Left 05/30/2020   stere bx x clip path pending   BREAST LUMPECTOMY WITH SENTINEL LYMPH NODE BIOPSY Left 06/29/2020   Procedure: BREAST LUMPECTOMY WITH SENTINEL LYMPH NODE BX;  Surgeon: Robert Bellow, MD;  Location: [facility] ORS;  Service: General;  Laterality: Left;   ESI     LUMBAR MICRODISCECTOMY  2019   L3-4   LUMBAR MICRODISCECTOMY  2008   L4-L5 repeated   MICRODISCECTOMY LUMBAR  2008   L4-5   MICRODISCECTOMY LUMBAR  2010   L4-L5 repeated   NODE DISSECTION Left 08/13/2020   Procedure: AXILLARY NODE DISSECTION;  Surgeon: Robert Bellow, MD;  Location: [facility] ORS;  Service: General;  Laterality: Left;   PORTACATH PLACEMENT Right 09/05/2020   Procedure: INSERTION PORT-A-CATH;  Surgeon:  Robert Bellow, MD;  Location: [facility] ORS;  Service: General;  Laterality: Right;  right; monitor anesthesia care   SQUAMOUS CELL CARCINOMA EXCISION     TONSILLECTOMY  1971    Social History   Socioeconomic History   Marital status: Widowed    Spouse name: Not on file   Number of children: 1   Years of education: Not on file   Highest education level: Bachelor's degree (e.g., BA, AB, BS)  Occupational History   Occupation: retired  Tobacco Use   Smoking status: Former    Packs/day: 0.91    Years: 33.00    Pack years: 30.03    Types: Cigarettes    Quit date: 04/19/2018    Years since quitting: 2.5   Smokeless tobacco: Never  Vaping Use   Vaping Use: Never used  Substance and Sexual Activity   Alcohol use: Yes    Comment: rarely   Drug use: No   Sexual activity: Not Currently  Other Topics Concern   Not on file  Social History Narrative   Not on file   Social Determinants of Health   Financial Resource Strain: Not on file  Food Insecurity: Not on file  Transportation Needs: Not on  file  Physical Activity: Not on file  Stress: Not on file  Social Connections: Not on file  Intimate Partner Violence: Not on file    Family History  Problem Relation Age of Onset   Breast cancer Mother 11   Osteosarcoma Mother        d. 58   Hypertension Brother    COPD Father    Hypertension Father    Breast cancer Paternal Aunt        dx 55s   Breast cancer Paternal Grandmother        dx 59s   Stomach cancer Paternal Grandfather        dx 27s   Breast cancer Paternal Aunt        dx 36s   Brain cancer Paternal Aunt        dx 59s     Current Outpatient Medications:    acetaminophen (TYLENOL) 650 MG CR tablet, Take 1,300 mg by mouth every 8 (eight) hours as needed for pain., Disp: , Rfl:    Biotin 1000 MCG tablet, Take 1,000 mcg by mouth daily., Disp: , Rfl:    cholecalciferol (VITAMIN D) 25 MCG (1000 UNIT) tablet, Take 1,000 Units by mouth daily., Disp: , Rfl:     diazepam (VALIUM) 5 MG tablet, Take 1 tablet (5 mg total) by mouth every 12 (twelve) hours as needed for anxiety. (Patient taking differently: Take 5 mg by mouth in the morning.), Disp: 60 tablet, Rfl: 5   hydrocortisone 2.5 % lotion, Apply 1 application topically daily as needed (when washing face)., Disp: , Rfl:    lidocaine-prilocaine (EMLA) cream, Apply to affected area once, Disp: 30 g, Rfl: 3   losartan (COZAAR) 100 MG tablet, TAKE 1 TABLET (100 MG TOTAL) BY MOUTH DAILY., Disp: 90 tablet, Rfl: 1   Menthol, Topical Analgesic, (BIOFREEZE EX), Apply 1 application topically daily as needed (back pain)., Disp: , Rfl:    mometasone (ELOCON) 0.1 % cream, Apply 1 application topically daily as needed (ear irritation)., Disp: , Rfl:    ondansetron (ZOFRAN) 8 MG tablet, Take 1 tablet (8 mg total) by mouth 2 (two) times daily as needed (Nausea or vomiting). (Patient not taking: No sig reported), Disp: 30 tablet, Rfl: 1   Polyethyl Glycol-Propyl Glycol (SYSTANE OP), Place 1 drop into both eyes daily as needed (dry eyes). (Patient not taking: Reported on 10/15/2020), Disp: , Rfl:    prochlorperazine (COMPAZINE) 10 MG tablet, Take 1 tablet (10 mg total) by mouth every 6 (six) hours as needed (Nausea or vomiting). (Patient not taking: No sig reported), Disp: 30 tablet, Rfl: 1   rosuvastatin (CRESTOR) 5 MG tablet, TAKE 1 TABLET (5 MG TOTAL) BY MOUTH DAILY., Disp: 90 tablet, Rfl: 1   triamterene-hydrochlorothiazide (DYAZIDE) 37.5-25 MG capsule, Take 1 each (1 capsule total) by mouth daily., Disp: 90 capsule, Rfl: 4   venlafaxine XR (EFFEXOR XR) 75 MG 24 hr capsule, Take 3 capsules (225 mg total) by mouth daily with breakfast., Disp: 270 capsule, Rfl: 1  Physical exam:  Vitals:   10/29/20 0924  BP: 126/66  Pulse: 96  Resp: 17  Temp: 98.8 F (37.1 C)  TempSrc: Tympanic  SpO2: 97%  Weight: 235 lb (106.6 kg)   Physical Exam Constitutional:      Comments: Ambulates with a cane.  Appears in no acute  distress  Cardiovascular:     Rate and Rhythm: Normal rate and regular rhythm.     Heart sounds: Normal heart sounds.  Pulmonary:  Effort: Pulmonary effort is normal.     Breath sounds: Normal breath sounds.  Abdominal:     General: Bowel sounds are normal.     Palpations: Abdomen is soft.  Skin:    General: Skin is warm and dry.  Neurological:     Mental Status: She is alert and oriented to person, place, and time.     CMP Latest Ref Rng & Units 10/22/2020  Glucose 70 - 99 mg/dL 106(H)  BUN 8 - 23 mg/dL 25(H)  Creatinine 0.44 - 1.00 mg/dL 1.36(H)  Sodium 135 - 145 mmol/L 138  Potassium 3.5 - 5.1 mmol/L 3.3(L)  Chloride 98 - 111 mmol/L 100  CO2 22 - 32 mmol/L 27  Calcium 8.9 - 10.3 mg/dL 8.7(L)  Total Protein 6.5 - 8.1 g/dL 6.7  Total Bilirubin 0.3 - 1.2 mg/dL 0.5  Alkaline Phos 38 - 126 U/L 117  AST 15 - 41 U/L 22  ALT 0 - 44 U/L 25   CBC Latest Ref Rng & Units 10/22/2020  WBC 4.0 - 10.5 K/uL 7.2  Hemoglobin 12.0 - 15.0 g/dL 10.0(L)  Hematocrit 36.0 - 46.0 % 30.7(L)  Platelets 150 - 400 K/uL 343    No images are attached to the encounter.  CT Angio Chest Pulmonary Embolism (PE) W or WO Contrast  Result Date: 10/22/2020 CLINICAL DATA:  Progressive shortness of breath. History of breast cancer. EXAM: CT ANGIOGRAPHY CHEST WITH CONTRAST TECHNIQUE: Multidetector CT imaging of the chest was performed using the standard protocol during bolus administration of intravenous contrast. Multiplanar CT image reconstructions and MIPs were obtained to evaluate the vascular anatomy. CONTRAST:  46mL OMNIPAQUE IOHEXOL 350 MG/ML SOLN COMPARISON:  PET-CT 07/25/2020 FINDINGS: Cardiovascular: Satisfactory opacification of the pulmonary arteries to the segmental level. No evidence of pulmonary embolism. Normal heart size. No pericardial effusion. Atherosclerotic calcifications present along the thoracic aorta and coronary arteries. Right IJ approach single-lumen power injectable port catheter.  Catheter tip terminates in the distal SVC. Mediastinum/Nodes: No enlarged mediastinal, hilar, or axillary lymph nodes. Thyroid gland, trachea, and esophagus demonstrate no significant findings. Lungs/Pleura: Lungs are clear. No pleural effusion or pneumothorax. Upper Abdomen: No acute abnormality. Hypoattenuation of the hepatic parenchyma with sparing around the gallbladder fossa consistent with steatosis. Musculoskeletal: No acute fracture or aggressive appearing lytic or blastic osseous lesion. Surgical changes visible in the left breast consistent with prior lumpectomy and axillary node sampling. Review of the MIP images confirms the above findings. IMPRESSION: 1. Negative for pulmonary embolus, pneumonia or other acute cardiopulmonary process. 2. Coronary artery and aortic atherosclerotic calcifications. 3. Hepatic steatosis. Aortic Atherosclerosis (ICD10-I70.0). Electronically Signed   By: Jacqulynn Cadet M.D.   On: 10/22/2020 15:53   ECHOCARDIOGRAM COMPLETE  Result Date: 10/16/2020    ECHOCARDIOGRAM REPORT   Patient Name:   Victoria Dean Date of Exam: 10/16/2020 Medical Rec #:  [id number]    Height:       63.0 in Accession #:    [id number]   Weight:       233.7 lb Date of Birth:  April 11, 1950   BSA:          2.066 m Patient Age:    4 years     BP:           122/66 mmHg Patient Gender: F            HR:           90 bpm. Exam Location:  [facility] Procedure: 2D Echo, Cardiac Doppler, Color  Doppler, Strain Analysis and            Intracardiac Opacification Agent Indications:     Chemo Z09  History:         Patient has no prior history of Echocardiogram examinations.                  Risk Factors:Hypertension. PVC.  Sonographer:     Sherrie Sport Referring Phys:  [id number] [facility] C Mayte Diers Diagnosing Phys: Harrell Gave End MD  Sonographer Comments: No parasternal window, no subcostal window, Technically challenging study due to limited acoustic windows and suboptimal apical window. Global longitudinal strain was attempted.  IMPRESSIONS  1. Left ventricular ejection fraction, by estimation, is 60 to 65%. The left ventricle has normal function. Left ventricular endocardial border not optimally defined to evaluate regional wall motion. Left ventricular diastolic parameters are indeterminate.  2. Right ventricular systolic function is normal. The right ventricular size is normal. Tricuspid regurgitation signal is inadequate for assessing PA pressure.  3. The mitral valve was not well visualized. Trivial mitral valve regurgitation. No evidence of mitral stenosis. Moderate mitral annular calcification.  4. The aortic valve was not well visualized. Aortic valve regurgitation is not visualized. No aortic stenosis is present. FINDINGS  Left Ventricle: Left ventricular ejection fraction, by estimation, is 60 to 65%. The left ventricle has normal function. Left ventricular endocardial border not optimally defined to evaluate regional wall motion. Definity contrast agent was given IV to delineate the left ventricular endocardial borders. Global longitudinal strain performed but not reported based on interpreter judgement due to suboptimal tracking. The left ventricular internal cavity size was normal in size. Left ventricular diastolic parameters are indeterminate. Right Ventricle: The right ventricular size is normal. Right vetricular wall thickness was not well visualized. Right ventricular systolic function is normal. Tricuspid regurgitation signal is inadequate for assessing PA pressure. Left Atrium: Left atrial size was normal in size. Right Atrium: Right atrial size was normal in size. Pericardium: The pericardium was not well visualized. Mitral Valve: The mitral valve was not well visualized. Moderate mitral annular calcification. Trivial mitral valve regurgitation. No evidence of mitral valve stenosis. MV peak gradient, 8.2 mmHg. The mean mitral valve gradient is 4.0 mmHg. Tricuspid Valve: The tricuspid valve is not well visualized.  Tricuspid valve regurgitation is trivial. Aortic Valve: The aortic valve was not well visualized. Aortic valve regurgitation is not visualized. No aortic stenosis is present. Aortic valve mean gradient measures 5.0 mmHg. Aortic valve peak gradient measures 9.0 mmHg. Aortic valve area, by VTI measures 1.95 cm. Pulmonic Valve: The pulmonic valve was not well visualized. Aorta: The ascending aorta was not well visualized. IAS/Shunts: The interatrial septum was not well visualized.  LEFT VENTRICLE PLAX 2D LVIDd:         3.30 cm LVIDs:         2.42 cm LV PW:         0.67 cm LV IVS:        1.19 cm LVOT diam:     2.00 cm LV SV:         53 LV SV Index:   26 LVOT Area:     3.14 cm  LEFT ATRIUM             Index       RIGHT ATRIUM          Index LA diam:        3.20 cm 1.55 cm/m  RA Area:     9.64  cm LA Vol (A2C):   43.9 ml 21.25 ml/m RA Volume:   18.30 ml 8.86 ml/m LA Vol (A4C):   31.9 ml 15.44 ml/m LA Biplane Vol: 37.9 ml 18.35 ml/m  AORTIC VALVE AV Area (Vmax):    1.87 cm AV Area (Vmean):   1.92 cm AV Area (VTI):     1.95 cm AV Vmax:           150.33 cm/s AV Vmean:          101.733 cm/s AV VTI:            0.272 m AV Peak Grad:      9.0 mmHg AV Mean Grad:      5.0 mmHg LVOT Vmax:         89.40 cm/s LVOT Vmean:        62.100 cm/s LVOT VTI:          0.169 m LVOT/AV VTI ratio: 0.62 MITRAL VALVE MV Area (PHT): 3.27 cm     SHUNTS MV Area VTI:   1.78 cm     Systemic VTI:  0.17 m MV Peak grad:  8.2 mmHg     Systemic Diam: 2.00 cm MV Mean grad:  4.0 mmHg MV Vmax:       1.43 m/s MV Vmean:      93.3 cm/s MV Decel Time: 232 msec MV E velocity: 91.70 cm/s MV A velocity: 123.00 cm/s MV E/A ratio:  0.75 Harrell Gave End MD Electronically signed by Nelva Bush MD Signature Date/Time: 10/16/2020/4:35:39 PM    Final      Assessment and plan- Patient is a 70 y.o. 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  She is here for on  treatment assessment prior to cycle 8 of weekly Taxol chemotherapy  Counts we will proceed with cycle 8 of weekly Taxol chemotherapy today and she will directly proceed for cycle 9 next week and I will see him back in 2 weeks for cycle 10.  We will refer her to radiation oncology at this time for consideration of adjuvant radiation.  She completes chemotherapy in about 4 weeks time  B12 deficiency: Patient has received 3 weekly B12 injections and will receive her fourth B12 injection today following which she will receive B12 on a monthly basis.  Dyspnea/shortness of breath:CT angio chest was done recently which ruled out PE.  It was also negative for pneumonia or any acute cardiopulmonary process.  We also did an echocardiogram that showed normal EF of 60 to 65% and grossly no regional wall motion abnormalities no discernible etiology at this time for her shortness of breath other than possibly chemo induced anemia which has remained stable around 10.  Prior to starting chemotherapy her hemoglobin was closer to 12.  Continue to monitor.  Encourage physical activity.  If symptoms persist after chemotherapy she may need further cardiology evaluation including a possible stress test  Mild hypokalemia: Continue to monitor   Visit Diagnosis 1. Encounter for antineoplastic chemotherapy   2. B12 deficiency   3. Malignant neoplasm of upper-outer quadrant of left breast in female, estrogen receptor positive ([place])      Dr. Randa Evens, MD, MPH [facility] [phone] 10/29/2020 7:57 AM

## 2020-10-29 NOTE — Patient Instructions (Signed)
[facility] [place] [facility]  Discharge Instructions: Thank you for choosing [facility] to provide your oncology and hematology care.  If you have a lab appointment with the Cancer Center, please go directly to the Cancer Center and check in at the registration area.  Wear comfortable clothing and clothing appropriate for easy access to any Portacath or PICC line.   We strive to give you quality time with your provider. You may need to reschedule your appointment if you arrive late (15 or more minutes).  Arriving late affects you and other patients whose appointments are after yours.  Also, if you miss three or more appointments without notifying the office, you may be dismissed from the clinic at the provider's discretion.      For prescription refill requests, have your pharmacy contact our office and allow 72 hours for refills to be completed.    Today you received the following chemotherapy and/or immunotherapy agents Taxol        To help prevent nausea and vomiting after your treatment, we encourage you to take your nausea medication as directed.  BELOW ARE SYMPTOMS THAT SHOULD BE REPORTED IMMEDIATELY: *FEVER GREATER THAN 100.4 F (38 C) OR HIGHER *CHILLS OR SWEATING *NAUSEA AND VOMITING THAT IS NOT CONTROLLED WITH YOUR NAUSEA MEDICATION *UNUSUAL SHORTNESS OF BREATH *UNUSUAL BRUISING OR BLEEDING *URINARY PROBLEMS (pain or burning when urinating, or frequent urination) *BOWEL PROBLEMS (unusual diarrhea, constipation, pain near the anus) TENDERNESS IN MOUTH AND THROAT WITH OR WITHOUT PRESENCE OF ULCERS (sore throat, sores in mouth, or a toothache) UNUSUAL RASH, SWELLING OR PAIN  UNUSUAL VAGINAL DISCHARGE OR ITCHING   Items with * indicate a potential emergency and should be followed up as soon as possible or go to the Emergency Department if any problems should occur.  Please show the CHEMOTHERAPY ALERT CARD or IMMUNOTHERAPY ALERT CARD at check-in to  the Emergency Department and triage nurse.  Should you have questions after your visit or need to cancel or reschedule your appointment, please contact [facility] [place] [facility]  [phone] and follow the prompts.  Office hours are 8:00 a.m. to 4:30 p.m. Monday - Friday. Please note that voicemails left after 4:00 p.m. may not be returned until the following business day.  We are closed weekends and major holidays. You have access to a nurse at all times for urgent questions. Please call the main number to the clinic [phone] and follow the prompts.  For any non-urgent questions, you may also contact your provider using MyChart. We now offer e-Visits for anyone 18 and older to request care online for non-urgent symptoms. For details visit [email or web address].   Also download the MyChart app! Go to the app store, search "MyChart", open the app, select [facility], and log in with your MyChart username and password.  Due to Covid, a mask is required upon entering the hospital/clinic. If you do not have a mask, one will be given to you upon arrival. For doctor visits, patients may have 1 support person aged 18 or older with them. For treatment visits, patients cannot have anyone with them due to current Covid guidelines and our immunocompromised population.  

## 2020-10-31 ENCOUNTER — Telehealth: Payer: Self-pay | Admitting: *Deleted

## 2020-10-31 NOTE — Telephone Encounter (Signed)
Call returned to patient and informed that she can get the flu shot

## 2020-10-31 NOTE — Telephone Encounter (Signed)
Patient called asking if she can get her flu shot or if she needs to wait until she completes her chemotherapy to get it

## 2020-10-31 NOTE — Telephone Encounter (Signed)
Take the flu shot

## 2020-11-05 ENCOUNTER — Inpatient Hospital Stay: Payer: Medicare PPO

## 2020-11-05 ENCOUNTER — Inpatient Hospital Stay: Payer: Medicare PPO | Attending: Oncology

## 2020-11-05 VITALS — BP 119/67 | HR 91 | Temp 98.6°F | Resp 20 | Wt 234.0 lb

## 2020-11-05 DIAGNOSIS — C50412 Malignant neoplasm of upper-outer quadrant of left female breast: Secondary | ICD-10-CM

## 2020-11-05 DIAGNOSIS — Z5111 Encounter for antineoplastic chemotherapy: Secondary | ICD-10-CM | POA: Diagnosis not present

## 2020-11-05 DIAGNOSIS — Z170 Estrogen receptor positive status [ER+]: Secondary | ICD-10-CM | POA: Diagnosis not present

## 2020-11-05 DIAGNOSIS — E538 Deficiency of other specified B group vitamins: Secondary | ICD-10-CM | POA: Diagnosis not present

## 2020-11-05 LAB — COMPREHENSIVE METABOLIC PANEL WITH GFR
ALT: 30 U/L (ref 0–44)
AST: 23 U/L (ref 15–41)
Albumin: 3.7 g/dL (ref 3.5–5.0)
Alkaline Phosphatase: 108 U/L (ref 38–126)
Anion gap: 8 (ref 5–15)
BUN: 23 mg/dL (ref 8–23)
CO2: 29 mmol/L (ref 22–32)
Calcium: 8.8 mg/dL — ABNORMAL LOW (ref 8.9–10.3)
Chloride: 99 mmol/L (ref 98–111)
Creatinine, Ser: 0.92 mg/dL (ref 0.44–1.00)
GFR, Estimated: >60 mL/min
Glucose, Bld: 104 mg/dL — ABNORMAL HIGH (ref 70–99)
Potassium: 3.4 mmol/L — ABNORMAL LOW (ref 3.5–5.1)
Sodium: 136 mmol/L (ref 135–145)
Total Bilirubin: 0.4 mg/dL (ref 0.3–1.2)
Total Protein: 6.9 g/dL (ref 6.5–8.1)

## 2020-11-05 LAB — CBC WITH DIFFERENTIAL/PLATELET
Abs Immature Granulocytes: 0.08 10*3/uL — ABNORMAL HIGH (ref 0.00–0.07)
Basophils Absolute: 0.1 10*3/uL (ref 0.0–0.1)
Basophils Relative: 1 %
Eosinophils Absolute: 0.1 10*3/uL (ref 0.0–0.5)
Eosinophils Relative: 1 %
HCT: 30.9 % — ABNORMAL LOW (ref 36.0–46.0)
Hemoglobin: 10.1 g/dL — ABNORMAL LOW (ref 12.0–15.0)
Immature Granulocytes: 1 %
Lymphocytes Relative: 32 %
Lymphs Abs: 2 10*3/uL (ref 0.7–4.0)
MCH: 30.6 pg (ref 26.0–34.0)
MCHC: 32.7 g/dL (ref 30.0–36.0)
MCV: 93.6 fL (ref 80.0–100.0)
Monocytes Absolute: 0.6 10*3/uL (ref 0.1–1.0)
Monocytes Relative: 9 %
Neutro Abs: 3.4 10*3/uL (ref 1.7–7.7)
Neutrophils Relative %: 56 %
Platelets: 315 10*3/uL (ref 150–400)
RBC: 3.3 MIL/uL — ABNORMAL LOW (ref 3.87–5.11)
RDW: 17.1 % — ABNORMAL HIGH (ref 11.5–15.5)
WBC: 6.2 10*3/uL (ref 4.0–10.5)
nRBC: 1 % — ABNORMAL HIGH (ref 0.0–0.2)

## 2020-11-05 MED ORDER — SODIUM CHLORIDE 0.9% FLUSH
10.0000 mL | INTRAVENOUS | Status: DC | PRN
  Administered 2020-11-05: 10 mL via INTRAVENOUS
  Filled 2020-11-05: qty 10

## 2020-11-05 MED ORDER — HEPARIN SOD (PORK) LOCK FLUSH 100 UNIT/ML IV SOLN
INTRAVENOUS | Status: AC
  Filled 2020-11-05: qty 5

## 2020-11-05 MED ORDER — HEPARIN SOD (PORK) LOCK FLUSH 100 UNIT/ML IV SOLN
500.0000 [IU] | Freq: Once | INTRAVENOUS | Status: AC
  Administered 2020-11-05: 500 [IU] via INTRAVENOUS
  Filled 2020-11-05: qty 5

## 2020-11-05 MED ORDER — SODIUM CHLORIDE 0.9 % IV SOLN
65.0000 mg/m2 | Freq: Once | INTRAVENOUS | Status: AC
  Administered 2020-11-05: 144 mg via INTRAVENOUS
  Filled 2020-11-05: qty 24

## 2020-11-05 MED ORDER — SODIUM CHLORIDE 0.9 % IV SOLN
10.0000 mg | Freq: Once | INTRAVENOUS | Status: AC
  Administered 2020-11-05: 10 mg via INTRAVENOUS
  Filled 2020-11-05: qty 10

## 2020-11-05 MED ORDER — SODIUM CHLORIDE 0.9 % IV SOLN
Freq: Once | INTRAVENOUS | Status: AC
  Filled 2020-11-05: qty 250

## 2020-11-05 MED ORDER — FAMOTIDINE 20 MG IN NS 100 ML IVPB
20.0000 mg | Freq: Once | INTRAVENOUS | Status: AC
  Administered 2020-11-05: 20 mg via INTRAVENOUS
  Filled 2020-11-05: qty 20

## 2020-11-05 MED ORDER — HEPARIN SOD (PORK) LOCK FLUSH 100 UNIT/ML IV SOLN
500.0000 [IU] | Freq: Once | INTRAVENOUS | Status: DC | PRN
  Filled 2020-11-05: qty 5

## 2020-11-05 MED ORDER — DIPHENHYDRAMINE HCL 50 MG/ML IJ SOLN
50.0000 mg | Freq: Once | INTRAMUSCULAR | Status: AC
Start: 2020-11-05 — End: 2020-11-05
  Administered 2020-11-05: 50 mg via INTRAVENOUS
  Filled 2020-11-05: qty 1

## 2020-11-05 NOTE — Patient Instructions (Signed)
[facility]  Discharge Instructions: Thank you for choosing [facility] to provide your oncology and hematology care.  If you have a lab appointment with the [facility], please go directly to the [facility] and check in at the registration area.  Wear comfortable clothing and clothing appropriate for easy access to any Portacath or PICC line.   We strive to give you quality time with your provider. You may need to reschedule your appointment if you arrive late (15 or more minutes).  Arriving late affects you and other patients whose appointments are after yours.  Also, if you miss three or more appointments without notifying the office, you may be dismissed from the clinic at the provider's discretion.      For prescription refill requests, have your pharmacy contact our office and allow 72 hours for refills to be completed.    Today you received the following chemotherapy and/or immunotherapy agents:Taxol      To help prevent nausea and vomiting after your treatment, we encourage you to take your nausea medication as directed.  BELOW ARE SYMPTOMS THAT SHOULD BE REPORTED IMMEDIATELY: *FEVER GREATER THAN 100.4 F (38 C) OR HIGHER *CHILLS OR SWEATING *NAUSEA AND VOMITING THAT IS NOT CONTROLLED WITH YOUR NAUSEA MEDICATION *UNUSUAL SHORTNESS OF BREATH *UNUSUAL BRUISING OR BLEEDING *URINARY PROBLEMS (pain or burning when urinating, or frequent urination) *BOWEL PROBLEMS (unusual diarrhea, constipation, pain near the anus) TENDERNESS IN MOUTH AND THROAT WITH OR WITHOUT PRESENCE OF ULCERS (sore throat, sores in mouth, or a toothache) UNUSUAL RASH, SWELLING OR PAIN  UNUSUAL VAGINAL DISCHARGE OR ITCHING   Items with * indicate a potential emergency and should be followed up as soon as possible or go to the Emergency Department if any problems should occur.  Please show the CHEMOTHERAPY ALERT CARD or IMMUNOTHERAPY ALERT CARD at check-in to  the Emergency Department and triage nurse.  Should you have questions after your visit or need to cancel or reschedule your appointment, please contact [facility]  [phone] and follow the prompts.  Office hours are 8:00 a.m. to 4:30 p.m. Monday - Friday. Please note that voicemails left after 4:00 p.m. may not be returned until the following business day.  We are closed weekends and major holidays. You have access to a nurse at all times for urgent questions. Please call the main number to the clinic [phone] and follow the prompts.  For any non-urgent questions, you may also contact your provider using MyChart. We now offer e-Visits for anyone 38 and older to request care online for non-urgent symptoms. For details visit [email or web address].   Also download the MyChart app! Go to the app store, search "MyChart", open the app, select [facility], and log in with your MyChart username and password.  Due to Covid, a mask is required upon entering the hospital/clinic. If you do not have a mask, one will be given to you upon arrival. For doctor visits, patients may have 1 support person aged 39 or older with them. For treatment visits, patients cannot have anyone with them due to current Covid guidelines and our immunocompromised population. Paclitaxel injection What is this medication? PACLITAXEL (PAK li TAX el) is a chemotherapy drug. It targets fast dividing cells, like cancer cells, and causes these cells to die. This medicine is used to treat ovarian cancer, breast cancer, lung cancer, Kaposi's sarcoma, and other cancers. This medicine may be used for other purposes; ask your health care  provider or pharmacist if you have questions. COMMON BRAND NAME(S): Onxol, Taxol What should I tell my care team before I take this medication? They need to know if you have any of these conditions: history of irregular heartbeat liver disease low blood counts, like  low white cell, platelet, or red cell counts lung or breathing disease, like asthma tingling of the fingers or toes, or other nerve disorder an unusual or allergic reaction to paclitaxel, alcohol, polyoxyethylated castor oil, other chemotherapy, other medicines, foods, dyes, or preservatives pregnant or trying to get pregnant breast-feeding How should I use this medication? This drug is given as an infusion into a vein. It is administered in a hospital or clinic by a specially trained health care professional. Talk to your pediatrician regarding the use of this medicine in children. Special care may be needed. Overdosage: If you think you have taken too much of this medicine contact a poison control center or emergency room at once. NOTE: This medicine is only for you. Do not share this medicine with others. What if I miss a dose? It is important not to miss your dose. Call your doctor or health care professional if you are unable to keep an appointment. What may interact with this medication? Do not take this medicine with any of the following medications: live virus vaccines This medicine may also interact with the following medications: antiviral medicines for hepatitis, HIV or AIDS certain antibiotics like erythromycin and clarithromycin certain medicines for fungal infections like ketoconazole and itraconazole certain medicines for seizures like carbamazepine, phenobarbital, phenytoin gemfibrozil nefazodone rifampin St. John's wort This list may not describe all possible interactions. Give your health care provider a list of all the medicines, herbs, non-prescription drugs, or dietary supplements you use. Also tell them if you smoke, drink alcohol, or use illegal drugs. Some items may interact with your medicine. What should I watch for while using this medication? Your condition will be monitored carefully while you are receiving this medicine. You will need important blood work done  while you are taking this medicine. This medicine can cause serious allergic reactions. To reduce your risk you will need to take other medicine(s) before treatment with this medicine. If you experience allergic reactions like skin rash, itching or hives, swelling of the face, lips, or tongue, tell your doctor or health care professional right away. In some cases, you may be given additional medicines to help with side effects. Follow all directions for their use. This drug may make you feel generally unwell. This is not uncommon, as chemotherapy can affect healthy cells as well as cancer cells. Report any side effects. Continue your course of treatment even though you feel ill unless your doctor tells you to stop. Call your doctor or health care professional for advice if you get a fever, chills or sore throat, or other symptoms of a cold or flu. Do not treat yourself. This drug decreases your body's ability to fight infections. Try to avoid being around people who are sick. This medicine may increase your risk to bruise or bleed. Call your doctor or health care professional if you notice any unusual bleeding. Be careful brushing and flossing your teeth or using a toothpick because you may get an infection or bleed more easily. If you have any dental work done, tell your dentist you are receiving this medicine. Avoid taking products that contain aspirin, acetaminophen, ibuprofen, naproxen, or ketoprofen unless instructed by your doctor. These medicines may hide a fever. Do not  become pregnant while taking this medicine. Women should inform their doctor if they wish to become pregnant or think they might be pregnant. There is a potential for serious side effects to an unborn child. Talk to your health care professional or pharmacist for more information. Do not breast-feed an infant while taking this medicine. Men are advised not to father a child while receiving this medicine. This product may contain  alcohol. Ask your pharmacist or healthcare provider if this medicine contains alcohol. Be sure to tell all healthcare providers you are taking this medicine. Certain medicines, like metronidazole and disulfiram, can cause an unpleasant reaction when taken with alcohol. The reaction includes flushing, headache, nausea, vomiting, sweating, and increased thirst. The reaction can last from 30 minutes to several hours. What side effects may I notice from receiving this medication? Side effects that you should report to your doctor or health care professional as soon as possible: allergic reactions like skin rash, itching or hives, swelling of the face, lips, or tongue breathing problems changes in vision fast, irregular heartbeat high or low blood pressure mouth sores pain, tingling, numbness in the hands or feet signs of decreased platelets or bleeding - bruising, pinpoint red spots on the skin, black, tarry stools, blood in the urine signs of decreased red blood cells - unusually weak or tired, feeling faint or lightheaded, falls signs of infection - fever or chills, cough, sore throat, pain or difficulty passing urine signs and symptoms of liver injury like dark yellow or brown urine; general ill feeling or flu-like symptoms; light-colored stools; loss of appetite; nausea; right upper belly pain; unusually weak or tired; yellowing of the eyes or skin swelling of the ankles, feet, hands unusually slow heartbeat Side effects that usually do not require medical attention (report to your doctor or health care professional if they continue or are bothersome): diarrhea hair loss loss of appetite muscle or joint pain nausea, vomiting pain, redness, or irritation at site where injected tiredness This list may not describe all possible side effects. Call your doctor for medical advice about side effects. You may report side effects to FDA at 1-800-FDA-1088. Where should I keep my medication? This drug  is given in a hospital or clinic and will not be stored at home. NOTE: This sheet is a summary. It may not cover all possible information. If you have questions about this medicine, talk to your doctor, pharmacist, or health care provider.  2022 [employer]vier/Gold Standard (2018-12-22 13:37:23)

## 2020-11-12 ENCOUNTER — Encounter: Payer: Self-pay | Admitting: Oncology

## 2020-11-12 ENCOUNTER — Inpatient Hospital Stay: Payer: Medicare PPO

## 2020-11-12 ENCOUNTER — Other Ambulatory Visit: Payer: Self-pay

## 2020-11-12 ENCOUNTER — Other Ambulatory Visit: Payer: Self-pay | Admitting: *Deleted

## 2020-11-12 ENCOUNTER — Inpatient Hospital Stay: Payer: Medicare PPO | Admitting: Oncology

## 2020-11-12 VITALS — BP 115/71 | HR 94 | Temp 99.1°F | Resp 18 | Wt 238.2 lb

## 2020-11-12 DIAGNOSIS — Z5111 Encounter for antineoplastic chemotherapy: Secondary | ICD-10-CM

## 2020-11-12 DIAGNOSIS — C50412 Malignant neoplasm of upper-outer quadrant of left female breast: Secondary | ICD-10-CM

## 2020-11-12 DIAGNOSIS — Z170 Estrogen receptor positive status [ER+]: Secondary | ICD-10-CM | POA: Diagnosis not present

## 2020-11-12 DIAGNOSIS — E538 Deficiency of other specified B group vitamins: Secondary | ICD-10-CM | POA: Diagnosis not present

## 2020-11-12 LAB — CBC WITH DIFFERENTIAL/PLATELET
Abs Immature Granulocytes: 0.06 10*3/uL (ref 0.00–0.07)
Basophils Absolute: 0.1 10*3/uL (ref 0.0–0.1)
Basophils Relative: 1 %
Eosinophils Absolute: 0.1 10*3/uL (ref 0.0–0.5)
Eosinophils Relative: 1 %
HCT: 29.4 % — ABNORMAL LOW (ref 36.0–46.0)
Hemoglobin: 9.7 g/dL — ABNORMAL LOW (ref 12.0–15.0)
Immature Granulocytes: 1 %
Lymphocytes Relative: 33 %
Lymphs Abs: 2 10*3/uL (ref 0.7–4.0)
MCH: 31.2 pg (ref 26.0–34.0)
MCHC: 33 g/dL (ref 30.0–36.0)
MCV: 94.5 fL (ref 80.0–100.0)
Monocytes Absolute: 0.5 10*3/uL (ref 0.1–1.0)
Monocytes Relative: 8 %
Neutro Abs: 3.3 10*3/uL (ref 1.7–7.7)
Neutrophils Relative %: 56 %
Platelets: 263 10*3/uL (ref 150–400)
RBC: 3.11 MIL/uL — ABNORMAL LOW (ref 3.87–5.11)
RDW: 17.7 % — ABNORMAL HIGH (ref 11.5–15.5)
WBC: 5.9 10*3/uL (ref 4.0–10.5)
nRBC: 0.8 % — ABNORMAL HIGH (ref 0.0–0.2)

## 2020-11-12 LAB — COMPREHENSIVE METABOLIC PANEL
ALT: 32 U/L (ref 0–44)
AST: 23 U/L (ref 15–41)
Albumin: 3.6 g/dL (ref 3.5–5.0)
Alkaline Phosphatase: 111 U/L (ref 38–126)
Anion gap: 8 (ref 5–15)
BUN: 18 mg/dL (ref 8–23)
CO2: 29 mmol/L (ref 22–32)
Calcium: 8.6 mg/dL — ABNORMAL LOW (ref 8.9–10.3)
Chloride: 97 mmol/L — ABNORMAL LOW (ref 98–111)
Creatinine, Ser: 0.9 mg/dL (ref 0.44–1.00)
GFR, Estimated: >60 mL/min (ref >60)
Glucose, Bld: 94 mg/dL (ref 70–99)
Potassium: 3.6 mmol/L (ref 3.5–5.1)
Sodium: 134 mmol/L — ABNORMAL LOW (ref 135–145)
Total Bilirubin: 0.1 mg/dL — ABNORMAL LOW (ref 0.3–1.2)
Total Protein: 6.7 g/dL (ref 6.5–8.1)

## 2020-11-12 MED ORDER — SODIUM CHLORIDE 0.9 % IV SOLN
10.0000 mg | Freq: Once | INTRAVENOUS | Status: AC
  Administered 2020-11-12: 10 mg via INTRAVENOUS
  Filled 2020-11-12: qty 10

## 2020-11-12 MED ORDER — SODIUM CHLORIDE 0.9 % IV SOLN
Freq: Once | INTRAVENOUS | Status: AC
Start: 2020-11-12 — End: 2020-11-12
  Filled 2020-11-12: qty 250

## 2020-11-12 MED ORDER — SODIUM CHLORIDE 0.9 % IV SOLN
65.0000 mg/m2 | Freq: Once | INTRAVENOUS | Status: AC
  Administered 2020-11-12: 144 mg via INTRAVENOUS
  Filled 2020-11-12: qty 24

## 2020-11-12 MED ORDER — HEPARIN SOD (PORK) LOCK FLUSH 100 UNIT/ML IV SOLN
INTRAVENOUS | Status: AC
  Filled 2020-11-12: qty 5

## 2020-11-12 MED ORDER — FAMOTIDINE 20 MG IN NS 100 ML IVPB
20.0000 mg | Freq: Once | INTRAVENOUS | Status: AC
  Administered 2020-11-12: 20 mg via INTRAVENOUS
  Filled 2020-11-12: qty 20

## 2020-11-12 MED ORDER — HEPARIN SOD (PORK) LOCK FLUSH 100 UNIT/ML IV SOLN
INTRAVENOUS | Status: AC
  Administered 2020-11-12: 500 [IU]
  Filled 2020-11-12: qty 5

## 2020-11-12 MED ORDER — DIPHENHYDRAMINE HCL 50 MG/ML IJ SOLN
50.0000 mg | Freq: Once | INTRAMUSCULAR | Status: AC
Start: 2020-11-12 — End: 2020-11-12
  Administered 2020-11-12: 50 mg via INTRAVENOUS
  Filled 2020-11-12: qty 1

## 2020-11-12 MED ORDER — HEPARIN SOD (PORK) LOCK FLUSH 100 UNIT/ML IV SOLN
500.0000 [IU] | Freq: Once | INTRAVENOUS | Status: AC | PRN
  Filled 2020-11-12: qty 5

## 2020-11-12 NOTE — Progress Notes (Signed)
Pt states her eyes keeps running; thought it may be hay fever but was reading online and saw that it can also come from chemo.

## 2020-11-12 NOTE — Patient Instructions (Signed)
[facility]  Discharge Instructions: Thank you for choosing [facility] to provide your oncology and hematology care.  If you have a lab appointment with the Cancer Center, please go directly to the Cancer Center and check in at the registration area.  Wear comfortable clothing and clothing appropriate for easy access to any Portacath or PICC line.   We strive to give you quality time with your provider. You may need to reschedule your appointment if you arrive late (15 or more minutes).  Arriving late affects you and other patients whose appointments are after yours.  Also, if you miss three or more appointments without notifying the office, you may be dismissed from the clinic at the provider's discretion.      For prescription refill requests, have your pharmacy contact our office and allow 72 hours for refills to be completed.    Today you received the following chemotherapy and/or immunotherapy agents Taxol        To help prevent nausea and vomiting after your treatment, we encourage you to take your nausea medication as directed.  BELOW ARE SYMPTOMS THAT SHOULD BE REPORTED IMMEDIATELY: *FEVER GREATER THAN 100.4 F (38 C) OR HIGHER *CHILLS OR SWEATING *NAUSEA AND VOMITING THAT IS NOT CONTROLLED WITH YOUR NAUSEA MEDICATION *UNUSUAL SHORTNESS OF BREATH *UNUSUAL BRUISING OR BLEEDING *URINARY PROBLEMS (pain or burning when urinating, or frequent urination) *BOWEL PROBLEMS (unusual diarrhea, constipation, pain near the anus) TENDERNESS IN MOUTH AND THROAT WITH OR WITHOUT PRESENCE OF ULCERS (sore throat, sores in mouth, or a toothache) UNUSUAL RASH, SWELLING OR PAIN  UNUSUAL VAGINAL DISCHARGE OR ITCHING   Items with * indicate a potential emergency and should be followed up as soon as possible or go to the Emergency Department if any problems should occur.  Please show the CHEMOTHERAPY ALERT CARD or IMMUNOTHERAPY ALERT CARD at check-in to  the Emergency Department and triage nurse.  Should you have questions after your visit or need to cancel or reschedule your appointment, please contact [facility]  [phone] and follow the prompts.  Office hours are 8:00 a.m. to 4:30 p.m. Monday - Friday. Please note that voicemails left after 4:00 p.m. may not be returned until the following business day.  We are closed weekends and major holidays. You have access to a nurse at all times for urgent questions. Please call the main number to the clinic [phone] and follow the prompts.  For any non-urgent questions, you may also contact your provider using MyChart. We now offer e-Visits for anyone 18 and older to request care online for non-urgent symptoms. For details visit [email or web address].   Also download the MyChart app! Go to the app store, search "MyChart", open the app, select [facility], and log in with your MyChart username and password.  Due to Covid, a mask is required upon entering the hospital/clinic. If you do not have a mask, one will be given to you upon arrival. For doctor visits, patients may have 1 support person aged 18 or older with them. For treatment visits, patients cannot have anyone with them due to current Covid guidelines and our immunocompromised population.  

## 2020-11-12 NOTE — Progress Notes (Signed)
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  Patient began to notice retraction of her left nipple.  This was followed by a diagnostic mammogram which showed vague shadowing in the upper outer quadrant of the left breast without a definitive mass.  Benign cysts in the lower left breast measuring 1.5 cm.  No evidence of malignancy in the right breast.  No evidence of abnormal left axillary lymph nodes.  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   Bilateral breast MRI showed 7 x 6 x 4 cm area of non-mass enhancement in the upper outer left breast at 1 o'clock position.  This involves anterior and middle third of the breast and extension into the posterior third.  2 inferior left axillary lymph nodes with mild cortical thickening.   PET CT scan showed 1.7 cm hypermetabolic nodule in the midline of the  neck which could represent thyroglossal cyst but malignancy cannot be excluded. Patient underwent left-sided lumpectomy with nipple excision.     Final pathology showed invasive lobular carcinoma grade 210.5 cm with associated DCIS.  Skin/nipple are present and involved.  Invasive carcinoma invades the nipple without skin ulceration.  Lymphovascular invasion present.  Margins negative.  3 out of 4 lymph nodes positive for malignancy 11 mm largest deposit with extranodal extension.  pT3 pN1 a cM0   Patient was taken back for axillary dissection given 3 lymph nodes positive with extranodal extension and 4 more lymph nodes out of 9 were positive with extranodal extension   Given cardiovascular comorbidities and patient age none anthracycline adjuvant chemotherapy options were discussed.  Patient did not wish to proceed with TC chemotherapy and has decided with weekly Taxol chemotherapy  Interval history-she has baseline exertional shortness of breath but denies any new complaints at this time.  Neuropathy is stable  ECOG PS- 1 Pain scale- 0 Opioid associated constipation- no  Review of systems- Review of Systems  Constitutional:  Positive for malaise/fatigue. Negative for chills, fever and weight loss.  HENT:  Negative for congestion, ear discharge and nosebleeds.   Eyes:  Negative for blurred vision.  Respiratory:  Positive for shortness of breath. Negative for cough, hemoptysis, sputum production and wheezing.   Cardiovascular:  Negative for chest pain, palpitations, orthopnea and claudication.  Gastrointestinal:  Negative for abdominal pain, blood in stool, constipation, diarrhea, heartburn, melena, nausea and vomiting.  Genitourinary:  Negative for dysuria, flank pain, frequency, hematuria and urgency.  Musculoskeletal:  Negative for back pain, joint pain and myalgias.  Skin:  Negative for rash.  Neurological:  Negative  for dizziness, tingling, focal weakness, seizures, weakness and  headaches.  Endo/Heme/Allergies:  Does not bruise/bleed easily.  Psychiatric/Behavioral:  Negative for depression and suicidal ideas. The patient does not have insomnia.       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    Vitamin D deficiency      Past Surgical History:  Procedure Laterality Date   ABDOMINAL HYSTERECTOMY  1995   APPENDECTOMY  1994   BREAST BIOPSY Right 1998   neg   BREAST BIOPSY Left 05/30/2020   stere bx x clip path pending   BREAST LUMPECTOMY WITH SENTINEL LYMPH NODE BIOPSY Left 06/29/2020   Procedure: BREAST LUMPECTOMY WITH SENTINEL LYMPH NODE BX;  Surgeon: Robert Bellow, MD;  Location: [facility] ORS;  Service: General;  Laterality: Left;   ESI     LUMBAR MICRODISCECTOMY  2019   L3-4   LUMBAR MICRODISCECTOMY  2008   L4-L5 repeated   MICRODISCECTOMY LUMBAR  2008   L4-5   MICRODISCECTOMY LUMBAR  2010   L4-L5 repeated   NODE DISSECTION Left 08/13/2020   Procedure: AXILLARY NODE DISSECTION;  Surgeon: Robert Bellow, MD;  Location: [facility] ORS;  Service: General;  Laterality: Left;   PORTACATH PLACEMENT Right 09/05/2020   Procedure: INSERTION PORT-A-CATH;  Surgeon: Robert Bellow, MD;  Location: [facility] ORS;  Service: General;  Laterality: Right;  right; monitor anesthesia care   SQUAMOUS CELL CARCINOMA EXCISION     TONSILLECTOMY  1971     Social History   Socioeconomic History   Marital status: Widowed    Spouse name: Not on file   Number of children: 1   Years of education: Not on file   Highest education level: Bachelor's degree (e.g., BA, AB, BS)  Occupational History   Occupation: retired  Tobacco Use   Smoking status: Former    Packs/day: 0.91    Years: 33.00    Pack years: 30.03    Types: Cigarettes    Quit date: 04/19/2018    Years since quitting: 2.5   Smokeless tobacco: Never  Vaping Use   Vaping Use: Never used  Substance and Sexual Activity   Alcohol use: Yes    Comment: rarely   Drug use: No   Sexual activity: Not Currently  Other Topics Concern   Not on file  Social History Narrative   Not on file   Social Determinants of Health   Financial Resource Strain: Not on file  Food Insecurity: Not on file  Transportation Needs: Not on file  Physical Activity: Not on file  Stress: Not on file  Social Connections: Not on file  Intimate Partner Violence: Not on file  Family History  Problem Relation Age of Onset   Breast cancer Mother 35   Osteosarcoma Mother        d. 79   Hypertension Brother    COPD Father    Hypertension Father    Breast cancer Paternal Aunt        dx 21s   Breast cancer Paternal Grandmother        dx 106s   Stomach cancer Paternal Grandfather        dx 66s   Breast cancer Paternal Aunt        dx 58s   Brain cancer Paternal Aunt        dx 59s     Current Outpatient Medications:    acetaminophen (TYLENOL) 650 MG CR tablet, Take 1,300 mg by mouth every 8 (eight) hours as needed for pain., Disp: , Rfl:    Biotin 1000 MCG tablet, Take 1,000 mcg by mouth daily., Disp: , Rfl:    cholecalciferol (VITAMIN D) 25 MCG (1000 UNIT) tablet, Take 1,000 Units by mouth daily., Disp: , Rfl:    diazepam (VALIUM) 5 MG tablet, Take 1 tablet (5 mg total) by mouth every 12 (twelve) hours as needed for anxiety. (Patient taking differently: Take 5 mg by mouth in the morning.),  Disp: 60 tablet, Rfl: 5   hydrocortisone 2.5 % lotion, Apply 1 application topically daily as needed (when washing face)., Disp: , Rfl:    lidocaine-prilocaine (EMLA) cream, Apply to affected area once, Disp: 30 g, Rfl: 3   losartan (COZAAR) 100 MG tablet, TAKE 1 TABLET (100 MG TOTAL) BY MOUTH DAILY., Disp: 90 tablet, Rfl: 1   Menthol, Topical Analgesic, (BIOFREEZE EX), Apply 1 application topically daily as needed (back pain)., Disp: , Rfl:    mometasone (ELOCON) 0.1 % cream, Apply 1 application topically daily as needed (ear irritation)., Disp: , Rfl:    rosuvastatin (CRESTOR) 5 MG tablet, TAKE 1 TABLET (5 MG TOTAL) BY MOUTH DAILY., Disp: 90 tablet, Rfl: 1   triamterene-hydrochlorothiazide (DYAZIDE) 37.5-25 MG capsule, Take 1 each (1 capsule total) by mouth daily., Disp: 90 capsule, Rfl: 4   venlafaxine XR (EFFEXOR XR) 75 MG 24 hr capsule, Take 3 capsules (225 mg total) by mouth daily with breakfast., Disp: 270 capsule, Rfl: 1   ondansetron (ZOFRAN) 8 MG tablet, Take 1 tablet (8 mg total) by mouth 2 (two) times daily as needed (Nausea or vomiting). (Patient not taking: No sig reported), Disp: 30 tablet, Rfl: 1   Polyethyl Glycol-Propyl Glycol (SYSTANE OP), Place 1 drop into both eyes daily as needed (dry eyes). (Patient not taking: No sig reported), Disp: , Rfl:    prochlorperazine (COMPAZINE) 10 MG tablet, Take 1 tablet (10 mg total) by mouth every 6 (six) hours as needed (Nausea or vomiting). (Patient not taking: No sig reported), Disp: 30 tablet, Rfl: 1  Physical exam:  Vitals:   11/12/20 1049  Resp: 18  Temp: 99.1 F (37.3 C)  Weight: 238 lb 3.2 oz (108 kg)   Physical Exam Constitutional:      General: She is not in acute distress. Cardiovascular:     Rate and Rhythm: Normal rate and regular rhythm.     Heart sounds: Normal heart sounds.  Pulmonary:     Breath sounds: Normal breath sounds.     Comments: Effort of breathing increased Skin:    General: Skin is warm and dry.   Neurological:     Mental Status: She is alert and oriented to person,  place, and time.     CMP Latest Ref Rng & Units 11/05/2020  Glucose 70 - 99 mg/dL 104(H)  BUN 8 - 23 mg/dL 23  Creatinine 0.44 - 1.00 mg/dL 0.92  Sodium 135 - 145 mmol/L 136  Potassium 3.5 - 5.1 mmol/L 3.4(L)  Chloride 98 - 111 mmol/L 99  CO2 22 - 32 mmol/L 29  Calcium 8.9 - 10.3 mg/dL 8.8(L)  Total Protein 6.5 - 8.1 g/dL 6.9  Total Bilirubin 0.3 - 1.2 mg/dL 0.4  Alkaline Phos 38 - 126 U/L 108  AST 15 - 41 U/L 23  ALT 0 - 44 U/L 30   CBC Latest Ref Rng & Units 11/05/2020  WBC 4.0 - 10.5 K/uL 6.2  Hemoglobin 12.0 - 15.0 g/dL 10.1(L)  Hematocrit 36.0 - 46.0 % 30.9(L)  Platelets 150 - 400 K/uL 315    No images are attached to the encounter.  CT Angio Chest Pulmonary Embolism (PE) W or WO Contrast  Result Date: 10/22/2020 CLINICAL DATA:  Progressive shortness of breath. History of breast cancer. EXAM: CT ANGIOGRAPHY CHEST WITH CONTRAST TECHNIQUE: Multidetector CT imaging of the chest was performed using the standard protocol during bolus administration of intravenous contrast. Multiplanar CT image reconstructions and MIPs were obtained to evaluate the vascular anatomy. CONTRAST:  37m OMNIPAQUE IOHEXOL 350 MG/ML SOLN COMPARISON:  PET-CT 07/25/2020 FINDINGS: Cardiovascular: Satisfactory opacification of the pulmonary arteries to the segmental level. No evidence of pulmonary embolism. Normal heart size. No pericardial effusion. Atherosclerotic calcifications present along the thoracic aorta and coronary arteries. Right IJ approach single-lumen power injectable port catheter. Catheter tip terminates in the distal SVC. Mediastinum/Nodes: No enlarged mediastinal, hilar, or axillary lymph nodes. Thyroid gland, trachea, and esophagus demonstrate no significant findings. Lungs/Pleura: Lungs are clear. No pleural effusion or pneumothorax. Upper Abdomen: No acute abnormality. Hypoattenuation of the hepatic parenchyma with sparing  around the gallbladder fossa consistent with steatosis. Musculoskeletal: No acute fracture or aggressive appearing lytic or blastic osseous lesion. Surgical changes visible in the left breast consistent with prior lumpectomy and axillary node sampling. Review of the MIP images confirms the above findings. IMPRESSION: 1. Negative for pulmonary embolus, pneumonia or other acute cardiopulmonary process. 2. Coronary artery and aortic atherosclerotic calcifications. 3. Hepatic steatosis. Aortic Atherosclerosis (ICD10-I70.0). Electronically Signed   By: HJacqulynn CadetM.D.   On: 10/22/2020 15:53   ECHOCARDIOGRAM COMPLETE  Result Date: 10/16/2020    ECHOCARDIOGRAM REPORT   Patient Name:   JBRAZIL VOYTKODate of Exam: 10/16/2020 Medical Rec #:  [id number]   Height:       63.0 in Accession #:    [id number]  Weight:       233.7 lb Date of Birth:  [birth year]  BSA:          2.066 m Patient Age:    89years     BP:           122/66 mmHg Patient Gender: F            HR:           90 bpm. Exam Location:  [facility] Procedure: 2D Echo, Cardiac Doppler, Color Doppler, Strain Analysis and            Intracardiac Opacification Agent Indications:     Chemo Z09  History:         Patient has no prior history of Echocardiogram examinations.  Risk Factors:Hypertension. PVC.  Sonographer:     Sherrie Sport Referring Phys:  [id number] [facility] C Florence Yeung Diagnosing Phys: Harrell Gave End MD  Sonographer Comments: No parasternal window, no subcostal window, Technically challenging study due to limited acoustic windows and suboptimal apical window. Global longitudinal strain was attempted. IMPRESSIONS  1. Left ventricular ejection fraction, by estimation, is 60 to 65%. The left ventricle has normal function. Left ventricular endocardial border not optimally defined to evaluate regional wall motion. Left ventricular diastolic parameters are indeterminate.  2. Right ventricular systolic function is normal. The right ventricular size is  normal. Tricuspid regurgitation signal is inadequate for assessing PA pressure.  3. The mitral valve was not well visualized. Trivial mitral valve regurgitation. No evidence of mitral stenosis. Moderate mitral annular calcification.  4. The aortic valve was not well visualized. Aortic valve regurgitation is not visualized. No aortic stenosis is present. FINDINGS  Left Ventricle: Left ventricular ejection fraction, by estimation, is 60 to 65%. The left ventricle has normal function. Left ventricular endocardial border not optimally defined to evaluate regional wall motion. Definity contrast agent was given IV to delineate the left ventricular endocardial borders. Global longitudinal strain performed but not reported based on interpreter judgement due to suboptimal tracking. The left ventricular internal cavity size was normal in size. Left ventricular diastolic parameters are indeterminate. Right Ventricle: The right ventricular size is normal. Right vetricular wall thickness was not well visualized. Right ventricular systolic function is normal. Tricuspid regurgitation signal is inadequate for assessing PA pressure. Left Atrium: Left atrial size was normal in size. Right Atrium: Right atrial size was normal in size. Pericardium: The pericardium was not well visualized. Mitral Valve: The mitral valve was not well visualized. Moderate mitral annular calcification. Trivial mitral valve regurgitation. No evidence of mitral valve stenosis. MV peak gradient, 8.2 mmHg. The mean mitral valve gradient is 4.0 mmHg. Tricuspid Valve: The tricuspid valve is not well visualized. Tricuspid valve regurgitation is trivial. Aortic Valve: The aortic valve was not well visualized. Aortic valve regurgitation is not visualized. No aortic stenosis is present. Aortic valve mean gradient measures 5.0 mmHg. Aortic valve peak gradient measures 9.0 mmHg. Aortic valve area, by VTI measures 1.95 cm. Pulmonic Valve: The pulmonic valve was not  well visualized. Aorta: The ascending aorta was not well visualized. IAS/Shunts: The interatrial septum was not well visualized.  LEFT VENTRICLE PLAX 2D LVIDd:         3.30 cm LVIDs:         2.42 cm LV PW:         0.67 cm LV IVS:        1.19 cm LVOT diam:     2.00 cm LV SV:         53 LV SV Index:   26 LVOT Area:     3.14 cm  LEFT ATRIUM             Index       RIGHT ATRIUM          Index LA diam:        3.20 cm 1.55 cm/m  RA Area:     9.64 cm LA Vol (A2C):   43.9 ml 21.25 ml/m RA Volume:   18.30 ml 8.86 ml/m LA Vol (A4C):   31.9 ml 15.44 ml/m LA Biplane Vol: 37.9 ml 18.35 ml/m  AORTIC VALVE AV Area (Vmax):    1.87 cm AV Area (Vmean):   1.92 cm AV Area (VTI):     1.95  cm AV Vmax:           150.33 cm/s AV Vmean:          101.733 cm/s AV VTI:            0.272 m AV Peak Grad:      9.0 mmHg AV Mean Grad:      5.0 mmHg LVOT Vmax:         89.40 cm/s LVOT Vmean:        62.100 cm/s LVOT VTI:          0.169 m LVOT/AV VTI ratio: 0.62 MITRAL VALVE MV Area (PHT): 3.27 cm     SHUNTS MV Area VTI:   1.78 cm     Systemic VTI:  0.17 m MV Peak grad:  8.2 mmHg     Systemic Diam: 2.00 cm MV Mean grad:  4.0 mmHg MV Vmax:       1.43 m/s MV Vmean:      93.3 cm/s MV Decel Time: 232 msec MV E velocity: 91.70 cm/s MV A velocity: 123.00 cm/s MV E/A ratio:  0.75 Harrell Gave End MD Electronically signed by Nelva Bush MD Signature Date/Time: 10/16/2020/4:35:39 PM    Final      Assessment and plan- Patient is a 70 y.o. 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  She is here for on treatment assessment prior to cycle 10 of weekly Taxol chemotherapy  Counts okay to proceed with cycle 10 of weekly Taxol chemotherapy today.  She will directly proceed for cycle 11 next week and I will see her back in 2 weeks for cycle 12.  That would be her last adjuvant chemotherapy cycle.  She will be referred to radiation oncology at this time to discuss  adjuvant radiation treatment.  Given that her tumor was ER PR positive hormone therapy is indicated at this time.  Idiscussed the role for hormone therapy. Given that she is postmenopausal I would favor 5 years of adjuvant hormone therapy with aromatase inhibitor. I discussed the risks and benefits of letrozole including all but not limited to fatigue, hypercholesterolemia, hot flashes, arthralgias and worsening bone health.  Patient will also need to be on calcium 1200 mg along with vitamin D 800 international units.  We will obtain a baseline bone density scan written information about letrozole given to the patient. I would like her to finish radiation therapy and start hormone therapy thereafter. Patient verbalized understanding and agrees to proceed  We will also check Ki-67 on her tumor specimen to see if she would qualify for adjuvant abemaciclib following radiation treatment.  Given that she had high risk breast cancer to warrant chemotherapy she would also benefit from adjuvant bisphosphonates and I would recommend Zometa given every 6 months for up to 3 years to reduce the risk of skeletal fractures.  Discussed risks and benefits of Zometa including all but not limited to fatigue, hypocalcemia and possible risk of osteonecrosis of the jaw.  Discussed the need to receive dental clearance prior to starting Zometa.  We will plan to do this after radiation treatment.  We will obtain baseline bone density scan at this time     Visit Diagnosis 1. Encounter for antineoplastic chemotherapy   2. Malignant neoplasm of upper-outer quadrant of left breast in female, estrogen receptor positive ([place])      Dr. Randa Evens, MD, MPH [facility] at [facility] [phone] 11/12/2020  10:52 AM

## 2020-11-19 ENCOUNTER — Inpatient Hospital Stay: Payer: Medicare PPO

## 2020-11-19 ENCOUNTER — Encounter: Payer: Self-pay | Admitting: Radiation Oncology

## 2020-11-19 ENCOUNTER — Other Ambulatory Visit: Payer: Self-pay

## 2020-11-19 ENCOUNTER — Ambulatory Visit
Admission: RE | Admit: 2020-11-19 | Discharge: 2020-11-19 | Disposition: A | Discharge status: Home / Self Care | Payer: Medicare PPO | Source: Ambulatory Visit | Attending: Radiation Oncology | Admitting: Radiation Oncology

## 2020-11-19 VITALS — BP 134/74 | HR 107 | Temp 96.3°F | Resp 16 | Wt 236.0 lb

## 2020-11-19 VITALS — BP 125/73 | HR 102 | Temp 99.5°F | Resp 20 | Wt 235.8 lb

## 2020-11-19 DIAGNOSIS — J439 Emphysema, unspecified: Secondary | ICD-10-CM | POA: Insufficient documentation

## 2020-11-19 DIAGNOSIS — E7800 Pure hypercholesterolemia, unspecified: Secondary | ICD-10-CM | POA: Insufficient documentation

## 2020-11-19 DIAGNOSIS — Z803 Family history of malignant neoplasm of breast: Secondary | ICD-10-CM | POA: Insufficient documentation

## 2020-11-19 DIAGNOSIS — Z5111 Encounter for antineoplastic chemotherapy: Secondary | ICD-10-CM | POA: Diagnosis not present

## 2020-11-19 DIAGNOSIS — Z800 Family history of malignant neoplasm of digestive organs: Secondary | ICD-10-CM | POA: Insufficient documentation

## 2020-11-19 DIAGNOSIS — Z170 Estrogen receptor positive status [ER+]: Secondary | ICD-10-CM

## 2020-11-19 DIAGNOSIS — E559 Vitamin D deficiency, unspecified: Secondary | ICD-10-CM | POA: Diagnosis not present

## 2020-11-19 DIAGNOSIS — C50412 Malignant neoplasm of upper-outer quadrant of left female breast: Secondary | ICD-10-CM | POA: Insufficient documentation

## 2020-11-19 DIAGNOSIS — I493 Ventricular premature depolarization: Secondary | ICD-10-CM | POA: Diagnosis not present

## 2020-11-19 DIAGNOSIS — Z79899 Other long term (current) drug therapy: Secondary | ICD-10-CM | POA: Insufficient documentation

## 2020-11-19 DIAGNOSIS — I10 Essential (primary) hypertension: Secondary | ICD-10-CM | POA: Insufficient documentation

## 2020-11-19 DIAGNOSIS — F17210 Nicotine dependence, cigarettes, uncomplicated: Secondary | ICD-10-CM | POA: Insufficient documentation

## 2020-11-19 DIAGNOSIS — E538 Deficiency of other specified B group vitamins: Secondary | ICD-10-CM | POA: Diagnosis not present

## 2020-11-19 DIAGNOSIS — Z85828 Personal history of other malignant neoplasm of skin: Secondary | ICD-10-CM | POA: Insufficient documentation

## 2020-11-19 DIAGNOSIS — Z95828 Presence of other vascular implants and grafts: Secondary | ICD-10-CM

## 2020-11-19 LAB — CBC WITH DIFFERENTIAL/PLATELET
Abs Immature Granulocytes: 0.05 10*3/uL (ref 0.00–0.07)
Basophils Absolute: 0.1 10*3/uL (ref 0.0–0.1)
Basophils Relative: 1 %
Eosinophils Absolute: 0.1 10*3/uL (ref 0.0–0.5)
Eosinophils Relative: 1 %
HCT: 31.5 % — ABNORMAL LOW (ref 36.0–46.0)
Hemoglobin: 10.3 g/dL — ABNORMAL LOW (ref 12.0–15.0)
Immature Granulocytes: 1 %
Lymphocytes Relative: 33 %
Lymphs Abs: 2.2 10*3/uL (ref 0.7–4.0)
MCH: 31 pg (ref 26.0–34.0)
MCHC: 32.7 g/dL (ref 30.0–36.0)
MCV: 94.9 fL (ref 80.0–100.0)
Monocytes Absolute: 0.5 10*3/uL (ref 0.1–1.0)
Monocytes Relative: 8 %
Neutro Abs: 3.9 10*3/uL (ref 1.7–7.7)
Neutrophils Relative %: 56 %
Platelets: 321 10*3/uL (ref 150–400)
RBC: 3.32 MIL/uL — ABNORMAL LOW (ref 3.87–5.11)
RDW: 17.9 % — ABNORMAL HIGH (ref 11.5–15.5)
WBC: 6.8 10*3/uL (ref 4.0–10.5)
nRBC: 1.2 % — ABNORMAL HIGH (ref 0.0–0.2)

## 2020-11-19 LAB — COMPREHENSIVE METABOLIC PANEL
ALT: 33 U/L (ref 0–44)
AST: 28 U/L (ref 15–41)
Albumin: 3.8 g/dL (ref 3.5–5.0)
Alkaline Phosphatase: 110 U/L (ref 38–126)
Anion gap: 9 (ref 5–15)
BUN: 20 mg/dL (ref 8–23)
CO2: 28 mmol/L (ref 22–32)
Calcium: 9.3 mg/dL (ref 8.9–10.3)
Chloride: 100 mmol/L (ref 98–111)
Creatinine, Ser: 0.98 mg/dL (ref 0.44–1.00)
GFR, Estimated: >60 mL/min (ref >60)
Glucose, Bld: 122 mg/dL — ABNORMAL HIGH (ref 70–99)
Potassium: 3.5 mmol/L (ref 3.5–5.1)
Sodium: 137 mmol/L (ref 135–145)
Total Bilirubin: 0.3 mg/dL (ref 0.3–1.2)
Total Protein: 6.8 g/dL (ref 6.5–8.1)

## 2020-11-19 MED ORDER — HEPARIN SOD (PORK) LOCK FLUSH 100 UNIT/ML IV SOLN
500.0000 [IU] | Freq: Once | INTRAVENOUS | Status: DC
  Filled 2020-11-19: qty 5

## 2020-11-19 MED ORDER — FAMOTIDINE 20 MG IN NS 100 ML IVPB
20.0000 mg | Freq: Once | INTRAVENOUS | Status: AC
  Administered 2020-11-19: 20 mg via INTRAVENOUS
  Filled 2020-11-19: qty 20

## 2020-11-19 MED ORDER — SODIUM CHLORIDE 0.9 % IV SOLN
10.0000 mg | Freq: Once | INTRAVENOUS | Status: AC
  Administered 2020-11-19: 10 mg via INTRAVENOUS
  Filled 2020-11-19: qty 10

## 2020-11-19 MED ORDER — DIPHENHYDRAMINE HCL 50 MG/ML IJ SOLN
50.0000 mg | Freq: Once | INTRAMUSCULAR | Status: AC
Start: 2020-11-19 — End: 2020-11-19
  Administered 2020-11-19: 50 mg via INTRAVENOUS
  Filled 2020-11-19: qty 1

## 2020-11-19 MED ORDER — HEPARIN SOD (PORK) LOCK FLUSH 100 UNIT/ML IV SOLN
500.0000 [IU] | Freq: Once | INTRAVENOUS | Status: AC | PRN
Start: 2020-11-19 — End: 2020-11-19
  Administered 2020-11-19: 500 [IU]
  Filled 2020-11-19: qty 5

## 2020-11-19 MED ORDER — SODIUM CHLORIDE 0.9 % IV SOLN
65.0000 mg/m2 | Freq: Once | INTRAVENOUS | Status: AC
  Administered 2020-11-19: 144 mg via INTRAVENOUS
  Filled 2020-11-19: qty 24

## 2020-11-19 MED ORDER — SODIUM CHLORIDE 0.9 % IV SOLN
Freq: Once | INTRAVENOUS | Status: AC
  Filled 2020-11-19: qty 250

## 2020-11-19 MED ORDER — SODIUM CHLORIDE 0.9% FLUSH
10.0000 mL | Freq: Once | INTRAVENOUS | Status: AC
Start: 2020-11-19 — End: 2020-11-19
  Administered 2020-11-19: 10 mL via INTRAVENOUS
  Filled 2020-11-19: qty 10

## 2020-11-19 NOTE — Patient Instructions (Signed)
CANCER CENTER [place] [facility]  Discharge Instructions: Thank you for choosing [facility] to provide your oncology and hematology care.  If you have a lab appointment with the Cancer Center, please go directly to the Cancer Center and check in at the registration area.  Wear comfortable clothing and clothing appropriate for easy access to any Portacath or PICC line.   We strive to give you quality time with your provider. You may need to reschedule your appointment if you arrive late (15 or more minutes).  Arriving late affects you and other patients whose appointments are after yours.  Also, if you miss three or more appointments without notifying the office, you may be dismissed from the clinic at the provider's discretion.      For prescription refill requests, have your pharmacy contact our office and allow 72 hours for refills to be completed.      To help prevent nausea and vomiting after your treatment, we encourage you to take your nausea medication as directed.  BELOW ARE SYMPTOMS THAT SHOULD BE REPORTED IMMEDIATELY: *FEVER GREATER THAN 100.4 F (38 C) OR HIGHER *CHILLS OR SWEATING *NAUSEA AND VOMITING THAT IS NOT CONTROLLED WITH YOUR NAUSEA MEDICATION *UNUSUAL SHORTNESS OF BREATH *UNUSUAL BRUISING OR BLEEDING *URINARY PROBLEMS (pain or burning when urinating, or frequent urination) *BOWEL PROBLEMS (unusual diarrhea, constipation, pain near the anus) TENDERNESS IN MOUTH AND THROAT WITH OR WITHOUT PRESENCE OF ULCERS (sore throat, sores in mouth, or a toothache) UNUSUAL RASH, SWELLING OR PAIN  UNUSUAL VAGINAL DISCHARGE OR ITCHING   Items with * indicate a potential emergency and should be followed up as soon as possible or go to the Emergency Department if any problems should occur.  Please show the CHEMOTHERAPY ALERT CARD or IMMUNOTHERAPY ALERT CARD at check-in to the Emergency Department and triage nurse.  Should you have questions after your  visit or need to cancel or reschedule your appointment, please contact CANCER CENTER  [facility]  [phone] and follow the prompts.  Office hours are 8:00 a.m. to 4:30 p.m. Monday - Friday. Please note that voicemails left after 4:00 p.m. may not be returned until the following business day.  We are closed weekends and major holidays. You have access to a nurse at all times for urgent questions. Please call the main number to the clinic [phone] and follow the prompts.  For any non-urgent questions, you may also contact your provider using MyChart. We now offer e-Visits for anyone 18 and older to request care online for non-urgent symptoms. For details visit [email or web address].   Also download the MyChart app! Go to the app store, search "MyChart", open the app, select [place], and log in with your MyChart username and password.  Due to Covid, a mask is required upon entering the hospital/clinic. If you do not have a mask, one will be given to you upon arrival. For doctor visits, patients may have 1 support person aged 18 or older with them. For treatment visits, patients cannot have anyone with them due to current Covid guidelines and our immunocompromised population.  

## 2020-11-19 NOTE — Consult Note (Signed)
NEW PATIENT EVALUATION  Name: Victoria Dean  MRN: [id number]  Date:   11/19/2020     DOB: [birth year]   This 70 y.o. female patient presents to the clinic for initial evaluation of stage IIIa (T3 N1 M0) ER/PR positive HER2 negative.  Invasive lobular Carcinoma.  Of the left breast  REFERRING PHYSICIAN: Jerrol Banana.,*  CHIEF COMPLAINT:  Chief Complaint  Patient presents with   Breast Cancer    Initial consultation    DIAGNOSIS: The encounter diagnosis was Malignant neoplasm of upper-outer quadrant of left breast in female, estrogen receptor positive ([place]).   PREVIOUS INVESTIGATIONS:  Mammograms ultrasound PET CT scan and MRI of breasts reviewed Pathology report reviewed Clinical notes reviewed  HPI: Patient is a 70 year old female who presented with nipple retraction of her left breast.  Mammogram showed distortion within the outer portion of the left breast confirmed on ultrasound.  Initial biopsy was positive for invasive lobular carcinoma.  MRI of the breast showed a 7 x 6 x 4 cm area of the left breast.  2 left inferior axillary lymph nodes with mild cortical thickening suspicious for metastatic disease.  PET CT scan showed postoperative changes from recent lumpectomy and sentinel lymph node biopsy no evidence of distant metastatic disease.  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  Patient underwent a wide local excision for overall grade 210.5 cm invasive lobular carcinoma with direct invasion into the nipple without skin ulceration.  There was lymph vascular invasion present.  Margins were clear but close at 1 mm for invasive component.  DCIS margin was 2 mm.  There were 3 of 4 lymph nodes with macro metastatic disease.  Again tumor was strongly ER/PR positive HER2/neu not overexpressed.  She underwent a reexcision of her axilla showing now 4 of 9 lymph nodes involved with metastatic disease with extranodal extension  identified.  She has been treated with weekly Taxol which will complete in the next 2 weeks.  She is now referred to radiation oncology for consideration of treatment.  She specifically [place] breast tenderness cough or bone pain.  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    PAST SURGICAL HISTORY:  Past Surgical History:  Procedure Laterality Date   ABDOMINAL HYSTERECTOMY  1995   APPENDECTOMY  1994   BREAST BIOPSY Right 1998   neg   BREAST BIOPSY Left 05/30/2020   stere bx x clip path pending   BREAST LUMPECTOMY WITH SENTINEL LYMPH NODE BIOPSY Left 06/29/2020   Procedure: BREAST LUMPECTOMY WITH SENTINEL LYMPH NODE BX;  Surgeon: Robert Bellow, MD;  Location: [facility] ORS;  Service: General;  Laterality: Left;   ESI     LUMBAR MICRODISCECTOMY  2019   L3-4   LUMBAR MICRODISCECTOMY  2008   L4-L5 repeated   MICRODISCECTOMY LUMBAR  2008   L4-5   MICRODISCECTOMY LUMBAR  2010   L4-L5 repeated   NODE DISSECTION Left 08/13/2020   Procedure: AXILLARY NODE DISSECTION;  Surgeon: Robert Bellow, MD;  Location: [facility] ORS;  Service: General;  Laterality: Left;  PORTACATH PLACEMENT Right 09/05/2020   Procedure: INSERTION PORT-A-CATH;  Surgeon: Robert Bellow, MD;  Location: [facility] ORS;  Service: General;  Laterality: Right;  right; monitor anesthesia care   SQUAMOUS CELL CARCINOMA EXCISION     TONSILLECTOMY  1971    FAMILY HISTORY: family history includes Brain cancer in her paternal aunt; Breast cancer in her paternal aunt, paternal aunt, and  paternal grandmother; Breast cancer (age of onset: 68) in her mother; COPD in her father; Hypertension in her brother and father; Osteosarcoma in her mother; Stomach cancer in her paternal grandfather.  SOCIAL HISTORY:  reports that she quit smoking about 2 years ago. Her smoking use included cigarettes. She has a 30.03 pack-year smoking history. She has never used smokeless tobacco. She reports current alcohol use. She reports that she does not use drugs.  ALLERGIES: Other, Statins, Zetia [ezetimibe], and Sulfa antibiotics  MEDICATIONS:  Current Outpatient Medications  Medication Sig Dispense Refill   acetaminophen (TYLENOL) 650 MG CR tablet Take 1,300 mg by mouth every 8 (eight) hours as needed for pain.     Biotin 1000 MCG tablet Take 1,000 mcg by mouth daily.     cholecalciferol (VITAMIN D) 25 MCG (1000 UNIT) tablet Take 1,000 Units by mouth daily.     diazepam (VALIUM) 5 MG tablet Take 1 tablet (5 mg total) by mouth every 12 (twelve) hours as needed for anxiety. (Patient taking differently: Take 5 mg by mouth in the morning.) 60 tablet 5   hydrocortisone 2.5 % lotion Apply 1 application topically daily as needed (when washing face).     lidocaine-prilocaine (EMLA) cream Apply to affected area once 30 g 3   losartan (COZAAR) 100 MG tablet TAKE 1 TABLET (100 MG TOTAL) BY MOUTH DAILY. 90 tablet 1   Menthol, Topical Analgesic, (BIOFREEZE EX) Apply 1 application topically daily as needed (back pain).     mometasone (ELOCON) 0.1 % cream Apply 1 application topically daily as needed (ear irritation).     rosuvastatin (CRESTOR) 5 MG tablet TAKE 1 TABLET (5 MG TOTAL) BY MOUTH DAILY. 90 tablet 1   triamterene-hydrochlorothiazide (DYAZIDE) 37.5-25 MG capsule Take 1 each (1 capsule total) by mouth daily. 90 capsule 4   venlafaxine XR (EFFEXOR XR) 75 MG 24 hr capsule Take 3 capsules (225 mg total) by mouth daily with breakfast. 270 capsule 1   ondansetron (ZOFRAN) 8 MG tablet Take 1 tablet (8 mg total)  by mouth 2 (two) times daily as needed (Nausea or vomiting). (Patient not taking: No sig reported) 30 tablet 1   Polyethyl Glycol-Propyl Glycol (SYSTANE OP) Place 1 drop into both eyes daily as needed (dry eyes). (Patient not taking: No sig reported)     prochlorperazine (COMPAZINE) 10 MG tablet Take 1 tablet (10 mg total) by mouth every 6 (six) hours as needed (Nausea or vomiting). (Patient not taking: No sig reported) 30 tablet 1   No current facility-administered medications for this encounter.   Facility-Administered Medications Ordered in Other Encounters  Medication Dose Route Frequency Provider Last Rate Last Admin   heparin lock flush 100 unit/mL  500 Units Intracatheter Once PRN Sindy Guadeloupe, MD       PACLitaxel (TAXOL) 144 mg in sodium chloride 0.9 % 250 mL chemo infusion (</= $RemoveBefor'80mg'EGNMMYvaqYtm$ /m2)  65 mg/m2 (Treatment Plan Recorded) Intravenous Once Sindy Guadeloupe, MD 274 mL/hr at 11/19/20 1143 144 mg at 11/19/20 1143    ECOG PERFORMANCE STATUS:  0 - Asymptomatic  REVIEW OF SYSTEMS: Patient has multiple  medical comorbidities including PVC hypertension dysrhythmia emphysema of the lung. Patient denies any weight loss, fatigue, weakness, fever, chills or night sweats. Patient denies any loss of vision, blurred vision. Patient denies any ringing  of the ears or hearing loss. No irregular heartbeat. Patient denies heart murmur or history of fainting. Patient denies any chest pain or pain radiating to her upper extremities. Patient denies any shortness of breath, difficulty breathing at night, cough or hemoptysis. Patient denies any swelling in the lower legs. Patient denies any nausea vomiting, vomiting of blood, or coffee ground material in the vomitus. Patient denies any stomach pain. Patient states has had normal bowel movements no significant constipation or diarrhea. Patient denies any dysuria, hematuria or significant nocturia. Patient denies any problems walking, swelling in the joints or loss of  balance. Patient denies any skin changes, loss of hair or loss of weight. Patient denies any excessive worrying or anxiety or significant depression. Patient denies any problems with insomnia. Patient denies excessive thirst, polyuria, polydipsia. Patient denies any swollen glands, patient denies easy bruising or easy bleeding. Patient denies any recent infections, allergies or URI. Patient "s visual fields have not changed significantly in recent time.   PHYSICAL EXAM: BP 134/74 (BP Location: Right Arm, Patient Position: Sitting)   Pulse (!) 107   Temp (!) 96.3 F (35.7 C) (Tympanic)   Resp 16   Wt 236 lb (107 kg)   BMI 41.81 kg/m  She is status post wide local excision with sacrifice of the nipple areolar complex of the left breast.  No dominant masses noted in either breast.  No axillary or supraclavicular adenopathy is identified.  Well-developed well-nourished patient in NAD. HEENT reveals PERLA, EOMI, discs not visualized.  Oral cavity is clear. No oral mucosal lesions are identified. Neck is clear without evidence of cervical or supraclavicular adenopathy. Lungs are clear to A&P. Cardiac examination is essentially unremarkable with regular rate and rhythm without murmur rub or thrill. Abdomen is benign with no organomegaly or masses noted. Motor sensory and DTR levels are equal and symmetric in the upper and lower extremities. Cranial nerves II through XII are grossly intact. Proprioception is intact. No peripheral adenopathy or edema is identified. No motor or sensory levels are noted. Crude visual fields are within normal range.  LABORATORY DATA: Pathology report reviewed    RADIOLOGY RESULTS: PET scan mammograms ultrasound and breast MRI scans all reviewed compatible with above-stated findings   IMPRESSION: Stage III invasive lobular carcinoma the left breast status post wide local excision and sentinel node biopsy as well as axillary lymph node dissection and adjuvant Taxol chemotherapy  in 70 year old female  PLAN: At this time elect to go ahead with adjuvant radiation therapy to her left breast and peripheral lymphatics.  Would plan on delivering 5040 cGy in 28 fractions.  Would also boost her scar based on her close margins another 1600 cGy using electron beam.  Risks and benefits of treatment including skin reaction fatigue alteration of blood counts possible inclusion of superficial lung slight chance of lymphedema all were discussed in detail with the patient and her daughter.  They both seem to comprehend my treatment plan well.  I have set up her CT simulation about 2 weeks to allow for completion of her Taxol chemotherapy.  Patient knows to call with any concerns at any time.  I would like to take this opportunity to thank you for allowing me to participate in the care of your patient.Noreene Filbert,  MD

## 2020-11-20 ENCOUNTER — Encounter: Payer: Self-pay | Admitting: Oncology

## 2020-11-21 ENCOUNTER — Other Ambulatory Visit: Payer: Self-pay | Admitting: Oncology

## 2020-11-26 ENCOUNTER — Inpatient Hospital Stay: Payer: Medicare PPO

## 2020-11-26 ENCOUNTER — Inpatient Hospital Stay: Payer: Medicare PPO | Admitting: Nurse Practitioner

## 2020-11-26 ENCOUNTER — Other Ambulatory Visit: Payer: Self-pay

## 2020-11-26 ENCOUNTER — Encounter: Payer: Self-pay | Admitting: Nurse Practitioner

## 2020-11-26 VITALS — BP 146/81 | HR 99 | Temp 98.7°F | Resp 20 | Wt 238.4 lb

## 2020-11-26 DIAGNOSIS — E876 Hypokalemia: Secondary | ICD-10-CM

## 2020-11-26 DIAGNOSIS — C50412 Malignant neoplasm of upper-outer quadrant of left female breast: Secondary | ICD-10-CM | POA: Diagnosis not present

## 2020-11-26 DIAGNOSIS — E538 Deficiency of other specified B group vitamins: Secondary | ICD-10-CM

## 2020-11-26 DIAGNOSIS — Z170 Estrogen receptor positive status [ER+]: Secondary | ICD-10-CM | POA: Diagnosis not present

## 2020-11-26 DIAGNOSIS — Z5111 Encounter for antineoplastic chemotherapy: Secondary | ICD-10-CM

## 2020-11-26 LAB — CBC WITH DIFFERENTIAL/PLATELET
Abs Immature Granulocytes: 0.08 10*3/uL — ABNORMAL HIGH (ref 0.00–0.07)
Basophils Absolute: 0.1 10*3/uL (ref 0.0–0.1)
Basophils Relative: 1 %
Eosinophils Absolute: 0 10*3/uL (ref 0.0–0.5)
Eosinophils Relative: 1 %
HCT: 29.7 % — ABNORMAL LOW (ref 36.0–46.0)
Hemoglobin: 9.7 g/dL — ABNORMAL LOW (ref 12.0–15.0)
Immature Granulocytes: 1 %
Lymphocytes Relative: 35 %
Lymphs Abs: 2 10*3/uL (ref 0.7–4.0)
MCH: 31.3 pg (ref 26.0–34.0)
MCHC: 32.7 g/dL (ref 30.0–36.0)
MCV: 95.8 fL (ref 80.0–100.0)
Monocytes Absolute: 0.6 10*3/uL (ref 0.1–1.0)
Monocytes Relative: 10 %
Neutro Abs: 3.1 10*3/uL (ref 1.7–7.7)
Neutrophils Relative %: 52 %
Platelets: 294 10*3/uL (ref 150–400)
RBC: 3.1 MIL/uL — ABNORMAL LOW (ref 3.87–5.11)
RDW: 17.8 % — ABNORMAL HIGH (ref 11.5–15.5)
WBC: 5.8 10*3/uL (ref 4.0–10.5)
nRBC: 0.7 % — ABNORMAL HIGH (ref 0.0–0.2)

## 2020-11-26 LAB — COMPREHENSIVE METABOLIC PANEL
ALT: 26 U/L (ref 0–44)
AST: 21 U/L (ref 15–41)
Albumin: 3.5 g/dL (ref 3.5–5.0)
Alkaline Phosphatase: 103 U/L (ref 38–126)
Anion gap: 5 (ref 5–15)
BUN: 24 mg/dL — ABNORMAL HIGH (ref 8–23)
CO2: 30 mmol/L (ref 22–32)
Calcium: 8.4 mg/dL — ABNORMAL LOW (ref 8.9–10.3)
Chloride: 102 mmol/L (ref 98–111)
Creatinine, Ser: 0.84 mg/dL (ref 0.44–1.00)
GFR, Estimated: >60 mL/min (ref >60)
Glucose, Bld: 107 mg/dL — ABNORMAL HIGH (ref 70–99)
Potassium: 3.3 mmol/L — ABNORMAL LOW (ref 3.5–5.1)
Sodium: 137 mmol/L (ref 135–145)
Total Bilirubin: 0.6 mg/dL (ref 0.3–1.2)
Total Protein: 6.4 g/dL — ABNORMAL LOW (ref 6.5–8.1)

## 2020-11-26 MED ORDER — DIPHENHYDRAMINE HCL 50 MG/ML IJ SOLN
50.0000 mg | Freq: Once | INTRAMUSCULAR | Status: AC
  Administered 2020-11-26: 50 mg via INTRAVENOUS
  Filled 2020-11-26: qty 1

## 2020-11-26 MED ORDER — SODIUM CHLORIDE 0.9 % IV SOLN
Freq: Once | INTRAVENOUS | Status: AC
  Filled 2020-11-26: qty 250

## 2020-11-26 MED ORDER — FAMOTIDINE 20 MG IN NS 100 ML IVPB
20.0000 mg | Freq: Once | INTRAVENOUS | Status: AC
  Administered 2020-11-26: 20 mg via INTRAVENOUS
  Filled 2020-11-26: qty 20

## 2020-11-26 MED ORDER — HEPARIN SOD (PORK) LOCK FLUSH 100 UNIT/ML IV SOLN
500.0000 [IU] | Freq: Once | INTRAVENOUS | Status: AC | PRN
  Filled 2020-11-26: qty 5

## 2020-11-26 MED ORDER — SODIUM CHLORIDE 0.9 % IV SOLN
10.0000 mg | Freq: Once | INTRAVENOUS | Status: AC
  Administered 2020-11-26: 10 mg via INTRAVENOUS
  Filled 2020-11-26: qty 10

## 2020-11-26 MED ORDER — SODIUM CHLORIDE 0.9% FLUSH
10.0000 mL | Freq: Once | INTRAVENOUS | Status: AC
  Administered 2020-11-26: 10 mL via INTRAVENOUS
  Filled 2020-11-26: qty 10

## 2020-11-26 MED ORDER — SODIUM CHLORIDE 0.9 % IV SOLN
65.0000 mg/m2 | Freq: Once | INTRAVENOUS | Status: AC
  Administered 2020-11-26: 144 mg via INTRAVENOUS
  Filled 2020-11-26: qty 24

## 2020-11-26 MED ORDER — HEPARIN SOD (PORK) LOCK FLUSH 100 UNIT/ML IV SOLN
INTRAVENOUS | Status: AC
  Administered 2020-11-26: 500 [IU]
  Filled 2020-11-26: qty 5

## 2020-11-26 MED ORDER — POTASSIUM CHLORIDE CRYS ER 20 MEQ PO TBCR: 20.0000 meq | EXTENDED_RELEASE_TABLET | Freq: Every day | ORAL | 0 refills | Status: DC

## 2020-11-26 MED ORDER — CYANOCOBALAMIN 1000 MCG/ML IJ SOLN
1000.0000 ug | INTRAMUSCULAR | Status: DC
  Administered 2020-11-26: 1000 ug via INTRAMUSCULAR
  Filled 2020-11-26: qty 1

## 2020-11-26 MED ORDER — CYANOCOBALAMIN 1000 MCG/ML IJ SOLN: 1000.0000 ug | INTRAMUSCULAR | 0 refills | Status: DC

## 2020-11-26 NOTE — Progress Notes (Signed)
Per NP, Beckey Rutter, order: patient should receive Vitamin B-12 injection today in addition to scheduled Taxol treatment; order is in supportive therapy plan. Also per NP: Potassium prescription is being sent to patient's pharmacy. Patient informed and verbalized understanding.  [place], NP, notified staff to educate patient on self-administration of Intramuscular Vitamin B-12 injections, as patient will begin administering Vitamin B-12 injections at home in the future. Patient educated verbally and by staff demonstration on Vitamin B-12 injection administration today. Patient verbalized understanding. Patient reports she will not be administering the Vitamin B-12 injections herself, but reports her daughter will be the one administering her Vitamin B-12 injections. Discussed with Ranelle Oyster, AD; Maudie Mercury will coordinate with Novella Olive, RN, when patient and daughter can come back to the clinic for further education.

## 2020-11-26 NOTE — Progress Notes (Signed)
Patient had a fall 1 week ago with no injury.

## 2020-11-26 NOTE — Patient Instructions (Signed)
[facility]   Discharge Instructions: Thank you for choosing [facility] to provide your oncology and hematology care.  If you have a lab appointment with the [facility], please go directly to the [facility] and check in at the registration area.  Wear comfortable clothing and clothing appropriate for easy access to any Portacath or PICC line.   We strive to give you quality time with your provider. You may need to reschedule your appointment if you arrive late (15 or more minutes).  Arriving late affects you and other patients whose appointments are after yours.  Also, if you miss three or more appointments without notifying the office, you may be dismissed from the clinic at the provider's discretion.      For prescription refill requests, have your pharmacy contact our office and allow 72 hours for refills to be completed.    Today you received the following chemotherapy and/or immunotherapy agents: Taxol. Today you also received the following: Vitamin B-12 injection.      To help prevent nausea and vomiting after your treatment, we encourage you to take your nausea medication as directed.  BELOW ARE SYMPTOMS THAT SHOULD BE REPORTED IMMEDIATELY: *FEVER GREATER THAN 100.4 F (38 C) OR HIGHER *CHILLS OR SWEATING *NAUSEA AND VOMITING THAT IS NOT CONTROLLED WITH YOUR NAUSEA MEDICATION *UNUSUAL SHORTNESS OF BREATH *UNUSUAL BRUISING OR BLEEDING *URINARY PROBLEMS (pain or burning when urinating, or frequent urination) *BOWEL PROBLEMS (unusual diarrhea, constipation, pain near the anus) TENDERNESS IN MOUTH AND THROAT WITH OR WITHOUT PRESENCE OF ULCERS (sore throat, sores in mouth, or a toothache) UNUSUAL RASH, SWELLING OR PAIN  UNUSUAL VAGINAL DISCHARGE OR ITCHING   Items with * indicate a potential emergency and should be followed up as soon as possible or go to the Emergency Department if any problems should occur.  Please show the  CHEMOTHERAPY ALERT CARD or IMMUNOTHERAPY ALERT CARD at check-in to the Emergency Department and triage nurse.  Should you have questions after your visit or need to cancel or reschedule your appointment, please contact [facility]  [phone] and follow the prompts.  Office hours are 8:00 a.m. to 4:30 p.m. Monday - Friday. Please note that voicemails left after 4:00 p.m. may not be returned until the following business day.  We are closed weekends and major holidays. You have access to a nurse at all times for urgent questions. Please call the main number to the clinic [phone] and follow the prompts.  For any non-urgent questions, you may also contact your provider using MyChart. We now offer e-Visits for anyone 69 and older to request care online for non-urgent symptoms. For details visit [email or web address].   Also download the MyChart app! Go to the app store, search "MyChart", open the app, select [facility], and log in with your MyChart username and password.  Due to Covid, a mask is required upon entering the hospital/clinic. If you do not have a mask, one will be given to you upon arrival. For doctor visits, patients may have 1 support person aged 47 or older with them. For treatment visits, patients cannot have anyone with them due to current Covid guidelines and our immunocompromised population.

## 2020-11-26 NOTE — Progress Notes (Signed)
Hematology/Oncology Consult Note [facility]  [phone] [phone]  Patient Care Team: Jerrol Banana., MD as PCP - General (Family Medicine) Lorelee Cover., MD as Consulting Physician (Ophthalmology) Dimmig, Marcello Moores, MD as Referring Physician (Orthopedic Surgery) Oneta Rack, MD (Dermatology) Dasher, Rayvon Char, MD (Dermatology) Sindy Guadeloupe, MD as Consulting Physician (Hematology and Oncology)   Name of the patient: Victoria Dean  [id number]  Dec 01, 1950   Date of visit: 11/26/20  Diagnosis- pathological prognostic stage Ib invasive lobular carcinoma of the left breast [id number] ER/PR positive HER2 negative  Chief complaint/ Reason for visit-on treatment assessment prior to cycle 12 of weekly Taxol chemotherapy   Heme/Onc history: Patient is a 70 year old female with a past medical history significant for claustrophobia, hypertension hypercholesterolemia, depression among other medical problems.  Patient began to notice retraction of her left nipple.  This was followed by a diagnostic mammogram which showed vague shadowing in the upper outer quadrant of the left breast without a definitive mass.  Benign cysts in the lower left breast measuring 1.5 cm.  No evidence of malignancy in the right breast.  No evidence of abnormal left axillary lymph nodes.  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   Bilateral breast MRI showed 7 x 6 x 4 cm area of non-mass enhancement in the upper outer left breast at 1 o'clock position.  This involves anterior and middle third of the breast and extension into the posterior third.  2 inferior left axillary lymph nodes with mild cortical thickening.   PET CT scan showed 1.7 cm hypermetabolic nodule in the midline of the neck  which could represent thyroglossal cyst but malignancy cannot be excluded. Patient underwent left-sided lumpectomy with nipple excision.     Final pathology showed invasive lobular carcinoma grade 210.5 cm with associated DCIS.  Skin/nipple are present and involved.  Invasive carcinoma invades the nipple without skin ulceration.  Lymphovascular invasion present.  Margins negative.  3 out of 4 lymph nodes positive for malignancy 11 mm largest deposit with extranodal extension.  pT3 pN1 a cM0   Patient was taken back for axillary dissection given 3 lymph nodes positive with extranodal extension and 4 more lymph nodes out of 9 were positive with extranodal extension   Given cardiovascular comorbidities and patient age none anthracycline adjuvant chemotherapy options were discussed.  Patient did not wish to proceed with TC chemotherapy and has decided with weekly Taxol chemotherapy  Interval history- Patient is 70 year old female who returns to clinic for labs, further evaluation, and consideration of cycle 12 of weekly taxol and b12 injection. She feels well. Denies specific complaints. She has chronic shortness of breath which is worsened with claustrophobia and exertion. Otherwise feels well.   ECOG PS- 1 Pain scale- 0 Opioid associated constipation- no  Review of systems- Review of Systems  Constitutional:  Negative for chills, fever, malaise/fatigue and weight loss.  HENT:  Negative for congestion, ear discharge and nosebleeds.   Eyes:  Negative for blurred vision.  Respiratory:  Positive for shortness of breath. Negative for cough, hemoptysis, sputum production and wheezing.   Cardiovascular:  Negative for chest pain, palpitations, orthopnea and claudication.  Gastrointestinal:  Negative for abdominal pain, blood in stool, constipation, diarrhea, heartburn, melena, nausea and vomiting.  Genitourinary:  Negative for dysuria, flank  pain, frequency, hematuria and urgency.  Musculoskeletal:   Negative for back pain, joint pain and myalgias.  Skin:  Negative for rash.  Neurological:  Negative for dizziness, tingling, focal weakness, seizures, weakness and headaches.  Endo/Heme/Allergies:  Does not bruise/bleed easily.  Psychiatric/Behavioral:  Negative for depression and suicidal ideas. The patient is nervous/anxious. The patient does not have insomnia.      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    Vitamin D deficiency     Past Surgical History:  Procedure Laterality Date   ABDOMINAL HYSTERECTOMY  1995   APPENDECTOMY  1994   BREAST BIOPSY Right 1998   neg   BREAST BIOPSY Left 05/30/2020   stere bx x clip path pending   BREAST LUMPECTOMY WITH SENTINEL LYMPH NODE BIOPSY Left 06/29/2020   Procedure: BREAST LUMPECTOMY WITH SENTINEL LYMPH NODE BX;  Surgeon: Robert Bellow, MD;  Location: [facility] ORS;  Service: General;  Laterality: Left;   ESI     LUMBAR MICRODISCECTOMY  2019   L3-4   LUMBAR MICRODISCECTOMY  2008   L4-L5 repeated   MICRODISCECTOMY LUMBAR  2008   L4-5   MICRODISCECTOMY LUMBAR  2010   L4-L5 repeated   NODE DISSECTION Left 08/13/2020   Procedure: AXILLARY NODE DISSECTION;  Surgeon: Robert Bellow, MD;  Location: [facility] ORS;  Service: General;  Laterality: Left;   PORTACATH PLACEMENT Right 09/05/2020   Procedure: INSERTION PORT-A-CATH;   Surgeon: Robert Bellow, MD;  Location: [facility] ORS;  Service: General;  Laterality: Right;  right; monitor anesthesia care   SQUAMOUS CELL CARCINOMA EXCISION     TONSILLECTOMY  1971    Social History   Socioeconomic History   Marital status: Widowed    Spouse name: Not on file   Number of children: 1   Years of education: Not on file   Highest education level: Bachelor's degree (e.g., BA, AB, BS)  Occupational History   Occupation: retired  Tobacco Use   Smoking status: Former    Packs/day: 0.91    Years: 33.00    Pack years: 30.03    Types: Cigarettes    Quit date: 04/19/2018    Years since quitting: 2.6   Smokeless tobacco: Never  Vaping Use   Vaping Use: Never used  Substance and Sexual Activity   Alcohol use: Yes    Comment: rarely   Drug use: No   Sexual activity: Not Currently  Other Topics Concern   Not on file  Social History Narrative   Not on file   Social Determinants of Health   Financial Resource Strain: Not on file  Food Insecurity: Not on file  Transportation Needs: Not on file  Physical Activity: Not on file  Stress: Not on file  Social Connections: Not on file  Intimate Partner Violence: Not on file    Family History  Problem Relation Age of Onset   Breast cancer Mother 37   Osteosarcoma Mother        d. 41   Hypertension Brother    COPD Father    Hypertension Father    Breast cancer Paternal Aunt        dx 83s   Breast cancer Paternal Grandmother        dx 44s   Stomach cancer Paternal Grandfather        dx 66s   Breast cancer Paternal Aunt        dx 68s   Brain cancer Paternal Aunt        dx 59s    Current Outpatient Medications:    acetaminophen (TYLENOL) 650 MG CR tablet, Take 1,300 mg by mouth every 8 (eight) hours as needed for pain., Disp: , Rfl:    Biotin 1000 MCG tablet, Take 1,000 mcg by mouth daily., Disp: , Rfl:    cholecalciferol (VITAMIN D) 25 MCG (1000 UNIT) tablet, Take 1,000 Units by mouth daily., Disp: , Rfl:     diazepam (VALIUM) 5 MG tablet, Take 1 tablet (5 mg total) by mouth every 12 (twelve) hours as needed for anxiety., Disp: 60 tablet, Rfl: 5   hydrocortisone 2.5 % lotion, Apply 1 application topically daily as needed (when washing face)., Disp: , Rfl:    lidocaine-prilocaine (EMLA) cream, Apply to affected area once, Disp: 30 g, Rfl: 3   losartan (COZAAR) 100 MG tablet, TAKE 1 TABLET (100 MG TOTAL) BY MOUTH DAILY., Disp: 90 tablet, Rfl: 1   Menthol, Topical Analgesic, (BIOFREEZE EX), Apply 1 application topically daily as needed (back pain)., Disp: , Rfl:    mometasone (ELOCON) 0.1 % cream, Apply 1 application topically daily as needed (ear irritation)., Disp: , Rfl:    rosuvastatin (CRESTOR) 5 MG tablet, TAKE 1 TABLET (5 MG TOTAL) BY MOUTH DAILY., Disp: 90 tablet, Rfl: 1   triamterene-hydrochlorothiazide (DYAZIDE) 37.5-25 MG capsule, Take 1 each (1 capsule total) by mouth daily., Disp: 90 capsule, Rfl: 4   venlafaxine XR (EFFEXOR XR) 75 MG 24 hr capsule, Take 3 capsules (225 mg total) by mouth daily with breakfast., Disp: 270 capsule, Rfl: 1   ondansetron (ZOFRAN) 8 MG tablet, Take 1 tablet (8 mg total) by mouth 2 (two) times daily as needed (Nausea or vomiting). (Patient not taking: No sig reported), Disp: 30 tablet, Rfl: 1   Polyethyl Glycol-Propyl Glycol (SYSTANE OP), Place 1 drop into both eyes daily as needed (dry eyes). (Patient not taking: No sig reported), Disp: , Rfl:    prochlorperazine (COMPAZINE) 10 MG tablet, Take 1 tablet (10 mg total) by mouth every 6 (six) hours as needed (Nausea or vomiting). (Patient not taking: No sig reported), Disp: 30 tablet, Rfl: 1  Physical exam:  Vitals:   11/26/20 1049  BP: (!) 146/81  Pulse: 99  Resp: 20  Temp: 98.7 F (37.1 C)  SpO2: 96%  Weight: 238 lb 6.4 oz (108.1 kg)   Physical Exam Constitutional:      General: She is not in acute distress.    Appearance: She is well-developed. She is not ill-appearing.     Comments: Accompanied by  daughter  Eyes:     General: No scleral icterus. Cardiovascular:     Rate and Rhythm: Normal rate and regular rhythm.  Pulmonary:     Effort: Pulmonary effort is normal.     Breath sounds: Normal breath sounds.  Musculoskeletal:        General: No tenderness or deformity. Normal range of motion.     Cervical back: Normal range of motion and neck supple.  Skin:    General: Skin is warm and dry.     Coloration: Skin is not pale.  Neurological:     General: No focal deficit present.     Mental Status: She is alert and oriented to person, place, and time.  Psychiatric:        Mood and Affect: Mood normal.        Behavior: Behavior normal.     CMP Latest Ref Rng & Units 11/26/2020  Glucose 70 - 99 mg/dL 107(H)  BUN 8 - 23 mg/dL 24(H)  Creatinine 0.44 - 1.00 mg/dL 0.84  Sodium 135 - 145 mmol/L 137  Potassium 3.5 - 5.1 mmol/L 3.3(L)  Chloride 98 - 111 mmol/L 102  CO2 22 - 32 mmol/L 30  Calcium 8.9 - 10.3 mg/dL 8.4(L)  Total Protein 6.5 - 8.1 g/dL 6.4(L)  Total Bilirubin 0.3 - 1.2 mg/dL 0.6  Alkaline Phos 38 - 126 U/L 103  AST 15 - 41 U/L 21  ALT 0 - 44 U/L 26   CBC Latest Ref Rng & Units 11/26/2020  WBC 4.0 - 10.5 K/uL 5.8  Hemoglobin 12.0 - 15.0 g/dL 9.7(L)  Hematocrit 36.0 - 46.0 % 29.7(L)  Platelets 150 - 400 K/uL 294   No images are attached to the encounter.  No results found.  Assessment and plan-   1.  Pathological prognostic stage Ib invasive lobular carcinoma of the left breast-pT3 pN2 cM0 ER/PR positive HER2/neu negative.  Labs reviewed and acceptable for continuation of treatment.  Proceed with cycle 12 of weekly Taxol chemotherapy.  This will be her last adjuvant chemotherapy cycle.  She has met with radiation oncology and will receive 28 fractions of adjuvant radiation.  Given that her tumor is ER/PR positive hormone therapy is indicated.  We discussed that given that she is postmenopausal recommendation is for at least 5 years of adjuvant hormone therapy with  aromatase inhibitor.  Generally we start with letrozole.  She has a baseline bone density scan scheduled for early November.  I again recommended that she start calcium 1200 mg along with vitamin D 800 IU daily along with weightbearing exercise as tolerated.  We will plan to see her back after she completes radiation for discussion of bone density and to start letrozole. Ki-67 was reported on 11/20/2020.  I reviewed report which was negative.  Therefore, there is no role for adjuvant abemaciclib.   Given that she is has a high risk breast cancer that warranted chemotherapy, Dr. Janese Banks also recommended adjuvant bisphosphonates.  Zometa was recommended every 6 months for up to 3 years to reduce the risk of skeletal fractures.  I encouraged her to see her dentist for dental clearance so that we can consider starting zometa in the future.   She will receive b12 today.  She would like to give herself B12 injections at home and her daughter will be helping her with administration.  We will have nursing assist with teaching today.  Prescription for home dose sent to her pharmacy.  Her potassium is a bit low at 3.3.  Of asked her to start oral potassium 20 mEq daily for 1 month.  We will check labs at next visit to  see if this needs to be continued.  Treatment today 6-8 weeks lab, Janese Banks   Visit Diagnosis 1. Encounter for antineoplastic chemotherapy   2. B12 deficiency   3. Hypokalemia   4. Malignant neoplasm of upper-outer quadrant of left breast in female, estrogen receptor positive ([place])    Beckey Rutter, [place], AGNP-C [place] at [facility] [phone] (clinic) 11/26/2020

## 2020-11-27 ENCOUNTER — Encounter: Payer: Self-pay | Admitting: Oncology

## 2020-11-27 NOTE — Patient Instructions (Signed)
Please contact your dentist to obtain dental clearance so that we can consider starting Zometa at your next appointment.  I've sent prescription for B12 injections. Please pick up medication from your pharmacy.

## 2020-11-29 ENCOUNTER — Ambulatory Visit: Payer: Medicare PPO | Admitting: Family Medicine

## 2020-11-29 ENCOUNTER — Encounter: Payer: Self-pay | Admitting: Family Medicine

## 2020-11-29 ENCOUNTER — Other Ambulatory Visit: Payer: Self-pay

## 2020-11-29 ENCOUNTER — Telehealth: Payer: Self-pay | Admitting: *Deleted

## 2020-11-29 VITALS — BP 120/66 | HR 113 | Temp 97.5°F | Resp 18 | Ht 63.0 in | Wt 238.0 lb

## 2020-11-29 DIAGNOSIS — E66812 Obesity, class 2: Secondary | ICD-10-CM

## 2020-11-29 DIAGNOSIS — I10 Essential (primary) hypertension: Secondary | ICD-10-CM | POA: Diagnosis not present

## 2020-11-29 DIAGNOSIS — F411 Generalized anxiety disorder: Secondary | ICD-10-CM | POA: Diagnosis not present

## 2020-11-29 DIAGNOSIS — F330 Major depressive disorder, recurrent, mild: Secondary | ICD-10-CM | POA: Diagnosis not present

## 2020-11-29 DIAGNOSIS — Z170 Estrogen receptor positive status [ER+]: Secondary | ICD-10-CM

## 2020-11-29 DIAGNOSIS — G4733 Obstructive sleep apnea (adult) (pediatric): Secondary | ICD-10-CM | POA: Diagnosis not present

## 2020-11-29 DIAGNOSIS — Z23 Encounter for immunization: Secondary | ICD-10-CM

## 2020-11-29 DIAGNOSIS — Z87891 Personal history of nicotine dependence: Secondary | ICD-10-CM

## 2020-11-29 DIAGNOSIS — Z6839 Body mass index (BMI) 39.0-39.9, adult: Secondary | ICD-10-CM

## 2020-11-29 DIAGNOSIS — C50412 Malignant neoplasm of upper-outer quadrant of left female breast: Secondary | ICD-10-CM | POA: Diagnosis not present

## 2020-11-29 NOTE — Progress Notes (Signed)
I,[name],acting as a scribe for [name], MD.,have documented all relevant documentation on the behalf of [name], MD,as directed by  [name], MD while in the presence of [name], MD.   Established patient visit   Patient: Victoria Dean   DOB: Feb 29, 1950   69 y.o. Female  MRN: [id number] Visit Date: 11/29/2020  Today's healthcare provider: [name], MD   Chief Complaint  Patient presents with   Follow-up   Depression   Subjective    HPI  Patient has chemotherapy and radiation therapy for her breast cancer.  She just finished her last dose of chemotherapy 3 days ago and start radiation therapy next week. She is very tired but otherwise is doing well.  Mostly she is hanging in there and doing well and enjoying her granddaughter. Depression, Follow-up  She  was last seen for this 3 months ago. Changes made at last visit include none, however, prior visit Effexor was increased and patient was improving.   She reports good compliance with treatment. She is not having side effects. none  She reports good tolerance of treatment. Current symptoms include: depressed mood, fatigue, and weight gain She feels she is Unchanged since last visit.  Depression screen [facility] 2/9 11/29/2020 08/27/2020 06/25/2020  Decreased Interest 1 1 2   Down, Depressed, Hopeless 0 1 2  PHQ - 2 Score 1 2 4   Altered sleeping 0 0 2  Tired, decreased energy 3 1 3   Change in appetite 1 2 2   Feeling bad or failure about yourself  0 0 1  Trouble concentrating 0 0 0  Moving slowly or fidgety/restless 0 0 0  Suicidal thoughts 0 0 0  PHQ-9 Score 5 5 12   Difficult doing work/chores Not difficult at all Somewhat difficult Very difficult  Some recent data might be hidden    -----------------------------------------------------------------------------------------     Medications: Outpatient Medications Prior to Visit  Medication Sig   acetaminophen  (TYLENOL) 650 MG CR tablet Take 1,300 mg by mouth every 8 (eight) hours as needed for pain.   Biotin 1000 MCG tablet Take 1,000 mcg by mouth daily.   cholecalciferol (VITAMIN D) 25 MCG (1000 UNIT) tablet Take 1,000 Units by mouth daily.   cyanocobalamin (,VITAMIN B-12,) 1000 MCG/ML injection Inject 1 mL (1,000 mcg total) into the muscle every 30 (thirty) days.   diazepam (VALIUM) 5 MG tablet Take 1 tablet (5 mg total) by mouth every 12 (twelve) hours as needed for anxiety.   hydrocortisone 2.5 % lotion Apply 1 application topically daily as needed (when washing face).   lidocaine-prilocaine (EMLA) cream Apply to affected area once   losartan (COZAAR) 100 MG tablet TAKE 1 TABLET (100 MG TOTAL) BY MOUTH DAILY.   Menthol, Topical Analgesic, (BIOFREEZE EX) Apply 1 application topically daily as needed (back pain).   mometasone (ELOCON) 0.1 % cream Apply 1 application topically daily as needed (ear irritation).   ondansetron (ZOFRAN) 8 MG tablet Take 1 tablet (8 mg total) by mouth 2 (two) times daily as needed (Nausea or vomiting).   Polyethyl Glycol-Propyl Glycol (SYSTANE OP) Place 1 drop into both eyes daily as needed (dry eyes).   potassium chloride SA (KLOR-CON) 20 MEQ tablet Take 1 tablet (20 mEq total) by mouth daily.   prochlorperazine (COMPAZINE) 10 MG tablet Take 1 tablet (10 mg total) by mouth every 6 (six) hours as needed (Nausea or vomiting).   rosuvastatin (CRESTOR) 5 MG tablet TAKE 1  TABLET (5 MG TOTAL) BY MOUTH DAILY.   triamterene-hydrochlorothiazide (DYAZIDE) 37.5-25 MG capsule Take 1 each (1 capsule total) by mouth daily.   venlafaxine XR (EFFEXOR XR) 75 MG 24 hr capsule Take 3 capsules (225 mg total) by mouth daily with breakfast.   No facility-administered medications prior to visit.    Review of Systems  Constitutional:  Negative for appetite change, chills, fatigue and fever.  Respiratory:  Negative for chest tightness and shortness of breath.   Cardiovascular:  Negative for  chest pain and palpitations.  Gastrointestinal:  Negative for abdominal pain, nausea and vomiting.  Neurological:  Negative for dizziness and weakness.       Objective    BP 120/66 (BP Location: Right Arm, Patient Position: Sitting, Cuff Size: Large)   Pulse (!) 113   Temp (!) 97.5 F (36.4 C) (Temporal)   Resp 18   Ht 5\' 3"  (1.6 m)   Wt 238 lb (108 kg)   SpO2 95%   BMI 42.16 kg/m  BP Readings from Last 3 Encounters:  11/29/20 120/66  11/26/20 (!) 146/81  11/19/20 134/74   Wt Readings from Last 3 Encounters:  11/29/20 238 lb (108 kg)  11/26/20 238 lb 6.4 oz (108.1 kg)  11/19/20 236 lb (107 kg)      Physical Exam Vitals reviewed.  Constitutional:      General: She is not in acute distress.    Appearance: She is well-developed.  HENT:     Head: Normocephalic and atraumatic.     Right Ear: Hearing normal.     Left Ear: Hearing normal.     Nose: Nose normal.  Eyes:     General: Lids are normal. No scleral icterus.       Right eye: No discharge.        Left eye: No discharge.     Conjunctiva/sclera: Conjunctivae normal.  Cardiovascular:     Rate and Rhythm: Normal rate and regular rhythm.     Heart sounds: Normal heart sounds.  Pulmonary:     Effort: Pulmonary effort is normal. No respiratory distress.  Skin:    Findings: No lesion or rash.  Neurological:     General: No focal deficit present.     Mental Status: She is alert and oriented to person, place, and time.  Psychiatric:        Mood and Affect: Mood normal.        Speech: Speech normal.        Behavior: Behavior normal.        Thought Content: Thought content normal.        Judgment: Judgment normal.      No results found for any visits on 11/29/20.  Assessment & Plan     1. Mild episode of recurrent major depressive disorder ([place]) In partial remission on Effexor to 25 daily.  Continue this.  Follow-up neck spring after she is through with radiation therapy  2. Need for influenza  vaccination  - Flu Vaccine QUAD High Dose(Fluad)  3. Essential (primary) hypertension Excellent control  4. Malignant neoplasm of upper-outer quadrant of left breast in female, estrogen receptor positive ([place]) Has finished chemotherapy, has had lumpectomy and next week starts radiation therapy  5. Class 2 severe obesity due to excess calories with serious comorbidity and body mass index (BMI) of 39.0 to 39.9 in adult [facility]) Diet and exercise stressed.  6. Anxiety, generalized Clinically stable along with her depression  7. Obstructive sleep apnea syndrome  8. Personal history of tobacco use, presenting hazards to health Patient quit smoking several years ago but is pursing her lips today and may need pulmonary referral going forward.  She denies any difficulty breathing but I think she has some emphysema that is starting to announce itself.  Again as she finishes radiation may refer to pulmonary  Return in about 6 months (around 05/30/2021).      I, [name], MD, have reviewed all documentation for this visit. The documentation on 12/04/20 for the exam, diagnosis, procedures, and orders are all accurate and complete.    Rayn Enderson Cranford Mon, MD  [facility] [phone] (phone) [phone] (fax)  [place]

## 2020-11-29 NOTE — Telephone Encounter (Signed)
Asking if it is OK for her to take the [employer] Brand of Vapor Ice a cold and flu medicine since she has caught a cold from her granddaughter, She states it has Tylenol, nasal decongestant, cough medicine, antihistamine in it. Please Arnoldo Hooker

## 2020-11-29 NOTE — Telephone Encounter (Signed)
Patient informed OK to take Vapor Ice

## 2020-12-03 ENCOUNTER — Telehealth: Payer: Self-pay | Admitting: *Deleted

## 2020-12-03 ENCOUNTER — Ambulatory Visit: Payer: Medicare PPO

## 2020-12-03 NOTE — Telephone Encounter (Signed)
OTC cough syrup and tylenol should suffice. If she develops a fever call us

## 2020-12-03 NOTE — Telephone Encounter (Signed)
I called patient and spoke with her and advised of doctor response to take over the counter cough medicine and use tylenol and to call is she develops a fever. She agreed with this

## 2020-12-03 NOTE — Telephone Encounter (Signed)
Patient called reporting that she has a URI and asking what she can take over the counter or if she needs to be seen. She has no fever (highest was Saturday at 99.2), head congestion is going into her lungs, feels like she is starting to get bronchitis, eyes watering, nose running. Coughing up a gray green sputum. Unable to sleep. Symptoms started Friday after getting the flu shot Thursday.  Please advise

## 2020-12-03 NOTE — Telephone Encounter (Signed)
Attempted to call patient back twice, Line busy and unable to leave a message. Will try again later

## 2020-12-05 ENCOUNTER — Other Ambulatory Visit: Payer: Medicare PPO

## 2020-12-05 ENCOUNTER — Inpatient Hospital Stay: Admission: RE | Admit: 2020-12-05 | Payer: Medicare PPO | Source: Ambulatory Visit

## 2020-12-05 ENCOUNTER — Telehealth: Payer: Self-pay | Admitting: *Deleted

## 2020-12-05 NOTE — Telephone Encounter (Signed)
Call returned to patient and informed that Victoria Dean is fine. She thanked me for letting her know

## 2020-12-05 NOTE — Telephone Encounter (Signed)
Patient had to cancel her BMD due to being sick an dshe states Victoria Dean was told the next available appointment is not until [name] 9,2023. Victoria Dean states Dr Janese Banks wanted this done ASAP and did not know what to do so Victoria Dean accepted the 1/9 appointment. Please advise of what Victoria Dean is to do.

## 2020-12-05 NOTE — Telephone Encounter (Signed)
Ok to do it in [place]

## 2020-12-06 ENCOUNTER — Ambulatory Visit: Payer: Medicare PPO

## 2020-12-10 ENCOUNTER — Ambulatory Visit: Payer: Medicare PPO

## 2020-12-11 ENCOUNTER — Ambulatory Visit: Payer: Medicare PPO

## 2020-12-12 ENCOUNTER — Ambulatory Visit: Payer: Medicare PPO

## 2020-12-13 ENCOUNTER — Ambulatory Visit: Payer: Medicare PPO

## 2020-12-13 ENCOUNTER — Ambulatory Visit
Admission: RE | Admit: 2020-12-13 | Discharge: 2020-12-13 | Disposition: A | Discharge status: Home / Self Care | Payer: Medicare PPO | Source: Ambulatory Visit | Attending: Radiation Oncology | Admitting: Radiation Oncology

## 2020-12-13 DIAGNOSIS — Z510 Encounter for antineoplastic radiation therapy: Secondary | ICD-10-CM | POA: Insufficient documentation

## 2020-12-13 DIAGNOSIS — Z170 Estrogen receptor positive status [ER+]: Secondary | ICD-10-CM | POA: Insufficient documentation

## 2020-12-13 DIAGNOSIS — C50412 Malignant neoplasm of upper-outer quadrant of left female breast: Secondary | ICD-10-CM | POA: Diagnosis not present

## 2020-12-14 ENCOUNTER — Ambulatory Visit: Payer: Medicare PPO

## 2020-12-14 ENCOUNTER — Other Ambulatory Visit: Payer: Self-pay | Admitting: *Deleted

## 2020-12-14 DIAGNOSIS — Z170 Estrogen receptor positive status [ER+]: Secondary | ICD-10-CM

## 2020-12-14 DIAGNOSIS — C50412 Malignant neoplasm of upper-outer quadrant of left female breast: Secondary | ICD-10-CM

## 2020-12-17 ENCOUNTER — Ambulatory Visit: Payer: Medicare PPO

## 2020-12-18 ENCOUNTER — Ambulatory Visit: Payer: Medicare PPO

## 2020-12-18 DIAGNOSIS — C50412 Malignant neoplasm of upper-outer quadrant of left female breast: Secondary | ICD-10-CM | POA: Diagnosis not present

## 2020-12-19 ENCOUNTER — Ambulatory Visit: Payer: Medicare PPO

## 2020-12-19 DIAGNOSIS — Z170 Estrogen receptor positive status [ER+]: Secondary | ICD-10-CM | POA: Diagnosis not present

## 2020-12-19 DIAGNOSIS — C50412 Malignant neoplasm of upper-outer quadrant of left female breast: Secondary | ICD-10-CM | POA: Diagnosis not present

## 2020-12-19 DIAGNOSIS — Z510 Encounter for antineoplastic radiation therapy: Secondary | ICD-10-CM | POA: Diagnosis not present

## 2020-12-20 ENCOUNTER — Ambulatory Visit: Payer: Medicare PPO

## 2020-12-20 ENCOUNTER — Ambulatory Visit: Admission: RE | Admit: 2020-12-20 | Payer: Medicare PPO | Source: Ambulatory Visit

## 2020-12-20 DIAGNOSIS — Z510 Encounter for antineoplastic radiation therapy: Secondary | ICD-10-CM | POA: Diagnosis not present

## 2020-12-20 DIAGNOSIS — C50412 Malignant neoplasm of upper-outer quadrant of left female breast: Secondary | ICD-10-CM | POA: Diagnosis not present

## 2020-12-20 DIAGNOSIS — Z170 Estrogen receptor positive status [ER+]: Secondary | ICD-10-CM | POA: Diagnosis not present

## 2020-12-21 ENCOUNTER — Ambulatory Visit: Payer: Medicare PPO

## 2020-12-24 ENCOUNTER — Ambulatory Visit
Admission: RE | Admit: 2020-12-24 | Discharge: 2020-12-24 | Disposition: A | Discharge status: Home / Self Care | Payer: Medicare PPO | Source: Ambulatory Visit | Attending: Radiation Oncology | Admitting: Radiation Oncology

## 2020-12-24 ENCOUNTER — Ambulatory Visit: Payer: Medicare PPO

## 2020-12-24 DIAGNOSIS — Z510 Encounter for antineoplastic radiation therapy: Secondary | ICD-10-CM | POA: Diagnosis not present

## 2020-12-24 DIAGNOSIS — C50412 Malignant neoplasm of upper-outer quadrant of left female breast: Secondary | ICD-10-CM | POA: Diagnosis not present

## 2020-12-24 DIAGNOSIS — Z170 Estrogen receptor positive status [ER+]: Secondary | ICD-10-CM | POA: Diagnosis not present

## 2020-12-25 ENCOUNTER — Ambulatory Visit: Payer: Medicare PPO

## 2020-12-25 ENCOUNTER — Ambulatory Visit
Admission: RE | Admit: 2020-12-25 | Discharge: 2020-12-25 | Disposition: A | Discharge status: Home / Self Care | Payer: Medicare PPO | Source: Ambulatory Visit | Attending: Radiation Oncology | Admitting: Radiation Oncology

## 2020-12-25 DIAGNOSIS — Z170 Estrogen receptor positive status [ER+]: Secondary | ICD-10-CM | POA: Diagnosis not present

## 2020-12-25 DIAGNOSIS — C50412 Malignant neoplasm of upper-outer quadrant of left female breast: Secondary | ICD-10-CM | POA: Diagnosis not present

## 2020-12-25 DIAGNOSIS — Z510 Encounter for antineoplastic radiation therapy: Secondary | ICD-10-CM | POA: Diagnosis not present

## 2020-12-26 ENCOUNTER — Ambulatory Visit: Payer: Medicare PPO

## 2020-12-26 ENCOUNTER — Ambulatory Visit
Admission: RE | Admit: 2020-12-26 | Discharge: 2020-12-26 | Disposition: A | Discharge status: Home / Self Care | Payer: Medicare PPO | Source: Ambulatory Visit | Attending: Radiation Oncology | Admitting: Radiation Oncology

## 2020-12-26 DIAGNOSIS — Z170 Estrogen receptor positive status [ER+]: Secondary | ICD-10-CM | POA: Diagnosis not present

## 2020-12-26 DIAGNOSIS — C50412 Malignant neoplasm of upper-outer quadrant of left female breast: Secondary | ICD-10-CM | POA: Diagnosis not present

## 2020-12-26 DIAGNOSIS — Z510 Encounter for antineoplastic radiation therapy: Secondary | ICD-10-CM | POA: Diagnosis not present

## 2020-12-31 ENCOUNTER — Ambulatory Visit: Payer: Medicare PPO

## 2020-12-31 ENCOUNTER — Ambulatory Visit
Admission: RE | Admit: 2020-12-31 | Discharge: 2020-12-31 | Disposition: A | Discharge status: Home / Self Care | Payer: Medicare PPO | Source: Ambulatory Visit | Attending: Radiation Oncology | Admitting: Radiation Oncology

## 2020-12-31 DIAGNOSIS — Z510 Encounter for antineoplastic radiation therapy: Secondary | ICD-10-CM | POA: Diagnosis not present

## 2020-12-31 DIAGNOSIS — C50412 Malignant neoplasm of upper-outer quadrant of left female breast: Secondary | ICD-10-CM | POA: Diagnosis not present

## 2020-12-31 DIAGNOSIS — Z170 Estrogen receptor positive status [ER+]: Secondary | ICD-10-CM | POA: Diagnosis not present

## 2021-01-01 ENCOUNTER — Ambulatory Visit: Payer: Medicare PPO

## 2021-01-01 ENCOUNTER — Ambulatory Visit
Admission: RE | Admit: 2021-01-01 | Discharge: 2021-01-01 | Disposition: A | Discharge status: Home / Self Care | Payer: Medicare PPO | Source: Ambulatory Visit | Attending: Radiation Oncology | Admitting: Radiation Oncology

## 2021-01-01 DIAGNOSIS — Z510 Encounter for antineoplastic radiation therapy: Secondary | ICD-10-CM | POA: Diagnosis not present

## 2021-01-01 DIAGNOSIS — C50412 Malignant neoplasm of upper-outer quadrant of left female breast: Secondary | ICD-10-CM | POA: Diagnosis not present

## 2021-01-01 DIAGNOSIS — Z170 Estrogen receptor positive status [ER+]: Secondary | ICD-10-CM | POA: Diagnosis not present

## 2021-01-02 ENCOUNTER — Ambulatory Visit
Admission: RE | Admit: 2021-01-02 | Discharge: 2021-01-02 | Disposition: A | Discharge status: Home / Self Care | Payer: Medicare PPO | Source: Ambulatory Visit | Attending: Radiation Oncology | Admitting: Radiation Oncology

## 2021-01-02 ENCOUNTER — Ambulatory Visit: Payer: Medicare PPO

## 2021-01-02 DIAGNOSIS — Z170 Estrogen receptor positive status [ER+]: Secondary | ICD-10-CM | POA: Diagnosis not present

## 2021-01-02 DIAGNOSIS — C50412 Malignant neoplasm of upper-outer quadrant of left female breast: Secondary | ICD-10-CM | POA: Diagnosis not present

## 2021-01-02 DIAGNOSIS — Z510 Encounter for antineoplastic radiation therapy: Secondary | ICD-10-CM | POA: Diagnosis not present

## 2021-01-03 ENCOUNTER — Ambulatory Visit: Payer: Medicare PPO

## 2021-01-03 ENCOUNTER — Ambulatory Visit
Admission: RE | Admit: 2021-01-03 | Discharge: 2021-01-03 | Disposition: A | Discharge status: Home / Self Care | Payer: Medicare PPO | Source: Ambulatory Visit | Attending: Radiation Oncology | Admitting: Radiation Oncology

## 2021-01-03 DIAGNOSIS — Z170 Estrogen receptor positive status [ER+]: Secondary | ICD-10-CM | POA: Insufficient documentation

## 2021-01-03 DIAGNOSIS — C773 Secondary and unspecified malignant neoplasm of axilla and upper limb lymph nodes: Secondary | ICD-10-CM | POA: Insufficient documentation

## 2021-01-03 DIAGNOSIS — Z510 Encounter for antineoplastic radiation therapy: Secondary | ICD-10-CM | POA: Insufficient documentation

## 2021-01-03 DIAGNOSIS — C50412 Malignant neoplasm of upper-outer quadrant of left female breast: Secondary | ICD-10-CM | POA: Diagnosis not present

## 2021-01-04 ENCOUNTER — Ambulatory Visit: Payer: Medicare PPO

## 2021-01-04 ENCOUNTER — Other Ambulatory Visit: Payer: Self-pay

## 2021-01-04 ENCOUNTER — Ambulatory Visit
Admission: RE | Admit: 2021-01-04 | Discharge: 2021-01-04 | Disposition: A | Discharge status: Home / Self Care | Payer: Medicare PPO | Source: Ambulatory Visit | Attending: Radiation Oncology | Admitting: Radiation Oncology

## 2021-01-04 ENCOUNTER — Inpatient Hospital Stay: Payer: Medicare PPO

## 2021-01-04 DIAGNOSIS — Z170 Estrogen receptor positive status [ER+]: Secondary | ICD-10-CM | POA: Diagnosis not present

## 2021-01-04 DIAGNOSIS — Z510 Encounter for antineoplastic radiation therapy: Secondary | ICD-10-CM | POA: Insufficient documentation

## 2021-01-04 DIAGNOSIS — C773 Secondary and unspecified malignant neoplasm of axilla and upper limb lymph nodes: Secondary | ICD-10-CM | POA: Diagnosis not present

## 2021-01-04 DIAGNOSIS — C50412 Malignant neoplasm of upper-outer quadrant of left female breast: Secondary | ICD-10-CM | POA: Diagnosis not present

## 2021-01-04 LAB — CBC
HCT: 35.6 % — ABNORMAL LOW (ref 36.0–46.0)
Hemoglobin: 11.3 g/dL — ABNORMAL LOW (ref 12.0–15.0)
MCH: 31.3 pg (ref 26.0–34.0)
MCHC: 31.7 g/dL (ref 30.0–36.0)
MCV: 98.6 fL (ref 80.0–100.0)
Platelets: 252 10*3/uL (ref 150–400)
RBC: 3.61 MIL/uL — ABNORMAL LOW (ref 3.87–5.11)
RDW: 15.2 % (ref 11.5–15.5)
WBC: 6.4 10*3/uL (ref 4.0–10.5)
nRBC: 0 % (ref 0.0–0.2)

## 2021-01-07 ENCOUNTER — Ambulatory Visit: Payer: Medicare PPO

## 2021-01-07 ENCOUNTER — Ambulatory Visit
Admission: RE | Admit: 2021-01-07 | Discharge: 2021-01-07 | Disposition: A | Discharge status: Home / Self Care | Payer: Medicare PPO | Source: Ambulatory Visit | Attending: Radiation Oncology | Admitting: Radiation Oncology

## 2021-01-07 DIAGNOSIS — C773 Secondary and unspecified malignant neoplasm of axilla and upper limb lymph nodes: Secondary | ICD-10-CM | POA: Diagnosis not present

## 2021-01-07 DIAGNOSIS — Z510 Encounter for antineoplastic radiation therapy: Secondary | ICD-10-CM | POA: Diagnosis not present

## 2021-01-07 DIAGNOSIS — Z170 Estrogen receptor positive status [ER+]: Secondary | ICD-10-CM | POA: Diagnosis not present

## 2021-01-07 DIAGNOSIS — C50412 Malignant neoplasm of upper-outer quadrant of left female breast: Secondary | ICD-10-CM | POA: Diagnosis not present

## 2021-01-08 ENCOUNTER — Ambulatory Visit
Admission: RE | Admit: 2021-01-08 | Discharge: 2021-01-08 | Disposition: A | Discharge status: Home / Self Care | Payer: Medicare PPO | Source: Ambulatory Visit | Attending: Radiation Oncology | Admitting: Radiation Oncology

## 2021-01-08 ENCOUNTER — Ambulatory Visit: Payer: Medicare PPO

## 2021-01-08 DIAGNOSIS — Z510 Encounter for antineoplastic radiation therapy: Secondary | ICD-10-CM | POA: Diagnosis not present

## 2021-01-08 DIAGNOSIS — Z170 Estrogen receptor positive status [ER+]: Secondary | ICD-10-CM | POA: Diagnosis not present

## 2021-01-08 DIAGNOSIS — C773 Secondary and unspecified malignant neoplasm of axilla and upper limb lymph nodes: Secondary | ICD-10-CM | POA: Diagnosis not present

## 2021-01-08 DIAGNOSIS — C50412 Malignant neoplasm of upper-outer quadrant of left female breast: Secondary | ICD-10-CM | POA: Diagnosis not present

## 2021-01-09 ENCOUNTER — Ambulatory Visit
Admission: RE | Admit: 2021-01-09 | Discharge: 2021-01-09 | Disposition: A | Discharge status: Home / Self Care | Payer: Medicare PPO | Source: Ambulatory Visit | Attending: Radiation Oncology | Admitting: Radiation Oncology

## 2021-01-09 ENCOUNTER — Ambulatory Visit: Payer: Medicare PPO

## 2021-01-09 DIAGNOSIS — C773 Secondary and unspecified malignant neoplasm of axilla and upper limb lymph nodes: Secondary | ICD-10-CM | POA: Diagnosis not present

## 2021-01-09 DIAGNOSIS — C50412 Malignant neoplasm of upper-outer quadrant of left female breast: Secondary | ICD-10-CM | POA: Diagnosis not present

## 2021-01-09 DIAGNOSIS — Z170 Estrogen receptor positive status [ER+]: Secondary | ICD-10-CM | POA: Diagnosis not present

## 2021-01-09 DIAGNOSIS — Z510 Encounter for antineoplastic radiation therapy: Secondary | ICD-10-CM | POA: Diagnosis not present

## 2021-01-10 ENCOUNTER — Other Ambulatory Visit: Payer: Self-pay | Admitting: Family Medicine

## 2021-01-10 ENCOUNTER — Ambulatory Visit
Admission: RE | Admit: 2021-01-10 | Discharge: 2021-01-10 | Disposition: A | Discharge status: Home / Self Care | Payer: Medicare PPO | Source: Ambulatory Visit | Attending: Radiation Oncology | Admitting: Radiation Oncology

## 2021-01-10 ENCOUNTER — Ambulatory Visit: Payer: Medicare PPO

## 2021-01-10 DIAGNOSIS — F411 Generalized anxiety disorder: Secondary | ICD-10-CM

## 2021-01-10 DIAGNOSIS — Z510 Encounter for antineoplastic radiation therapy: Secondary | ICD-10-CM | POA: Diagnosis not present

## 2021-01-10 DIAGNOSIS — Z170 Estrogen receptor positive status [ER+]: Secondary | ICD-10-CM | POA: Diagnosis not present

## 2021-01-10 DIAGNOSIS — C50412 Malignant neoplasm of upper-outer quadrant of left female breast: Secondary | ICD-10-CM | POA: Diagnosis not present

## 2021-01-10 DIAGNOSIS — C773 Secondary and unspecified malignant neoplasm of axilla and upper limb lymph nodes: Secondary | ICD-10-CM | POA: Diagnosis not present

## 2021-01-10 NOTE — Telephone Encounter (Signed)
Requested Prescriptions  Pending Prescriptions Disp Refills  . venlafaxine XR (EFFEXOR-XR) 75 MG 24 hr capsule [Pharmacy Med Name: VENLAFAXINE HYDROCHLORIDEER 75 MG Capsule Extended Release 24 Hour] 270 capsule 1    Sig: TAKE 3 CAPSULES (225 MG TOTAL) BY MOUTH DAILY WITH BREAKFAST.     Psychiatry: Antidepressants - SNRI - desvenlafaxine & venlafaxine Passed - 01/10/2021  3:55 AM      Passed - LDL in normal range and within 360 days    LDL Chol Calc (NIH)  Date Value Ref Range Status  02/23/2020 77 0 - 99 mg/dL Final         Passed - Total Cholesterol in normal range and within 360 days    Cholesterol, Total  Date Value Ref Range Status  02/23/2020 151 100 - 199 mg/dL Final         Passed - Triglycerides in normal range and within 360 days    Triglycerides  Date Value Ref Range Status  02/23/2020 119 0 - 149 mg/dL Final         Passed - Completed PHQ-2 or PHQ-9 in the last 360 days      Passed - Last BP in normal range    BP Readings from Last 1 Encounters:  11/29/20 120/66         Passed - Valid encounter within last 6 months    Recent Outpatient Visits          1 month ago Mild episode of recurrent major depressive disorder [facility])   [facility] Jerrol Banana., MD   4 months ago Malignant neoplasm of upper-outer quadrant of left breast in female, estrogen receptor positive [facility])   [facility] Jerrol Banana., MD   6 months ago Malignant neoplasm of upper-outer quadrant of left breast in female, estrogen receptor positive [facility])   [facility] Jerrol Banana., MD   8 months ago Breast lump on left side at 2 o'clock position   [facility] Rosanna Randy, Retia Passe., MD   10 months ago Essential (primary) hypertension   [facility] Jerrol Banana., MD      Future Appointments            In 4 months Jerrol Banana., MD [facility]

## 2021-01-11 ENCOUNTER — Ambulatory Visit: Payer: Medicare PPO

## 2021-01-11 ENCOUNTER — Ambulatory Visit
Admission: RE | Admit: 2021-01-11 | Discharge: 2021-01-11 | Disposition: A | Discharge status: Home / Self Care | Payer: Medicare PPO | Source: Ambulatory Visit | Attending: Radiation Oncology | Admitting: Radiation Oncology

## 2021-01-11 DIAGNOSIS — Z170 Estrogen receptor positive status [ER+]: Secondary | ICD-10-CM | POA: Diagnosis not present

## 2021-01-11 DIAGNOSIS — Z510 Encounter for antineoplastic radiation therapy: Secondary | ICD-10-CM | POA: Diagnosis not present

## 2021-01-11 DIAGNOSIS — C773 Secondary and unspecified malignant neoplasm of axilla and upper limb lymph nodes: Secondary | ICD-10-CM | POA: Diagnosis not present

## 2021-01-11 DIAGNOSIS — C50412 Malignant neoplasm of upper-outer quadrant of left female breast: Secondary | ICD-10-CM | POA: Diagnosis not present

## 2021-01-14 ENCOUNTER — Telehealth: Payer: Self-pay | Admitting: Oncology

## 2021-01-14 ENCOUNTER — Ambulatory Visit: Payer: Medicare PPO

## 2021-01-14 ENCOUNTER — Ambulatory Visit
Admission: RE | Admit: 2021-01-14 | Discharge: 2021-01-14 | Disposition: A | Discharge status: Home / Self Care | Payer: Medicare PPO | Source: Ambulatory Visit | Attending: Radiation Oncology | Admitting: Radiation Oncology

## 2021-01-14 DIAGNOSIS — Z510 Encounter for antineoplastic radiation therapy: Secondary | ICD-10-CM | POA: Diagnosis not present

## 2021-01-14 DIAGNOSIS — Z170 Estrogen receptor positive status [ER+]: Secondary | ICD-10-CM | POA: Diagnosis not present

## 2021-01-14 DIAGNOSIS — C773 Secondary and unspecified malignant neoplasm of axilla and upper limb lymph nodes: Secondary | ICD-10-CM | POA: Diagnosis not present

## 2021-01-14 DIAGNOSIS — C50412 Malignant neoplasm of upper-outer quadrant of left female breast: Secondary | ICD-10-CM | POA: Diagnosis not present

## 2021-01-14 NOTE — Telephone Encounter (Signed)
Pt called to reschedule her appt for 02-06-21. Please give her a call back at [phone]

## 2021-01-15 ENCOUNTER — Ambulatory Visit: Payer: Medicare PPO

## 2021-01-15 ENCOUNTER — Ambulatory Visit
Admission: RE | Admit: 2021-01-15 | Discharge: 2021-01-15 | Disposition: A | Discharge status: Home / Self Care | Payer: Medicare PPO | Source: Ambulatory Visit | Attending: Radiation Oncology | Admitting: Radiation Oncology

## 2021-01-15 DIAGNOSIS — C773 Secondary and unspecified malignant neoplasm of axilla and upper limb lymph nodes: Secondary | ICD-10-CM | POA: Diagnosis not present

## 2021-01-15 DIAGNOSIS — Z510 Encounter for antineoplastic radiation therapy: Secondary | ICD-10-CM | POA: Diagnosis not present

## 2021-01-15 DIAGNOSIS — C50412 Malignant neoplasm of upper-outer quadrant of left female breast: Secondary | ICD-10-CM | POA: Diagnosis not present

## 2021-01-15 DIAGNOSIS — Z170 Estrogen receptor positive status [ER+]: Secondary | ICD-10-CM | POA: Diagnosis not present

## 2021-01-16 ENCOUNTER — Ambulatory Visit
Admission: RE | Admit: 2021-01-16 | Discharge: 2021-01-16 | Disposition: A | Discharge status: Home / Self Care | Payer: Medicare PPO | Source: Ambulatory Visit | Attending: Radiation Oncology | Admitting: Radiation Oncology

## 2021-01-16 ENCOUNTER — Ambulatory Visit: Payer: Medicare PPO

## 2021-01-16 DIAGNOSIS — Z170 Estrogen receptor positive status [ER+]: Secondary | ICD-10-CM | POA: Diagnosis not present

## 2021-01-16 DIAGNOSIS — C50412 Malignant neoplasm of upper-outer quadrant of left female breast: Secondary | ICD-10-CM | POA: Diagnosis not present

## 2021-01-16 DIAGNOSIS — Z510 Encounter for antineoplastic radiation therapy: Secondary | ICD-10-CM | POA: Diagnosis not present

## 2021-01-16 DIAGNOSIS — C773 Secondary and unspecified malignant neoplasm of axilla and upper limb lymph nodes: Secondary | ICD-10-CM | POA: Diagnosis not present

## 2021-01-17 ENCOUNTER — Ambulatory Visit
Admission: RE | Admit: 2021-01-17 | Discharge: 2021-01-17 | Disposition: A | Discharge status: Home / Self Care | Payer: Medicare PPO | Source: Ambulatory Visit | Attending: Radiation Oncology | Admitting: Radiation Oncology

## 2021-01-17 ENCOUNTER — Ambulatory Visit: Payer: Self-pay

## 2021-01-17 ENCOUNTER — Ambulatory Visit: Payer: Medicare PPO

## 2021-01-17 ENCOUNTER — Ambulatory Visit: Payer: Medicare PPO | Admitting: Physician Assistant

## 2021-01-17 ENCOUNTER — Other Ambulatory Visit: Payer: Self-pay

## 2021-01-17 ENCOUNTER — Ambulatory Visit: Payer: Self-pay | Admitting: *Deleted

## 2021-01-17 ENCOUNTER — Encounter: Payer: Self-pay | Admitting: Physician Assistant

## 2021-01-17 VITALS — BP 116/73 | HR 96 | Temp 98.5°F | Resp 18 | Ht 63.0 in | Wt 235.0 lb

## 2021-01-17 DIAGNOSIS — J069 Acute upper respiratory infection, unspecified: Secondary | ICD-10-CM | POA: Diagnosis not present

## 2021-01-17 DIAGNOSIS — C773 Secondary and unspecified malignant neoplasm of axilla and upper limb lymph nodes: Secondary | ICD-10-CM | POA: Diagnosis not present

## 2021-01-17 DIAGNOSIS — Z170 Estrogen receptor positive status [ER+]: Secondary | ICD-10-CM | POA: Diagnosis not present

## 2021-01-17 DIAGNOSIS — C50412 Malignant neoplasm of upper-outer quadrant of left female breast: Secondary | ICD-10-CM | POA: Diagnosis not present

## 2021-01-17 DIAGNOSIS — Z510 Encounter for antineoplastic radiation therapy: Secondary | ICD-10-CM | POA: Diagnosis not present

## 2021-01-17 MED ORDER — BENZONATATE 100 MG PO CAPS: 100.0000 mg | ORAL_CAPSULE | Freq: Two times a day (BID) | ORAL | 0 refills | Status: AC | PRN

## 2021-01-17 NOTE — Telephone Encounter (Signed)
Patient called in stated that she was not feeling well is also a cancer patient that seem to have developed a sinus infection and is coughing up large wads of green goop. Please advise and call Ph# [phone]  Reason for Disposition  [1] MILD difficulty breathing (e.g., minimal/no SOB at rest, SOB with walking, pulse <100) AND [2] still present when not coughing  Answer Assessment - Initial Assessment Questions 1. ONSET: "When did the cough begin?"      48 hours 2. SEVERITY: "How bad is the cough today?"      Cough occurring during sleep 3. SPUTUM: "Describe the color of your sputum" (none, dry cough; clear, white, yellow, green)     green 4. HEMOPTYSIS: "Are you coughing up any blood?" If so ask: "How much?" (flecks, streaks, tablespoons, etc.)     no 5. DIFFICULTY BREATHING: "Are you having difficulty breathing?" If Yes, ask: "How bad is it?" (e.g., mild, moderate, severe)    - MILD: No SOB at rest, mild SOB with walking, speaks normally in sentences, can lie down, no retractions, pulse < 100.    - MODERATE: SOB at rest, SOB with minimal exertion and prefers to sit, cannot lie down flat, speaks in phrases, mild retractions, audible wheezing, pulse 100-120.    - SEVERE: Very SOB at rest, speaks in single words, struggling to breathe, sitting hunched forward, retractions, pulse > 120      Mild/moderate 6. FEVER: "Do you have a fever?" If Yes, ask: "What is your temperature, how was it measured, and when did it start?"     no 7. CARDIAC HISTORY: "Do you have any history of heart disease?" (e.g., heart attack, congestive heart failure)      no 8. LUNG HISTORY: "Do you have any history of lung disease?"  (e.g., pulmonary embolus, asthma, emphysema)     no 9. PE RISK FACTORS: "Do you have a history of blood clots?" (or: recent major surgery, recent prolonged travel, bedridden)     no 10. OTHER SYMPTOMS: "Do you have any other symptoms?" (e.g., runny nose, wheezing, chest pain)       Sore  throat, wheezy when supine, nasal congestion 11. PREGNANCY: "Is there any chance you are pregnant?" "When was your last menstrual period?"       na 12. TRAVEL: "Have you traveled out of the country in the last month?" (e.g., travel history, exposures)       no  Protocols used: Cough - Acute Productive-A-AH

## 2021-01-17 NOTE — Telephone Encounter (Signed)
°  Chief Complaint: sinus symptoms Symptoms: cough- green sputum, sore throat, nasal congestion- clear Frequency: 48 hours Pertinent Negatives: Patient denies fever Disposition: [] ED /[] Urgent Care (no appt availability in office) / [x] Appointment(In office/virtual)/ []  [facility]/ [] Home Care/ [] Refused Recommended Disposition  Additional Notes: Patient immune system compromised- cancer treatment

## 2021-01-17 NOTE — Progress Notes (Signed)
Acute Office Visit  Subjective:    Patient ID: Victoria Dean, female    DOB: January 14, 1951, 70 y.o.   MRN: [id number]  Chief Complaint  Patient presents with   Cough   Sore Throat   Introduced myself to the patient as a PA-C and provided education on APPs in clinical practice.    Cough This is a new problem. The current episode started in the past 7 days (2 days ago). The problem has been gradually worsening. The cough is Productive of sputum. Associated symptoms include nasal congestion, postnasal drip, rhinorrhea, a sore throat and shortness of breath. Pertinent negatives include no chest pain, chills, ear congestion, ear pain, fever, headaches, heartburn, hemoptysis, myalgias, rash, sweats, weight loss or wheezing. The symptoms are aggravated by lying down and exercise. Treatments tried: Muncinex. The treatment provided mild relief.   Patient states she began having a dry cough 2 days ago. Symptoms progress to sore throat, nasal congestion, and shortness of breath. Patient states she is very out-of-breath. Patient is having productive cough. She took mucinex yesterday.  No fevers, headaches, changes to taste or smell Cough is productive at night- patient states she has single incident of coughing up green phlegm at night and is able to return to sleep after that.  She has begun taking Mucinex yesterday but does not report change to symptoms.      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    Vitamin D deficiency     Past Surgical History:  Procedure Laterality Date   ABDOMINAL  HYSTERECTOMY  1995   APPENDECTOMY  1994   BREAST BIOPSY Right 1998   neg   BREAST BIOPSY Left 05/30/2020   stere bx x clip path pending   BREAST LUMPECTOMY WITH SENTINEL LYMPH NODE BIOPSY Left 06/29/2020   Procedure: BREAST LUMPECTOMY WITH SENTINEL LYMPH NODE BX;  Surgeon: Robert Bellow, MD;  Location: [facility] ORS;  Service: General;  Laterality: Left;   ESI     LUMBAR MICRODISCECTOMY  2019   L3-4   LUMBAR MICRODISCECTOMY  2008   L4-L5 repeated   MICRODISCECTOMY LUMBAR  2008   L4-5   MICRODISCECTOMY LUMBAR  2010   L4-L5 repeated   NODE DISSECTION Left 08/13/2020   Procedure: AXILLARY NODE DISSECTION;  Surgeon: Robert Bellow, MD;  Location: [facility] ORS;  Service: General;  Laterality: Left;   PORTACATH PLACEMENT Right 09/05/2020   Procedure: INSERTION PORT-A-CATH;  Surgeon: Robert Bellow, MD;  Location: [facility] ORS;  Service: General;  Laterality: Right;  right; monitor anesthesia care   SQUAMOUS CELL CARCINOMA EXCISION     TONSILLECTOMY  1971    Family History  Problem Relation Age of Onset   Breast cancer Mother 75   Osteosarcoma Mother        d. 16   Hypertension Brother    COPD Father    Hypertension Father    Breast cancer Paternal 52  dx 32s   Breast cancer Paternal Grandmother        dx 24s   Stomach cancer Paternal Grandfather        dx 48s   Breast cancer Paternal Aunt        dx 26s   Brain cancer Paternal Aunt        dx 23s    Social History   Socioeconomic History   Marital status: Widowed    Spouse name: Not on file   Number of children: 1   Years of education: Not on file   Highest education level: Bachelor's degree (e.g., BA, AB, BS)  Occupational History   Occupation: retired  Tobacco Use   Smoking status: Former    Packs/day: 0.91    Years: 33.00    Pack years: 30.03    Types: Cigarettes    Quit date: 04/19/2018    Years since quitting: 2.7   Smokeless tobacco: Never  Vaping Use   Vaping Use: Never used  Substance and Sexual  Activity   Alcohol use: Yes    Comment: rarely   Drug use: No   Sexual activity: Not Currently  Other Topics Concern   Not on file  Social History Narrative   Not on file   Social Determinants of Health   Financial Resource Strain: Not on file  Food Insecurity: Not on file  Transportation Needs: Not on file  Physical Activity: Not on file  Stress: Not on file  Social Connections: Not on file  Intimate Partner Violence: Not on file    Outpatient Medications Prior to Visit  Medication Sig Dispense Refill   acetaminophen (TYLENOL) 650 MG CR tablet Take 1,300 mg by mouth every 8 (eight) hours as needed for pain.     Biotin 1000 MCG tablet Take 1,000 mcg by mouth daily.     cholecalciferol (VITAMIN D) 25 MCG (1000 UNIT) tablet Take 1,000 Units by mouth daily.     cyanocobalamin (,VITAMIN B-12,) 1000 MCG/ML injection Inject 1 mL (1,000 mcg total) into the muscle every 30 (thirty) days. 12 mL 0   diazepam (VALIUM) 5 MG tablet Take 1 tablet (5 mg total) by mouth every 12 (twelve) hours as needed for anxiety. 60 tablet 5   hydrocortisone 2.5 % lotion Apply 1 application topically daily as needed (when washing face).     lidocaine-prilocaine (EMLA) cream Apply to affected area once 30 g 3   losartan (COZAAR) 100 MG tablet TAKE 1 TABLET (100 MG TOTAL) BY MOUTH DAILY. 90 tablet 1   Menthol, Topical Analgesic, (BIOFREEZE EX) Apply 1 application topically daily as needed (back pain).     mometasone (ELOCON) 0.1 % cream Apply 1 application topically daily as needed (ear irritation).     ondansetron (ZOFRAN) 8 MG tablet Take 1 tablet (8 mg total) by mouth 2 (two) times daily as needed (Nausea or vomiting). 30 tablet 1   Polyethyl Glycol-Propyl Glycol (SYSTANE OP) Place 1 drop into both eyes daily as needed (dry eyes).     potassium chloride SA (KLOR-CON) 20 MEQ tablet Take 1 tablet (20 mEq total) by mouth daily. 30 tablet 0   prochlorperazine (COMPAZINE) 10 MG tablet Take 1 tablet (10 mg total)  by mouth every 6 (six) hours as needed (Nausea or vomiting). 30 tablet 1   rosuvastatin (CRESTOR) 5 MG tablet TAKE 1 TABLET (5 MG TOTAL) BY MOUTH DAILY. 90 tablet 1   triamterene-hydrochlorothiazide (DYAZIDE) 37.5-25 MG capsule Take 1 each (1 capsule total) by  mouth daily. 90 capsule 4   venlafaxine XR (EFFEXOR-XR) 75 MG 24 hr capsule TAKE 3 CAPSULES (225 MG TOTAL) BY MOUTH DAILY WITH BREAKFAST. 270 capsule 1   No facility-administered medications prior to visit.    Allergies  Allergen Reactions   Other Itching    Brand name bandaids with cloth backing causes redness and itching   Statins     Muscle weakness   Zetia [Ezetimibe]     Leg pain, upset stomach   Sulfa Antibiotics Rash    Review of Systems  Constitutional:  Negative for appetite change, chills, fever and weight loss.  HENT:  Positive for postnasal drip, rhinorrhea and sore throat. Negative for ear pain.   Eyes:  Negative for visual disturbance.  Respiratory:  Positive for cough and shortness of breath. Negative for hemoptysis and wheezing.   Cardiovascular:  Negative for chest pain.  Gastrointestinal:  Negative for diarrhea, heartburn, nausea and vomiting.  Musculoskeletal:  Negative for arthralgias and myalgias.  Skin:  Negative for rash.  Neurological:  Negative for headaches.      Objective:    Physical Exam Constitutional:      General: She is awake.     Appearance: She is well-developed. She is obese.  HENT:     Head: Normocephalic.     Right Ear: Hearing and ear canal normal. Tympanic membrane is erythematous. Tympanic membrane is not retracted or bulging.     Left Ear: Hearing, tympanic membrane and ear canal normal.     Nose:     Right Turbinates: Enlarged.     Left Turbinates: Enlarged.     Right Sinus: Maxillary sinus tenderness present. No frontal sinus tenderness.     Left Sinus: No maxillary sinus tenderness or frontal sinus tenderness.     Mouth/Throat:     Mouth: Mucous membranes are moist.      Pharynx: Oropharynx is clear. Uvula midline. Posterior oropharyngeal erythema present.     Tonsils: No tonsillar exudate or tonsillar abscesses.     Comments: Unable to fully visualize posterior pharynx but Mild erythema noted along uvula  without signs of exudate along tonsillar pillars Cardiovascular:     Rate and Rhythm: Normal rate and regular rhythm.     Pulses: Normal pulses.     Heart sounds: Normal heart sounds.  Pulmonary:     Effort: Pulmonary effort is normal.     Breath sounds: Normal air entry. Decreased breath sounds present. No wheezing, rhonchi or rales.  Neurological:     Mental Status: She is alert.  Psychiatric:        Behavior: Behavior is cooperative.    BP 116/73 (BP Location: Right Arm, Patient Position: Sitting, Cuff Size: Large)    Pulse 96    Temp 98.5 F (36.9 C) (Temporal)    Resp 18    Ht 5\' 3"  (1.6 m)    Wt 235 lb (106.6 kg)    SpO2 92%    BMI 41.63 kg/m  Wt Readings from Last 3 Encounters:  01/17/21 235 lb (106.6 kg)  11/29/20 238 lb (108 kg)  11/26/20 238 lb 6.4 oz (108.1 kg)    Health Maintenance Due  Topic Date Due   COVID-19 Vaccine (1) Never done   Zoster Vaccines- Shingrix (1 of 2) Never done   Fecal DNA (Cologuard)  12/25/2019    There are no preventive care reminders to display for this patient.   Lab Results  Component Value Date   TSH 0.682 10/22/2020  Lab Results  Component Value Date   WBC 6.4 01/04/2021   HGB 11.3 (L) 01/04/2021   HCT 35.6 (L) 01/04/2021   MCV 98.6 01/04/2021   PLT 252 01/04/2021   Lab Results  Component Value Date   NA 137 11/26/2020   K 3.3 (L) 11/26/2020   CO2 30 11/26/2020   GLUCOSE 107 (H) 11/26/2020   BUN 24 (H) 11/26/2020   CREATININE 0.84 11/26/2020   BILITOT 0.6 11/26/2020   ALKPHOS 103 11/26/2020   AST 21 11/26/2020   ALT 26 11/26/2020   PROT 6.4 (L) 11/26/2020   ALBUMIN 3.5 11/26/2020   CALCIUM 8.4 (L) 11/26/2020   ANIONGAP 5 11/26/2020   Lab Results  Component Value Date   CHOL  151 02/23/2020   Lab Results  Component Value Date   HDL 53 02/23/2020   Lab Results  Component Value Date   LDLCALC 77 02/23/2020   Lab Results  Component Value Date   TRIG 119 02/23/2020   Lab Results  Component Value Date   CHOLHDL 2.8 02/23/2020   No results found for: HGBA1C     Assessment & Plan:   Problem List Items Addressed This Visit       Respiratory   Upper respiratory infection with cough and congestion - Primary    Visit with patient indicates symptoms comprised of cough, sinus congestion, sore throat,  since Tuesday congruent with acute URI that is likely viral in nature but possibility of flu, COVID, and bacterial URI cannot be completely ruled out at this time pending results of COVID and Flu testing.  Due to nature and duration of symptoms recommended treatment regimen is symptomatic relief and follow up if needed Discussed with patient the various viral and bacterial etiologies of current illness and appropriate course of treatment Discussed OTC medication options for multisymptom relief such as Dayquil/Nyquil, Theraflu, AlkaSeltzer, etc. Provided Tessalon pearls 100mg  PO BID PRN for cough management.  Reviewed precautions for tylenol use with OTC and current medications- patient reports she uses tylenol sparingly at this time. Discussed return precautions if symptoms are not improving or worsen over next 5-7 days.  If needed, patient was advised to call facility for SOB that has been exacerbated by URI, acute anxiety, and masks to assess if Albuterol inhaler may be beneficial for symptom relief but she declined this today.       Relevant Medications   benzonatate (TESSALON) 100 MG capsule   Other Relevant Orders   COVID-19, Flu A+B and RSV    Problem List Items Addressed This Visit       Respiratory   Upper respiratory infection with cough and congestion - Primary    Visit with patient indicates symptoms comprised of cough, sinus congestion, sore  throat,  since Tuesday congruent with acute URI that is likely viral in nature but possibility of flu, COVID, and bacterial URI cannot be completely ruled out at this time pending results of COVID and Flu testing.  Due to nature and duration of symptoms recommended treatment regimen is symptomatic relief and follow up if needed Discussed with patient the various viral and bacterial etiologies of current illness and appropriate course of treatment Discussed OTC medication options for multisymptom relief such as Dayquil/Nyquil, Theraflu, AlkaSeltzer, etc. Provided Tessalon pearls 100mg  PO BID PRN for cough management.  Reviewed precautions for tylenol use with OTC and current medications- patient reports she uses tylenol sparingly at this time. Discussed return precautions if symptoms are not improving or worsen over next  5-7 days.  If needed, patient was advised to call facility for SOB that has been exacerbated by URI, acute anxiety, and masks to assess if Albuterol inhaler may be beneficial for symptom relief but she declined this today.       Relevant Medications   benzonatate (TESSALON) 100 MG capsule   Other Relevant Orders   COVID-19, Flu A+B and RSV     No follow-ups on file.   I, Develle Sievers E Yamen Castrogiovanni, PA-C, have reviewed all documentation for this visit. The documentation on 01/17/21 for the exam, diagnosis, procedures, and orders are all accurate and complete.   Teagan Heidrick, Dani Gobble, PA-C MPH [facility]  Meds ordered this encounter  Medications   benzonatate (TESSALON) 100 MG capsule    Sig: Take 1 capsule (100 mg total) by mouth 2 (two) times daily as needed for up to 10 days for cough.    Dispense:  20 capsule    Refill:  0      Dequon Schnebly E Jacqulin Brandenburger, PA-C

## 2021-01-17 NOTE — Telephone Encounter (Signed)
°  Chief Complaint: Cough Symptoms: Sore throat, runny nose Frequency: Started Tuesday Pertinent Negatives: Patient denies fever Disposition: [] ED /[] Urgent Care (no appt availability in office) / [] Appointment(In office/virtual)/ []  [facility]/ [] Home Care/ [] Refused Recommended Disposition  Additional Notes: Request to come into office. Has done COVID test. Has cancer treatment at 0930.Please advise pt.    Answer Assessment - Initial Assessment Questions 1. ONSET: "When did the cough begin?"      Tuesday 2. SEVERITY: "How bad is the cough today?"      Severe 3. SPUTUM: "Describe the color of your sputum" (none, dry cough; clear, white, yellow, green)     Green 4. HEMOPTYSIS: "Are you coughing up any blood?" If so ask: "How much?" (flecks, streaks, tablespoons, etc.)     No 5. DIFFICULTY BREATHING: "Are you having difficulty breathing?" If Yes, ask: "How bad is it?" (e.g., mild, moderate, severe)    - MILD: No SOB at rest, mild SOB with walking, speaks normally in sentences, can lie down, no retractions, pulse < 100.    - MODERATE: SOB at rest, SOB with minimal exertion and prefers to sit, cannot lie down flat, speaks in phrases, mild retractions, audible wheezing, pulse 100-120.    - SEVERE: Very SOB at rest, speaks in single words, struggling to breathe, sitting hunched forward, retractions, pulse > 120      Mild 6. FEVER: "Do you have a fever?" If Yes, ask: "What is your temperature, how was it measured, and when did it start?"     No 7. CARDIAC HISTORY: "Do you have any history of heart disease?" (e.g., heart attack, congestive heart failure)      No 8. LUNG HISTORY: "Do you have any history of lung disease?"  (e.g., pulmonary embolus, asthma, emphysema)     No 9. PE RISK FACTORS: "Do you have a history of blood clots?" (or: recent major surgery, recent prolonged travel, bedridden)     No 10. OTHER SYMPTOMS: "Do you have any other symptoms?" (e.g., runny nose,  wheezing, chest pain)       Runny nose, sore throat 11. PREGNANCY: "Is there any chance you are pregnant?" "When was your last menstrual period?"       No 12. TRAVEL: "Have you traveled out of the country in the last month?" (e.g., travel history, exposures)       No  Protocols used: Cough - Acute Productive-A-AH

## 2021-01-17 NOTE — Telephone Encounter (Signed)
Patient was scheduled with Erin.

## 2021-01-17 NOTE — Assessment & Plan Note (Addendum)
Visit with patient indicates symptoms comprised of cough, sinus congestion, sore throat,  since Tuesday congruent with acute URI that is likely viral in nature but possibility of flu, COVID, and bacterial URI cannot be completely ruled out at this time pending results of COVID and Flu testing.  Due to nature and duration of symptoms recommended treatment regimen is symptomatic relief and follow up if needed Discussed with patient the various viral and bacterial etiologies of current illness and appropriate course of treatment Discussed OTC medication options for multisymptom relief such as Dayquil/Nyquil, Theraflu, AlkaSeltzer, etc. Provided Tessalon pearls 100mg  PO BID PRN for cough management.  Reviewed precautions for tylenol use with OTC and current medications- patient reports she uses tylenol sparingly at this time. Discussed return precautions if symptoms are not improving or worsen over next 5-7 days.  If needed, patient was advised to call facility for SOB that has been exacerbated by URI, acute anxiety, and masks to assess if Albuterol inhaler may be beneficial for symptom relief but she declined this today.

## 2021-01-17 NOTE — Patient Instructions (Addendum)
Based on your described symptoms and the duration of symptoms it is likely that you have a viral upper respiratory infection (often called a "cold")  Symptoms can last for 3-10 days with lingering cough and intermittent symptoms lasting weeks after that.  The goal of treatment at this time is to reduce your symptoms and discomfort   I have sent in Tessalon pearls for you to take twice per day to help with your cough  You can use over the counter medications such as Dayquil/Nyquil, AlkaSeltzer formulations, etc to provide further relief of symptoms according to the manufacturer's instructions    If your symptoms do not improve or become worse in the next 5-7 days please make an apt at the office so we can see you  Go to the ER if you begin to have more serious symptoms such as shortness of breath, trouble breathing, loss of consciousness, swelling around the eyes, high fever, severe lasting headaches, vision changes or neck pain/stiffness.   

## 2021-01-18 ENCOUNTER — Ambulatory Visit: Payer: Medicare PPO

## 2021-01-18 ENCOUNTER — Other Ambulatory Visit: Payer: Self-pay

## 2021-01-18 ENCOUNTER — Inpatient Hospital Stay: Payer: Medicare PPO

## 2021-01-18 ENCOUNTER — Ambulatory Visit
Admission: RE | Admit: 2021-01-18 | Discharge: 2021-01-18 | Disposition: A | Discharge status: Home / Self Care | Payer: Medicare PPO | Source: Ambulatory Visit | Attending: Radiation Oncology | Admitting: Radiation Oncology

## 2021-01-18 DIAGNOSIS — C50412 Malignant neoplasm of upper-outer quadrant of left female breast: Secondary | ICD-10-CM

## 2021-01-18 DIAGNOSIS — Z170 Estrogen receptor positive status [ER+]: Secondary | ICD-10-CM | POA: Diagnosis not present

## 2021-01-18 DIAGNOSIS — C773 Secondary and unspecified malignant neoplasm of axilla and upper limb lymph nodes: Secondary | ICD-10-CM | POA: Diagnosis not present

## 2021-01-18 DIAGNOSIS — Z510 Encounter for antineoplastic radiation therapy: Secondary | ICD-10-CM | POA: Diagnosis not present

## 2021-01-18 LAB — CBC
HCT: 34.1 % — ABNORMAL LOW (ref 36.0–46.0)
Hemoglobin: 11 g/dL — ABNORMAL LOW (ref 12.0–15.0)
MCH: 31.1 pg (ref 26.0–34.0)
MCHC: 32.3 g/dL (ref 30.0–36.0)
MCV: 96.3 fL (ref 80.0–100.0)
Platelets: 244 10*3/uL (ref 150–400)
RBC: 3.54 MIL/uL — ABNORMAL LOW (ref 3.87–5.11)
RDW: 14 % (ref 11.5–15.5)
WBC: 11.7 10*3/uL — ABNORMAL HIGH (ref 4.0–10.5)
nRBC: 0 % (ref 0.0–0.2)

## 2021-01-18 LAB — COVID-19, FLU A+B AND RSV
Influenza A, NAA: NOT DETECTED
Influenza B, NAA: NOT DETECTED
RSV, NAA: NOT DETECTED
SARS-CoV-2, NAA: NOT DETECTED

## 2021-01-19 ENCOUNTER — Inpatient Hospital Stay
Admission: EM | Admit: 2021-01-19 | Discharge: 2021-01-22 | DRG: 202 | Disposition: A | Discharge status: Home / Self Care | Payer: Medicare PPO | Attending: Internal Medicine | Admitting: Internal Medicine

## 2021-01-19 ENCOUNTER — Emergency Department: Payer: Medicare PPO

## 2021-01-19 ENCOUNTER — Other Ambulatory Visit: Payer: Self-pay

## 2021-01-19 ENCOUNTER — Encounter: Payer: Self-pay | Admitting: Emergency Medicine

## 2021-01-19 DIAGNOSIS — J40 Bronchitis, not specified as acute or chronic: Secondary | ICD-10-CM

## 2021-01-19 DIAGNOSIS — Z20822 Contact with and (suspected) exposure to covid-19: Secondary | ICD-10-CM | POA: Diagnosis present

## 2021-01-19 DIAGNOSIS — E7800 Pure hypercholesterolemia, unspecified: Secondary | ICD-10-CM | POA: Diagnosis present

## 2021-01-19 DIAGNOSIS — E559 Vitamin D deficiency, unspecified: Secondary | ICD-10-CM | POA: Diagnosis present

## 2021-01-19 DIAGNOSIS — Z90710 Acquired absence of both cervix and uterus: Secondary | ICD-10-CM

## 2021-01-19 DIAGNOSIS — Z800 Family history of malignant neoplasm of digestive organs: Secondary | ICD-10-CM

## 2021-01-19 DIAGNOSIS — Z66 Do not resuscitate: Secondary | ICD-10-CM | POA: Diagnosis present

## 2021-01-19 DIAGNOSIS — Z87891 Personal history of nicotine dependence: Secondary | ICD-10-CM

## 2021-01-19 DIAGNOSIS — Z170 Estrogen receptor positive status [ER+]: Secondary | ICD-10-CM | POA: Diagnosis not present

## 2021-01-19 DIAGNOSIS — J700 Acute pulmonary manifestations due to radiation: Secondary | ICD-10-CM | POA: Diagnosis present

## 2021-01-19 DIAGNOSIS — R0602 Shortness of breath: Secondary | ICD-10-CM | POA: Diagnosis not present

## 2021-01-19 DIAGNOSIS — F329 Major depressive disorder, single episode, unspecified: Secondary | ICD-10-CM | POA: Diagnosis present

## 2021-01-19 DIAGNOSIS — F32A Depression, unspecified: Secondary | ICD-10-CM | POA: Diagnosis present

## 2021-01-19 DIAGNOSIS — J9621 Acute and chronic respiratory failure with hypoxia: Secondary | ICD-10-CM | POA: Diagnosis present

## 2021-01-19 DIAGNOSIS — M1990 Unspecified osteoarthritis, unspecified site: Secondary | ICD-10-CM | POA: Diagnosis present

## 2021-01-19 DIAGNOSIS — Z8249 Family history of ischemic heart disease and other diseases of the circulatory system: Secondary | ICD-10-CM | POA: Diagnosis not present

## 2021-01-19 DIAGNOSIS — Z808 Family history of malignant neoplasm of other organs or systems: Secondary | ICD-10-CM

## 2021-01-19 DIAGNOSIS — J439 Emphysema, unspecified: Secondary | ICD-10-CM | POA: Diagnosis present

## 2021-01-19 DIAGNOSIS — Z9221 Personal history of antineoplastic chemotherapy: Secondary | ICD-10-CM

## 2021-01-19 DIAGNOSIS — F411 Generalized anxiety disorder: Secondary | ICD-10-CM | POA: Diagnosis present

## 2021-01-19 DIAGNOSIS — Z888 Allergy status to other drugs, medicaments and biological substances status: Secondary | ICD-10-CM

## 2021-01-19 DIAGNOSIS — Z825 Family history of asthma and other chronic lower respiratory diseases: Secondary | ICD-10-CM

## 2021-01-19 DIAGNOSIS — C50412 Malignant neoplasm of upper-outer quadrant of left female breast: Secondary | ICD-10-CM | POA: Diagnosis present

## 2021-01-19 DIAGNOSIS — Z882 Allergy status to sulfonamides status: Secondary | ICD-10-CM

## 2021-01-19 DIAGNOSIS — Z79899 Other long term (current) drug therapy: Secondary | ICD-10-CM

## 2021-01-19 DIAGNOSIS — Y842 Radiological procedure and radiotherapy as the cause of abnormal reaction of the patient, or of later complication, without mention of misadventure at the time of the procedure: Secondary | ICD-10-CM | POA: Diagnosis present

## 2021-01-19 DIAGNOSIS — Z803 Family history of malignant neoplasm of breast: Secondary | ICD-10-CM

## 2021-01-19 DIAGNOSIS — E876 Hypokalemia: Secondary | ICD-10-CM | POA: Diagnosis present

## 2021-01-19 DIAGNOSIS — I10 Essential (primary) hypertension: Secondary | ICD-10-CM | POA: Diagnosis present

## 2021-01-19 DIAGNOSIS — L309 Dermatitis, unspecified: Secondary | ICD-10-CM | POA: Diagnosis present

## 2021-01-19 DIAGNOSIS — J069 Acute upper respiratory infection, unspecified: Secondary | ICD-10-CM | POA: Diagnosis not present

## 2021-01-19 DIAGNOSIS — J209 Acute bronchitis, unspecified: Secondary | ICD-10-CM | POA: Diagnosis not present

## 2021-01-19 DIAGNOSIS — J9601 Acute respiratory failure with hypoxia: Secondary | ICD-10-CM | POA: Diagnosis present

## 2021-01-19 DIAGNOSIS — J309 Allergic rhinitis, unspecified: Secondary | ICD-10-CM | POA: Diagnosis not present

## 2021-01-19 LAB — CBC WITH DIFFERENTIAL/PLATELET
Abs Immature Granulocytes: 0.08 10*3/uL — ABNORMAL HIGH (ref 0.00–0.07)
Basophils Absolute: 0.1 10*3/uL (ref 0.0–0.1)
Basophils Relative: 0 %
Eosinophils Absolute: 0.2 10*3/uL (ref 0.0–0.5)
Eosinophils Relative: 1 %
HCT: 33.9 % — ABNORMAL LOW (ref 36.0–46.0)
Hemoglobin: 10.8 g/dL — ABNORMAL LOW (ref 12.0–15.0)
Immature Granulocytes: 1 %
Lymphocytes Relative: 11 %
Lymphs Abs: 1.4 10*3/uL (ref 0.7–4.0)
MCH: 30.9 pg (ref 26.0–34.0)
MCHC: 31.9 g/dL (ref 30.0–36.0)
MCV: 96.9 fL (ref 80.0–100.0)
Monocytes Absolute: 1.3 10*3/uL — ABNORMAL HIGH (ref 0.1–1.0)
Monocytes Relative: 10 %
Neutro Abs: 9.2 10*3/uL — ABNORMAL HIGH (ref 1.7–7.7)
Neutrophils Relative %: 77 %
Platelets: 233 10*3/uL (ref 150–400)
RBC: 3.5 MIL/uL — ABNORMAL LOW (ref 3.87–5.11)
RDW: 14.1 % (ref 11.5–15.5)
WBC: 12.1 10*3/uL — ABNORMAL HIGH (ref 4.0–10.5)
nRBC: 0 % (ref 0.0–0.2)

## 2021-01-19 LAB — BASIC METABOLIC PANEL
Anion gap: 8 (ref 5–15)
BUN: 15 mg/dL (ref 8–23)
CO2: 28 mmol/L (ref 22–32)
Calcium: 9.3 mg/dL (ref 8.9–10.3)
Chloride: 97 mmol/L — ABNORMAL LOW (ref 98–111)
Creatinine, Ser: 0.82 mg/dL (ref 0.44–1.00)
GFR, Estimated: >60 mL/min (ref >60)
Glucose, Bld: 119 mg/dL — ABNORMAL HIGH (ref 70–99)
Potassium: 3.4 mmol/L — ABNORMAL LOW (ref 3.5–5.1)
Sodium: 133 mmol/L — ABNORMAL LOW (ref 135–145)

## 2021-01-19 LAB — HEPATIC FUNCTION PANEL
ALT: 20 U/L (ref 0–44)
AST: 16 U/L (ref 15–41)
Albumin: 3.6 g/dL (ref 3.5–5.0)
Alkaline Phosphatase: 126 U/L (ref 38–126)
Bilirubin, Direct: 0.1 mg/dL (ref 0.0–0.2)
Indirect Bilirubin: 0.4 mg/dL (ref 0.3–0.9)
Total Bilirubin: 0.5 mg/dL (ref 0.3–1.2)
Total Protein: 7.3 g/dL (ref 6.5–8.1)

## 2021-01-19 LAB — RESP PANEL BY RT-PCR (FLU A&B, COVID) ARPGX2
Influenza A by PCR: NEGATIVE
Influenza B by PCR: NEGATIVE
SARS Coronavirus 2 by RT PCR: NEGATIVE

## 2021-01-19 LAB — TROPONIN I (HIGH SENSITIVITY)
Troponin I (High Sensitivity): 12 ng/L (ref <18)
Troponin I (High Sensitivity): 12 ng/L (ref <18)

## 2021-01-19 LAB — PHOSPHORUS: Phosphorus: 3.5 mg/dL (ref 2.5–4.6)

## 2021-01-19 LAB — TSH: TSH: 0.538 u[IU]/mL (ref 0.350–4.500)

## 2021-01-19 LAB — MAGNESIUM: Magnesium: 2.3 mg/dL (ref 1.7–2.4)

## 2021-01-19 MED ORDER — ONDANSETRON HCL 4 MG/2ML IJ SOLN: 4.0000 mg | Freq: Four times a day (QID) | INTRAMUSCULAR | Status: DC | PRN

## 2021-01-19 MED ORDER — ACETAMINOPHEN 650 MG RE SUPP: 650.0000 mg | Freq: Four times a day (QID) | RECTAL | Status: DC | PRN

## 2021-01-19 MED ORDER — LOSARTAN POTASSIUM 50 MG PO TABS
100.0000 mg | ORAL_TABLET | Freq: Every day | ORAL | Status: DC
  Administered 2021-01-20 – 2021-01-22 (×3): 100 mg via ORAL
  Filled 2021-01-19 (×3): qty 2

## 2021-01-19 MED ORDER — ACETAMINOPHEN 500 MG PO TABS: 1000.0000 mg | ORAL_TABLET | Freq: Four times a day (QID) | ORAL | Status: DC | PRN

## 2021-01-19 MED ORDER — BENZONATATE 100 MG PO CAPS
100.0000 mg | ORAL_CAPSULE | Freq: Two times a day (BID) | ORAL | Status: DC | PRN
  Administered 2021-01-19 – 2021-01-22 (×4): 100 mg via ORAL
  Filled 2021-01-19 (×4): qty 1

## 2021-01-19 MED ORDER — IPRATROPIUM-ALBUTEROL 0.5-2.5 (3) MG/3ML IN SOLN
RESPIRATORY_TRACT | Status: AC
  Administered 2021-01-19: 3 mL via RESPIRATORY_TRACT
  Filled 2021-01-19: qty 3

## 2021-01-19 MED ORDER — HYDROCOD POLST-CPM POLST ER 10-8 MG/5ML PO SUER
5.0000 mL | Freq: Every day | ORAL | Status: DC
  Administered 2021-01-19 – 2021-01-21 (×3): 5 mL via ORAL
  Filled 2021-01-19 (×3): qty 5

## 2021-01-19 MED ORDER — METHYLPREDNISOLONE SODIUM SUCC 125 MG IJ SOLR
125.0000 mg | Freq: Once | INTRAMUSCULAR | Status: AC
  Administered 2021-01-19: 125 mg via INTRAVENOUS
  Filled 2021-01-19: qty 2

## 2021-01-19 MED ORDER — VITAMIN D 25 MCG (1000 UNIT) PO TABS: 1000.0000 [IU] | ORAL_TABLET | Freq: Every day | ORAL | Status: DC

## 2021-01-19 MED ORDER — IPRATROPIUM-ALBUTEROL 0.5-2.5 (3) MG/3ML IN SOLN
3.0000 mL | Freq: Once | RESPIRATORY_TRACT | Status: AC
  Administered 2021-01-19: 3 mL via RESPIRATORY_TRACT
  Filled 2021-01-19: qty 3

## 2021-01-19 MED ORDER — ROSUVASTATIN CALCIUM 5 MG PO TABS: 5.0000 mg | ORAL_TABLET | Freq: Every day | ORAL | Status: DC

## 2021-01-19 MED ORDER — IPRATROPIUM-ALBUTEROL 0.5-2.5 (3) MG/3ML IN SOLN: 3.0000 mL | Freq: Once | RESPIRATORY_TRACT | Status: AC

## 2021-01-19 MED ORDER — IOHEXOL 350 MG/ML SOLN
75.0000 mL | Freq: Once | INTRAVENOUS | Status: AC | PRN
  Administered 2021-01-19: 75 mL via INTRAVENOUS
  Filled 2021-01-19: qty 75

## 2021-01-19 MED ORDER — ALBUTEROL SULFATE (2.5 MG/3ML) 0.083% IN NEBU: 2.5000 mg | INHALATION_SOLUTION | Freq: Four times a day (QID) | RESPIRATORY_TRACT | Status: AC | PRN

## 2021-01-19 MED ORDER — LIDOCAINE-PRILOCAINE 2.5-2.5 % EX CREA
TOPICAL_CREAM | Freq: Three times a day (TID) | CUTANEOUS | Status: DC | PRN
  Filled 2021-01-19: qty 5

## 2021-01-19 MED ORDER — METHYLPREDNISOLONE SODIUM SUCC 125 MG IJ SOLR
80.0000 mg | Freq: Every day | INTRAMUSCULAR | Status: AC
  Administered 2021-01-20 – 2021-01-21 (×2): 80 mg via INTRAVENOUS
  Filled 2021-01-19 (×2): qty 2

## 2021-01-19 MED ORDER — IPRATROPIUM-ALBUTEROL 0.5-2.5 (3) MG/3ML IN SOLN
3.0000 mL | Freq: Three times a day (TID) | RESPIRATORY_TRACT | Status: AC
  Administered 2021-01-20 (×2): 3 mL via RESPIRATORY_TRACT
  Filled 2021-01-19 (×2): qty 3

## 2021-01-19 MED ORDER — ONDANSETRON HCL 4 MG PO TABS: 4.0000 mg | ORAL_TABLET | Freq: Four times a day (QID) | ORAL | Status: DC | PRN

## 2021-01-19 MED ORDER — DIAZEPAM 5 MG PO TABS: 5.0000 mg | ORAL_TABLET | Freq: Two times a day (BID) | ORAL | Status: DC | PRN

## 2021-01-19 MED ORDER — ENOXAPARIN SODIUM 60 MG/0.6ML IJ SOSY
0.5000 mg/kg | PREFILLED_SYRINGE | INTRAMUSCULAR | Status: DC
  Administered 2021-01-19 – 2021-01-21 (×3): 52.5 mg via SUBCUTANEOUS
  Filled 2021-01-19 (×3): qty 0.6

## 2021-01-19 MED ORDER — HYDROCOD POLST-CPM POLST ER 10-8 MG/5ML PO SUER: 5.0000 mL | Freq: Every evening | ORAL | Status: DC | PRN

## 2021-01-19 MED ORDER — ENOXAPARIN SODIUM 40 MG/0.4ML IJ SOSY: 40.0000 mg | PREFILLED_SYRINGE | INTRAMUSCULAR | Status: DC

## 2021-01-19 MED ORDER — VENLAFAXINE HCL ER 75 MG PO CP24
225.0000 mg | ORAL_CAPSULE | Freq: Every day | ORAL | Status: DC
  Administered 2021-01-20 – 2021-01-22 (×3): 225 mg via ORAL
  Filled 2021-01-19 (×3): qty 3

## 2021-01-19 MED ORDER — LORAZEPAM 2 MG/ML IJ SOLN: 1.0000 mg | Freq: Four times a day (QID) | INTRAMUSCULAR | Status: DC | PRN

## 2021-01-19 NOTE — H&P (Signed)
History and Physical   Victoria Dean HYQ:[id number] DOB: 11/20/1950 DOA: 01/19/2021  PCP: Jerrol Banana., MD  Outpatient Specialists: Dr. Janese Banks, medical oncology Patient coming from: Home  I have personally briefly reviewed patient's old medical records in [place].  Chief Concern: Shortness of breath  HPI: Victoria Dean is a 70 y.o. female with medical history significant for History of left upper outer quadrant breast cancer, ER positive (chemotherapy completed) now and radiation 5 days per week, via right anterior chest wall IJ Port-A-Cath, anxiety, hypertension, osteoarthritis, depression, who presents emergency department for chief concerns of shortness of breath.  She states that the shortness of breath was noticed on Thursday.and it has progressively worsened.  The shortness of breath got so bad yesterday, she need a wheelchair after completing radiation therapy.  She states the shortness of breath is worse with exertion. She denies fever. She endorses chest pain with coughing.   She denies abdominal pain, dysuria, diarrhea, swelling in her legs, syncope, lost of consicousness.   Social history: She is a former tobacco user, quitting 3 years ago, at her peak, she smoked 1 ppd. She infrequently drinks etoh, last drink was last Christmas 2021. She denies recreational drug use. She formerly worked as a Conservation officer, historic buildings, as a Research scientist (physical sciences).   Vaccination history: She is vaccinated for covid 19, two doses.   ROS: Constitutional: no weight change, no fever ENT/Mouth: no sore throat, no rhinorrhea Eyes: no eye pain, no vision changes Cardiovascular: no chest pain, + dyspnea,  no edema, no palpitations Respiratory: + cough, no sputum, + wheezing Gastrointestinal: no nausea, no vomiting, no diarrhea, no constipation Genitourinary: no urinary incontinence, no dysuria, no hematuria Musculoskeletal: no arthralgias, no myalgias Skin: no skin lesions, no pruritus, Neuro: + weakness,  no loss of consciousness, no syncope Psych: no anxiety, no depression, + decrease appetite Heme/Lymph: no bruising, no bleeding  ED Course: Discussed with emergency medicine provider, patient requiring hospitalization for chief concerns of acute hypoxic respiratory failure on room air.  Vitals in the emergency department was remarkable for temperature of 98.4, respiration rate of 20, initial heart rate of 95, blood pressure of 120/65, SPO2 of 94% on room air, 86% with ambulation, and 96% on 2 L nasal cannula.  Labs in the emergency department showed serum sodium of 133, potassium 3.4, chloride 97, bicarb 28, BUN of 15, serum creatinine of 0.82, nonfasting blood glucose 119, GFR greater than 60, WBC was elevated at 12.1, hemoglobin 10.8, platelets 233.  LFT was within normal limits.  COVID/influenza A/influenza B PCR were negative.  In the emergency department patient was given duo nebs x3, Solu-Medrol 125 mg IV.  Assessment/Plan  Principal Problem:   Acute on chronic respiratory failure with hypoxia (HCC) Active Problems:   Clinical depression   Dermatitis, eczematoid   Essential (primary) hypertension   Anxiety, generalized   Arthritis, degenerative   Malignant neoplasm of upper-outer quadrant of left breast in female, estrogen receptor positive ([place])   Upper respiratory infection with cough and congestion   # Acute hypoxic respiratory failure in setting of no prior oxygen supplementation - Presumed secondary to bronchitis - COVID/influenza A/influenza B PCR were negative - DuoNebs 3 times daily, 4 more doses ordered - Solu-Medrol 80 mg IV daily ordered for 2 more doses - Incentive spirometry, flutter valve - Continue supportive measures: Tussionex nightly as needed for cough, Tessalon Perles, albuterol nebulizer every 6 hours as needed for refractory wheezing and shortness of breath - Admit to  telemetry medical floor, observation  # History of generalized anxiety-resumed home  Valium 5 mg p.o. every 12 hours as needed for anxiety - Ativan injection 1 mg IV every 6 hours.  For refractory anxiety, 3 doses ordered  # Depression-resumed venlafaxine to 25 mg p.o. daily with breakfast  # Hypertension-resumed home losartan 100 mg daily, holding home hydrochlorothiazide and triamterene at this time due to mild hypokalemia and low normotensive blood pressure  Chart reviewed.   DVT prophylaxis: Enoxaparin subcutaneous Code Status: DNR, and daugther states this is new to her, however, she heard and respected her mother's decision Diet: Heart healthy Family Communication: Updated, daughter at bedside.  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    Vitamin D deficiency    Past Surgical History:  Procedure Laterality Date   [place]   BREAST BIOPSY Right 1998   neg   BREAST BIOPSY Left 05/30/2020   stere bx x clip path pending   BREAST LUMPECTOMY WITH SENTINEL LYMPH NODE BIOPSY Left 06/29/2020   Procedure: BREAST LUMPECTOMY WITH SENTINEL LYMPH NODE BX;  Surgeon: Robert Bellow, MD;  Location: [facility] ORS;  Service: General;  Laterality: Left;   ESI     LUMBAR MICRODISCECTOMY  2019   L3-4   LUMBAR MICRODISCECTOMY  2008   L4-L5 repeated   MICRODISCECTOMY LUMBAR  2008   L4-5   MICRODISCECTOMY LUMBAR  2010   L4-L5 repeated   NODE DISSECTION Left 08/13/2020   Procedure:  AXILLARY NODE DISSECTION;  Surgeon: Robert Bellow, MD;  Location: [facility] ORS;  Service: General;  Laterality: Left;   PORTACATH PLACEMENT Right 09/05/2020   Procedure: INSERTION PORT-A-CATH;  Surgeon: Robert Bellow, MD;  Location: [facility] ORS;  Service: General;  Laterality: Right;  right; monitor anesthesia care   [place]   Social History:  reports that she quit smoking about 2 years ago. Her smoking use included cigarettes. She has a 30.03 pack-year smoking history. She has never used smokeless tobacco. She reports current alcohol use. She reports that she does not use drugs.  Allergies  Allergen Reactions   Other Itching    Brand name bandaids with cloth backing causes redness and itching   Statins     Muscle weakness   Zetia [Ezetimibe]     Leg pain, upset stomach   Sulfa Antibiotics Rash   Family History  Problem Relation Age of Onset   Breast cancer Mother 44   Osteosarcoma Mother        d. 55   Hypertension Brother    COPD Father    Hypertension Father    Breast cancer Paternal Aunt        dx 49s   Breast cancer Paternal Grandmother  dx 26s   Stomach cancer Paternal Grandfather        dx 64s   Breast cancer Paternal Aunt        dx 44s   Brain cancer Paternal Aunt        dx 31s   Family history: Family history reviewed and not pertinent  Prior to Admission medications   Medication Sig Start Date End Date Taking? Authorizing Provider  acetaminophen (TYLENOL) 650 MG CR tablet Take 1,300 mg by mouth every 8 (eight) hours as needed for pain.    [provider]  benzonatate (TESSALON) 100 MG capsule Take 1 capsule (100 mg total) by mouth 2 (two) times daily as needed for up to 10 days for cough. 01/17/21 01/27/21  Mecum, Dani Gobble, PA-C  Biotin 1000 MCG tablet Take 1,000 mcg by mouth daily.    [provider]  cholecalciferol (VITAMIN D) 25 MCG (1000 UNIT) tablet Take 1,000 Units by mouth daily.     [provider]  cyanocobalamin (,VITAMIN B-12,) 1000 MCG/ML injection Inject 1 mL (1,000 mcg total) into the muscle every 30 (thirty) days. 11/26/20   Verlon Au, NP  diazepam (VALIUM) 5 MG tablet Take 1 tablet (5 mg total) by mouth every 12 (twelve) hours as needed for anxiety. 07/23/20   Jerrol Banana., MD  hydrocortisone 2.5 % lotion Apply 1 application topically daily as needed (when washing face).    [provider]  lidocaine-prilocaine (EMLA) cream Apply to affected area once 09/07/20   Sindy Guadeloupe, MD  losartan (COZAAR) 100 MG tablet TAKE 1 TABLET (100 MG TOTAL) BY MOUTH DAILY. 09/13/20   Jerrol Banana., MD  Menthol, Topical Analgesic, (BIOFREEZE EX) Apply 1 application topically daily as needed (back pain).    [provider]  mometasone (ELOCON) 0.1 % cream Apply 1 application topically daily as needed (ear irritation).    [provider]  ondansetron (ZOFRAN) 8 MG tablet Take 1 tablet (8 mg total) by mouth 2 (two) times daily as needed (Nausea or vomiting). 09/07/20   Sindy Guadeloupe, MD  Polyethyl Glycol-Propyl Glycol (SYSTANE OP) Place 1 drop into both eyes daily as needed (dry eyes).    [provider]  potassium chloride SA (KLOR-CON) 20 MEQ tablet Take 1 tablet (20 mEq total) by mouth daily. 11/26/20   Verlon Au, NP  prochlorperazine (COMPAZINE) 10 MG tablet Take 1 tablet (10 mg total) by mouth every 6 (six) hours as needed (Nausea or vomiting). 09/07/20   Sindy Guadeloupe, MD  rosuvastatin (CRESTOR) 5 MG tablet TAKE 1 TABLET (5 MG TOTAL) BY MOUTH DAILY. 09/13/20   Jerrol Banana., MD  triamterene-hydrochlorothiazide (DYAZIDE) 37.5-25 MG capsule Take 1 each (1 capsule total) by mouth daily. 01/11/20   Jerrol Banana., MD  venlafaxine XR (EFFEXOR-XR) 75 MG 24 hr capsule TAKE 3 CAPSULES (225 MG TOTAL) BY MOUTH DAILY WITH BREAKFAST. 01/10/21   Jerrol Banana., MD   Physical Exam: Vitals:   01/19/21 1252  01/19/21 1547 01/19/21 1715 01/19/21 1845  BP: 120/65 (!) 119/50    Pulse: 97 96 92 97  Resp: 20 20 20    Temp: 98.2 F (36.8 C) 98.4 F (36.9 C)    TempSrc: Oral Oral    SpO2: 94% 96% 95%   Weight: 106.6 kg     Height: 5\' 3"  (1.6 m)      Constitutional: appears age-appropriate, frail, NAD, calm, comfortable Eyes: PERRL, lids and  conjunctivae normal ENMT: Mucous membranes are moist. Posterior pharynx clear of any exudate or lesions. Age-appropriate dentition. Hearing appropriate Neck: normal, supple, no masses, no thyromegaly Respiratory: clear to auscultation bilaterally, + wheezing, no crackles. Normal respiratory effort. No accessory muscle use.  Cardiovascular: Regular rate and rhythm, no murmurs / rubs / gallops. No extremity edema. 2+ pedal pulses. No carotid bruits.  Abdomen: Obese abdomen, no tenderness, no masses palpated, no hepatosplenomegaly. Bowel sounds positive.  Musculoskeletal: no clubbing / cyanosis. No joint deformity upper and lower extremities. Good ROM, no contractures, no atrophy. Normal muscle tone.  Skin: no rashes, lesions, ulcers. No induration Neurologic: Sensation intact. Strength 5/5 in all 4.  Psychiatric: Normal judgment and insight. Alert and oriented x 3. Normal mood.   EKG: independently reviewed, showing sinus rhythm with rate of 95, QTc 462  Chest x-ray on Admission: I personally reviewed and I agree with radiologist reading as below.  DG Chest 2 View  Result Date: 01/19/2021 CLINICAL DATA:  Short of breath. EXAM: CHEST - 2 VIEW COMPARISON:  09/05/2020. FINDINGS: Cardiac silhouette is normal in size. No mediastinal or hilar masses or evidence of adenopathy. Right anterior chest wall internal jugular Port-A-Cath is stable. Clear lungs.  No pleural effusion or pneumothorax. Skeletal structures are intact. IMPRESSION: No active cardiopulmonary disease. Electronically Signed   By: Lajean Manes M.D.   On: 01/19/2021 13:43   CT Angio Chest PE W and/or  Wo Contrast  Addendum Date: 01/19/2021   ADDENDUM REPORT: 01/19/2021 19:19 ADDENDUM: Postsurgical changes are noted in the left breast similar in appearance to that seen on the prior exam consistent with the given clinical history. Electronically Signed   By: Inez Catalina M.D.   On: 01/19/2021 19:19   Result Date: 01/19/2021 CLINICAL DATA:  Shortness of breath for 1 week, initial encounter EXAM: CT ANGIOGRAPHY CHEST WITH CONTRAST TECHNIQUE: Multidetector CT imaging of the chest was performed using the standard protocol during bolus administration of intravenous contrast. Multiplanar CT image reconstructions and MIPs were obtained to evaluate the vascular anatomy. CONTRAST:  51mL OMNIPAQUE IOHEXOL 350 MG/ML SOLN COMPARISON:  Chest x-ray from earlier in the same day. FINDINGS: Cardiovascular: Atherosclerotic calcifications of the thoracic aorta are noted. No aneurysmal dilatation or dissection is seen. No cardiac enlargement is noted. The pulmonary artery shows a normal branching pattern bilaterally. No filling defects are identified to suggest pulmonary embolism. Scattered coronary calcifications are noted. Mediastinum/Nodes: Thoracic inlet is within normal limits. Right-sided chest wall port is noted in satisfactory position. Scattered small hilar lymph nodes are seen new from the prior exam but not significant by size criteria. No mediastinal adenopathy is noted. The esophagus as visualized is within normal limits. Lungs/Pleura: Lungs are well aerated bilaterally. No focal infiltrate or sizable effusion is seen. Mild bronchial wall thickening is noted throughout both lungs likely representing a degree of bronchitis. No sizable parenchymal nodules are noted. Upper Abdomen: Fatty infiltration of the liver is noted. The remainder of the upper abdomen is within normal limits. Musculoskeletal: Degenerative changes of the thoracic spine are noted. No acute rib abnormality is seen. Review of the MIP images confirms  the above findings. IMPRESSION: No evidence of pulmonary emboli. Mild bronchial wall thickening and scattered small likely reactive hilar nodes bilaterally. These changes are likely related to underlying bronchitis. Fatty liver. Aortic Atherosclerosis (ICD10-I70.0). Electronically Signed: By: Inez Catalina M.D. On: 01/19/2021 18:07    Labs on Admission: I have personally reviewed following labs  CBC: Recent Labs  Lab 01/18/21  0814 01/19/21 1314  WBC 11.7* 12.1*  NEUTROABS  --  9.2*  HGB 11.0* 10.8*  HCT 34.1* 33.9*  MCV 96.3 96.9  PLT 244 481   Basic Metabolic Panel: Recent Labs  Lab 01/19/21 1314  NA 133*  K 3.4*  CL 97*  CO2 28  GLUCOSE 119*  BUN 15  CREATININE 0.82  CALCIUM 9.3   GFR: Estimated Creatinine Clearance: 72.5 mL/min (by C-G formula based on SCr of 0.82 mg/dL).  Liver Function Tests: Recent Labs  Lab 01/19/21 1710  AST 16  ALT 20  ALKPHOS 126  BILITOT 0.5  PROT 7.3  ALBUMIN 3.6   Urine analysis:    Component Value Date/Time   COLORURINE YELLOW 01/21/2019 1629   APPEARANCEUR HAZY (A) 01/21/2019 1629   LABSPEC 1.015 01/21/2019 1629   PHURINE 7.0 01/21/2019 1629   GLUCOSEU NEGATIVE 01/21/2019 1629   HGBUR NEGATIVE 01/21/2019 1629   [place] 01/21/2019 1629   BILIRUBINUR negative 10/27/2018 [place] 01/21/2019 1629   PROTEINUR NEGATIVE 01/21/2019 1629   UROBILINOGEN 0.2 10/27/2018 1012   NITRITE NEGATIVE 01/21/2019 1629   LEUKOCYTESUR NEGATIVE 01/21/2019 1629   Dr. Tobie Poet [facility]  If 7PM-7AM, please contact overnight-coverage provider If 7AM-7PM, please contact day coverage provider [email or web address]  01/19/2021, 7:28 PM

## 2021-01-19 NOTE — ED Provider Notes (Signed)
[facility] Emergency Department Provider Note  ____________________________________________   Event Date/Time   First MD Initiated Contact with Patient 01/19/21 1656     (approximate)  I have reviewed the triage vital signs and the nursing notes.   HISTORY  Chief Complaint Shortness of Breath (/)    HPI Victoria Dean is a 70 y.o. female with breast cancer who underwent chemotherapy with tamoxifen and is now on radiation who comes in with shortness of breath.  Patient reports that her oxygen saturations were low at 86%.  She states that she initially just had a little bit of cough and was checked in clinic for flu and COVID and these were negative.  She states that she is had some worsening shortness of breath more with exertion, better at rest.  She does report coughing up some sputum.  Denies any known sick contacts.  No unilateral leg swelling.  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    Vitamin D deficiency     Patient Active Problem List   Diagnosis Date Noted   Upper respiratory infection with cough and congestion 01/17/2021   B12 deficiency 10/09/2020   Genetic testing 06/26/2020   Family history of breast cancer    Family history of stomach cancer    Malignant neoplasm of upper-outer quadrant of left breast in female, estrogen receptor positive ([place]) 06/10/2020   Goals of  care, counseling/discussion 06/10/2020   Impingement syndrome of right shoulder region 05/06/2019   Low back pain 01/24/2019   Personal history of tobacco use, presenting hazards to health 02/23/2018   History of lumbar laminectomy 04/30/2016   Actinic keratosis 11/21/2014   Adaptation reaction 11/21/2014   Allergic rhinitis 11/21/2014   Cornu cutaneum 11/21/2014   Clinical depression 11/21/2014   Dermatitis, eczematoid 11/21/2014   Enthesopathy 11/21/2014   Essential (primary) hypertension 11/21/2014   Anxiety, generalized 11/21/2014   Herniated nucleus pulposus 11/21/2014   Mild episode of recurrent major depressive disorder ([place]) 11/21/2014   Adiposity 11/21/2014   Arthritis, degenerative 11/21/2014   Basal cell papilloma 11/21/2014   Apnea, sleep 11/21/2014   Avitaminosis D 11/21/2014    Past Surgical History:  Procedure Laterality Date   ABDOMINAL HYSTERECTOMY  1995   APPENDECTOMY  1994   BREAST BIOPSY Right 1998   neg   BREAST BIOPSY Left 05/30/2020   stere bx x clip path pending   BREAST LUMPECTOMY WITH SENTINEL LYMPH NODE BIOPSY Left 06/29/2020   Procedure: BREAST LUMPECTOMY WITH SENTINEL LYMPH NODE BX;  Surgeon: Robert Bellow, MD;  Location: [facility] ORS;  Service: General;  Laterality: Left;   ESI     LUMBAR MICRODISCECTOMY  2019   L3-4   LUMBAR MICRODISCECTOMY  2008   L4-L5 repeated  MICRODISCECTOMY LUMBAR  2008   L4-5   MICRODISCECTOMY LUMBAR  2010   L4-L5 repeated   NODE DISSECTION Left 08/13/2020   Procedure: AXILLARY NODE DISSECTION;  Surgeon: Robert Bellow, MD;  Location: [facility] ORS;  Service: General;  Laterality: Left;   PORTACATH PLACEMENT Right 09/05/2020   Procedure: INSERTION PORT-A-CATH;  Surgeon: Robert Bellow, MD;  Location: [facility] ORS;  Service: General;  Laterality: Right;  right; monitor anesthesia care   [place]    Prior to Admission medications   Medication Sig Start Date End Date  Taking? Authorizing Provider  acetaminophen (TYLENOL) 650 MG CR tablet Take 1,300 mg by mouth every 8 (eight) hours as needed for pain.    [provider]  benzonatate (TESSALON) 100 MG capsule Take 1 capsule (100 mg total) by mouth 2 (two) times daily as needed for up to 10 days for cough. 01/17/21 01/27/21  Mecum, Dani Gobble, PA-C  Biotin 1000 MCG tablet Take 1,000 mcg by mouth daily.    [provider]  cholecalciferol (VITAMIN D) 25 MCG (1000 UNIT) tablet Take 1,000 Units by mouth daily.    [provider]  cyanocobalamin (,VITAMIN B-12,) 1000 MCG/ML injection Inject 1 mL (1,000 mcg total) into the muscle every 30 (thirty) days. 11/26/20   Verlon Au, NP  diazepam (VALIUM) 5 MG tablet Take 1 tablet (5 mg total) by mouth every 12 (twelve) hours as needed for anxiety. 07/23/20   Jerrol Banana., MD  hydrocortisone 2.5 % lotion Apply 1 application topically daily as needed (when washing face).    [provider]  lidocaine-prilocaine (EMLA) cream Apply to affected area once 09/07/20   Sindy Guadeloupe, MD  losartan (COZAAR) 100 MG tablet TAKE 1 TABLET (100 MG TOTAL) BY MOUTH DAILY. 09/13/20   Jerrol Banana., MD  Menthol, Topical Analgesic, (BIOFREEZE EX) Apply 1 application topically daily as needed (back pain).    [provider]  mometasone (ELOCON) 0.1 % cream Apply 1 application topically daily as needed (ear irritation).    [provider]  ondansetron (ZOFRAN) 8 MG tablet Take 1 tablet (8 mg total) by mouth 2 (two) times daily as needed (Nausea or vomiting). 09/07/20   Sindy Guadeloupe, MD  Polyethyl Glycol-Propyl Glycol (SYSTANE OP) Place 1 drop into both eyes daily as needed (dry eyes).    [provider]  potassium chloride SA (KLOR-CON) 20 MEQ tablet Take 1 tablet (20 mEq total) by mouth daily. 11/26/20   Verlon Au, NP  prochlorperazine (COMPAZINE) 10 MG tablet Take 1 tablet (10 mg total) by mouth every 6 (six) hours  as needed (Nausea or vomiting). 09/07/20   Sindy Guadeloupe, MD  rosuvastatin (CRESTOR) 5 MG tablet TAKE 1 TABLET (5 MG TOTAL) BY MOUTH DAILY. 09/13/20   Jerrol Banana., MD  triamterene-hydrochlorothiazide (DYAZIDE) 37.5-25 MG capsule Take 1 each (1 capsule total) by mouth daily. 01/11/20   Jerrol Banana., MD  venlafaxine XR (EFFEXOR-XR) 75 MG 24 hr capsule TAKE 3 CAPSULES (225 MG TOTAL) BY MOUTH DAILY WITH BREAKFAST. 01/10/21   Jerrol Banana., MD    Allergies Other, Statins, Zetia [ezetimibe], and Sulfa antibiotics  Family History  Problem Relation Age of Onset   Breast cancer Mother 52   Osteosarcoma Mother        d. 39   Hypertension Brother    COPD Father    Hypertension  Father    Breast cancer Paternal Aunt        dx 77s   Breast cancer Paternal Grandmother        dx 46s   Stomach cancer Paternal Grandfather        dx 80s   Breast cancer Paternal Aunt        dx 42s   Brain cancer Paternal Aunt        dx 68s    Social History Social History   Tobacco Use   Smoking status: Former    Packs/day: 0.91    Years: 33.00    Pack years: 30.03    Types: Cigarettes    Quit date: 04/19/2018    Years since quitting: 2.7   Smokeless tobacco: Never  Vaping Use   Vaping Use: Never used  Substance Use Topics   Alcohol use: Yes    Comment: rarely   Drug use: No      Review of Systems Constitutional: No fever/chills Eyes: No visual changes. ENT: No sore throat. Cardiovascular: No chest pain Respiratory: Positive for SOB, cough, congestion Gastrointestinal: No abdominal pain.  No nausea, no vomiting.  No diarrhea.  No constipation. Genitourinary: Negative for dysuria. Musculoskeletal: Negative for back pain. Skin: Negative for rash. Neurological: Negative for headaches, focal weakness or numbness. All other ROS negative ____________________________________________   PHYSICAL EXAM:  VITAL SIGNS: ED Triage Vitals [01/19/21 1252]  Enc Vitals Group      BP 120/65     Pulse Rate 97     Resp 20     Temp 98.2 F (36.8 C)     Temp Source Oral     SpO2 94 %     Weight 235 lb (106.6 kg)     Height 5\' 3"  (1.6 m)     Head Circumference      Peak Flow      Pain Score 7     Pain Loc      Pain Edu?      Excl. in [place]?     Constitutional: Alert and oriented. Well appearing and in no acute distress. Eyes: Conjunctivae are normal. EOMI. Head: Atraumatic. Nose: No congestion/rhinnorhea. Mouth/Throat: Mucous membranes are moist.  Breathing through pursed lips Neck: No stridor. Trachea Midline. FROM Cardiovascular: Normal rate, regular rhythm. Grossly normal heart sounds.  Good peripheral circulation. Respiratory: Clear lungs with some mild work of breathing with talking Gastrointestinal: Soft and nontender. No distention. No abdominal bruits.  Musculoskeletal: No lower extremity tenderness nor edema.  No joint effusions. Neurologic:  Normal speech and language. No gross focal neurologic deficits are appreciated.  Skin:  Skin is warm, dry and intact. No rash noted. Psychiatric: Mood and affect are normal. Speech and behavior are normal. GU: Deferred   ____________________________________________   LABS (all labs ordered are listed, but only abnormal results are displayed)  Labs Reviewed  BASIC METABOLIC PANEL - Abnormal; Notable for the following components:      Result Value   Sodium 133 (*)    Potassium 3.4 (*)    Chloride 97 (*)    Glucose, Bld 119 (*)    All other components within normal limits  CBC WITH DIFFERENTIAL/PLATELET - Abnormal; Notable for the following components:   WBC 12.1 (*)    RBC 3.50 (*)    Hemoglobin 10.8 (*)    HCT 33.9 (*)    Neutro Abs 9.2 (*)    Monocytes Absolute 1.3 (*)    Abs Immature Granulocytes 0.08 (*)  All other components within normal limits  RESP PANEL BY RT-PCR (FLU A&B, COVID) ARPGX2  HEPATIC FUNCTION PANEL  TROPONIN I (HIGH SENSITIVITY)  TROPONIN I (HIGH SENSITIVITY)    ____________________________________________   ED ECG REPORT I, Vanessa [place], the attending physician, personally viewed and interpreted this ECG.  Normal sinus rate 95 no ST elevation, no T wave versions, normal intervals ____________________________________________  RADIOLOGY Robert Bellow, personally viewed and evaluated these images (plain radiographs) as part of my medical decision making, as well as reviewing the written report by the radiologist.  ED MD interpretation: No pneumonia  Official radiology report(s): DG Chest 2 View  Result Date: 01/19/2021 CLINICAL DATA:  Short of breath. EXAM: CHEST - 2 VIEW COMPARISON:  09/05/2020. FINDINGS: Cardiac silhouette is normal in size. No mediastinal or hilar masses or evidence of adenopathy. Right anterior chest wall internal jugular Port-A-Cath is stable. Clear lungs.  No pleural effusion or pneumothorax. Skeletal structures are intact. IMPRESSION: No active cardiopulmonary disease. Electronically Signed   By: Lajean Manes M.D.   On: 01/19/2021 13:43    ____________________________________________   PROCEDURES  Procedure(s) performed (including Critical Care):  .Critical Care Performed by: Vanessa [place], MD Authorized by: Vanessa [place], MD   Critical care provider statement:    Critical care time (minutes):  30   Critical care was necessary to treat or prevent imminent or life-threatening deterioration of the following conditions:  Respiratory failure   Critical care was time spent personally by me on the following activities:  Development of treatment plan with patient or surrogate, discussions with consultants, evaluation of patient's response to treatment, examination of patient, ordering and review of laboratory studies, ordering and review of radiographic studies, ordering and performing treatments and interventions, pulse oximetry, re-evaluation of patient's condition and review of old  charts   ____________________________________________   INITIAL IMPRESSION / ASSESSMENT AND PLAN / ED COURSE   SHAQUALA BROEKER was evaluated in Emergency Department on 01/19/2021 for the symptoms described in the history of present illness. She was evaluated in the context of the global COVID-19 pandemic, which necessitated consideration that the patient might be at risk for infection with the SARS-CoV-2 virus that causes COVID-19. Institutional protocols and algorithms that pertain to the evaluation of patients at risk for COVID-19 are in a state of rapid change based on information released by regulatory bodies including the CDC and federal and state organizations. These policies and algorithms were followed during the patient's care in the ED.     Pt presents with SOB. Differential includes: PNA-will get xray to evaluation Anemia-CBC to evaluate ACS- will get trops Arrhythmia-Will get EKG and keep on monitor.  COVID- will get testing per algorithm. PE-concerning given negative chest x-ray and patient shortness of breath  Initially patient was reported to have some wheezing in triage but upon my assessment after DuoNeb there was no wheezing  No evidence of ACS.  No evidence of anemia.  6:45 PM pt now has wheezing given 2 duonebs, steroids.   Pt drops to 89% with ambulation. Breathing through pursed lips.  CT concerning for bronchitis. Will admit for respiratory failure and place on 2L oxygen.   ____________________________________________   FINAL CLINICAL IMPRESSION(S) / ED DIAGNOSES   Final diagnoses:  Bronchitis  Acute respiratory failure with hypoxia ([place])     MEDICATIONS GIVEN DURING THIS VISIT:  Medications  ipratropium-albuterol (DUONEB) 0.5-2.5 (3) MG/3ML nebulizer solution 3 mL (has no administration in time range)  ipratropium-albuterol (DUONEB) 0.5-2.5 (  3) MG/3ML nebulizer solution 3 mL (has no administration in time range)  ipratropium-albuterol (DUONEB) 0.5-2.5  (3) MG/3ML nebulizer solution 3 mL (3 mLs Nebulization Given 01/19/21 1315)  iohexol (OMNIPAQUE) 350 MG/ML injection 75 mL (75 mLs Intravenous Contrast Given 01/19/21 1741)  methylPREDNISolone sodium succinate (SOLU-MEDROL) 125 mg/2 mL injection 125 mg (125 mg Intravenous Given 01/19/21 1845)     ED Discharge Orders     None        Note:  This document was prepared using Dragon voice recognition software and may include unintentional dictation errors.   Vanessa Coleman, MD 01/19/21 [phone]

## 2021-01-19 NOTE — ED Triage Notes (Signed)
Pt via POV from home c/o SOB since Tuesday that started getting worse. Pt also c/o chest tightness. Pt has a hx of ovarian cancer and currently on radiation. Pt is A&OX4 and NAD.

## 2021-01-19 NOTE — ED Notes (Signed)
This RN rechecked O2 saturation 89% on RA, pt placed on 2L [place] and increased to 96%.

## 2021-01-19 NOTE — ED Notes (Signed)
Pt and daughter finishing eating in room per their request

## 2021-01-19 NOTE — ED Provider Notes (Signed)
°  Emergency Medicine Provider Triage Evaluation Note  EUNICE WINECOFF , a 70 y.o.female,  was evaluated in triage.  Pt complains of shortness of breath.  Patient states that she has been feeling very short of breath for the past week.  She has a history of emphysema.  She states that her oxygen saturation was approximately 86% yesterday.  She was seen at a clinic 2 days ago where they tested for flu/COVID.  Both were negative.  They prescribed her an inhaler, but she has not been able to pick it up.  Denies fever/chills, chest pain, abdominal pain, back pain, or urinary symptoms.   Review of Systems  Positive: Shortness of breath Negative: Denies fever, chest pain, vomiting  Physical Exam  There were no vitals filed for this visit. Gen:   Awake, uncomfortable Resp:  Labored breathing MSK:   Moves extremities without difficulty  Other:    Medical Decision Making  Given the patient's initial medical screening exam, the following diagnostic evaluation has been ordered. The patient will be placed in the appropriate treatment space, once one is available, to complete the evaluation and treatment. I have discussed the plan of care with the patient and I have advised the patient that an ED physician or mid-level practitioner will reevaluate their condition after the test results have been received, as the results may give them additional insight into the type of treatment they may need.    Diagnostics: Labs, EKG, CXR  Treatments: Nena Alexander, [place] 01/19/21 1248    Vanessa [place], MD 01/19/21 [phone]

## 2021-01-19 NOTE — Progress Notes (Signed)
PHARMACIST - PHYSICIAN COMMUNICATION  CONCERNING:  Enoxaparin (Lovenox) for DVT Prophylaxis    RECOMMENDATION: Patient was prescribed enoxaprin 40mg  q24 hours for VTE prophylaxis.   Filed Weights   01/19/21 1252  Weight: 106.6 kg (235 lb)    Body mass index is 41.63 kg/m.  Estimated Creatinine Clearance: 72.5 mL/min (by C-G formula based on SCr of 0.82 mg/dL).   Based on [place] patient is candidate for enoxaparin 0.5mg /kg TBW SQ every 24 hours based on BMI being >30.   DESCRIPTION: Pharmacy has adjusted enoxaparin dose per [facility] policy.  Patient is now receiving enoxaparin 52.5 mg every 24 hours    Sherilyn Banker, PharmD Clinical Pharmacist  01/19/2021 7:03 PM

## 2021-01-19 NOTE — ED Notes (Signed)
Informed RN bed assigned 

## 2021-01-20 DIAGNOSIS — C50412 Malignant neoplasm of upper-outer quadrant of left female breast: Secondary | ICD-10-CM | POA: Diagnosis present

## 2021-01-20 DIAGNOSIS — Z825 Family history of asthma and other chronic lower respiratory diseases: Secondary | ICD-10-CM | POA: Diagnosis not present

## 2021-01-20 DIAGNOSIS — Z87891 Personal history of nicotine dependence: Secondary | ICD-10-CM | POA: Diagnosis not present

## 2021-01-20 DIAGNOSIS — E7800 Pure hypercholesterolemia, unspecified: Secondary | ICD-10-CM | POA: Diagnosis present

## 2021-01-20 DIAGNOSIS — F411 Generalized anxiety disorder: Secondary | ICD-10-CM | POA: Diagnosis present

## 2021-01-20 DIAGNOSIS — Z9221 Personal history of antineoplastic chemotherapy: Secondary | ICD-10-CM | POA: Diagnosis not present

## 2021-01-20 DIAGNOSIS — Z8249 Family history of ischemic heart disease and other diseases of the circulatory system: Secondary | ICD-10-CM | POA: Diagnosis not present

## 2021-01-20 DIAGNOSIS — F329 Major depressive disorder, single episode, unspecified: Secondary | ICD-10-CM | POA: Diagnosis present

## 2021-01-20 DIAGNOSIS — Z20822 Contact with and (suspected) exposure to covid-19: Secondary | ICD-10-CM | POA: Diagnosis present

## 2021-01-20 DIAGNOSIS — E876 Hypokalemia: Secondary | ICD-10-CM | POA: Diagnosis present

## 2021-01-20 DIAGNOSIS — Z803 Family history of malignant neoplasm of breast: Secondary | ICD-10-CM | POA: Diagnosis not present

## 2021-01-20 DIAGNOSIS — J40 Bronchitis, not specified as acute or chronic: Secondary | ICD-10-CM | POA: Diagnosis present

## 2021-01-20 DIAGNOSIS — I10 Essential (primary) hypertension: Secondary | ICD-10-CM | POA: Diagnosis present

## 2021-01-20 DIAGNOSIS — J439 Emphysema, unspecified: Secondary | ICD-10-CM | POA: Diagnosis present

## 2021-01-20 DIAGNOSIS — Z808 Family history of malignant neoplasm of other organs or systems: Secondary | ICD-10-CM | POA: Diagnosis not present

## 2021-01-20 DIAGNOSIS — J700 Acute pulmonary manifestations due to radiation: Secondary | ICD-10-CM | POA: Diagnosis present

## 2021-01-20 DIAGNOSIS — E559 Vitamin D deficiency, unspecified: Secondary | ICD-10-CM | POA: Diagnosis present

## 2021-01-20 DIAGNOSIS — Z882 Allergy status to sulfonamides status: Secondary | ICD-10-CM | POA: Diagnosis not present

## 2021-01-20 DIAGNOSIS — Z888 Allergy status to other drugs, medicaments and biological substances status: Secondary | ICD-10-CM | POA: Diagnosis not present

## 2021-01-20 DIAGNOSIS — Z79899 Other long term (current) drug therapy: Secondary | ICD-10-CM | POA: Diagnosis not present

## 2021-01-20 DIAGNOSIS — Z66 Do not resuscitate: Secondary | ICD-10-CM | POA: Diagnosis present

## 2021-01-20 DIAGNOSIS — L309 Dermatitis, unspecified: Secondary | ICD-10-CM | POA: Diagnosis present

## 2021-01-20 DIAGNOSIS — J209 Acute bronchitis, unspecified: Secondary | ICD-10-CM | POA: Diagnosis present

## 2021-01-20 DIAGNOSIS — Y842 Radiological procedure and radiotherapy as the cause of abnormal reaction of the patient, or of later complication, without mention of misadventure at the time of the procedure: Secondary | ICD-10-CM | POA: Diagnosis present

## 2021-01-20 DIAGNOSIS — Z170 Estrogen receptor positive status [ER+]: Secondary | ICD-10-CM | POA: Diagnosis not present

## 2021-01-20 DIAGNOSIS — J9621 Acute and chronic respiratory failure with hypoxia: Secondary | ICD-10-CM | POA: Diagnosis present

## 2021-01-20 LAB — RESPIRATORY PANEL BY PCR

## 2021-01-20 LAB — CBC
HCT: 32.3 % — ABNORMAL LOW (ref 36.0–46.0)
Hemoglobin: 10.5 g/dL — ABNORMAL LOW (ref 12.0–15.0)
MCH: 31 pg (ref 26.0–34.0)
MCHC: 32.5 g/dL (ref 30.0–36.0)
MCV: 95.3 fL (ref 80.0–100.0)
Platelets: 244 10*3/uL (ref 150–400)
RBC: 3.39 MIL/uL — ABNORMAL LOW (ref 3.87–5.11)
RDW: 13.7 % (ref 11.5–15.5)
WBC: 10 10*3/uL (ref 4.0–10.5)
nRBC: 0 % (ref 0.0–0.2)

## 2021-01-20 LAB — BASIC METABOLIC PANEL
Anion gap: 6 (ref 5–15)
BUN: 20 mg/dL (ref 8–23)
CO2: 30 mmol/L (ref 22–32)
Calcium: 9.4 mg/dL (ref 8.9–10.3)
Chloride: 99 mmol/L (ref 98–111)
Creatinine, Ser: 0.77 mg/dL (ref 0.44–1.00)
GFR, Estimated: >60 mL/min (ref >60)
Glucose, Bld: 168 mg/dL — ABNORMAL HIGH (ref 70–99)
Potassium: 3.8 mmol/L (ref 3.5–5.1)
Sodium: 135 mmol/L (ref 135–145)

## 2021-01-20 MED ORDER — CHLORHEXIDINE GLUCONATE CLOTH 2 % EX PADS
6.0000 | MEDICATED_PAD | Freq: Every day | CUTANEOUS | Status: DC
  Administered 2021-01-20 – 2021-01-22 (×3): 6 via TOPICAL

## 2021-01-20 MED ORDER — GUAIFENESIN-DM 100-10 MG/5ML PO SYRP
5.0000 mL | ORAL_SOLUTION | ORAL | Status: DC | PRN
  Administered 2021-01-20 – 2021-01-22 (×6): 5 mL via ORAL
  Filled 2021-01-20 (×6): qty 5

## 2021-01-20 MED ORDER — AZITHROMYCIN 500 MG PO TABS
500.0000 mg | ORAL_TABLET | Freq: Every day | ORAL | Status: AC
  Administered 2021-01-20: 09:00:00 500 mg via ORAL
  Filled 2021-01-20: qty 1

## 2021-01-20 MED ORDER — AZITHROMYCIN 500 MG PO TABS
250.0000 mg | ORAL_TABLET | Freq: Every day | ORAL | Status: DC
  Administered 2021-01-21 – 2021-01-22 (×2): 250 mg via ORAL
  Filled 2021-01-20 (×2): qty 1

## 2021-01-20 NOTE — Progress Notes (Signed)
PROGRESS NOTE    Victoria Dean  NGE:[id number] DOB: February 16, 1950 DOA: 01/19/2021 PCP: Jerrol Banana., MD   Brief Narrative: Taken from H&P. Victoria Dean is a 70 y.o. female with medical history significant for History of left upper outer quadrant breast cancer, ER positive (chemotherapy completed) now and radiation 5 days per week, via right anterior chest wall IJ Port-A-Cath, anxiety, hypertension, osteoarthritis, depression, who presents emergency department for chief concerns of shortness of breath. On arrival she was hemodynamically stable except mild hypoxia at 86% with ambulation and she was placed on 2 L of oxygen with resultant improvement. COVID-19 PCR and influenza PCR was negative. CTA was negative for PE but there was some mild bronchial wall thickening and scattered small likely reactive hilar lymphadenopathy, most likely underlying bronchitis. She was started on steroid and breathing treatment. Checking respiratory viral panel  Subjective: Patient was seen and examined today.  She thinks that she is improving but continues to desaturate with ambulation.  No prior oxygen use.  Still having some cough.  Assessment & Plan:   Principal Problem:   Acute on chronic respiratory failure with hypoxia (HCC) Active Problems:   Clinical depression   Dermatitis, eczematoid   Essential (primary) hypertension   Anxiety, generalized   Arthritis, degenerative   Malignant neoplasm of upper-outer quadrant of left breast in female, estrogen receptor positive ([place])   Upper respiratory infection with cough and congestion  Acute hypoxic respiratory failure.  Most likely secondary to bronchitis.  Influenza and COVID PCR negative.  No prior diagnosis of COPD but patient is getting left sided radiation for her breast cancer. Continue to desaturate with ambulation. -Continue with every 6 hourly DuoNeb. -Continue with Solu-Medrol today. -Add Zithromax -Continue with supportive  care -Continue with supplemental oxygen-wean as tolerated  History of generalized anxiety.  -Continue home dose of p.o. Valium as needed  Depression. -Continue home dose of venlafaxine.  Hypertension.  Blood pressure within goal. -Continue home dose of losartan -Continue holding home dose of HCTZ and triamterene-we can resume if needed  History of breast cancer. -Continue with outpatient oncology  Objective: Vitals:   01/19/21 2048 01/20/21 0515 01/20/21 0808 01/20/21 1312  BP: 131/60 (!) 120/51 133/60 (!) 118/47  Pulse: (!) 101 88 96 97  Resp: 20 20 18 16   Temp: 98 F (36.7 C) 98 F (36.7 C) 98.1 F (36.7 C) 98.2 F (36.8 C)  TempSrc: Oral Oral  Oral  SpO2: 96% 96% 99% 97%  Weight:      Height:       No intake or output data in the 24 hours ending 01/20/21 1500 Filed Weights   01/19/21 1252  Weight: 106.6 kg    Examination:  General exam: Appears calm and comfortable  Respiratory system: Few scattered wheeze on the left. Respiratory effort normal. Cardiovascular system: S1 & S2 heard, RRR.  Gastrointestinal system: Soft, nontender, nondistended, bowel sounds positive. Central nervous system: Alert and oriented. No focal neurological deficits.Symmetric 5 x 5 power. Extremities: No edema, no cyanosis, pulses intact and symmetrical. Psychiatry: Judgement and insight appear normal. Mood & affect appropriate.    DVT prophylaxis: Lovenox Code Status: DNR Family Communication: Discussed with daughter at bedside Disposition Plan:  Status is: Inpatient  Remains inpatient appropriate because: Severity of illness   Level of care: Telemetry Medical  All the records are reviewed and case discussed with Care Management/Social Worker. Management plans discussed with the patient, nursing and they are in agreement.  Consultants:  None  Procedures:  Antimicrobials:  Zithromax  Data Reviewed: I have personally reviewed following labs and imaging  studies  CBC: Recent Labs  Lab 01/18/21 0928 01/19/21 1314 01/20/21 0423  WBC 11.7* 12.1* 10.0  NEUTROABS  --  9.2*  --   HGB 11.0* 10.8* 10.5*  HCT 34.1* 33.9* 32.3*  MCV 96.3 96.9 95.3  PLT 244 233 789   Basic Metabolic Panel: Recent Labs  Lab 01/19/21 1314 01/19/21 1713 01/20/21 0423  NA 133*  --  135  K 3.4*  --  3.8  CL 97*  --  99  CO2 28  --  30  GLUCOSE 119*  --  168*  BUN 15  --  20  CREATININE 0.82  --  0.77  CALCIUM 9.3  --  9.4  MG  --  2.3  --   PHOS  --  3.5  --    GFR: Estimated Creatinine Clearance: 74.4 mL/min (by C-G formula based on SCr of 0.77 mg/dL). Liver Function Tests: Recent Labs  Lab 01/19/21 1710  AST 16  ALT 20  ALKPHOS 126  BILITOT 0.5  PROT 7.3  ALBUMIN 3.6   No results for input(s): LIPASE, AMYLASE in the last 168 hours. No results for input(s): AMMONIA in the last 168 hours. Coagulation Profile: No results for input(s): INR, PROTIME in the last 168 hours. Cardiac Enzymes: No results for input(s): CKTOTAL, CKMB, CKMBINDEX, TROPONINI in the last 168 hours. BNP (last 3 results) No results for input(s): PROBNP in the last 8760 hours. HbA1C: No results for input(s): HGBA1C in the last 72 hours. CBG: No results for input(s): GLUCAP in the last 168 hours. Lipid Profile: No results for input(s): CHOL, HDL, LDLCALC, TRIG, CHOLHDL, LDLDIRECT in the last 72 hours. Thyroid Function Tests: Recent Labs    01/19/21 1713  TSH 0.538   Anemia Panel: No results for input(s): VITAMINB12, FOLATE, FERRITIN, TIBC, IRON, RETICCTPCT in the last 72 hours. Sepsis Labs: No results for input(s): PROCALCITON, LATICACIDVEN in the last 168 hours.  Recent Results (from the past 240 hour(s))  COVID-19, Flu A+B and RSV     Status: None   Collection Time: 01/17/21 12:00 AM   Specimen: Nasal Swab   Nasal Swab  Previously  Result Value Ref Range Status   SARS-CoV-2, NAA Not Detected Not Detected Final   Influenza A, NAA Not Detected Not Detected  Final   Influenza B, NAA Not Detected Not Detected Final   RSV, NAA Not Detected Not Detected Final   Test Information: Comment  Final    Comment: This nucleic acid amplification test was developed and its performance characteristics determined by [employer]. Nucleic acid amplification tests include RT-PCR and TMA. This test has not been FDA cleared or approved. This test has been authorized by FDA under an Emergency Use Authorization (EUA). This test is only authorized for the duration of time the declaration that circumstances exist justifying the authorization of the emergency use of in vitro diagnostic tests for detection of SARS-CoV-2 virus and/or diagnosis of COVID-19 infection under section 564(b)(1) of the Act, 21 U.S.C. 381OFB-5(Z) (1), unless the authorization is terminated or revoked sooner. When diagnostic testing is negative, the possibility of a false negative result should be considered in the context of a patient's recent exposures and the presence of clinical signs and symptoms consistent with COVID-19. An individual without symptoms of COVID-19 and who is not shedding SARS-CoV-2 virus wo uld expect to have a negative (not detected) result in this assay.  Resp Panel by RT-PCR (Flu A&B, Covid) Nasopharyngeal Swab     Status: None   Collection Time: 01/19/21  5:10 PM   Specimen: Nasopharyngeal Swab; Nasopharyngeal(NP) swabs in vial transport medium  Result Value Ref Range Status   SARS Coronavirus 2 by RT PCR NEGATIVE NEGATIVE Final    Comment: (NOTE) SARS-CoV-2 target nucleic acids are NOT DETECTED.  The SARS-CoV-2 RNA is generally detectable in upper respiratory specimens during the acute phase of infection. The lowest concentration of SARS-CoV-2 viral copies this assay can detect is 138 copies/mL. A negative result does not preclude SARS-Cov-2 infection and should not be used as the sole basis for treatment or other patient management decisions. A  negative result may occur with  improper specimen collection/handling, submission of specimen other than nasopharyngeal swab, presence of viral mutation(s) within the areas targeted by this assay, and inadequate number of viral copies(<138 copies/mL). A negative result must be combined with clinical observations, patient history, and epidemiological information. The expected result is Negative.  Fact Sheet for Patients:  EntrepreneurPulse.com.au  Fact Sheet for Healthcare Providers:  IncredibleEmployment.be  This test is no t yet approved or cleared by the [place] FDA and  has been authorized for detection and/or diagnosis of SARS-CoV-2 by FDA under an Emergency Use Authorization (EUA). This EUA will remain  in effect (meaning this test can be used) for the duration of the COVID-19 declaration under Section 564(b)(1) of the Act, 21 U.S.C.section 360bbb-3(b)(1), unless the authorization is terminated  or revoked sooner.       Influenza A by PCR NEGATIVE NEGATIVE Final   Influenza B by PCR NEGATIVE NEGATIVE Final    Comment: (NOTE) The Xpert Xpress SARS-CoV-2/FLU/RSV plus assay is intended as an aid in the diagnosis of influenza from Nasopharyngeal swab specimens and should not be used as a sole basis for treatment. Nasal washings and aspirates are unacceptable for Xpert Xpress SARS-CoV-2/FLU/RSV testing.  Fact Sheet for Patients: EntrepreneurPulse.com.au  Fact Sheet for Healthcare Providers: IncredibleEmployment.be  This test is not yet approved or cleared by the [place] FDA and has been authorized for detection and/or diagnosis of SARS-CoV-2 by FDA under an Emergency Use Authorization (EUA). This EUA will remain in effect (meaning this test can be used) for the duration of the COVID-19 declaration under Section 564(b)(1) of the Act, 21 U.S.C. section 360bbb-3(b)(1), unless the authorization  is terminated or revoked.  Performed at [facility], [address] [place], [place] [address]      Radiology Studies: DG Chest 2 View  Result Date: 01/19/2021 CLINICAL DATA:  Short of breath. EXAM: CHEST - 2 VIEW COMPARISON:  09/05/2020. FINDINGS: Cardiac silhouette is normal in size. No mediastinal or hilar masses or evidence of adenopathy. Right anterior chest wall internal jugular Port-A-Cath is stable. Clear lungs.  No pleural effusion or pneumothorax. Skeletal structures are intact. IMPRESSION: No active cardiopulmonary disease. Electronically Signed   By: Lajean Manes M.D.   On: 01/19/2021 13:43   CT Angio Chest PE W and/or Wo Contrast  Addendum Date: 01/19/2021   ADDENDUM REPORT: 01/19/2021 19:19 ADDENDUM: Postsurgical changes are noted in the left breast similar in appearance to that seen on the prior exam consistent with the given clinical history. Electronically Signed   By: Inez Catalina M.D.   On: 01/19/2021 19:19   Result Date: 01/19/2021 CLINICAL DATA:  Shortness of breath for 1 week, initial encounter EXAM: CT ANGIOGRAPHY CHEST WITH CONTRAST TECHNIQUE: Multidetector CT imaging of the chest was performed using  the standard protocol during bolus administration of intravenous contrast. Multiplanar CT image reconstructions and MIPs were obtained to evaluate the vascular anatomy. CONTRAST:  56mL OMNIPAQUE IOHEXOL 350 MG/ML SOLN COMPARISON:  Chest x-ray from earlier in the same day. FINDINGS: Cardiovascular: Atherosclerotic calcifications of the thoracic aorta are noted. No aneurysmal dilatation or dissection is seen. No cardiac enlargement is noted. The pulmonary artery shows a normal branching pattern bilaterally. No filling defects are identified to suggest pulmonary embolism. Scattered coronary calcifications are noted. Mediastinum/Nodes: Thoracic inlet is within normal limits. Right-sided chest wall port is noted in satisfactory position. Scattered small hilar lymph  nodes are seen new from the prior exam but not significant by size criteria. No mediastinal adenopathy is noted. The esophagus as visualized is within normal limits. Lungs/Pleura: Lungs are well aerated bilaterally. No focal infiltrate or sizable effusion is seen. Mild bronchial wall thickening is noted throughout both lungs likely representing a degree of bronchitis. No sizable parenchymal nodules are noted. Upper Abdomen: Fatty infiltration of the liver is noted. The remainder of the upper abdomen is within normal limits. Musculoskeletal: Degenerative changes of the thoracic spine are noted. No acute rib abnormality is seen. Review of the MIP images confirms the above findings. IMPRESSION: No evidence of pulmonary emboli. Mild bronchial wall thickening and scattered small likely reactive hilar nodes bilaterally. These changes are likely related to underlying bronchitis. Fatty liver. Aortic Atherosclerosis (ICD10-I70.0). Electronically Signed: By: Inez Catalina M.D. On: 01/19/2021 18:07    Scheduled Meds:  [START ON 01/21/2021] azithromycin  250 mg Oral Daily   Chlorhexidine Gluconate Cloth  6 each Topical Daily   chlorpheniramine-HYDROcodone  5 mL Oral QHS   enoxaparin (LOVENOX) injection  0.5 mg/kg Subcutaneous Q24H   ipratropium-albuterol  3 mL Nebulization TID   losartan  100 mg Oral Daily   methylPREDNISolone (SOLU-MEDROL) injection  80 mg Intravenous Daily   venlafaxine XR  225 mg Oral Q breakfast   Continuous Infusions:   LOS: 0 days   Time spent: 40 minutes. More than 50% of the time was spent in counseling/coordination of care  Lorella Nimrod, MD [facility]  If 7PM-7AM, please contact night-coverage [email or web address]  01/20/2021, 3:00 PM   This record has been created using Systems analyst. Errors have been sought and corrected,but may not always be located. Such creation errors do not reflect on the standard of care.

## 2021-01-21 ENCOUNTER — Ambulatory Visit: Payer: Medicare PPO

## 2021-01-21 ENCOUNTER — Telehealth: Payer: Self-pay

## 2021-01-21 MED ORDER — PREDNISONE 20 MG PO TABS: 20.0000 mg | ORAL_TABLET | Freq: Every day | ORAL | 0 refills | Status: AC

## 2021-01-21 MED ORDER — GUAIFENESIN-DM 100-10 MG/5ML PO SYRP: 5.0000 mL | ORAL_SOLUTION | ORAL | 0 refills | Status: DC | PRN

## 2021-01-21 MED ORDER — AZITHROMYCIN 250 MG PO TABS
250.0000 mg | ORAL_TABLET | Freq: Every day | ORAL | 0 refills | Status: AC
Start: 2021-01-21 — End: 2021-01-25

## 2021-01-21 NOTE — Telephone Encounter (Signed)
Pt is currently admitted to ED at [facility]. Per Dr.Chrystal he wanted to hold Tx until Pt is out of the hosptial. I called and spoke with Pt and told her to let us know when she is discharged and back at home. Pt understood.

## 2021-01-21 NOTE — Telephone Encounter (Signed)
LMTCB 01/21/2021.  PEC please schedule pt for 01/29/2021 with Dr. Caryn Section (as below)   Thanks,   -Mickel Baas

## 2021-01-21 NOTE — Telephone Encounter (Signed)
FYI...   I think pt is admitted at [facility] right now.

## 2021-01-21 NOTE — TOC Initial Note (Signed)
Transition of Care [facility]) - Initial/Assessment Note    Patient Details  Name: Victoria Dean MRN: [id number] Date of Birth: December 29, 1950  [facility]) CM/SW Contact:    Pete Pelt, RN Phone Number: 01/21/2021, 2:27 PM  Clinical Narrative:     Patient dischargng to daughter's house to facilitate assistance with care if needed.  Patient has 3 family members who can potentially assist with care, transport and pharmacy needs.  Patient can take medications as directed.            The only concern patient has for discharge today is that she feels like she has dyspnea and would like re-evaluation and potential nebulizer at home.  Care team to reassess SATs tOC to follow for needs        Patient Goals and CMS Choice        Expected Discharge Plan and Services           Expected Discharge Date: 01/21/21                                    Prior Living Arrangements/Services                       Activities of Daily Living Home Assistive Devices/Equipment: Kasandra Knudsen (specify quad or straight) ADL Screening (condition at time of admission) Patient's cognitive ability adequate to safely complete daily activities?: Yes Is the patient deaf or have difficulty hearing?: No Does the patient have difficulty seeing, even when wearing glasses/contacts?: No Does the patient have difficulty concentrating, remembering, or making decisions?: No Patient able to express need for assistance with ADLs?: No Does the patient have difficulty dressing or bathing?: No Independently performs ADLs?: Yes (appropriate for developmental age) Does the patient have difficulty walking or climbing stairs?: No Weakness of Legs: Right (b/c of 4 back surgeries) Weakness of Arms/Hands: None  Permission Sought/Granted                  Emotional Assessment              Admission diagnosis:  Bronchitis [J40] Acute respiratory failure with hypoxia ([place]) [J96.01] Acute on chronic  respiratory failure with hypoxia ([place]) [J96.21] Patient Active Problem List   Diagnosis Date Noted   Acute on chronic respiratory failure with hypoxia ([place]) 01/19/2021   Upper respiratory infection with cough and congestion 01/17/2021   B12 deficiency 10/09/2020   Genetic testing 06/26/2020   Family history of breast cancer    Family history of stomach cancer    Malignant neoplasm of upper-outer quadrant of left breast in female, estrogen receptor positive ([place]) 06/10/2020   Goals of care, counseling/discussion 06/10/2020   Impingement syndrome of right shoulder region 05/06/2019   Low back pain 01/24/2019   Personal history of tobacco use, presenting hazards to health 02/23/2018   History of lumbar laminectomy 04/30/2016   Actinic keratosis 11/21/2014   Adaptation reaction 11/21/2014   Allergic rhinitis 11/21/2014   Cornu cutaneum 11/21/2014   Clinical depression 11/21/2014   Dermatitis, eczematoid 11/21/2014   Enthesopathy 11/21/2014   Essential (primary) hypertension 11/21/2014   Anxiety, generalized 11/21/2014   Herniated nucleus pulposus 11/21/2014   Mild episode of recurrent major depressive disorder ([place]) 11/21/2014   Adiposity 11/21/2014   Arthritis, degenerative 11/21/2014   Basal cell papilloma 11/21/2014   Apnea, sleep 11/21/2014   Avitaminosis D 11/21/2014   PCP:  Jerrol Banana., MD Pharmacy:   [facility] [place], [place] [address] Phone: [phone] Fax: [phone]  [place], [place] - [address] [place] [address] Phone: [phone] Fax: [phone]     Social Determinants of Health (SDOH) Interventions    Readmission Risk Interventions No flowsheet data found.

## 2021-01-21 NOTE — Telephone Encounter (Signed)
Copied from [place] [id number]. Topic: General - Other >> [name] 18, 2021  4:13 PM Victoria Dean wrote: Reason for CRM: The patient has called to request Dean, previously discussed, prescription for an inhaler to [place], [place]  Phone:  [phone] Fax:  [phone]  The patient shares that they are unable to be prescribed BREZTRI due to the chest discomfort it creates for them   Please contact further when possible

## 2021-01-21 NOTE — Telephone Encounter (Signed)
The soonest I could see her would be Tuesday the 27th, she could have one of my same day slots on that day.

## 2021-01-21 NOTE — Telephone Encounter (Signed)
Copied from [place] [id number]. Topic: Appointment Scheduling - Scheduling Inquiry for Clinic >> [name] 21, 2021  1:51 PM McGill, Nelva Bush wrote: Reason for CRM: Helene Kelp from [facility] stated patient needs to schedule a hospital follow up needs to be seen within a week.  Please advise unable to locate any appointments within this timeframe.   Please call [name]  (daughter) [phone]

## 2021-01-21 NOTE — Discharge Summary (Signed)
Physician Discharge Summary  Victoria Dean XNA:[id number] DOB: 09-Sep-1950 DOA: 01/19/2021  PCP: Jerrol Banana., MD  Admit date: 01/19/2021 Discharge date: 01/22/2021  Admitted From: Home Disposition: Home  Recommendations for Outpatient Follow-up:  Follow up with PCP in 1-2 weeks Please obtain BMP/CBC in one week Please follow up on the following pending results: None  Home Health: No Equipment/Devices: None Discharge Condition: Stable CODE STATUS: DNR Diet recommendation: Heart Healthy   Brief/Interim Summary: Victoria Dean is a 70 y.o. female with medical history significant for History of left upper outer quadrant breast cancer, ER positive (chemotherapy completed) now and radiation 5 days per week, via right anterior chest wall IJ Port-A-Cath, anxiety, hypertension, osteoarthritis, depression, who presents emergency department for chief concerns of shortness of breath. On arrival she was hemodynamically stable except mild hypoxia at 86% with ambulation and she was placed on 2 L of oxygen with resultant improvement. COVID-19 PCR and influenza PCR was negative. CTA was negative for PE but there was some mild bronchial wall thickening and scattered small likely reactive hilar lymphadenopathy, most likely underlying bronchitis. She was started on steroid and breathing treatment. Respiratory viral panel negative.  There was some concern of radiation-induced pneumonitis.  Waxing and waning oxygen requirement, becoming hypoxic to high 80s on room air.  She was discharged home on steroid and some oxygen and need to follow-up with pulmonologist as an outpatient.  She will continue the rest of her home medications and follow-up with her providers.  Discharge Diagnoses:  Principal Problem:   Acute on chronic respiratory failure with hypoxia (HCC) Active Problems:   Clinical depression   Dermatitis, eczematoid   Essential (primary) hypertension   Anxiety, generalized   Arthritis,  degenerative   Malignant neoplasm of upper-outer quadrant of left breast in female, estrogen receptor positive ([place])   Upper respiratory infection with cough and congestion   Discharge Instructions  Discharge Instructions     Diet - low sodium heart healthy   Complete by: As directed    Discharge instructions   Complete by: As directed    It was pleasure taking care of you. You are being given steroids for few days, please take it as directed. Continue taking rest of your medications and follow-up with your doctors.   Increase activity slowly   Complete by: As directed       Allergies as of 01/22/2021       Reactions   Other Itching   Brand name bandaids with cloth backing causes redness and itching   Statins    Muscle weakness   Zetia [ezetimibe]    Leg pain, upset stomach   Sulfa Antibiotics Rash        Medication List     STOP taking these medications    cyanocobalamin 1000 MCG/ML injection Commonly known as: (VITAMIN B-12)       TAKE these medications    acetaminophen 650 MG CR tablet Commonly known as: TYLENOL Take 1,300 mg by mouth every 8 (eight) hours as needed for pain.   azithromycin 250 MG tablet Commonly known as: ZITHROMAX Take 1 tablet (250 mg total) by mouth daily for 4 days.   benzonatate 100 MG capsule Commonly known as: TESSALON Take 1 capsule (100 mg total) by mouth 2 (two) times daily as needed for up to 10 days for cough.   BIOFREEZE EX Apply 1 application topically daily as needed (back pain).   Biotin 1000 MCG tablet Take 1,000 mcg by mouth daily.  cholecalciferol 25 MCG (1000 UNIT) tablet Commonly known as: VITAMIN D Take 1,000 Units by mouth daily.   diazepam 5 MG tablet Commonly known as: VALIUM Take 1 tablet (5 mg total) by mouth every 12 (twelve) hours as needed for anxiety.   guaiFENesin-dextromethorphan 100-10 MG/5ML syrup Commonly known as: ROBITUSSIN DM Take 5 mLs by mouth every 4 (four) hours as needed for  cough.   hydrocortisone 2.5 % lotion Apply 1 application topically daily as needed (when washing face).   ipratropium-albuterol 0.5-2.5 (3) MG/3ML Soln Commonly known as: DUONEB Take 3 mLs by nebulization every 4 (four) hours as needed (For shorness of breath).   lidocaine-prilocaine cream Commonly known as: EMLA Apply to affected area once   losartan 100 MG tablet Commonly known as: COZAAR TAKE 1 TABLET (100 MG TOTAL) BY MOUTH DAILY.   mometasone 0.1 % cream Commonly known as: ELOCON Apply 1 application topically daily as needed (ear irritation).   ondansetron 8 MG tablet Commonly known as: Zofran Take 1 tablet (8 mg total) by mouth 2 (two) times daily as needed (Nausea or vomiting).   potassium chloride SA 20 MEQ tablet Commonly known as: KLOR-CON M Take 1 tablet (20 mEq total) by mouth daily.   predniSONE 20 MG tablet Commonly known as: DELTASONE Take 1 tablet (20 mg total) by mouth daily with breakfast for 5 days.   prochlorperazine 10 MG tablet Commonly known as: COMPAZINE Take 1 tablet (10 mg total) by mouth every 6 (six) hours as needed (Nausea or vomiting).   rosuvastatin 5 MG tablet Commonly known as: CRESTOR TAKE 1 TABLET (5 MG TOTAL) BY MOUTH DAILY.   SYSTANE OP Place 1 drop into both eyes daily as needed (dry eyes).   triamterene-hydrochlorothiazide 37.5-25 MG capsule Commonly known as: DYAZIDE Take 1 each (1 capsule total) by mouth daily.   venlafaxine XR 75 MG 24 hr capsule Commonly known as: EFFEXOR-XR TAKE 3 CAPSULES (225 MG TOTAL) BY MOUTH DAILY WITH BREAKFAST.               Durable Medical Equipment  (From admission, onward)           Start     Ordered   01/22/21 1122  For home use only DME oxygen  Once       Question Answer Comment  Length of Need 6 Months   Mode or (Route) Nasal cannula   Liters per Minute 2   Frequency Continuous (stationary and portable oxygen unit needed)   Oxygen conserving device Yes   Oxygen delivery  system Gas      01/22/21 1121   01/21/21 1344  For home use only DME Nebulizer machine  Once       Question Answer Comment  Patient needs a nebulizer to treat with the following condition SOB (shortness of breath)   Length of Need 6 Months      01/21/21 1343            Follow-up Information     Jerrol Banana., MD. Schedule an appointment as soon as possible for a visit in 1 week(s).   Specialty: Family Medicine Why: Office will call patient to make follow up appt Contact information: [address] [place] [address] [phone]                Allergies  Allergen Reactions   Other Itching    Brand name bandaids with cloth backing causes redness and itching   Statins  Muscle weakness   Zetia [Ezetimibe]     Leg pain, upset stomach   Sulfa Antibiotics Rash    Consultations: None  Procedures/Studies: DG Chest 2 View  Result Date: 01/19/2021 CLINICAL DATA:  Short of breath. EXAM: CHEST - 2 VIEW COMPARISON:  09/05/2020. FINDINGS: Cardiac silhouette is normal in size. No mediastinal or hilar masses or evidence of adenopathy. Right anterior chest wall internal jugular Port-A-Cath is stable. Clear lungs.  No pleural effusion or pneumothorax. Skeletal structures are intact. IMPRESSION: No active cardiopulmonary disease. Electronically Signed   By: Lajean Manes M.D.   On: 01/19/2021 13:43   CT Angio Chest PE W and/or Wo Contrast  Addendum Date: 01/19/2021   ADDENDUM REPORT: 01/19/2021 19:19 ADDENDUM: Postsurgical changes are noted in the left breast similar in appearance to that seen on the prior exam consistent with the given clinical history. Electronically Signed   By: Inez Catalina M.D.   On: 01/19/2021 19:19   Result Date: 01/19/2021 CLINICAL DATA:  Shortness of breath for 1 week, initial encounter EXAM: CT ANGIOGRAPHY CHEST WITH CONTRAST TECHNIQUE: Multidetector CT imaging of the chest was performed using the standard protocol during  bolus administration of intravenous contrast. Multiplanar CT image reconstructions and MIPs were obtained to evaluate the vascular anatomy. CONTRAST:  30mL OMNIPAQUE IOHEXOL 350 MG/ML SOLN COMPARISON:  Chest x-ray from earlier in the same day. FINDINGS: Cardiovascular: Atherosclerotic calcifications of the thoracic aorta are noted. No aneurysmal dilatation or dissection is seen. No cardiac enlargement is noted. The pulmonary artery shows a normal branching pattern bilaterally. No filling defects are identified to suggest pulmonary embolism. Scattered coronary calcifications are noted. Mediastinum/Nodes: Thoracic inlet is within normal limits. Right-sided chest wall port is noted in satisfactory position. Scattered small hilar lymph nodes are seen new from the prior exam but not significant by size criteria. No mediastinal adenopathy is noted. The esophagus as visualized is within normal limits. Lungs/Pleura: Lungs are well aerated bilaterally. No focal infiltrate or sizable effusion is seen. Mild bronchial wall thickening is noted throughout both lungs likely representing a degree of bronchitis. No sizable parenchymal nodules are noted. Upper Abdomen: Fatty infiltration of the liver is noted. The remainder of the upper abdomen is within normal limits. Musculoskeletal: Degenerative changes of the thoracic spine are noted. No acute rib abnormality is seen. Review of the MIP images confirms the above findings. IMPRESSION: No evidence of pulmonary emboli. Mild bronchial wall thickening and scattered small likely reactive hilar nodes bilaterally. These changes are likely related to underlying bronchitis. Fatty liver. Aortic Atherosclerosis (ICD10-I70.0). Electronically Signed: By: Inez Catalina M.D. On: 01/19/2021 18:07    Subjective: Patient was seen and examined today.  No new complaints.  Waxing and waning oxygen requirement, and do becoming mildly hypoxic intermittently.  Discharge Exam: Vitals:   01/22/21 1117  01/22/21 1537  BP: 138/61 129/66  Pulse: 90 94  Resp: 20 20  Temp: 98.2 F (36.8 C) 97.6 F (36.4 C)  SpO2: 95% 94%   Vitals:   01/22/21 0003 01/22/21 0758 01/22/21 1117 01/22/21 1537  BP: 136/71 131/64 138/61 129/66  Pulse: 90 90 90 94  Resp: 20 20 20 20   Temp: 98 F (36.7 C) 98.1 F (36.7 C) 98.2 F (36.8 C) 97.6 F (36.4 C)  TempSrc: Oral   Oral  SpO2: 94% 95% 95% 94%  Weight:      Height:        General: Pt is alert, awake, not in acute distress Cardiovascular: RRR, S1/S2 +, no  rubs, no gallops Respiratory: Few dry crackles on left Abdominal: Soft, NT, ND, bowel sounds + Extremities: no edema, no cyanosis   The results of significant diagnostics from this hospitalization (including imaging, microbiology, ancillary and laboratory) are listed below for reference.    Microbiology: Recent Results (from the past 240 hour(s))  COVID-19, Flu A+B and RSV     Status: None   Collection Time: 01/17/21 12:00 AM   Specimen: Nasal Swab   Nasal Swab  Previously  Result Value Ref Range Status   SARS-CoV-2, NAA Not Detected Not Detected Final   Influenza A, NAA Not Detected Not Detected Final   Influenza B, NAA Not Detected Not Detected Final   RSV, NAA Not Detected Not Detected Final   Test Information: Comment  Final    Comment: This nucleic acid amplification test was developed and its performance characteristics determined by [employer]. Nucleic acid amplification tests include RT-PCR and TMA. This test has not been FDA cleared or approved. This test has been authorized by FDA under an Emergency Use Authorization (EUA). This test is only authorized for the duration of time the declaration that circumstances exist justifying the authorization of the emergency use of in vitro diagnostic tests for detection of SARS-CoV-2 virus and/or diagnosis of COVID-19 infection under section 564(b)(1) of the Act, 21 U.S.C. 401UUV-2(Z) (1), unless the authorization is  terminated or revoked sooner. When diagnostic testing is negative, the possibility of a false negative result should be considered in the context of a patient's recent exposures and the presence of clinical signs and symptoms consistent with COVID-19. An individual without symptoms of COVID-19 and who is not shedding SARS-CoV-2 virus wo uld expect to have a negative (not detected) result in this assay.   Resp Panel by RT-PCR (Flu A&B, Covid) Nasopharyngeal Swab     Status: None   Collection Time: 01/19/21  5:10 PM   Specimen: Nasopharyngeal Swab; Nasopharyngeal(NP) swabs in vial transport medium  Result Value Ref Range Status   SARS Coronavirus 2 by RT PCR NEGATIVE NEGATIVE Final    Comment: (NOTE) SARS-CoV-2 target nucleic acids are NOT DETECTED.  The SARS-CoV-2 RNA is generally detectable in upper respiratory specimens during the acute phase of infection. The lowest concentration of SARS-CoV-2 viral copies this assay can detect is 138 copies/mL. A negative result does not preclude SARS-Cov-2 infection and should not be used as the sole basis for treatment or other patient management decisions. A negative result may occur with  improper specimen collection/handling, submission of specimen other than nasopharyngeal swab, presence of viral mutation(s) within the areas targeted by this assay, and inadequate number of viral copies(<138 copies/mL). A negative result must be combined with clinical observations, patient history, and epidemiological information. The expected result is Negative.  Fact Sheet for Patients:  EntrepreneurPulse.com.au  Fact Sheet for Healthcare Providers:  IncredibleEmployment.be  This test is no t yet approved or cleared by the [place] FDA and  has been authorized for detection and/or diagnosis of SARS-CoV-2 by FDA under an Emergency Use Authorization (EUA). This EUA will remain  in effect (meaning this test can be  used) for the duration of the COVID-19 declaration under Section 564(b)(1) of the Act, 21 U.S.C.section 360bbb-3(b)(1), unless the authorization is terminated  or revoked sooner.       Influenza A by PCR NEGATIVE NEGATIVE Final   Influenza B by PCR NEGATIVE NEGATIVE Final    Comment: (NOTE) The Xpert Xpress SARS-CoV-2/FLU/RSV plus assay is intended as an aid in  the diagnosis of influenza from Nasopharyngeal swab specimens and should not be used as a sole basis for treatment. Nasal washings and aspirates are unacceptable for Xpert Xpress SARS-CoV-2/FLU/RSV testing.  Fact Sheet for Patients: EntrepreneurPulse.com.au  Fact Sheet for Healthcare Providers: IncredibleEmployment.be  This test is not yet approved or cleared by the [place] FDA and has been authorized for detection and/or diagnosis of SARS-CoV-2 by FDA under an Emergency Use Authorization (EUA). This EUA will remain in effect (meaning this test can be used) for the duration of the COVID-19 declaration under Section 564(b)(1) of the Act, 21 U.S.C. section 360bbb-3(b)(1), unless the authorization is terminated or revoked.  Performed at [facility], [place], [place] [address]   Respiratory (~20 pathogens) panel by PCR     Status: None   Collection Time: 01/20/21  8:19 AM   Specimen: Nasopharyngeal Swab; Respiratory  Result Value Ref Range Status   Adenovirus NOT DETECTED NOT DETECTED Final   Coronavirus 229E NOT DETECTED NOT DETECTED Final    Comment: (NOTE) The Coronavirus on the Respiratory Panel, DOES NOT test for the novel  Coronavirus (2019 nCoV)    Coronavirus HKU1 NOT DETECTED NOT DETECTED Final   Coronavirus NL63 NOT DETECTED NOT DETECTED Final   Coronavirus OC43 NOT DETECTED NOT DETECTED Final   Metapneumovirus NOT DETECTED NOT DETECTED Final   Rhinovirus / Enterovirus NOT DETECTED NOT DETECTED Final   Influenza A NOT DETECTED NOT DETECTED  Final   Influenza B NOT DETECTED NOT DETECTED Final   Parainfluenza Virus 1 NOT DETECTED NOT DETECTED Final   Parainfluenza Virus 2 NOT DETECTED NOT DETECTED Final   Parainfluenza Virus 3 NOT DETECTED NOT DETECTED Final   Parainfluenza Virus 4 NOT DETECTED NOT DETECTED Final   Respiratory Syncytial Virus NOT DETECTED NOT DETECTED Final   Bordetella pertussis NOT DETECTED NOT DETECTED Final   Bordetella Parapertussis NOT DETECTED NOT DETECTED Final   Chlamydophila pneumoniae NOT DETECTED NOT DETECTED Final   Mycoplasma pneumoniae NOT DETECTED NOT DETECTED Final    Comment: Performed at [facility], [place]. [address], [place], [place] [address]     Labs: BNP (last 3 results) No results for input(s): BNP in the last 8760 hours. Basic Metabolic Panel: Recent Labs  Lab 01/19/21 1314 01/19/21 1713 01/20/21 0423  NA 133*  --  135  K 3.4*  --  3.8  CL 97*  --  99  CO2 28  --  30  GLUCOSE 119*  --  168*  BUN 15  --  20  CREATININE 0.82  --  0.77  CALCIUM 9.3  --  9.4  MG  --  2.3  --   PHOS  --  3.5  --    Liver Function Tests: Recent Labs  Lab 01/19/21 1710  AST 16  ALT 20  ALKPHOS 126  BILITOT 0.5  PROT 7.3  ALBUMIN 3.6   No results for input(s): LIPASE, AMYLASE in the last 168 hours. No results for input(s): AMMONIA in the last 168 hours. CBC: Recent Labs  Lab 01/18/21 0928 01/19/21 1314 01/20/21 0423  WBC 11.7* 12.1* 10.0  NEUTROABS  --  9.2*  --   HGB 11.0* 10.8* 10.5*  HCT 34.1* 33.9* 32.3*  MCV 96.3 96.9 95.3  PLT 244 233 244   Cardiac Enzymes: No results for input(s): CKTOTAL, CKMB, CKMBINDEX, TROPONINI in the last 168 hours. BNP: Invalid input(s): POCBNP CBG: No results for input(s): GLUCAP in the last 168 hours. D-Dimer  No results for input(s): DDIMER in the last 72 hours. Hgb A1c No results for input(s): HGBA1C in the last 72 hours. Lipid Profile No results for input(s): CHOL, HDL, LDLCALC, TRIG, CHOLHDL, LDLDIRECT in the last 72  hours. Thyroid function studies Recent Labs    01/19/21 1713  TSH 0.538   Anemia work up No results for input(s): VITAMINB12, FOLATE, FERRITIN, TIBC, IRON, RETICCTPCT in the last 72 hours. Urinalysis    Component Value Date/Time   COLORURINE YELLOW 01/21/2019 1629   APPEARANCEUR HAZY (A) 01/21/2019 1629   LABSPEC 1.015 01/21/2019 1629   PHURINE 7.0 01/21/2019 1629   GLUCOSEU NEGATIVE 01/21/2019 1629   HGBUR NEGATIVE 01/21/2019 1629   BILIRUBINUR NEGATIVE 01/21/2019 1629   BILIRUBINUR negative 10/27/2018 1012   KETONESUR NEGATIVE 01/21/2019 1629   PROTEINUR NEGATIVE 01/21/2019 1629   UROBILINOGEN 0.2 10/27/2018 1012   NITRITE NEGATIVE 01/21/2019 1629   LEUKOCYTESUR NEGATIVE 01/21/2019 1629   Sepsis Labs Invalid input(s): PROCALCITONIN,  WBC,  LACTICIDVEN Microbiology Recent Results (from the past 240 hour(s))  COVID-19, Flu A+B and RSV     Status: None   Collection Time: 01/17/21 12:00 AM   Specimen: Nasal Swab   Nasal Swab  Previously  Result Value Ref Range Status   SARS-CoV-2, NAA Not Detected Not Detected Final   Influenza A, NAA Not Detected Not Detected Final   Influenza B, NAA Not Detected Not Detected Final   RSV, NAA Not Detected Not Detected Final   Test Information: Comment  Final    Comment: This nucleic acid amplification test was developed and its performance characteristics determined by [employer], [employer]. Nucleic acid amplification tests include RT-PCR and TMA. This test has not been FDA cleared or approved. This test has been authorized by FDA under an Emergency Use Authorization (EUA). This test is only authorized for the duration of time the declaration that circumstances exist justifying the authorization of the emergency use of in vitro diagnostic tests for detection of SARS-CoV-2 virus and/or diagnosis of COVID-19 infection under section 564(b)(1) of the Act, 21 U.S.C. 629BMW-4(X) (1), unless the authorization is terminated or  revoked sooner. When diagnostic testing is negative, the possibility of a false negative result should be considered in the context of a patient's recent exposures and the presence of clinical signs and symptoms consistent with COVID-19. An individual without symptoms of COVID-19 and who is not shedding SARS-CoV-2 virus wo uld expect to have a negative (not detected) result in this assay.   Resp Panel by RT-PCR (Flu A&B, Covid) Nasopharyngeal Swab     Status: None   Collection Time: 01/19/21  5:10 PM   Specimen: Nasopharyngeal Swab; Nasopharyngeal(NP) swabs in vial transport medium  Result Value Ref Range Status   SARS Coronavirus 2 by RT PCR NEGATIVE NEGATIVE Final    Comment: (NOTE) SARS-CoV-2 target nucleic acids are NOT DETECTED.  The SARS-CoV-2 RNA is generally detectable in upper respiratory specimens during the acute phase of infection. The lowest concentration of SARS-CoV-2 viral copies this assay can detect is 138 copies/mL. A negative result does not preclude SARS-Cov-2 infection and should not be used as the sole basis for treatment or other patient management decisions. A negative result may occur with  improper specimen collection/handling, submission of specimen other than nasopharyngeal swab, presence of viral mutation(s) within the areas targeted by this assay, and inadequate number of viral copies(<138 copies/mL). A negative result must be combined with clinical observations, patient history, and epidemiological information. The expected result is Negative.  Fact  Sheet for Patients:  EntrepreneurPulse.com.au  Fact Sheet for Healthcare Providers:  IncredibleEmployment.be  This test is no t yet approved or cleared by the [place] FDA and  has been authorized for detection and/or diagnosis of SARS-CoV-2 by FDA under an Emergency Use Authorization (EUA). This EUA will remain  in effect (meaning this test can be used) for the  duration of the COVID-19 declaration under Section 564(b)(1) of the Act, 21 U.S.C.section 360bbb-3(b)(1), unless the authorization is terminated  or revoked sooner.       Influenza A by PCR NEGATIVE NEGATIVE Final   Influenza B by PCR NEGATIVE NEGATIVE Final    Comment: (NOTE) The Xpert Xpress SARS-CoV-2/FLU/RSV plus assay is intended as an aid in the diagnosis of influenza from Nasopharyngeal swab specimens and should not be used as a sole basis for treatment. Nasal washings and aspirates are unacceptable for Xpert Xpress SARS-CoV-2/FLU/RSV testing.  Fact Sheet for Patients: EntrepreneurPulse.com.au  Fact Sheet for Healthcare Providers: IncredibleEmployment.be  This test is not yet approved or cleared by the [place] FDA and has been authorized for detection and/or diagnosis of SARS-CoV-2 by FDA under an Emergency Use Authorization (EUA). This EUA will remain in effect (meaning this test can be used) for the duration of the COVID-19 declaration under Section 564(b)(1) of the Act, 21 U.S.C. section 360bbb-3(b)(1), unless the authorization is terminated or revoked.  Performed at [facility], [place], [place] [address]   Respiratory (~20 pathogens) panel by PCR     Status: None   Collection Time: 01/20/21  8:19 AM   Specimen: Nasopharyngeal Swab; Respiratory  Result Value Ref Range Status   Adenovirus NOT DETECTED NOT DETECTED Final   Coronavirus 229E NOT DETECTED NOT DETECTED Final    Comment: (NOTE) The Coronavirus on the Respiratory Panel, DOES NOT test for the novel  Coronavirus (2019 nCoV)    Coronavirus HKU1 NOT DETECTED NOT DETECTED Final   Coronavirus NL63 NOT DETECTED NOT DETECTED Final   Coronavirus OC43 NOT DETECTED NOT DETECTED Final   Metapneumovirus NOT DETECTED NOT DETECTED Final   Rhinovirus / Enterovirus NOT DETECTED NOT DETECTED Final   Influenza A NOT DETECTED NOT DETECTED Final    Influenza B NOT DETECTED NOT DETECTED Final   Parainfluenza Virus 1 NOT DETECTED NOT DETECTED Final   Parainfluenza Virus 2 NOT DETECTED NOT DETECTED Final   Parainfluenza Virus 3 NOT DETECTED NOT DETECTED Final   Parainfluenza Virus 4 NOT DETECTED NOT DETECTED Final   Respiratory Syncytial Virus NOT DETECTED NOT DETECTED Final   Bordetella pertussis NOT DETECTED NOT DETECTED Final   Bordetella Parapertussis NOT DETECTED NOT DETECTED Final   Chlamydophila pneumoniae NOT DETECTED NOT DETECTED Final   Mycoplasma pneumoniae NOT DETECTED NOT DETECTED Final    Comment: Performed at [facility], [place]. [address], [place], [place] [address]    Time coordinating discharge: Over 30 minutes  SIGNED:  Lorella Nimrod, MD  [facility] 01/22/2021, 4:38 PM  If 7PM-7AM, please contact night-coverage [email or web address]  This record has been created using Systems analyst. Errors have been sought and corrected,but may not always be located. Such creation errors do not reflect on the standard of care.

## 2021-01-21 NOTE — Progress Notes (Signed)
PROGRESS NOTE    Victoria Dean  KGY:[id number] DOB: Oct 22, 1950 DOA: 01/19/2021 PCP: Jerrol Banana., MD   Brief Narrative: Taken from H&P. Victoria Dean is a 70 y.o. female with medical history significant for History of left upper outer quadrant breast cancer, ER positive (chemotherapy completed) now and radiation 5 days per week, via right anterior chest wall IJ Port-A-Cath, anxiety, hypertension, osteoarthritis, depression, who presents emergency department for chief concerns of shortness of breath. On arrival she was hemodynamically stable except mild hypoxia at 86% with ambulation and she was placed on 2 L of oxygen with resultant improvement. COVID-19 PCR and influenza PCR was negative. CTA was negative for PE but there was some mild bronchial wall thickening and scattered small likely reactive hilar lymphadenopathy, most likely underlying bronchitis. She was started on steroid and breathing treatment. Respiratory viral panel negative.  She was able to wean off from oxygen.  Plan was to discharge but patient would like to spend another night stating that she still feels short of breath  Subjective: Patient was seen and examined today.  No new complaints.  She was off from the oxygen.  Daughter at bedside.  Assessment & Plan:   Principal Problem:   Acute on chronic respiratory failure with hypoxia (HCC) Active Problems:   Clinical depression   Dermatitis, eczematoid   Essential (primary) hypertension   Anxiety, generalized   Arthritis, degenerative   Malignant neoplasm of upper-outer quadrant of left breast in female, estrogen receptor positive ([place])   Upper respiratory infection with cough and congestion  Acute hypoxic respiratory failure.  Most likely secondary to bronchitis.  Influenza and COVID PCR negative.  No prior diagnosis of COPD but patient is getting left sided radiation for her breast cancer. Continue to desaturate with ambulation. -Continue with every 6  hourly DuoNeb. -Continue with Solu-Medrol  -Continue Zithromax -Continue with supportive care -Continue with supplemental oxygen-wean as tolerated  History of generalized anxiety.  -Continue home dose of p.o. Valium as needed  Depression. -Continue home dose of venlafaxine.  Hypertension.  Blood pressure within goal. -Continue home dose of losartan -Continue holding home dose of HCTZ and triamterene-we can resume if needed  History of breast cancer. -Continue with outpatient oncology  Objective: Vitals:   01/21/21 0359 01/21/21 0757 01/21/21 1150 01/21/21 1521  BP: 122/65 111/67 140/71 132/65  Pulse: 94 89 90 98  Resp: 18 18 15 18   Temp: 98 F (36.7 C) 97.7 F (36.5 C) 98.7 F (37.1 C) 98.4 F (36.9 C)  TempSrc: Oral Oral Oral Oral  SpO2: 94% 94% 93% 97%  Weight:      Height:       No intake or output data in the 24 hours ending 01/21/21 1709 Filed Weights   01/19/21 1252  Weight: 106.6 kg    Examination:  General.  Well-developed lady, in no acute distress. Pulmonary.  Lungs clear bilaterally, normal respiratory effort. CV.  Regular rate and rhythm, no JVD, rub or murmur. Abdomen.  Soft, nontender, nondistended, BS positive. CNS.  Alert and oriented x3.  No focal neurologic deficit. Extremities.  No edema, no cyanosis, pulses intact and symmetrical. Psychiatry.  Judgment and insight appears normal.    DVT prophylaxis: Lovenox Code Status: DNR Family Communication: Discussed with daughter at bedside Disposition Plan:  Status is: Inpatient  Remains inpatient appropriate because: Severity of illness   Level of care: Telemetry Medical  All the records are reviewed and case discussed with Care Management/Social Worker. Management plans  discussed with the patient, nursing and they are in agreement.  Consultants:  None  Procedures:  Antimicrobials:  Zithromax  Data Reviewed: I have personally reviewed following labs and imaging  studies  CBC: Recent Labs  Lab 01/18/21 0928 01/19/21 1314 01/20/21 0423  WBC 11.7* 12.1* 10.0  NEUTROABS  --  9.2*  --   HGB 11.0* 10.8* 10.5*  HCT 34.1* 33.9* 32.3*  MCV 96.3 96.9 95.3  PLT 244 233 101    Basic Metabolic Panel: Recent Labs  Lab 01/19/21 1314 01/19/21 1713 01/20/21 0423  NA 133*  --  135  K 3.4*  --  3.8  CL 97*  --  99  CO2 28  --  30  GLUCOSE 119*  --  168*  BUN 15  --  20  CREATININE 0.82  --  0.77  CALCIUM 9.3  --  9.4  MG  --  2.3  --   PHOS  --  3.5  --     GFR: Estimated Creatinine Clearance: 74.4 mL/min (by C-G formula based on SCr of 0.77 mg/dL). Liver Function Tests: Recent Labs  Lab 01/19/21 1710  AST 16  ALT 20  ALKPHOS 126  BILITOT 0.5  PROT 7.3  ALBUMIN 3.6    No results for input(s): LIPASE, AMYLASE in the last 168 hours. No results for input(s): AMMONIA in the last 168 hours. Coagulation Profile: No results for input(s): INR, PROTIME in the last 168 hours. Cardiac Enzymes: No results for input(s): CKTOTAL, CKMB, CKMBINDEX, TROPONINI in the last 168 hours. BNP (last 3 results) No results for input(s): PROBNP in the last 8760 hours. HbA1C: No results for input(s): HGBA1C in the last 72 hours. CBG: No results for input(s): GLUCAP in the last 168 hours. Lipid Profile: No results for input(s): CHOL, HDL, LDLCALC, TRIG, CHOLHDL, LDLDIRECT in the last 72 hours. Thyroid Function Tests: Recent Labs    01/19/21 1713  TSH 0.538    Anemia Panel: No results for input(s): VITAMINB12, FOLATE, FERRITIN, TIBC, IRON, RETICCTPCT in the last 72 hours. Sepsis Labs: No results for input(s): PROCALCITON, LATICACIDVEN in the last 168 hours.  Recent Results (from the past 240 hour(s))  COVID-19, Flu A+B and RSV     Status: None   Collection Time: 01/17/21 12:00 AM   Specimen: Nasal Swab   Nasal Swab  Previously  Result Value Ref Range Status   SARS-CoV-2, NAA Not Detected Not Detected Final   Influenza A, NAA Not Detected Not  Detected Final   Influenza B, NAA Not Detected Not Detected Final   RSV, NAA Not Detected Not Detected Final   Test Information: Comment  Final    Comment: This nucleic acid amplification test was developed and its performance characteristics determined by [employer]. Nucleic acid amplification tests include RT-PCR and TMA. This test has not been FDA cleared or approved. This test has been authorized by FDA under an Emergency Use Authorization (EUA). This test is only authorized for the duration of time the declaration that circumstances exist justifying the authorization of the emergency use of in vitro diagnostic tests for detection of SARS-CoV-2 virus and/or diagnosis of COVID-19 infection under section 564(b)(1) of the Act, 21 U.S.C. 751WCH-8(N) (1), unless the authorization is terminated or revoked sooner. When diagnostic testing is negative, the possibility of a false negative result should be considered in the context of a patient's recent exposures and the presence of clinical signs and symptoms consistent with COVID-19. An individual without symptoms of COVID-19 and  who is not shedding SARS-CoV-2 virus wo uld expect to have a negative (not detected) result in this assay.   Resp Panel by RT-PCR (Flu A&B, Covid) Nasopharyngeal Swab     Status: None   Collection Time: 01/19/21  5:10 PM   Specimen: Nasopharyngeal Swab; Nasopharyngeal(NP) swabs in vial transport medium  Result Value Ref Range Status   SARS Coronavirus 2 by RT PCR NEGATIVE NEGATIVE Final    Comment: (NOTE) SARS-CoV-2 target nucleic acids are NOT DETECTED.  The SARS-CoV-2 RNA is generally detectable in upper respiratory specimens during the acute phase of infection. The lowest concentration of SARS-CoV-2 viral copies this assay can detect is 138 copies/mL. A negative result does not preclude SARS-Cov-2 infection and should not be used as the sole basis for treatment or other patient management  decisions. A negative result may occur with  improper specimen collection/handling, submission of specimen other than nasopharyngeal swab, presence of viral mutation(s) within the areas targeted by this assay, and inadequate number of viral copies(<138 copies/mL). A negative result must be combined with clinical observations, patient history, and epidemiological information. The expected result is Negative.  Fact Sheet for Patients:  EntrepreneurPulse.com.au  Fact Sheet for Healthcare Providers:  IncredibleEmployment.be  This test is no t yet approved or cleared by the [place] FDA and  has been authorized for detection and/or diagnosis of SARS-CoV-2 by FDA under an Emergency Use Authorization (EUA). This EUA will remain  in effect (meaning this test can be used) for the duration of the COVID-19 declaration under Section 564(b)(1) of the Act, 21 U.S.C.section 360bbb-3(b)(1), unless the authorization is terminated  or revoked sooner.       Influenza A by PCR NEGATIVE NEGATIVE Final   Influenza B by PCR NEGATIVE NEGATIVE Final    Comment: (NOTE) The Xpert Xpress SARS-CoV-2/FLU/RSV plus assay is intended as an aid in the diagnosis of influenza from Nasopharyngeal swab specimens and should not be used as a sole basis for treatment. Nasal washings and aspirates are unacceptable for Xpert Xpress SARS-CoV-2/FLU/RSV testing.  Fact Sheet for Patients: EntrepreneurPulse.com.au  Fact Sheet for Healthcare Providers: IncredibleEmployment.be  This test is not yet approved or cleared by the [place] FDA and has been authorized for detection and/or diagnosis of SARS-CoV-2 by FDA under an Emergency Use Authorization (EUA). This EUA will remain in effect (meaning this test can be used) for the duration of the COVID-19 declaration under Section 564(b)(1) of the Act, 21 U.S.C. section 360bbb-3(b)(1), unless the  authorization is terminated or revoked.  Performed at [facility], [place], [place] [address]   Respiratory (~20 pathogens) panel by PCR     Status: None   Collection Time: 01/20/21  8:19 AM   Specimen: Nasopharyngeal Swab; Respiratory  Result Value Ref Range Status   Adenovirus NOT DETECTED NOT DETECTED Final   Coronavirus 229E NOT DETECTED NOT DETECTED Final    Comment: (NOTE) The Coronavirus on the Respiratory Panel, DOES NOT test for the novel  Coronavirus (2019 nCoV)    Coronavirus HKU1 NOT DETECTED NOT DETECTED Final   Coronavirus NL63 NOT DETECTED NOT DETECTED Final   Coronavirus OC43 NOT DETECTED NOT DETECTED Final   Metapneumovirus NOT DETECTED NOT DETECTED Final   Rhinovirus / Enterovirus NOT DETECTED NOT DETECTED Final   Influenza A NOT DETECTED NOT DETECTED Final   Influenza B NOT DETECTED NOT DETECTED Final   Parainfluenza Virus 1 NOT DETECTED NOT DETECTED Final   Parainfluenza Virus 2 NOT DETECTED NOT DETECTED  Final   Parainfluenza Virus 3 NOT DETECTED NOT DETECTED Final   Parainfluenza Virus 4 NOT DETECTED NOT DETECTED Final   Respiratory Syncytial Virus NOT DETECTED NOT DETECTED Final   Bordetella pertussis NOT DETECTED NOT DETECTED Final   Bordetella Parapertussis NOT DETECTED NOT DETECTED Final   Chlamydophila pneumoniae NOT DETECTED NOT DETECTED Final   Mycoplasma pneumoniae NOT DETECTED NOT DETECTED Final    Comment: Performed at [facility], [place] [address] [place], [place] [address]      Radiology Studies: CT Angio Chest PE W and/or Wo Contrast  Addendum Date: 01/19/2021   ADDENDUM REPORT: 01/19/2021 19:19 ADDENDUM: Postsurgical changes are noted in the left breast similar in appearance to that seen on the prior exam consistent with the given clinical history. Electronically Signed   By: Inez Catalina M.D.   On: 01/19/2021 19:19   Result Date: 01/19/2021 CLINICAL DATA:  Shortness of breath for 1 week, initial encounter  EXAM: CT ANGIOGRAPHY CHEST WITH CONTRAST TECHNIQUE: Multidetector CT imaging of the chest was performed using the standard protocol during bolus administration of intravenous contrast. Multiplanar CT image reconstructions and MIPs were obtained to evaluate the vascular anatomy. CONTRAST:  103mL OMNIPAQUE IOHEXOL 350 MG/ML SOLN COMPARISON:  Chest x-ray from earlier in the same day. FINDINGS: Cardiovascular: Atherosclerotic calcifications of the thoracic aorta are noted. No aneurysmal dilatation or dissection is seen. No cardiac enlargement is noted. The pulmonary artery shows a normal branching pattern bilaterally. No filling defects are identified to suggest pulmonary embolism. Scattered coronary calcifications are noted. Mediastinum/Nodes: Thoracic inlet is within normal limits. Right-sided chest wall port is noted in satisfactory position. Scattered small hilar lymph nodes are seen new from the prior exam but not significant by size criteria. No mediastinal adenopathy is noted. The esophagus as visualized is within normal limits. Lungs/Pleura: Lungs are well aerated bilaterally. No focal infiltrate or sizable effusion is seen. Mild bronchial wall thickening is noted throughout both lungs likely representing a degree of bronchitis. No sizable parenchymal nodules are noted. Upper Abdomen: Fatty infiltration of the liver is noted. The remainder of the upper abdomen is within normal limits. Musculoskeletal: Degenerative changes of the thoracic spine are noted. No acute rib abnormality is seen. Review of the MIP images confirms the above findings. IMPRESSION: No evidence of pulmonary emboli. Mild bronchial wall thickening and scattered small likely reactive hilar nodes bilaterally. These changes are likely related to underlying bronchitis. Fatty liver. Aortic Atherosclerosis (ICD10-I70.0). Electronically Signed: By: Inez Catalina M.D. On: 01/19/2021 18:07    Scheduled Meds:  azithromycin  250 mg Oral Daily    Chlorhexidine Gluconate Cloth  6 each Topical Daily   chlorpheniramine-HYDROcodone  5 mL Oral QHS   enoxaparin (LOVENOX) injection  0.5 mg/kg Subcutaneous Q24H   losartan  100 mg Oral Daily   venlafaxine XR  225 mg Oral Q breakfast   Continuous Infusions:   LOS: 1 day   Time spent: 35 minutes. More than 50% of the time was spent in counseling/coordination of care  Lorella Nimrod, MD [facility]  If 7PM-7AM, please contact night-coverage [email or web address]  01/21/2021, 5:09 PM   This record has been created using Systems analyst. Errors have been sought and corrected,but may not always be located. Such creation errors do not reflect on the standard of care.

## 2021-01-21 NOTE — Telephone Encounter (Signed)
Please review.  Can we use one of your same day appointment?  Thanks,   -Mickel Baas

## 2021-01-22 ENCOUNTER — Ambulatory Visit: Payer: Medicare PPO

## 2021-01-22 ENCOUNTER — Telehealth: Payer: Self-pay

## 2021-01-22 MED ORDER — IPRATROPIUM-ALBUTEROL 0.5-2.5 (3) MG/3ML IN SOLN: 3.0000 mL | RESPIRATORY_TRACT | 0 refills | Status: DC | PRN

## 2021-01-22 NOTE — Discharge Instructions (Signed)
You are being discharged on some home oxygen. Please follow-up with your pulmonologist closely for further recommendations

## 2021-01-22 NOTE — TOC Progression Note (Addendum)
Transition of Care [facility]) - Progression Note    Patient Details  Name: Victoria Dean MRN: [id number] Date of Birth: October 04, 1950  [facility]) CM/SW [place], RN Phone Number: 01/22/2021, 2:23 PM  Clinical Narrative:   Patient qualifies for home oxygen based on SATs documented by care team. Patient and family accept oxygen and [employer] notified for oxygen to be delivered to patient's room prior to discharge.  Patient and family decline further toc needs at this time.   Addendum:  patient also ordered home nebulizer, [employer] also notified of this, will deliver to room      Expected Discharge Plan and Services           Expected Discharge Date: 01/21/21                                     Social Determinants of Health (SDOH) Interventions    Readmission Risk Interventions No flowsheet data found.

## 2021-01-22 NOTE — Telephone Encounter (Signed)
I believe Mickel Baas was getting something scheduled for her next week.    Copied from [place] [phone]. Topic: Appointment Scheduling - Scheduling Inquiry for Clinic >> [name] 22, 2021 12:31 PM Wynetta Emery, [place] C wrote: Reason for CRM: hospital called in to schedule pt a hospital follow up apt. Pt will be getting discharged today and need to follow up 7-10 days.   Please assist pt further with scheduling.

## 2021-01-22 NOTE — Progress Notes (Signed)
SATURATION QUALIFICATIONS: (This note is used to comply with regulatory documentation for home oxygen)  Patient Saturations on Room Air at Rest = 92%  Patient Saturations on Room Air while Ambulating = 86%  Patient Saturations on 2 Liters of oxygen while Ambulating = 97%  Patient ambulated 160 feet on RA with rest breaks and maintained O2 sats 86% with c/o SOB, increased WOB and weakness.  Patient ambulated 160 feet on 2L O2 via [place] and maintained O2 sats 97% with no c/o SOB or weakness.

## 2021-01-22 NOTE — Telephone Encounter (Signed)
Apt 01/30/2021 at 10:40am  Thanks,   -Mickel Baas

## 2021-01-23 ENCOUNTER — Ambulatory Visit: Payer: Medicare PPO

## 2021-01-23 ENCOUNTER — Telehealth: Payer: Self-pay

## 2021-01-23 NOTE — Telephone Encounter (Signed)
LVM to return call.

## 2021-01-24 ENCOUNTER — Ambulatory Visit: Payer: Medicare PPO

## 2021-01-24 ENCOUNTER — Telehealth: Payer: Self-pay

## 2021-01-24 NOTE — Telephone Encounter (Signed)
Transition Care Management Follow-up Telephone Call Date of discharge and from where: [facility] 01/22/21 How have you been since you were released from the hospital? Weak, still SOB Any questions or concerns? No  Items Reviewed: Did the pt receive and understand the discharge instructions provided? Yes  Medications obtained and verified? Yes  Other? Yes  Any new allergies since your discharge? No  Dietary orders reviewed? No Do you have support at home? Yes   Home Care and Equipment/Supplies: Were home health services ordered? no  Functional Questionnaire: (I = Independent and D = Dependent) ADLs: I  Bathing/Dressing- I  Meal Prep- I  Eating- I  Maintaining continence- I  Transferring/Ambulation- I  Managing Meds- I  Follow up appointments reviewed:  PCP Hospital f/u appt confirmed? Yes  Scheduled to see Dr.Fisher on 12/28 @ 10:40. [facility] f/u appt confirmed? No   Are transportation arrangements needed? No  If their condition worsens, is the pt aware to call PCP or go to the Emergency Dept.? Yes Was the patient provided with contact information for the PCP's office or ED? Yes Was to pt encouraged to call back with questions or concerns? Yes

## 2021-01-25 ENCOUNTER — Ambulatory Visit: Payer: Medicare PPO

## 2021-01-29 ENCOUNTER — Ambulatory Visit: Payer: Medicare PPO

## 2021-01-29 ENCOUNTER — Ambulatory Visit
Admission: RE | Admit: 2021-01-29 | Discharge: 2021-01-29 | Disposition: A | Discharge status: Home / Self Care | Payer: Medicare PPO | Source: Ambulatory Visit | Attending: Radiation Oncology | Admitting: Radiation Oncology

## 2021-01-29 DIAGNOSIS — Z170 Estrogen receptor positive status [ER+]: Secondary | ICD-10-CM | POA: Diagnosis not present

## 2021-01-29 DIAGNOSIS — C773 Secondary and unspecified malignant neoplasm of axilla and upper limb lymph nodes: Secondary | ICD-10-CM | POA: Diagnosis not present

## 2021-01-29 DIAGNOSIS — Z510 Encounter for antineoplastic radiation therapy: Secondary | ICD-10-CM | POA: Diagnosis not present

## 2021-01-29 DIAGNOSIS — C50412 Malignant neoplasm of upper-outer quadrant of left female breast: Secondary | ICD-10-CM | POA: Diagnosis not present

## 2021-01-30 ENCOUNTER — Telehealth: Payer: Self-pay | Admitting: Family Medicine

## 2021-01-30 ENCOUNTER — Ambulatory Visit: Payer: Medicare PPO

## 2021-01-30 ENCOUNTER — Ambulatory Visit: Payer: Medicare PPO | Admitting: Family Medicine

## 2021-01-30 ENCOUNTER — Encounter: Payer: Self-pay | Admitting: Family Medicine

## 2021-01-30 ENCOUNTER — Other Ambulatory Visit: Payer: Self-pay

## 2021-01-30 ENCOUNTER — Ambulatory Visit
Admission: RE | Admit: 2021-01-30 | Discharge: 2021-01-30 | Disposition: A | Discharge status: Home / Self Care | Payer: Medicare PPO | Source: Ambulatory Visit | Attending: Radiation Oncology | Admitting: Radiation Oncology

## 2021-01-30 VITALS — BP 120/64 | HR 54 | Ht 63.0 in | Wt 235.0 lb

## 2021-01-30 DIAGNOSIS — R0609 Other forms of dyspnea: Secondary | ICD-10-CM

## 2021-01-30 DIAGNOSIS — C50412 Malignant neoplasm of upper-outer quadrant of left female breast: Secondary | ICD-10-CM

## 2021-01-30 DIAGNOSIS — C773 Secondary and unspecified malignant neoplasm of axilla and upper limb lymph nodes: Secondary | ICD-10-CM | POA: Diagnosis not present

## 2021-01-30 DIAGNOSIS — R0902 Hypoxemia: Secondary | ICD-10-CM | POA: Diagnosis not present

## 2021-01-30 DIAGNOSIS — Z510 Encounter for antineoplastic radiation therapy: Secondary | ICD-10-CM | POA: Diagnosis not present

## 2021-01-30 DIAGNOSIS — Z170 Estrogen receptor positive status [ER+]: Secondary | ICD-10-CM | POA: Diagnosis not present

## 2021-01-30 MED ORDER — COMBIVENT RESPIMAT 20-100 MCG/ACT IN AERS: 1.0000 | INHALATION_SPRAY | Freq: Four times a day (QID) | RESPIRATORY_TRACT | 0 refills | Status: DC | PRN

## 2021-01-30 NOTE — Telephone Encounter (Signed)
Paient called in about inhaler that was supposed to be pescribed a week ago. She isnt sure what the name of it is, says something that starts with b,. She is also says her weight is recored wrong, should be 235 not 253.

## 2021-01-30 NOTE — Telephone Encounter (Signed)
I think she is talking about the Duoneb. I went ahead and sent prescription for combivent, which is the same think but in an inhaler form rather than a nebulizer.

## 2021-01-30 NOTE — Addendum Note (Signed)
Addended by: Birdie Sons on: 01/30/2021 03:19 PM   Modules accepted: Orders

## 2021-01-30 NOTE — Telephone Encounter (Signed)
Weight has been corrected in today's office note. Please advise on inhaler request.

## 2021-01-30 NOTE — Progress Notes (Signed)
Established patient visit   Patient: Victoria Dean   DOB: [birth year]   70 y.o. Female  MRN: [id number] Visit Date: 01/30/2021  Today's healthcare provider: Lelon Huh, MD   Chief Complaint  Patient presents with   Hospitalization Follow-up   Subjective    HPI  Follow up Hospitalization  Patient was admitted to [facility] on 01/19/21 and discharged on 01/22/21. She was treated for shortness of breath. Had negative chest XR.  CTA with findings thought to be due to underlying bronchitis. Respiratory virus panel, flu, covid, and RSV tests were all negative.  CBC, Troponin, and bmet were normal aside from her baseline chronic anemia.   She was hypoxic on arrival requiring supplemental oxygen until the time of discharge.  She was treated with IV steroids and oral azithromycin. She was discharged on oral prednisone for 5 days and azithromycin.   Telephone follow up was done on 01/24/21 She reports fair compliance with treatment. She reports this condition is  improving but not 100% better . She reports pharmacy says they have no record of receiving an order for Duoneb and patient is still having shortness of breath when walking.  ----------------------------------------------------------------------------------------- -   Medications: Outpatient Medications Prior to Visit  Medication Sig   acetaminophen (TYLENOL) 650 MG CR tablet Take 1,300 mg by mouth every 8 (eight) hours as needed for pain.   Biotin 1000 MCG tablet Take 1,000 mcg by mouth daily.   cholecalciferol (VITAMIN D) 25 MCG (1000 UNIT) tablet Take 1,000 Units by mouth daily.   diazepam (VALIUM) 5 MG tablet Take 1 tablet (5 mg total) by mouth every 12 (twelve) hours as needed for anxiety.   guaiFENesin-dextromethorphan (ROBITUSSIN DM) 100-10 MG/5ML syrup Take 5 mLs by mouth every 4 (four) hours as needed for cough.   hydrocortisone 2.5 % lotion Apply 1 application topically daily as needed (when washing face).    ipratropium-albuterol (DUONEB) 0.5-2.5 (3) MG/3ML SOLN Take 3 mLs by nebulization every 4 (four) hours as needed (For shorness of breath).   lidocaine-prilocaine (EMLA) cream Apply to affected area once   losartan (COZAAR) 100 MG tablet TAKE 1 TABLET (100 MG TOTAL) BY MOUTH DAILY.   Menthol, Topical Analgesic, (BIOFREEZE EX) Apply 1 application topically daily as needed (back pain).   mometasone (ELOCON) 0.1 % cream Apply 1 application topically daily as needed (ear irritation).   ondansetron (ZOFRAN) 8 MG tablet Take 1 tablet (8 mg total) by mouth 2 (two) times daily as needed (Nausea or vomiting).   Polyethyl Glycol-Propyl Glycol (SYSTANE OP) Place 1 drop into both eyes daily as needed (dry eyes).   potassium chloride SA (KLOR-CON) 20 MEQ tablet Take 1 tablet (20 mEq total) by mouth daily.   prochlorperazine (COMPAZINE) 10 MG tablet Take 1 tablet (10 mg total) by mouth every 6 (six) hours as needed (Nausea or vomiting).   rosuvastatin (CRESTOR) 5 MG tablet TAKE 1 TABLET (5 MG TOTAL) BY MOUTH DAILY.   triamterene-hydrochlorothiazide (DYAZIDE) 37.5-25 MG capsule Take 1 each (1 capsule total) by mouth daily.   venlafaxine XR (EFFEXOR-XR) 75 MG 24 hr capsule TAKE 3 CAPSULES (225 MG TOTAL) BY MOUTH DAILY WITH BREAKFAST.   No facility-administered medications prior to visit.     Last CBC Lab Results  Component Value Date   WBC 10.0 01/20/2021   HGB 10.5 (L) 01/20/2021   HCT 32.3 (L) 01/20/2021   MCV 95.3 01/20/2021   MCH 31.0 01/20/2021   RDW 13.7 01/20/2021   PLT  244 54/00/8676   Last metabolic panel Lab Results  Component Value Date   GLUCOSE 168 (H) 01/20/2021   NA 135 01/20/2021   K 3.8 01/20/2021   CL 99 01/20/2021   CO2 30 01/20/2021   BUN 20 01/20/2021   CREATININE 0.77 01/20/2021   GFRNONAA >60 01/20/2021   CALCIUM 9.4 01/20/2021   PHOS 3.5 01/19/2021   PROT 7.3 01/19/2021   ALBUMIN 3.6 01/19/2021   LABGLOB 2.3 02/23/2020   AGRATIO 2.0 02/23/2020   BILITOT 0.5  01/19/2021   ALKPHOS 126 01/19/2021   AST 16 01/19/2021   ALT 20 01/19/2021   ANIONGAP 6 01/20/2021   Last lipids Lab Results  Component Value Date   CHOL 151 02/23/2020   HDL 53 02/23/2020   LDLCALC 77 02/23/2020   TRIG 119 02/23/2020   CHOLHDL 2.8 02/23/2020   Last hemoglobin A1c No results found for: HGBA1C Last thyroid functions Lab Results  Component Value Date   TSH 0.538 01/19/2021   Last vitamin D No results found for: 25OHVITD2, 25OHVITD3, VD25OH Last vitamin B12 and Folate Lab Results  Component Value Date   VITAMINB12 148 (L) 10/01/2020       Objective    BP 120/64 (BP Location: Right Arm, Patient Position: Sitting, Cuff Size: Large)    Pulse (!) 54    Ht 5\' 3"  (1.6 m)    Wt 253 lb 11.2 oz (115.1 kg)    SpO2 92% Comment: room air; walking   BMI 44.94 kg/m  BP Readings from Last 3 Encounters:  01/30/21 120/64  01/22/21 129/66  01/17/21 116/73   Wt Readings from Last 3 Encounters:  01/30/21 253 lb 11.2 oz (115.1 kg)  01/19/21 235 lb (106.6 kg)  01/17/21 235 lb (106.6 kg)      Physical Exam   General: Appearance:    Overweight female in no acute distress  Eyes:    PERRL, conjunctiva/corneas clear, EOM's intact       Lungs:     Clear to auscultation bilaterally, respirations unlabored  Heart:    Bradycardic. Normal rhythm. No murmurs, rubs, or gallops.    MS:   All extremities are intact.    Neurologic:   Awake, alert, oriented x 3. No apparent focal neurological defect.          Assessment & Plan     1. Dyspnea on exertion Responded to IV steroids and oral azithromycin during recent hospitalization. Slowly improving since discharge and no longer requiring supplemental oxygen, but subjectively not back to baseline.   Inpatient evaluation remarkable only for mild bronchial thickening and small bilateral hilar nodes thought to be reactive.   - Basic Metabolic Panel (BMET) - CBC - B Nat Peptide  2. Hypoxia Improving. Was not oxygen dependent  prior to this episode. Will refer to pulmonary to evaluate for underlying lung disease.  3. Malignant neoplasm of upper-outer quadrant of left breast in female, estrogen receptor positive ([place]) Follow up oncology as scheduled.         The entirety of the information documented in the History of Present Illness, Review of Systems and Physical Exam were personally obtained by me. Portions of this information were initially documented by the CMA and reviewed by me for thoroughness and accuracy.     Lelon Huh, MD  [facility] [phone] (phone) [phone] (fax)  [place]

## 2021-01-31 ENCOUNTER — Other Ambulatory Visit: Payer: Self-pay

## 2021-01-31 ENCOUNTER — Ambulatory Visit: Payer: Medicare PPO

## 2021-01-31 ENCOUNTER — Other Ambulatory Visit: Payer: Self-pay | Admitting: *Deleted

## 2021-01-31 ENCOUNTER — Ambulatory Visit
Admission: RE | Admit: 2021-01-31 | Discharge: 2021-01-31 | Disposition: A | Discharge status: Home / Self Care | Payer: Medicare PPO | Source: Ambulatory Visit | Attending: Radiation Oncology | Admitting: Radiation Oncology

## 2021-01-31 ENCOUNTER — Encounter: Payer: Self-pay | Admitting: Oncology

## 2021-01-31 ENCOUNTER — Inpatient Hospital Stay: Payer: Medicare PPO

## 2021-01-31 DIAGNOSIS — C773 Secondary and unspecified malignant neoplasm of axilla and upper limb lymph nodes: Secondary | ICD-10-CM | POA: Diagnosis not present

## 2021-01-31 DIAGNOSIS — C50412 Malignant neoplasm of upper-outer quadrant of left female breast: Secondary | ICD-10-CM

## 2021-01-31 DIAGNOSIS — Z510 Encounter for antineoplastic radiation therapy: Secondary | ICD-10-CM | POA: Diagnosis not present

## 2021-01-31 DIAGNOSIS — Z170 Estrogen receptor positive status [ER+]: Secondary | ICD-10-CM | POA: Diagnosis not present

## 2021-01-31 LAB — CBC
Hematocrit: 35.4 % (ref 34.0–46.6)
Hemoglobin: 11.4 g/dL (ref 11.1–15.9)
MCH: 30.3 pg (ref 26.6–33.0)
MCHC: 32.2 g/dL (ref 31.5–35.7)
MCV: 94 fL (ref 79–97)
Platelets: 278 10*3/uL (ref 150–450)
RBC: 3.76 x10E6/uL — ABNORMAL LOW (ref 3.77–5.28)
RDW: 13.4 % (ref 11.7–15.4)
WBC: 8.1 10*3/uL (ref 3.4–10.8)

## 2021-01-31 LAB — BASIC METABOLIC PANEL
BUN/Creatinine Ratio: 24 (ref 12–28)
BUN: 23 mg/dL (ref 8–27)
CO2: 27 mmol/L (ref 20–29)
Calcium: 9.3 mg/dL (ref 8.7–10.3)
Chloride: 103 mmol/L (ref 96–106)
Creatinine, Ser: 0.96 mg/dL (ref 0.57–1.00)
Glucose: 91 mg/dL (ref 70–99)
Potassium: 3.9 mmol/L (ref 3.5–5.2)
Sodium: 143 mmol/L (ref 134–144)
eGFR: 63 mL/min/{1.73_m2} (ref >59)

## 2021-01-31 LAB — BRAIN NATRIURETIC PEPTIDE: BNP: 23.9 pg/mL (ref 0.0–100.0)

## 2021-01-31 MED ORDER — SUCRALFATE 1 G PO TABS: 1.0000 g | ORAL_TABLET | Freq: Three times a day (TID) | ORAL | 1 refills | Status: DC

## 2021-01-31 NOTE — Telephone Encounter (Signed)
Patient advised.

## 2021-02-01 ENCOUNTER — Ambulatory Visit
Admission: RE | Admit: 2021-02-01 | Discharge: 2021-02-01 | Disposition: A | Discharge status: Home / Self Care | Payer: Medicare PPO | Source: Ambulatory Visit | Attending: Radiation Oncology | Admitting: Radiation Oncology

## 2021-02-01 ENCOUNTER — Ambulatory Visit: Payer: Medicare PPO

## 2021-02-01 DIAGNOSIS — Z170 Estrogen receptor positive status [ER+]: Secondary | ICD-10-CM | POA: Diagnosis not present

## 2021-02-01 DIAGNOSIS — C773 Secondary and unspecified malignant neoplasm of axilla and upper limb lymph nodes: Secondary | ICD-10-CM | POA: Diagnosis not present

## 2021-02-01 DIAGNOSIS — C50412 Malignant neoplasm of upper-outer quadrant of left female breast: Secondary | ICD-10-CM | POA: Diagnosis not present

## 2021-02-01 DIAGNOSIS — Z510 Encounter for antineoplastic radiation therapy: Secondary | ICD-10-CM | POA: Diagnosis not present

## 2021-02-04 DIAGNOSIS — Z510 Encounter for antineoplastic radiation therapy: Secondary | ICD-10-CM | POA: Insufficient documentation

## 2021-02-04 DIAGNOSIS — Z170 Estrogen receptor positive status [ER+]: Secondary | ICD-10-CM | POA: Diagnosis not present

## 2021-02-04 DIAGNOSIS — C773 Secondary and unspecified malignant neoplasm of axilla and upper limb lymph nodes: Secondary | ICD-10-CM | POA: Insufficient documentation

## 2021-02-04 DIAGNOSIS — C50412 Malignant neoplasm of upper-outer quadrant of left female breast: Secondary | ICD-10-CM | POA: Insufficient documentation

## 2021-02-05 ENCOUNTER — Ambulatory Visit: Payer: Medicare PPO | Admitting: Family Medicine

## 2021-02-05 ENCOUNTER — Ambulatory Visit: Payer: Medicare PPO

## 2021-02-05 ENCOUNTER — Ambulatory Visit
Admission: RE | Admit: 2021-02-05 | Discharge: 2021-02-05 | Disposition: A | Discharge status: Home / Self Care | Payer: Medicare PPO | Source: Ambulatory Visit | Attending: Radiation Oncology | Admitting: Radiation Oncology

## 2021-02-05 DIAGNOSIS — Z170 Estrogen receptor positive status [ER+]: Secondary | ICD-10-CM | POA: Diagnosis not present

## 2021-02-05 DIAGNOSIS — C773 Secondary and unspecified malignant neoplasm of axilla and upper limb lymph nodes: Secondary | ICD-10-CM | POA: Diagnosis not present

## 2021-02-05 DIAGNOSIS — C50412 Malignant neoplasm of upper-outer quadrant of left female breast: Secondary | ICD-10-CM | POA: Diagnosis not present

## 2021-02-05 DIAGNOSIS — Z510 Encounter for antineoplastic radiation therapy: Secondary | ICD-10-CM | POA: Diagnosis not present

## 2021-02-06 ENCOUNTER — Other Ambulatory Visit: Payer: Medicare PPO

## 2021-02-06 ENCOUNTER — Ambulatory Visit
Admission: RE | Admit: 2021-02-06 | Discharge: 2021-02-06 | Disposition: A | Discharge status: Home / Self Care | Payer: Medicare PPO | Source: Ambulatory Visit | Attending: Radiation Oncology | Admitting: Radiation Oncology

## 2021-02-06 ENCOUNTER — Ambulatory Visit: Payer: Medicare PPO

## 2021-02-06 ENCOUNTER — Ambulatory Visit: Payer: Medicare PPO | Admitting: Oncology

## 2021-02-06 DIAGNOSIS — C50412 Malignant neoplasm of upper-outer quadrant of left female breast: Secondary | ICD-10-CM | POA: Diagnosis not present

## 2021-02-06 DIAGNOSIS — C773 Secondary and unspecified malignant neoplasm of axilla and upper limb lymph nodes: Secondary | ICD-10-CM | POA: Diagnosis not present

## 2021-02-06 DIAGNOSIS — Z170 Estrogen receptor positive status [ER+]: Secondary | ICD-10-CM | POA: Diagnosis not present

## 2021-02-06 DIAGNOSIS — Z510 Encounter for antineoplastic radiation therapy: Secondary | ICD-10-CM | POA: Diagnosis not present

## 2021-02-07 ENCOUNTER — Ambulatory Visit: Payer: Medicare PPO

## 2021-02-07 ENCOUNTER — Ambulatory Visit
Admission: RE | Admit: 2021-02-07 | Discharge: 2021-02-07 | Disposition: A | Discharge status: Home / Self Care | Payer: Medicare PPO | Source: Ambulatory Visit | Attending: Radiation Oncology | Admitting: Radiation Oncology

## 2021-02-07 DIAGNOSIS — Z510 Encounter for antineoplastic radiation therapy: Secondary | ICD-10-CM | POA: Diagnosis not present

## 2021-02-07 DIAGNOSIS — Z170 Estrogen receptor positive status [ER+]: Secondary | ICD-10-CM | POA: Diagnosis not present

## 2021-02-07 DIAGNOSIS — C50412 Malignant neoplasm of upper-outer quadrant of left female breast: Secondary | ICD-10-CM | POA: Diagnosis not present

## 2021-02-07 DIAGNOSIS — C773 Secondary and unspecified malignant neoplasm of axilla and upper limb lymph nodes: Secondary | ICD-10-CM | POA: Diagnosis not present

## 2021-02-08 ENCOUNTER — Ambulatory Visit: Payer: Medicare PPO

## 2021-02-08 ENCOUNTER — Ambulatory Visit
Admission: RE | Admit: 2021-02-08 | Discharge: 2021-02-08 | Disposition: A | Discharge status: Home / Self Care | Payer: Medicare PPO | Source: Ambulatory Visit | Attending: Radiation Oncology | Admitting: Radiation Oncology

## 2021-02-08 DIAGNOSIS — C773 Secondary and unspecified malignant neoplasm of axilla and upper limb lymph nodes: Secondary | ICD-10-CM | POA: Diagnosis not present

## 2021-02-08 DIAGNOSIS — Z510 Encounter for antineoplastic radiation therapy: Secondary | ICD-10-CM | POA: Diagnosis not present

## 2021-02-08 DIAGNOSIS — C50412 Malignant neoplasm of upper-outer quadrant of left female breast: Secondary | ICD-10-CM | POA: Diagnosis not present

## 2021-02-08 DIAGNOSIS — Z170 Estrogen receptor positive status [ER+]: Secondary | ICD-10-CM | POA: Diagnosis not present

## 2021-02-11 ENCOUNTER — Ambulatory Visit: Payer: Medicare PPO

## 2021-02-11 ENCOUNTER — Ambulatory Visit
Admission: RE | Admit: 2021-02-11 | Discharge: 2021-02-11 | Disposition: A | Discharge status: Home / Self Care | Payer: Medicare PPO | Source: Ambulatory Visit | Attending: Radiation Oncology | Admitting: Radiation Oncology

## 2021-02-11 ENCOUNTER — Ambulatory Visit
Admission: RE | Admit: 2021-02-11 | Discharge: 2021-02-11 | Disposition: A | Discharge status: Home / Self Care | Payer: Medicare PPO | Source: Ambulatory Visit | Attending: Oncology | Admitting: Oncology

## 2021-02-11 ENCOUNTER — Other Ambulatory Visit: Payer: Self-pay

## 2021-02-11 DIAGNOSIS — Z9221 Personal history of antineoplastic chemotherapy: Secondary | ICD-10-CM | POA: Insufficient documentation

## 2021-02-11 DIAGNOSIS — J449 Chronic obstructive pulmonary disease, unspecified: Secondary | ICD-10-CM | POA: Insufficient documentation

## 2021-02-11 DIAGNOSIS — M85851 Other specified disorders of bone density and structure, right thigh: Secondary | ICD-10-CM | POA: Insufficient documentation

## 2021-02-11 DIAGNOSIS — Z170 Estrogen receptor positive status [ER+]: Secondary | ICD-10-CM

## 2021-02-11 DIAGNOSIS — C50412 Malignant neoplasm of upper-outer quadrant of left female breast: Secondary | ICD-10-CM | POA: Diagnosis not present

## 2021-02-11 DIAGNOSIS — Z853 Personal history of malignant neoplasm of breast: Secondary | ICD-10-CM | POA: Insufficient documentation

## 2021-02-11 DIAGNOSIS — Z923 Personal history of irradiation: Secondary | ICD-10-CM | POA: Insufficient documentation

## 2021-02-11 DIAGNOSIS — Z13820 Encounter for screening for osteoporosis: Secondary | ICD-10-CM | POA: Diagnosis not present

## 2021-02-11 DIAGNOSIS — C773 Secondary and unspecified malignant neoplasm of axilla and upper limb lymph nodes: Secondary | ICD-10-CM | POA: Diagnosis not present

## 2021-02-11 DIAGNOSIS — Z780 Asymptomatic menopausal state: Secondary | ICD-10-CM | POA: Diagnosis not present

## 2021-02-11 DIAGNOSIS — Z510 Encounter for antineoplastic radiation therapy: Secondary | ICD-10-CM | POA: Diagnosis not present

## 2021-02-12 ENCOUNTER — Ambulatory Visit: Payer: Medicare PPO

## 2021-02-12 ENCOUNTER — Ambulatory Visit
Admission: RE | Admit: 2021-02-12 | Discharge: 2021-02-12 | Disposition: A | Discharge status: Home / Self Care | Payer: Medicare PPO | Source: Ambulatory Visit | Attending: Radiation Oncology | Admitting: Radiation Oncology

## 2021-02-12 DIAGNOSIS — C50412 Malignant neoplasm of upper-outer quadrant of left female breast: Secondary | ICD-10-CM | POA: Diagnosis not present

## 2021-02-12 DIAGNOSIS — C773 Secondary and unspecified malignant neoplasm of axilla and upper limb lymph nodes: Secondary | ICD-10-CM | POA: Diagnosis not present

## 2021-02-12 DIAGNOSIS — Z510 Encounter for antineoplastic radiation therapy: Secondary | ICD-10-CM | POA: Diagnosis not present

## 2021-02-12 DIAGNOSIS — Z170 Estrogen receptor positive status [ER+]: Secondary | ICD-10-CM | POA: Diagnosis not present

## 2021-02-13 ENCOUNTER — Ambulatory Visit
Admission: RE | Admit: 2021-02-13 | Discharge: 2021-02-13 | Disposition: A | Discharge status: Home / Self Care | Payer: Medicare PPO | Source: Ambulatory Visit | Attending: Radiation Oncology | Admitting: Radiation Oncology

## 2021-02-13 ENCOUNTER — Ambulatory Visit: Payer: Medicare PPO

## 2021-02-13 DIAGNOSIS — Z170 Estrogen receptor positive status [ER+]: Secondary | ICD-10-CM | POA: Diagnosis not present

## 2021-02-13 DIAGNOSIS — Z510 Encounter for antineoplastic radiation therapy: Secondary | ICD-10-CM | POA: Diagnosis not present

## 2021-02-13 DIAGNOSIS — C773 Secondary and unspecified malignant neoplasm of axilla and upper limb lymph nodes: Secondary | ICD-10-CM | POA: Diagnosis not present

## 2021-02-13 DIAGNOSIS — C50412 Malignant neoplasm of upper-outer quadrant of left female breast: Secondary | ICD-10-CM | POA: Diagnosis not present

## 2021-02-14 ENCOUNTER — Ambulatory Visit
Admission: RE | Admit: 2021-02-14 | Discharge: 2021-02-14 | Disposition: A | Discharge status: Home / Self Care | Payer: Medicare PPO | Source: Ambulatory Visit | Attending: Radiation Oncology | Admitting: Radiation Oncology

## 2021-02-14 DIAGNOSIS — Z170 Estrogen receptor positive status [ER+]: Secondary | ICD-10-CM | POA: Diagnosis not present

## 2021-02-14 DIAGNOSIS — C50412 Malignant neoplasm of upper-outer quadrant of left female breast: Secondary | ICD-10-CM | POA: Diagnosis not present

## 2021-02-14 DIAGNOSIS — Z510 Encounter for antineoplastic radiation therapy: Secondary | ICD-10-CM | POA: Diagnosis not present

## 2021-02-14 DIAGNOSIS — C773 Secondary and unspecified malignant neoplasm of axilla and upper limb lymph nodes: Secondary | ICD-10-CM | POA: Diagnosis not present

## 2021-02-15 ENCOUNTER — Other Ambulatory Visit: Payer: Self-pay

## 2021-02-15 ENCOUNTER — Ambulatory Visit: Payer: Medicare PPO

## 2021-02-15 ENCOUNTER — Ambulatory Visit
Admission: RE | Admit: 2021-02-15 | Discharge: 2021-02-15 | Disposition: A | Discharge status: Home / Self Care | Payer: Medicare PPO | Source: Ambulatory Visit | Attending: Radiation Oncology | Admitting: Radiation Oncology

## 2021-02-15 ENCOUNTER — Inpatient Hospital Stay: Payer: Medicare PPO | Admitting: Oncology

## 2021-02-15 ENCOUNTER — Inpatient Hospital Stay: Payer: Medicare PPO

## 2021-02-15 DIAGNOSIS — M85851 Other specified disorders of bone density and structure, right thigh: Secondary | ICD-10-CM | POA: Insufficient documentation

## 2021-02-15 DIAGNOSIS — Z510 Encounter for antineoplastic radiation therapy: Secondary | ICD-10-CM | POA: Diagnosis not present

## 2021-02-15 DIAGNOSIS — Z9981 Dependence on supplemental oxygen: Secondary | ICD-10-CM | POA: Insufficient documentation

## 2021-02-15 DIAGNOSIS — C50412 Malignant neoplasm of upper-outer quadrant of left female breast: Secondary | ICD-10-CM | POA: Insufficient documentation

## 2021-02-15 DIAGNOSIS — C773 Secondary and unspecified malignant neoplasm of axilla and upper limb lymph nodes: Secondary | ICD-10-CM | POA: Diagnosis not present

## 2021-02-15 DIAGNOSIS — Z170 Estrogen receptor positive status [ER+]: Secondary | ICD-10-CM | POA: Diagnosis not present

## 2021-02-15 LAB — COMPREHENSIVE METABOLIC PANEL
ALT: 21 U/L (ref 0–44)
AST: 22 U/L (ref 15–41)
Albumin: 3.8 g/dL (ref 3.5–5.0)
Alkaline Phosphatase: 124 U/L (ref 38–126)
Anion gap: 5 (ref 5–15)
BUN: 21 mg/dL (ref 8–23)
CO2: 30 mmol/L (ref 22–32)
Calcium: 9 mg/dL (ref 8.9–10.3)
Chloride: 99 mmol/L (ref 98–111)
Creatinine, Ser: 0.83 mg/dL (ref 0.44–1.00)
GFR, Estimated: >60 mL/min (ref >60)
Glucose, Bld: 109 mg/dL — ABNORMAL HIGH (ref 70–99)
Potassium: 3.4 mmol/L — ABNORMAL LOW (ref 3.5–5.1)
Sodium: 134 mmol/L — ABNORMAL LOW (ref 135–145)
Total Bilirubin: 0.4 mg/dL (ref 0.3–1.2)
Total Protein: 7 g/dL (ref 6.5–8.1)

## 2021-02-15 LAB — CBC WITH DIFFERENTIAL/PLATELET
Abs Immature Granulocytes: 0.02 10*3/uL (ref 0.00–0.07)
Basophils Absolute: 0 10*3/uL (ref 0.0–0.1)
Basophils Relative: 1 %
Eosinophils Absolute: 0.2 10*3/uL (ref 0.0–0.5)
Eosinophils Relative: 3 %
HCT: 36 % (ref 36.0–46.0)
Hemoglobin: 11.5 g/dL — ABNORMAL LOW (ref 12.0–15.0)
Immature Granulocytes: 0 %
Lymphocytes Relative: 27 %
Lymphs Abs: 1.4 10*3/uL (ref 0.7–4.0)
MCH: 30 pg (ref 26.0–34.0)
MCHC: 31.9 g/dL (ref 30.0–36.0)
MCV: 94 fL (ref 80.0–100.0)
Monocytes Absolute: 0.7 10*3/uL (ref 0.1–1.0)
Monocytes Relative: 13 %
Neutro Abs: 2.9 10*3/uL (ref 1.7–7.7)
Neutrophils Relative %: 56 %
Platelets: 255 10*3/uL (ref 150–400)
RBC: 3.83 MIL/uL — ABNORMAL LOW (ref 3.87–5.11)
RDW: 14 % (ref 11.5–15.5)
WBC: 5.1 10*3/uL (ref 4.0–10.5)
nRBC: 0 % (ref 0.0–0.2)

## 2021-02-15 MED ORDER — SODIUM CHLORIDE 0.9% FLUSH
10.0000 mL | Freq: Once | INTRAVENOUS | Status: AC
  Administered 2021-02-15: 10 mL via INTRAVENOUS
  Filled 2021-02-15: qty 10

## 2021-02-15 MED ORDER — HEPARIN SOD (PORK) LOCK FLUSH 100 UNIT/ML IV SOLN
500.0000 [IU] | Freq: Once | INTRAVENOUS | Status: AC
  Administered 2021-02-15: 500 [IU] via INTRAVENOUS
  Filled 2021-02-15: qty 5

## 2021-02-18 ENCOUNTER — Ambulatory Visit: Payer: Medicare PPO

## 2021-02-18 ENCOUNTER — Ambulatory Visit
Admission: RE | Admit: 2021-02-18 | Discharge: 2021-02-18 | Disposition: A | Discharge status: Home / Self Care | Payer: Medicare PPO | Source: Ambulatory Visit | Attending: Radiation Oncology | Admitting: Radiation Oncology

## 2021-02-18 DIAGNOSIS — C50412 Malignant neoplasm of upper-outer quadrant of left female breast: Secondary | ICD-10-CM | POA: Diagnosis not present

## 2021-02-18 DIAGNOSIS — Z510 Encounter for antineoplastic radiation therapy: Secondary | ICD-10-CM | POA: Diagnosis not present

## 2021-02-18 DIAGNOSIS — Z170 Estrogen receptor positive status [ER+]: Secondary | ICD-10-CM | POA: Diagnosis not present

## 2021-02-18 DIAGNOSIS — C773 Secondary and unspecified malignant neoplasm of axilla and upper limb lymph nodes: Secondary | ICD-10-CM | POA: Diagnosis not present

## 2021-02-19 ENCOUNTER — Other Ambulatory Visit: Payer: Self-pay

## 2021-02-19 ENCOUNTER — Ambulatory Visit: Payer: Medicare PPO | Admitting: Pulmonary Disease

## 2021-02-19 ENCOUNTER — Encounter: Payer: Self-pay | Admitting: Pulmonary Disease

## 2021-02-19 ENCOUNTER — Ambulatory Visit
Admission: RE | Admit: 2021-02-19 | Discharge: 2021-02-19 | Disposition: A | Discharge status: Home / Self Care | Payer: Medicare PPO | Source: Ambulatory Visit | Attending: Radiation Oncology | Admitting: Radiation Oncology

## 2021-02-19 ENCOUNTER — Ambulatory Visit: Payer: Medicare PPO

## 2021-02-19 VITALS — BP 124/74 | HR 96 | Temp 97.9°F | Ht 64.0 in | Wt 234.2 lb

## 2021-02-19 DIAGNOSIS — J42 Unspecified chronic bronchitis: Secondary | ICD-10-CM | POA: Diagnosis not present

## 2021-02-19 DIAGNOSIS — Z510 Encounter for antineoplastic radiation therapy: Secondary | ICD-10-CM | POA: Diagnosis not present

## 2021-02-19 DIAGNOSIS — C50412 Malignant neoplasm of upper-outer quadrant of left female breast: Secondary | ICD-10-CM | POA: Diagnosis not present

## 2021-02-19 DIAGNOSIS — C773 Secondary and unspecified malignant neoplasm of axilla and upper limb lymph nodes: Secondary | ICD-10-CM | POA: Diagnosis not present

## 2021-02-19 DIAGNOSIS — R0602 Shortness of breath: Secondary | ICD-10-CM | POA: Diagnosis not present

## 2021-02-19 DIAGNOSIS — Z170 Estrogen receptor positive status [ER+]: Secondary | ICD-10-CM

## 2021-02-19 MED ORDER — TRELEGY ELLIPTA 100-62.5-25 MCG/ACT IN AEPB: 100.0000 ug | INHALATION_SPRAY | Freq: Every day | RESPIRATORY_TRACT | 0 refills | Status: DC

## 2021-02-19 MED ORDER — ALBUTEROL SULFATE HFA 108 (90 BASE) MCG/ACT IN AERS: 2.0000 | INHALATION_SPRAY | Freq: Four times a day (QID) | RESPIRATORY_TRACT | 2 refills | Status: DC | PRN

## 2021-02-19 NOTE — Patient Instructions (Addendum)
We will send a prescription for albuterol which is an emergency inhaler that you may use when you feel short of breath.  You may use it up to 4 times a day.  You a trial of an inhaler called Trelegy Ellipta that this is 1 puff daily make sure you rinse your mouth well after you use it.  Let us know how you do with this inhaler.  Ordered breathing tests.  These will be done in a "short version" given your issues with enclosed spaces.  We will see you in follow-up in 6 to 8 weeks time call sooner should any new problems arise.

## 2021-02-19 NOTE — Progress Notes (Signed)
Subjective:    Patient ID: Victoria Dean, female    DOB: 1950/06/04, 71 y.o.   MRN: [id number] Patient Care Team: Jerrol Banana., MD as PCP - General (Family Medicine) Lorelee Cover., MD as Consulting Physician (Ophthalmology) Dimmig, Marcello Moores, MD as Referring Physician (Orthopedic Surgery) Oneta Rack, MD (Dermatology) Dasher, Rayvon Char, MD (Dermatology) Sindy Guadeloupe, MD as Consulting Physician (Hematology and Oncology) Noreene Filbert, MD as Consulting Physician (Radiation Oncology) Bary Castilla [name], MD as Consulting Physician (General Surgery)  Chief Complaint  Patient presents with   Consult    Shortness of breath   HPI Patient is a 71 year old former smoker (quit 2018, 31 PY) presents for evaluation of dyspnea with any activity/exertion.  She is kindly referred by Dr. Noreene Filbert, her primary care physician is Dr. Miguel Aschoff.  He states she started noticing dyspnea approximately 14-Sep-2020 and was more pronounced and noticeable in 14-Feb-2021.  He states that when she develops dyspnea on exertion sitting still and long slow breaths help her.  She has not been using any inhalers.  She was diagnosed with bronchitis in 02-15-23 at the time that she had developed a cough that was not really productive.  She was admitted to [facility] between 17 December through 20 December and treated with IV steroids, azithromycin and discharged home with p.o. steroids and azithromycin.  She also required oxygen transiently but not required this since discharge.  He was supposed to be on DuoNeb however did not receive this prescription, she never received these and the hospital and did not feel that these were helpful and would give her tachypalpitations.  She has not had any recent fevers, chills or sweats since her discharge from [facility].  A CT chest performed on 19 January 2021 showed no evidence of pulmonary emboli, there was bronchial wall thickening consistent with underlying bronchitis.   All studies were negative during admission.  Peptide was 23.  Thyroid function was normal.  She does not endorse any chest pain, paroxysmal nocturnal dyspnea nor orthopnea.  No lower extremity edema or calf tenderness.  Gastroesophageal reflux symptoms.    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  Underwent whole breast peripheral lymphatic radiation completed in January 2023.  She completed 12 cycles of weekly Taxol chemotherapy in October 2022.  Review of Systems A 10 point review of systems was performed and it is as noted above otherwise negative.  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    Vitamin D deficiency    Past Surgical History:  Procedure Laterality Date   ABDOMINAL HYSTERECTOMY  1995   APPENDECTOMY  1994   BREAST BIOPSY Right 1998   neg   BREAST BIOPSY Left 05/30/2020   stere bx x clip path pending   BREAST LUMPECTOMY WITH SENTINEL LYMPH NODE BIOPSY Left 06/29/2020   Procedure: BREAST LUMPECTOMY WITH SENTINEL LYMPH NODE BX;  Surgeon: Hervey Ard  W, MD;  Location: [facility] ORS;  Service: General;  Laterality: Left;   ESI     LUMBAR MICRODISCECTOMY  2019   L3-4   LUMBAR MICRODISCECTOMY  2008   L4-L5 repeated   MICRODISCECTOMY LUMBAR  2008   L4-5   MICRODISCECTOMY LUMBAR  2010   L4-L5 repeated   NODE DISSECTION Left 08/13/2020   Procedure: AXILLARY NODE DISSECTION;  Surgeon: Robert Bellow, MD;  Location: [facility] ORS;  Service: General;  Laterality: Left;   PORTACATH PLACEMENT Right 09/05/2020   Procedure: INSERTION  PORT-A-CATH;  Surgeon: Robert Bellow, MD;  Location: [facility] ORS;  Service: General;  Laterality: Right;  right; monitor anesthesia care   [place]   Patient Active Problem List   Diagnosis Date Noted   Acute on chronic respiratory failure with hypoxia ([place]) 01/19/2021   Upper respiratory infection with cough and congestion 01/17/2021   B12 deficiency 10/09/2020   Genetic testing 06/26/2020   Family history of breast cancer    Family history of stomach cancer    Malignant neoplasm of upper-outer quadrant of left breast in female, estrogen receptor positive ([place]) 06/10/2020   Goals of care, counseling/discussion 06/10/2020   Impingement syndrome of right shoulder region 05/06/2019   Low back pain 01/24/2019   Personal history of tobacco use, presenting hazards to health 02/23/2018   History of lumbar laminectomy 04/30/2016   Actinic keratosis 11/21/2014   Adaptation reaction 11/21/2014   Allergic rhinitis 11/21/2014   Cornu cutaneum 11/21/2014   Clinical depression 11/21/2014   Dermatitis, eczematoid 11/21/2014   Enthesopathy 11/21/2014   Essential (primary) hypertension 11/21/2014   Anxiety, generalized 11/21/2014   Herniated nucleus pulposus 11/21/2014   Mild episode of recurrent major depressive disorder ([place]) 11/21/2014   Adiposity 11/21/2014   Arthritis, degenerative 11/21/2014   Basal cell papilloma 11/21/2014   Apnea, sleep 11/21/2014   Avitaminosis D 11/21/2014   Allergies  Allergen Reactions   Other Itching    Brand name bandaids with cloth backing causes redness and itching   Statins     Muscle weakness   Zetia [Ezetimibe]     Leg pain, upset stomach   Sulfa Antibiotics Rash   Current Meds  Medication Sig   acetaminophen (TYLENOL) 650 MG CR tablet Take 1,300 mg by mouth every 8 (eight) hours as needed for pain.   Biotin 1000 MCG tablet Take 1,000 mcg by mouth daily.   cholecalciferol (VITAMIN D) 25 MCG (1000  UNIT) tablet Take 1,000 Units by mouth daily.   diazepam (VALIUM) 5 MG tablet Take 1 tablet (5 mg total) by mouth every 12 (twelve) hours as needed for anxiety.   guaiFENesin-dextromethorphan (ROBITUSSIN DM) 100-10 MG/5ML syrup Take 5 mLs by mouth every 4 (four) hours as needed for cough.   hydrocortisone 2.5 % lotion Apply 1 application topically daily as needed (when washing face).   Ipratropium-Albuterol (COMBIVENT RESPIMAT) 20-100 MCG/ACT AERS respimat Inhale 1 puff into the lungs every 6 (six) hours as needed for wheezing.   lidocaine-prilocaine (EMLA) cream Apply to affected area once   losartan (COZAAR) 100 MG tablet TAKE 1 TABLET (100 MG TOTAL) BY MOUTH DAILY.   Menthol, Topical Analgesic, (BIOFREEZE EX) Apply 1 application topically daily as needed (back pain).   mometasone (ELOCON) 0.1 % cream Apply 1 application topically daily as needed (ear irritation).   ondansetron (ZOFRAN) 8 MG tablet Take 1 tablet (8 mg total) by mouth 2 (two) times daily as needed (  Nausea or vomiting).   Polyethyl Glycol-Propyl Glycol (SYSTANE OP) Place 1 drop into both eyes daily as needed (dry eyes).   potassium chloride SA (KLOR-CON) 20 MEQ tablet Take 1 tablet (20 mEq total) by mouth daily.   prochlorperazine (COMPAZINE) 10 MG tablet Take 1 tablet (10 mg total) by mouth every 6 (six) hours as needed (Nausea or vomiting).   rosuvastatin (CRESTOR) 5 MG tablet TAKE 1 TABLET (5 MG TOTAL) BY MOUTH DAILY.   sucralfate (CARAFATE) 1 g tablet Take 1 tablet (1 g total) by mouth 3 (three) times daily. Dissolve in 3-4 tbsp warm water, swish and swallow.   triamterene-hydrochlorothiazide (DYAZIDE) 37.5-25 MG capsule Take 1 each (1 capsule total) by mouth daily.   venlafaxine XR (EFFEXOR-XR) 75 MG 24 hr capsule TAKE 3 CAPSULES (225 MG TOTAL) BY MOUTH DAILY WITH BREAKFAST.   Immunization History  Administered Date(s) Administered   Fluad Quad(high Dose 65+) 09/27/2018, 10/17/2019, 11/29/2020   Hepatitis B 06/12/1995,  07/15/1995, 12/15/1995   Influenza Split 01/01/2011, 11/24/2012   Influenza, High Dose Seasonal PF 11/22/2014, 10/23/2015, 11/12/2016, 12/08/2017   Influenza,inj,Quad PF,6+ Mos 11/13/2013   Influenza,inj,quad, With Preservative 10/05/2015   Pneumococcal Conjugate-13 10/11/2014   Pneumococcal Polysaccharide-23 11/08/2008, 01/24/2016   Pneumococcal-Unspecified 01/04/2015   Td 06/21/2004   Tdap 03/23/2014   Zoster, Live 11/13/2013       Objective:   Physical Exam BP 124/74 (BP Location: Left Arm, Patient Position: Sitting, Cuff Size: Normal)    Pulse 96    Temp 97.9 F (36.6 C) (Oral)    Ht 5\' 4"  (1.626 m)    Wt 234 lb 3.2 oz (106.2 kg)    SpO2 95%    BMI 40.20 kg/m  GENERAL: Obese woman, no acute distress, ambulatory with assistance assistance of a cane HEAD: Normocephalic, atraumatic.  EYES: Pupils equal, round, reactive to light.  No scleral icterus.  MOUTH: Nose/mouth/throat not examined due to masking requirements for COVID 19. NECK: Supple. No thyromegaly. Trachea midline. No JVD.  No adenopathy. PULMONARY: Good air entry bilaterally.  Coarse, otherwise, no adventitious sounds. CARDIOVASCULAR: S1 and S2. Regular rate and rhythm.  No rubs, murmurs or gallops heard. ABDOMEN: Obese, otherwise benign. MUSCULOSKELETAL: No joint deformity, no clubbing, no edema.  NEUROLOGIC: Walks with assistance of a cane.  Otherwise nonfocal. SKIN: Intact,warm,dry. PSYCH: Somewhat anxious mood, normal behavior.  Representative image from CT performed 01/19/2021:     Assessment & Plan:     ICD-10-CM   1. Shortness of breath  R06.02 Pulmonary Function Test [facility] Only   Suspect multifactorial Chronic bronchitis likely Obesity/deconditioning also a factor Recent chemotherapy/radiation therapy    2. Chronic bronchitis, unspecified chronic bronchitis type ([place])  [id number] Pulmonary Function Test [facility] Only   Will obtain PFTs Trial of Trelegy Ellipta 1 puff daily As needed albuterol    3. Malignant  neoplasm of upper-outer quadrant of left breast in female, estrogen receptor positive ([place])  C50.412    Z17.0    This issue adds complexity to her management Recent chemotherapy and radiation therapy     Orders Placed This Encounter  Procedures   Pulmonary Function Test [facility] Only    Spirometry pre and post and DLCO.  Patient is claustrophobic will not be able to do plethysmograph for lung volumes.    Standing Status:   Future    Number of Occurrences:   1    Standing Expiration Date:   02/19/2022    Order Specific Question:   Diffusion capacity (DLCO)  Answer:   Yes    Order Specific Question:   Lung volumes    Answer:   No    Order Specific Question:   This test can only be performed at    Answer:   [place] ordered this encounter  Medications   Fluticasone-Umeclidin-Vilant (TRELEGY ELLIPTA) 100-62.5-25 MCG/ACT AEPB    Sig: Inhale 100 mcg into the lungs daily.    Dispense:  28 each    Refill:  0    Order Specific Question:   Lot Number?    Answer:   [id number]    Order Specific Question:   Expiration Date?    Answer:   08/03/2022    Order Specific Question:   Quantity    Answer:   2   albuterol (VENTOLIN HFA) 108 (90 Base) MCG/ACT inhaler    Sig: Inhale 2 puffs into the lungs every 6 (six) hours as needed for wheezing or shortness of breath.    Dispense:  8 g    Refill:  2   We will see the patient in follow-up in 6 to 8 weeks time she is to contact us prior to that time should any new difficulties arise.  Renold Don, MD Advanced Bronchoscopy PCCM [facility]    *This note was dictated using voice recognition software/Dragon.  Despite best efforts to proofread, errors can occur which can change the meaning. Any transcriptional errors that result from this process are unintentional and may not be fully corrected at the time of dictation.

## 2021-02-20 ENCOUNTER — Ambulatory Visit
Admission: RE | Admit: 2021-02-20 | Discharge: 2021-02-20 | Disposition: A | Discharge status: Home / Self Care | Payer: Medicare PPO | Source: Ambulatory Visit | Attending: Radiation Oncology | Admitting: Radiation Oncology

## 2021-02-20 DIAGNOSIS — C50412 Malignant neoplasm of upper-outer quadrant of left female breast: Secondary | ICD-10-CM | POA: Diagnosis not present

## 2021-02-20 DIAGNOSIS — Z510 Encounter for antineoplastic radiation therapy: Secondary | ICD-10-CM | POA: Diagnosis not present

## 2021-02-20 DIAGNOSIS — C773 Secondary and unspecified malignant neoplasm of axilla and upper limb lymph nodes: Secondary | ICD-10-CM | POA: Diagnosis not present

## 2021-02-20 DIAGNOSIS — Z170 Estrogen receptor positive status [ER+]: Secondary | ICD-10-CM | POA: Diagnosis not present

## 2021-02-21 ENCOUNTER — Ambulatory Visit
Admission: RE | Admit: 2021-02-21 | Discharge: 2021-02-21 | Disposition: A | Discharge status: Home / Self Care | Payer: Medicare PPO | Source: Ambulatory Visit | Attending: Radiation Oncology | Admitting: Radiation Oncology

## 2021-02-21 DIAGNOSIS — Z510 Encounter for antineoplastic radiation therapy: Secondary | ICD-10-CM | POA: Diagnosis not present

## 2021-02-21 DIAGNOSIS — C50412 Malignant neoplasm of upper-outer quadrant of left female breast: Secondary | ICD-10-CM | POA: Diagnosis not present

## 2021-02-21 DIAGNOSIS — Z170 Estrogen receptor positive status [ER+]: Secondary | ICD-10-CM | POA: Diagnosis not present

## 2021-02-21 DIAGNOSIS — C773 Secondary and unspecified malignant neoplasm of axilla and upper limb lymph nodes: Secondary | ICD-10-CM | POA: Diagnosis not present

## 2021-02-22 ENCOUNTER — Ambulatory Visit
Admission: RE | Admit: 2021-02-22 | Discharge: 2021-02-22 | Disposition: A | Discharge status: Home / Self Care | Payer: Medicare PPO | Source: Ambulatory Visit | Attending: Radiation Oncology | Admitting: Radiation Oncology

## 2021-02-22 DIAGNOSIS — J069 Acute upper respiratory infection, unspecified: Secondary | ICD-10-CM | POA: Diagnosis not present

## 2021-02-22 DIAGNOSIS — Z510 Encounter for antineoplastic radiation therapy: Secondary | ICD-10-CM | POA: Diagnosis not present

## 2021-02-22 DIAGNOSIS — J309 Allergic rhinitis, unspecified: Secondary | ICD-10-CM | POA: Diagnosis not present

## 2021-02-22 DIAGNOSIS — Z170 Estrogen receptor positive status [ER+]: Secondary | ICD-10-CM | POA: Diagnosis not present

## 2021-02-22 DIAGNOSIS — C773 Secondary and unspecified malignant neoplasm of axilla and upper limb lymph nodes: Secondary | ICD-10-CM | POA: Diagnosis not present

## 2021-02-22 DIAGNOSIS — J9621 Acute and chronic respiratory failure with hypoxia: Secondary | ICD-10-CM | POA: Diagnosis not present

## 2021-02-22 DIAGNOSIS — C50412 Malignant neoplasm of upper-outer quadrant of left female breast: Secondary | ICD-10-CM | POA: Diagnosis not present

## 2021-02-25 ENCOUNTER — Telehealth: Payer: Self-pay

## 2021-02-25 NOTE — Telephone Encounter (Signed)
Pt aware of upcoming covid test, nothing further needed.

## 2021-02-26 ENCOUNTER — Inpatient Hospital Stay: Payer: Medicare PPO | Admitting: Oncology

## 2021-02-26 ENCOUNTER — Other Ambulatory Visit: Payer: Self-pay

## 2021-02-26 ENCOUNTER — Other Ambulatory Visit: Payer: Self-pay | Admitting: *Deleted

## 2021-02-26 ENCOUNTER — Encounter: Payer: Self-pay | Admitting: Oncology

## 2021-02-26 VITALS — BP 139/77 | HR 90 | Temp 98.9°F | Resp 18 | Ht 64.0 in | Wt 234.0 lb

## 2021-02-26 DIAGNOSIS — C50412 Malignant neoplasm of upper-outer quadrant of left female breast: Secondary | ICD-10-CM

## 2021-02-26 DIAGNOSIS — Z7189 Other specified counseling: Secondary | ICD-10-CM

## 2021-02-26 DIAGNOSIS — Z170 Estrogen receptor positive status [ER+]: Secondary | ICD-10-CM

## 2021-02-26 DIAGNOSIS — C773 Secondary and unspecified malignant neoplasm of axilla and upper limb lymph nodes: Secondary | ICD-10-CM | POA: Diagnosis not present

## 2021-02-26 DIAGNOSIS — Z510 Encounter for antineoplastic radiation therapy: Secondary | ICD-10-CM | POA: Diagnosis not present

## 2021-02-26 MED ORDER — LETROZOLE 2.5 MG PO TABS: 2.5000 mg | ORAL_TABLET | Freq: Every day | ORAL | 3 refills | Status: DC

## 2021-02-26 NOTE — Progress Notes (Signed)
Hematology/Oncology Consult note [facility]  [phone] [phone]  Patient Care Team: Maple Hudson., MD as PCP - General (Family Medicine) Irene Limbo., MD as Consulting Physician (Ophthalmology) Dimmig, Maisie Fus, MD as Referring Physician (Orthopedic Surgery) Debbrah Alar, MD (Dermatology) Dasher, Cliffton Asters, MD (Dermatology) Creig Hines, MD as Consulting Physician (Hematology and Oncology)   Name of the patient: Victoria Dean  [id number]  1950-12-10   Date of visit: 02/26/21  Diagnosis- pathological prognostic stage Ib invasive lobular carcinoma of the left breast [id number] ER/PR positive HER2 negative    Chief complaint/ Reason for visit-discuss endocrine therapy options for breast cancer  Heme/Onc history: Patient is a 71 year old female with a past medical history significant for claustrophobia, hypertension hypercholesterolemia, depression among other medical problems.  Patient began to notice retraction of her left nipple.  This was followed by a diagnostic mammogram which showed vague shadowing in the upper outer quadrant of the left breast without a definitive mass.  Benign cysts in the lower left breast measuring 1.5 cm.  No evidence of malignancy in the right breast.  No evidence of abnormal left axillary lymph nodes.  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   Bilateral breast MRI showed 7 x 6 x 4 cm area of non-mass enhancement in the upper outer left breast at 1 o'clock position.  This involves anterior and middle third of the breast and extension into the posterior third.  2 inferior left axillary lymph nodes with mild cortical thickening.   PET CT scan showed 1.7 cm hypermetabolic nodule in the midline of the neck which could  represent thyroglossal cyst but malignancy cannot be excluded. Patient underwent left-sided lumpectomy with nipple excision.     Final pathology showed invasive lobular carcinoma grade 210.5 cm with associated DCIS.  Skin/nipple are present and involved.  Invasive carcinoma invades the nipple without skin ulceration.  Lymphovascular invasion present.  Margins negative.  3 out of 4 lymph nodes positive for malignancy 11 mm largest deposit with extranodal extension.  pT3 pN1 a cM0   Patient was taken back for axillary dissection given 3 lymph nodes positive with extranodal extension and 4 more lymph nodes out of 9 were positive with extranodal extension   Given cardiovascular comorbidities and patient age none anthracycline adjuvant chemotherapy options were discussed.  Patient did not wish to proceed with TC chemotherapy.  She completed 12 cycles of weekly Taxol chemotherapy in October 2022 followed by adjuvant radiation therapy.  Interval history-patient was hospitalized for an episode of acute bronchitis requiring steroids in December 2022.She is not presently on home oxygen.  She was also seen by Dr. Jayme Cloud for consult.  ECOG PS- 2 Pain scale- 0   Review of systems- Review of Systems  Constitutional:  Positive for malaise/fatigue. Negative for chills, fever and weight loss.  HENT:  Negative for congestion, ear discharge and nosebleeds.   Eyes:  Negative for blurred vision.  Respiratory:  Positive for shortness of breath. Negative for cough, hemoptysis, sputum production and wheezing.   Cardiovascular:  Negative for chest pain, palpitations, orthopnea and claudication.  Gastrointestinal:  Negative for abdominal pain, blood in stool, constipation, diarrhea, heartburn, melena, nausea and vomiting.  Genitourinary:  Negative for dysuria, flank pain, frequency, hematuria and urgency.  Musculoskeletal:  Negative for back  pain, joint pain and myalgias.  Skin:  Negative for rash.  Neurological:   Negative for dizziness, tingling, focal weakness, seizures, weakness and headaches.  Endo/Heme/Allergies:  Does not bruise/bleed easily.  Psychiatric/Behavioral:  Negative for depression and suicidal ideas. The patient does not have insomnia.     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    Vitamin D deficiency      Past Surgical History:  Procedure Laterality Date   ABDOMINAL HYSTERECTOMY  1995   APPENDECTOMY  1994   BREAST BIOPSY Right 1998   neg   BREAST BIOPSY Left 05/30/2020   stere bx x clip path pending   BREAST LUMPECTOMY WITH SENTINEL LYMPH NODE BIOPSY Left 06/29/2020   Procedure: BREAST LUMPECTOMY WITH SENTINEL LYMPH NODE BX;  Surgeon: Robert Bellow, MD;  Location: [facility] ORS;  Service: General;  Laterality: Left;   ESI     LUMBAR MICRODISCECTOMY  2019   L3-4   LUMBAR MICRODISCECTOMY  2008   L4-L5 repeated   MICRODISCECTOMY LUMBAR  2008   L4-5   MICRODISCECTOMY LUMBAR  2010   L4-L5 repeated   NODE DISSECTION Left 08/13/2020   Procedure: AXILLARY NODE DISSECTION;  Surgeon: Robert Bellow, MD;  Location: [facility] ORS;  Service: General;  Laterality: Left;   PORTACATH PLACEMENT Right 09/05/2020   Procedure: INSERTION PORT-A-CATH;  Surgeon: Robert Bellow, MD;  Location: [facility] ORS;  Service: General;  Laterality: Right;  right; monitor anesthesia care    SQUAMOUS CELL CARCINOMA EXCISION     TONSILLECTOMY  1971    Social History   Socioeconomic History   Marital status: Widowed    Spouse name: Not on file   Number of children: 1   Years of education: Not on file   Highest education level: Bachelor's degree (e.g., BA, AB, BS)  Occupational History   Occupation: retired  Tobacco Use   Smoking status: Former    Packs/day: 0.91    Years: 33.00    Pack years: 30.03    Types: Cigarettes    Quit date: 04/19/2018    Years since quitting: 2.8   Smokeless tobacco: Never  Vaping Use   Vaping Use: Never used  Substance and Sexual Activity   Alcohol use: Yes    Comment: rarely   Drug use: No   Sexual activity: Not Currently  Other Topics Concern   Not on file  Social History Narrative   Not on file   Social Determinants of Health   Financial Resource Strain: Not on file  Food Insecurity: Not on file  Transportation Needs: Not on file  Physical Activity: Not on file  Stress: Not on file  Social Connections:  Not on file  Intimate Partner Violence: Not on file    Family History  Problem Relation Age of Onset   Breast cancer Mother 20   Osteosarcoma Mother        d. 2   Hypertension Brother    COPD Father    Hypertension Father    Breast cancer Paternal Aunt        dx 78s   Breast cancer Paternal Grandmother        dx 65s   Stomach cancer Paternal Grandfather        dx 18s   Breast cancer Paternal Aunt        dx 60s   Brain cancer Paternal Aunt        dx 59s     Current Outpatient Medications:    acetaminophen (TYLENOL) 650 MG CR tablet, Take 1,300 mg by mouth every 8 (eight) hours as needed for pain., Disp: , Rfl:    albuterol (VENTOLIN HFA) 108 (90 Base) MCG/ACT inhaler, Inhale 2 puffs into the lungs every 6 (six) hours as needed for wheezing or shortness of breath., Disp: 8 g, Rfl: 2   Biotin 1000 MCG tablet, Take 1,000 mcg by mouth daily., Disp: , Rfl:    cholecalciferol (VITAMIN D) 25 MCG (1000 UNIT)  tablet, Take 1,000 Units by mouth daily., Disp: , Rfl:    diazepam (VALIUM) 5 MG tablet, Take 1 tablet (5 mg total) by mouth every 12 (twelve) hours as needed for anxiety., Disp: 60 tablet, Rfl: 5   Fluticasone-Umeclidin-Vilant (TRELEGY ELLIPTA) 100-62.5-25 MCG/ACT AEPB, Inhale 100 mcg into the lungs daily., Disp: 28 each, Rfl: 0   hydrocortisone 2.5 % lotion, Apply 1 application topically daily as needed (when washing face)., Disp: , Rfl:    letrozole (FEMARA) 2.5 MG tablet, Take 1 tablet (2.5 mg total) by mouth daily., Disp: 30 tablet, Rfl: 3   lidocaine-prilocaine (EMLA) cream, Apply to affected area once, Disp: 30 g, Rfl: 3   losartan (COZAAR) 100 MG tablet, TAKE 1 TABLET (100 MG TOTAL) BY MOUTH DAILY., Disp: 90 tablet, Rfl: 1   Menthol, Topical Analgesic, (BIOFREEZE EX), Apply 1 application topically daily as needed (back pain)., Disp: , Rfl:    mometasone (ELOCON) 0.1 % cream, Apply 1 application topically daily as needed (ear irritation)., Disp: , Rfl:    Polyethyl Glycol-Propyl Glycol (SYSTANE OP), Place 1 drop into both eyes daily as needed (dry eyes)., Disp: , Rfl:    sucralfate (CARAFATE) 1 g tablet, Take 1 tablet (1 g total) by mouth 3 (three) times daily. Dissolve in 3-4 tbsp warm water, swish and swallow., Disp: 90 tablet, Rfl: 1   triamterene-hydrochlorothiazide (DYAZIDE) 37.5-25 MG capsule, Take 1 each (1 capsule total) by mouth daily., Disp: 90 capsule, Rfl: 4   venlafaxine XR (EFFEXOR-XR) 75 MG 24 hr capsule, TAKE 3 CAPSULES (225 MG TOTAL) BY MOUTH DAILY WITH BREAKFAST., Disp: 270 capsule, Rfl: 1  Physical exam:  Vitals:   02/26/21 1010  BP: 139/77  Pulse: 90  Resp: 18  Temp: 98.9 F (37.2 C)  TempSrc: Tympanic  SpO2: 97%  Weight: 234 lb (106.1 kg)  Height: $Remove'5\' 4"'UUXTrNm$  (1.626 m)   Physical Exam Constitutional:      General: She is not in acute distress.    Comments: Pursed lip breathing.  Ambulates with a cane  Cardiovascular:     Rate and Rhythm: Normal rate and regular  rhythm.     Heart sounds: Normal heart sounds.  Pulmonary:  Effort: Pulmonary effort is normal.     Breath sounds: Normal breath sounds.  Abdominal:     General: Bowel sounds are normal.     Palpations: Abdomen is soft.  Skin:    General: Skin is warm and dry.  Neurological:     Mental Status: She is alert and oriented to person, place, and time.     CMP Latest Ref Rng & Units 02/15/2021  Glucose 70 - 99 mg/dL 109(H)  BUN 8 - 23 mg/dL 21  Creatinine 0.44 - 1.00 mg/dL 0.83  Sodium 135 - 145 mmol/L 134(L)  Potassium 3.5 - 5.1 mmol/L 3.4(L)  Chloride 98 - 111 mmol/L 99  CO2 22 - 32 mmol/L 30  Calcium 8.9 - 10.3 mg/dL 9.0  Total Protein 6.5 - 8.1 g/dL 7.0  Total Bilirubin 0.3 - 1.2 mg/dL 0.4  Alkaline Phos 38 - 126 U/L 124  AST 15 - 41 U/L 22  ALT 0 - 44 U/L 21   CBC Latest Ref Rng & Units 02/15/2021  WBC 4.0 - 10.5 K/uL 5.1  Hemoglobin 12.0 - 15.0 g/dL 11.5(L)  Hematocrit 36.0 - 46.0 % 36.0  Platelets 150 - 400 K/uL 255    No images are attached to the encounter.  DG Bone Density  Result Date: 02/11/2021 EXAM: DUAL X-RAY ABSORPTIOMETRY (DXA) FOR BONE MINERAL DENSITY IMPRESSION: Your patient Eduardo Wurth completed a BMD test on 02/11/2021 using the [place] (software version: 14.10) manufactured by [employer]. The following summarizes the results of our evaluation. Technologist: SCE PATIENT BIOGRAPHICAL: Name: Tashala, Cumbo Patient ID: [id number] Birth Date: 21-Feb-1950 Height: 63.5 in. Gender: Female Exam Date: 02/11/2021 Weight: 232.0 lbs. Indications: Advanced Age, Caucasian, COPD, History of Breast Cancer, History of Chemo, History of Radiation, Hysterectomy, Oopherectomy Bilateral, Postmenopausal, Tobacco User Fractures: Treatments: Vitamin D DENSITOMETRY RESULTS: Site         Region     Measured Date Measured Age WHO Classification Young Adult T-score BMD         %Change vs. Previous Significant Change (*) DualFemur Neck Right 02/11/20[age].1  Osteopenia -1.1 0.880 g/cm2 -9.4% Yes DualFemur Neck Right 04/26/2014 65.3 Normal -0.5 0.971 g/cm2 - - DualFemur Total Mean 02/11/2021 72.1 Normal 0.2 1.036 g/cm2 -4.6% Yes DualFemur Total Mean 04/26/2014 65.3 Normal 0.6 1.086 g/cm2 - - Left Forearm Radius 33% 02/11/2021 72.1 Normal 0.6 0.932 g/cm2 -1.6% - Left Forearm Radius 33% 04/26/2014 65.3 Normal 0.8 0.947 g/cm2 - - ASSESSMENT: The BMD measured at Femur Neck Right is 0.880 g/cm2 with a T-score of -1.1. This patient is considered osteopenic according to [facility]) criteria. The scan quality is good. Lumbar spine was not utilized due to advanced degenerative changes. Compared with prior study, there has been a significant decrease in the total hip. World Pharmacologist [facility]) criteria for post-menopausal, Caucasian Women: Normal:                   T-score at or above -1 SD Osteopenia/low bone mass: T-score between -1 and -2.5 SD Osteoporosis:             T-score at or below -2.5 SD RECOMMENDATIONS: 1. All patients should optimize calcium and vitamin D intake. 2. Consider FDA-approved medical therapies in postmenopausal women and men aged 39 years and older, based on the following: a. A hip or vertebral(clinical or morphometric) fracture b. T-score < -2.5 at the femoral neck or spine after appropriate evaluation to exclude secondary causes c. Low  bone mass (T-score between -1.0 and -2.5 at the femoral neck or spine) and a 10-year probability of a hip fracture > 3% or a 10-year probability of a major osteoporosis-related fracture > 20% based on the [place]-adapted WHO algorithm 3. Clinician judgment and/or patient preferences may indicate treatment for people with 10-year fracture probabilities above or below these levels FOLLOW-UP: People with diagnosed cases of osteoporosis or at high risk for fracture should have regular bone mineral density tests. For patients eligible for Medicare, routine testing is allowed once every 2 years. The testing  frequency can be increased to one year for patients who have rapidly progressing disease, those who are receiving or discontinuing medical therapy to restore bone mass, or have additional risk factors. I have reviewed this report, and agree with the above findings. [facility] Dear Dr Janese Banks, Your patient NOELA BROTHERS completed a FRAX assessment on 02/11/2021 using the [place] (analysis version: 14.10) manufactured by [employer]. The following summarizes the results of our evaluation. PATIENT BIOGRAPHICAL: Name: Analisia, Kingsford Patient ID: [id number] Birth Date: April 24, 1950 Height:    63.5 in. Gender:     Female    [age]       Weight:    232.0 lbs. Ethnicity:  White                            Exam Date: 02/11/2021 FRAX* RESULTS:  (version: 3.5) 10-year Probability of Fracture1 Major Osteoporotic Fracture2 Hip Fracture 8.4% 1.0% Population: [place] (Caucasian) Risk Factors: None Based on DualFemur (Right) Neck BMD 1 -The 10-year probability of fracture may be lower than reported if the patient has received treatment. 2 -Major Osteoporotic Fracture: Clinical Spine, Forearm, Hip or Shoulder *FRAX is a Materials engineer of the [employer] for Metabolic Bone Disease, a [place] (WHO) [facility]. ASSESSMENT: The probability of a major osteoporotic fracture is 8.4% within the next ten years. The probability of a hip fracture is 1.0% within the next ten years. . Electronically Signed   By: Elmer Picker M.D.   On: 02/11/2021 10:38     Assessment and plan- Patient is a 71 y.o. 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  She is s/p 12 weekly cycles of Taxol chemotherapy and adjuvant radiation treatment and her to discuss endocrine therapy  Patient has completed adjuvant weekly Taxol chemotherapy as well as adjuvant radiation treatment.   Ki-67 on her tumor specimen was not detectable.  I will therefore hold off on considering adjuvant abemaciclib for her.  We did discuss that with ER positive disease she would benefit from endocrine therapy.  I would recommend letrozole along with calcium and vitamin D for her.  Discussed risks and benefits of letrozole including all but not limited to mood swings, hot flashes, arthralgias as well as worsening bone health.  Treatment will be given with a curative intent.  Patient understands and agrees to proceed as planned.She did have a baseline bone density scan done which showed osteopenia with a T score of -1.1 in the right femur neck.  Her 10-year probability of a major osteoporotic fracture was less than 20% and hip fracture less than 3%.  However she would qualify for adjuvant bisphosphonates given that she has high risk breast cancer.  I would recommend Zometa either  every 6 months for 3 years or every 3 months for 2 years.  Discussed risks and benefits of Zometa including all but not limited to possible risk of hypocalcemia and osteonecrosis of the jaw.  We would like to obtain dentall clearance prior to starting Zometa.  I will tentatively see her back in 6 weeks with CBC with differential and CMP and see how she is tolerating her letrozole and for possible Zometa   Visit Diagnosis 1. Malignant neoplasm of upper-outer quadrant of left breast in female, estrogen receptor positive ([place])   2. Goals of care, counseling/discussion      Dr. Randa Evens, MD, MPH [facility] at [facility] [phone] 02/26/2021 12:46 PM

## 2021-02-26 NOTE — Progress Notes (Signed)
Letter for permission from dentist to start zometa

## 2021-02-28 ENCOUNTER — Other Ambulatory Visit
Admission: RE | Admit: 2021-02-28 | Discharge: 2021-02-28 | Disposition: A | Discharge status: Home / Self Care | Payer: Medicare PPO | Source: Ambulatory Visit | Attending: Pulmonary Disease | Admitting: Pulmonary Disease

## 2021-02-28 ENCOUNTER — Other Ambulatory Visit: Payer: Self-pay

## 2021-02-28 DIAGNOSIS — Z20822 Contact with and (suspected) exposure to covid-19: Secondary | ICD-10-CM | POA: Insufficient documentation

## 2021-02-28 DIAGNOSIS — Z01812 Encounter for preprocedural laboratory examination: Secondary | ICD-10-CM | POA: Insufficient documentation

## 2021-02-28 LAB — SARS CORONAVIRUS 2 (TAT 6-24 HRS): SARS Coronavirus 2: NEGATIVE

## 2021-03-01 ENCOUNTER — Telehealth: Payer: Self-pay | Admitting: Pulmonary Disease

## 2021-03-01 ENCOUNTER — Ambulatory Visit: Payer: Medicare PPO | Attending: Pulmonary Disease

## 2021-03-01 DIAGNOSIS — R0602 Shortness of breath: Secondary | ICD-10-CM

## 2021-03-01 DIAGNOSIS — J42 Unspecified chronic bronchitis: Secondary | ICD-10-CM

## 2021-03-01 NOTE — Telephone Encounter (Signed)
Unfortunately Victoria Dean would actually alter the results of the test because she would not give maximum effort.  The test requested was actually the abbreviated version even not with the body box.  If she cannot tolerate that unfortunately there is little else that I can do except just treat her empirically without knowing exactly how bad her lungs actually are.

## 2021-03-01 NOTE — Telephone Encounter (Signed)
Patient was not able to do PFT today due to having a panic attack. She has called the office to see if she still needs to keep the appt with Dr. Patsey Berthold on 04/04/21. She can try the PFT again with valium but it still may not work

## 2021-03-04 NOTE — Telephone Encounter (Signed)
Patient is aware of below message and voiced her understanding.  She would like to discuss this further at upcoming visit.  Nothing further needed at this time.

## 2021-03-07 ENCOUNTER — Telehealth: Payer: Self-pay | Admitting: *Deleted

## 2021-03-07 NOTE — Telephone Encounter (Signed)
Patient called stating that she saw her dentist today and he asked her if she had any paperwork for him to complete for [place]. She is asking if we need something in writing for him to report his findings and if we have a form for him to complete or if we will just take a call form his office about it. Please call her back and let her know if she needs to come by to pick up a form she states she goes back to see him Tuesday

## 2021-03-07 NOTE — Telephone Encounter (Signed)
I called the pt and got her voicemail. I sent the paper to fill out to dentist 1/24. I called again today and office said that in [name]. The fax was not working good, so they did not get it. I have a paper that says it went through. I have faxed it again and I will check with pt once I get the letter.

## 2021-03-07 NOTE — Telephone Encounter (Signed)
I called dentist back after my scan said it went through and staff at dentist states that they have it now and asked dentist to review and sign and send back

## 2021-03-08 ENCOUNTER — Encounter (HOSPITAL_COMMUNITY): Payer: Self-pay

## 2021-03-08 ENCOUNTER — Telehealth: Payer: Self-pay | Admitting: *Deleted

## 2021-03-08 ENCOUNTER — Encounter: Payer: Self-pay | Admitting: Oncology

## 2021-03-08 NOTE — Telephone Encounter (Signed)
Called pt to let her know that I did receive the approval for the biphosphonate drug and she has an appt 3/7 if she wants to get it. She is thinking about it but leaning toward not having it. She will keep thinking and let us know in march

## 2021-03-11 DIAGNOSIS — E113393 Type 2 diabetes mellitus with moderate nonproliferative diabetic retinopathy without macular edema, bilateral: Secondary | ICD-10-CM | POA: Diagnosis not present

## 2021-03-23 ENCOUNTER — Other Ambulatory Visit: Payer: Self-pay | Admitting: Family Medicine

## 2021-03-23 DIAGNOSIS — I10 Essential (primary) hypertension: Secondary | ICD-10-CM

## 2021-03-23 NOTE — Telephone Encounter (Signed)
Requested Prescriptions  Pending Prescriptions Disp Refills   triamterene-hydrochlorothiazide (DYAZIDE) 37.5-25 MG capsule [Pharmacy Med Name: TRIAMTERENE/HYDROCHLOROTHIAZIDE 37.5-25 MG Capsule] 90 capsule 1    Sig: TAKE 1 CAPSULE EVERY DAY     Cardiovascular: Diuretic Combos Failed - 03/23/2021  3:16 AM      Failed - K in normal range and within 180 days    Potassium  Date Value Ref Range Status  02/15/2021 3.4 (L) 3.5 - 5.1 mmol/L Final         Failed - Na in normal range and within 180 days    Sodium  Date Value Ref Range Status  02/15/2021 134 (L) 135 - 145 mmol/L Final  01/30/2021 143 134 - 144 mmol/L Final         Passed - Cr in normal range and within 180 days    Creatinine, Ser  Date Value Ref Range Status  02/15/2021 0.83 0.44 - 1.00 mg/dL Final         Passed - Last BP in normal range    BP Readings from Last 1 Encounters:  02/26/21 139/77         Passed - Valid encounter within last 6 months    Recent Outpatient Visits          1 month ago Dyspnea on exertion   [facility] Birdie Sons, MD   2 months ago Upper respiratory infection with cough and congestion   [employer], Erin E, PA-C   3 months ago Mild episode of recurrent major depressive disorder [facility])   [place] Jerrol Banana., MD   6 months ago Malignant neoplasm of upper-outer quadrant of left breast in female, estrogen receptor positive [facility])   [facility] Jerrol Banana., MD   9 months ago Malignant neoplasm of upper-outer quadrant of left breast in female, estrogen receptor positive [facility])   [facility] Jerrol Banana., MD      Future Appointments            In 2 months Jerrol Banana., MD [facility] [place]

## 2021-03-26 DIAGNOSIS — D0439 Carcinoma in situ of skin of other parts of face: Secondary | ICD-10-CM | POA: Diagnosis not present

## 2021-03-26 DIAGNOSIS — D2261 Melanocytic nevi of right upper limb, including shoulder: Secondary | ICD-10-CM | POA: Diagnosis not present

## 2021-03-26 DIAGNOSIS — R238 Other skin changes: Secondary | ICD-10-CM | POA: Diagnosis not present

## 2021-03-26 DIAGNOSIS — L538 Other specified erythematous conditions: Secondary | ICD-10-CM | POA: Diagnosis not present

## 2021-03-26 DIAGNOSIS — D485 Neoplasm of uncertain behavior of skin: Secondary | ICD-10-CM | POA: Diagnosis not present

## 2021-03-26 DIAGNOSIS — Z85828 Personal history of other malignant neoplasm of skin: Secondary | ICD-10-CM | POA: Diagnosis not present

## 2021-03-26 DIAGNOSIS — L309 Dermatitis, unspecified: Secondary | ICD-10-CM | POA: Diagnosis not present

## 2021-03-26 DIAGNOSIS — L820 Inflamed seborrheic keratosis: Secondary | ICD-10-CM | POA: Diagnosis not present

## 2021-03-26 DIAGNOSIS — D225 Melanocytic nevi of trunk: Secondary | ICD-10-CM | POA: Diagnosis not present

## 2021-03-26 DIAGNOSIS — B078 Other viral warts: Secondary | ICD-10-CM | POA: Diagnosis not present

## 2021-03-29 ENCOUNTER — Encounter: Payer: Self-pay | Admitting: Radiation Oncology

## 2021-03-29 ENCOUNTER — Other Ambulatory Visit: Payer: Self-pay

## 2021-03-29 ENCOUNTER — Ambulatory Visit
Admission: RE | Admit: 2021-03-29 | Discharge: 2021-03-29 | Disposition: A | Discharge status: Home / Self Care | Payer: Medicare PPO | Source: Ambulatory Visit | Attending: Radiation Oncology | Admitting: Radiation Oncology

## 2021-03-29 VITALS — BP 109/68 | HR 90 | Temp 97.0°F | Resp 22 | Wt 240.3 lb

## 2021-03-29 DIAGNOSIS — Z170 Estrogen receptor positive status [ER+]: Secondary | ICD-10-CM | POA: Diagnosis not present

## 2021-03-29 DIAGNOSIS — C50412 Malignant neoplasm of upper-outer quadrant of left female breast: Secondary | ICD-10-CM | POA: Insufficient documentation

## 2021-03-29 DIAGNOSIS — Z923 Personal history of irradiation: Secondary | ICD-10-CM | POA: Diagnosis not present

## 2021-03-29 DIAGNOSIS — Z79811 Long term (current) use of aromatase inhibitors: Secondary | ICD-10-CM | POA: Insufficient documentation

## 2021-03-29 NOTE — Progress Notes (Signed)
Survivorship Care Plan visit completed.  Treatment summary reviewed and given to patient.  ASCO answers booklet reviewed and given to patient.  CARE program and Cancer Transitions discussed with patient along with other resources cancer center offers to patients and caregivers.  Patient verbalized understanding.    

## 2021-03-29 NOTE — Progress Notes (Signed)
Radiation Oncology Follow up Note  Name: Victoria Dean   Date:   03/29/2021 MRN:  [id number] DOB: December 06, 1950    This 71 y.o. female presents to the clinic today for 1 month follow-up status post whole breast radiation to her left breast for stage IIIa (T3 N1 M0) ER/PR positive invasive lobular carcinoma.  REFERRING PROVIDER: Jerrol Banana.,*  HPI: Patient is a 71 year old female now at 1 month having completed whole breast and peripheral lymphatic radiation therapy to her left breast for stage IIIa invasive lobular carcinoma.  Seen today in routine follow-up she is doing well.  She specifically denies breast tenderness cough or bone pain..  She has started letrozole tolerating that well without side effect recently started dermatologist had some lesions treated.  COMPLICATIONS OF TREATMENT: none  FOLLOW UP COMPLIANCE: keeps appointments   PHYSICAL EXAM:  BP 109/68 (BP Location: Right Arm, Patient Position: Sitting)    Pulse 90    Temp (!) 97 F (36.1 C) (Tympanic)    Resp (!) 22    Wt 240 lb 4.8 oz (109 kg)    BMI 41.25 kg/m  Lungs are clear to A&P cardiac examination essentially unremarkable with regular rate and rhythm. No dominant mass or nodularity is noted in either breast in 2 positions examined. Incision is well-healed. No axillary or supraclavicular adenopathy is appreciated. Cosmetic result is excellent.  Well-developed well-nourished patient in NAD. HEENT reveals PERLA, EOMI, discs not visualized.  Oral cavity is clear. No oral mucosal lesions are identified. Neck is clear without evidence of cervical or supraclavicular adenopathy. Lungs are clear to A&P. Cardiac examination is essentially unremarkable with regular rate and rhythm without murmur rub or thrill. Abdomen is benign with no organomegaly or masses noted. Motor sensory and DTR levels are equal and symmetric in the upper and lower extremities. Cranial nerves II through XII are grossly intact. Proprioception is intact.  No peripheral adenopathy or edema is identified. No motor or sensory levels are noted. Crude visual fields are within normal range.  RADIOLOGY RESULTS: No current films for review  PLAN: Present time patient is doing well 1 month out from whole breast radiation and pleased with her overall progress.  Of asked to see her back in 4 to 5 months for follow-up.  She continues on letrozole without side effect.  Patient is to call with any concerns.  I would like to take this opportunity to thank you for allowing me to participate in the care of your patient.Noreene Filbert, MD

## 2021-04-02 ENCOUNTER — Other Ambulatory Visit: Payer: Self-pay | Admitting: Oncology

## 2021-04-04 ENCOUNTER — Encounter: Payer: Self-pay | Admitting: Pulmonary Disease

## 2021-04-04 ENCOUNTER — Ambulatory Visit: Payer: Medicare PPO | Admitting: Pulmonary Disease

## 2021-04-04 ENCOUNTER — Other Ambulatory Visit: Payer: Self-pay

## 2021-04-04 VITALS — BP 118/70 | HR 104 | Temp 98.3°F | Ht 63.5 in | Wt 240.0 lb

## 2021-04-04 DIAGNOSIS — R000 Tachycardia, unspecified: Secondary | ICD-10-CM | POA: Diagnosis not present

## 2021-04-04 DIAGNOSIS — R0602 Shortness of breath: Secondary | ICD-10-CM

## 2021-04-04 DIAGNOSIS — J42 Unspecified chronic bronchitis: Secondary | ICD-10-CM | POA: Diagnosis not present

## 2021-04-04 MED ORDER — SPIRIVA RESPIMAT 2.5 MCG/ACT IN AERS: 2.0000 | INHALATION_SPRAY | Freq: Every day | RESPIRATORY_TRACT | 0 refills | Status: DC

## 2021-04-04 NOTE — Progress Notes (Signed)
Subjective:    Patient ID: Victoria Dean, female    DOB: 1951/01/27, 71 y.o.   MRN: [id number] Patient Care Team: Ludwig Clarks, FNP as PCP - General (Family Medicine) Lorelee Cover., MD as Consulting Physician (Ophthalmology) Dimmig, Marcello Moores, MD as Referring Physician (Orthopedic Surgery) Oneta Rack, MD (Dermatology) Dasher, Rayvon Char, MD (Dermatology) Sindy Guadeloupe, MD as Consulting Physician (Hematology and Oncology) Noreene Filbert, MD as Consulting Physician (Radiation Oncology) Bary Castilla [name], MD as Consulting Physician (General Surgery)  Chief Complaint  Patient presents with   Follow-up    HPI Patient is a 71 year old former smoker (quit 2018, 16 PY) presents for follow-up of dyspnea with any activity/exertion.  I initially saw the patient on 19 February 2021 this is a follow-up visit.  She states she started noticing dyspnea approximately September 08, 2020 and was more pronounced and noticeable in Feb 08, 2021.  She states that when she develops dyspnea on exertion sitting still and long slow breaths help her.  She was given a trial of Trelegy Ellipta and as needed albuterol during her last visit however she did not tolerate this due to worsening of her tachycardia which is a chronic problem for her.  She was unable to perform the PFTs requested at her last visit due to being claustrophobic and not being able to get into the PFT body box.  She has not had any recent fevers, chills or sweats since her last visit here.  A CT chest performed on 19 January 2021 showed no evidence of pulmonary emboli, there was bronchial wall thickening consistent with underlying bronchitis.    She does not endorse any chest pain, paroxysmal nocturnal dyspnea nor orthopnea.  No lower extremity edema or calf tenderness. She does not endorse any gastroesophageal reflux symptoms.     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  Underwent whole breast  peripheral lymphatic radiation completed in January 2023.  She completed 12 cycles of weekly Taxol chemotherapy in October 2022.   Review of Systems A 10 point review of systems was performed and it is as noted above otherwise negative.  Patient Active Problem List   Diagnosis Date Noted   Acute on chronic respiratory failure with hypoxia ([place]) 01/19/2021   Upper respiratory infection with cough and congestion 01/17/2021   B12 deficiency 10/09/2020   Genetic testing 06/26/2020   Family history of breast cancer    Family history of stomach cancer    Malignant neoplasm of upper-outer quadrant of left breast in female, estrogen receptor positive ([place]) 06/10/2020   Goals of care, counseling/discussion 06/10/2020   Impingement syndrome of right shoulder region 05/06/2019   Low back pain 01/24/2019   Personal history of tobacco use, presenting hazards to health 02/23/2018   History of lumbar laminectomy 04/30/2016   Actinic keratosis 11/21/2014   Adaptation reaction 11/21/2014   Allergic rhinitis 11/21/2014   Cornu cutaneum 11/21/2014   Clinical depression 11/21/2014   Dermatitis, eczematoid 11/21/2014   Enthesopathy 11/21/2014   Essential (primary) hypertension 11/21/2014   Anxiety, generalized 11/21/2014   Herniated nucleus pulposus 11/21/2014   Mild episode of recurrent major depressive disorder ([place]) 11/21/2014   Adiposity 11/21/2014   Arthritis, degenerative 11/21/2014   Basal cell papilloma 11/21/2014   Apnea, sleep 11/21/2014   Avitaminosis D 11/21/2014   Social History   Tobacco Use   Smoking status: Former    Packs/day: 0.91    Years: 33.00  Pack years: 30.03    Types: Cigarettes    Quit date: 04/19/2018    Years since quitting: 2.9   Smokeless tobacco: Never  Substance Use Topics   Alcohol use: Yes    Comment: rarely   Allergies  Allergen Reactions   Other Itching    Brand name bandaids with cloth backing causes redness and itching   Statins     Muscle  weakness   Zetia [Ezetimibe]     Leg pain, upset stomach   Sulfa Antibiotics Rash   Current Meds  Medication Sig   acetaminophen (TYLENOL) 650 MG CR tablet Take 1,300 mg by mouth every 8 (eight) hours as needed for pain.   Biotin 1000 MCG tablet Take 1,000 mcg by mouth daily.   Calcium Carbonate-Vit D-Min (CALCIUM 1200 PO) Take by mouth.   cholecalciferol (VITAMIN D) 25 MCG (1000 UNIT) tablet Take 1,000 Units by mouth daily.   diazepam (VALIUM) 5 MG tablet Take 1 tablet (5 mg total) by mouth every 12 (twelve) hours as needed for anxiety.   hydrocortisone 2.5 % lotion Apply 1 application topically daily as needed (when washing face).   letrozole (FEMARA) 2.5 MG tablet Take 1 tablet (2.5 mg total) by mouth daily.   lidocaine-prilocaine (EMLA) cream Apply to affected area once   losartan (COZAAR) 100 MG tablet TAKE 1 TABLET (100 MG TOTAL) BY MOUTH DAILY.   Menthol, Topical Analgesic, (BIOFREEZE EX) Apply 1 application topically daily as needed (back pain).   mometasone (ELOCON) 0.1 % cream Apply 1 application topically daily as needed (ear irritation).   Polyethyl Glycol-Propyl Glycol (SYSTANE OP) Place 1 drop into both eyes daily as needed (dry eyes).   rosuvastatin (CRESTOR) 5 MG tablet Take by mouth.   triamterene-hydrochlorothiazide (DYAZIDE) 37.5-25 MG capsule TAKE 1 CAPSULE EVERY DAY   venlafaxine XR (EFFEXOR-XR) 75 MG 24 hr capsule TAKE 3 CAPSULES (225 MG TOTAL) BY MOUTH DAILY WITH BREAKFAST.   [DISCONTINUED] albuterol (VENTOLIN HFA) 108 (90 Base) MCG/ACT inhaler Inhale 2 puffs into the lungs every 6 (six) hours as needed for wheezing or shortness of breath.   [DISCONTINUED] Fluticasone-Umeclidin-Vilant (TRELEGY ELLIPTA) 100-62.5-25 MCG/ACT AEPB Inhale 100 mcg into the lungs daily.   Immunization History  Administered Date(s) Administered   Fluad Quad(high Dose 65+) 09/27/2018, 10/17/2019, 11/29/2020   Hepatitis B 06/12/1995, 07/15/1995, 12/15/1995   Influenza Split 01/01/2011,  11/24/2012   Influenza, High Dose Seasonal PF 11/22/2014, 10/23/2015, 11/12/2016, 12/08/2017   Influenza,inj,Quad PF,6+ Mos 11/13/2013   Influenza,inj,quad, With Preservative 10/05/2015   Pneumococcal Conjugate-13 10/11/2014   Pneumococcal Polysaccharide-23 11/08/2008, 01/24/2016   Pneumococcal-Unspecified 01/04/2015   Td 06/21/2004   Tdap 03/23/2014   Zoster, Live 11/13/2013       Objective:   Physical Exam BP 118/70 (BP Location: Left Arm, Patient Position: Sitting, Cuff Size: Normal)   Pulse (!) 104   Temp 98.3 F (36.8 C) (Oral)   Ht 5' 3.5" (1.613 m)   Wt 240 lb (108.9 kg)   SpO2 93%   BMI 41.85 kg/m   GENERAL: Obese woman, no acute distress, ambulatory with assistance assistance of a cane HEAD: Normocephalic, atraumatic.  EYES: Pupils equal, round, reactive to light.  No scleral icterus.  MOUTH: Nose/mouth/throat not examined due to masking requirements for COVID 19. NECK: Supple. No thyromegaly. Trachea midline. No JVD.  No adenopathy. PULMONARY: Good air entry bilaterally.  Coarse, otherwise, no adventitious sounds. CARDIOVASCULAR: S1 and S2. Regular rate and rhythm.  No rubs, murmurs or gallops heard. ABDOMEN: Obese, otherwise benign.  MUSCULOSKELETAL: No joint deformity, no clubbing, no edema.  NEUROLOGIC: Walks with assistance of a cane.  Otherwise nonfocal. SKIN: Intact,warm,dry. PSYCH: Somewhat anxious mood, normal behavior.       Assessment & Plan:     ICD-10-CM   1. Shortness of breath  R06.02 Pulse oximetry, overnight    CANCELED: Pulse oximetry, overnight   Multifactorial: Chronic bronchitis/obesity/deconditioning Spirometry when able Overnight pulse oximetry    2. Chronic bronchitis, unspecified chronic bronchitis type ([place])  J42    Could not do PFTs due to claustrophobia Patient will need to bring COVID vaccination card Will do office spirometry on follow-up Trial of Spiriva    3. Tachycardia  R00.0    Chronic issue for the patient SABA/LABA  worsens     Orders Placed This Encounter  Procedures   Pulse oximetry, overnight    ON RA    Standing Status:   Future    Standing Expiration Date:   04/05/2022    Scheduling Instructions:     [facility]   Meds ordered this encounter  Medications   Tiotropium Bromide Monohydrate (SPIRIVA RESPIMAT) 2.5 MCG/ACT AERS    Sig: Inhale 2 puffs into the lungs daily.    Dispense:  4 g    Refill:  0    Order Specific Question:   Lot Number?    Answer:   [id number]    Order Specific Question:   Expiration Date?    Answer:   11/02/2021    Order Specific Question:   [place]    Answer:   [id number]    Order Specific Question:   Quantity    Answer:   2   Patient has not been able to perform PFTs due to having issues with claustrophobia.  She would not even consider spirometry testing in the lab without the body box.  We can do office spirometry but the patient has to bring documentation of her COVID vaccination.  She had difficulties with Trelegy and albuterol due to worsening of her baseline tachycardia.  Will give her a trial of Spiriva 2 inhalations daily to see if this helps her issues with shortness of breath.  We will see her in follow-up in 2 months time she is to contact us prior to that time should any new difficulties arise.  Renold Don, MD Advanced Bronchoscopy PCCM [facility]    *This note was dictated using voice recognition software/Dragon.  Despite best efforts to proofread, errors can occur which can change the meaning. Any transcriptional errors that result from this process are unintentional and may not be fully corrected at the time of dictation.

## 2021-04-04 NOTE — Patient Instructions (Signed)
See if you can find your COVID vaccination card, bring it to the next appointment so we can do a spirometry at that time. ? ?We are going to see you in follow-up in 2 months time. ? ? ?We are checking an oxygen level at nighttime.  We will let you know the results of this as soon as we get it. ? ? ?We are a trial of an inhaler called Spiriva, this should not affect your heart rate.  Let us know if this helps with your breathing. ? ? ? ?

## 2021-04-09 ENCOUNTER — Other Ambulatory Visit: Payer: Self-pay

## 2021-04-09 ENCOUNTER — Inpatient Hospital Stay: Payer: Medicare PPO

## 2021-04-09 ENCOUNTER — Encounter: Payer: Self-pay | Admitting: Oncology

## 2021-04-09 ENCOUNTER — Inpatient Hospital Stay: Payer: Medicare PPO | Admitting: Oncology

## 2021-04-09 ENCOUNTER — Other Ambulatory Visit: Payer: Self-pay | Admitting: *Deleted

## 2021-04-09 ENCOUNTER — Inpatient Hospital Stay: Payer: Medicare PPO | Attending: Radiation Oncology

## 2021-04-09 VITALS — BP 117/66 | HR 112 | Temp 97.2°F | Resp 19 | Wt 240.3 lb

## 2021-04-09 DIAGNOSIS — C50412 Malignant neoplasm of upper-outer quadrant of left female breast: Secondary | ICD-10-CM | POA: Diagnosis not present

## 2021-04-09 DIAGNOSIS — Z170 Estrogen receptor positive status [ER+]: Secondary | ICD-10-CM | POA: Diagnosis not present

## 2021-04-09 DIAGNOSIS — E538 Deficiency of other specified B group vitamins: Secondary | ICD-10-CM

## 2021-04-09 DIAGNOSIS — Z7983 Long term (current) use of bisphosphonates: Secondary | ICD-10-CM

## 2021-04-09 DIAGNOSIS — Z79811 Long term (current) use of aromatase inhibitors: Secondary | ICD-10-CM

## 2021-04-09 DIAGNOSIS — Z5181 Encounter for therapeutic drug level monitoring: Secondary | ICD-10-CM | POA: Diagnosis not present

## 2021-04-09 LAB — CBC WITH DIFFERENTIAL/PLATELET
Abs Immature Granulocytes: 0.02 10*3/uL (ref 0.00–0.07)
Basophils Absolute: 0 10*3/uL (ref 0.0–0.1)
Basophils Relative: 1 %
Eosinophils Absolute: 0.1 10*3/uL (ref 0.0–0.5)
Eosinophils Relative: 2 %
HCT: 36.5 % (ref 36.0–46.0)
Hemoglobin: 12 g/dL (ref 12.0–15.0)
Immature Granulocytes: 0 %
Lymphocytes Relative: 30 %
Lymphs Abs: 2.1 10*3/uL (ref 0.7–4.0)
MCH: 29.7 pg (ref 26.0–34.0)
MCHC: 32.9 g/dL (ref 30.0–36.0)
MCV: 90.3 fL (ref 80.0–100.0)
Monocytes Absolute: 0.7 10*3/uL (ref 0.1–1.0)
Monocytes Relative: 11 %
Neutro Abs: 3.8 10*3/uL (ref 1.7–7.7)
Neutrophils Relative %: 56 %
Platelets: 271 10*3/uL (ref 150–400)
RBC: 4.04 MIL/uL (ref 3.87–5.11)
RDW: 15 % (ref 11.5–15.5)
WBC: 6.8 10*3/uL (ref 4.0–10.5)
nRBC: 0 % (ref 0.0–0.2)

## 2021-04-09 LAB — COMPREHENSIVE METABOLIC PANEL WITH GFR
ALT: 30 U/L (ref 0–44)
AST: 29 U/L (ref 15–41)
Albumin: 3.9 g/dL (ref 3.5–5.0)
Alkaline Phosphatase: 126 U/L (ref 38–126)
Anion gap: 9 (ref 5–15)
BUN: 24 mg/dL — ABNORMAL HIGH (ref 8–23)
CO2: 28 mmol/L (ref 22–32)
Calcium: 9.5 mg/dL (ref 8.9–10.3)
Chloride: 101 mmol/L (ref 98–111)
Creatinine, Ser: 1.02 mg/dL — ABNORMAL HIGH (ref 0.44–1.00)
GFR, Estimated: 58 mL/min — ABNORMAL LOW
Glucose, Bld: 128 mg/dL — ABNORMAL HIGH (ref 70–99)
Potassium: 3.4 mmol/L — ABNORMAL LOW (ref 3.5–5.1)
Sodium: 138 mmol/L (ref 135–145)
Total Bilirubin: 0.3 mg/dL (ref 0.3–1.2)
Total Protein: 7 g/dL (ref 6.5–8.1)

## 2021-04-09 MED ORDER — HEPARIN SOD (PORK) LOCK FLUSH 100 UNIT/ML IV SOLN
500.0000 [IU] | Freq: Once | INTRAVENOUS | Status: AC | PRN
  Filled 2021-04-09: qty 5

## 2021-04-09 MED ORDER — SODIUM CHLORIDE 0.9 % IV SOLN
Freq: Once | INTRAVENOUS | Status: AC
  Filled 2021-04-09: qty 250

## 2021-04-09 MED ORDER — ZOLEDRONIC ACID 4 MG/100ML IV SOLN
4.0000 mg | Freq: Once | INTRAVENOUS | Status: AC
  Administered 2021-04-09: 4 mg via INTRAVENOUS
  Filled 2021-04-09: qty 100

## 2021-04-09 MED ORDER — "SYRINGE 25G X 1"" 3 ML MISC": 1.0000 | 0 refills | Status: DC

## 2021-04-09 MED ORDER — HEPARIN SOD (PORK) LOCK FLUSH 100 UNIT/ML IV SOLN
INTRAVENOUS | Status: AC
  Administered 2021-04-09: 500 [IU]
  Filled 2021-04-09: qty 5

## 2021-04-09 MED ORDER — CYANOCOBALAMIN 1000 MCG/ML IJ SOLN
1000.0000 ug | INTRAMUSCULAR | 11 refills | Status: DC
Start: 2021-04-09 — End: 2021-12-24

## 2021-04-09 NOTE — Patient Instructions (Signed)

## 2021-04-09 NOTE — Progress Notes (Signed)
Hematology/Oncology Consult note [facility]  Telephone:([phone] [phone]  Patient Care Team: Jerrol Banana., MD as PCP - General (Family Medicine) Lorelee Cover., MD as Consulting Physician (Ophthalmology) Dimmig, Marcello Moores, MD as Referring Physician (Orthopedic Surgery) Oneta Rack, MD (Dermatology) Dasher, Rayvon Char, MD (Dermatology) Sindy Guadeloupe, MD as Consulting Physician (Hematology and Oncology) Noreene Filbert, MD as Consulting Physician (Radiation Oncology) Bary Castilla [name], MD as Consulting Physician (General Surgery)   Name of the patient: Victoria Dean  [id number]  [birth year]   Date of visit: 04/09/21  Diagnosis- pathological prognostic stage Ib invasive lobular carcinoma of the left breast [id number] ER/PR positive HER2 negative    Chief complaint/ Reason for visit-routine follow-up of breast cancer and to receive Zometa  Heme/Onc history: Patient is a 71 year old female with a past medical history significant for claustrophobia, hypertension hypercholesterolemia, depression among other medical problems.  Patient began to notice retraction of her left nipple.  This was followed by a diagnostic mammogram which showed vague shadowing in the upper outer quadrant of the left breast without a definitive mass.  Benign cysts in the lower left breast measuring 1.5 cm.  No evidence of malignancy in the right breast.  No evidence of abnormal left axillary lymph nodes.  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   Bilateral breast MRI showed 7 x 6 x 4 cm area of non-mass enhancement in the upper outer left breast at 1 o'clock position.  This involves anterior and middle third of the breast and extension into the posterior third.  2 inferior left  axillary lymph nodes with mild cortical thickening.   PET CT scan showed 1.7 cm hypermetabolic nodule in the midline of the neck which could represent thyroglossal cyst but malignancy cannot be excluded. Patient underwent left-sided lumpectomy with nipple excision.     Final pathology showed invasive lobular carcinoma grade 210.5 cm with associated DCIS.  Skin/nipple are present and involved.  Invasive carcinoma invades the nipple without skin ulceration.  Lymphovascular invasion present.  Margins negative.  3 out of 4 lymph nodes positive for malignancy 11 mm largest deposit with extranodal extension.  pT3 pN1 a cM0   Patient was taken back for axillary dissection given 3 lymph nodes positive with extranodal extension and 4 more lymph nodes out of 9 were positive with extranodal extension   Given cardiovascular comorbidities and patient age none anthracycline adjuvant chemotherapy options were discussed.  Patient did not wish to proceed with TC chemotherapy.  She completed 12 cycles of weekly Taxol chemotherapy in October 2022 followed by adjuvant radiation therapy.  Interval history-tolerating letrozole well without any significant side effects.  She has baseline exertional shortness of breath which is overall stable.  ECOG PS- 2 Pain scale- 0   Review of systems- Review of Systems  Constitutional:  Positive for malaise/fatigue. Negative for chills, fever and weight loss.  HENT:  Negative for congestion, ear discharge and nosebleeds.   Eyes:  Negative for blurred vision.  Respiratory:  Negative for cough, hemoptysis, sputum production, shortness of breath and wheezing.   Cardiovascular:  Negative for chest pain, palpitations, orthopnea and claudication.  Gastrointestinal:  Negative for abdominal pain, blood in stool, constipation, diarrhea, heartburn, melena, nausea and vomiting.  Genitourinary:  Negative for dysuria, flank pain, frequency, hematuria and  urgency.  Musculoskeletal:   Negative for back pain, joint pain and myalgias.  Skin:  Negative for rash.  Neurological:  Negative for dizziness, tingling, focal weakness, seizures, weakness and headaches.  Endo/Heme/Allergies:  Does not bruise/bleed easily.  Psychiatric/Behavioral:  Negative for depression and suicidal ideas. The patient does not have insomnia.       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    Vitamin D deficiency      Past Surgical History:  Procedure Laterality Date   ABDOMINAL HYSTERECTOMY  1995   APPENDECTOMY  1994   BREAST BIOPSY Right 1998   neg   BREAST BIOPSY Left 05/30/2020   stere bx x clip path pending   BREAST LUMPECTOMY WITH SENTINEL LYMPH NODE BIOPSY Left 06/29/2020   Procedure: BREAST LUMPECTOMY WITH SENTINEL LYMPH NODE BX;  Surgeon: Robert Bellow, MD;  Location: [facility] ORS;  Service: General;  Laterality: Left;   ESI     LUMBAR MICRODISCECTOMY  2019   L3-4   LUMBAR MICRODISCECTOMY  2008   L4-L5 repeated   MICRODISCECTOMY LUMBAR  2008   L4-5   MICRODISCECTOMY LUMBAR  2010   L4-L5 repeated   NODE DISSECTION Left 08/13/2020   Procedure: AXILLARY NODE DISSECTION;  Surgeon: Robert Bellow, MD;  Location: [facility] ORS;  Service: General;  Laterality: Left;   PORTACATH PLACEMENT Right 09/05/2020   Procedure: INSERTION PORT-A-CATH;  Surgeon: Robert Bellow,  MD;  Location: [facility] ORS;  Service: General;  Laterality: Right;  right; monitor anesthesia care   SQUAMOUS CELL CARCINOMA EXCISION     TONSILLECTOMY  1971    Social History   Socioeconomic History   Marital status: Widowed    Spouse name: Not on file   Number of children: 1   Years of education: Not on file   Highest education level: Bachelor's degree (e.g., BA, AB, BS)  Occupational History   Occupation: retired  Tobacco Use   Smoking status: Former    Packs/day: 0.91    Years: 33.00    Pack years: 30.03    Types: Cigarettes    Quit date: 04/19/2018    Years since quitting: 2.9   Smokeless tobacco: Never  Vaping Use   Vaping Use: Never used  Substance and Sexual Activity   Alcohol use: Yes    Comment: rarely   Drug use: No   Sexual activity: Not Currently  Other Topics Concern   Not on file  Social History Narrative   Not on file   Social Determinants of Health   Financial Resource Strain: Not on file  Food Insecurity: Not on file  Transportation Needs: Not on file  Physical Activity: Not on  file  Stress: Not on file  Social Connections: Not on file  Intimate Partner Violence: Not on file    Family History  Problem Relation Age of Onset   Breast cancer Mother 57   Osteosarcoma Mother        d. 38   Hypertension Brother    COPD Father    Hypertension Father    Breast cancer Paternal Aunt        dx 67s   Breast cancer Paternal Grandmother        dx 23s   Stomach cancer Paternal Grandfather        dx 4s   Breast cancer Paternal Aunt        dx 48s   Brain cancer Paternal Aunt        dx 59s     Current Outpatient Medications:    acetaminophen (TYLENOL) 650 MG CR tablet, Take 1,300 mg by mouth every 8 (eight) hours as needed for pain., Disp: , Rfl:    Biotin 1000 MCG tablet, Take 1,000 mcg by mouth daily., Disp: , Rfl:    Calcium Carbonate-Vit D-Min (CALCIUM 1200 PO), Take by mouth., Disp: , Rfl:    cholecalciferol (VITAMIN D) 25 MCG (1000 UNIT)  tablet, Take 1,000 Units by mouth daily., Disp: , Rfl:    diazepam (VALIUM) 5 MG tablet, Take 1 tablet (5 mg total) by mouth every 12 (twelve) hours as needed for anxiety., Disp: 60 tablet, Rfl: 5   hydrocortisone 2.5 % lotion, Apply 1 application topically daily as needed (when washing face)., Disp: , Rfl:    letrozole (FEMARA) 2.5 MG tablet, Take 1 tablet (2.5 mg total) by mouth daily., Disp: 30 tablet, Rfl: 3   lidocaine-prilocaine (EMLA) cream, Apply to affected area once, Disp: 30 g, Rfl: 3   losartan (COZAAR) 100 MG tablet, TAKE 1 TABLET (100 MG TOTAL) BY MOUTH DAILY., Disp: 90 tablet, Rfl: 1   Menthol, Topical Analgesic, (BIOFREEZE EX), Apply 1 application topically daily as needed (back pain)., Disp: , Rfl:    mometasone (ELOCON) 0.1 % cream, Apply 1 application topically daily as needed (ear irritation)., Disp: , Rfl:    Polyethyl Glycol-Propyl Glycol (SYSTANE OP), Place 1 drop into both eyes daily as needed (dry eyes)., Disp: , Rfl:    rosuvastatin (CRESTOR) 5 MG tablet, Take by mouth., Disp: , Rfl:    Tiotropium Bromide Monohydrate (SPIRIVA RESPIMAT) 2.5 MCG/ACT AERS, Inhale 2 puffs into the lungs daily., Disp: 4 g, Rfl: 0   triamterene-hydrochlorothiazide (DYAZIDE) 37.5-25 MG capsule, TAKE 1 CAPSULE EVERY DAY, Disp: 90 capsule, Rfl: 1   venlafaxine XR (EFFEXOR-XR) 75 MG 24 hr capsule, TAKE 3 CAPSULES (225 MG TOTAL) BY MOUTH DAILY WITH BREAKFAST., Disp: 270 capsule, Rfl: 1  Physical exam:  Vitals:   04/09/21 1338  BP: 117/66  Pulse: (!) 112  Resp: 19  Temp: (!) 97.2 F (36.2 C)  SpO2: 96%  Weight: 240 lb 4.8 oz (109 kg)   Physical Exam Cardiovascular:     Rate and Rhythm: Normal rate and regular rhythm.     Heart sounds: Normal heart sounds.  Pulmonary:     Effort: Pulmonary effort is normal.     Breath sounds: Normal breath sounds.  Skin:    General: Skin is warm and dry.  Neurological:     Mental Status: She is alert and oriented to person, place, and time.     CMP  Latest Ref Rng & Units 04/09/2021  Glucose 70 - 99 mg/dL  128(H)  BUN 8 - 23 mg/dL 24(H)  Creatinine 0.44 - 1.00 mg/dL 1.02(H)  Sodium 135 - 145 mmol/L 138  Potassium 3.5 - 5.1 mmol/L 3.4(L)  Chloride 98 - 111 mmol/L 101  CO2 22 - 32 mmol/L 28  Calcium 8.9 - 10.3 mg/dL 9.5  Total Protein 6.5 - 8.1 g/dL 7.0  Total Bilirubin 0.3 - 1.2 mg/dL 0.3  Alkaline Phos 38 - 126 U/L 126  AST 15 - 41 U/L 29  ALT 0 - 44 U/L 30   CBC Latest Ref Rng & Units 04/09/2021  WBC 4.0 - 10.5 K/uL 6.8  Hemoglobin 12.0 - 15.0 g/dL 12.0  Hematocrit 36.0 - 46.0 % 36.5  Platelets 150 - 400 K/uL 271     Assessment and plan- Patient is a 71 y.o. 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  She is s/p 12 weekly cycles of Taxol chemotherapy and adjuvant radiation treatment.  She is here for routine follow-up  Patient is currently on letrozole which she is tolerating well without any significant side effects.  She is also taking her calcium and vitamin D.  Patient will proceed with her first dose of adjuvant Zometa today.  She wishes to get it every 3 months and will complete it in 2 years.  I will see her back in 3 months with CBC with differential and CMP for dose 2 of Zometa.  History of B12 deficiency: We will give her prescription for self administering B12 injections at home   Visit Diagnosis 1. Use of letrozole (Femara)   2. Encounter for monitoring zoledronate therapy   3. B12 deficiency      Dr. Randa Evens, MD, MPH [facility] [phone] 04/09/2021 9:35 PM

## 2021-04-11 ENCOUNTER — Telehealth: Payer: Self-pay

## 2021-04-11 DIAGNOSIS — R0602 Shortness of breath: Secondary | ICD-10-CM | POA: Diagnosis not present

## 2021-04-11 NOTE — Telephone Encounter (Signed)
Copied from [place] [id number]. Topic: Referral - Question ?>> Apr 11, 2021 11:09 AM Pawlus, Brayton Layman A wrote: ?Reason for CRM: Pt called in stating she missed a call from Judson Roch regarding a referral, please advise. ?

## 2021-04-17 ENCOUNTER — Other Ambulatory Visit: Payer: Self-pay

## 2021-04-17 ENCOUNTER — Inpatient Hospital Stay: Payer: Medicare PPO | Admitting: Occupational Therapy

## 2021-04-17 DIAGNOSIS — L905 Scar conditions and fibrosis of skin: Secondary | ICD-10-CM
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
## 2021-04-22 DIAGNOSIS — D0439 Carcinoma in situ of skin of other parts of face: Secondary | ICD-10-CM | POA: Diagnosis not present

## 2021-04-22 DIAGNOSIS — L905 Scar conditions and fibrosis of skin: Secondary | ICD-10-CM | POA: Diagnosis not present

## 2021-04-29 ENCOUNTER — Other Ambulatory Visit: Payer: Self-pay | Admitting: General Surgery

## 2021-04-29 DIAGNOSIS — C50412 Malignant neoplasm of upper-outer quadrant of left female breast: Secondary | ICD-10-CM

## 2021-05-08 ENCOUNTER — Inpatient Hospital Stay: Payer: Medicare PPO | Attending: Radiation Oncology | Admitting: Occupational Therapy

## 2021-05-08 DIAGNOSIS — L905 Scar conditions and fibrosis of skin: Secondary | ICD-10-CM

## 2021-05-08 NOTE — Therapy (Signed)
[place] ?[facility] ?[address], [address] ?[place], [place], [address] ?Phone: [phone]   Fax:  [phone] ? ?Occupational Therapy Screen ? ?Patient Details  ?Name: Victoria Dean ?MRN: [id number] ?Date of Birth: Aug 24, 1950 ?No data recorded ? ?Encounter Date: 05/08/2021 ? ? OT End of Session - 05/08/21 1022   ? ? Visit Number 0   ? ?  ?  ? ?  ? ? ?Past Medical History:  ?Diagnosis Date  ? Anemia   ? Breast cancer ([place]) 04/2020  ? left  ? Cataract   ? Dyspnea   ? Dysrhythmia   ? Emphysema of lung ([place])   ? Family history of breast cancer   ? Family history of stomach cancer   ? Hypercholesteremia   ? Hypertension   ? Major depressive disorder   ? PVC (premature ventricular contraction)   ? sporatic  ? 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 Vitamin D deficiency   ? ? ?Past Surgical History:  ?Procedure Laterality Date  ? ABDOMINAL HYSTERECTOMY  1995  ? APPENDECTOMY  1994  ? BREAST BIOPSY Right 1998  ? neg  ? BREAST BIOPSY Left 05/30/2020  ? stere bx x clip path pending  ? BREAST LUMPECTOMY WITH SENTINEL LYMPH NODE BIOPSY Left 06/29/2020  ? Procedure: BREAST LUMPECTOMY WITH SENTINEL LYMPH NODE BX;  Surgeon: Robert Bellow, MD;  Location: [facility];  Service: General;  Laterality: Left;  ? ESI    ? LUMBAR MICRODISCECTOMY  2019  ? L3-4  ? LUMBAR MICRODISCECTOMY  2008  ? L4-L5 repeated  ? MICRODISCECTOMY LUMBAR  2008  ? L4-5  ? MICRODISCECTOMY LUMBAR  2010  ? L4-L5 repeated  ? NODE DISSECTION Left 08/13/2020  ? Procedure: AXILLARY NODE DISSECTION;  Surgeon: Robert Bellow, MD;  Location: [facility];  Service: General;  Laterality: Left;  ? PORTACATH PLACEMENT Right 09/05/2020  ? Procedure: INSERTION PORT-A-CATH;  Surgeon: Robert Bellow, MD;  Location: [facility];  Service: General;  Laterality: Right;  right; monitor anesthesia care  ? SQUAMOUS CELL CARCINOMA EXCISION    ? TONSILLECTOMY  1971  ? ? ?There were no vitals  filed for this visit. ? ? Subjective Assessment - 05/08/21 1053   ? ? Subjective  I am doing okay do not feel any swelling or heaviness in my arm or breast since of seeing you.  I have done the scar massage it gets little tender so I do not do it every day but it is smaller than it used to be.   ? Currently in Pain? No/denies   ? ?  ?  ? ?  ? ? ? ? ? ? LYMPHEDEMA/ONCOLOGY QUESTIONNAIRE - 05/08/21 0001   ? ?  ? Right Upper Extremity Lymphedema  ? 15 cm Proximal to Olecranon Process 42.5 cm   ? 10 cm Proximal to Olecranon Process 38.5 cm   ? Olecranon Process 32 cm   ? 15 cm Proximal to Ulnar Styloid Process 29.2 cm   ?  ? Left Upper Extremity Lymphedema  ? 15 cm Proximal to Olecranon Process 41 cm   ? 10 cm Proximal to Olecranon Process 37.5 cm   ? Olecranon Process 31.5 cm   ? 15 cm Proximal to Ulnar Styloid Process 29.5 cm   ? ?  ?  ? ?  ? ? ? ? ? ?Assessment and plan 04/09/21 DR RAO  note- Patient is a 71 y.o. 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  She is s/p 12 weekly cycles of Taxol chemotherapy and adjuvant radiation treatment.  She is here for routine follow-up ?  ?Patient is currently on letrozole which she is tolerating well without any significant side effects.  She is also taking her calcium and vitamin D. ?  ?Patient will proceed with her first dose of adjuvant Zometa today.  She wishes to get it every 3 months and will complete it in 2 years.  I will see her back in 3 months with CBC with differential and CMP for dose 2 of Zometa. ?  ?History of B12 deficiency: We will give her prescription for self administering B12 injections at home ?  ?Visit Diagnosis ?1. Use of letrozole (Femara)   ?2. Encounter for monitoring zoledronate therapy   ?3. B12 deficiency   ?  ?  ?  ?  ?  ?  ?OT SCREEN 04/17/21: ?  ?  Patient referred to OT for lymphedema education ,signs and symptoms. ?Patient reports she had a left lumpectomy  with about 14 total lymph nodes removed ?Had radiation that she finished in [place] do reports she had some blisters on around clavicle ,on the left breast and scapular area ?Patient is tender and has some scar tissue around lumpectomy scar below nipple ?Patient added on scar massage, OT used mini massager on scar with great success ?Patient to continue with scar massage ?Bilateral upper extremity circumference was taken -patient is right-handed-but left arm was increase at the bicep, elbow down to hand ?Patient do report using nondominant hand a lot-she loves working the flowers and doing things around the house ?Patient education was done on signs and symptoms of lymphedema as well as prevention ?Handout was provided and reviewed with patient ?Patient to follow-up with me in 2 to 3 weeks if needed ? ?OT SCREEN 05/08/21: ? ?Patient arrived for follow-up after seen 2 to 3 weeks ago for lymphedema education and scar massage. ?Patient reports she had a left lumpectomy with about 14 total lymph nodes removed ?Had radiation that she finished in [place] do reports she had some blisters on around clavicle ,on the left breast and scapular area ?Patient reports she has been doing scar massage but not every day gets little tender.  Area of scar tissue is smaller under left breast scar-more ping-pong ball size.  Reviewed again with patient scar massage and used mini massager with great success., ?Patient to continue with scar massage and will pick up a chip bag from OT to use over fibrotic scar tissue area.   ?This date patient circumference of left upper extremity is within normal limits compared to the right dominant upper extremity.  See flowsheet  ?Patient do report she is on a fluid pill that does not always work the same every day.  No lymphedema signs at this time.  Patient education was done last time on signs and symptoms of lymphedema as well as prevention ?Handout was provided and reviewed with  patient ?Patient to continue at home with home program can contact me if needed. ?  ? ? ? ? ? ? ? ? ? ? ? ? ? ? ? ? ? ? ? ? ? ? ? ? ?  ?  ? ? ?Visit Diagnosis: ?Scar condition and fibrosis of skin ? ? ? ?Problem List ?Patient Active Problem List  ?  Diagnosis Date Noted  ? Acute on chronic respiratory failure with hypoxia ([place]) 01/19/2021  ? Upper respiratory infection with cough and congestion 01/17/2021  ? B12 deficiency 10/09/2020  ? Genetic testing 06/26/2020  ? Family history of breast cancer   ? Family history of stomach cancer   ? Malignant neoplasm of upper-outer quadrant of left breast in female, estrogen receptor positive ([place]) 06/10/2020  ? Goals of care, counseling/discussion 06/10/2020  ? Impingement syndrome of right shoulder region 05/06/2019  ? Low back pain 01/24/2019  ? Personal history of tobacco use, presenting hazards to health 02/23/2018  ? History of lumbar laminectomy 04/30/2016  ? Actinic keratosis 11/21/2014  ? Adaptation reaction 11/21/2014  ? Allergic rhinitis 11/21/2014  ? Cornu cutaneum 11/21/2014  ? Clinical depression 11/21/2014  ? Dermatitis, eczematoid 11/21/2014  ? Enthesopathy 11/21/2014  ? Essential (primary) hypertension 11/21/2014  ? Anxiety, generalized 11/21/2014  ? Herniated nucleus pulposus 11/21/2014  ? Mild episode of recurrent major depressive disorder ([place]) 11/21/2014  ? Adiposity 11/21/2014  ? Arthritis, degenerative 11/21/2014  ? Basal cell papilloma 11/21/2014  ? Apnea, sleep 11/21/2014  ? Avitaminosis D 11/21/2014  ? ? ?Rosalyn Gess, OTR/L,CLT ?05/08/2021, 10:55 AM ? ?[place] ?[facility] ?[address], [address] ?[place], [place], [address] ?Phone: [phone]   Fax:  [phone] ? ?Name: Victoria Dean ?MRN: [id number] ?Date of Birth: [birth year] ? ?

## 2021-05-09 ENCOUNTER — Other Ambulatory Visit: Payer: Self-pay | Admitting: Family Medicine

## 2021-05-09 DIAGNOSIS — E7800 Pure hypercholesterolemia, unspecified: Secondary | ICD-10-CM

## 2021-05-09 DIAGNOSIS — I10 Essential (primary) hypertension: Secondary | ICD-10-CM

## 2021-05-22 ENCOUNTER — Other Ambulatory Visit: Payer: Self-pay | Admitting: Family Medicine

## 2021-05-22 MED ORDER — ROSUVASTATIN CALCIUM 5 MG PO TABS: 5.0000 mg | ORAL_TABLET | Freq: Every day | ORAL | 3 refills | Status: DC

## 2021-05-22 NOTE — Telephone Encounter (Signed)
Requested medication (s) are due for refill today:   Not sure  last prescribed by a provider from [facility] Dr. Noreene Filbert ? ?Requested medication (s) are on the active medication list:   Yes as a historical med. ? ?Future visit scheduled:   Yes in 1 wk with Dr. Rosanna Randy ? ? ?Last ordered: 03/29/2021 from provider at the [facility]. ? ?Returned because needs a new rx since this was prescribed by another provider.    ? ?Requested Prescriptions  ?Pending Prescriptions Disp Refills  ? rosuvastatin (CRESTOR) 5 MG tablet    ?  Sig: Take by mouth.  ?  ? Cardiovascular:  Antilipid - Statins 2 Failed - 05/22/2021 12:16 PM  ?  ?  Failed - Cr in normal range and within 360 days  ?  Creatinine, Ser  ?Date Value Ref Range Status  ?04/09/2021 1.02 (H) 0.44 - 1.00 mg/dL Final  ?  ?  ?  ?  Failed - Lipid Panel in normal range within the last 12 months  ?  Cholesterol, Total  ?Date Value Ref Range Status  ?02/23/2020 151 100 - 199 mg/dL Final  ? ?LDL Chol Calc (NIH)  ?Date Value Ref Range Status  ?02/23/2020 77 0 - 99 mg/dL Final  ? ?HDL  ?Date Value Ref Range Status  ?02/23/2020 53 >39 mg/dL Final  ? ?Triglycerides  ?Date Value Ref Range Status  ?02/23/2020 119 0 - 149 mg/dL Final  ? ?  ?  ?  Passed - Patient is not pregnant  ?  ?  Passed - Valid encounter within last 12 months  ?  Recent Outpatient Visits   ? ?      ? 3 months ago Dyspnea on exertion  ? [facility] Birdie Sons, MD  ? 4 months ago Upper respiratory infection with cough and congestion  ? [employer], Erin E, PA-C  ? 5 months ago Mild episode of recurrent major depressive disorder ([place])  ? [facility] Jerrol Banana., MD  ? 8 months ago Malignant neoplasm of upper-outer quadrant of left breast in female, estrogen receptor positive ([place])  ? [facility] Jerrol Banana., MD  ? 11 months ago Malignant neoplasm of upper-outer quadrant of left breast in female,  estrogen receptor positive ([place])  ? [facility] Jerrol Banana., MD  ? ?  ?  ?Future Appointments   ? ?        ? In 1 week Jerrol Banana., MD [facility]  ? ?  ? ? ?  ?  ?  ? ?

## 2021-05-22 NOTE — Telephone Encounter (Signed)
Medication Refill - Medication: rosuvastatin (CRESTOR) 5 MG tablet ? ?Has the patient contacted their pharmacy? No. Pt stated she needs a new Rx due to being prescribed by previous provider ? ?Preferred Pharmacy (with phone number or street name):  ?[facility] [place] Phone:  [phone]  ?Fax:  [phone]  ?  ? ?Has the patient been seen for an appointment in the last year OR does the patient have an upcoming appointment? Yes.   ? ?Agent: Please be advised that RX refills may take up to 3 business days. We ask that you follow-up with your pharmacy.  ?

## 2021-05-27 ENCOUNTER — Other Ambulatory Visit: Payer: Self-pay | Admitting: Oncology

## 2021-05-30 ENCOUNTER — Ambulatory Visit (INDEPENDENT_AMBULATORY_CARE_PROVIDER_SITE_OTHER): Payer: Medicare PPO | Admitting: Family Medicine

## 2021-05-30 ENCOUNTER — Encounter: Payer: Self-pay | Admitting: Family Medicine

## 2021-05-30 VITALS — BP 121/69 | HR 95 | Resp 16 | Ht 63.0 in | Wt 247.8 lb

## 2021-05-30 DIAGNOSIS — Z9889 Other specified postprocedural states: Secondary | ICD-10-CM

## 2021-05-30 DIAGNOSIS — M5441 Lumbago with sciatica, right side: Secondary | ICD-10-CM

## 2021-05-30 DIAGNOSIS — C50412 Malignant neoplasm of upper-outer quadrant of left female breast: Secondary | ICD-10-CM

## 2021-05-30 DIAGNOSIS — G4733 Obstructive sleep apnea (adult) (pediatric): Secondary | ICD-10-CM

## 2021-05-30 DIAGNOSIS — I10 Essential (primary) hypertension: Secondary | ICD-10-CM | POA: Diagnosis not present

## 2021-05-30 DIAGNOSIS — M5442 Lumbago with sciatica, left side: Secondary | ICD-10-CM

## 2021-05-30 DIAGNOSIS — M1990 Unspecified osteoarthritis, unspecified site: Secondary | ICD-10-CM

## 2021-05-30 DIAGNOSIS — Z170 Estrogen receptor positive status [ER+]: Secondary | ICD-10-CM | POA: Diagnosis not present

## 2021-05-30 DIAGNOSIS — F330 Major depressive disorder, recurrent, mild: Secondary | ICD-10-CM | POA: Diagnosis not present

## 2021-05-30 DIAGNOSIS — G8929 Other chronic pain: Secondary | ICD-10-CM

## 2021-05-30 NOTE — Progress Notes (Signed)
?  ? ? ?Established patient visit ? ? ?Patient: Victoria Dean   DOB: 1950/11/27   71 y.o. Female  MRN: [id number] ?Visit Date: 05/30/2021 ? ?Today's healthcare provider: [name], MD  ? ?No chief complaint on file. ? ?Subjective  ?  ?HPI  ?Patient has finished all of her treatment for her breast cancer.  She is feeling overall fairly well. ?She does have chronic low back pain with sciatica and needs to find a new back doctor.  Dr. Minerva Ends was her previous back surgeon he is retired.  She has had no recent falls.  She continues to live independently. ?She is seeing pulmonary now for her chronic dyspnea on exertion and occasional cough ?Follow up for Mild episode of recurrent major depressive disorder ([place]) ? ?The patient was last seen for this 6 months ago. ?Changes made at last visit include; In partial remission on Effexor to 25 daily.  Continue this.  Follow-up neck spring after she is through with radiation therapy. ? ?She reports good compliance with treatment. ?She feels that condition is Unchanged. ?She is not having side effects. none ? ?----------------------------------------------------------------------------------------- ?Hypertension, follow-up ? ?BP Readings from Last 3 Encounters:  ?05/30/21 (!) 137/49  ?04/09/21 117/66  ?04/04/21 118/70  ? Wt Readings from Last 3 Encounters:  ?05/30/21 247 lb 12.8 oz (112.4 kg)  ?04/09/21 240 lb 4.8 oz (109 kg)  ?04/04/21 240 lb (108.9 kg)  ?  ? ?She was last seen for hypertension 6 months ago.  ?BP at that visit was 120/66. Management since that visit includes; Excellent control . ? ?She reports good compliance with treatment. ?She is not having side effects. none ?She is following a Regular diet. ?She is not exercising. ?She does not smoke. ? ?Outside blood pressures are checked (range 115-120/65-75). ? ? ?Pertinent labs ?Lab Results  ?Component Value Date  ? CHOL 151 02/23/2020  ? HDL 53 02/23/2020  ? [place] 77 02/23/2020  ? TRIG 119 02/23/2020  ? CHOLHDL  2.8 02/23/2020  ? Lab Results  ?Component Value Date  ? NA 138 04/09/2021  ? K 3.4 (L) 04/09/2021  ? CREATININE 1.02 (H) 04/09/2021  ? GFRNONAA 58 (L) 04/09/2021  ? GLUCOSE 128 (H) 04/09/2021  ? TSH 0.538 01/19/2021  ?  ? ?The 10-year ASCVD risk score (Arnett DK, et al., 2019) is: 16.7% ? ?--------------------------------------------------------------------------------------------------- ?Medications: ?Outpatient Medications Prior to Visit  ?Medication Sig  ? acetaminophen (TYLENOL) 650 MG CR tablet Take 1,300 mg by mouth every 8 (eight) hours as needed for pain.  ? Biotin 1000 MCG tablet Take 1,000 mcg by mouth daily.  ? Calcium Carbonate-Vit D-Min (CALCIUM 1200 PO) Take by mouth.  ? cholecalciferol (VITAMIN D) 25 MCG (1000 UNIT) tablet Take 1,000 Units by mouth daily.  ? cyanocobalamin (,VITAMIN B-12,) 1000 MCG/ML injection Inject 1 mL (1,000 mcg total) into the muscle every 30 (thirty) days.  ? diazepam (VALIUM) 5 MG tablet Take 1 tablet (5 mg total) by mouth every 12 (twelve) hours as needed for anxiety.  ? hydrocortisone 2.5 % lotion Apply 1 application topically daily as needed (when washing face).  ? letrozole (FEMARA) 2.5 MG tablet TAKE 1 TABLET BY MOUTH ONCE DAILY.  ? lidocaine-prilocaine (EMLA) cream Apply to affected area once  ? losartan (COZAAR) 100 MG tablet TAKE 1 TABLET EVERY DAY  ? Menthol, Topical Analgesic, (BIOFREEZE EX) Apply 1 application topically daily as needed (back pain).  ? mometasone (ELOCON) 0.1 % cream Apply 1 application topically daily as needed (ear  irritation).  ? Polyethyl Glycol-Propyl Glycol (SYSTANE OP) Place 1 drop into both eyes daily as needed (dry eyes).  ? rosuvastatin (CRESTOR) 5 MG tablet Take 1 tablet (5 mg total) by mouth daily.  ? Syringe/Needle, Disp, (SYRINGE 3CC/25GX1") 25G X 1" 3 ML MISC 1 Syringe by Does not apply route every 30 (thirty) days.  ? triamterene-hydrochlorothiazide (DYAZIDE) 37.5-25 MG capsule TAKE 1 CAPSULE EVERY DAY  ? venlafaxine XR (EFFEXOR-XR)  75 MG 24 hr capsule TAKE 3 CAPSULES (225 MG TOTAL) BY MOUTH DAILY WITH BREAKFAST.  ? Tiotropium Bromide Monohydrate (SPIRIVA RESPIMAT) 2.5 MCG/ACT AERS Inhale 2 puffs into the lungs daily. (Patient not taking: Reported on 05/30/2021)  ? ?No facility-administered medications prior to visit.  ? ? ?Review of Systems  ?All other systems reviewed and are negative. ? ?Last lipids ?Lab Results  ?Component Value Date  ? CHOL 151 02/23/2020  ? HDL 53 02/23/2020  ? [place] 77 02/23/2020  ? TRIG 119 02/23/2020  ? CHOLHDL 2.8 02/23/2020  ? ?  ?  Objective  ?  ?BP (!) 137/49 (BP Location: Right Arm, Patient Position: Sitting, Cuff Size: Normal)   Pulse 95   Resp 16   Ht '5\' 3"'$  (1.6 m)   Wt 247 lb 12.8 oz (112.4 kg)   SpO2 97%   BMI 43.90 kg/m?  ?BP Readings from Last 3 Encounters:  ?05/30/21 121/69  ?04/09/21 117/66  ?04/04/21 118/70  ? ?Wt Readings from Last 3 Encounters:  ?05/30/21 247 lb 12.8 oz (112.4 kg)  ?04/09/21 240 lb 4.8 oz (109 kg)  ?04/04/21 240 lb (108.9 kg)  ? ?  ? ?Physical Exam ?Vitals reviewed.  ?Constitutional:   ?   General: She is not in acute distress. ?   Appearance: She is well-developed.  ?HENT:  ?   Head: Normocephalic and atraumatic.  ?   Right Ear: Hearing normal.  ?   Left Ear: Hearing normal.  ?   Nose: Nose normal.  ?Eyes:  ?   General: Lids are normal. No scleral icterus.    ?   Right eye: No discharge.     ?   Left eye: No discharge.  ?   Conjunctiva/sclera: Conjunctivae normal.  ?Cardiovascular:  ?   Rate and Rhythm: Normal rate and regular rhythm.  ?   Heart sounds: Normal heart sounds.  ?Pulmonary:  ?   Effort: Pulmonary effort is normal. No respiratory distress.  ?Skin: ?   Findings: No lesion or rash.  ?Neurological:  ?   General: No focal deficit present.  ?   Mental Status: She is alert and oriented to person, place, and time.  ?Psychiatric:     ?   Mood and Affect: Mood normal.     ?   Speech: Speech normal.     ?   Behavior: Behavior normal.     ?   Thought Content: Thought content  normal.     ?   Judgment: Judgment normal.  ?  ? ? ?No results found for any visits on 05/30/21. ? Assessment & Plan  ?  ? ?1. Chronic bilateral low back pain with bilateral sciatica ?Refer to spine surgeon ? ?2. History of lumbar laminectomy ? ?- Ambulatory referral to Orthopedic Surgery ? ?3. Essential (primary) hypertension ?Home blood pressures are good. ? ?4. Obstructive sleep apnea syndrome ?CPAP ? ?5. Osteoarthritis, unspecified osteoarthritis type, unspecified site ? ? ?6. Malignant neoplasm of upper-outer quadrant of left breast in female, estrogen receptor positive ([place]) ?Has finished treatment.  Follow-up with oncology and surgery ? ?7. Mild episode of recurrent major depressive disorder ([place]) ?Continue venlafaxine indefinitely. ? ? ?No follow-ups on file.  ?   ? ?I, [name], MD, have reviewed all documentation for this visit. The documentation on 06/04/21 for the exam, diagnosis, procedures, and orders are all accurate and complete. ? ? ? ?Andruw Battie Cranford Mon, MD  ?[facility] ?[phone] (phone) ?[phone] (fax) ? ?[facility] ?

## 2021-06-03 ENCOUNTER — Ambulatory Visit
Admission: RE | Admit: 2021-06-03 | Discharge: 2021-06-03 | Disposition: A | Discharge status: Home / Self Care | Payer: Medicare PPO | Source: Ambulatory Visit | Attending: General Surgery | Admitting: General Surgery

## 2021-06-03 DIAGNOSIS — C50412 Malignant neoplasm of upper-outer quadrant of left female breast: Secondary | ICD-10-CM | POA: Insufficient documentation

## 2021-06-03 DIAGNOSIS — Z853 Personal history of malignant neoplasm of breast: Secondary | ICD-10-CM | POA: Diagnosis not present

## 2021-06-03 HISTORY — DX: Personal history of irradiation: Z92.3

## 2021-06-03 HISTORY — DX: Personal history of antineoplastic chemotherapy: Z92.21

## 2021-06-11 ENCOUNTER — Ambulatory Visit: Payer: Medicare PPO | Admitting: Pulmonary Disease

## 2021-06-11 ENCOUNTER — Encounter: Payer: Self-pay | Admitting: Pulmonary Disease

## 2021-06-11 VITALS — BP 122/64 | HR 97 | Temp 97.8°F | Ht 63.0 in | Wt 241.2 lb

## 2021-06-11 DIAGNOSIS — J449 Chronic obstructive pulmonary disease, unspecified: Secondary | ICD-10-CM

## 2021-06-11 DIAGNOSIS — C50412 Malignant neoplasm of upper-outer quadrant of left female breast: Secondary | ICD-10-CM

## 2021-06-11 DIAGNOSIS — Z170 Estrogen receptor positive status [ER+]: Secondary | ICD-10-CM

## 2021-06-11 DIAGNOSIS — R0602 Shortness of breath: Secondary | ICD-10-CM

## 2021-06-11 DIAGNOSIS — Z853 Personal history of malignant neoplasm of breast: Secondary | ICD-10-CM | POA: Diagnosis not present

## 2021-06-11 MED ORDER — STIOLTO RESPIMAT 2.5-2.5 MCG/ACT IN AERS: 2.0000 | INHALATION_SPRAY | Freq: Every day | RESPIRATORY_TRACT | 0 refills | Status: DC

## 2021-06-11 NOTE — Patient Instructions (Signed)
We are going to give you a trial of Stiolto 2 inhalations daily.  Let us know how this inhaler does for you. ? ?Your COPD is rated as moderate to severe.  I am hoping that with the inhaler we can get it to a baseline of moderate. ? ?Weight loss will help with your breathing as well. ? ?Fortunately some of your breathing issues may be related to the medications you are taking for the treatment of your breast cancer. ? ?On follow-up visit we will determine if you may benefit from pulmonary rehab but I would like to get your lungs as compensated as possible prior to that. ? ?We will see you in follow-up in 2 to 3 months time call sooner should any new problems arise. ?

## 2021-06-11 NOTE — Progress Notes (Signed)
Subjective:    Patient ID: Victoria Dean, female    DOB: [birth year], 71 y.o.   MRN: [id number] Patient Care Team: Maple Hudson., MD as PCP - General (Family Medicine) Irene Limbo., MD as Consulting Physician (Ophthalmology) Dimmig, Maisie Fus, MD as Referring Physician (Orthopedic Surgery) Debbrah Alar, MD (Dermatology) Dasher, Cliffton Asters, MD (Dermatology) Creig Hines, MD as Consulting Physician (Hematology and Oncology) Carmina Miller, MD as Consulting Physician (Radiation Oncology) Lemar Livings Merrily Pew, MD as Consulting Physician (General Surgery)  Chief Complaint  Patient presents with   Follow-up    SOB with exertion and occ wheezing mainly at night.     HPI Patient is a 71 year old former smoker (quit 2018, 30 PY) presents for follow-up of dyspnea with any activity/exertion.  I initially saw the patient on 19 February 2021 and subsequently on 04 April 2021.  She states she started noticing dyspnea approximately 08-08-22and was more pronounced and noticeable in 10-Feb-2021.  She states that when she develops dyspnea on exertion sitting still and long slow breaths help her.  She was given a trial of Trelegy Ellipta and as needed albuterol during her last visit however she did not tolerate this due to worsening of her tachycardia which is a chronic problem for her.  She was unable to perform the PFTs requested a venously due to being claustrophobic and not being able to get into the PFT body box.  She has not had any recent fevers, chills or sweats since her last visit here.  A CT chest performed on 19 January 2021 showed no evidence of pulmonary emboli, there was bronchial wall thickening consistent with underlying bronchitis.    She does not endorse any chest pain, paroxysmal nocturnal dyspnea nor orthopnea.  No lower extremity edema or calf tenderness. She does not endorse any gastroesophageal reflux symptoms.     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  Underwent whole breast peripheral lymphatic radiation completed in January 2023.  She completed 12 cycles of weekly Taxol chemotherapy in October 2022.   She had overnight oximetry performed 12 March 2021 that showed no evidence of nocturnal hypoxemia.  Today she has brought her documentation for COVID vaccination status and we should be able to at least perform a simple spirometry which should not require body box determination and she should be able to perform.  Review of Systems A 10 point review of systems was performed and it is as noted above otherwise negative.  Patient Active Problem List   Diagnosis Date Noted   Acute on chronic respiratory failure with hypoxia (HCC) 01/19/2021   Upper respiratory infection with cough and congestion 01/17/2021   B12 deficiency 10/09/2020   Genetic testing 06/26/2020   Family history of breast cancer    Family history of stomach cancer    Malignant neoplasm of upper-outer quadrant of left breast in female, estrogen receptor positive (HCC) 06/10/2020   Goals of care, counseling/discussion 06/10/2020   Impingement syndrome of right shoulder region 05/06/2019   Low back pain 01/24/2019   Personal history of tobacco use, presenting hazards to health 02/23/2018   History of lumbar laminectomy 04/30/2016   Actinic keratosis 11/21/2014   Adaptation reaction 11/21/2014   Allergic rhinitis 11/21/2014   Cornu cutaneum 11/21/2014   Clinical depression 11/21/2014   Dermatitis, eczematoid 11/21/2014   Enthesopathy 11/21/2014   Essential (primary) hypertension 11/21/2014   Anxiety, generalized 11/21/2014  Herniated nucleus pulposus 11/21/2014   Mild episode of recurrent major depressive disorder (HCC) 11/21/2014   Adiposity 11/21/2014   Arthritis, degenerative 11/21/2014   Basal cell papilloma 11/21/2014   Apnea, sleep 11/21/2014   Avitaminosis D 11/21/2014   Social History   Tobacco Use   Smoking status:  Former    Packs/day: 0.91    Years: 33.00    Pack years: 30.03    Types: Cigarettes    Quit date: 04/19/2018    Years since quitting: 3.1   Smokeless tobacco: Never  Substance Use Topics   Alcohol use: Yes    Comment: rarely   Allergies  Allergen Reactions   Other Itching    Brand name bandaids with cloth backing causes redness and itching   Statins     Muscle weakness   Zetia [Ezetimibe]     Leg pain, upset stomach   Sulfa Antibiotics Rash   Current Meds  Medication Sig   acetaminophen (TYLENOL) 650 MG CR tablet Take 1,300 mg by mouth every 8 (eight) hours as needed for pain.   albuterol (VENTOLIN HFA) 108 (90 Base) MCG/ACT inhaler Inhale 2 puffs into the lungs every 6 (six) hours as needed for wheezing or shortness of breath.   Biotin 1000 MCG tablet Take 1,000 mcg by mouth daily.   Calcium Carbonate-Vit D-Min (CALCIUM 1200 PO) Take by mouth.   cholecalciferol (VITAMIN D) 25 MCG (1000 UNIT) tablet Take 1,000 Units by mouth daily.   cyanocobalamin (,VITAMIN B-12,) 1000 MCG/ML injection Inject 1 mL (1,000 mcg total) into the muscle every 30 (thirty) days.   diazepam (VALIUM) 5 MG tablet Take 1 tablet (5 mg total) by mouth every 12 (twelve) hours as needed for anxiety.   hydrocortisone 2.5 % lotion Apply 1 application topically daily as needed (when washing face).   letrozole (FEMARA) 2.5 MG tablet TAKE 1 TABLET BY MOUTH ONCE DAILY.   lidocaine-prilocaine (EMLA) cream Apply to affected area once   losartan (COZAAR) 100 MG tablet TAKE 1 TABLET EVERY DAY   Menthol, Topical Analgesic, (BIOFREEZE EX) Apply 1 application topically daily as needed (back pain).   mometasone (ELOCON) 0.1 % cream Apply 1 application topically daily as needed (ear irritation).   Polyethyl Glycol-Propyl Glycol (SYSTANE OP) Place 1 drop into both eyes daily as needed (dry eyes).   rosuvastatin (CRESTOR) 5 MG tablet Take 1 tablet (5 mg total) by mouth daily.   Syringe/Needle, Disp, (SYRINGE 3CC/25GX1") 25G X  1" 3 ML MISC 1 Syringe by Does not apply route every 30 (thirty) days.   triamterene-hydrochlorothiazide (DYAZIDE) 37.5-25 MG capsule TAKE 1 CAPSULE EVERY DAY   venlafaxine XR (EFFEXOR-XR) 75 MG 24 hr capsule TAKE 3 CAPSULES (225 MG TOTAL) BY MOUTH DAILY WITH BREAKFAST.   zoledronic acid (ZOMETA) 4 MG/5ML injection Inject 4 mg into the vein once.       Objective:   Physical Exam BP 122/64 (BP Location: Right Arm, Cuff Size: Large)   Pulse 97   Temp 97.8 F (36.6 C) (Temporal)   Ht 5\' 3"  (1.6 m)   Wt 241 lb 3.2 oz (109.4 kg)   SpO2 96%   BMI 42.73 kg/m  GENERAL: Obese woman, no acute distress, ambulatory with assistance assistance of a cane HEAD: Normocephalic, atraumatic.  EYES: Pupils equal, round, reactive to light.  No scleral icterus.  MOUTH: Oral mucosa moist, no thrush. NECK: Supple. No thyromegaly. Trachea midline. No JVD.  No adenopathy. PULMONARY: Good air entry bilaterally.  Coarse, otherwise, no adventitious sounds.  CARDIOVASCULAR: S1 and S2. Regular rate and rhythm.  No rubs, murmurs or gallops heard. ABDOMEN: Obese, otherwise benign. MUSCULOSKELETAL: No joint deformity, no clubbing, no edema.  NEUROLOGIC: Walks with assistance of a cane.  Otherwise nonfocal. SKIN: Intact,warm,dry. PSYCH: Somewhat anxious mood, normal behavior.  Spirometry was performed today: This showed an FEV1 of 1.0 L or 46% predicted, FVC 1.6 L or 88% predicted.  FEV1/FVC 60%.  FEF 2575 severely decreased at 30%.  Consistent with moderate to severe airway obstruction.     Assessment & Plan:     ICD-10-CM   1. COPD, moderate (HCC) Moderate/severe  J44.9    Trial of Stiolto 2 inhalations daily Continue as needed albuterol Does not tolerate LABA due to tachycardia    2. SOB (shortness of breath)  R06.02 Spirometry with Graph   Likely due to poorly compensated COPD Trial of Stiolto as above    3. Malignant neoplasm of upper-outer quadrant of left breast in female, estrogen receptor positive  (HCC)  C50.412    Z17.0    Issue adds complexity to her management Follows with oncology    4. Obesity, Class III, BMI 40-49.9 (morbid obesity) (HCC)  E66.01    Issue adds complexity to her management Adds to her sensation of dyspnea as well She would benefit from weight loss     Orders Placed This Encounter  Procedures   Spirometry with Graph    Order Specific Question:   Where should this test be performed?    Answer:   [facility]   Meds ordered this encounter  Medications   Tiotropium Bromide-Olodaterol (STIOLTO RESPIMAT) 2.5-2.5 MCG/ACT AERS    Sig: Inhale 2 puffs into the lungs daily.    Dispense:  4 g    Refill:  0    Order Specific Question:   Lot Number?    Answer:   [id number]    Order Specific Question:   Expiration Date?    Answer:   04/04/2023    Order Specific Question:   Quantity    Answer:   2   We will see the patient in follow-up in 3 months time she is to contact us prior to that time should any new difficulties arise.   Gailen Shelter, MD Advanced Bronchoscopy PCCM [facility]    *This note was dictated using voice recognition software/Dragon.  Despite best efforts to proofread, errors can occur which can change the meaning. Any transcriptional errors that result from this process are unintentional and may not be fully corrected at the time of dictation.

## 2021-07-05 DIAGNOSIS — M545 Low back pain, unspecified: Secondary | ICD-10-CM | POA: Diagnosis not present

## 2021-07-10 ENCOUNTER — Inpatient Hospital Stay: Payer: Medicare PPO | Attending: Radiation Oncology

## 2021-07-10 ENCOUNTER — Inpatient Hospital Stay: Payer: Medicare PPO | Admitting: Hospice and Palliative Medicine

## 2021-07-10 ENCOUNTER — Inpatient Hospital Stay: Payer: Medicare PPO

## 2021-07-10 VITALS — BP 134/73 | HR 90 | Temp 99.3°F | Resp 16 | Wt 237.0 lb

## 2021-07-10 DIAGNOSIS — Z79811 Long term (current) use of aromatase inhibitors: Secondary | ICD-10-CM | POA: Diagnosis not present

## 2021-07-10 DIAGNOSIS — C50412 Malignant neoplasm of upper-outer quadrant of left female breast: Secondary | ICD-10-CM | POA: Diagnosis not present

## 2021-07-10 DIAGNOSIS — Z7983 Long term (current) use of bisphosphonates: Secondary | ICD-10-CM | POA: Diagnosis not present

## 2021-07-10 DIAGNOSIS — Z170 Estrogen receptor positive status [ER+]: Secondary | ICD-10-CM

## 2021-07-10 DIAGNOSIS — Z79899 Other long term (current) drug therapy: Secondary | ICD-10-CM | POA: Insufficient documentation

## 2021-07-10 DIAGNOSIS — Z5181 Encounter for therapeutic drug level monitoring: Secondary | ICD-10-CM | POA: Diagnosis not present

## 2021-07-10 DIAGNOSIS — E538 Deficiency of other specified B group vitamins: Secondary | ICD-10-CM

## 2021-07-10 LAB — CBC WITH DIFFERENTIAL/PLATELET
Abs Immature Granulocytes: 0.02 10*3/uL (ref 0.00–0.07)
Basophils Absolute: 0 10*3/uL (ref 0.0–0.1)
Basophils Relative: 1 %
Eosinophils Absolute: 0.1 10*3/uL (ref 0.0–0.5)
Eosinophils Relative: 2 %
HCT: 36.1 % (ref 36.0–46.0)
Hemoglobin: 11.9 g/dL — ABNORMAL LOW (ref 12.0–15.0)
Immature Granulocytes: 0 %
Lymphocytes Relative: 30 %
Lymphs Abs: 1.8 10*3/uL (ref 0.7–4.0)
MCH: 30.4 pg (ref 26.0–34.0)
MCHC: 33 g/dL (ref 30.0–36.0)
MCV: 92.1 fL (ref 80.0–100.0)
Monocytes Absolute: 0.6 10*3/uL (ref 0.1–1.0)
Monocytes Relative: 10 %
Neutro Abs: 3.5 10*3/uL (ref 1.7–7.7)
Neutrophils Relative %: 57 %
Platelets: 268 10*3/uL (ref 150–400)
RBC: 3.92 MIL/uL (ref 3.87–5.11)
RDW: 14.2 % (ref 11.5–15.5)
WBC: 6.1 10*3/uL (ref 4.0–10.5)
nRBC: 0 % (ref 0.0–0.2)

## 2021-07-10 LAB — COMPREHENSIVE METABOLIC PANEL
ALT: 27 U/L (ref 0–44)
AST: 24 U/L (ref 15–41)
Albumin: 4 g/dL (ref 3.5–5.0)
Alkaline Phosphatase: 94 U/L (ref 38–126)
Anion gap: 9 (ref 5–15)
BUN: 31 mg/dL — ABNORMAL HIGH (ref 8–23)
CO2: 28 mmol/L (ref 22–32)
Calcium: 9.3 mg/dL (ref 8.9–10.3)
Chloride: 99 mmol/L (ref 98–111)
Creatinine, Ser: 0.89 mg/dL (ref 0.44–1.00)
GFR, Estimated: >60 mL/min (ref >60)
Glucose, Bld: 99 mg/dL (ref 70–99)
Potassium: 3.4 mmol/L — ABNORMAL LOW (ref 3.5–5.1)
Sodium: 136 mmol/L (ref 135–145)
Total Bilirubin: 0.4 mg/dL (ref 0.3–1.2)
Total Protein: 7.1 g/dL (ref 6.5–8.1)

## 2021-07-10 MED ORDER — SODIUM CHLORIDE 0.9 % IV SOLN
Freq: Once | INTRAVENOUS | Status: AC
  Filled 2021-07-10: qty 250

## 2021-07-10 MED ORDER — HEPARIN SOD (PORK) LOCK FLUSH 100 UNIT/ML IV SOLN
500.0000 [IU] | Freq: Once | INTRAVENOUS | Status: AC | PRN
  Administered 2021-07-10: 500 [IU]
  Filled 2021-07-10: qty 5

## 2021-07-10 MED ORDER — ZOLEDRONIC ACID 4 MG/100ML IV SOLN
4.0000 mg | Freq: Once | INTRAVENOUS | Status: AC
  Administered 2021-07-10: 4 mg via INTRAVENOUS
  Filled 2021-07-10: qty 100

## 2021-07-10 NOTE — Patient Instructions (Signed)

## 2021-07-10 NOTE — Progress Notes (Signed)
Pt returns for follow-up. She would like to know when it will be safe to proceed with cataract surgery. She also reports that her spine doctor has recommended gabapentin, but wanted to make sure that medication was safe to take.

## 2021-07-10 NOTE — Progress Notes (Signed)
Hematology/Oncology Consult note [facility]  [phone] [phone]  Patient Care Team: Jerrol Banana., MD as PCP - General (Family Medicine) Lorelee Cover., MD as Consulting Physician (Ophthalmology) Dimmig, Marcello Moores, MD as Referring Physician (Orthopedic Surgery) Oneta Rack, MD (Dermatology) Dasher, Rayvon Char, MD (Dermatology) Sindy Guadeloupe, MD as Consulting Physician (Hematology and Oncology) Noreene Filbert, MD as Consulting Physician (Radiation Oncology) Bary Castilla [name], MD as Consulting Physician (General Surgery)   Name of the patient: Victoria Dean  [id number]  [birth year]   Date of visit: 07/10/21  Diagnosis- pathological prognostic stage Ib invasive lobular carcinoma of the left breast [id number] ER/PR positive HER2 negative    Chief complaint/ Reason for visit-routine follow-up of breast cancer and to receive Zometa  Heme/Onc history: Patient is a 71 year old female with a past medical history significant for claustrophobia, hypertension hypercholesterolemia, depression among other medical problems.  Patient began to notice retraction of her left nipple.  This was followed by a diagnostic mammogram which showed vague shadowing in the upper outer quadrant of the left breast without a definitive mass.  Benign cysts in the lower left breast measuring 1.5 cm.  No evidence of malignancy in the right breast.  No evidence of abnormal left axillary lymph nodes.  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   Bilateral breast MRI showed 7 x 6 x 4 cm area of non-mass enhancement in the upper outer left breast at 1 o'clock position.  This involves anterior and middle third of the breast and extension into the posterior third.  2 inferior left  axillary lymph nodes with mild cortical thickening.   PET CT scan showed 1.7 cm hypermetabolic nodule in the midline of the neck which could represent thyroglossal cyst but malignancy cannot be excluded. Patient underwent left-sided lumpectomy with nipple excision.     Final pathology showed invasive lobular carcinoma grade 210.5 cm with associated DCIS.  Skin/nipple are present and involved.  Invasive carcinoma invades the nipple without skin ulceration.  Lymphovascular invasion present.  Margins negative.  3 out of 4 lymph nodes positive for malignancy 11 mm largest deposit with extranodal extension.  pT3 pN1 a cM0   Patient was taken back for axillary dissection given 3 lymph nodes positive with extranodal extension and 4 more lymph nodes out of 9 were positive with extranodal extension   Given cardiovascular comorbidities and patient age none anthracycline adjuvant chemotherapy options were discussed.  Patient did not wish to proceed with TC chemotherapy.  She completed 12 cycles of weekly Taxol chemotherapy in October 2022 followed by adjuvant radiation therapy.  Interval history-tolerating letrozole well without any significant side effects.  She has chronic fatigue and exertional dyspnea and is followed by pulmonary.  Patient says that she will start PT soon.  She reports that the B12 injections have helped the fatigue some.  ECOG PS- 2 Pain scale- 0   Review of systems- Review of Systems  Constitutional:  Positive for malaise/fatigue. Negative for chills, fever and weight loss.  HENT:  Negative for congestion, ear discharge and nosebleeds.   Eyes:  Negative for blurred vision.  Respiratory:  Negative for cough, hemoptysis, sputum production, shortness of breath and wheezing.   Cardiovascular:  Negative for chest pain, palpitations, orthopnea and claudication.  Gastrointestinal:  Negative for abdominal  pain, blood in stool, constipation, diarrhea, heartburn, melena, nausea and vomiting.   Genitourinary:  Negative for dysuria, flank pain, frequency, hematuria and urgency.  Musculoskeletal:  Positive for back pain. Negative for joint pain and myalgias.  Skin:  Negative for rash.  Neurological:  Negative for dizziness, tingling, focal weakness, seizures, weakness and headaches.  Endo/Heme/Allergies:  Does not bruise/bleed easily.  Psychiatric/Behavioral:  Negative for depression and suicidal ideas. The patient does not have insomnia.       Allergies  Allergen Reactions   Other Itching    Brand name bandaids with cloth backing causes redness and itching   Statins     Muscle weakness   Zetia [Ezetimibe]     Leg pain, upset stomach   Sulfa Antibiotics Rash     Past Medical History:  Diagnosis Date   Anemia    Breast cancer ([place]) 04/2020   left   Cataract    Dyspnea    Dysrhythmia    Emphysema of lung ([place])    Family history of breast cancer    Family history of stomach cancer    Hypercholesteremia    Hypertension    Major depressive disorder    Personal history of chemotherapy    Personal history of radiation therapy    PVC (premature ventricular contraction)    sporatic   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   BREAST BIOPSY Right 1998   neg   BREAST BIOPSY Left 05/30/2020   stere bx x clip path pending   BREAST LUMPECTOMY WITH SENTINEL LYMPH NODE BIOPSY Left 06/29/2020   Procedure: BREAST LUMPECTOMY WITH SENTINEL LYMPH NODE BX;  Surgeon: Robert Bellow, MD;  Location: [facility] ORS;  Service: General;  Laterality: Left;   ESI     LUMBAR MICRODISCECTOMY  2019   L3-4   LUMBAR MICRODISCECTOMY  2008   L4-L5 repeated   MICRODISCECTOMY LUMBAR  2008   L4-5   MICRODISCECTOMY LUMBAR  2010   L4-L5 repeated   NODE DISSECTION Left 08/13/2020   Procedure: AXILLARY NODE DISSECTION;   Surgeon: Robert Bellow, MD;  Location: [facility];  Service: General;  Laterality: Left;   PORTACATH PLACEMENT Right 09/05/2020   Procedure: INSERTION PORT-A-CATH;  Surgeon: Robert Bellow, MD;  Location: [facility] ORS;  Service: General;  Laterality: Right;  right; monitor anesthesia care   SQUAMOUS CELL CARCINOMA EXCISION     TONSILLECTOMY  1971    Social History   Socioeconomic History   Marital status: Widowed    Spouse name: Not on file   Number of children: 1   Years of education: Not on file   Highest education level: Bachelor's degree (e.g., BA, AB, BS)  Occupational History   Occupation: retired  Tobacco Use   Smoking status: Former    Packs/day: 0.91    Years: 33.00    Pack years: 30.03    Types: Cigarettes    Quit date: 04/19/2018    Years since quitting: 3.2   Smokeless tobacco: Never  Vaping Use   Vaping Use: Never used  Substance and Sexual Activity   Alcohol use: Yes    Comment: rarely   Drug use: No   Sexual activity: Not Currently  Other Topics Concern   Not on file  Social History Narrative   Not on file   Social Determinants of Health   Financial Resource Strain: Not on file  Food Insecurity: Not on file  Transportation Needs: Not on file  Physical Activity: Not on file  Stress: Not on file  Social Connections: Not on file  Intimate Partner Violence: Not on file    Family History  Problem Relation Age of Onset   Breast cancer Mother 62   Osteosarcoma Mother        d. 22   Hypertension Brother    COPD Father    Hypertension Father    Breast cancer Paternal Aunt        dx 65s   Breast cancer Paternal Grandmother        dx 32s   Stomach cancer Paternal Grandfather        dx 77s   Breast cancer Paternal Aunt        dx 17s   Brain cancer Paternal Aunt        dx 59s     Current Outpatient Medications:    acetaminophen (TYLENOL) 650 MG CR tablet, Take 1,300 mg by mouth every 8 (eight) hours as needed for pain., Disp: , Rfl:     albuterol (VENTOLIN HFA) 108 (90 Base) MCG/ACT inhaler, Inhale 2 puffs into the lungs every 6 (six) hours as needed for wheezing or shortness of breath., Disp: , Rfl:    Biotin 1000 MCG tablet, Take 1,000 mcg by mouth daily., Disp: , Rfl:    Calcium Carbonate-Vit D-Min (CALCIUM 1200 PO), Take by mouth., Disp: , Rfl:    cholecalciferol (VITAMIN D) 25 MCG (1000 UNIT) tablet, Take 1,000 Units by mouth daily., Disp: , Rfl:    cyanocobalamin (,VITAMIN B-12,) 1000 MCG/ML injection, Inject 1 mL (1,000 mcg total) into the muscle every 30 (thirty) days., Disp: 1 mL, Rfl: 11   diazepam (VALIUM) 5 MG tablet, Take 1 tablet (5 mg total) by mouth every 12 (twelve) hours as needed for anxiety., Disp: 60 tablet, Rfl: 5   hydrocortisone 2.5 % lotion, Apply 1 application topically daily as needed (when washing face)., Disp: , Rfl:    letrozole (FEMARA) 2.5 MG tablet, TAKE 1 TABLET BY MOUTH ONCE DAILY., Disp: 30 tablet, Rfl: 11   lidocaine-prilocaine (EMLA) cream, Apply to affected area once, Disp: 30 g, Rfl: 3   losartan (COZAAR) 100 MG tablet, TAKE 1 TABLET EVERY DAY, Disp: 90 tablet, Rfl: 1   Menthol, Topical Analgesic, (BIOFREEZE EX), Apply 1 application topically daily as needed (back pain)., Disp: , Rfl:    mometasone (ELOCON) 0.1 % cream, Apply 1 application topically daily as needed (ear irritation)., Disp: , Rfl:    Polyethyl Glycol-Propyl Glycol (SYSTANE OP), Place 1 drop into both eyes daily as needed (dry eyes)., Disp: , Rfl:    rosuvastatin (CRESTOR) 5 MG tablet, Take 1 tablet (5 mg total) by mouth daily., Disp: 90 tablet, Rfl: 3   Syringe/Needle, Disp, (SYRINGE 3CC/25GX1") 25G X 1" 3 ML MISC, 1 Syringe by Does not apply route every 30 (thirty) days., Disp: 12 each, Rfl: 0   Tiotropium Bromide-Olodaterol (STIOLTO RESPIMAT) 2.5-2.5 MCG/ACT AERS, Inhale 2 puffs into the lungs daily., Disp: 4 g, Rfl: 0   triamterene-hydrochlorothiazide (DYAZIDE) 37.5-25 MG capsule, TAKE 1 CAPSULE EVERY DAY, Disp: 90 capsule,  Rfl: 1   venlafaxine XR (EFFEXOR-XR) 75 MG 24 hr capsule, TAKE 3 CAPSULES (225 MG TOTAL) BY MOUTH DAILY WITH BREAKFAST., Disp: 270 capsule, Rfl: 1   zoledronic acid (  ZOMETA) 4 MG/5ML injection, Inject 4 mg into the vein once., Disp: , Rfl:   Physical exam:  There were no vitals filed for this visit.  Physical Exam Constitutional:      Appearance: Normal appearance.  Cardiovascular:     Rate and Rhythm: Normal rate and regular rhythm.     Heart sounds: Normal heart sounds.  Pulmonary:     Effort: Pulmonary effort is normal.     Breath sounds: Normal breath sounds.  Abdominal:     General: Bowel sounds are normal.     Palpations: Abdomen is soft.  Skin:    General: Skin is warm and dry.  Neurological:     General: No focal deficit present.     Mental Status: She is alert and oriented to person, place, and time.        Latest Ref Rng & Units 04/09/2021    1:25 PM  CMP  Glucose 70 - 99 mg/dL 128    BUN 8 - 23 mg/dL 24    Creatinine 0.44 - 1.00 mg/dL 1.02    Sodium 135 - 145 mmol/L 138    Potassium 3.5 - 5.1 mmol/L 3.4    Chloride 98 - 111 mmol/L 101    CO2 22 - 32 mmol/L 28    Calcium 8.9 - 10.3 mg/dL 9.5    Total Protein 6.5 - 8.1 g/dL 7.0    Total Bilirubin 0.3 - 1.2 mg/dL 0.3    Alkaline Phos 38 - 126 U/L 126    AST 15 - 41 U/L 29    ALT 0 - 44 U/L 30        Latest Ref Rng & Units 04/09/2021    1:25 PM  CBC  WBC 4.0 - 10.5 K/uL 6.8    Hemoglobin 12.0 - 15.0 g/dL 12.0    Hematocrit 36.0 - 46.0 % 36.5    Platelets 150 - 400 K/uL 271       Assessment and plan- Patient is a 71 y.o. 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  She is s/p 12 weekly cycles of Taxol chemotherapy and adjuvant radiation treatment.  She is here for routine follow-up  Patient is currently on letrozole which she is tolerating well without any significant side effects.  She is also taking her calcium and  vitamin D and B12 injections.  Labs grossly unchanged from baseline.  She tolerated first dose of Zometa on 04/09/2021.  Proceed with second dose today.  Patient will RTC in 3 months for CBC/CMP and to see Dr. Janese Banks and receive dose #3 of Zometa.   Visit Diagnosis 1. Malignant neoplasm of upper-outer quadrant of left breast in female, estrogen receptor positive ([place])   2. Encounter for monitoring zoledronate therapy      Altha Harm, PhD, NP-C [facility] [phone] 07/10/2021 1:23 PM

## 2021-07-26 DIAGNOSIS — Z4432 Encounter for fitting and adjustment of external left breast prosthesis: Secondary | ICD-10-CM | POA: Diagnosis not present

## 2021-07-26 DIAGNOSIS — Z9012 Acquired absence of left breast and nipple: Secondary | ICD-10-CM | POA: Diagnosis not present

## 2021-07-26 DIAGNOSIS — C50112 Malignant neoplasm of central portion of left female breast: Secondary | ICD-10-CM | POA: Diagnosis not present

## 2021-08-05 DIAGNOSIS — M5416 Radiculopathy, lumbar region: Secondary | ICD-10-CM | POA: Diagnosis not present

## 2021-08-12 DIAGNOSIS — M5416 Radiculopathy, lumbar region: Secondary | ICD-10-CM | POA: Diagnosis not present

## 2021-08-14 ENCOUNTER — Encounter: Payer: Self-pay | Admitting: Pulmonary Disease

## 2021-08-14 ENCOUNTER — Ambulatory Visit: Payer: Medicare PPO | Admitting: Pulmonary Disease

## 2021-08-14 VITALS — BP 126/74 | HR 94 | Temp 98.0°F | Ht 63.0 in | Wt 235.0 lb

## 2021-08-14 DIAGNOSIS — M5416 Radiculopathy, lumbar region: Secondary | ICD-10-CM | POA: Insufficient documentation

## 2021-08-14 DIAGNOSIS — J42 Unspecified chronic bronchitis: Secondary | ICD-10-CM

## 2021-08-14 DIAGNOSIS — C50412 Malignant neoplasm of upper-outer quadrant of left female breast: Secondary | ICD-10-CM | POA: Diagnosis not present

## 2021-08-14 DIAGNOSIS — J449 Chronic obstructive pulmonary disease, unspecified: Secondary | ICD-10-CM

## 2021-08-14 DIAGNOSIS — Z170 Estrogen receptor positive status [ER+]: Secondary | ICD-10-CM

## 2021-08-14 DIAGNOSIS — R0602 Shortness of breath: Secondary | ICD-10-CM

## 2021-08-14 MED ORDER — STIOLTO RESPIMAT 2.5-2.5 MCG/ACT IN AERS: 2.0000 | INHALATION_SPRAY | Freq: Every day | RESPIRATORY_TRACT | 0 refills | Status: DC

## 2021-08-14 MED ORDER — STIOLTO RESPIMAT 2.5-2.5 MCG/ACT IN AERS: 2.0000 | INHALATION_SPRAY | Freq: Every day | RESPIRATORY_TRACT | 4 refills | Status: DC

## 2021-08-14 NOTE — Progress Notes (Signed)
Subjective:    Patient ID: Victoria Dean, female    DOB: [birth year], 71 y.o.   MRN: [id number] Patient Care Team: Jerrol Banana., MD as PCP - General (Family Medicine) Lorelee Cover., MD as Consulting Physician (Ophthalmology) Dimmig, Marcello Moores, MD as Referring Physician (Orthopedic Surgery) Oneta Rack, MD (Dermatology) Dasher, Rayvon Char, MD (Dermatology) Sindy Guadeloupe, MD as Consulting Physician (Hematology and Oncology) Noreene Filbert, MD as Consulting Physician (Radiation Oncology) Bary Castilla [name], MD as Consulting Physician (General Surgery)  Chief Complaint  Patient presents with   Follow-up    SOB with exertion.    HPI The patient is 71 year old former smoker (quit 2018, 30 PY) who presents for follow-up of dyspnea with activity and exertion.  We initially saw her on 19 February 2021.  She had been noticing dyspnea since approximately July 2022.  This is a scheduled visit, she was previously seen on 11 Jun 2021 where we finally were able to perform spirometry.  Previously the patient had declined PFTs due to severe claustrophobia.  On office spirometry she was noted to have moderate to severe airway obstruction and was given a trial of Stiolto 2 inhalations daily.  The patient notes that this inhaler has been helping her tremendously.  She hardly has need for her albuterol for rescue.  She has not had any fevers, chills or sweats.  No cough or sputum production.  No lower extremity edema or calf tenderness.  Feels Stiolto therapy is helping her tremendously.  DATA 06/11/2021 office spirometry: FEV1 1.0 L or 46% predicted, FVC 1.6 L or 88% predicted, FEV1/FVC 60%, FEF 25-75 severely decreased at 30%.Consistent with moderate to severe airway obstruction.  Review of Systems A 10 point review of systems was performed and it is as noted above otherwise negative.  Patient Active Problem List   Diagnosis Date Noted   Lumbar radiculopathy 08/14/2021   Acute on chronic  respiratory failure with hypoxia ([employer]) 01/19/2021   Upper respiratory infection with cough and congestion 01/17/2021   B12 deficiency 10/09/2020   Genetic testing 06/26/2020   Family history of breast cancer    Family history of stomach cancer    Malignant neoplasm of upper-outer quadrant of left breast in female, estrogen receptor positive ([place]) 06/10/2020   Goals of care, counseling/discussion 06/10/2020   Impingement syndrome of right shoulder region 05/06/2019   Low back pain 01/24/2019   Personal history of tobacco use, presenting hazards to health 02/23/2018   History of lumbar laminectomy 04/30/2016   Actinic keratosis 11/21/2014   Adaptation reaction 11/21/2014   Allergic rhinitis 11/21/2014   Cornu cutaneum 11/21/2014   Clinical depression 11/21/2014   Dermatitis, eczematoid 11/21/2014   Enthesopathy 11/21/2014   Essential (primary) hypertension 11/21/2014   Anxiety, generalized 11/21/2014   Herniated nucleus pulposus 11/21/2014   Mild episode of recurrent major depressive disorder ([place]) 11/21/2014   Adiposity 11/21/2014   Arthritis, degenerative 11/21/2014   Basal cell papilloma 11/21/2014   Avitaminosis D 11/21/2014   Social History   Tobacco Use   Smoking status: Former    Packs/day: 0.91    Years: 33.00    Total pack years: 30.03    Types: Cigarettes    Quit date: 04/19/2018    Years since quitting: 3.8   Smokeless tobacco: Never  Substance Use Topics   Alcohol use: Yes    Comment: rarely   Allergies  Allergen Reactions   Other Itching    Brand name bandaids with cloth backing causes  redness and itching   Statins     Muscle weakness   Zetia [Ezetimibe]     Leg pain, upset stomach   Sulfa Antibiotics Rash   Current Meds  Medication Sig   acetaminophen (TYLENOL) 650 MG CR tablet Take 1,300 mg by mouth every 8 (eight) hours as needed for pain.   albuterol (VENTOLIN HFA) 108 (90 Base) MCG/ACT inhaler Inhale 2 puffs into the lungs every 6 (six) hours as  needed for wheezing or shortness of breath.   Biotin 1000 MCG tablet Take 1,000 mcg by mouth daily.   Calcium Carbonate-Vit D-Min (CALCIUM 1200 PO) Take 1 tablet by mouth 2 (two) times daily.   cholecalciferol (VITAMIN D) 25 MCG (1000 UNIT) tablet Take 1,000 Units by mouth daily.   diazepam (VALIUM) 5 MG tablet Take 1 tablet (5 mg total) by mouth every 12 (twelve) hours as needed for anxiety. (Patient not taking: Reported on 10/15/2021)   hydrocortisone 2.5 % lotion Apply 1 application topically daily as needed (when washing face).   lidocaine-prilocaine (EMLA) cream Apply to affected area once   losartan (COZAAR) 100 MG tablet TAKE 1 TABLET EVERY DAY   Menthol, Topical Analgesic, (BIOFREEZE EX) Apply 1 application topically daily as needed (back pain).   mometasone (ELOCON) 0.1 % cream Apply 1 application topically daily as needed (ear irritation).   Polyethyl Glycol-Propyl Glycol (SYSTANE OP) Place 1 drop into both eyes daily as needed (dry eyes).   rosuvastatin (CRESTOR) 5 MG tablet Take 1 tablet (5 mg total) by mouth daily.   Tiotropium Bromide-Olodaterol (STIOLTO RESPIMAT) 2.5-2.5 MCG/ACT AERS Inhale 2 puffs into the lungs daily.   venlafaxine XR (EFFEXOR-XR) 75 MG 24 hr capsule TAKE 3 CAPSULES (225 MG TOTAL) BY MOUTH DAILY WITH BREAKFAST. (Patient taking differently: Take 150 mg by mouth daily with breakfast. 2 tablets at bedtime)   zoledronic acid (ZOMETA) 4 MG/5ML injection Inject 4 mg into the vein once.   [DISCONTINUED] cyanocobalamin (,VITAMIN B-12,) 1000 MCG/ML injection Inject 1 mL (1,000 mcg total) into the muscle every 30 (thirty) days.   [DISCONTINUED] letrozole (FEMARA) 2.5 MG tablet TAKE 1 TABLET BY MOUTH ONCE DAILY.   [DISCONTINUED] Syringe/Needle, Disp, (SYRINGE 3CC/25GX1") 25G X 1" 3 ML MISC 1 Syringe by Does not apply route every 30 (thirty) days.   [DISCONTINUED] Tiotropium Bromide-Olodaterol (STIOLTO RESPIMAT) 2.5-2.5 MCG/ACT AERS Inhale 2 puffs into the lungs daily.    [DISCONTINUED] Tiotropium Bromide-Olodaterol (STIOLTO RESPIMAT) 2.5-2.5 MCG/ACT AERS Inhale 2 puffs into the lungs daily.   [DISCONTINUED] triamterene-hydrochlorothiazide (DYAZIDE) 37.5-25 MG capsule TAKE 1 CAPSULE EVERY DAY   Immunization History  Administered Date(s) Administered   Fluad Quad(high Dose 65+) 09/27/2018, 10/17/2019, 11/29/2020, 10/15/2021   Hepatitis B 06/12/1995, 07/15/1995, 12/15/1995   Influenza Split 01/01/2011, 11/24/2012   Influenza, High Dose Seasonal PF 11/22/2014, 10/23/2015, 11/12/2016, 12/08/2017   Influenza,inj,Quad PF,6+ Mos 11/13/2013   Influenza,inj,quad, With Preservative 10/05/2015   Moderna Sars-Covid-2 Vaccination 03/02/2019, 03/30/2019   Pneumococcal Conjugate-13 10/11/2014   Pneumococcal Polysaccharide-23 11/08/2008, 01/24/2016   Pneumococcal-Unspecified 01/04/2015   Td 06/21/2004   Tdap 03/23/2014   Zoster, Live 11/13/2013       Objective:   Physical Exam BP 126/74 (BP Location: Left Arm, Cuff Size: Normal)   Pulse 94   Temp 98 F (36.7 C) (Temporal)   Ht '5\' 3"'$  (1.6 m)   Wt 235 lb (106.6 kg)   SpO2 95%   BMI 41.63 kg/m  GENERAL: Obese woman, no acute distress, ambulatory with assistance assistance of a cane HEAD: Normocephalic,  atraumatic.  EYES: Pupils equal, round, reactive to light.  No scleral icterus.  MOUTH: Oral mucosa moist, no thrush. NECK: Supple. No thyromegaly. Trachea midline. No JVD.  No adenopathy. PULMONARY: Good air entry bilaterally.  Coarse, otherwise, no adventitious sounds. CARDIOVASCULAR: S1 and S2. Regular rate and rhythm.  No rubs, murmurs or gallops heard. ABDOMEN: Obese, otherwise benign. MUSCULOSKELETAL: No joint deformity, no clubbing, no edema.  NEUROLOGIC: Walks with assistance of a cane.  Otherwise nonfocal. SKIN: Intact,warm,dry. PSYCH: Somewhat anxious mood, normal behavior.       Assessment & Plan:     ICD-10-CM   1. Stage 2 moderate COPD by GOLD classification (HCC)  J44.9    Continue Stiolto  2 puffs daily Continue as needed albuterol    2. SOB (shortness of breath)  R06.02    Due to poorly compensated COPD  Markedly improved with Stiolto    3. Chronic bronchitis, unspecified chronic bronchitis type ([place])  J42    No recent exacerbation    4. Malignant neoplasm of upper-outer quadrant of left breast in female, estrogen receptor positive ([place])  C50.412    Z17.0    This issue adds complexity to her management Follows with oncology     Meds ordered this encounter  Medications   Tiotropium Bromide-Olodaterol (STIOLTO RESPIMAT) 2.5-2.5 MCG/ACT AERS    Sig: Inhale 2 puffs into the lungs daily.    Dispense:  12 g    Refill:  4   Tiotropium Bromide-Olodaterol (STIOLTO RESPIMAT) 2.5-2.5 MCG/ACT AERS    Sig: Inhale 2 puffs into the lungs daily.    Dispense:  4 g    Refill:  0    Order Specific Question:   Lot Number?    Answer:   [id number]    Order Specific Question:   Expiration Date?    Answer:   07/05/2023    Order Specific Question:   Quantity    Answer:   2   Will see the patient in follow-up in 3 months time she is to contact us prior to that time should any new difficulties arise.  Renold Don, MD Advanced Bronchoscopy PCCM [facility]    *This note was dictated using voice recognition software/Dragon.  Despite best efforts to proofread, errors can occur which can change the meaning. Any transcriptional errors that result from this process are unintentional and may not be fully corrected at the time of dictation.

## 2021-08-14 NOTE — Patient Instructions (Signed)
We have sent the prescription for Stiolto 2 your mail order pharmacy.  We have provided you with some samples.   We will see you in follow-up in 3 months time call sooner should any new problems arise.

## 2021-08-19 DIAGNOSIS — M5416 Radiculopathy, lumbar region: Secondary | ICD-10-CM | POA: Diagnosis not present

## 2021-08-21 DIAGNOSIS — M5416 Radiculopathy, lumbar region: Secondary | ICD-10-CM | POA: Diagnosis not present

## 2021-08-26 ENCOUNTER — Other Ambulatory Visit: Payer: Self-pay

## 2021-08-26 DIAGNOSIS — M5416 Radiculopathy, lumbar region: Secondary | ICD-10-CM | POA: Diagnosis not present

## 2021-08-28 ENCOUNTER — Encounter: Payer: Self-pay | Admitting: Radiation Oncology

## 2021-08-28 ENCOUNTER — Ambulatory Visit
Admission: RE | Admit: 2021-08-28 | Discharge: 2021-08-28 | Disposition: A | Discharge status: Home / Self Care | Payer: Medicare PPO | Source: Ambulatory Visit | Attending: Radiation Oncology | Admitting: Radiation Oncology

## 2021-08-28 DIAGNOSIS — Z170 Estrogen receptor positive status [ER+]: Secondary | ICD-10-CM | POA: Diagnosis not present

## 2021-08-28 DIAGNOSIS — Z08 Encounter for follow-up examination after completed treatment for malignant neoplasm: Secondary | ICD-10-CM | POA: Diagnosis not present

## 2021-08-28 DIAGNOSIS — C50412 Malignant neoplasm of upper-outer quadrant of left female breast: Secondary | ICD-10-CM | POA: Diagnosis not present

## 2021-08-28 NOTE — Progress Notes (Signed)
Radiation Oncology Follow up Note  Name: Victoria Dean   Date:   08/28/2021 MRN:  [id number] DOB: [birth year]    This 71 y.o. 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  REFERRING PROVIDER: GJerrol Banana,*  HPI: Patient is a 71year old female now out 6 months having completed radiation therapy.  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  Seen today in routine follow-up she is doing well specifically denies breast tenderness cough or bone pain she is having no swelling in her left upper extremity.  She had mammograms in May which I have reviewed were BI-RADS 2 benign.  She is currently on Femara tolerating it well without side effect.  COMPLICATIONS OF TREATMENT: none  FOLLOW UP COMPLIANCE: keeps appointments   PHYSICAL EXAM:  BP (P) 124/73 (BP Location: Right Arm, Patient Position: Sitting)   Pulse (P) 96   Resp (P) 18   Wt (P) 235 lb (106.6 kg)   BMI (P) 41.63 kg/m  Lungs are clear to A&P cardiac examination essentially unremarkable with regular rate and rhythm. No dominant mass or nodularity is noted in either breast in 2 positions examined. Incision is well-healed. No axillary or supraclavicular adenopathy is appreciated. Cosmetic result is excellent.  No evidence of lymphedema is noted.  Well-developed well-nourished patient in NAD. HEENT reveals PERLA, EOMI, discs not visualized.  Oral cavity is clear. No oral mucosal lesions are identified. Neck is clear without evidence of cervical or supraclavicular adenopathy. Lungs are clear to A&P. Cardiac examination is essentially unremarkable with regular rate and rhythm without murmur rub or thrill. Abdomen is benign with no organomegaly or masses noted. Motor sensory and DTR levels are equal and symmetric in the upper and lower  extremities. Cranial nerves II through XII are grossly intact. Proprioception is intact. No peripheral adenopathy or edema is identified. No motor or sensory levels are noted. Crude visual fields are within normal range.  RADIOLOGY RESULTS: Mammograms reviewed compatible with above-stated findings  PLAN: Present time patient is doing well 6 months out with no evidence of disease I am pleased with her overall progress.  Of asked to see her back in 6 months for follow-up.  She continues on Femara without side effect.  Patient is to call with any concerns.  I would like to take this opportunity to thank you for allowing me to participate in the care of your patient..Noreene Filbert MD

## 2021-08-29 ENCOUNTER — Other Ambulatory Visit: Payer: Self-pay

## 2021-09-03 ENCOUNTER — Other Ambulatory Visit: Payer: Self-pay

## 2021-09-16 DIAGNOSIS — L820 Inflamed seborrheic keratosis: Secondary | ICD-10-CM | POA: Diagnosis not present

## 2021-09-16 DIAGNOSIS — L570 Actinic keratosis: Secondary | ICD-10-CM | POA: Diagnosis not present

## 2021-09-16 DIAGNOSIS — L298 Other pruritus: Secondary | ICD-10-CM | POA: Diagnosis not present

## 2021-09-16 DIAGNOSIS — M5416 Radiculopathy, lumbar region: Secondary | ICD-10-CM | POA: Diagnosis not present

## 2021-09-16 DIAGNOSIS — X32XXXA Exposure to sunlight, initial encounter: Secondary | ICD-10-CM | POA: Diagnosis not present

## 2021-09-16 DIAGNOSIS — D2261 Melanocytic nevi of right upper limb, including shoulder: Secondary | ICD-10-CM | POA: Diagnosis not present

## 2021-09-16 DIAGNOSIS — D2271 Melanocytic nevi of right lower limb, including hip: Secondary | ICD-10-CM | POA: Diagnosis not present

## 2021-09-16 DIAGNOSIS — L538 Other specified erythematous conditions: Secondary | ICD-10-CM | POA: Diagnosis not present

## 2021-09-16 DIAGNOSIS — Z85828 Personal history of other malignant neoplasm of skin: Secondary | ICD-10-CM | POA: Diagnosis not present

## 2021-09-16 DIAGNOSIS — D2262 Melanocytic nevi of left upper limb, including shoulder: Secondary | ICD-10-CM | POA: Diagnosis not present

## 2021-10-15 ENCOUNTER — Ambulatory Visit (INDEPENDENT_AMBULATORY_CARE_PROVIDER_SITE_OTHER): Payer: Medicare PPO | Admitting: Family Medicine

## 2021-10-15 ENCOUNTER — Encounter: Payer: Self-pay | Admitting: Family Medicine

## 2021-10-15 VITALS — BP 130/68 | HR 103 | Resp 16 | Wt 237.0 lb

## 2021-10-15 DIAGNOSIS — M5416 Radiculopathy, lumbar region: Secondary | ICD-10-CM | POA: Diagnosis not present

## 2021-10-15 DIAGNOSIS — Z23 Encounter for immunization: Secondary | ICD-10-CM

## 2021-10-15 DIAGNOSIS — Z9889 Other specified postprocedural states: Secondary | ICD-10-CM | POA: Diagnosis not present

## 2021-10-15 DIAGNOSIS — C50412 Malignant neoplasm of upper-outer quadrant of left female breast: Secondary | ICD-10-CM | POA: Diagnosis not present

## 2021-10-15 DIAGNOSIS — F330 Major depressive disorder, recurrent, mild: Secondary | ICD-10-CM | POA: Diagnosis not present

## 2021-10-15 DIAGNOSIS — E538 Deficiency of other specified B group vitamins: Secondary | ICD-10-CM

## 2021-10-15 DIAGNOSIS — E785 Hyperlipidemia, unspecified: Secondary | ICD-10-CM

## 2021-10-15 DIAGNOSIS — Z6839 Body mass index (BMI) 39.0-39.9, adult: Secondary | ICD-10-CM

## 2021-10-15 DIAGNOSIS — I10 Essential (primary) hypertension: Secondary | ICD-10-CM

## 2021-10-15 DIAGNOSIS — C50919 Malignant neoplasm of unspecified site of unspecified female breast: Secondary | ICD-10-CM | POA: Diagnosis not present

## 2021-10-15 DIAGNOSIS — Z170 Estrogen receptor positive status [ER+]: Secondary | ICD-10-CM

## 2021-10-15 DIAGNOSIS — Z853 Personal history of malignant neoplasm of breast: Secondary | ICD-10-CM | POA: Diagnosis not present

## 2021-10-15 NOTE — Progress Notes (Signed)
Established patient visit  I,[name],acting as a scribe for Victoria Durie, MD.,have documented all relevant documentation on the behalf of Victoria Durie, MD,as directed by  Victoria Durie, MD while in the presence of Victoria Durie, MD.   Patient: Victoria Dean   DOB: 1950-02-24   71 y.o. Female  MRN: [id number] Visit Date: 10/15/2021  Today's healthcare provider: [name], MD   Chief Complaint  Patient presents with   Follow-up   Hypertension   Subjective    HPI  Patient comes in today for follow-up.  Overall she is feeling fairly well.  She is in remission for her breast cancer.  Is on Zometa for osteoporosis.  Femara for the breast cancer.  Stiolto was started by pulmonology for breathing problems but it has not helped at all. Home sleep study and had no OSA she is not on CPAP.  Hypertension, follow-up  BP Readings from Last 3 Encounters:  10/15/21 130/68  08/28/21 (P) 124/73  08/14/21 126/74   Wt Readings from Last 3 Encounters:  10/15/21 237 lb (107.5 kg)  08/28/21 (P) 235 lb (106.6 kg)  08/14/21 235 lb (106.6 kg)     She was last seen for hypertension 5 months ago.  Management since that visit includes; Home blood pressures are good..   Outside blood pressures are 120/70.  Pertinent labs Lab Results  Component Value Date   CHOL 151 02/23/2020   HDL 53 02/23/2020   LDLCALC 77 02/23/2020   TRIG 119 02/23/2020   CHOLHDL 2.8 02/23/2020   Lab Results  Component Value Date   NA 136 07/10/2021   K 3.4 (L) 07/10/2021   CREATININE 0.89 07/10/2021   GFRNONAA >60 07/10/2021   GLUCOSE 99 07/10/2021   TSH 0.538 01/19/2021     The 10-year ASCVD risk score (Arnett DK, et al., 2019) is: 15.1%  ---------------------------------------------------------------------------------------------------   Medications: Outpatient Medications Prior to Visit  Medication Sig   acetaminophen (TYLENOL) 650 MG CR tablet Take 1,300 mg by  mouth every 8 (eight) hours as needed for pain.   albuterol (VENTOLIN HFA) 108 (90 Base) MCG/ACT inhaler Inhale 2 puffs into the lungs every 6 (six) hours as needed for wheezing or shortness of breath.   Biotin 1000 MCG tablet Take 1,000 mcg by mouth daily.   Calcium Carbonate-Vit D-Min (CALCIUM 1200 PO) Take by mouth.   cholecalciferol (VITAMIN D) 25 MCG (1000 UNIT) tablet Take 1,000 Units by mouth daily.   cyanocobalamin (,VITAMIN B-12,) 1000 MCG/ML injection Inject 1 mL (1,000 mcg total) into the muscle every 30 (thirty) days.   hydrocortisone 2.5 % lotion Apply 1 application topically daily as needed (when washing face).   letrozole (FEMARA) 2.5 MG tablet TAKE 1 TABLET BY MOUTH ONCE DAILY.   lidocaine-prilocaine (EMLA) cream Apply to affected area once   losartan (COZAAR) 100 MG tablet TAKE 1 TABLET EVERY DAY   Menthol, Topical Analgesic, (BIOFREEZE EX) Apply 1 application topically daily as needed (back pain).   mometasone (ELOCON) 0.1 % cream Apply 1 application topically daily as needed (ear irritation).   Polyethyl Glycol-Propyl Glycol (SYSTANE OP) Place 1 drop into both eyes daily as needed (dry eyes).   rosuvastatin (CRESTOR) 5 MG tablet Take 1 tablet (5 mg total) by mouth daily.   Syringe/Needle, Disp, (SYRINGE 3CC/25GX1") 25G X 1" 3 ML MISC 1 Syringe by Does not apply route every 30 (thirty) days.   Tiotropium Bromide-Olodaterol (STIOLTO RESPIMAT) 2.5-2.5 MCG/ACT AERS Inhale  2 puffs into the lungs daily.   Tiotropium Bromide-Olodaterol (STIOLTO RESPIMAT) 2.5-2.5 MCG/ACT AERS Inhale 2 puffs into the lungs daily.   Tiotropium Bromide-Olodaterol (STIOLTO RESPIMAT) 2.5-2.5 MCG/ACT AERS Inhale 2 puffs into the lungs daily.   triamterene-hydrochlorothiazide (DYAZIDE) 37.5-25 MG capsule TAKE 1 CAPSULE EVERY DAY   venlafaxine XR (EFFEXOR-XR) 75 MG 24 hr capsule TAKE 3 CAPSULES (225 MG TOTAL) BY MOUTH DAILY WITH BREAKFAST. (Patient taking differently: Take 150 mg by mouth daily with breakfast.  2 tablets at bedtime)   zoledronic acid (ZOMETA) 4 MG/5ML injection Inject 4 mg into the vein once.   diazepam (VALIUM) 5 MG tablet Take 1 tablet (5 mg total) by mouth every 12 (twelve) hours as needed for anxiety. (Patient not taking: Reported on 10/15/2021)   No facility-administered medications prior to visit.    Review of Systems  Constitutional:  Negative for appetite change, chills, fatigue and fever.  Respiratory:  Negative for chest tightness and shortness of breath.   Cardiovascular:  Negative for chest pain and palpitations.  Gastrointestinal:  Negative for abdominal pain, nausea and vomiting.  Neurological:  Negative for dizziness and weakness.    Last hemoglobin A1c No results found for: "HGBA1C"     Objective    BP 130/68 (BP Location: Right Arm, Patient Position: Sitting, Cuff Size: Large)   Pulse (!) 103   Resp 16   Wt 237 lb (107.5 kg)   SpO2 94%   BMI 41.98 kg/m  BP Readings from Last 3 Encounters:  10/16/21 93/60  10/15/21 130/68  08/28/21 (P) 124/73   Wt Readings from Last 3 Encounters:  10/16/21 240 lb (108.9 kg)  10/15/21 237 lb (107.5 kg)  08/28/21 (P) 235 lb (106.6 kg)      Physical Exam Vitals reviewed.  Constitutional:      General: She is not in acute distress.    Appearance: She is well-developed. She is obese.  HENT:     Head: Normocephalic and atraumatic.     Right Ear: Hearing normal.     Left Ear: Hearing normal.     Nose: Nose normal.  Eyes:     General: Lids are normal. No scleral icterus.       Right eye: No discharge.        Left eye: No discharge.     Conjunctiva/sclera: Conjunctivae normal.  Cardiovascular:     Rate and Rhythm: Normal rate and regular rhythm.     Heart sounds: Normal heart sounds.  Pulmonary:     Effort: Pulmonary effort is normal. No respiratory distress.  Skin:    Findings: No lesion or rash.  Neurological:     General: No focal deficit present.     Mental Status: She is alert and oriented to person,  place, and time.  Psychiatric:        Mood and Affect: Mood normal.        Speech: Speech normal.        Behavior: Behavior normal.        Thought Content: Thought content normal.        Judgment: Judgment normal.       No results found for any visits on 10/15/21.  Assessment & Plan     1. Essential (primary) hypertension Controlled - Hemoglobin A1c - TSH - Lipid panel  2. Need for influenza vaccination  - Flu Vaccine QUAD High Dose(Fluad)  3. Malignant neoplasm of female breast, unspecified estrogen receptor status, unspecified laterality, unspecified site of breast ([place]) On  Femara - Hemoglobin A1c - TSH - Lipid panel  4. Hyperlipidemia, unspecified hyperlipidemia type  - Hemoglobin A1c - TSH - Lipid panel  5. Class 2 severe obesity due to excess calories with serious comorbidity and body mass index (BMI) of 39.0 to 39.9 in adult [facility]) I think some of her breathing issues would improve with weight loss.  6. Lumbar radiculopathy Chronic pain  7. Malignant neoplasm of upper-outer quadrant of left breast in female, estrogen receptor positive ([place])   8. Mild episode of recurrent major depressive disorder (HCC) On venlafaxine for years  9. History of lumbar laminectomy   10. B12 deficiency    No follow-ups on file.      I, Victoria Durie, MD, have reviewed all documentation for this visit. The documentation on 10/18/21 for the exam, diagnosis, procedures, and orders are all accurate and complete.    Tenecia Ignasiak Cranford Mon, MD  [facility] [phone] (phone) [phone] (fax)  [place]

## 2021-10-16 ENCOUNTER — Inpatient Hospital Stay: Payer: Medicare PPO | Attending: Radiation Oncology

## 2021-10-16 ENCOUNTER — Encounter: Payer: Self-pay | Admitting: Oncology

## 2021-10-16 ENCOUNTER — Inpatient Hospital Stay: Payer: Medicare PPO | Admitting: Oncology

## 2021-10-16 ENCOUNTER — Inpatient Hospital Stay: Payer: Medicare PPO

## 2021-10-16 VITALS — BP 93/60 | HR 102 | Temp 98.4°F | Resp 16 | Ht 63.0 in | Wt 240.0 lb

## 2021-10-16 DIAGNOSIS — C50412 Malignant neoplasm of upper-outer quadrant of left female breast: Secondary | ICD-10-CM | POA: Insufficient documentation

## 2021-10-16 DIAGNOSIS — Z853 Personal history of malignant neoplasm of breast: Secondary | ICD-10-CM | POA: Diagnosis not present

## 2021-10-16 DIAGNOSIS — E538 Deficiency of other specified B group vitamins: Secondary | ICD-10-CM

## 2021-10-16 DIAGNOSIS — Z170 Estrogen receptor positive status [ER+]: Secondary | ICD-10-CM | POA: Insufficient documentation

## 2021-10-16 DIAGNOSIS — Z79811 Long term (current) use of aromatase inhibitors: Secondary | ICD-10-CM | POA: Insufficient documentation

## 2021-10-16 DIAGNOSIS — Z08 Encounter for follow-up examination after completed treatment for malignant neoplasm: Secondary | ICD-10-CM | POA: Diagnosis not present

## 2021-10-16 DIAGNOSIS — Z79899 Other long term (current) drug therapy: Secondary | ICD-10-CM | POA: Diagnosis not present

## 2021-10-16 DIAGNOSIS — Z5181 Encounter for therapeutic drug level monitoring: Secondary | ICD-10-CM | POA: Diagnosis not present

## 2021-10-16 DIAGNOSIS — Z7983 Long term (current) use of bisphosphonates: Secondary | ICD-10-CM

## 2021-10-16 LAB — COMPREHENSIVE METABOLIC PANEL
ALT: 27 U/L (ref 0–44)
AST: 28 U/L (ref 15–41)
Albumin: 4 g/dL (ref 3.5–5.0)
Alkaline Phosphatase: 125 U/L (ref 38–126)
Anion gap: 9 (ref 5–15)
BUN: 26 mg/dL — ABNORMAL HIGH (ref 8–23)
CO2: 26 mmol/L (ref 22–32)
Calcium: 9.3 mg/dL (ref 8.9–10.3)
Chloride: 103 mmol/L (ref 98–111)
Creatinine, Ser: 1.23 mg/dL — ABNORMAL HIGH (ref 0.44–1.00)
GFR, Estimated: 47 mL/min — ABNORMAL LOW (ref >60)
Glucose, Bld: 112 mg/dL — ABNORMAL HIGH (ref 70–99)
Potassium: 3.5 mmol/L (ref 3.5–5.1)
Sodium: 138 mmol/L (ref 135–145)
Total Bilirubin: 0.5 mg/dL (ref 0.3–1.2)
Total Protein: 7.3 g/dL (ref 6.5–8.1)

## 2021-10-16 LAB — CBC WITH DIFFERENTIAL/PLATELET
Abs Immature Granulocytes: 0.03 10*3/uL (ref 0.00–0.07)
Basophils Absolute: 0.1 10*3/uL (ref 0.0–0.1)
Basophils Relative: 1 %
Eosinophils Absolute: 0.1 10*3/uL (ref 0.0–0.5)
Eosinophils Relative: 2 %
HCT: 35.7 % — ABNORMAL LOW (ref 36.0–46.0)
Hemoglobin: 11.8 g/dL — ABNORMAL LOW (ref 12.0–15.0)
Immature Granulocytes: 0 %
Lymphocytes Relative: 24 %
Lymphs Abs: 1.9 10*3/uL (ref 0.7–4.0)
MCH: 30 pg (ref 26.0–34.0)
MCHC: 33.1 g/dL (ref 30.0–36.0)
MCV: 90.8 fL (ref 80.0–100.0)
Monocytes Absolute: 0.8 10*3/uL (ref 0.1–1.0)
Monocytes Relative: 10 %
Neutro Abs: 4.8 10*3/uL (ref 1.7–7.7)
Neutrophils Relative %: 63 %
Platelets: 288 10*3/uL (ref 150–400)
RBC: 3.93 MIL/uL (ref 3.87–5.11)
RDW: 15.2 % (ref 11.5–15.5)
WBC: 7.7 10*3/uL (ref 4.0–10.5)
nRBC: 0 % (ref 0.0–0.2)

## 2021-10-16 LAB — MAGNESIUM: Magnesium: 2.2 mg/dL (ref 1.7–2.4)

## 2021-10-16 MED ORDER — ZOLEDRONIC ACID 4 MG/100ML IV SOLN
4.0000 mg | Freq: Once | INTRAVENOUS | Status: AC
  Administered 2021-10-16: 4 mg via INTRAVENOUS
  Filled 2021-10-16: qty 100

## 2021-10-16 MED ORDER — HEPARIN SOD (PORK) LOCK FLUSH 100 UNIT/ML IV SOLN
500.0000 [IU] | Freq: Once | INTRAVENOUS | Status: AC | PRN
  Administered 2021-10-16: 500 [IU]
  Filled 2021-10-16: qty 5

## 2021-10-16 MED ORDER — SODIUM CHLORIDE 0.9 % IV SOLN
Freq: Once | INTRAVENOUS | Status: AC
  Filled 2021-10-16: qty 250

## 2021-10-16 MED ORDER — LETROZOLE 2.5 MG PO TABS: 2.5000 mg | ORAL_TABLET | Freq: Every day | ORAL | 2 refills | Status: DC

## 2021-10-16 NOTE — Progress Notes (Signed)
Creatinine 1.23, per Dr. Janese Banks okay to proceed with Zometa, pt to increase fluid intake over the next few days. Pt verbalizes understanding.

## 2021-10-17 ENCOUNTER — Encounter: Payer: Self-pay | Admitting: Oncology

## 2021-10-17 NOTE — Progress Notes (Signed)
Hematology/Oncology Consult note [facility]  Telephone:([phone] [phone]  Patient Care Team: Jerrol Banana., MD as PCP - General (Family Medicine) Lorelee Cover., MD as Consulting Physician (Ophthalmology) Dimmig, Marcello Moores, MD as Referring Physician (Orthopedic Surgery) Oneta Rack, MD (Dermatology) Dasher, Rayvon Char, MD (Dermatology) Sindy Guadeloupe, MD as Consulting Physician (Hematology and Oncology) Noreene Filbert, MD as Consulting Physician (Radiation Oncology) Bary Castilla [name], MD as Consulting Physician (General Surgery)   Name of the patient: Victoria Dean  [id number]  Oct 06, 1950   Date of visit: 10/17/21  Diagnosis- pathological prognostic stage Ib invasive lobular carcinoma of the left breast [id number] ER/PR positive HER2 negative  Chief complaint/ Reason for visit-routine follow-up of breast cancer and to receive Zometa  Heme/Onc history: Patient is a 71 year old female with a past medical history significant for claustrophobia, hypertension hypercholesterolemia, depression among other medical problems.  Patient began to notice retraction of her left nipple.  This was followed by a diagnostic mammogram which showed vague shadowing in the upper outer quadrant of the left breast without a definitive mass.  Benign cysts in the lower left breast measuring 1.5 cm.  No evidence of malignancy in the right breast.  No evidence of abnormal left axillary lymph nodes.  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   Bilateral breast MRI showed 7 x 6 x 4 cm area of non-mass enhancement in the upper outer left breast at 1 o'clock position.  This involves anterior and middle third of the breast and extension into the posterior third.  2 inferior left axillary  lymph nodes with mild cortical thickening.   PET CT scan showed 1.7 cm hypermetabolic nodule in the midline of the neck which could represent thyroglossal cyst but malignancy cannot be excluded. Patient underwent left-sided lumpectomy with nipple excision.     Final pathology showed invasive lobular carcinoma grade 210.5 cm with associated DCIS.  Skin/nipple are present and involved.  Invasive carcinoma invades the nipple without skin ulceration.  Lymphovascular invasion present.  Margins negative.  3 out of 4 lymph nodes positive for malignancy 11 mm largest deposit with extranodal extension.  pT3 pN1 a cM0   Patient was taken back for axillary dissection given 3 lymph nodes positive with extranodal extension and 4 more lymph nodes out of 9 were positive with extranodal extension   Given cardiovascular comorbidities and patient age none anthracycline adjuvant chemotherapy options were discussed.  Patient did not wish to proceed with TC chemotherapy.  She completed 12 cycles of weekly Taxol chemotherapy in October 2022 followed by adjuvant radiation therapy.    Interval history-patient has baseline fatigue and exertional shortness of breath which has remained stable.  She continues to take letrozole without any significant side effects  ECOG PS- 2 Pain scale- 0   Review of systems- Review of Systems  Constitutional:  Positive for malaise/fatigue. Negative for chills, fever and weight loss.  HENT:  Negative for congestion, ear discharge and nosebleeds.   Eyes:  Negative for blurred vision.  Respiratory:  Negative for cough, hemoptysis, sputum production, shortness of breath and wheezing.   Cardiovascular:  Negative for chest pain, palpitations, orthopnea and claudication.  Gastrointestinal:  Negative for abdominal pain, blood in stool, constipation, diarrhea, heartburn, melena, nausea and vomiting.  Genitourinary:  Negative for dysuria, flank  pain, frequency, hematuria and urgency.   Musculoskeletal:  Negative for back pain, joint pain and myalgias.  Skin:  Negative for rash.  Neurological:  Negative for dizziness, tingling, focal weakness, seizures, weakness and headaches.  Endo/Heme/Allergies:  Does not bruise/bleed easily.  Psychiatric/Behavioral:  Negative for depression and suicidal ideas. The patient does not have insomnia.       Allergies  Allergen Reactions   Other Itching    Brand name bandaids with cloth backing causes redness and itching   Statins     Muscle weakness   Zetia [Ezetimibe]     Leg pain, upset stomach   Sulfa Antibiotics Rash     Past Medical History:  Diagnosis Date   Anemia    Breast cancer ([place]) 04/2020   left   Cataract    Dyspnea    Dysrhythmia    Emphysema of lung ([place])    Family history of breast cancer    Family history of stomach cancer    Hypercholesteremia    Hypertension    Major depressive disorder    Personal history of chemotherapy    Personal history of radiation therapy    PVC (premature ventricular contraction)    sporatic   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    Vitamin D deficiency      Past Surgical History:  Procedure Laterality Date   ABDOMINAL HYSTERECTOMY  [place]   BREAST BIOPSY Right 1998   neg   BREAST BIOPSY Left 05/30/2020   stere bx x clip path pending   BREAST LUMPECTOMY WITH SENTINEL LYMPH NODE BIOPSY Left 06/29/2020   Procedure: BREAST LUMPECTOMY WITH SENTINEL LYMPH NODE BX;  Surgeon: Robert Bellow, MD;  Location: [facility];  Service: General;  Laterality: Left;   ESI     LUMBAR MICRODISCECTOMY  2019   L3-4   LUMBAR MICRODISCECTOMY  2008   L4-L5 repeated   MICRODISCECTOMY LUMBAR  2008   L4-5   MICRODISCECTOMY LUMBAR  2010   L4-L5 repeated   NODE DISSECTION Left 08/13/2020   Procedure: AXILLARY NODE DISSECTION;  Surgeon: Robert Bellow, MD;  Location: [facility];  Service: General;  Laterality: Left;    PORTACATH PLACEMENT Right 09/05/2020   Procedure: INSERTION PORT-A-CATH;  Surgeon: Robert Bellow, MD;  Location: [facility] OR[facility];  Service: General;  Laterality: Right;  right; monitor anesthesia care   SQUAMOUS CELL CARCINOMA EXCISION     TONSILLECTOMY  1971    Social History   Socioeconomic History   Marital status: Widowed    Spouse name: Not on file   Number of children: 1   Years of education: Not on file   Highest education level: Bachelor's degree (e.g., BA, AB, BS)  Occupational History   Occupation: retired  Tobacco Use   Smoking status: Former    Packs/day: 0.91    Years: 33.00    Total pack years: 30.03    Types: Cigarettes    Quit date: 04/19/2018    Years since quitting: 3.4   Smokeless tobacco: Never  Vaping Use   Vaping Use: Never used  Substance and Sexual Activity   Alcohol use: Yes    Comment: rarely   Drug use: No   Sexual activity: Not Currently  Other Topics Concern   Not on file  Social History Narrative   Not on file   Social Determinants of Health   Financial Resource Strain:  Low Risk  (02/16/2018)   Overall Financial Resource Strain (CARDIA)    Difficulty of Paying Living Expenses: Not hard at all  Food Insecurity: No Food Insecurity (02/16/2018)   Hunger Vital Sign    Worried About Running Out of Food in the Last Year: Never true    Ran Out of Food in the Last Year: Never true  Transportation Needs: No Transportation Needs (02/16/2018)   PRAPARE - Hydrologist (Medical): No    Lack of Transportation (Non-Medical): No  Physical Activity: Inactive (02/16/2018)   Exercise Vital Sign    Days of Exercise per Week: 0 days    Minutes of Exercise per Session: 0 min  Stress: No Stress Concern Present (02/16/2018)   [place]    Feeling of Stress : Only a little  Social Connections: Unknown (02/16/2018)   Social Connection and Isolation Panel [NHANES]     Frequency of Communication with Friends and Family: Patient refused    Frequency of Social Gatherings with Friends and Family: Patient refused    Attends Religious Services: Patient refused    Active Member of Clubs or Organizations: Patient refused    Attends Archivist Meetings: Patient refused    Marital Status: Patient refused  Intimate Partner Violence: Unknown (02/16/2018)   Humiliation, Afraid, Rape, and Kick questionnaire    Fear of Current or Ex-Partner: Patient refused    Emotionally Abused: Patient refused    Physically Abused: Patient refused    Sexually Abused: Patient refused    Family History  Problem Relation Age of Onset   Breast cancer Mother 60   Osteosarcoma Mother        d. 52   Hypertension Brother    COPD Father    Hypertension Father    Breast cancer Paternal Aunt        dx 74s   Breast cancer Paternal Grandmother        dx 54s   Stomach cancer Paternal Grandfather        dx 81s   Breast cancer Paternal Aunt        dx 52s   Brain cancer Paternal Aunt        dx 59s     Current Outpatient Medications:    acetaminophen (TYLENOL) 650 MG CR tablet, Take 1,300 mg by mouth every 8 (eight) hours as needed for pain., Disp: , Rfl:    albuterol (VENTOLIN HFA) 108 (90 Base) MCG/ACT inhaler, Inhale 2 puffs into the lungs every 6 (six) hours as needed for wheezing or shortness of breath., Disp: , Rfl:    Biotin 1000 MCG tablet, Take 1,000 mcg by mouth daily., Disp: , Rfl:    Calcium Carbonate-Vit D-Min (CALCIUM 1200 PO), Take 1 tablet by mouth 2 (two) times daily., Disp: , Rfl:    cholecalciferol (VITAMIN D) 25 MCG (1000 UNIT) tablet, Take 1,000 Units by mouth daily., Disp: , Rfl:    cyanocobalamin (,VITAMIN B-12,) 1000 MCG/ML injection, Inject 1 mL (1,000 mcg total) into the muscle every 30 (thirty) days., Disp: 1 mL, Rfl: 11   hydrocortisone 2.5 % lotion, Apply 1 application topically daily as needed (when washing face)., Disp: , Rfl:    letrozole  (FEMARA) 2.5 MG tablet, Take 1 tablet (2.5 mg total) by mouth daily., Disp: 30 tablet, Rfl: 2   lidocaine-prilocaine (EMLA) cream, Apply to affected area once, Disp: 30 g, Rfl: 3   losartan (COZAAR) 100 MG  tablet, TAKE 1 TABLET EVERY DAY, Disp: 90 tablet, Rfl: 1   Menthol, Topical Analgesic, (BIOFREEZE EX), Apply 1 application topically daily as needed (back pain)., Disp: , Rfl:    mometasone (ELOCON) 0.1 % cream, Apply 1 application topically daily as needed (ear irritation)., Disp: , Rfl:    Polyethyl Glycol-Propyl Glycol (SYSTANE OP), Place 1 drop into both eyes daily as needed (dry eyes)., Disp: , Rfl:    rosuvastatin (CRESTOR) 5 MG tablet, Take 1 tablet (5 mg total) by mouth daily., Disp: 90 tablet, Rfl: 3   Syringe/Needle, Disp, (SYRINGE 3CC/25GX1") 25G X 1" 3 ML MISC, 1 Syringe by Does not apply route every 30 (thirty) days., Disp: 12 each, Rfl: 0   Tiotropium Bromide-Olodaterol (STIOLTO RESPIMAT) 2.5-2.5 MCG/ACT AERS, Inhale 2 puffs into the lungs daily., Disp: 12 g, Rfl: 4   triamterene-hydrochlorothiazide (DYAZIDE) 37.5-25 MG capsule, TAKE 1 CAPSULE EVERY DAY, Disp: 90 capsule, Rfl: 1   venlafaxine XR (EFFEXOR-XR) 75 MG 24 hr capsule, TAKE 3 CAPSULES (225 MG TOTAL) BY MOUTH DAILY WITH BREAKFAST. (Patient taking differently: Take 150 mg by mouth daily with breakfast. 2 tablets at bedtime), Disp: 270 capsule, Rfl: 1   zoledronic acid (ZOMETA) 4 MG/5ML injection, Inject 4 mg into the vein once., Disp: , Rfl:    diazepam (VALIUM) 5 MG tablet, Take 1 tablet (5 mg total) by mouth every 12 (twelve) hours as needed for anxiety. (Patient not taking: Reported on 10/15/2021), Disp: 60 tablet, Rfl: 5  Physical exam:  Vitals:   10/16/21 1356  BP: 93/60  Pulse: (!) 102  Resp: 16  Temp: 98.4 F (36.9 C)  TempSrc: Oral  Weight: 240 lb (108.9 kg)  Height: _0  (1.6 m)   Physical Exam Constitutional:      General: She is not in acute distress. Cardiovascular:     Rate and Rhythm: Normal rate  and regular rhythm.     Heart sounds: Normal heart sounds.  Pulmonary:     Effort: Pulmonary effort is normal.     Breath sounds: Normal breath sounds.  Abdominal:     General: Bowel sounds are normal.     Palpations: Abdomen is soft.  Skin:    General: Skin is warm and dry.  Neurological:     Mental Status: She is alert and oriented to person, place, and time.   Breast exam: No palpable masses in the right breast.  No palpable bilateral axillary adenopathy.  Patient is s/p left mastectomy without reconstruction.  No evidence of chest wall recurrence     Latest Ref Rng & Units 10/16/2021   12:54 PM  CMP  Glucose 70 - 99 mg/dL 112   BUN 8 - 23 mg/dL 26   Creatinine 0.44 - 1.00 mg/dL 1.23   Sodium 135 - 145 mmol/L 138   Potassium 3.5 - 5.1 mmol/L 3.5   Chloride 98 - 111 mmol/L 103   CO2 22 - 32 mmol/L 26   Calcium 8.9 - 10.3 mg/dL 9.3   Total Protein 6.5 - 8.1 g/dL 7.3   Total Bilirubin 0.3 - 1.2 mg/dL 0.5   Alkaline Phos 38 - 126 U/L 125   AST 15 - 41 U/L 28   ALT 0 - 44 U/L 27       Latest Ref Rng & Units 10/16/2021   12:54 PM  CBC  WBC 4.0 - 10.5 K/uL 7.7   Hemoglobin 12.0 - 15.0 g/dL 11.8   Hematocrit 36.0 - 46.0 % 35.7   Platelets  150 - 400 K/uL 288     Assessment and plan- Patient is a 71 y.o. 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  She is s/p 12 weekly cycles of Taxol chemotherapy and adjuvant radiation treatment.  She is here for routine follow-up  Clinically patient is doing well with no concerning signs and symptoms of recurrence based on today's exam.  Patient will continue to take letrozole along with calcium and vitamin D.    Patient also has high risk breast cancer and therefore receiving adjuvant Zometa.  She will get her next dose today.  Plan is to give it every 3 months to complete 2 years of treatment.  History of B12 deficiency continue monthly B12  injections at home   Visit Diagnosis 1. B12 deficiency   2. Encounter for follow-up surveillance of breast cancer   3. Use of letrozole (Femara)      Dr. Randa Evens, MD, MPH [facility] [phone] 10/17/2021 12:05 PM

## 2021-10-18 ENCOUNTER — Telehealth: Payer: Self-pay

## 2021-10-18 NOTE — Telephone Encounter (Signed)
Copied from [place] [phone]. Topic: General - Other >> Oct 18, 2021 12:55 PM Victoria Dean wrote: Pt inquiring when she should fu w/ the new provider  Pleas assist further

## 2021-10-22 DIAGNOSIS — E785 Hyperlipidemia, unspecified: Secondary | ICD-10-CM | POA: Diagnosis not present

## 2021-10-22 DIAGNOSIS — I10 Essential (primary) hypertension: Secondary | ICD-10-CM | POA: Diagnosis not present

## 2021-10-22 DIAGNOSIS — C50919 Malignant neoplasm of unspecified site of unspecified female breast: Secondary | ICD-10-CM | POA: Diagnosis not present

## 2021-10-23 ENCOUNTER — Ambulatory Visit: Payer: Self-pay

## 2021-10-23 LAB — LIPID PANEL
Chol/HDL Ratio: 3.3 ratio (ref 0.0–4.4)
Cholesterol, Total: 170 mg/dL (ref 100–199)
HDL: 51 mg/dL (ref >39)
LDL Chol Calc (NIH): 94 mg/dL (ref 0–99)
Triglycerides: 142 mg/dL (ref 0–149)
VLDL Cholesterol Cal: 25 mg/dL (ref 5–40)

## 2021-10-23 LAB — HEMOGLOBIN A1C
Est. average glucose Bld gHb Est-mCnc: 131 mg/dL
Hgb A1c MFr Bld: 6.2 % — ABNORMAL HIGH (ref 4.8–5.6)

## 2021-10-23 LAB — TSH: TSH: 0.762 u[IU]/mL (ref 0.450–4.500)

## 2021-10-23 NOTE — Patient Outreach (Signed)
  Care Coordination   Initial Visit Note   10/23/2021 Name: Victoria Dean MRN: [id number] DOB: 19-Jul-1950  Victoria Dean is a 71 y.o. year old female who sees Jerrol Banana., MD for primary care. I spoke with  Victoria Dean by phone today.  What matters to the patients health and wellness today?  Getting her cataracts removed. Has an eye appointment on 10-28-2021    Goals Addressed             This Visit's Progress    RNCM: Maintain Health and Well being       Care Coordination Interventions: Evaluation of current treatment plan related to cataract removal from eyes, and maintaining health and well being from chronic conditions  and patient's adherence to plan as established by provider Advised patient to call the office for changes in conditions, questions, or concerns Provided education to patient re: the goals of the care corrdination program and to call the [facility] for new needs, concerns, questions, or educational needs. Reviewed medications with patient and discussed the patient states that the inhalers cause her to have palpitations. She is going to discuss this at her upcoming visit with the pulmonary provider.  Reviewed scheduled/upcoming provider appointments including 11-14-2021 with pulmonary provider Discussed plans with patient for ongoing care management follow up and provided patient with direct contact information for care management team Advised patient to discuss her concerns about the inhalers causing palpitations and other questions and concerns with provider Screening for signs and symptoms of depression related to chronic disease state  Assessed social determinant of health barriers The patient agrees to outreaches from the [facility] Review of the goals of care and services of care coordination. Review of resources and provided the patient with the [facility] phone number. The patient knows to call for changes, needs or help with any education or resource needs.            SDOH assessments and interventions completed:  Yes  SDOH Interventions Today    Flowsheet Row Most Recent Value  SDOH Interventions   Food Insecurity Interventions Intervention Not Indicated  Housing Interventions Intervention Not Indicated  Transportation Interventions Intervention Not Indicated  Utilities Interventions Intervention Not Indicated  Alcohol Usage Interventions Intervention Not Indicated (Score <7)  Financial Strain Interventions Intervention Not Indicated  Social Connections Interventions Intervention Not Indicated        Care Coordination Interventions Activated:  Yes  Care Coordination Interventions:  Yes, provided   Follow up plan: Follow up call scheduled for 12-11-2021 at 1030 am    Encounter Outcome:  Pt. Visit Completed   Noreene Larsson RN, MSN, [place]  Mobile: [phone]

## 2021-10-23 NOTE — Patient Instructions (Signed)
Visit Information  Thank you for taking time to visit with me today. Please don't hesitate to contact me if I can be of assistance to you.   Following are the goals we discussed today:   Goals Addressed             This Visit's Progress    RNCM: Maintain Health and Well being       Care Coordination Interventions: Evaluation of current treatment plan related to cataract removal from eyes, and maintaining health and well being from chronic conditions  and patient's adherence to plan as established by provider Advised patient to call the office for changes in conditions, questions, or concerns Provided education to patient re: the goals of the care corrdination program and to call the [facility] for new needs, concerns, questions, or educational needs. Reviewed medications with patient and discussed the patient states that the inhalers cause her to have palpitations. She is going to discuss this at her upcoming visit with the pulmonary provider.  Reviewed scheduled/upcoming provider appointments including 11-14-2021 with pulmonary provider Discussed plans with patient for ongoing care management follow up and provided patient with direct contact information for care management team Advised patient to discuss her concerns about the inhalers causing palpitations and other questions and concerns with provider Screening for signs and symptoms of depression related to chronic disease state  Assessed social determinant of health barriers The patient agrees to outreaches from the [facility]. Review of the goals of care and services of care coordination. Review of resources and provided the patient with the [facility] phone number. The patient knows to call for changes, needs or help with any education or resource needs.           Our next appointment is by telephone on 12-11-2021 at 1030 am   Please call the care guide team at [phone] if you need to cancel or reschedule your appointment.   If you are  experiencing a Mental Health or [place] or need someone to talk to, please call the Suicide and Crisis Lifeline: 988 call the [place] National Suicide Prevention Lifeline: (510)835-5288 or TTY: (818)612-1076 TTY 4238005339) to talk to a trained counselor call 1-800-273-TALK (toll free, 24 hour hotline)  Patient verbalizes understanding of instructions and care plan provided today and agrees to view in [place]. Active MyChart status and patient understanding of how to access instructions and care plan via MyChart confirmed with patient.     Telephone follow up appointment with care management team member scheduled for: 12-11-2021 at 56 am  [place], MSN, [place]  Mobile: [phone]

## 2021-10-24 ENCOUNTER — Other Ambulatory Visit: Payer: Self-pay | Admitting: Family Medicine

## 2021-10-24 DIAGNOSIS — I10 Essential (primary) hypertension: Secondary | ICD-10-CM

## 2021-10-28 ENCOUNTER — Telehealth: Payer: Self-pay | Admitting: Family Medicine

## 2021-10-28 NOTE — Telephone Encounter (Signed)
Patient advised.

## 2021-10-28 NOTE — Telephone Encounter (Signed)
Patient is wanting someone to call her about her lab results from 10/22/21. She does not look at [facility] and would like to talk to someone.

## 2021-11-05 DIAGNOSIS — Z452 Encounter for adjustment and management of vascular access device: Secondary | ICD-10-CM | POA: Diagnosis not present

## 2021-11-05 DIAGNOSIS — Z853 Personal history of malignant neoplasm of breast: Secondary | ICD-10-CM | POA: Diagnosis not present

## 2021-11-14 ENCOUNTER — Ambulatory Visit: Payer: Medicare PPO | Admitting: Pulmonary Disease

## 2021-11-18 ENCOUNTER — Ambulatory Visit: Payer: Medicare PPO | Admitting: Pulmonary Disease

## 2021-11-18 VITALS — BP 124/78 | HR 97 | Temp 98.0°F | Ht 63.0 in | Wt 245.8 lb

## 2021-11-18 DIAGNOSIS — C50412 Malignant neoplasm of upper-outer quadrant of left female breast: Secondary | ICD-10-CM | POA: Diagnosis not present

## 2021-11-18 DIAGNOSIS — J449 Chronic obstructive pulmonary disease, unspecified: Secondary | ICD-10-CM | POA: Diagnosis not present

## 2021-11-18 DIAGNOSIS — Z170 Estrogen receptor positive status [ER+]: Secondary | ICD-10-CM

## 2021-11-18 DIAGNOSIS — R0602 Shortness of breath: Secondary | ICD-10-CM | POA: Diagnosis not present

## 2021-11-18 NOTE — Patient Instructions (Signed)
As we discussed, you are going to do a short trial off of the Stiolto to see if this is truly making a difference.  If it does you are to resume the [place].   We will see you in follow-up 6 months time call sooner should any new problems arise.

## 2021-11-18 NOTE — Progress Notes (Signed)
Subjective:    Patient ID: Victoria Dean, female    DOB: Mar 20, 1950, 71 y.o.   MRN: [id number] Patient Care Team: Maple Hudson., MD as PCP - General (Family Medicine) Irene Limbo., MD as Consulting Physician (Ophthalmology) Dimmig, Maisie Fus, MD as Referring Physician (Orthopedic Surgery) Debbrah Alar, MD (Dermatology) Dasher, Cliffton Asters, MD (Dermatology) Creig Hines, MD as Consulting Physician (Hematology and Oncology) Carmina Miller, MD as Consulting Physician (Radiation Oncology) Lemar Livings Merrily Pew, MD as Consulting Physician (General Surgery) Marlowe Sax, RN as Case Manager (General Practice)  Chief Complaint  Patient presents with   Follow-up    SOB with exertion. No wheezing or cough.     HPI The patient is 71 year old former smoker (quit 2018, 30 PY) who presents for follow-up of dyspnea with activity and exertion.  We initially saw her on 19 February 2021.  She had been noticing dyspnea since approximately July 2022.  This is a scheduled visit, she was previously seen on 14 August 2021.  Previously the patient had declined PFTs due to severe claustrophobia.  We performed office spirometry on 11 Jun 2021 she was noted to have moderate to severe airway obstruction and was given a trial of Stiolto 2 inhalations daily.  Initially the patient noted that this inhaler has been helping her tremendously, now she does not see any help with the inhaler.  Even with thi,  she hardly has need for her albuterol for rescue.  She has not had any fevers, chills or sweats.  No cough or sputum production.  No lower extremity edema or calf tenderness.    DATA 06/11/2021 office spirometry: FEV1 1.0 L or 46% predicted, FVC 1.6 L or 88% predicted, FEV1/FVC 60%, FEF 25-75 severely decreased at 30%.Consistent with moderate to severe airway obstruction.   Review of Systems A 10 point review of systems was performed and it is as noted above otherwise negative.  Patient Active Problem List    Diagnosis Date Noted   Lumbar radiculopathy 08/14/2021   Acute on chronic respiratory failure with hypoxia (HCC) 01/19/2021   Upper respiratory infection with cough and congestion 01/17/2021   B12 deficiency 10/09/2020   Genetic testing 06/26/2020   Family history of breast cancer    Family history of stomach cancer    Malignant neoplasm of upper-outer quadrant of left breast in female, estrogen receptor positive (HCC) 06/10/2020   Goals of care, counseling/discussion 06/10/2020   Impingement syndrome of right shoulder region 05/06/2019   Low back pain 01/24/2019   Personal history of tobacco use, presenting hazards to health 02/23/2018   History of lumbar laminectomy 04/30/2016   Actinic keratosis 11/21/2014   Adaptation reaction 11/21/2014   Allergic rhinitis 11/21/2014   Cornu cutaneum 11/21/2014   Clinical depression 11/21/2014   Dermatitis, eczematoid 11/21/2014   Enthesopathy 11/21/2014   Essential (primary) hypertension 11/21/2014   Anxiety, generalized 11/21/2014   Herniated nucleus pulposus 11/21/2014   Mild episode of recurrent major depressive disorder (HCC) 11/21/2014   Adiposity 11/21/2014   Arthritis, degenerative 11/21/2014   Basal cell papilloma 11/21/2014   Avitaminosis D 11/21/2014   Social History   Tobacco Use   Smoking status: Former    Packs/day: 0.91    Years: 33.00    Total pack years: 30.03    Types: Cigarettes    Quit date: 04/19/2018    Years since quitting: 3.5   Smokeless tobacco: Never  Substance Use Topics   Alcohol use: Yes    Comment:  rarely   Allergies  Allergen Reactions   Other Itching    Brand name bandaids with cloth backing causes redness and itching   Statins     Muscle weakness   Zetia [Ezetimibe]     Leg pain, upset stomach   Sulfa Antibiotics Rash   Current Meds  Medication Sig   acetaminophen (TYLENOL) 650 MG CR tablet Take 1,300 mg by mouth every 8 (eight) hours as needed for pain.   albuterol (VENTOLIN HFA) 108  (90 Base) MCG/ACT inhaler Inhale 2 puffs into the lungs every 6 (six) hours as needed for wheezing or shortness of breath.   Biotin 1000 MCG tablet Take 1,000 mcg by mouth daily.   Calcium Carbonate-Vit D-Min (CALCIUM 1200 PO) Take 1 tablet by mouth 2 (two) times daily.   cholecalciferol (VITAMIN D) 25 MCG (1000 UNIT) tablet Take 1,000 Units by mouth daily.   cyanocobalamin (,VITAMIN B-12,) 1000 MCG/ML injection Inject 1 mL (1,000 mcg total) into the muscle every 30 (thirty) days.   famotidine (PEPCID) 10 MG tablet Take by mouth. Take 10 mg by mouth 2 (two) times daily as needed for Heartburn   hydrocortisone 2.5 % lotion Apply 1 application topically daily as needed (when washing face).   letrozole (FEMARA) 2.5 MG tablet Take 1 tablet (2.5 mg total) by mouth daily.   lidocaine-prilocaine (EMLA) cream Apply to affected area once   loratadine (CLARITIN) 10 MG tablet Take by mouth. Take 10 mg by mouth once daily   losartan (COZAAR) 100 MG tablet TAKE 1 TABLET EVERY DAY   Menthol, Topical Analgesic, (BIOFREEZE EX) Apply 1 application topically daily as needed (back pain).   mometasone (ELOCON) 0.1 % cream Apply 1 application topically daily as needed (ear irritation).   Polyethyl Glycol-Propyl Glycol (SYSTANE OP) Place 1 drop into both eyes daily as needed (dry eyes).   rosuvastatin (CRESTOR) 5 MG tablet Take 1 tablet (5 mg total) by mouth daily.   Syringe/Needle, Disp, (SYRINGE 3CC/25GX1") 25G X 1" 3 ML MISC 1 Syringe by Does not apply route every 30 (thirty) days.   Tiotropium Bromide-Olodaterol (STIOLTO RESPIMAT) 2.5-2.5 MCG/ACT AERS Inhale 2 puffs into the lungs daily.   triamterene-hydrochlorothiazide (DYAZIDE) 37.5-25 MG capsule TAKE 1 CAPSULE EVERY DAY   venlafaxine XR (EFFEXOR-XR) 75 MG 24 hr capsule TAKE 3 CAPSULES (225 MG TOTAL) BY MOUTH DAILY WITH BREAKFAST. (Patient taking differently: Take 150 mg by mouth daily with breakfast. 2 tablets at bedtime)   zoledronic acid (ZOMETA) 4 MG/5ML  injection Inject 4 mg into the vein once.   Immunization History  Administered Date(s) Administered   Fluad Quad(high Dose 65+) 09/27/2018, 10/17/2019, 11/29/2020, 10/15/2021   Hepatitis B 06/12/1995, 07/15/1995, 12/15/1995   Influenza Split 01/01/2011, 11/24/2012   Influenza, High Dose Seasonal PF 11/22/2014, 10/23/2015, 11/12/2016, 12/08/2017   Influenza,inj,Quad PF,6+ Mos 11/13/2013   Influenza,inj,quad, With Preservative 10/05/2015   Moderna Sars-Covid-2 Vaccination 03/02/2019, 03/30/2019   Pneumococcal Conjugate-13 10/11/2014   Pneumococcal Polysaccharide-23 11/08/2008, 01/24/2016   Pneumococcal-Unspecified 01/04/2015   Td 06/21/2004   Tdap 03/23/2014   Zoster, Live 11/13/2013       Objective:   Physical Exam  GENERAL: Obese woman, no acute distress, ambulatory with assistance assistance of a cane HEAD: Normocephalic, atraumatic.  EYES: Pupils equal, round, reactive to light.  No scleral icterus.  MOUTH: Oral mucosa moist, no thrush. NECK: Supple. No thyromegaly. Trachea midline. No JVD.  No adenopathy. PULMONARY: Good air entry bilaterally.  Coarse, otherwise, no adventitious sounds. CARDIOVASCULAR: S1 and S2. Regular rate  and rhythm.  No rubs, murmurs or gallops heard. ABDOMEN: Obese, otherwise benign. MUSCULOSKELETAL: No joint deformity, no clubbing, no edema.  NEUROLOGIC: Walks with assistance of a cane.  Otherwise nonfocal. SKIN: Intact,warm,dry. PSYCH: Somewhat anxious mood, normal behavior  Ambulatory oximetry was performed today: At rest heart rate was 96 bpm, oxygen saturation 93%, at less than 250 feet ambulation heart rate went up to 121 bpm and steadily increased with subsequent ambulation 233 bpm.  Oxygen saturation nadir was noted on the first lap of the study at 90% but then increased to baseline and maintained at 92 to 93% throughout the rest of the study.  Patient complained of shortness of breath of moderate degree during ambulation.     Assessment &  Plan:     ICD-10-CM   1. 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 Shortness of breath  R06.02    Suspect multifactorial Cardiac/COPD/obesity/deconditioning Letrozole could add to symptomatology    3. Malignant neoplasm of upper-outer quadrant of left breast in female, estrogen receptor positive (HCC)  C50.412    Z17.0    Issue adds complexity to her management Follows with oncology     The patient is ambulatory oximetry was suggestive of cardiac disease/deconditioning, recommended that she discuss with primary care.  Amended overnight oximetry to exclude nocturnal hypoxemia however the patient declines.  The patient is adamant about trying to be off of Stiolto recommended that she do a trial off of the medication but that if she sees worsening of her symptoms of shortness of breath to resume the medication.  We will see her in follow-up in 6 months time she is to contact us prior to that time should any new difficulties arise.   Gailen Shelter, MD Advanced Bronchoscopy PCCM [facility]    *This note was dictated using voice recognition software/Dragon.  Despite best efforts to proofread, errors can occur which can change the meaning. Any transcriptional errors that result from this process are unintentional and may not be fully corrected at the time of dictation.

## 2021-12-11 ENCOUNTER — Ambulatory Visit: Payer: Self-pay | Admitting: *Deleted

## 2021-12-11 NOTE — Patient Outreach (Signed)
  Care Coordination   12/11/2021 Name: DUANE TRIAS MRN: [id number] DOB: [birth year]   Care Coordination Outreach Attempts:  An unsuccessful telephone outreach was attempted for a scheduled appointment today.  Follow Up Plan:  Additional outreach attempts will be made to offer the patient care coordination information and services.   Encounter Outcome:  No Answer  Care Coordination Interventions Activated:  No   Care Coordination Interventions:  No, not indicated    Valente David, RN, MSN, [facility] Care Management Care Management Coordinator [phone]

## 2021-12-11 NOTE — Patient Outreach (Signed)
  Care Coordination   Follow Up Visit Note   12/11/2021 Name: Victoria Dean MRN: [id number] DOB: 10/21/1950  Victoria Dean is a 71 y.o. year old female who sees Jerrol Banana., MD for primary care. I spoke with  Gerald Stabs by phone today.  What matters to the patients health and wellness today?  Patient is no longer active with [facility].  PCP is now with [facility] in [place].  No further needs identified.     Goals Addressed             This Visit's Progress    COMPLETED: RNCM: Maintain Health and Well being       Care Coordination Interventions: Evaluation of current treatment plan related to cataract removal from eyes, and maintaining health and well being from chronic conditions  and patient's adherence to plan as established by provider Advised patient to call the office for changes in conditions, questions, or concerns Provided education to patient re: the goals of the care corrdination program and to call the [facility] for new needs, concerns, questions, or educational needs. Reviewed medications with patient and discussed the patient states that the inhalers cause her to have palpitations. She is going to discuss this at her upcoming visit with the pulmonary provider.  Reviewed scheduled/upcoming provider appointments including 11-14-2021 with pulmonary provider Discussed plans with patient for ongoing care management follow up and provided patient with direct contact information for care management team Advised patient to discuss her concerns about the inhalers causing palpitations and other questions and concerns with provider Screening for signs and symptoms of depression related to chronic disease state  Assessed social determinant of health barriers The patient agrees to outreaches from the [facility]. Review of the goals of care and services of care coordination. Review of resources and provided the patient with the [facility] phone number. The patient knows to  call for changes, needs or help with any education or resource needs.           SDOH assessments and interventions completed:  No     Care Coordination Interventions Activated:  No  Care Coordination Interventions:  No, not indicated   Follow up plan: No further intervention required.   Encounter Outcome:  Pt. Visit Completed   Valente David, RN, MSN, [facility] Care Management Care Management Coordinator [phone]

## 2021-12-24 ENCOUNTER — Other Ambulatory Visit: Payer: Self-pay | Admitting: *Deleted

## 2021-12-24 MED ORDER — CYANOCOBALAMIN 1000 MCG/ML IJ SOLN: 1000.0000 ug | INTRAMUSCULAR | 4 refills | Status: DC

## 2021-12-31 ENCOUNTER — Other Ambulatory Visit: Payer: Self-pay | Admitting: *Deleted

## 2021-12-31 MED ORDER — "SYRINGE 25G X 1"" 3 ML MISC": 1.0000 | 0 refills | Status: DC

## 2021-12-31 MED ORDER — CYANOCOBALAMIN 1000 MCG/ML IJ SOLN: 1000.0000 ug | INTRAMUSCULAR | 4 refills | Status: DC

## 2022-01-08 ENCOUNTER — Encounter (HOSPITAL_COMMUNITY): Payer: Medicare PPO

## 2022-01-15 ENCOUNTER — Inpatient Hospital Stay: Payer: Medicare PPO

## 2022-01-15 ENCOUNTER — Encounter: Payer: Self-pay | Admitting: Oncology

## 2022-01-15 ENCOUNTER — Inpatient Hospital Stay: Payer: Medicare PPO | Attending: Radiation Oncology

## 2022-01-15 ENCOUNTER — Inpatient Hospital Stay (HOSPITAL_BASED_OUTPATIENT_CLINIC_OR_DEPARTMENT_OTHER): Payer: Medicare PPO | Admitting: Oncology

## 2022-01-15 VITALS — BP 147/79 | HR 96 | Temp 97.3°F | Wt 252.0 lb

## 2022-01-15 DIAGNOSIS — Z853 Personal history of malignant neoplasm of breast: Secondary | ICD-10-CM

## 2022-01-15 DIAGNOSIS — Z7983 Long term (current) use of bisphosphonates: Secondary | ICD-10-CM

## 2022-01-15 DIAGNOSIS — Z79811 Long term (current) use of aromatase inhibitors: Secondary | ICD-10-CM | POA: Diagnosis not present

## 2022-01-15 DIAGNOSIS — Z170 Estrogen receptor positive status [ER+]: Secondary | ICD-10-CM | POA: Insufficient documentation

## 2022-01-15 DIAGNOSIS — Z08 Encounter for follow-up examination after completed treatment for malignant neoplasm: Secondary | ICD-10-CM

## 2022-01-15 DIAGNOSIS — C773 Secondary and unspecified malignant neoplasm of axilla and upper limb lymph nodes: Secondary | ICD-10-CM | POA: Insufficient documentation

## 2022-01-15 DIAGNOSIS — Z79899 Other long term (current) drug therapy: Secondary | ICD-10-CM | POA: Diagnosis not present

## 2022-01-15 DIAGNOSIS — Z5181 Encounter for therapeutic drug level monitoring: Secondary | ICD-10-CM | POA: Diagnosis not present

## 2022-01-15 DIAGNOSIS — C50412 Malignant neoplasm of upper-outer quadrant of left female breast: Secondary | ICD-10-CM | POA: Insufficient documentation

## 2022-01-15 DIAGNOSIS — E538 Deficiency of other specified B group vitamins: Secondary | ICD-10-CM

## 2022-01-15 LAB — COMPREHENSIVE METABOLIC PANEL
ALT: 27 U/L (ref 0–44)
AST: 25 U/L (ref 15–41)
Albumin: 3.6 g/dL (ref 3.5–5.0)
Alkaline Phosphatase: 104 U/L (ref 38–126)
Anion gap: 8 (ref 5–15)
BUN: 28 mg/dL — ABNORMAL HIGH (ref 8–23)
CO2: 27 mmol/L (ref 22–32)
Calcium: 8.9 mg/dL (ref 8.9–10.3)
Chloride: 104 mmol/L (ref 98–111)
Creatinine, Ser: 0.96 mg/dL (ref 0.44–1.00)
GFR, Estimated: >60 mL/min (ref >60)
Glucose, Bld: 111 mg/dL — ABNORMAL HIGH (ref 70–99)
Potassium: 3.8 mmol/L (ref 3.5–5.1)
Sodium: 139 mmol/L (ref 135–145)
Total Bilirubin: 0.2 mg/dL — ABNORMAL LOW (ref 0.3–1.2)
Total Protein: 7.1 g/dL (ref 6.5–8.1)

## 2022-01-15 LAB — CBC WITH DIFFERENTIAL/PLATELET
Abs Immature Granulocytes: 0.02 10*3/uL (ref 0.00–0.07)
Basophils Absolute: 0 10*3/uL (ref 0.0–0.1)
Basophils Relative: 1 %
Eosinophils Absolute: 0.2 10*3/uL (ref 0.0–0.5)
Eosinophils Relative: 2 %
HCT: 34.8 % — ABNORMAL LOW (ref 36.0–46.0)
Hemoglobin: 11.2 g/dL — ABNORMAL LOW (ref 12.0–15.0)
Immature Granulocytes: 0 %
Lymphocytes Relative: 30 %
Lymphs Abs: 2.1 10*3/uL (ref 0.7–4.0)
MCH: 29.9 pg (ref 26.0–34.0)
MCHC: 32.2 g/dL (ref 30.0–36.0)
MCV: 93 fL (ref 80.0–100.0)
Monocytes Absolute: 0.7 10*3/uL (ref 0.1–1.0)
Monocytes Relative: 11 %
Neutro Abs: 3.9 10*3/uL (ref 1.7–7.7)
Neutrophils Relative %: 56 %
Platelets: 231 10*3/uL (ref 150–400)
RBC: 3.74 MIL/uL — ABNORMAL LOW (ref 3.87–5.11)
RDW: 14.9 % (ref 11.5–15.5)
WBC: 6.9 10*3/uL (ref 4.0–10.5)
nRBC: 0 % (ref 0.0–0.2)

## 2022-01-15 MED ORDER — SODIUM CHLORIDE 0.9 % IV SOLN
Freq: Once | INTRAVENOUS | Status: AC
  Filled 2022-01-15: qty 250

## 2022-01-15 MED ORDER — ZOLEDRONIC ACID 4 MG/100ML IV SOLN
4.0000 mg | Freq: Once | INTRAVENOUS | Status: AC
  Administered 2022-01-15: 4 mg via INTRAVENOUS
  Filled 2022-01-15: qty 100

## 2022-01-19 ENCOUNTER — Encounter: Payer: Self-pay | Admitting: Oncology

## 2022-01-19 NOTE — Progress Notes (Signed)
Hematology/Oncology Consult note [facility]  [phone] [phone]  Patient Care Team: Ludwig Clarks, FNP as PCP - General (Family Medicine) Lorelee Cover., MD as Consulting Physician (Ophthalmology) Dimmig, Marcello Moores, MD as Referring Physician (Orthopedic Surgery) Oneta Rack, MD (Dermatology) Dasher, Rayvon Char, MD (Dermatology) Sindy Guadeloupe, MD as Consulting Physician (Hematology and Oncology) Noreene Filbert, MD as Consulting Physician (Radiation Oncology) Bary Castilla [name], MD as Consulting Physician (General Surgery)   Name of the patient: Victoria Dean  [id number]  [birth year]   Date of visit: 01/19/22  Diagnosis- pathological prognostic stage Ib invasive lobular carcinoma of the left breast [id number] ER/PR positive HER2 negative   Chief complaint/ Reason for visit-routine follow-up of breast cancer on letrozole  Heme/Onc history: Patient is a 71 year old female with a past medical history significant for claustrophobia, hypertension hypercholesterolemia, depression among other medical problems.  Patient began to notice retraction of her left nipple.  This was followed by a diagnostic mammogram which showed vague shadowing in the upper outer quadrant of the left breast without a definitive mass.  Benign cysts in the lower left breast measuring 1.5 cm.  No evidence of malignancy in the right breast.  No evidence of abnormal left axillary lymph nodes.  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   Bilateral breast MRI showed 7 x 6 x 4 cm area of non-mass enhancement in the upper outer left breast at 1 o'clock position.  This involves anterior and middle third of the breast and extension into the posterior third.  2 inferior left axillary lymph nodes with  mild cortical thickening.   PET CT scan showed 1.7 cm hypermetabolic nodule in the midline of the neck which could represent thyroglossal cyst but malignancy cannot be excluded. Patient underwent left-sided lumpectomy with nipple excision.     Final pathology showed invasive lobular carcinoma grade 210.5 cm with associated DCIS.  Skin/nipple are present and involved.  Invasive carcinoma invades the nipple without skin ulceration.  Lymphovascular invasion present.  Margins negative.  3 out of 4 lymph nodes positive for malignancy 11 mm largest deposit with extranodal extension.  pT3 pN1 a cM0   Patient was taken back for axillary dissection given 3 lymph nodes positive with extranodal extension and 4 more lymph nodes out of 9 were positive with extranodal extension   Given cardiovascular comorbidities and patient age none anthracycline adjuvant chemotherapy options were discussed.  Patient did not wish to proceed with TC chemotherapy.  She completed 12 cycles of weekly Taxol chemotherapy in October 2022 followed by adjuvant radiation therapy.    Interval history-patient's quality of life presently is limited due to her shortness of breath.  She was prescribed multiple inhalers by pulmonary most of which she could not tolerate due to side effects.  She is taking her letrozole without missing any doses.  ECOG PS- 2 Pain scale- 2   Review of systems- Review of Systems  Constitutional:  Positive for malaise/fatigue. Negative for chills, fever and weight loss.  HENT:  Negative for congestion, ear discharge and nosebleeds.   Eyes:  Negative for blurred vision.  Respiratory:  Positive for shortness of breath. Negative for cough, hemoptysis, sputum production and wheezing.   Cardiovascular:  Negative for chest pain, palpitations, orthopnea and claudication.  Gastrointestinal:  Negative for abdominal  pain, blood in stool, constipation, diarrhea, heartburn, melena, nausea and vomiting.  Genitourinary:   Negative for dysuria, flank pain, frequency, hematuria and urgency.  Musculoskeletal:  Negative for back pain, joint pain and myalgias.  Skin:  Negative for rash.  Neurological:  Negative for dizziness, tingling, focal weakness, seizures, weakness and headaches.  Endo/Heme/Allergies:  Does not bruise/bleed easily.  Psychiatric/Behavioral:  Negative for depression and suicidal ideas. The patient does not have insomnia.       Allergies  Allergen Reactions   Other Itching    Brand name bandaids with cloth backing causes redness and itching   Statins     Muscle weakness   Zetia [Ezetimibe]     Leg pain, upset stomach   Sulfa Antibiotics Rash     Past Medical History:  Diagnosis Date   Anemia    Breast cancer ([place]) 04/2020   left   Cataract    Dyspnea    Dysrhythmia    Emphysema of lung ([place])    Family history of breast cancer    Family history of stomach cancer    Hypercholesteremia    Hypertension    Major depressive disorder    Personal history of chemotherapy    Personal history of radiation therapy    PVC (premature ventricular contraction)    sporatic   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    Vitamin D deficiency      Past Surgical History:  Procedure Laterality Date   ABDOMINAL HYSTERECTOMY  [place]   BREAST BIOPSY Right 1998   neg   BREAST BIOPSY Left 05/30/2020   stere bx x clip path pending   BREAST LUMPECTOMY WITH SENTINEL LYMPH NODE BIOPSY Left 06/29/2020   Procedure: BREAST LUMPECTOMY WITH SENTINEL LYMPH NODE BX;  Surgeon: Robert Bellow, MD;  Location: [facility] ORS;  Service: General;  Laterality: Left;   ESI     LUMBAR MICRODISCECTOMY  2019   L3-4   LUMBAR MICRODISCECTOMY  2008   L4-L5 repeated   MICRODISCECTOMY LUMBAR  2008   L4-5   MICRODISCECTOMY LUMBAR  2010   L4-L5 repeated   NODE DISSECTION Left 08/13/2020   Procedure: AXILLARY NODE DISSECTION;  Surgeon: Robert Bellow,  MD;  Location: [facility];  Service: General;  Laterality: Left;   PORTACATH PLACEMENT Right 09/05/2020   Procedure: INSERTION PORT-A-CATH;  Surgeon: Robert Bellow, MD;  Location: [facility] OR[facility];  Service: General;  Laterality: Right;  right; monitor anesthesia care   SQUAMOUS CELL CARCINOMA EXCISION     TONSILLECTOMY  1971    Social History   Socioeconomic History   Marital status: Widowed    Spouse name: Not on file   Number of children: 1   Years of education: Not on file   Highest education level: Bachelor's degree (e.g., BA, AB, BS)  Occupational History   Occupation: retired  Tobacco Use   Smoking status: Former    Packs/day: 0.91    Years: 33.00    Total pack years: 30.03    Types: Cigarettes    Quit date: 04/19/2018    Years since quitting: 3.7   Smokeless tobacco: Never  Vaping Use   Vaping Use: Never used  Substance and Sexual Activity   Alcohol use: Yes    Comment: rarely   Drug use: No   Sexual activity: Not Currently  Other Topics Concern   Not on file  Social  History Narrative   Not on file   Social Determinants of Health   Financial Resource Strain: Low Risk  (10/23/2021)   Overall Financial Resource Strain (CARDIA)    Difficulty of Paying Living Expenses: Not hard at all  Food Insecurity: No Food Insecurity (10/23/2021)   Hunger Vital Sign    Worried About Running Out of Food in the Last Year: Never true    Ran Out of Food in the Last Year: Never true  Transportation Needs: No Transportation Needs (10/23/2021)   PRAPARE - Hydrologist (Medical): No    Lack of Transportation (Non-Medical): No  Physical Activity: Inactive (02/16/2018)   Exercise Vital Sign    Days of Exercise per Week: 0 days    Minutes of Exercise per Session: 0 min  Stress: No Stress Concern Present (02/16/2018)   [place]    Feeling of Stress : Only a little  Social Connections: Moderately  Isolated (10/23/2021)   Social Connection and Isolation Panel [NHANES]    Frequency of Communication with Friends and Family: More than three times a week    Frequency of Social Gatherings with Friends and Family: More than three times a week    Attends Religious Services: More than 4 times per year    Active Member of [employer] or Organizations: No    Attends Archivist Meetings: Never    Marital Status: Widowed  Intimate Partner Violence: Not At Risk (10/23/2021)   Humiliation, Afraid, Rape, and Kick questionnaire    Fear of Current or Ex-Partner: No    Emotionally Abused: No    Physically Abused: No    Sexually Abused: No    Family History  Problem Relation Age of Onset   Breast cancer Mother 104   Osteosarcoma Mother        d. 68   Hypertension Brother    COPD Father    Hypertension Father    Breast cancer Paternal Aunt        dx 65s   Breast cancer Paternal Grandmother        dx 70s   Stomach cancer Paternal Grandfather        dx 84s   Breast cancer Paternal Aunt        dx 65s   Brain cancer Paternal Aunt        dx 59s     Current Outpatient Medications:    acetaminophen (TYLENOL) 650 MG CR tablet, Take 1,300 mg by mouth every 8 (eight) hours as needed for pain., Disp: , Rfl:    albuterol (VENTOLIN HFA) 108 (90 Base) MCG/ACT inhaler, Inhale 2 puffs into the lungs every 6 (six) hours as needed for wheezing or shortness of breath., Disp: , Rfl:    Biotin 1000 MCG tablet, Take 1,000 mcg by mouth daily., Disp: , Rfl:    Calcium Carbonate-Vit D-Min (CALCIUM 1200 PO), Take 1 tablet by mouth 2 (two) times daily., Disp: , Rfl:    cholecalciferol (VITAMIN D) 25 MCG (1000 UNIT) tablet, Take 1,000 Units by mouth daily., Disp: , Rfl:    cyanocobalamin (VITAMIN B12) 1000 MCG/ML injection, Inject 1 mL (1,000 mcg total) into the muscle every 30 (thirty) days., Disp: 3 mL, Rfl: 4   famotidine (PEPCID) 10 MG tablet, Take by mouth. Take 10 mg by mouth 2 (two) times daily as needed  for Heartburn, Disp: , Rfl:    hydrocortisone 2.5 % lotion, Apply 1 application  topically daily as needed (when washing face)., Disp: , Rfl:    letrozole (FEMARA) 2.5 MG tablet, Take 1 tablet (2.5 mg total) by mouth daily., Disp: 30 tablet, Rfl: 2   lidocaine-prilocaine (EMLA) cream, Apply to affected area once, Disp: 30 g, Rfl: 3   loratadine (CLARITIN) 10 MG tablet, Take by mouth. Take 10 mg by mouth once daily, Disp: , Rfl:    losartan (COZAAR) 100 MG tablet, TAKE 1 TABLET EVERY DAY, Disp: 90 tablet, Rfl: 1   Menthol, Topical Analgesic, (BIOFREEZE EX), Apply 1 application topically daily as needed (back pain)., Disp: , Rfl:    mometasone (ELOCON) 0.1 % cream, Apply 1 application topically daily as needed (ear irritation)., Disp: , Rfl:    Polyethyl Glycol-Propyl Glycol (SYSTANE OP), Place 1 drop into both eyes daily as needed (dry eyes)., Disp: , Rfl:    rosuvastatin (CRESTOR) 5 MG tablet, Take 1 tablet (5 mg total) by mouth daily., Disp: 90 tablet, Rfl: 3   Syringe/Needle, Disp, (SYRINGE 3CC/25GX1") 25G X 1" 3 ML MISC, 1 Syringe by Does not apply route every 30 (thirty) days., Disp: 12 each, Rfl: 0   Tiotropium Bromide-Olodaterol (STIOLTO RESPIMAT) 2.5-2.5 MCG/ACT AERS, Inhale 2 puffs into the lungs daily., Disp: 12 g, Rfl: 4   triamterene-hydrochlorothiazide (DYAZIDE) 37.5-25 MG capsule, TAKE 1 CAPSULE EVERY DAY, Disp: 90 capsule, Rfl: 1   venlafaxine XR (EFFEXOR-XR) 75 MG 24 hr capsule, TAKE 3 CAPSULES (225 MG TOTAL) BY MOUTH DAILY WITH BREAKFAST. (Patient taking differently: Take 150 mg by mouth daily with breakfast. 2 tablets at bedtime), Disp: 270 capsule, Rfl: 1   zoledronic acid (ZOMETA) 4 MG/5ML injection, Inject 4 mg into the vein once., Disp: , Rfl:    diazepam (VALIUM) 5 MG tablet, Take 1 tablet (5 mg total) by mouth every 12 (twelve) hours as needed for anxiety. (Patient not taking: Reported on 10/15/2021), Disp: 60 tablet, Rfl: 5  Physical exam:  Vitals:   01/15/22 1358  BP: (!)  147/79  Pulse: 96  Temp: (!) 97.3 F (36.3 C)  TempSrc: Tympanic  SpO2: 96%  Weight: 252 lb (114.3 kg)   Physical Exam Cardiovascular:     Rate and Rhythm: Normal rate and regular rhythm.     Heart sounds: Normal heart sounds.  Pulmonary:     Effort: Pulmonary effort is normal.     Comments: Breath sounds decreased over lung bases Abdominal:     General: Bowel sounds are normal.     Palpations: Abdomen is soft.  Skin:    General: Skin is warm and dry.  Neurological:     Mental Status: She is alert and oriented to person, place, and time.    Breast exam was performed in seated and lying down position. Patient is status post left lumpectomy with a well-healed surgical scar. No evidence of any palpable masses. No evidence of axillary adenopathy. No evidence of any palpable masses or lumps in the right breast. No evidence of right axillary adenopathy      Latest Ref Rng & Units 01/15/2022    1:43 PM  CMP  Glucose 70 - 99 mg/dL 111   BUN 8 - 23 mg/dL 28   Creatinine 0.44 - 1.00 mg/dL 0.96   Sodium 135 - 145 mmol/L 139   Potassium 3.5 - 5.1 mmol/L 3.8   Chloride 98 - 111 mmol/L 104   CO2 22 - 32 mmol/L 27   Calcium 8.9 - 10.3 mg/dL 8.9   Total Protein 6.5 - 8.1  g/dL 7.1   Total Bilirubin 0.3 - 1.2 mg/dL 0.2   Alkaline Phos 38 - 126 U/L 104   AST 15 - 41 U/L 25   ALT 0 - 44 U/L 27       Latest Ref Rng & Units 01/15/2022    1:43 PM  CBC  WBC 4.0 - 10.5 K/uL 6.9   Hemoglobin 12.0 - 15.0 g/dL 11.2   Hematocrit 36.0 - 46.0 % 34.8   Platelets 150 - 400 K/uL 231    Assessment and plan- Patient is a 71 y.o. 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  She is s/p 12 cycles of weekly Taxol chemotherapy and adjuvant radiation treatment.  She is presently on letrozole and this is a routine follow-up visit  Clinically patient is doing well with no concerning signs and symptoms of recurrence  based on today's exam.  Her mammogram from May 2023 was unremarkable.  Patient will continue to take letrozole along with calcium and vitamin D.  Patient is also on adjuvant Zometa which she gets every 3 months and will receive her next dose today.  Calcium levels acceptable to proceed with Zometa today.   Visit Diagnosis 1. Encounter for follow-up surveillance of breast cancer   2. Use of letrozole (Femara)   3. Encounter for monitoring zoledronic acid therapy      Dr. Randa Evens, MD, MPH [facility] [place] [phone] 01/19/2022 9:05 PM

## 2022-01-21 ENCOUNTER — Other Ambulatory Visit: Payer: Self-pay | Admitting: Oncology

## 2022-01-21 NOTE — H&P (Signed)
Surgical History & Physical  Patient Name: Victoria Dean DOB: 11/08/1950  Surgery: Cataract extraction with intraocular lens implant phacoemulsification; Left Eye  Surgeon: Baruch Goldmann MD Surgery Date:  01-31-22 Pre-Op Date:  01-20-22  HPI: A 89 Yr. old female patient present for cataract evaluation per Dr. Lorie Apley. 1. 1. The patient complains of difficulty when driving, seeing small print, and watching TV which gradually began 2 years ago. Both eyes are affected. The condition's severity is worsening. This is negatively affecting the patient's quality of life and the patient is unable to function adequately in life with the current level of vision. HPI Completed by Dr. Baruch Goldmann  Medical History: Cataracts Macula Degeneration Cancer High Blood Pressure LDL Lung Problems acid reflux, allergies  Review of Systems Cardiovascular High Blood Pressure Musculoskeletal back pain All recorded systems are negative except as noted above.  Social   Former smoker   Medication Systane Complete,  Cyanocobalamin, Famotidine, Hydrocortisone cream, Letrozole, Emla cream, Loratadine, Losartan, Momestasone, Rosuvastatin, Triamterene- HCTZ, Venlafaxine, Zoledronic Acid,   Sx/Procedures Lumpectomy + Radiation + Chemo, Back Surgery, Hysterectomy,   Drug Allergies  Statins, Sulfa, Zetia,   History & Physical: Heent: Cataract, left eye NECK: supple without bruits LUNGS: lungs clear to auscultation CV: regular rate and rhythm Abdomen: soft and non-tender Impression & Plan: Assessment: 1.  COMBINED FORMS AGE RELATED CATARACT; Both Eyes (H25.813) 2.  DERMATOCHALASIS, no surgery; Right Upper Lid, Left Upper Lid (H02.831, H02.834) 3.  BLEPHARITIS; Right Upper Lid, Right Lower Lid, Left Upper Lid, Left Lower Lid (H01.001, H01.002,H01.004,H01.005) 4.  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  ASTIGMATISM, REGULAR; Both Eyes (H52.223)  Plan: 1.  Cataract accounts for  the patient's decreased vision. This visual impairment is not correctable with a tolerable change in glasses or contact lenses. Cataract surgery with an implantation of a new lens should significantly improve the visual and functional status of the patient. Discussed all risks, benefits, alternatives, and potential complications. Discussed the procedures and recovery. Patient desires to have surgery. A-scan ordered and performed today for intra-ocular lens calculations. The surgery will be performed in order to improve vision for driving, reading, and for eye examinations. Recommend phacoemulsification with intra-ocular lens. Recommend Dextenza for post-operative pain and inflammation. Dilates poorly - shugarcaine by protocol. Omidira. Left Eye worse- first. Toric Lens.  2.  Asymptomatic, recommend observation for now. Findings, prognosis and treatment options reviewed.  3.  Recommend regular lid cleaning.  4.  Mild Discussed condition. Monitor.  5.  Recommend toric IOL OU.

## 2022-01-29 ENCOUNTER — Encounter (HOSPITAL_COMMUNITY): Payer: Self-pay

## 2022-01-29 ENCOUNTER — Encounter (HOSPITAL_COMMUNITY)
Admission: RE | Admit: 2022-01-29 | Discharge: 2022-01-29 | Disposition: A | Discharge status: Home / Self Care | Payer: Medicare PPO | Source: Ambulatory Visit | Attending: Ophthalmology | Admitting: Ophthalmology

## 2022-01-31 ENCOUNTER — Ambulatory Visit (HOSPITAL_COMMUNITY): Payer: Medicare PPO | Admitting: Anesthesiology

## 2022-01-31 ENCOUNTER — Encounter (HOSPITAL_COMMUNITY): Payer: Self-pay | Admitting: Ophthalmology

## 2022-01-31 ENCOUNTER — Encounter (HOSPITAL_COMMUNITY)
Admission: RE | Disposition: A | Discharge status: Home / Self Care | Payer: Self-pay | Source: Home / Self Care | Attending: Ophthalmology

## 2022-01-31 ENCOUNTER — Ambulatory Visit (HOSPITAL_COMMUNITY)
Admission: RE | Admit: 2022-01-31 | Discharge: 2022-01-31 | Disposition: A | Discharge status: Home / Self Care | Payer: Medicare PPO | Attending: Ophthalmology | Admitting: Ophthalmology

## 2022-01-31 DIAGNOSIS — H25812 Combined forms of age-related cataract, left eye: Secondary | ICD-10-CM

## 2022-01-31 DIAGNOSIS — H02831 Dermatochalasis of right upper eyelid: Secondary | ICD-10-CM | POA: Insufficient documentation

## 2022-01-31 DIAGNOSIS — H2512 Age-related nuclear cataract, left eye: Secondary | ICD-10-CM | POA: Diagnosis not present

## 2022-01-31 DIAGNOSIS — I10 Essential (primary) hypertension: Secondary | ICD-10-CM | POA: Diagnosis not present

## 2022-01-31 DIAGNOSIS — Z853 Personal history of malignant neoplasm of breast: Secondary | ICD-10-CM | POA: Insufficient documentation

## 2022-01-31 DIAGNOSIS — H0100B Unspecified blepharitis left eye, upper and lower eyelids: Secondary | ICD-10-CM | POA: Diagnosis not present

## 2022-01-31 DIAGNOSIS — H18519 Endothelial corneal dystrophy, unspecified eye: Secondary | ICD-10-CM | POA: Diagnosis not present

## 2022-01-31 DIAGNOSIS — H02834 Dermatochalasis of left upper eyelid: Secondary | ICD-10-CM | POA: Insufficient documentation

## 2022-01-31 DIAGNOSIS — J449 Chronic obstructive pulmonary disease, unspecified: Secondary | ICD-10-CM | POA: Insufficient documentation

## 2022-01-31 DIAGNOSIS — H52202 Unspecified astigmatism, left eye: Secondary | ICD-10-CM

## 2022-01-31 DIAGNOSIS — H0100A Unspecified blepharitis right eye, upper and lower eyelids: Secondary | ICD-10-CM | POA: Insufficient documentation

## 2022-01-31 DIAGNOSIS — Z79899 Other long term (current) drug therapy: Secondary | ICD-10-CM | POA: Diagnosis not present

## 2022-01-31 DIAGNOSIS — H52223 Regular astigmatism, bilateral: Secondary | ICD-10-CM | POA: Insufficient documentation

## 2022-01-31 DIAGNOSIS — Z87891 Personal history of nicotine dependence: Secondary | ICD-10-CM | POA: Diagnosis not present

## 2022-01-31 DIAGNOSIS — H25813 Combined forms of age-related cataract, bilateral: Secondary | ICD-10-CM | POA: Diagnosis present

## 2022-01-31 HISTORY — PX: CATARACT EXTRACTION W/PHACO: SHX586

## 2022-01-31 SURGERY — PHACOEMULSIFICATION, CATARACT, WITH IOL INSERTION
Anesthesia: Monitor Anesthesia Care | Site: Eye | Laterality: Left

## 2022-01-31 MED ORDER — TETRACAINE HCL 0.5 % OP SOLN
1.0000 [drp] | OPHTHALMIC | Status: AC | PRN
  Administered 2022-01-31 (×3): 1 [drp] via OPHTHALMIC

## 2022-01-31 MED ORDER — SODIUM HYALURONATE 23MG/ML IO SOSY
PREFILLED_SYRINGE | INTRAOCULAR | Status: DC | PRN
  Administered 2022-01-31: .6 mL via INTRAOCULAR

## 2022-01-31 MED ORDER — EPINEPHRINE PF 1 MG/ML IJ SOLN
INTRAOCULAR | Status: DC | PRN
  Administered 2022-01-31: 500 mL

## 2022-01-31 MED ORDER — MOXIFLOXACIN HCL 0.5 % OP SOLN
OPHTHALMIC | Status: DC | PRN
  Administered 2022-01-31: .2 mL via OPHTHALMIC

## 2022-01-31 MED ORDER — SODIUM HYALURONATE 10 MG/ML IO SOLUTION
PREFILLED_SYRINGE | INTRAOCULAR | Status: DC | PRN
  Administered 2022-01-31: .85 mL via INTRAOCULAR

## 2022-01-31 MED ORDER — PHENYLEPHRINE HCL 2.5 % OP SOLN
1.0000 [drp] | OPHTHALMIC | Status: DC | PRN
  Administered 2022-01-31 (×2): 1 [drp] via OPHTHALMIC

## 2022-01-31 MED ORDER — STERILE WATER FOR IRRIGATION IR SOLN
Status: DC | PRN
  Administered 2022-01-31: 250 mL

## 2022-01-31 MED ORDER — BSS IO SOLN
INTRAOCULAR | Status: DC | PRN
  Administered 2022-01-31: 15 mL via INTRAOCULAR

## 2022-01-31 MED ORDER — LIDOCAINE HCL (PF) 1 % IJ SOLN
INTRAOCULAR | Status: DC | PRN
  Administered 2022-01-31: 1 mL via OPHTHALMIC

## 2022-01-31 MED ORDER — MOXIFLOXACIN HCL 5 MG/ML IO SOLN
INTRAOCULAR | Status: AC
  Filled 2022-01-31: qty 1

## 2022-01-31 MED ORDER — MIDAZOLAM HCL 2 MG/2ML IJ SOLN
INTRAMUSCULAR | Status: AC
  Filled 2022-01-31: qty 2

## 2022-01-31 MED ORDER — EPINEPHRINE PF 1 MG/ML IJ SOLN
INTRAMUSCULAR | Status: AC
  Filled 2022-01-31: qty 2

## 2022-01-31 MED ORDER — TROPICAMIDE 1 % OP SOLN
1.0000 [drp] | OPHTHALMIC | Status: AC | PRN
  Administered 2022-01-31 (×3): 1 [drp] via OPHTHALMIC

## 2022-01-31 MED ORDER — LIDOCAINE HCL 3.5 % OP GEL: 1.0000 | Freq: Once | OPHTHALMIC | Status: DC

## 2022-01-31 MED ORDER — POVIDONE-IODINE 5 % OP SOLN
OPHTHALMIC | Status: DC | PRN
  Administered 2022-01-31: 1 via OPHTHALMIC

## 2022-01-31 MED ORDER — MIDAZOLAM HCL 2 MG/2ML IJ SOLN
INTRAMUSCULAR | Status: DC | PRN
  Administered 2022-01-31: 1 mg via INTRAVENOUS

## 2022-01-31 SURGICAL SUPPLY — 16 items
CATARACT SUITE SIGHTPATH (MISCELLANEOUS) ×1 IMPLANT
CLOTH BEACON ORANGE TIMEOUT ST (SAFETY) ×1 IMPLANT
EYE SHIELD UNIVERSAL CLEAR (GAUZE/BANDAGES/DRESSINGS) IMPLANT
FEE CATARACT SUITE SIGHTPATH (MISCELLANEOUS) ×1 IMPLANT
GLOVE BIOGEL PI IND STRL 7.0 (GLOVE) ×2 IMPLANT
LENS IOL EYHANCE TORIC II 23.5 ×1 IMPLANT
LENS IOL EYHANCE TRC 450 23.5 IMPLANT
LENS IOL EYHNC TORIC 450 23.5 ×1 IMPLANT
MARKER SKIN DUAL TIP RULER LAB (MISCELLANEOUS) IMPLANT
NDL HYPO 18GX1.5 BLUNT FILL (NEEDLE) ×1 IMPLANT
NEEDLE HYPO 18GX1.5 BLUNT FILL (NEEDLE) ×1 IMPLANT
PAD ARMBOARD 7.5X6 YLW CONV (MISCELLANEOUS) ×1 IMPLANT
SYR TB 1ML LL NO SAFETY (SYRINGE) ×1 IMPLANT
TAPE SURG TRANSPORE 1 IN (GAUZE/BANDAGES/DRESSINGS) IMPLANT
TAPE SURGICAL TRANSPORE 1 IN (GAUZE/BANDAGES/DRESSINGS) ×1
WATER STERILE IRR 250ML POUR (IV SOLUTION) ×1 IMPLANT

## 2022-01-31 NOTE — Op Note (Signed)
Date of procedure: 01/31/22  Pre-operative diagnosis: Visually significant age-related cataract, Left Eye; Visually Significant Astigmatism, Left Eye (H25.?2)  Post-operative diagnosis: Visually significant age-related cataract, Left Eye; Visually Significant Astigmatism, Left Eye  Procedure: Removal of cataract via phacoemulsification and insertion of intra-ocular lens Wynetta Emery and Johnson [id number] +23.5D into the capsular bag of the Left Eye  Attending surgeon: Gerda Diss. Nikkol Pai, MD, MA  Anesthesia: MAC, Topical Akten  Complications: None  Estimated Blood Loss: <78m (minimal)  Specimens: None  Implants: As above  Indications:  Visually significant age-related cataract, Left Eye; Visually Significant Astigmatism, Left Eye  Procedure:  The patient was seen and identified in the pre-operative area. The operative eye was identified and dilated.  The operative eye was marked.  Pre-operative toric markers were used to mark the eye at 0 and 180 degrees. Topical anesthesia was administered to the operative eye.     The patient was then to the operative suite and placed in the supine position.  A timeout was performed confirming the patient, procedure to be performed, and all other relevant information.   The patient's face was prepped and draped in the usual fashion for intra-ocular surgery.  A lid speculum was placed into the operative eye and the surgical microscope moved into place and focused.  A superotemporal paracentesis was created using a 20 gauge paracentesis blade.  Shugarcaine was injected into the anterior chamber.  Viscoelastic was injected into the anterior chamber.  A temporal clear-corneal main wound incision was created using a 2.437mmicrokeratome.  A continuous curvilinear capsulorrhexis was initiated using an irrigating cystitome and completed using capsulorrhexis forceps.  Hydrodissection and hydrodeliniation were performed.  Viscoelastic was injected into the anterior chamber.  A  phacoemulsification handpiece and a chopper as a second instrument were used to remove the nucleus and epinucleus. The irrigation/aspiration handpiece was used to remove any remaining cortical material.   The capsular bag was reinflated with viscoelastic, checked, and found to be intact.  The eye was marked to the per-op meridian.  The intraocular lens was inserted into the capsular bag and dialed into place using a Kuglen hook to 4 degrees.  The irrigation/aspiration handpiece was used to remove any remaining viscoelastic.  The clear corneal wound and paracentesis wounds were then hydrated and checked with Weck-Cels to be watertight. 0.43m60mf moxifloxacin was injected into the anterior chamber. The lid-speculum and drape was removed, and the patient's face was cleaned with a wet and dry 4x4.   A clear shield was taped over the eye. The patient was taken to the post-operative care unit in good condition, having tolerated the procedure well.  Post-Op Instructions: [facility] and will receive all other orders and instructions.

## 2022-01-31 NOTE — Interval H&P Note (Signed)
History and Physical Interval Note:  01/31/2022 10:23 AM  Victoria Dean  has presented today for surgery, with the diagnosis of combined forms age related cataract; left.  The various methods of treatment have been discussed with the patient and family. After consideration of risks, benefits and other options for treatment, the patient has consented to  Procedure(s) with comments: CATARACT EXTRACTION PHACO AND INTRAOCULAR LENS PLACEMENT ([place]) (Left) - CDE as a surgical intervention.  The patient's history has been reviewed, patient examined, no change in status, stable for surgery.  I have reviewed the patient's chart and labs.  Questions were answered to the patient's satisfaction.     Baruch Goldmann

## 2022-01-31 NOTE — Anesthesia Postprocedure Evaluation (Signed)
Anesthesia Post Note  Patient: Victoria Dean  Procedure(s) Performed: CATARACT EXTRACTION PHACO AND INTRAOCULAR LENS PLACEMENT ([facility]) (Left: Eye)  Patient location during evaluation: Phase II Anesthesia Type: MAC Level of consciousness: awake and alert and oriented Pain management: pain level controlled Vital Signs Assessment: post-procedure vital signs reviewed and stable Respiratory status: spontaneous breathing, nonlabored ventilation and respiratory function stable Cardiovascular status: blood pressure returned to baseline and stable Postop Assessment: no apparent nausea or vomiting Anesthetic complications: no  No notable events documented.   Last Vitals:  Vitals:   01/31/22 0950 01/31/22 1047  BP: (!) 140/62 (!) 144/68  Pulse: 86 82  Temp: 36.7 C 36.8 C  SpO2: 95% 96%    Last Pain:  Vitals:   01/31/22 1047  TempSrc: Oral  PainSc: 0-No pain                 [name]

## 2022-01-31 NOTE — Transfer of Care (Signed)
Immediate Anesthesia Transfer of Care Note  Patient: Victoria Dean  Procedure(s) Performed: CATARACT EXTRACTION PHACO AND INTRAOCULAR LENS PLACEMENT ([facility]) (Left: Eye)  Patient Location: Short Stay  Anesthesia Type:MAC  Level of Consciousness: awake, alert , and oriented  Airway & Oxygen Therapy: Patient Spontanous Breathing  Post-op Assessment: Report given to RN and Post -op Vital signs reviewed and stable  Post vital signs: Reviewed and stable  Last Vitals:  Vitals Value Taken Time  BP 144/68 01/31/22 1047  Temp 36.8 C 01/31/22 1047  Pulse 82 01/31/22 1047  Resp 14   SpO2 96 % 01/31/22 1047    Last Pain:  Vitals:   01/31/22 1047  TempSrc: Oral  PainSc: 0-No pain      Patients Stated Pain Goal: 7 (90/93/11 2162)  Complications: No notable events documented.

## 2022-01-31 NOTE — Anesthesia Preprocedure Evaluation (Addendum)
Anesthesia Evaluation  Patient identified by MRN, date of birth, ID band Patient awake    Reviewed: Allergy & Precautions, H&P , NPO status , Patient's Chart, lab work & pertinent test results  Airway Mallampati: III  TM Distance: >3 FB Neck ROM: Full    Dental  (+) Dental Advisory Given, Teeth Intact   Pulmonary shortness of breath, COPD, former smoker   Pulmonary exam normal breath sounds clear to auscultation       Cardiovascular hypertension, Pt. on medications Normal cardiovascular exam+ dysrhythmias (PVCs)  Rhythm:Regular Rate:Normal     Neuro/Psych  PSYCHIATRIC DISORDERS Anxiety Depression     Neuromuscular disease    GI/Hepatic negative GI ROS, Neg liver ROS,,,  Endo/Other  negative endocrine ROS    Renal/GU negative Renal ROS  negative genitourinary   Musculoskeletal  (+) Arthritis , Osteoarthritis,    Abdominal   Peds negative pediatric ROS (+)  Hematology  (+) Blood dyscrasia, anemia   Anesthesia Other Findings Left breast cancer  Reproductive/Obstetrics negative OB ROS                             Anesthesia Physical Anesthesia Plan  ASA: 3  Anesthesia Plan: MAC   Post-op Pain Management: Minimal or no pain anticipated   Induction:   PONV Risk Score and Plan: Treatment may vary due to age or medical condition  Airway Management Planned: Nasal Cannula and Natural Airway  Additional Equipment:   Intra-op Plan:   Post-operative Plan:   Informed Consent: I have reviewed the patients History and Physical, chart, labs and discussed the procedure including the risks, benefits and alternatives for the proposed anesthesia with the patient or authorized representative who has indicated his/her understanding and acceptance.     Dental advisory given  Plan Discussed with: CRNA and Surgeon  Anesthesia Plan Comments:        Anesthesia Quick Evaluation

## 2022-01-31 NOTE — Discharge Instructions (Signed)
Please discharge patient when stable, will follow up today with Dr. Kanin Lia at the [facility] office immediately following discharge.  Leave shield in place until visit.  All paperwork with discharge instructions will be given at the office.  [facility] Address:  [address]  [place], [place] [address]  

## 2022-02-07 ENCOUNTER — Encounter (HOSPITAL_COMMUNITY): Payer: Self-pay | Admitting: Ophthalmology

## 2022-02-17 ENCOUNTER — Encounter (HOSPITAL_COMMUNITY)
Admission: RE | Admit: 2022-02-17 | Discharge: 2022-02-17 | Disposition: A | Discharge status: Home / Self Care | Payer: Medicare PPO | Source: Ambulatory Visit | Attending: Ophthalmology | Admitting: Ophthalmology

## 2022-02-19 NOTE — H&P (Signed)
Surgical History & Physical  Patient Name: Victoria Dean DOB: 1950-12-18  Surgery: Cataract extraction with intraocular lens implant phacoemulsification; Right Eye  Surgeon: Baruch Goldmann MD Surgery Date:  02-21-22 Pre-Op Date:  02-13-22  HPI: A 31 Yr. old female patient 1. The patient is returning after cataract surgery. The left eye is affected. Status post cataract surgery, which began almost 2 weeks ago: Since the last visit, the affected area is doing well. The patient's vision is improved. Patient is following medication instructions. 2. 2. The patient is returning for a cataract follow-up of the right eye. Since the last visit, the affected area is worsening. The patient's vision is blurry. The complaint is associated with difficulty reading small print on medicine bottles/labels, difficulty driving at night due to halos/glare, and difficulty seeing captions on tv. This is negatively affecting the patient's quality of life and the patient is unable to function adequately in life with the current level of vision. HPI was performed by Baruch Goldmann .  Medical History: Cataracts Macula Degeneration Cancer High Blood Pressure LDL Lung Problems acid reflux, allergies  Review of Systems Cardiovascular High Blood Pressure Musculoskeletal back pain All recorded systems are negative except as noted above.  Social   Former smoker   Medication Systane Complete, Prednisolone-Moxifloxacin-Bromfenac,  Cyanocobalamin, Famotidine, Hydrocortisone cream, Letrozole, Emla cream, Loratadine, Losartan, Momestasone, Rosuvastatin, Triamterene- HCTZ, Venlafaxine, Zoledronic Acid,   Sx/Procedures Phaco c IOL OS- toric,  Lumpectomy + Radiation + Chemo, Back Surgery, Hysterectomy,   Drug Allergies  Statins, Sulfa, Zetia,   History & Physical: Heent: cataract, right eye NECK: supple without bruits LUNGS: lungs clear to auscultation CV: regular rate and rhythm Abdomen: soft and  non-tender Impression & Plan: Assessment: 1.  CATARACT EXTRACTION STATUS; Left Eye (Z98.42) 2.  COMBINED FORMS AGE RELATED CATARACT; Right Eye (H25.811) 3.  ASTIGMATISM, REGULAR; Both Eyes (H52.223)  Plan: 1.  2 weeks after cataract surgery. Doing well with improved vision and normal eye pressure. Call with any problems or concerns. Continue Pred-Moxi-Brom 2x/day for 2 more weeks.  2.  Cataract accounts for the patient's decreased vision. This visual impairment is not correctable with a tolerable change in glasses or contact lenses. Cataract surgery with an implantation of a new lens should significantly improve the visual and functional status of the patient. Discussed all risks, benefits, alternatives, and potential complications. Discussed the procedures and recovery. Patient desires to have surgery. A-scan ordered and performed today for intra-ocular lens calculations. The surgery will be performed in order to improve vision for driving, reading, and for eye examinations. Recommend phacoemulsification with intra-ocular lens. Recommend Dextenza for post-operative pain and inflammation. Right Eye. Surgery required to correct imbalance of vision. Dilates well - shugarcaine by protocol. Toric Lens.  3.  Recommend toric IOL OU.

## 2022-02-21 ENCOUNTER — Ambulatory Visit (HOSPITAL_BASED_OUTPATIENT_CLINIC_OR_DEPARTMENT_OTHER): Payer: Medicare PPO | Admitting: Certified Registered Nurse Anesthetist

## 2022-02-21 ENCOUNTER — Ambulatory Visit (HOSPITAL_COMMUNITY)
Admission: RE | Admit: 2022-02-21 | Discharge: 2022-02-21 | Disposition: A | Discharge status: Home / Self Care | Payer: Medicare PPO | Attending: Ophthalmology | Admitting: Ophthalmology

## 2022-02-21 ENCOUNTER — Ambulatory Visit (HOSPITAL_COMMUNITY): Payer: Medicare PPO | Admitting: Certified Registered Nurse Anesthetist

## 2022-02-21 ENCOUNTER — Encounter (HOSPITAL_COMMUNITY)
Admission: RE | Disposition: A | Discharge status: Home / Self Care | Payer: Self-pay | Source: Home / Self Care | Attending: Ophthalmology

## 2022-02-21 DIAGNOSIS — Z87891 Personal history of nicotine dependence: Secondary | ICD-10-CM | POA: Diagnosis not present

## 2022-02-21 DIAGNOSIS — J449 Chronic obstructive pulmonary disease, unspecified: Secondary | ICD-10-CM | POA: Insufficient documentation

## 2022-02-21 DIAGNOSIS — H52201 Unspecified astigmatism, right eye: Secondary | ICD-10-CM | POA: Insufficient documentation

## 2022-02-21 DIAGNOSIS — Z9842 Cataract extraction status, left eye: Secondary | ICD-10-CM | POA: Insufficient documentation

## 2022-02-21 DIAGNOSIS — I10 Essential (primary) hypertension: Secondary | ICD-10-CM | POA: Diagnosis not present

## 2022-02-21 DIAGNOSIS — Z853 Personal history of malignant neoplasm of breast: Secondary | ICD-10-CM | POA: Insufficient documentation

## 2022-02-21 DIAGNOSIS — Z6841 Body Mass Index (BMI) 40.0 and over, adult: Secondary | ICD-10-CM | POA: Insufficient documentation

## 2022-02-21 DIAGNOSIS — H25811 Combined forms of age-related cataract, right eye: Secondary | ICD-10-CM | POA: Insufficient documentation

## 2022-02-21 HISTORY — PX: CATARACT EXTRACTION W/PHACO: SHX586

## 2022-02-21 SURGERY — PHACOEMULSIFICATION, CATARACT, WITH IOL INSERTION
Anesthesia: Monitor Anesthesia Care | Site: Eye | Laterality: Right

## 2022-02-21 MED ORDER — TETRACAINE HCL 0.5 % OP SOLN
1.0000 [drp] | OPHTHALMIC | Status: AC | PRN
  Administered 2022-02-21 (×3): 1 [drp] via OPHTHALMIC

## 2022-02-21 MED ORDER — MIDAZOLAM HCL 5 MG/5ML IJ SOLN
INTRAMUSCULAR | Status: DC | PRN
  Administered 2022-02-21: 1 mg via INTRAVENOUS

## 2022-02-21 MED ORDER — SODIUM HYALURONATE 10 MG/ML IO SOLUTION
PREFILLED_SYRINGE | INTRAOCULAR | Status: DC | PRN
  Administered 2022-02-21: .85 mL via INTRAOCULAR

## 2022-02-21 MED ORDER — MOXIFLOXACIN HCL 0.5 % OP SOLN
OPHTHALMIC | Status: DC | PRN
  Administered 2022-02-21: .2 mL via OPHTHALMIC

## 2022-02-21 MED ORDER — SODIUM HYALURONATE 23MG/ML IO SOSY
PREFILLED_SYRINGE | INTRAOCULAR | Status: DC | PRN
  Administered 2022-02-21: .6 mL via INTRAOCULAR

## 2022-02-21 MED ORDER — BSS IO SOLN
INTRAOCULAR | Status: DC | PRN
  Administered 2022-02-21: 15 mL via INTRAOCULAR

## 2022-02-21 MED ORDER — MOXIFLOXACIN HCL 5 MG/ML IO SOLN
INTRAOCULAR | Status: AC
  Filled 2022-02-21: qty 1

## 2022-02-21 MED ORDER — LIDOCAINE HCL (PF) 1 % IJ SOLN
INTRAOCULAR | Status: DC | PRN
  Administered 2022-02-21: 1 mL via OPHTHALMIC

## 2022-02-21 MED ORDER — POVIDONE-IODINE 5 % OP SOLN
OPHTHALMIC | Status: DC | PRN
  Administered 2022-02-21: 1 via OPHTHALMIC

## 2022-02-21 MED ORDER — TROPICAMIDE 1 % OP SOLN
1.0000 [drp] | OPHTHALMIC | Status: AC | PRN
  Administered 2022-02-21 (×3): 1 [drp] via OPHTHALMIC

## 2022-02-21 MED ORDER — PHENYLEPHRINE HCL 2.5 % OP SOLN
1.0000 [drp] | OPHTHALMIC | Status: AC | PRN
  Administered 2022-02-21 (×3): 1 [drp] via OPHTHALMIC

## 2022-02-21 MED ORDER — LIDOCAINE HCL 3.5 % OP GEL
1.0000 | Freq: Once | OPHTHALMIC | Status: AC
  Administered 2022-02-21: 1 via OPHTHALMIC

## 2022-02-21 MED ORDER — MIDAZOLAM HCL 2 MG/2ML IJ SOLN
INTRAMUSCULAR | Status: AC
  Filled 2022-02-21: qty 2

## 2022-02-21 MED ORDER — EPINEPHRINE PF 1 MG/ML IJ SOLN
INTRAOCULAR | Status: DC | PRN
  Administered 2022-02-21: 500 mL

## 2022-02-21 MED ORDER — STERILE WATER FOR IRRIGATION IR SOLN
Status: DC | PRN
  Administered 2022-02-21: 250 mL

## 2022-02-21 SURGICAL SUPPLY — 17 items
CATARACT SUITE SIGHTPATH (MISCELLANEOUS) ×1 IMPLANT
CLOTH BEACON ORANGE TIMEOUT ST (SAFETY) ×1 IMPLANT
EYE SHIELD UNIVERSAL CLEAR (GAUZE/BANDAGES/DRESSINGS) IMPLANT
FEE CATARACT SUITE SIGHTPATH (MISCELLANEOUS) ×1 IMPLANT
GLOVE BIOGEL PI IND STRL 7.0 (GLOVE) ×2 IMPLANT
LENS IOL EYHANCE TORIC II 24.0 ×1 IMPLANT
LENS IOL EYHANCE TRC 225 24.0 IMPLANT
LENS IOL EYHNC TORIC 225 24.0 ×1 IMPLANT
NDL HYPO 18GX1.5 BLUNT FILL (NEEDLE) ×1 IMPLANT
NEEDLE HYPO 18GX1.5 BLUNT FILL (NEEDLE) ×1 IMPLANT
PAD ARMBOARD 7.5X6 YLW CONV (MISCELLANEOUS) ×1 IMPLANT
RING MALYGIN 7.0 (MISCELLANEOUS) IMPLANT
SIGHTPATH CAT PROC W REG LENS (Ophthalmic Related) IMPLANT
SYR TB 1ML LL NO SAFETY (SYRINGE) ×1 IMPLANT
TAPE SURG TRANSPORE 1 IN (GAUZE/BANDAGES/DRESSINGS) IMPLANT
TAPE SURGICAL TRANSPORE 1 IN (GAUZE/BANDAGES/DRESSINGS) ×1
WATER STERILE IRR 250ML POUR (IV SOLUTION) ×1 IMPLANT

## 2022-02-21 NOTE — Discharge Instructions (Addendum)
Please discharge patient when stable, will follow up today with Dr. Wrzosek at the [facility] office immediately following discharge.  Leave shield in place until visit.  All paperwork with discharge instructions will be given at the office.  [facility] Address:  [address]  [place], [place] [address]  

## 2022-02-21 NOTE — Anesthesia Preprocedure Evaluation (Signed)
Anesthesia Evaluation  Patient identified by MRN, date of birth, ID band Patient awake    Reviewed: Allergy & Precautions, H&P , NPO status , Patient's Chart, lab work & pertinent test results  Airway Mallampati: II  TM Distance: >3 FB Neck ROM: Full    Dental no notable dental hx.    Pulmonary shortness of breath, COPD, former smoker   Pulmonary exam normal breath sounds clear to auscultation       Cardiovascular hypertension, Normal cardiovascular exam+ dysrhythmias  Rhythm:Regular Rate:Normal     Neuro/Psych  PSYCHIATRIC DISORDERS Anxiety Depression     Neuromuscular disease negative neurological ROS     GI/Hepatic negative GI ROS, Neg liver ROS,,,  Endo/Other    Morbid obesity  Renal/GU negative Renal ROS  negative genitourinary   Musculoskeletal negative musculoskeletal ROS (+) Arthritis ,    Abdominal   Peds negative pediatric ROS (+)  Hematology  (+) Blood dyscrasia, anemia   Anesthesia Other Findings Breast cancer ([place]) 04/2020 left  Reproductive/Obstetrics negative OB ROS                             Anesthesia Physical Anesthesia Plan  ASA: 3  Anesthesia Plan: MAC   Post-op Pain Management:    Induction: Intravenous  PONV Risk Score and Plan:   Airway Management Planned: Nasal Cannula  Additional Equipment:   Intra-op Plan:   Post-operative Plan:   Informed Consent: I have reviewed the patients History and Physical, chart, labs and discussed the procedure including the risks, benefits and alternatives for the proposed anesthesia with the patient or authorized representative who has indicated his/her understanding and acceptance.       Plan Discussed with: CRNA  Anesthesia Plan Comments:        Anesthesia Quick Evaluation

## 2022-02-21 NOTE — Anesthesia Postprocedure Evaluation (Signed)
Anesthesia Post Note  Patient: Victoria Dean  Procedure(s) Performed: CATARACT EXTRACTION PHACO AND INTRAOCULAR LENS PLACEMENT ([facility]) (Right: Eye)  Patient location during evaluation: PACU Anesthesia Type: MAC Level of consciousness: awake and alert Pain management: pain level controlled Vital Signs Assessment: post-procedure vital signs reviewed and stable Respiratory status: spontaneous breathing, nonlabored ventilation, respiratory function stable and patient connected to nasal cannula oxygen Cardiovascular status: stable and blood pressure returned to baseline Postop Assessment: no apparent nausea or vomiting Anesthetic complications: no   No notable events documented.   Last Vitals:  Vitals:   02/21/22 0800 02/21/22 0845  BP:  (!) 144/73  Pulse: 83 85  Resp: 19 18  Temp:  36.7 C  SpO2: 96% 100%    Last Pain:  Vitals:   02/21/22 0845  TempSrc: Oral  PainSc: 0-No pain                 [name]

## 2022-02-21 NOTE — Op Note (Signed)
aDate of procedure: 02/21/22  Pre-operative diagnosis: Visually significant age-related combined cataract, Right Eye; Visually Significant Astigmatism, Right Eye (H25.811)  Post-operative diagnosis: Visually significant age-related cataract, Right Eye; Visually Significant Astigmatism, Right Eye  Procedure: Removal of cataract via phacoemulsification and insertion of intra-ocular lens Wynetta Emery and Johnson [id number] +24.0D into the capsular bag of the Right Eye  Attending surgeon: Gerda Diss. Haralambos Yeatts, MD, MA  Anesthesia: MAC, Topical Akten  Complications: None  Estimated Blood Loss: <65m (minimal)  Specimens: None  Implants: As above  Indications:  Visually significant age-related cataract, Right Eye; Visually Significant Astigmatism, Right Eye  Procedure:  The patient was seen and identified in the pre-operative area. The operative eye was identified and dilated.  The operative eye was marked.  Pre-operative toric markers were used to mark the eye at 0 and 180 degrees. Topical anesthesia was administered to the operative eye.     The patient was then to the operative suite and placed in the supine position.  A timeout was performed confirming the patient, procedure to be performed, and all other relevant information.   The patient's face was prepped and draped in the usual fashion for intra-ocular surgery.  A lid speculum was placed into the operative eye and the surgical microscope moved into place and focused.  A superotemporal paracentesis was created using a 20 gauge paracentesis blade.  Shugarcaine was injected into the anterior chamber.  Viscoelastic was injected into the anterior chamber.  A temporal clear-corneal main wound incision was created using a 2.450mmicrokeratome.  A continuous curvilinear capsulorrhexis was initiated using an irrigating cystitome and completed using capsulorrhexis forceps.  Hydrodissection and hydrodeliniation were performed.  Viscoelastic was injected into the  anterior chamber.  A phacoemulsification handpiece and a chopper as a second instrument were used to remove the nucleus and epinucleus. The irrigation/aspiration handpiece was used to remove any remaining cortical material.   The capsular bag was reinflated with viscoelastic, checked, and found to be intact.  The intraocular lens was inserted into the capsular bag and dialed into place using a Kuglen hook to 178 degrees.  The irrigation/aspiration handpiece was used to remove any remaining viscoelastic.  The clear corneal wound and paracentesis wounds were then hydrated and checked with Weck-Cels to be watertight. 0.30m74mf moxifloxacin was injected into the anterior chamber. The lid-speculum and drape was removed, and the patient's face was cleaned with a wet and dry 4x4. A clear shield was taped over the eye. The patient was taken to the post-operative care unit in good condition, having tolerated the procedure well.  Post-Op Instructions: [facility] and will receive all other orders and instructions.

## 2022-02-21 NOTE — Interval H&P Note (Signed)
History and Physical Interval Note:  02/21/2022 8:23 AM  Victoria Dean  has presented today for surgery, with the diagnosis of combined forms age related cataract; right.  The various methods of treatment have been discussed with the patient and family. After consideration of risks, benefits and other options for treatment, the patient has consented to  Procedure(s): CATARACT EXTRACTION PHACO AND INTRAOCULAR LENS PLACEMENT ([place]) (Right) as a surgical intervention.  The patient's history has been reviewed, patient examined, no change in status, stable for surgery.  I have reviewed the patient's chart and labs.  Questions were answered to the patient's satisfaction.     Baruch Goldmann

## 2022-02-21 NOTE — Transfer of Care (Signed)
Immediate Anesthesia Transfer of Care Note  Patient: Victoria Dean  Procedure(s) Performed: CATARACT EXTRACTION PHACO AND INTRAOCULAR LENS PLACEMENT ([facility]) (Right: Eye)  Patient Location: PACU  Anesthesia Type:MAC  Level of Consciousness: awake, alert , and oriented  Airway & Oxygen Therapy: Patient Spontanous Breathing  Post-op Assessment: Report given to RN and Post -op Vital signs reviewed and stable  Post vital signs: Reviewed and stable  Last Vitals:  Vitals Value Taken Time  BP    Temp    Pulse    Resp    SpO2      Last Pain:  Vitals:   02/21/22 0745  PainSc: 0-No pain         Complications: No notable events documented.

## 2022-02-28 ENCOUNTER — Ambulatory Visit: Payer: Medicare PPO | Admitting: Radiation Oncology

## 2022-02-28 ENCOUNTER — Encounter (HOSPITAL_COMMUNITY): Payer: Self-pay | Admitting: Ophthalmology

## 2022-03-03 ENCOUNTER — Encounter: Payer: Self-pay | Admitting: Radiation Oncology

## 2022-03-03 ENCOUNTER — Ambulatory Visit
Admission: RE | Admit: 2022-03-03 | Discharge: 2022-03-03 | Disposition: A | Discharge status: Home / Self Care | Payer: Medicare PPO | Source: Ambulatory Visit | Attending: Radiation Oncology | Admitting: Radiation Oncology

## 2022-03-03 VITALS — BP 154/94 | HR 97 | Temp 97.5°F | Resp 20 | Ht 64.0 in | Wt 252.0 lb

## 2022-03-03 DIAGNOSIS — Z170 Estrogen receptor positive status [ER+]: Secondary | ICD-10-CM | POA: Diagnosis not present

## 2022-03-03 DIAGNOSIS — Z923 Personal history of irradiation: Secondary | ICD-10-CM | POA: Diagnosis not present

## 2022-03-03 DIAGNOSIS — Z79811 Long term (current) use of aromatase inhibitors: Secondary | ICD-10-CM | POA: Insufficient documentation

## 2022-03-03 DIAGNOSIS — C50412 Malignant neoplasm of upper-outer quadrant of left female breast: Secondary | ICD-10-CM

## 2022-03-03 DIAGNOSIS — C50912 Malignant neoplasm of unspecified site of left female breast: Secondary | ICD-10-CM | POA: Insufficient documentation

## 2022-03-03 NOTE — Progress Notes (Signed)
Radiation Oncology Follow up Note  Name: Victoria Dean   Date:   03/03/2022 MRN:  [id number] DOB: 1950-07-29    This 72 y.o. 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  REFERRING PROVIDER: Jerrol Banana.,*  HPI: Patient is a 72 year old female now out 1 year having completed whole breast radiation to her left breast for stage IIIa lobular carcinoma.  Seen today in routine follow-up she is doing well.  She specifically denies breast tenderness cough or bone pain..  She had mammograms back in May which I reviewed were BI-RADS 2 benign.  She is currently on letrozole tolerating it well without side effect.  COMPLICATIONS OF TREATMENT: none  FOLLOW UP COMPLIANCE: keeps appointments   PHYSICAL EXAM:  BP (!) 154/94 (BP Location: Right Wrist, Patient Position: Sitting, Cuff Size: Small)   Pulse 97   Temp (!) 97.5 F (36.4 C) (Tympanic)   Resp 20   Ht '5\' 4"'$  (1.626 m)   Wt 252 lb (114.3 kg)   BMI 43.26 kg/m  Lungs are clear to A&P cardiac examination essentially unremarkable with regular rate and rhythm. No dominant mass or nodularity is noted in either breast in 2 positions examined. Incision is well-healed. No axillary or supraclavicular adenopathy is appreciated.  There is some deformity of the left breast secondary to surgery although the cosmetic result is still good..  Well-developed well-nourished patient in NAD. HEENT reveals PERLA, EOMI, discs not visualized.  Oral cavity is clear. No oral mucosal lesions are identified. Neck is clear without evidence of cervical or supraclavicular adenopathy. Lungs are clear to A&P. Cardiac examination is essentially unremarkable with regular rate and rhythm without murmur rub or thrill. Abdomen is benign with no organomegaly or masses noted. Motor sensory and DTR levels are equal and symmetric in the upper and  lower extremities. Cranial nerves II through XII are grossly intact. Proprioception is intact. No peripheral adenopathy or edema is identified. No motor or sensory levels are noted. Crude visual fields are within normal range.  RADIOLOGY RESULTS: Mammograms reviewed compatible with above-stated findings  PLAN: Present time patient is doing well no evidence of disease now out 1 year from whole breast radiation and pleased with her overall progress.  She continues on letrozole without side effect.  I have asked to see her back in 1 year for follow-up.  Patient is to call with any concerns.  I would like to take this opportunity to thank you for allowing me to participate in the care of your patient.Noreene Filbert, MD

## 2022-04-02 ENCOUNTER — Encounter: Payer: Self-pay | Admitting: Pulmonary Disease

## 2022-04-18 ENCOUNTER — Other Ambulatory Visit: Payer: Self-pay

## 2022-04-18 DIAGNOSIS — Z170 Estrogen receptor positive status [ER+]: Secondary | ICD-10-CM

## 2022-04-21 ENCOUNTER — Inpatient Hospital Stay: Payer: Medicare PPO | Attending: Radiation Oncology

## 2022-04-21 ENCOUNTER — Inpatient Hospital Stay: Payer: Medicare PPO

## 2022-04-21 ENCOUNTER — Encounter: Payer: Self-pay | Admitting: Oncology

## 2022-04-21 ENCOUNTER — Inpatient Hospital Stay: Payer: Medicare PPO | Admitting: Oncology

## 2022-04-21 VITALS — BP 121/68 | HR 99 | Temp 98.2°F | Resp 18 | Ht 64.0 in | Wt 254.9 lb

## 2022-04-21 DIAGNOSIS — Z170 Estrogen receptor positive status [ER+]: Secondary | ICD-10-CM | POA: Diagnosis not present

## 2022-04-21 DIAGNOSIS — Z7983 Long term (current) use of bisphosphonates: Secondary | ICD-10-CM

## 2022-04-21 DIAGNOSIS — Z79899 Other long term (current) drug therapy: Secondary | ICD-10-CM | POA: Diagnosis not present

## 2022-04-21 DIAGNOSIS — Z79811 Long term (current) use of aromatase inhibitors: Secondary | ICD-10-CM | POA: Insufficient documentation

## 2022-04-21 DIAGNOSIS — Z853 Personal history of malignant neoplasm of breast: Secondary | ICD-10-CM

## 2022-04-21 DIAGNOSIS — C50412 Malignant neoplasm of upper-outer quadrant of left female breast: Secondary | ICD-10-CM | POA: Diagnosis not present

## 2022-04-21 DIAGNOSIS — Z5181 Encounter for therapeutic drug level monitoring: Secondary | ICD-10-CM

## 2022-04-21 DIAGNOSIS — Z08 Encounter for follow-up examination after completed treatment for malignant neoplasm: Secondary | ICD-10-CM | POA: Diagnosis not present

## 2022-04-21 DIAGNOSIS — E538 Deficiency of other specified B group vitamins: Secondary | ICD-10-CM

## 2022-04-21 LAB — CBC WITH DIFFERENTIAL (CANCER CENTER ONLY)
Abs Immature Granulocytes: 0.05 10*3/uL (ref 0.00–0.07)
Basophils Absolute: 0.1 10*3/uL (ref 0.0–0.1)
Basophils Relative: 1 %
Eosinophils Absolute: 0.1 10*3/uL (ref 0.0–0.5)
Eosinophils Relative: 1 %
HCT: 35.3 % — ABNORMAL LOW (ref 36.0–46.0)
Hemoglobin: 11.1 g/dL — ABNORMAL LOW (ref 12.0–15.0)
Immature Granulocytes: 1 %
Lymphocytes Relative: 25 %
Lymphs Abs: 1.9 10*3/uL (ref 0.7–4.0)
MCH: 29.3 pg (ref 26.0–34.0)
MCHC: 31.4 g/dL (ref 30.0–36.0)
MCV: 93.1 fL (ref 80.0–100.0)
Monocytes Absolute: 0.7 10*3/uL (ref 0.1–1.0)
Monocytes Relative: 9 %
Neutro Abs: 4.9 10*3/uL (ref 1.7–7.7)
Neutrophils Relative %: 63 %
Platelet Count: 263 10*3/uL (ref 150–400)
RBC: 3.79 MIL/uL — ABNORMAL LOW (ref 3.87–5.11)
RDW: 15.2 % (ref 11.5–15.5)
WBC Count: 7.7 10*3/uL (ref 4.0–10.5)
nRBC: 0 % (ref 0.0–0.2)

## 2022-04-21 LAB — CMP (CANCER CENTER ONLY)
ALT: 33 U/L (ref 0–44)
AST: 27 U/L (ref 15–41)
Albumin: 3.8 g/dL (ref 3.5–5.0)
Alkaline Phosphatase: 123 U/L (ref 38–126)
Anion gap: 10 (ref 5–15)
BUN: 23 mg/dL (ref 8–23)
CO2: 26 mmol/L (ref 22–32)
Calcium: 8.8 mg/dL — ABNORMAL LOW (ref 8.9–10.3)
Chloride: 100 mmol/L (ref 98–111)
Creatinine: 0.95 mg/dL (ref 0.44–1.00)
GFR, Estimated: >60 mL/min (ref >60)
Glucose, Bld: 124 mg/dL — ABNORMAL HIGH (ref 70–99)
Potassium: 3.3 mmol/L — ABNORMAL LOW (ref 3.5–5.1)
Sodium: 136 mmol/L (ref 135–145)
Total Bilirubin: 0.4 mg/dL (ref 0.3–1.2)
Total Protein: 7.1 g/dL (ref 6.5–8.1)

## 2022-04-21 MED ORDER — ZOLEDRONIC ACID 4 MG/100ML IV SOLN
4.0000 mg | Freq: Once | INTRAVENOUS | Status: AC
  Administered 2022-04-21: 4 mg via INTRAVENOUS
  Filled 2022-04-21: qty 100

## 2022-04-21 MED ORDER — SODIUM CHLORIDE 0.9 % IV SOLN
Freq: Once | INTRAVENOUS | Status: AC
  Filled 2022-04-21: qty 250

## 2022-04-21 NOTE — Progress Notes (Signed)
No concerns. 

## 2022-04-21 NOTE — Progress Notes (Signed)
Hematology/Oncology Consult note [facility]  Telephone:([phone] [phone]  Patient Care Team: Ludwig Clarks, FNP as PCP - General (Family Medicine) Lorelee Cover., MD as Consulting Physician (Ophthalmology) Dimmig, Marcello Moores, MD as Referring Physician (Orthopedic Surgery) Oneta Rack, MD (Dermatology) Dasher, Rayvon Char, MD (Dermatology) Sindy Guadeloupe, MD as Consulting Physician (Hematology and Oncology) Noreene Filbert, MD as Consulting Physician (Radiation Oncology) Bary Castilla [name], MD as Consulting Physician (General Surgery)   Name of the patient: Victoria Dean  [id number]  [birth year]   Date of visit: 04/21/22  Diagnosis-  pathological prognostic stage Ib invasive lobular carcinoma of the left breast [id number] ER/PR positive HER2 negative   Chief complaint/ Reason for visit-routine follow-up of breast cancer and to receive Zometa  Heme/Onc history:  Patient is a 72 year old female with a past medical history significant for claustrophobia, hypertension hypercholesterolemia, depression among other medical problems.  Patient began to notice retraction of her left nipple.  This was followed by a diagnostic mammogram which showed vague shadowing in the upper outer quadrant of the left breast without a definitive mass.  Benign cysts in the lower left breast measuring 1.5 cm.  No evidence of malignancy in the right breast.  No evidence of abnormal left axillary lymph nodes.  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   Bilateral breast MRI showed 7 x 6 x 4 cm area of non-mass enhancement in the upper outer left breast at 1 o'clock position.  This involves anterior and middle third of the breast and extension into the posterior third.  2 inferior left axillary lymph  nodes with mild cortical thickening.   PET CT scan showed 1.7 cm hypermetabolic nodule in the midline of the neck which could represent thyroglossal cyst but malignancy cannot be excluded. Patient underwent left-sided lumpectomy with nipple excision.     Final pathology showed invasive lobular carcinoma grade 210.5 cm with associated DCIS.  Skin/nipple are present and involved.  Invasive carcinoma invades the nipple without skin ulceration.  Lymphovascular invasion present.  Margins negative.  3 out of 4 lymph nodes positive for malignancy 11 mm largest deposit with extranodal extension.  pT3 pN1 a cM0   Patient was taken back for axillary dissection given 3 lymph nodes positive with extranodal extension and 4 more lymph nodes out of 9 were positive with extranodal extension   Given cardiovascular comorbidities and patient age none anthracycline adjuvant chemotherapy options were discussed.  Patient did not wish to proceed with TC chemotherapy.  She completed 12 cycles of weekly Taxol chemotherapy in October 2022 followed by adjuvant radiation therapy.    Interval history-patient continues to have significant exertional shortness of breath which has remained unchanged and she follows up with pulmonary.  Tolerating letrozole  along with calcium and vitamin D well  ECOG PS- 2 Pain scale- 0 Opioid associated constipation- no  Review of systems- Review of Systems  Constitutional:  Negative for chills, fever, malaise/fatigue and weight loss.  HENT:  Negative for congestion, ear discharge and nosebleeds.   Eyes:  Negative for blurred vision.  Respiratory:  Positive for shortness of breath. Negative for cough, hemoptysis, sputum production and wheezing.   Cardiovascular:  Negative for chest pain, palpitations, orthopnea and claudication.  Gastrointestinal:  Negative for abdominal pain, blood in stool, constipation, diarrhea, heartburn,  melena, nausea and vomiting.  Genitourinary:  Negative for  dysuria, flank pain, frequency, hematuria and urgency.  Musculoskeletal:  Negative for back pain, joint pain and myalgias.  Skin:  Negative for rash.  Neurological:  Negative for dizziness, tingling, focal weakness, seizures, weakness and headaches.  Endo/Heme/Allergies:  Does not bruise/bleed easily.  Psychiatric/Behavioral:  Negative for depression and suicidal ideas. The patient does not have insomnia.       Allergies  Allergen Reactions   Other Itching    Brand name bandaids with cloth backing causes redness and itching   Statins     Muscle weakness   Zetia [Ezetimibe]     Leg pain, upset stomach   Sulfa Antibiotics Rash     Past Medical History:  Diagnosis Date   Anemia    Breast cancer ([place]) 04/2020   left   Cataract    Dyspnea    Dysrhythmia    Emphysema of lung ([place])    Family history of breast cancer    Family history of stomach cancer    Hypercholesteremia    Hypertension    Major depressive disorder    Personal history of chemotherapy    Personal history of radiation therapy    PVC (premature ventricular contraction)    sporatic   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    Vitamin D deficiency      Past Surgical History:  Procedure Laterality Date   ABDOMINAL HYSTERECTOMY  1995   APPENDECTOMY  1994   BREAST BIOPSY Right 1998   neg   BREAST BIOPSY Left 05/30/2020   stere bx x clip path pending   BREAST LUMPECTOMY WITH SENTINEL LYMPH NODE BIOPSY Left 06/29/2020   Procedure: BREAST LUMPECTOMY WITH SENTINEL LYMPH NODE BX;  Surgeon: Robert Bellow, MD;  Location: [facility] ORS;  Service: General;  Laterality: Left;   CATARACT EXTRACTION W/PHACO Left 01/31/2022   Procedure: CATARACT EXTRACTION PHACO AND INTRAOCULAR LENS PLACEMENT ([place]);  Surgeon: Baruch Goldmann, MD;  Location: [facility];  Service: Ophthalmology;  Laterality: Left;  CDE 8.10   CATARACT EXTRACTION W/PHACO Right 02/21/2022   Procedure: CATARACT  EXTRACTION PHACO AND INTRAOCULAR LENS PLACEMENT ([facility]);  Surgeon: Baruch Goldmann, MD;  Location: [facility];  Service: Ophthalmology;  Laterality: Right;  CDE: 9.42   ESI     LUMBAR MICRODISCECTOMY  2019   L3-4   LUMBAR MICRODISCECTOMY  2008   L4-L5 repeated   MICRODISCECTOMY LUMBAR  2008   L4-5   MICRODISCECTOMY LUMBAR  2010   L4-L5 repeated   NODE DISSECTION Left 08/13/2020   Procedure: AXILLARY NODE DISSECTION;  Surgeon: Robert Bellow, MD;  Location: [facility] OR[facility];  Service: General;  Laterality: Left;   PORTACATH PLACEMENT Right 09/05/2020   Procedure: INSERTION PORT-A-CATH;  Surgeon: Robert Bellow, MD;  Location: [facility] ORS;  Service: General;  Laterality: Right;  right; monitor anesthesia care   SQUAMOUS CELL CARCINOMA EXCISION     TONSILLECTOMY  1971    Social History   Socioeconomic History   Marital status: Widowed    Spouse name: Not on file   Number of children: 1   Years of education: Not on file   Highest education level: Bachelor's degree (e.g., BA, AB, BS)  Occupational History   Occupation: retired  Tobacco Use   Smoking status: Former    Packs/day: 0.91    Years: 33.00    Additional pack years: 0.00    Total  pack years: 30.03    Types: Cigarettes    Quit date: 04/19/2018    Years since quitting: 4.0   Smokeless tobacco: Never  Vaping Use   Vaping Use: Never used  Substance and Sexual Activity   Alcohol use: Yes    Comment: rarely   Drug use: No   Sexual activity: Not Currently  Other Topics Concern   Not on file  Social History Narrative   Not on file   Social Determinants of Health   Financial Resource Strain: Low Risk  (10/23/2021)   Overall Financial Resource Strain (CARDIA)    Difficulty of Paying Living Expenses: Not hard at all  Food Insecurity: No Food Insecurity (10/23/2021)   Hunger Vital Sign    Worried About Running Out of Food in the Last Year: Never true    Ran Out of Food in the Last Year: Never true  Transportation Needs: No  Transportation Needs (10/23/2021)   PRAPARE - Hydrologist (Medical): No    Lack of Transportation (Non-Medical): No  Physical Activity: Inactive (02/16/2018)   Exercise Vital Sign    Days of Exercise per Week: 0 days    Minutes of Exercise per Session: 0 min  Stress: No Stress Concern Present (02/16/2018)   [place]    Feeling of Stress : Only a little  Social Connections: Moderately Isolated (10/23/2021)   Social Connection and Isolation Panel [NHANES]    Frequency of Communication with Friends and Family: More than three times a week    Frequency of Social Gatherings with Friends and Family: More than three times a week    Attends Religious Services: More than 4 times per year    Active Member of [employer] or Organizations: No    Attends Archivist Meetings: Never    Marital Status: Widowed  Intimate Partner Violence: Not At Risk (10/23/2021)   Humiliation, Afraid, Rape, and Kick questionnaire    Fear of Current or Ex-Partner: No    Emotionally Abused: No    Physically Abused: No    Sexually Abused: No    Family History  Problem Relation Age of Onset   Breast cancer Mother 23   Osteosarcoma Mother        d. 70   Hypertension Brother    COPD Father    Hypertension Father    Breast cancer Paternal Aunt        dx 79s   Breast cancer Paternal Grandmother        dx 33s   Stomach cancer Paternal Grandfather        dx 8s   Breast cancer Paternal Aunt        dx 75s   Brain cancer Paternal Aunt        dx 59s     Current Outpatient Medications:    acetaminophen (TYLENOL) 650 MG CR tablet, Take 1,300 mg by mouth every 8 (eight) hours as needed for pain., Disp: , Rfl:    albuterol (VENTOLIN HFA) 108 (90 Base) MCG/ACT inhaler, Inhale 2 puffs into the lungs every 6 (six) hours as needed for wheezing or shortness of breath., Disp: , Rfl:    Biotin 1000 MCG tablet, Take 1,000 mcg by  mouth daily., Disp: , Rfl:    Calcium Carbonate-Vit D-Min (CALCIUM 1200 PO), Take 1 tablet by mouth 2 (two) times daily., Disp: , Rfl:    cholecalciferol (VITAMIN D) 25 MCG (1000 UNIT) tablet,  Take 1,000 Units by mouth daily., Disp: , Rfl:    cyanocobalamin (VITAMIN B12) 1000 MCG/ML injection, Inject 1 mL (1,000 mcg total) into the muscle every 30 (thirty) days., Disp: 3 mL, Rfl: 4   diazepam (VALIUM) 5 MG tablet, Take 1 tablet (5 mg total) by mouth every 12 (twelve) hours as needed for anxiety., Disp: 60 tablet, Rfl: 5   famotidine (PEPCID) 10 MG tablet, Take by mouth. Take 10 mg by mouth 2 (two) times daily as needed for Heartburn, Disp: , Rfl:    hydrocortisone 2.5 % lotion, Apply 1 application topically daily as needed (when washing face)., Disp: , Rfl:    letrozole (FEMARA) 2.5 MG tablet, TAKE 1 TABLET EVERY DAY, Disp: 90 tablet, Rfl: 3   lidocaine-prilocaine (EMLA) cream, Apply to affected area once, Disp: 30 g, Rfl: 3   loratadine (CLARITIN) 10 MG tablet, Take by mouth. Take 10 mg by mouth once daily, Disp: , Rfl:    losartan (COZAAR) 100 MG tablet, TAKE 1 TABLET EVERY DAY, Disp: 90 tablet, Rfl: 1   Menthol, Topical Analgesic, (BIOFREEZE EX), Apply 1 application topically daily as needed (back pain)., Disp: , Rfl:    mometasone (ELOCON) 0.1 % cream, Apply 1 application topically daily as needed (ear irritation)., Disp: , Rfl:    Polyethyl Glycol-Propyl Glycol (SYSTANE OP), Place 1 drop into both eyes daily as needed (dry eyes)., Disp: , Rfl:    rosuvastatin (CRESTOR) 5 MG tablet, Take 1 tablet (5 mg total) by mouth daily., Disp: 90 tablet, Rfl: 3   Syringe/Needle, Disp, (SYRINGE 3CC/25GX1") 25G X 1" 3 ML MISC, 1 Syringe by Does not apply route every 30 (thirty) days., Disp: 12 each, Rfl: 0   triamterene-hydrochlorothiazide (DYAZIDE) 37.5-25 MG capsule, TAKE 1 CAPSULE EVERY DAY, Disp: 90 capsule, Rfl: 1   venlafaxine XR (EFFEXOR-XR) 75 MG 24 hr capsule, TAKE 3 CAPSULES (225 MG TOTAL) BY MOUTH  DAILY WITH BREAKFAST. (Patient taking differently: Take 150 mg by mouth daily with breakfast. 2 tablets at bedtime), Disp: 270 capsule, Rfl: 1   zoledronic acid (ZOMETA) 4 MG/5ML injection, Inject 4 mg into the vein once., Disp: , Rfl:   Physical exam:  Vitals:   04/21/22 1304  BP: 121/68  Pulse: 99  Resp: 18  Temp: 98.2 F (36.8 C)  TempSrc: Tympanic  SpO2: 97%  Weight: 254 lb 14.4 oz (115.6 kg)  Height: 5\' 4"  (1.626 m)   Physical Exam Cardiovascular:     Rate and Rhythm: Normal rate and regular rhythm.     Heart sounds: Normal heart sounds.  Pulmonary:     Effort: Pulmonary effort is normal.     Breath sounds: Normal breath sounds.  Abdominal:     General: Bowel sounds are normal.     Palpations: Abdomen is soft.  Skin:    General: Skin is warm and dry.  Neurological:     Mental Status: She is alert and oriented to person, place, and time.   Breast exam was performed in seated and lying down position. Patient is status post left lumpectomy with a well-healed surgical scar.  Left nipple and areola are surgically absent.  No evidence of any palpable masses. No evidence of axillary adenopathy. No evidence of any palpable masses or lumps in the right breast. No evidence of right axillary adenopathy       Latest Ref Rng & Units 04/21/2022   12:52 PM  CMP  Glucose 70 - 99 mg/dL 124   BUN 8 - 23  mg/dL 23   Creatinine 0.44 - 1.00 mg/dL 0.95   Sodium 135 - 145 mmol/L 136   Potassium 3.5 - 5.1 mmol/L 3.3   Chloride 98 - 111 mmol/L 100   CO2 22 - 32 mmol/L 26   Calcium 8.9 - 10.3 mg/dL 8.8   Total Protein 6.5 - 8.1 g/dL 7.1   Total Bilirubin 0.3 - 1.2 mg/dL 0.4   Alkaline Phos 38 - 126 U/L 123   AST 15 - 41 U/L 27   ALT 0 - 44 U/L 33       Latest Ref Rng & Units 04/21/2022   12:52 PM  CBC  WBC 4.0 - 10.5 K/uL 7.7   Hemoglobin 12.0 - 15.0 g/dL 11.1   Hematocrit 36.0 - 46.0 % 35.3   Platelets 150 - 400 K/uL 263      Assessment and plan- Patient is a 72 y.o. 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  She is s/p 12 cycles of weekly Taxol chemotherapy and adjuvant radiation treatment.  She is currently on letrozole and this is a routine follow-up visit and to receive Zometa  Patient is tolerating letrozole well without any significant side effects.  She continues to get Zometa every 3 months and will get her next dose today.  She is due for 1 more year of Zometa.  I will see her back in 6 months no labs.  She will need a mammogram in May 2024   Visit Diagnosis 1. Encounter for follow-up surveillance of breast cancer   2. Visit for monitoring Arimidex therapy   3. Encounter for monitoring zoledronic acid therapy      Dr. Randa Evens, MD, MPH [facility] at [facility] [id number] 04/21/2022 4:02 PM

## 2022-05-26 ENCOUNTER — Telehealth: Payer: Self-pay | Admitting: Family Medicine

## 2022-05-26 NOTE — Telephone Encounter (Signed)
[facility] faxed refill request for the following medications:   triamterene-hydrochlorothiazide (DYAZIDE) 37.5-25 MG capsule    Please advise

## 2022-05-26 NOTE — Telephone Encounter (Signed)
Patient reports she has transferred her care to Erskine Speed, FNP.

## 2022-05-30 ENCOUNTER — Encounter: Payer: Self-pay | Admitting: Pulmonary Disease

## 2022-06-06 ENCOUNTER — Ambulatory Visit
Admission: RE | Admit: 2022-06-06 | Discharge: 2022-06-06 | Disposition: A | Discharge status: Home / Self Care | Payer: Medicare PPO | Source: Ambulatory Visit | Attending: Oncology | Admitting: Oncology

## 2022-06-06 ENCOUNTER — Other Ambulatory Visit: Payer: Self-pay | Admitting: Oncology

## 2022-06-06 DIAGNOSIS — Z08 Encounter for follow-up examination after completed treatment for malignant neoplasm: Secondary | ICD-10-CM | POA: Insufficient documentation

## 2022-06-06 DIAGNOSIS — Z853 Personal history of malignant neoplasm of breast: Secondary | ICD-10-CM | POA: Diagnosis present

## 2022-06-28 ENCOUNTER — Other Ambulatory Visit: Payer: Self-pay

## 2022-06-28 ENCOUNTER — Emergency Department (HOSPITAL_COMMUNITY): Payer: Medicare PPO

## 2022-06-28 ENCOUNTER — Encounter (HOSPITAL_COMMUNITY): Payer: Self-pay | Admitting: Emergency Medicine

## 2022-06-28 ENCOUNTER — Observation Stay (HOSPITAL_COMMUNITY)
Admission: EM | Admit: 2022-06-28 | Discharge: 2022-06-29 | Disposition: A | Discharge status: Home / Self Care | Payer: Medicare PPO | Attending: Internal Medicine | Admitting: Internal Medicine

## 2022-06-28 DIAGNOSIS — R0789 Other chest pain: Secondary | ICD-10-CM | POA: Diagnosis not present

## 2022-06-28 DIAGNOSIS — C50912 Malignant neoplasm of unspecified site of left female breast: Secondary | ICD-10-CM | POA: Insufficient documentation

## 2022-06-28 DIAGNOSIS — Z87891 Personal history of nicotine dependence: Secondary | ICD-10-CM | POA: Diagnosis not present

## 2022-06-28 DIAGNOSIS — Z79899 Other long term (current) drug therapy: Secondary | ICD-10-CM | POA: Insufficient documentation

## 2022-06-28 DIAGNOSIS — J449 Chronic obstructive pulmonary disease, unspecified: Secondary | ICD-10-CM | POA: Diagnosis not present

## 2022-06-28 DIAGNOSIS — R079 Chest pain, unspecified: Secondary | ICD-10-CM

## 2022-06-28 DIAGNOSIS — Z1152 Encounter for screening for COVID-19: Secondary | ICD-10-CM | POA: Insufficient documentation

## 2022-06-28 DIAGNOSIS — E876 Hypokalemia: Secondary | ICD-10-CM | POA: Insufficient documentation

## 2022-06-28 DIAGNOSIS — E782 Mixed hyperlipidemia: Secondary | ICD-10-CM | POA: Insufficient documentation

## 2022-06-28 DIAGNOSIS — E785 Hyperlipidemia, unspecified: Secondary | ICD-10-CM | POA: Insufficient documentation

## 2022-06-28 DIAGNOSIS — R0602 Shortness of breath: Secondary | ICD-10-CM | POA: Diagnosis not present

## 2022-06-28 DIAGNOSIS — G459 Transient cerebral ischemic attack, unspecified: Secondary | ICD-10-CM | POA: Diagnosis not present

## 2022-06-28 DIAGNOSIS — R42 Dizziness and giddiness: Secondary | ICD-10-CM

## 2022-06-28 DIAGNOSIS — K219 Gastro-esophageal reflux disease without esophagitis: Secondary | ICD-10-CM | POA: Insufficient documentation

## 2022-06-28 DIAGNOSIS — F411 Generalized anxiety disorder: Secondary | ICD-10-CM | POA: Diagnosis present

## 2022-06-28 DIAGNOSIS — Z85828 Personal history of other malignant neoplasm of skin: Secondary | ICD-10-CM | POA: Insufficient documentation

## 2022-06-28 DIAGNOSIS — I10 Essential (primary) hypertension: Secondary | ICD-10-CM | POA: Diagnosis not present

## 2022-06-28 LAB — CBC
HCT: 34.7 % — ABNORMAL LOW (ref 36.0–46.0)
Hemoglobin: 11.2 g/dL — ABNORMAL LOW (ref 12.0–15.0)
MCH: 29.9 pg (ref 26.0–34.0)
MCHC: 32.3 g/dL (ref 30.0–36.0)
MCV: 92.5 fL (ref 80.0–100.0)
Platelets: 246 10*3/uL (ref 150–400)
RBC: 3.75 MIL/uL — ABNORMAL LOW (ref 3.87–5.11)
RDW: 14.7 % (ref 11.5–15.5)
WBC: 7.6 10*3/uL (ref 4.0–10.5)
nRBC: 0 % (ref 0.0–0.2)

## 2022-06-28 LAB — SARS CORONAVIRUS 2 BY RT PCR: SARS Coronavirus 2 by RT PCR: NEGATIVE

## 2022-06-28 LAB — TROPONIN I (HIGH SENSITIVITY)
Troponin I (High Sensitivity): 11 ng/L (ref <18)
Troponin I (High Sensitivity): 9 ng/L (ref <18)

## 2022-06-28 LAB — D-DIMER, QUANTITATIVE: D-Dimer, Quant: <0.27 ug/mL-FEU (ref 0.00–0.50)

## 2022-06-28 LAB — BASIC METABOLIC PANEL
Anion gap: 12 (ref 5–15)
BUN: 26 mg/dL — ABNORMAL HIGH (ref 8–23)
CO2: 26 mmol/L (ref 22–32)
Calcium: 9.3 mg/dL (ref 8.9–10.3)
Chloride: 97 mmol/L — ABNORMAL LOW (ref 98–111)
Creatinine, Ser: 1.19 mg/dL — ABNORMAL HIGH (ref 0.44–1.00)
GFR, Estimated: 48 mL/min — ABNORMAL LOW (ref >60)
Glucose, Bld: 102 mg/dL — ABNORMAL HIGH (ref 70–99)
Potassium: 3.4 mmol/L — ABNORMAL LOW (ref 3.5–5.1)
Sodium: 135 mmol/L (ref 135–145)

## 2022-06-28 MED ORDER — SODIUM CHLORIDE 0.9 % IV BOLUS
500.0000 mL | Freq: Once | INTRAVENOUS | Status: AC
  Administered 2022-06-28: 500 mL via INTRAVENOUS

## 2022-06-28 MED ORDER — ONDANSETRON HCL 4 MG/2ML IJ SOLN
4.0000 mg | Freq: Once | INTRAMUSCULAR | Status: AC
  Administered 2022-06-28: 4 mg via INTRAVENOUS
  Filled 2022-06-28: qty 2

## 2022-06-28 MED ORDER — IOHEXOL 350 MG/ML SOLN
100.0000 mL | Freq: Once | INTRAVENOUS | Status: AC | PRN
  Administered 2022-06-28: 75 mL via INTRAVENOUS

## 2022-06-28 MED ORDER — PROCHLORPERAZINE EDISYLATE 10 MG/2ML IJ SOLN
5.0000 mg | Freq: Once | INTRAMUSCULAR | Status: AC
  Administered 2022-06-28: 5 mg via INTRAVENOUS
  Filled 2022-06-28: qty 2

## 2022-06-28 NOTE — ED Notes (Signed)
Patient transported to CT 

## 2022-06-28 NOTE — ED Triage Notes (Signed)
Pt with c/o chest pain, nausea, sweats, dizziness that started Thursday and states it has been getting "progressively worse" since then. Pt also c/o SOB.

## 2022-06-28 NOTE — ED Provider Notes (Signed)
[facility] AT [facility] Provider Note   CSN: [id number] Arrival date & time: 06/28/22  1945     History  Chief Complaint  Patient presents with   Chest Pain    Victoria Dean is a 72 y.o. female.   Chest Pain Patient presents with chest pain shortness of breath weakness.  States she feels unsteady.  States she feels that the world is slightly tipped.  States she also feels it moves around.  Began on Thursday with today being Saturday.  States she also has some difficulty getting around at baseline due to previous back pains.  Reviewing notes appears history of emphysema.  Not on oxygen.  States she has been having chills and felt very hot and had to have the air conditioning as low as it would go.    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 Authorizing Provider  acetaminophen (TYLENOL) 650 MG CR tablet Take 1,300 mg by mouth every 8 (eight) hours as needed for pain.    [provider]  albuterol (VENTOLIN HFA) 108 (90 Base) MCG/ACT inhaler Inhale 2 puffs into the lungs every 6 (six) hours as needed for wheezing or shortness of breath.    [provider]  Biotin 1000 MCG tablet Take 1,000 mcg by mouth daily.    [provider]  Calcium Carbonate-Vit D-Min (CALCIUM 1200 PO) Take 1 tablet by mouth 2 (two) times daily.    [provider]  cholecalciferol (VITAMIN D) 25 MCG (1000 UNIT) tablet Take 1,000 Units by mouth daily.    [provider]  cyanocobalamin (VITAMIN B12) 1000 MCG/ML injection Inject 1 mL (1,000 mcg total) into the muscle every 30 (thirty) days. 12/31/21   Creig Hines, MD  diazepam (VALIUM) 5 MG tablet Take 1 tablet (5 mg total) by mouth every 12 (twelve) hours as needed for anxiety. 07/23/20   Bosie Clos, MD  famotidine (PEPCID) 10 MG tablet Take by mouth. Take 10 mg by mouth 2 (two) times daily as needed for Heartburn    [provider]  hydrocortisone 2.5 % lotion Apply 1 application topically daily as needed (when washing face).    [provider]  letrozole [facility]) 2.5 MG tablet TAKE 1 TABLET EVERY DAY 01/22/22   Creig Hines, MD  lidocaine-prilocaine (EMLA) cream Apply to affected area once 09/07/20   Creig Hines, MD  loratadine (CLARITIN) 10 MG tablet Take by mouth. Take 10 mg by mouth once daily    [provider]  losartan (COZAAR) 100 MG tablet TAKE 1 TABLET EVERY DAY 05/10/21   Bosie Clos, MD  Menthol, Topical Analgesic, (BIOFREEZE EX) Apply 1 application topically daily as needed (back pain).  [provider]  mometasone (ELOCON) 0.1 % cream Apply 1 application topically daily as needed (ear irritation).    [provider]  Polyethyl Glycol-Propyl Glycol (SYSTANE OP) Place 1 drop into both eyes daily as needed (dry eyes).    [provider]  rosuvastatin (CRESTOR) 5 MG tablet Take 1 tablet (5 mg total) by mouth daily. 05/22/21   Bosie Clos, MD  Syringe/Needle, Disp, (SYRINGE 3CC/25GX1") 25G X 1" 3 ML MISC 1 Syringe by Does not apply route every 30 (thirty) days. 12/31/21   Creig Hines, MD  triamterene-hydrochlorothiazide (DYAZIDE) 37.5-25 MG capsule TAKE 1 CAPSULE EVERY DAY 10/24/21   Bosie Clos, MD  venlafaxine XR (EFFEXOR-XR) 75 MG 24 hr capsule TAKE 3 CAPSULES (225 MG TOTAL) BY MOUTH  DAILY WITH BREAKFAST. Patient taking differently: Take 150 mg by mouth daily with breakfast. 2 tablets at bedtime 01/10/21   Bosie Clos, MD  zoledronic acid (ZOMETA) 4 MG/5ML injection Inject 4 mg into the vein once.    [provider]      Allergies    Other, Statins, Zetia [ezetimibe], and Sulfa antibiotics    Review of Systems   Review of Systems  Cardiovascular:  Positive for chest pain.    Physical Exam Updated Vital Signs BP 124/69   Pulse 87   Temp 98 F (36.7 C) (Oral)   Resp 17   Ht 5\' 4"  (1.626 m)   Wt 112.5 kg   SpO2 94%   BMI 42.57 kg/m  Physical Exam Vitals and nursing note reviewed.  Cardiovascular:     Rate and Rhythm: Normal rate and regular rhythm.  Pulmonary:     Effort: Tachypnea present.     Comments: Pursed lip breathing. Chest:     Chest wall: No tenderness.  Abdominal:     Tenderness: There is no abdominal tenderness.  Musculoskeletal:     Right lower leg: No edema.     Left lower leg: No edema.  Neurological:     Mental Status: She is alert.     ED Results / Procedures / Treatments   Labs (all labs ordered are listed, but only abnormal results are displayed) Labs Reviewed  BASIC METABOLIC PANEL - Abnormal; Notable for the following components:      Result Value   Potassium 3.4 (*)    Chloride 97 (*)    Glucose, Bld 102 (*)    BUN 26 (*)    Creatinine, Ser 1.19 (*)    GFR, Estimated 48 (*)    All other components within normal limits  CBC - Abnormal; Notable for the following components:   RBC 3.75 (*)    Hemoglobin 11.2 (*)    HCT 34.7 (*)    All other components within normal limits  SARS CORONAVIRUS 2 BY RT PCR  D-DIMER, QUANTITATIVE  TROPONIN I (HIGH SENSITIVITY)  TROPONIN I (HIGH SENSITIVITY)    EKG EKG Interpretation  Date/Time:  Saturday Jun 28 2022 19:55:15 EDT Ventricular Rate:  94 PR Interval:  169 QRS Duration: 80 QT Interval:  368 QTC Calculation: 461 R Axis:   79 Text  Interpretation: Sinus tachycardia Premature ventricular complexes pvcs are new Confirmed by Benjiman Core [id number] on 06/28/2022 8:03:44 PM  Radiology CT ANGIO HEAD NECK W WO CM  Result Date: 06/28/2022 CLINICAL DATA:  Central vertigo EXAM: CT ANGIOGRAPHY HEAD AND NECK WITH AND WITHOUT CONTRAST TECHNIQUE: Multidetector CT imaging of the head and neck was performed using the standard protocol during bolus  administration of intravenous contrast. Multiplanar CT image reconstructions and MIPs were obtained to evaluate the vascular anatomy. Carotid stenosis measurements (when applicable) are obtained utilizing NASCET criteria, using the distal internal carotid diameter as the denominator. RADIATION DOSE REDUCTION: This exam was performed according to the departmental dose-optimization program which includes automated exposure control, adjustment of the mA and/or kV according to patient size and/or use of iterative reconstruction technique. CONTRAST:  75mL OMNIPAQUE IOHEXOL 350 MG/ML SOLN COMPARISON:  None Available. FINDINGS: CT HEAD FINDINGS Brain: There is no mass, hemorrhage or extra-axial collection. The size and configuration of the ventricles and extra-axial CSF spaces are normal. There is no acute or chronic infarction. The brain parenchyma is normal. Skull: The visualized skull base, calvarium and extracranial soft tissues are normal. Sinuses/Orbits: No fluid levels or advanced mucosal thickening of the visualized paranasal sinuses. No mastoid or middle ear effusion. The orbits are normal. CTA NECK FINDINGS SKELETON: There is no bony spinal canal stenosis. No lytic or blastic lesion. OTHER NECK: Enhancing focus superficial to the midline thyroid cartilage. 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 UPPER CHEST: No pneumothorax or pleural effusion. No nodules or masses. AORTIC ARCH: There is no calcific atherosclerosis of the aortic arch. There is no aneurysm, dissection or hemodynamically significant stenosis of the  visualized portion of the aorta. Conventional 3 vessel aortic branching pattern. The visualized proximal subclavian arteries are widely patent. RIGHT CAROTID SYSTEM: Normal without aneurysm, dissection or stenosis. LEFT CAROTID SYSTEM: Normal without aneurysm, dissection or stenosis. VERTEBRAL ARTERIES: Left dominant configuration. Both origins are clearly patent. There is no dissection, occlusion or flow-limiting stenosis to the skull base (V1-V3 segments). CTA HEAD FINDINGS POSTERIOR CIRCULATION: --Vertebral arteries: Normal V4 segments. --Inferior cerebellar arteries: Normal. --Basilar artery: Normal. --Superior cerebellar arteries: Normal. --Posterior cerebral arteries (PCA): Normal. ANTERIOR CIRCULATION: --Intracranial internal carotid arteries: Normal. --Anterior cerebral arteries (ACA): Normal. Both A1 segments are present. Patent anterior communicating artery (a-comm). --Middle cerebral arteries (MCA): Normal. VENOUS SINUSES: As permitted by contrast timing, patent. ANATOMIC VARIANTS: None Review of the MIP images confirms the above findings. IMPRESSION: 1. No emergent large vessel occlusion or high-grade stenosis of the intracranial arteries. 2. 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 Electronically Signed   By: Deatra Robinson M.D.   On: 06/28/2022 23:25   DG Chest Portable 1 View  Result Date: 06/28/2022 CLINICAL DATA:  CP and SOB EXAM: PORTABLE CHEST 1 VIEW COMPARISON:  Chest x-ray 01/19/2021 FINDINGS: The heart and mediastinal contours are unchanged. No focal consolidation. No pulmonary edema. No pleural effusion. No pneumothorax. No acute osseous abnormality. IMPRESSION: No active disease. Electronically Signed   By: Tish Frederickson M.D.   On: 06/28/2022 20:27    Procedures Procedures    Medications Ordered in ED Medications  ondansetron [facility]) injection 4 mg (4 mg Intravenous Given 06/28/22 2034)  sodium chloride 0.9 % bolus 500 mL (0 mLs  Intravenous Stopped 06/28/22 2246)  prochlorperazine (COMPAZINE) injection 5 mg (5 mg Intravenous Given 06/28/22 2247)  iohexol (OMNIPAQUE) 350 MG/ML injection 100 mL (75 mLs Intravenous Contrast Given 06/28/22 2255)    ED Course/ Medical Decision Making/ A&P                             Medical Decision Making Amount and/or Complexity of Data Reviewed Labs: ordered. Radiology: ordered.  Risk Prescription drug management.   Patient with chest pain shortness of breath fatigue chills.  Also feeling unsteady.  EKG shows  PVCs.  Differential diagnosis includes cardiac cause, pneumonia, infection.  Also with the dizziness central causes considered.  Will get basic blood work and chest x-ray to start with.  Chest x-ray reassuring.  Read by radiology as no infiltrate.  Blood work reassuring.  Although creatinine was mildly increased.  Negative D-dimer doubt pulm embolism.  Has had COPD with somewhat chronic shortness of breath and I think that is not all that different.  However with the feeling of dizziness including the nausea feeling that the room is tipped and the room is spinning does potentially cause a central vertigo.  Has had that since Thursday so not a TNK candidate.  With further questioning patient states she did have her face drooping earlier today.  States it was not symmetric but did look a little different than when she had her Bell's palsy before.  States it was the right side that was not normal.  CT angiography done and does not show large vessel occlusion or definite intracranial pathology.  However I think she will need stroke rule out with the vertigo and potential facial droop.  Does have nystagmus.  Will discuss with hospitalist for admission with likely transfer to [facility].        Final Clinical Impression(s) / ED Diagnoses Final diagnoses:  Nonspecific chest pain  Vertigo    Rx / DC Orders ED Discharge Orders     None         Benjiman Core,  MD 06/28/22 2343

## 2022-06-29 ENCOUNTER — Observation Stay (HOSPITAL_BASED_OUTPATIENT_CLINIC_OR_DEPARTMENT_OTHER): Payer: Medicare PPO

## 2022-06-29 ENCOUNTER — Observation Stay (HOSPITAL_COMMUNITY): Payer: Medicare PPO

## 2022-06-29 DIAGNOSIS — F411 Generalized anxiety disorder: Secondary | ICD-10-CM

## 2022-06-29 DIAGNOSIS — E876 Hypokalemia: Secondary | ICD-10-CM

## 2022-06-29 DIAGNOSIS — K219 Gastro-esophageal reflux disease without esophagitis: Secondary | ICD-10-CM

## 2022-06-29 DIAGNOSIS — R079 Chest pain, unspecified: Secondary | ICD-10-CM

## 2022-06-29 DIAGNOSIS — R42 Dizziness and giddiness: Secondary | ICD-10-CM | POA: Diagnosis not present

## 2022-06-29 DIAGNOSIS — E782 Mixed hyperlipidemia: Secondary | ICD-10-CM | POA: Insufficient documentation

## 2022-06-29 DIAGNOSIS — I10 Essential (primary) hypertension: Secondary | ICD-10-CM | POA: Diagnosis not present

## 2022-06-29 DIAGNOSIS — G459 Transient cerebral ischemic attack, unspecified: Secondary | ICD-10-CM

## 2022-06-29 DIAGNOSIS — E785 Hyperlipidemia, unspecified: Secondary | ICD-10-CM

## 2022-06-29 LAB — LIPID PANEL
Cholesterol: 124 mg/dL (ref 0–200)
HDL: 47 mg/dL (ref >40)
LDL Cholesterol: 62 mg/dL (ref 0–99)
Total CHOL/HDL Ratio: 2.6 RATIO
Triglycerides: 76 mg/dL (ref <150)
VLDL: 15 mg/dL (ref 0–40)

## 2022-06-29 LAB — ECHOCARDIOGRAM COMPLETE
AV Peak grad: 12.3 mmHg
Ao pk vel: 1.75 m/s
Area-P 1/2: 2.47 cm2
Height: 64 in
Weight: 3968 oz

## 2022-06-29 MED ORDER — LOSARTAN POTASSIUM 50 MG PO TABS
50.0000 mg | ORAL_TABLET | Freq: Every day | ORAL | Status: DC
  Administered 2022-06-29: 50 mg via ORAL
  Filled 2022-06-29: qty 1

## 2022-06-29 MED ORDER — HEPARIN SODIUM (PORCINE) 5000 UNIT/ML IJ SOLN
5000.0000 [IU] | Freq: Three times a day (TID) | INTRAMUSCULAR | Status: DC
  Administered 2022-06-29 (×2): 5000 [IU] via SUBCUTANEOUS
  Filled 2022-06-29: qty 1

## 2022-06-29 MED ORDER — SENNOSIDES-DOCUSATE SODIUM 8.6-50 MG PO TABS: 1.0000 | ORAL_TABLET | Freq: Every evening | ORAL | Status: DC | PRN

## 2022-06-29 MED ORDER — ACETAMINOPHEN 325 MG PO TABS: 650.0000 mg | ORAL_TABLET | ORAL | Status: DC | PRN

## 2022-06-29 MED ORDER — PERFLUTREN LIPID MICROSPHERE
1.0000 mL | INTRAVENOUS | Status: AC | PRN
  Administered 2022-06-29: 2 mL via INTRAVENOUS

## 2022-06-29 MED ORDER — ACETAMINOPHEN 160 MG/5ML PO SOLN: 650.0000 mg | ORAL | Status: DC | PRN

## 2022-06-29 MED ORDER — STROKE: EARLY STAGES OF RECOVERY BOOK: Freq: Once | Status: DC

## 2022-06-29 MED ORDER — FAMOTIDINE 20 MG PO TABS
10.0000 mg | ORAL_TABLET | Freq: Two times a day (BID) | ORAL | Status: DC
  Administered 2022-06-29: 10 mg via ORAL
  Filled 2022-06-29: qty 1

## 2022-06-29 MED ORDER — TRIAMTERENE-HCTZ 37.5-25 MG PO TABS: 1.0000 | ORAL_TABLET | Freq: Every day | ORAL | Status: DC

## 2022-06-29 MED ORDER — ACETAMINOPHEN 650 MG RE SUPP: 650.0000 mg | RECTAL | Status: DC | PRN

## 2022-06-29 MED ORDER — VENLAFAXINE HCL ER 75 MG PO CP24
150.0000 mg | ORAL_CAPSULE | Freq: Every day | ORAL | Status: DC
  Administered 2022-06-29: 150 mg via ORAL
  Filled 2022-06-29: qty 2

## 2022-06-29 MED ORDER — POTASSIUM CHLORIDE 10 MEQ/100ML IV SOLN
10.0000 meq | INTRAVENOUS | Status: AC
  Administered 2022-06-29 (×2): 10 meq via INTRAVENOUS
  Filled 2022-06-29 (×2): qty 100

## 2022-06-29 MED ORDER — ASPIRIN 81 MG PO TBEC: 81.0000 mg | DELAYED_RELEASE_TABLET | Freq: Every day | ORAL | 2 refills | Status: DC

## 2022-06-29 MED ORDER — ROSUVASTATIN CALCIUM 5 MG PO TABS
5.0000 mg | ORAL_TABLET | Freq: Every day | ORAL | Status: DC
  Administered 2022-06-29: 5 mg via ORAL
  Filled 2022-06-29: qty 1

## 2022-06-29 MED ORDER — DIAZEPAM 5 MG PO TABS
5.0000 mg | ORAL_TABLET | Freq: Two times a day (BID) | ORAL | Status: DC | PRN
  Administered 2022-06-29: 5 mg via ORAL
  Filled 2022-06-29: qty 1

## 2022-06-29 NOTE — Assessment & Plan Note (Signed)
-   Continue Valium 

## 2022-06-29 NOTE — ED Notes (Signed)
[facility] called for transport. 

## 2022-06-29 NOTE — H&P (Signed)
History and Physical    Patient: Victoria Dean VVO:[id number] DOB: 1950-07-14 DOA: 06/28/2022 DOS: the patient was seen and examined on 06/29/2022 PCP: Oneal Grout, FNP  Patient coming from: Home  Chief Complaint:  Chief Complaint  Patient presents with   Chest Pain   HPI: Victoria Dean is a 72 y.o. female with medical history significant of anemia, hypertension, hyperlipidemia, depression, breast cancer, GERD, anxiety, and more presents the ED with a chief complaint of dyspnea.  Patient reports that she had weakness dyspnea, chest pain.  She had a history of chest pain which has been determined to be GERD by her PCP.  She reports today it was similar but different.  Usually her GERD pain was relieved by Tums.  All weakness has not been relieved by Tums.  FolicA tightness around her chest.  It was intermittent.  It was worse with exertion.  Resting was better.  She was short of breath, but that is been a chronic problem.  She had some nausea but no vomiting.  She had some dizziness that she describes as feeling like the world was tilted but she was not and she could not find her feet.  She reports palpitations.  The palpitations were not necessarily worse with exertion, and were unpredictable.  Patient has had some issues with dyspnea and has followed with pulmonology.  She reports being told she had stage III COPD and started on Breztri, Stiolto, and Trelegy.  She reports she is not taking any of those medications because she cannot tolerate them.  She does still have a rescue inhaler albuterol.  Lastly patient reports she had some facial droop today.  She describes it as the left being line on the right side of her face being softer.  She does have a history of Bell's palsy on that same side.  It was similar but not the same.  Her daughter was with her today and did not notice it.  Her Bell's palsy episode was in 2008.  Patient reports on review of systems that she has had some left leg cramping.  It  feels like it is going from her ankle to her knee and like her foot is getting pulled inwards.  Patient has no other complaints at this time.  Patient reports that she would want to be DNR.  Review of Systems: As mentioned in the history of present illness. All other systems reviewed and are negative. 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    Vitamin D deficiency    Past Surgical History:  Procedure Laterality Date   ABDOMINAL HYSTERECTOMY  1995   APPENDECTOMY  1994   BREAST BIOPSY Right 1998   neg   BREAST BIOPSY Left 05/30/2020   stere bx x clip path pending   BREAST LUMPECTOMY WITH SENTINEL LYMPH NODE BIOPSY Left 06/29/2020   Procedure: BREAST LUMPECTOMY WITH  SENTINEL LYMPH NODE BX;  Surgeon: Earline Mayotte, MD;  Location: [facility] ORS;  Service: General;  Laterality: Left;   CATARACT EXTRACTION W/PHACO Left 01/31/2022   Procedure: CATARACT EXTRACTION PHACO AND INTRAOCULAR LENS PLACEMENT (IOC);  Surgeon: Fabio Pierce, MD;  Location: [facility];  Service: Ophthalmology;  Laterality: Left;  CDE 8.10   CATARACT EXTRACTION W/PHACO Right 02/21/2022   Procedure: CATARACT EXTRACTION PHACO AND INTRAOCULAR LENS PLACEMENT (IOC);  Surgeon: Fabio Pierce, MD;  Location: [facility];  Service: Ophthalmology;  Laterality: Right;  CDE: 9.42   ESI     LUMBAR MICRODISCECTOMY  2019   L3-4   LUMBAR MICRODISCECTOMY  2008   L4-L5 repeated   MICRODISCECTOMY LUMBAR  2008   L4-5   MICRODISCECTOMY LUMBAR  2010   L4-L5 repeated   NODE  DISSECTION Left 08/13/2020   Procedure: AXILLARY NODE DISSECTION;  Surgeon: Earline Mayotte, MD;  Location: [facility] ORS;  Service: General;  Laterality: Left;   PORTACATH PLACEMENT Right 09/05/2020   Procedure: INSERTION PORT-A-CATH;  Surgeon: Earline Mayotte, MD;  Location: [facility] ORS;  Service: General;  Laterality: Right;  right; monitor anesthesia care   SQUAMOUS CELL CARCINOMA EXCISION     TONSILLECTOMY  1971   Social History:  reports that she quit smoking about 4 years ago. Her smoking use included cigarettes. She has a 30.03 pack-year smoking history. She has never used smokeless tobacco. She reports current alcohol use. She reports that she does not use drugs.  Allergies  Allergen Reactions   Other Itching    Brand name bandaids with cloth backing causes redness and itching   Statins     Muscle weakness   Zetia [Ezetimibe]     Leg pain, upset stomach   Sulfa Antibiotics Rash    Family History  Problem Relation Age of Onset   Breast cancer Mother 7   Osteosarcoma Mother        d. 75   Hypertension Brother    COPD Father    Hypertension Father    Breast cancer Paternal Aunt        dx 49s   Breast cancer Paternal Grandmother        dx 76s   Stomach cancer Paternal Grandfather        dx 25s   Breast cancer Paternal Aunt        dx 45s   Brain cancer Paternal Aunt        dx 59s    Prior to Admission medications   Medication Sig Start Date End Date Taking? Authorizing Provider  acetaminophen (TYLENOL) 650 MG CR tablet Take 1,300 mg by mouth every 8 (eight) hours as needed for pain.    [provider]  albuterol (VENTOLIN HFA) 108 (90 Base) MCG/ACT inhaler Inhale 2 puffs into the lungs every 6 (six) hours as needed for wheezing or shortness of breath.    [provider]  Biotin 1000 MCG tablet Take 1,000 mcg by mouth daily.    [provider]  Calcium Carbonate-Vit D-Min (CALCIUM 1200 PO) Take 1 tablet by mouth 2 (two) times daily.    [provider]  cholecalciferol (VITAMIN D) 25 MCG (1000 UNIT) tablet Take 1,000 Units by mouth daily.    [provider]  cyanocobalamin (VITAMIN B12) 1000 MCG/ML injection Inject 1 mL (1,000 mcg total) into the muscle every 30 (thirty) days. 12/31/21   Creig Hines, MD  diazepam (VALIUM) 5 MG tablet Take 1 tablet (5 mg total) by  mouth every 12 (twelve) hours as needed for anxiety. 07/23/20   Bosie Clos, MD  famotidine (PEPCID) 10 MG tablet Take by mouth. Take 10 mg by mouth 2 (two) times daily as needed for Heartburn    [provider]  hydrocortisone 2.5 % lotion Apply 1 application topically daily as needed (when washing face).    [provider]  letrozole [facility]) 2.5 MG tablet TAKE 1 TABLET EVERY DAY 01/22/22   Creig Hines, MD  lidocaine-prilocaine (EMLA) cream Apply to affected area once 09/07/20   Creig Hines, MD  loratadine (CLARITIN) 10 MG tablet Take by mouth. Take 10 mg by mouth once daily    [provider]  losartan (COZAAR) 100 MG tablet TAKE 1 TABLET EVERY DAY 05/10/21   Bosie Clos, MD  Menthol, Topical Analgesic, (BIOFREEZE EX) Apply 1 application topically daily as needed (back pain).    [provider]  mometasone (ELOCON) 0.1 % cream Apply 1 application topically daily as needed (ear irritation).    [provider]  Polyethyl Glycol-Propyl Glycol (SYSTANE OP) Place 1 drop into both eyes daily as needed (dry eyes).    [provider]  rosuvastatin (CRESTOR) 5 MG tablet Take 1 tablet (5 mg total) by mouth daily. 05/22/21   Bosie Clos, MD  Syringe/Needle, Disp, (SYRINGE 3CC/25GX1") 25G X 1" 3 ML MISC 1 Syringe by Does not apply route every 30 (thirty) days. 12/31/21   Creig Hines, MD  triamterene-hydrochlorothiazide (DYAZIDE) 37.5-25 MG capsule TAKE 1 CAPSULE EVERY DAY 10/24/21   Bosie Clos, MD  venlafaxine XR (EFFEXOR-XR) 75 MG 24 hr capsule TAKE 3 CAPSULES (225 MG TOTAL) BY MOUTH  DAILY WITH BREAKFAST. Patient taking differently: Take 150 mg by mouth daily with breakfast. 2 tablets at bedtime 01/10/21   Bosie Clos, MD  zoledronic acid (ZOMETA) 4 MG/5ML injection Inject 4 mg into the vein once.    [provider]    Physical Exam: Vitals:   06/29/22 0000 06/29/22 0100 06/29/22 0200 06/29/22 0230  BP: 129/64 131/77 137/76 (!) 143/79  Pulse: 84 88 87 83  Resp: 19 14 19 19   Temp:      TempSrc:      SpO2: 94% 96% 90% 93%  Weight:      Height:       1.  General: Patient lying supine in bed,  no acute distress   2. Psychiatric: Alert and oriented x 3, mood and behavior normal for situation, pleasant and cooperative with exam   3. Neurologic: Speech and language are normal, face is symmetric, moves all 4 extremities voluntarily, at baseline without acute deficits on limited exam   4. HEENMT:  Head is atraumatic, normocephalic, pupils reactive to light, neck is supple, trachea is midline, mucous membranes are moist   5. Respiratory : Lungs are clear to auscultation bilaterally without wheezing, rhonchi, rales, no cyanosis, no increase in work of breathing or accessory muscle use   6. Cardiovascular : Heart rate normal, rhythm is regular, no murmurs, rubs or gallops, no peripheral edema, peripheral pulses palpated   7. Gastrointestinal:  Abdomen is soft, nondistended, nontender to palpation bowel sounds active, no masses or organomegaly palpated   8. Skin:  Skin is warm, dry and intact without rashes, acute lesions, or ulcers on limited exam   9.Musculoskeletal:  No acute deformities or trauma, no asymmetry in tone, no peripheral edema, peripheral pulses palpated, no tenderness to palpation in the extremities  Data  Reviewed: In the ED Temp 98, heart rate 82-92, respiratory rate 17-24, blood pressure 112/55-151/72, satting 94-99% No leukocytosis with a white blood cell count of 7.6, hemoglobin 11.2 Chemistry reveals a hypokalemia at  3.4 Troponin normal x 2 D-dimer negative CTA head and neck shows no emergent large vessel occlusions, possible parathyroid adenoma Admission requested for TIA workup  Assessment and Plan: * TIA (transient ischemic attack) - Reports facial droop, vertigo - CTA head and neck shows no emergent large vessel occlusion or intracranial acute changes - 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 - Hopefully able to discharge the afternoon of the 26th  Hyperlipidemia - Continue Crestor  GERD (gastroesophageal reflux disease) - Continue Pepcid  Hypokalemia - Chronically low - 3.4 today - Continue to replace with 20 mEq of potassium - Trend in the a.m. - Monitor on telemetry  Anxiety, generalized - Continue Valium  Essential (primary) hypertension - Holding losartan and Dyazide for permissive hypertension      Advance Care Planning:   Code Status: DNR  Consults: Neurology  Family Communication: Daughter at bedside  Severity of Illness: The appropriate patient status for this patient is OBSERVATION. Observation status is judged to be reasonable and necessary in order to provide the required intensity of service to ensure the patient's safety. The patient's presenting symptoms, physical exam findings, and initial radiographic and laboratory data in the context of their medical condition is felt to place them at decreased risk for further clinical deterioration. Furthermore, it is anticipated that the patient will be medically stable for discharge from the hospital within 2 midnights of admission.   Author: Lilyan Gilford, DO 06/29/2022 2:37 AM  For on call review [email or web address].

## 2022-06-29 NOTE — Progress Notes (Signed)
PROGRESS NOTE        PATIENT DETAILS Name: Victoria Dean Age: 72 y.o. Sex: female Date of Birth: 03-27-1950 Admit Date: 06/28/2022 Admitting Physician Lilyan Gilford, DO ZOX:[id number], Cephas Darby, FNP  Brief Summary: Patient is a 72 y.o.  female with history of breast cancer now on letrozole, COPD, HLD, depression, GERD who presented to [facility] with worsening chest pain (has chronic/recurrent chest pain from GERD)-she also complaining of right facial weakness that lasted for approximately 15-20 minutes.  She was then transferred to [facility] for TIA/stroke workup.  Significant events: 5/25>> admit to [facility]-initially presented to [facility] ED.  Significant studies: 5/25>> CXR: No active disease. 5/25>> D-dimer:<0.27 5/25>> CT angio head/neck: No LVO/high-grade stenosis. 5/26>> MRI brain: No acute CVA. 5/26>> LDL: 62 5/26>> A1c: Pending  Significant microbiology data: 5/25>> COVID PCR: Negative  Procedures: None  Consults: Neurology  Subjective: Lying comfortably in bed-denies any chest pain or shortness of breath at rest.  Claims that she has chronic shortness of breath mostly with ambulation that has been unchanged for the past several months.  Denies any chest pain today.  Her right facial weakness resolved yesterday-lasted approximately 15-20 minutes.  Objective: Vitals: Blood pressure (!) 142/77, pulse 86, temperature 98.2 F (36.8 C), temperature source Oral, resp. rate 18, height 5\' 4"  (1.626 m), weight 112.5 kg, SpO2 93 %.   Exam: Gen Exam:Alert awake-not in any distress HEENT:atraumatic, normocephalic Chest: B/L clear to auscultation anteriorly CVS:S1S2 regular Abdomen:soft non tender, non distended Extremities:no edema Neurology: Non focal Skin: no rash  Pertinent Labs/Radiology:    Latest Ref Rng & Units 06/28/2022    8:03 PM 04/21/2022   12:52 PM 01/15/2022    1:43 PM  CBC  WBC 4.0 - 10.5 K/uL 7.6  7.7  6.9   Hemoglobin 12.0 - 15.0 g/dL  54.0  98.1  19.1   Hematocrit 36.0 - 46.0 % 34.7  35.3  34.8   Platelets 150 - 400 K/uL 246  263  231     Lab Results  Component Value Date   NA 135 06/28/2022   K 3.4 (L) 06/28/2022   CL 97 (L) 06/28/2022   CO2 26 06/28/2022    Assessment/Plan: TIA Right facial weakness has resolved Workup as above Echo pending Aspirin/statin Appreciate neuro input.  Exertional dyspnea This is a chronic issue for the patient apparently has been going on for several months Her lungs are completely clear on exam-not suggestive of COPD flare She has no signs of volume overload on exam suggestive of CHF Her D-dimer was negative-unlikely to be VTE given long duration of symptoms She apparently follows closely with pulmonology-but relates to me this morning that she is unhappy with the pulmonologist and would want to change providers She unfortunately has had severe allergic reaction to almost every inhaler regimen-and only uses albuterol for rescue. Plan would be at this point to await echocardiogram Since this is a chronic issue that has essentially been going on for several months-suspect this could be continue to be monitored in the outpatient setting.  Atypical chest pain Has resolved Per history has longstanding chest pain secondary to GERD-per patient-chest pain on presentation yesterday was similar to GERD but a bit more severe. Troponins negative Await echo Continue Pepcid.  HLD Statin  HTN Resume losartan/Maxide  GERD As above  Anxiety disorder Continue as  needed Valium  Morbid Obesity: Estimated body mass index is 42.57 kg/m as calculated from the following:   Height as of this encounter: 5\' 4"  (1.626 m).   Weight as of this encounter: 112.5 kg.   Code status:   Code Status: DNR   DVT Prophylaxis: heparin injection 5,000 Units Start: 06/29/22 0600 SCD's Start: 06/29/22 0420   Family Communication: None at bedside   Disposition Plan: Status is: Observation The  patient remains OBS appropriate and will d/c before 2 midnights.   Planned Discharge Destination:Home health   Diet: Diet Order             Diet Heart Room service appropriate? Yes; Fluid consistency: Thin  Diet effective now                     Antimicrobial agents: Anti-infectives (From admission, onward)    None        MEDICATIONS: Scheduled Meds:  [START ON 06/30/2022]  stroke: early stages of recovery book   Does not apply Once   famotidine  10 mg Oral BID   heparin  5,000 Units Subcutaneous Q8H   rosuvastatin  5 mg Oral Daily   venlafaxine XR  150 mg Oral Q breakfast   Continuous Infusions: PRN Meds:.acetaminophen **OR** acetaminophen (TYLENOL) oral liquid 160 mg/5 mL **OR** acetaminophen, diazepam, perflutren lipid microspheres (DEFINITY) IV suspension, senna-docusate   I have personally reviewed following labs and imaging studies  LABORATORY DATA: CBC: Recent Labs  Lab 06/28/22 2003  WBC 7.6  HGB 11.2*  HCT 34.7*  MCV 92.5  PLT 246    Basic Metabolic Panel: Recent Labs  Lab 06/28/22 2003  NA 135  K 3.4*  CL 97*  CO2 26  GLUCOSE 102*  BUN 26*  CREATININE 1.19*  CALCIUM 9.3    GFR: Estimated Creatinine Clearance: 51.7 mL/min (A) (by C-G formula based on SCr of 1.19 mg/dL (H)).  Liver Function Tests: No results for input(s): "AST", "ALT", "ALKPHOS", "BILITOT", "PROT", "ALBUMIN" in the last 168 hours. No results for input(s): "LIPASE", "AMYLASE" in the last 168 hours. No results for input(s): "AMMONIA" in the last 168 hours.  Coagulation Profile: No results for input(s): "INR", "PROTIME" in the last 168 hours.  Cardiac Enzymes: No results for input(s): "CKTOTAL", "CKMB", "CKMBINDEX", "TROPONINI" in the last 168 hours.  BNP (last 3 results) No results for input(s): "PROBNP" in the last 8760 hours.  Lipid Profile: Recent Labs    06/29/22 0430  CHOL 124  HDL 47  LDLCALC 62  TRIG 76  CHOLHDL 2.6    Thyroid Function  Tests: No results for input(s): "TSH", "T4TOTAL", "FREET4", "T3FREE", "THYROIDAB" in the last 72 hours.  Anemia Panel: No results for input(s): "VITAMINB12", "FOLATE", "FERRITIN", "TIBC", "IRON", "RETICCTPCT" in the last 72 hours.  Urine analysis:    Component Value Date/Time   COLORURINE YELLOW 01/21/2019 1629   APPEARANCEUR HAZY (A) 01/21/2019 1629   LABSPEC 1.015 01/21/2019 1629   PHURINE 7.0 01/21/2019 1629   GLUCOSEU NEGATIVE 01/21/2019 1629   HGBUR NEGATIVE 01/21/2019 1629   BILIRUBINUR NEGATIVE 01/21/2019 1629   BILIRUBINUR negative 10/27/2018 1012   KETONESUR NEGATIVE 01/21/2019 1629   PROTEINUR NEGATIVE 01/21/2019 1629   UROBILINOGEN 0.2 10/27/2018 1012   NITRITE NEGATIVE 01/21/2019 1629   LEUKOCYTESUR NEGATIVE 01/21/2019 1629    Sepsis Labs: Lactic Acid, Venous No results found for: "LATICACIDVEN"  MICROBIOLOGY: Recent Results (from the past 240 hour(s))  SARS Coronavirus 2 by RT PCR (hospital order, performed in  [facility] hospital lab) *cepheid single result test* Anterior Nasal Swab     Status: None   Collection Time: 06/28/22  8:37 PM   Specimen: Anterior Nasal Swab  Result Value Ref Range Status   SARS Coronavirus 2 by RT PCR NEGATIVE NEGATIVE Final    Comment: (NOTE) SARS-CoV-2 target nucleic acids are NOT DETECTED.  The SARS-CoV-2 RNA is generally detectable in upper and lower respiratory specimens during the acute phase of infection. The lowest concentration of SARS-CoV-2 viral copies this assay can detect is 250 copies / mL. A negative result does not preclude SARS-CoV-2 infection and should not be used as the sole basis for treatment or other patient management decisions.  A negative result may occur with improper specimen collection / handling, submission of specimen other than nasopharyngeal swab, presence of viral mutation(s) within the areas targeted by this assay, and inadequate number of viral copies (<250 copies / mL). A negative result must  be combined with clinical observations, patient history, and epidemiological information.  Fact Sheet for Patients:   RoadLapTop.co.za  Fact Sheet for Healthcare Providers: http://kim-miller.com/  This test is not yet approved or  cleared by the [place] FDA and has been authorized for detection and/or diagnosis of SARS-CoV-2 by FDA under an Emergency Use Authorization (EUA).  This EUA will remain in effect (meaning this test can be used) for the duration of the COVID-19 declaration under Section 564(b)(1) of the Act, 21 U.S.C. section 360bbb-3(b)(1), unless the authorization is terminated or revoked sooner.  Performed at [facility], [address] [place], [place] [address]     RADIOLOGY STUDIES/RESULTS: ECHOCARDIOGRAM COMPLETE  Result Date: 06/29/2022    ECHOCARDIOGRAM REPORT   Patient Name:   NALINI RICKEY Date of Exam: 06/29/2022 Medical Rec #:  [id number]    Height:       64.0 in Accession #:    [id number]   Weight:       248.0 lb Date of Birth:  [birth year]   BSA:          2.144 m Patient Age:    71 years     BP:           142/77 mmHg Patient Gender: F            HR:           97 bpm. Exam Location:  Inpatient Procedure: 2D Echo, Color Doppler and Cardiac Doppler Indications:    TIA  History:        Patient has prior history of Echocardiogram examinations, most                 recent 10/16/2020. Arrythmias:PVC; Risk Factors:Hypertension.  Sonographer:    Darlys Gales Referring Phys: [phone] [facility]  Sonographer Comments: No parasternal window and Technically difficult study due to poor echo windows. IMPRESSIONS  1. Poor acoustic windows limits utility of study.  2. Left ventricular ejection fraction, by estimation, is 60 to 65%. The left ventricle has normal function. The left ventricle has no regional wall motion abnormalities. Left ventricular diastolic parameters are indeterminate.  3. Right ventricular systolic function was not  well visualized. The right ventricular size is normal.  4. The mitral valve was not well visualized. No evidence of mitral valve regurgitation. No evidence of mitral stenosis.  5. Appears may be sclerotic with small gradient but no significant AS. The aortic valve was not well visualized. Aortic valve regurgitation is not visualized. No aortic stenosis is present.  6. The inferior vena cava is normal in size with greater than 50% respiratory variability, suggesting right atrial pressure of 3 mmHg. FINDINGS  Left Ventricle: Left ventricular ejection fraction, by estimation, is 60 to 65%. The left ventricle has normal function. The left ventricle has no regional wall motion abnormalities. Definity contrast agent was given IV to delineate the left ventricular  endocardial borders. The left ventricular internal cavity size was normal in size. There is no left ventricular hypertrophy. Left ventricular diastolic parameters are indeterminate. Right Ventricle: The right ventricular size is normal. No increase in right ventricular wall thickness. Right ventricular systolic function was not well visualized. Left Atrium: Left atrial size was not well visualized. Right Atrium: Right atrial size was not well visualized. Pericardium: There is no evidence of pericardial effusion. Mitral Valve: The mitral valve was not well visualized. No evidence of mitral valve regurgitation. No evidence of mitral valve stenosis. Tricuspid Valve: The tricuspid valve is not well visualized. Tricuspid valve regurgitation is not demonstrated. No evidence of tricuspid stenosis. Aortic Valve: Appears may be sclerotic with small gradient but no significant AS. The aortic valve was not well visualized. Aortic valve regurgitation is not visualized. No aortic stenosis is present. Aortic valve peak gradient measures 12.2 mmHg. Pulmonic Valve: The pulmonic valve was not well visualized. Pulmonic valve regurgitation is not visualized. No evidence of pulmonic  stenosis. Aorta: The aortic root is normal in size and structure and the aortic root was not well visualized. Venous: The inferior vena cava is normal in size with greater than 50% respiratory variability, suggesting right atrial pressure of 3 mmHg. IAS/Shunts: No atrial level shunt detected by color flow Doppler. Additional Comments: Poor acoustic windows limits utility of study.   Diastology LV e' medial:    4.03 cm/s LV E/e' medial:  31.3 LV e' lateral:   7.40 cm/s LV E/e' lateral: 17.0  RIGHT VENTRICLE RV S prime:     12.50 cm/s TAPSE (M-mode): 1.9 cm LEFT ATRIUM           Index LA Vol (A4C): 50.5 ml 23.56 ml/m  AORTIC VALVE AV Vmax:      175.00 cm/s AV Peak Grad: 12.2 mmHg LVOT Vmax:    127.00 cm/s LVOT Vmean:   93.400 cm/s LVOT VTI:     0.311 m MITRAL VALVE MV Area (PHT): 2.47 cm     SHUNTS MV E velocity: 126.00 cm/s  Systemic VTI: 0.31 m MV A velocity: 117.00 cm/s MV E/A ratio:  1.08 Charlton Haws MD Electronically signed by Charlton Haws MD Signature Date/Time: 06/29/2022/1:02:34 PM    Final    MR BRAIN WO CONTRAST  Result Date: 06/29/2022 CLINICAL DATA:  TIA EXAM: MRI HEAD WITHOUT CONTRAST TECHNIQUE: Multiplanar, multiecho pulse sequences of the brain and surrounding structures were obtained without intravenous contrast. COMPARISON:  Head CT and CTA from yesterday FINDINGS: Brain: No acute infarction, hemorrhage, hydrocephalus, extra-axial collection or mass lesion. Mild chronic small vessel ischemia in the cerebral white matter, less than commonly seen for patient age. Normal brain volume Vascular: Major flow voids are preserved. Skull and upper cervical spine: Normal marrow signal. Sinuses/Orbits: Bilateral cataract resection IMPRESSION: No acute finding including infarct.  Unremarkable study for age. Electronically Signed   By: Tiburcio Pea M.D.   On: 06/29/2022 07:32   CT ANGIO HEAD NECK W WO CM  Result Date: 06/28/2022 CLINICAL DATA:  Central vertigo EXAM: CT ANGIOGRAPHY HEAD AND NECK WITH  AND WITHOUT CONTRAST TECHNIQUE: Multidetector CT imaging of the head  and neck was performed using the standard protocol during bolus administration of intravenous contrast. Multiplanar CT image reconstructions and MIPs were obtained to evaluate the vascular anatomy. Carotid stenosis measurements (when applicable) are obtained utilizing NASCET criteria, using the distal internal carotid diameter as the denominator. RADIATION DOSE REDUCTION: This exam was performed according to the departmental dose-optimization program which includes automated exposure control, adjustment of the mA and/or kV according to patient size and/or use of iterative reconstruction technique. CONTRAST:  75mL OMNIPAQUE IOHEXOL 350 MG/ML SOLN COMPARISON:  None Available. FINDINGS: CT HEAD FINDINGS Brain: There is no mass, hemorrhage or extra-axial collection. The size and configuration of the ventricles and extra-axial CSF spaces are normal. There is no acute or chronic infarction. The brain parenchyma is normal. Skull: The visualized skull base, calvarium and extracranial soft tissues are normal. Sinuses/Orbits: No fluid levels or advanced mucosal thickening of the visualized paranasal sinuses. No mastoid or middle ear effusion. The orbits are normal. CTA NECK FINDINGS SKELETON: There is no bony spinal canal stenosis. No lytic or blastic lesion. OTHER NECK: Enhancing focus superficial to the midline thyroid cartilage. 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 UPPER CHEST: No pneumothorax or pleural effusion. No nodules or masses. AORTIC ARCH: There is no calcific atherosclerosis of the aortic arch. There is no aneurysm, dissection or hemodynamically significant stenosis of the visualized portion of the aorta. Conventional 3 vessel aortic branching pattern. The visualized proximal subclavian arteries are widely patent. RIGHT CAROTID SYSTEM: Normal without aneurysm, dissection or stenosis. LEFT CAROTID SYSTEM: Normal without aneurysm, dissection or stenosis.  VERTEBRAL ARTERIES: Left dominant configuration. Both origins are clearly patent. There is no dissection, occlusion or flow-limiting stenosis to the skull base (V1-V3 segments). CTA HEAD FINDINGS POSTERIOR CIRCULATION: --Vertebral arteries: Normal V4 segments. --Inferior cerebellar arteries: Normal. --Basilar artery: Normal. --Superior cerebellar arteries: Normal. --Posterior cerebral arteries (PCA): Normal. ANTERIOR CIRCULATION: --Intracranial internal carotid arteries: Normal. --Anterior cerebral arteries (ACA): Normal. Both A1 segments are present. Patent anterior communicating artery (a-comm). --Middle cerebral arteries (MCA): Normal. VENOUS SINUSES: As permitted by contrast timing, patent. ANATOMIC VARIANTS: None Review of the MIP images confirms the above findings. IMPRESSION: 1. No emergent large vessel occlusion or high-grade stenosis of the intracranial arteries. 2. 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 Electronically Signed   By: Deatra Robinson M.D.   On: 06/28/2022 23:25   DG Chest Portable 1 View  Result Date: 06/28/2022 CLINICAL DATA:  CP and SOB EXAM: PORTABLE CHEST 1 VIEW COMPARISON:  Chest x-ray 01/19/2021 FINDINGS: The heart and mediastinal contours are unchanged. No focal consolidation. No pulmonary edema. No pleural effusion. No pneumothorax. No acute osseous abnormality. IMPRESSION: No active disease. Electronically Signed   By: Tish Frederickson M.D.   On: 06/28/2022 20:27     LOS: 1 day   Jeoffrey Massed, MD  [facility]    To contact the attending provider between 7A-7P or the covering provider during after hours 7P-7A, please log into the web site [email or web address] and access using universal [place] password for that web site. If you do not have the password, please call the hospital operator.  06/29/2022, 1:31 PM

## 2022-06-29 NOTE — Plan of Care (Signed)

## 2022-06-29 NOTE — Consult Note (Signed)
Neurology Consultation  Reason for Consult: possible right facial asymmetry, dizziness Referring Physician: Dr. Jerral Ralph  CC: Chest discomfort, chest tightness, shortness of breath, possible right facial asymmetry and dizziness-concern for TIA  History is obtained from: Patient, chart  HPI: Victoria Dean is a 72 y.o. female who has a past medical history of hypertension, hyperlipidemia, depression, prior breast cancer, emphysema presented to outside hospital with concerns of chest pain and shortness of breath.  This was earlier determined to be GERD but the discomfort and shortness of breath was more than usual.  This had been going on for the past week.  Upon evaluation in the outside ER, she told them that she is also having some dizziness which she describes as lightheadedness and feeling of passing out more so when she stands up but it can also happen sometimes when she is sitting down.  She has a history of Bell's palsy on the right side with complete recovery-that was 2008. She is also been having some cramping in her legs off-and-on. She was transferred to [facility] for further evaluation with an echocardiogram and imaging after CT angio head and neck was completed at the outside hospital with no acute findings. There was a concern for TIA. My history from the patient does not truly localize to 1 vascular territory although nonspecific symptoms of dizziness can be related to strokes anywhere in the brain.    LKW: 7 days ago IV thrombolysis given?: no, outside the window EVT: No ELVO Premorbid modified Rankin scale (mRS): 1   ROS: Full ROS was performed and is negative except as noted in the HPI.   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 74   Hypertension Brother    COPD Father    Hypertension Father    Breast cancer Paternal Aunt        dx 44s   Breast cancer Paternal Grandmother        dx 39s   Stomach cancer Paternal Grandfather        dx 80s   Breast cancer Paternal Aunt        dx 72s   Brain cancer Paternal Aunt        dx 79s     Social History:   reports that she quit smoking about 4 years ago. Her smoking use included cigarettes. She has a 30.03 pack-year smoking history. She has never used smokeless tobacco. She reports current alcohol use. She reports that she does not use drugs.  Medications  Current Facility-Administered Medications:    [START ON 06/30/2022]  stroke: early stages of recovery book, , Does not apply, Once, Zierle-Ghosh, [place] B, DO  acetaminophen (TYLENOL) tablet 650 mg, 650 mg, Oral, Q4H PRN **OR** acetaminophen (TYLENOL) 160 MG/5ML solution 650 mg, 650 mg, Per Tube, Q4H PRN **OR** acetaminophen (TYLENOL) suppository 650 mg, 650 mg, Rectal, Q4H PRN, Zierle-Ghosh, Asia B, DO   diazepam (VALIUM) tablet 5 mg, 5 mg, Oral, Q12H PRN, Zierle-Ghosh, Asia B, DO, 5 mg at 06/29/22 1610   famotidine (PEPCID) tablet 10 mg, 10 mg, Oral, BID, Zierle-Ghosh, Asia B, DO   heparin injection 5,000 Units, 5,000 Units, Subcutaneous, Q8H, Zierle-Ghosh, Asia B, DO, 5,000 Units at 06/29/22 0551   rosuvastatin (CRESTOR) tablet 5 mg, 5 mg, Oral, Daily, Zierle-Ghosh, Asia B, DO   senna-docusate (Senokot-S) tablet 1 tablet, 1 tablet, Oral, QHS PRN, Zierle-Ghosh, Asia B, DO   venlafaxine XR (EFFEXOR-XR) 24 hr capsule 150 mg, 150 mg, Oral, Q  breakfast, Zierle-Ghosh, Asia B, DO  Exam: Current vital signs: BP (!) 143/79   Pulse 83   Temp 98.2 F (36.8 C) (Oral)   Resp 19   Ht 5\' 4"  (1.626 m)   Wt 112.5 kg   SpO2 93%   BMI 42.57 kg/m  Vital signs in last 24 hours: Temp:  [98 F (36.7 C)-98.2 F (36.8 C)] 98.2 F (36.8 C) (05/26 0400) Pulse Rate:  [82-92] 83 (05/26 0230) Resp:  [14-24] 19 (05/26 0230) BP: (112-151)/(55-79) 143/79 (05/26 0230) SpO2:  [90 %-99 %] 93 % (05/26 0230) Weight:  [112.5 kg] 112.5 kg (05/25 1953)  GENERAL: Awake, alert in NAD HEENT: - Normocephalic and atraumatic, dry mm, no LN++, no Thyromegally LUNGS - Clear to auscultation bilaterally with no wheezes CV - S1S2 RRR, no m/r/g, equal pulses bilaterally. ABDOMEN - Soft, nontender, nondistended with normoactive BS Ext: warm, well perfused, intact peripheral pulses, no edema  NEURO:  Mental Status: AA&Ox3  Language: speech is nondysarthric.  Naming, repetition, fluency, and comprehension intact. Cranial Nerves: PERRL EOMI, visual fields full, no facial asymmetry, facial sensation intact, hearing intact, tongue/uvula/soft palate midline, normal sternocleidomastoid and trapezius muscle strength. No evidence of tongue atrophy or fibrillations Motor: 5/5 without drift in all fours Tone: is normal and bulk is normal Sensation- Intact to light touch bilaterally Coordination: FTN intact bilaterally, no ataxia in BLE. Gait- deferred  NIHSS  0   Labs I have reviewed labs in epic and the results pertinent to this consultation are: CBC    Component Value Date/Time   WBC 7.6 06/28/2022 2003   RBC 3.75 (L) 06/28/2022 2003   HGB 11.2 (L) 06/28/2022 2003   HGB 11.1 (L) 04/21/2022 1252   HGB 11.4 01/30/2021 1123   HCT 34.7 (L) 06/28/2022 2003   HCT 35.4 01/30/2021 1123   PLT 246 06/28/2022 2003   PLT 263 04/21/2022 1252   PLT 278 01/30/2021 1123   MCV 92.5 06/28/2022 2003   MCV 94 01/30/2021 1123   MCH 29.9 06/28/2022 2003   MCHC 32.3  06/28/2022 2003   RDW 14.7 06/28/2022 2003   RDW 13.4 01/30/2021 1123   LYMPHSABS 1.9 04/21/2022 1252   LYMPHSABS 3.0 02/23/2020 1335   MONOABS 0.7 04/21/2022 1252   EOSABS 0.1 04/21/2022 1252   EOSABS 0.1 02/23/2020 1335   BASOSABS 0.1 04/21/2022 1252   BASOSABS 0.1 02/23/2020 1335    CMP     Component Value Date/Time   NA 135 06/28/2022 2003   NA 143 01/30/2021 1123   K 3.4 (L) 06/28/2022 2003   CL 97 (L) 06/28/2022 2003   CO2 26 06/28/2022 2003   GLUCOSE 102 (H) 06/28/2022 2003  BUN 26 (H) 06/28/2022 2003   BUN 23 01/30/2021 1123   CREATININE 1.19 (H) 06/28/2022 2003   CREATININE 0.95 04/21/2022 1252   CALCIUM 9.3 06/28/2022 2003   PROT 7.1 04/21/2022 1252   PROT 6.8 02/23/2020 1335   ALBUMIN 3.8 04/21/2022 1252   ALBUMIN 4.5 02/23/2020 1335   AST 27 04/21/2022 1252   ALT 33 04/21/2022 1252   ALKPHOS 123 04/21/2022 1252   BILITOT 0.4 04/21/2022 1252   GFRNONAA 48 (L) 06/28/2022 2003   GFRNONAA >60 04/21/2022 1252   GFRAA 46 (L) 02/23/2020 1335   Imaging I have reviewed the images obtained:  CT-head-no acute changes CT angio head and neck: No emergent LVO.  Known parathyroid adenoma.  MRI examination of the brain: No acute finding.  No stroke  Assessment:  72 year old with multiple cerebrovascular and cardiovascular risk factors presenting to the emergency room at an outside hospital for concerns for chest pain, chest tightness as well as shortness of breath along with dizziness which she describes as more of lightheadedness with worsening on standing. I would suspect that there might be an underlying cardiac cause for this and she might need more workup in the form of an echocardiogram. Her MRI was done with the suspicion for a stroke/TIA which was negative for acute stroke. History, examination and imaging does not look likely for TIA. I would not list TIA as a diagnosis for her at this time. Dizziness could be peripheral and may need vestibular  rehab. Subjective feeling of the right facial weakness might be recrudescence of symptoms from the prior Bell's palsy in the setting of acute illness.  On my exam, she has symmetric facies.  Impression: Chest pain-management per primary team Dizziness-question peripheral vertigo History and exam not consistent with a TIA  Recommendations: Agree with echocardiogram. Continue telemetry Continue frequent neurochecks Her LDL is 62.  She is on statin at home.  Continue home statin She is not on antiplatelet at home.  I would initiate an aspirin 81 now and daily. PT OT speech therapy-with specific focus on vestibular function assessment. No need for permissive hypertension I do not think this is a stroke/TIA Follow-up with outpatient neurology if dizziness persists.  -- Milon Dikes, MD Neurologist [facility] Pager: [phone]

## 2022-06-29 NOTE — Assessment & Plan Note (Signed)
-   Holding losartan and Dyazide for permissive hypertension

## 2022-06-29 NOTE — Progress Notes (Signed)
Stroke workup complete Long discussion with patient/daughter at bedside She has had worsening exertional dyspnea for the past several months-she plans to seek a second opinion with a pulmonologist outside of [place]-she mentioned that she may go to [facility]. I offered her to get a second opinion while she was in the hospital, also thought about getting a CT chest to further evaluate her lung parenchyma.  However she does not want to proceed with any further evaluation here, since her stroke workup is complete-she is requesting that we discharge her home today.   I have advised her that-after reviewing outpatient PCCM notes-that she should probably get back on Stiolto inhaler that seem to somewhat help her exertional dyspnea.  She tells me that it caused her blood pressure to increase-I tried to reassure her that we could adjust her blood pressure medications and control her blood pressure that way.

## 2022-06-29 NOTE — Assessment & Plan Note (Signed)
-   Reports facial droop, vertigo - CTA head and neck shows no emergent large vessel occlusion or intracranial acute changes - Possible parathyroid adenoma-this is already known by patient and is not a new finding - Consult neuro - MRI when she arrives to [facility] - Echo in the a.m. - Hopefully able to discharge the afternoon of the 26th

## 2022-06-29 NOTE — Assessment & Plan Note (Signed)
Continue Pepcid  

## 2022-06-29 NOTE — Discharge Summary (Addendum)
PATIENT DETAILS Name: Victoria Dean Age: 72 y.o. Sex: female Date of Birth: July 18, 1950 MRN: [id number]. Admitting Physician: Victoria Gilford, DO WUJ:[id number], Victoria Darby, FNP  Admit Date: 06/28/2022 Discharge date: 06/29/2022  Recommendations for Outpatient Follow-up:  Follow up with PCP in 1-2 weeks Please obtain CMP/CBC in one week Please follow A1c  Admitted From:  Home  Disposition: Home   Discharge Condition: good  CODE STATUS:   Code Status: DNR   Diet recommendation:  Diet Order             Diet Heart Room service appropriate? Yes; Fluid consistency: Thin  Diet effective now           Diet - low sodium heart healthy                    Brief Summary: Patient is a 72 y.o.  female with history of breast cancer now on letrozole, COPD, HLD, depression, GERD who presented to [facility] ED with worsening chest pain (has chronic/recurrent chest pain from GERD)-she also complaining of right facial weakness that lasted for approximately 15-20 minutes.  She was then transferred to [facility] for TIA/stroke workup.   Significant events: 5/25>> admit to [facility]-initially presented to [facility] ED.   Significant studies: 5/25>> CXR: No active disease. 5/25>> D-dimer:<0.27 5/25>> CT angio head/neck: No LVO/high-grade stenosis. 5/26>> MRI brain: No acute CVA. 5/26>> LDL: 62 5/26>> A1c: Pending 5-26>>Echo: EF 60-65%, no regional wall motion abnormalities.  Significant microbiology data: 5/25>> COVID PCR: Negative   Procedures: None   Consults: Neurology  Brief Hospital Course: TIA Right facial weakness has resolved Workup as above Discussed with neurologist-Dr. Arora-recommendations are to continue aspirin and statin on discharge.  No further recommendations.   Exertional dyspnea This is a chronic issue for the patient apparently has been going on for several months Her lungs are completely clear on exam-not suggestive of COPD flare She has no signs of volume overload on  exam suggestive of CHF Her D-dimer was negative-unlikely to be VTE given long duration of symptoms Echocardiogram shows stable EF. She apparently follows closely with pulmonology-but relates to me this morning that she is unhappy with the pulmonologist and would want to change providers-and plans to seek a second opinion outside of -probably with a pulmonologist at [facility]. She unfortunately has had severe allergic reaction to almost every inhaler regimen-and only uses albuterol for rescue.  Reviewed outpatient pulmonary notes-she apparently had good response to Stiolto-but stopped it due to increased blood pressure, I tried to reassure the patient that we potentially could adjust her blood pressure medications and control of blood pressure that way.   Long d/w with patient/daughter at bedside-see prior notes-offered to get her a inpatient second opinion from [facility] further workup including a CT scan of her chest to evaluate lung parenchyma.  However patient requested that I discharge her home-as she feels she is better and at her baseline.   Atypical chest pain Has resolved Per history has longstanding chest pain secondary to GERD-per patient-chest pain on presentation yesterday was similar to GERD but a bit more severe. Troponins negative, D-dimer not elevated. Continue Pepcid.   HLD Statin   HTN Resume losartan/Maxide   GERD As above   Anxiety disorder Continue as needed Valium   Morbid Obesity: Estimated body mass index is 42.57 kg/m as calculated from the following:   Height as of this encounter: 5\' 4"  (1.626 m).   Weight as of  this encounter: 112.5 kg.    Discharge Diagnoses:  Principal Problem:   TIA (transient ischemic attack) Active Problems:   Essential (primary) hypertension   Anxiety, generalized   Hypokalemia   GERD (gastroesophageal reflux disease)   Hyperlipidemia   Discharge Instructions:  Activity:  As  tolerated Discharge Instructions     Diet - low sodium heart healthy   Complete by: As directed    Discharge instructions   Complete by: As directed    Follow with Primary MD  Victoria Grout, FNP in 1-2 weeks  Please get a complete blood count and chemistry panel checked by your Primary MD at your next visit, and again as instructed by your Primary MD.  Get Medicines reviewed and adjusted: Please take all your medications with you for your next visit with your Primary MD  Laboratory/radiological data: Please request your Primary MD to go over all hospital tests and procedure/radiological results at the follow up, please ask your Primary MD to get all Hospital records sent to his/her office.  In some cases, they will be blood work, cultures and biopsy results pending at the time of your discharge. Please request that your primary care M.D. follows up on these results.  Also Note the following: If you experience worsening of your admission symptoms, develop shortness of breath, life threatening emergency, suicidal or homicidal thoughts you must seek medical attention immediately by calling 911 or calling your MD immediately  if symptoms less severe.  You must read complete instructions/literature along with all the possible adverse reactions/side effects for all the Medicines you take and that have been prescribed to you. Take any new Medicines after you have completely understood and accpet all the possible adverse reactions/side effects.   Do not drive when taking Pain medications or sleeping medications (Benzodaizepines)  Do not take more than prescribed Pain, Sleep and Anxiety Medications. It is not advisable to combine anxiety,sleep and pain medications without talking with your primary care practitioner  Special Instructions: If you have smoked or chewed Tobacco  in the last 2 yrs please stop smoking, stop any regular Alcohol  and or any Recreational drug use.  Wear Seat belts while  driving.  Please note: You were cared for by a hospitalist during your hospital stay. Once you are discharged, your primary care physician will handle any further medical issues. Please note that NO REFILLS for any discharge medications will be authorized once you are discharged, as it is imperative that you return to your primary care physician (or establish a relationship with a primary care physician if you do not have one) for your post hospital discharge needs so that they can reassess your need for medications and monitor your lab values.   Increase activity slowly   Complete by: As directed       Allergies as of 06/29/2022       Reactions   Other Itching   Brand name bandaids with cloth backing causes redness and itching   Statins    Muscle weakness   Zetia [ezetimibe]    Leg pain, upset stomach   Sulfa Antibiotics Rash        Medication List     TAKE these medications    acetaminophen 650 MG CR tablet Commonly known as: TYLENOL Take 1,300 mg by mouth every 8 (eight) hours as needed for pain.   albuterol 108 (90 Base) MCG/ACT inhaler Commonly known as: VENTOLIN HFA Inhale 2 puffs into the lungs every 6 (six) hours as  needed for wheezing or shortness of breath.   aspirin EC 81 MG tablet Take 1 tablet (81 mg total) by mouth daily. Swallow whole.   BIOFREEZE EX Apply 1 application topically daily as needed (back pain).   Biotin 1000 MCG tablet Take 1,000 mcg by mouth daily.   CALCIUM 1200 PO Take 1 tablet by mouth 2 (two) times daily.   cholecalciferol 25 MCG (1000 UNIT) tablet Commonly known as: VITAMIN D3 Take 1,000 Units by mouth daily.   cyanocobalamin 1000 MCG/ML injection Commonly known as: VITAMIN B12 Inject 1 mL (1,000 mcg total) into the muscle every 30 (thirty) days.   diazepam 5 MG tablet Commonly known as: VALIUM Take 1 tablet (5 mg total) by mouth every 12 (twelve) hours as needed for anxiety.   famotidine 10 MG tablet Commonly known as:  PEPCID Take by mouth. Take 10 mg by mouth 2 (two) times daily as needed for Heartburn   hydrocortisone 2.5 % lotion Apply 1 application topically daily as needed (when washing face).   letrozole 2.5 MG tablet Commonly known as: FEMARA TAKE 1 TABLET EVERY DAY   loratadine 10 MG tablet Commonly known as: CLARITIN Take by mouth. Take 10 mg by mouth once daily   losartan 100 MG tablet Commonly known as: COZAAR TAKE 1 TABLET EVERY DAY   mometasone 0.1 % cream Commonly known as: ELOCON Apply 1 application topically daily as needed (ear irritation).   rosuvastatin 5 MG tablet Commonly known as: CRESTOR Take 1 tablet (5 mg total) by mouth daily.   SYRINGE 3CC/25GX1" 25G X 1" 3 ML Misc 1 Syringe by Does not apply route every 30 (thirty) days.   triamterene-hydrochlorothiazide 37.5-25 MG capsule Commonly known as: DYAZIDE TAKE 1 CAPSULE EVERY DAY   venlafaxine XR 75 MG 24 hr capsule Commonly known as: EFFEXOR-XR TAKE 3 CAPSULES (225 MG TOTAL) BY MOUTH DAILY WITH BREAKFAST. What changed:  how much to take additional instructions   zoledronic acid 4 MG/5ML injection Commonly known as: ZOMETA Inject 4 mg into the vein once.        Follow-up Information     Victoria Grout, FNP. Schedule an appointment as soon as possible for a visit in 1 week(s).   Specialty: Family Medicine Contact information: [address] [place] [address] [phone]                Allergies  Allergen Reactions   Other Itching    Brand name bandaids with cloth backing causes redness and itching   Statins     Muscle weakness   Zetia [Ezetimibe]     Leg pain, upset stomach   Sulfa Antibiotics Rash     Other Procedures/Studies: ECHOCARDIOGRAM COMPLETE  Result Date: 06/29/2022    ECHOCARDIOGRAM REPORT   Patient Name:   Victoria Dean Date of Exam: 06/29/2022 Medical Rec #:  [id number]    Height:       64.0 in Accession #:    [id number]   Weight:       248.0 lb Date of Birth:   [birth year]   BSA:          2.144 m Patient Age:    71 years     BP:           142/77 mmHg Patient Gender: F            HR:           97 bpm. Exam Location:  Inpatient Procedure: 2D Echo, Color  Doppler and Cardiac Doppler Indications:    TIA  History:        Patient has prior history of Echocardiogram examinations, most                 recent 10/16/2020. Arrythmias:PVC; Risk Factors:Hypertension.  Sonographer:    Darlys Gales Referring Phys: [phone] [facility]  Sonographer Comments: No parasternal window and Technically difficult study due to poor echo windows. IMPRESSIONS  1. Poor acoustic windows limits utility of study.  2. Left ventricular ejection fraction, by estimation, is 60 to 65%. The left ventricle has normal function. The left ventricle has no regional wall motion abnormalities. Left ventricular diastolic parameters are indeterminate.  3. Right ventricular systolic function was not well visualized. The right ventricular size is normal.  4. The mitral valve was not well visualized. No evidence of mitral valve regurgitation. No evidence of mitral stenosis.  5. Appears may be sclerotic with small gradient but no significant AS. The aortic valve was not well visualized. Aortic valve regurgitation is not visualized. No aortic stenosis is present.  6. The inferior vena cava is normal in size with greater than 50% respiratory variability, suggesting right atrial pressure of 3 mmHg. FINDINGS  Left Ventricle: Left ventricular ejection fraction, by estimation, is 60 to 65%. The left ventricle has normal function. The left ventricle has no regional wall motion abnormalities. Definity contrast agent was given IV to delineate the left ventricular  endocardial borders. The left ventricular internal cavity size was normal in size. There is no left ventricular hypertrophy. Left ventricular diastolic parameters are indeterminate. Right Ventricle: The right ventricular size is normal. No increase in right ventricular  wall thickness. Right ventricular systolic function was not well visualized. Left Atrium: Left atrial size was not well visualized. Right Atrium: Right atrial size was not well visualized. Pericardium: There is no evidence of pericardial effusion. Mitral Valve: The mitral valve was not well visualized. No evidence of mitral valve regurgitation. No evidence of mitral valve stenosis. Tricuspid Valve: The tricuspid valve is not well visualized. Tricuspid valve regurgitation is not demonstrated. No evidence of tricuspid stenosis. Aortic Valve: Appears may be sclerotic with small gradient but no significant AS. The aortic valve was not well visualized. Aortic valve regurgitation is not visualized. No aortic stenosis is present. Aortic valve peak gradient measures 12.2 mmHg. Pulmonic Valve: The pulmonic valve was not well visualized. Pulmonic valve regurgitation is not visualized. No evidence of pulmonic stenosis. Aorta: The aortic root is normal in size and structure and the aortic root was not well visualized. Venous: The inferior vena cava is normal in size with greater than 50% respiratory variability, suggesting right atrial pressure of 3 mmHg. IAS/Shunts: No atrial level shunt detected by color flow Doppler. Additional Comments: Poor acoustic windows limits utility of study.   Diastology LV e' medial:    4.03 cm/s LV E/e' medial:  31.3 LV e' lateral:   7.40 cm/s LV E/e' lateral: 17.0  RIGHT VENTRICLE RV S prime:     12.50 cm/s TAPSE (M-mode): 1.9 cm LEFT ATRIUM           Index LA Vol (A4C): 50.5 ml 23.56 ml/m  AORTIC VALVE AV Vmax:      175.00 cm/s AV Peak Grad: 12.2 mmHg LVOT Vmax:    127.00 cm/s LVOT Vmean:   93.400 cm/s LVOT VTI:     0.311 m MITRAL VALVE MV Area (PHT): 2.47 cm     SHUNTS MV E velocity: 126.00 cm/s  Systemic VTI: 0.31 m MV A velocity: 117.00 cm/s MV E/A ratio:  1.08 Charlton Haws MD Electronically signed by Charlton Haws MD Signature Date/Time: 06/29/2022/1:02:34 PM    Final    MR BRAIN WO  CONTRAST  Result Date: 06/29/2022 CLINICAL DATA:  TIA EXAM: MRI HEAD WITHOUT CONTRAST TECHNIQUE: Multiplanar, multiecho pulse sequences of the brain and surrounding structures were obtained without intravenous contrast. COMPARISON:  Head CT and CTA from yesterday FINDINGS: Brain: No acute infarction, hemorrhage, hydrocephalus, extra-axial collection or mass lesion. Mild chronic small vessel ischemia in the cerebral white matter, less than commonly seen for patient age. Normal brain volume Vascular: Major flow voids are preserved. Skull and upper cervical spine: Normal marrow signal. Sinuses/Orbits: Bilateral cataract resection IMPRESSION: No acute finding including infarct.  Unremarkable study for age. Electronically Signed   By: Tiburcio Pea M.D.   On: 06/29/2022 07:32   CT ANGIO HEAD NECK W WO CM  Result Date: 06/28/2022 CLINICAL DATA:  Central vertigo EXAM: CT ANGIOGRAPHY HEAD AND NECK WITH AND WITHOUT CONTRAST TECHNIQUE: Multidetector CT imaging of the head and neck was performed using the standard protocol during bolus administration of intravenous contrast. Multiplanar CT image reconstructions and MIPs were obtained to evaluate the vascular anatomy. Carotid stenosis measurements (when applicable) are obtained utilizing NASCET criteria, using the distal internal carotid diameter as the denominator. RADIATION DOSE REDUCTION: This exam was performed according to the departmental dose-optimization program which includes automated exposure control, adjustment of the mA and/or kV according to patient size and/or use of iterative reconstruction technique. CONTRAST:  75mL OMNIPAQUE IOHEXOL 350 MG/ML SOLN COMPARISON:  None Available. FINDINGS: CT HEAD FINDINGS Brain: There is no mass, hemorrhage or extra-axial collection. The size and configuration of the ventricles and extra-axial CSF spaces are normal. There is no acute or chronic infarction. The brain parenchyma is normal. Skull: The visualized skull base,  calvarium and extracranial soft tissues are normal. Sinuses/Orbits: No fluid levels or advanced mucosal thickening of the visualized paranasal sinuses. No mastoid or middle ear effusion. The orbits are normal. CTA NECK FINDINGS SKELETON: There is no bony spinal canal stenosis. No lytic or blastic lesion. OTHER NECK: Enhancing focus superficial to the midline thyroid cartilage. 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 UPPER CHEST: No pneumothorax or pleural effusion. No nodules or masses. AORTIC ARCH: There is no calcific atherosclerosis of the aortic arch. There is no aneurysm, dissection or hemodynamically significant stenosis of the visualized portion of the aorta. Conventional 3 vessel aortic branching pattern. The visualized proximal subclavian arteries are widely patent. RIGHT CAROTID SYSTEM: Normal without aneurysm, dissection or stenosis. LEFT CAROTID SYSTEM: Normal without aneurysm, dissection or stenosis. VERTEBRAL ARTERIES: Left dominant configuration. Both origins are clearly patent. There is no dissection, occlusion or flow-limiting stenosis to the skull base (V1-V3 segments). CTA HEAD FINDINGS POSTERIOR CIRCULATION: --Vertebral arteries: Normal V4 segments. --Inferior cerebellar arteries: Normal. --Basilar artery: Normal. --Superior cerebellar arteries: Normal. --Posterior cerebral arteries (PCA): Normal. ANTERIOR CIRCULATION: --Intracranial internal carotid arteries: Normal. --Anterior cerebral arteries (ACA): Normal. Both A1 segments are present. Patent anterior communicating artery (a-comm). --Middle cerebral arteries (MCA): Normal. VENOUS SINUSES: As permitted by contrast timing, patent. ANATOMIC VARIANTS: None Review of the MIP images confirms the above findings. IMPRESSION: 1. No emergent large vessel occlusion or high-grade stenosis of the intracranial arteries. 2. 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 Electronically Signed   By: Deatra Robinson M.D.   On:  06/28/2022 23:25   DG Chest Portable 1 View  Result Date: 06/28/2022  CLINICAL DATA:  CP and SOB EXAM: PORTABLE CHEST 1 VIEW COMPARISON:  Chest x-ray 01/19/2021 FINDINGS: The heart and mediastinal contours are unchanged. No focal consolidation. No pulmonary edema. No pleural effusion. No pneumothorax. No acute osseous abnormality. IMPRESSION: No active disease. Electronically Signed   By: Tish Frederickson M.D.   On: 06/28/2022 20:27   MM DIAG BREAST TOMO BILATERAL  Result Date: 06/06/2022 CLINICAL DATA:  Status post LEFT lumpectomy at the end of May 2022 for invasive lobular carcinoma with close margins and multiple positive LEFT axillary lymph nodes. EXAM: DIGITAL DIAGNOSTIC BILATERAL MAMMOGRAM WITH TOMOSYNTHESIS; ULTRASOUND LEFT BREAST LIMITED TECHNIQUE: Bilateral digital diagnostic mammography and breast tomosynthesis was performed.; Targeted ultrasound examination of the left breast was performed. Best images possible per technologist communication. COMPARISON:  Previous exam(s). ACR Breast Density Category b: There are scattered areas of fibroglandular density. FINDINGS: There is density and architectural distortion within the LEFT breast, consistent with postsurgical changes. These are stable in comparison to prior. No suspicious mass, distortion, or microcalcifications are identified to suggest presence of malignancy. Targeted ultrasound was performed of the LEFT axilla. No suspicious axillary lymph nodes are visualized. Targeted ultrasound was performed of the LEFT lumpectomy bed. No suspicious cystic or solid mass is seen. Postsurgical changes are noted. IMPRESSION: 1. No mammographic evidence of malignancy bilaterally. 2. No new suspicious LEFT axillary adenopathy. RECOMMENDATION: Recommend bilateral diagnostic mammogram (with RIGHT and LEFT breast ultrasound if deemed necessary) in 1 year. This will establish over 2 years of definitive stability status post lumpectomy. I have discussed the findings  and recommendations with the patient. If applicable, a reminder letter will be sent to the patient regarding the next appointment. BI-RADS CATEGORY  2: Benign. Electronically Signed   By: Meda Klinefelter M.D.   On: 06/06/2022 11:44  [place] LIMITED ULTRASOUND INCLUDING AXILLA LEFT BREAST   Result Date: 06/06/2022 CLINICAL DATA:  Status post LEFT lumpectomy at the end of May 2022 for invasive lobular carcinoma with close margins and multiple positive LEFT axillary lymph nodes. EXAM: DIGITAL DIAGNOSTIC BILATERAL MAMMOGRAM WITH TOMOSYNTHESIS; ULTRASOUND LEFT BREAST LIMITED TECHNIQUE: Bilateral digital diagnostic mammography and breast tomosynthesis was performed.; Targeted ultrasound examination of the left breast was performed. Best images possible per technologist communication. COMPARISON:  Previous exam(s). ACR Breast Density Category b: There are scattered areas of fibroglandular density. FINDINGS: There is density and architectural distortion within the LEFT breast, consistent with postsurgical changes. These are stable in comparison to prior. No suspicious mass, distortion, or microcalcifications are identified to suggest presence of malignancy. Targeted ultrasound was performed of the LEFT axilla. No suspicious axillary lymph nodes are visualized. Targeted ultrasound was performed of the LEFT lumpectomy bed. No suspicious cystic or solid mass is seen. Postsurgical changes are noted. IMPRESSION: 1. No mammographic evidence of malignancy bilaterally. 2. No new suspicious LEFT axillary adenopathy. RECOMMENDATION: Recommend bilateral diagnostic mammogram (with RIGHT and LEFT breast ultrasound if deemed necessary) in 1 year. This will establish over 2 years of definitive stability status post lumpectomy. I have discussed the findings and recommendations with the patient. If applicable, a reminder letter will be sent to the patient regarding the next appointment. BI-RADS CATEGORY  2: Benign. Electronically Signed    By: Meda Klinefelter M.D.   On: 06/06/2022 11:44    TODAY-DAY OF DISCHARGE:  Subjective:   Victoria Dean today has no headache,no chest abdominal pain,no new weakness tingling or numbness, feels much better wants to go home today.   Objective:   Blood pressure (!) 142/77,  pulse 86, temperature 98.2 F (36.8 C), temperature source Oral, resp. rate 18, height 5\' 4"  (1.626 m), weight 112.5 kg, SpO2 93 %.  Intake/Output Summary (Last 24 hours) at 06/29/2022 1421 Last data filed at 06/29/2022 0500 Gross per 24 hour  Intake 526.98 ml  Output --  Net 526.98 ml   Filed Weights   06/28/22 1953  Weight: 112.5 kg    Exam: Awake Alert, Oriented *3, No new F.N deficits, Normal affect .AT,PERRAL Supple Neck,No JVD, No cervical lymphadenopathy appriciated.  Symmetrical Chest wall movement, Good air movement bilaterally, CTAB RRR,No Gallops,Rubs or new Murmurs, No Parasternal Heave +ve B.Sounds, Abd Soft, Non tender, No organomegaly appriciated, No rebound -guarding or rigidity. No Cyanosis, Clubbing or edema, No new Rash or bruise   PERTINENT RADIOLOGIC STUDIES: ECHOCARDIOGRAM COMPLETE  Result Date: 06/29/2022    ECHOCARDIOGRAM REPORT   Patient Name:   Victoria Dean Date of Exam: 06/29/2022 Medical Rec #:  [id number]    Height:       64.0 in Accession #:    [id number]   Weight:       248.0 lb Date of Birth:  1950/02/19   BSA:          2.144 m Patient Age:    71 years     BP:           142/77 mmHg Patient Gender: F            HR:           97 bpm. Exam Location:  Inpatient Procedure: 2D Echo, Color Doppler and Cardiac Doppler Indications:    TIA  History:        Patient has prior history of Echocardiogram examinations, most                 recent 10/16/2020. Arrythmias:PVC; Risk Factors:Hypertension.  Sonographer:    Darlys Gales Referring Phys: [phone] [facility]  Sonographer Comments: No parasternal window and Technically difficult study due to poor echo windows. IMPRESSIONS  1. Poor  acoustic windows limits utility of study.  2. Left ventricular ejection fraction, by estimation, is 60 to 65%. The left ventricle has normal function. The left ventricle has no regional wall motion abnormalities. Left ventricular diastolic parameters are indeterminate.  3. Right ventricular systolic function was not well visualized. The right ventricular size is normal.  4. The mitral valve was not well visualized. No evidence of mitral valve regurgitation. No evidence of mitral stenosis.  5. Appears may be sclerotic with small gradient but no significant AS. The aortic valve was not well visualized. Aortic valve regurgitation is not visualized. No aortic stenosis is present.  6. The inferior vena cava is normal in size with greater than 50% respiratory variability, suggesting right atrial pressure of 3 mmHg. FINDINGS  Left Ventricle: Left ventricular ejection fraction, by estimation, is 60 to 65%. The left ventricle has normal function. The left ventricle has no regional wall motion abnormalities. Definity contrast agent was given IV to delineate the left ventricular  endocardial borders. The left ventricular internal cavity size was normal in size. There is no left ventricular hypertrophy. Left ventricular diastolic parameters are indeterminate. Right Ventricle: The right ventricular size is normal. No increase in right ventricular wall thickness. Right ventricular systolic function was not well visualized. Left Atrium: Left atrial size was not well visualized. Right Atrium: Right atrial size was not well visualized. Pericardium: There is no evidence of pericardial effusion. Mitral Valve: The mitral valve was  not well visualized. No evidence of mitral valve regurgitation. No evidence of mitral valve stenosis. Tricuspid Valve: The tricuspid valve is not well visualized. Tricuspid valve regurgitation is not demonstrated. No evidence of tricuspid stenosis. Aortic Valve: Appears may be sclerotic with small gradient  but no significant AS. The aortic valve was not well visualized. Aortic valve regurgitation is not visualized. No aortic stenosis is present. Aortic valve peak gradient measures 12.2 mmHg. Pulmonic Valve: The pulmonic valve was not well visualized. Pulmonic valve regurgitation is not visualized. No evidence of pulmonic stenosis. Aorta: The aortic root is normal in size and structure and the aortic root was not well visualized. Venous: The inferior vena cava is normal in size with greater than 50% respiratory variability, suggesting right atrial pressure of 3 mmHg. IAS/Shunts: No atrial level shunt detected by color flow Doppler. Additional Comments: Poor acoustic windows limits utility of study.   Diastology LV e' medial:    4.03 cm/s LV E/e' medial:  31.3 LV e' lateral:   7.40 cm/s LV E/e' lateral: 17.0  RIGHT VENTRICLE RV S prime:     12.50 cm/s TAPSE (M-mode): 1.9 cm LEFT ATRIUM           Index LA Vol (A4C): 50.5 ml 23.56 ml/m  AORTIC VALVE AV Vmax:      175.00 cm/s AV Peak Grad: 12.2 mmHg LVOT Vmax:    127.00 cm/s LVOT Vmean:   93.400 cm/s LVOT VTI:     0.311 m MITRAL VALVE MV Area (PHT): 2.47 cm     SHUNTS MV E velocity: 126.00 cm/s  Systemic VTI: 0.31 m MV A velocity: 117.00 cm/s MV E/A ratio:  1.08 Charlton Haws MD Electronically signed by Charlton Haws MD Signature Date/Time: 06/29/2022/1:02:34 PM    Final    MR BRAIN WO CONTRAST  Result Date: 06/29/2022 CLINICAL DATA:  TIA EXAM: MRI HEAD WITHOUT CONTRAST TECHNIQUE: Multiplanar, multiecho pulse sequences of the brain and surrounding structures were obtained without intravenous contrast. COMPARISON:  Head CT and CTA from yesterday FINDINGS: Brain: No acute infarction, hemorrhage, hydrocephalus, extra-axial collection or mass lesion. Mild chronic small vessel ischemia in the cerebral white matter, less than commonly seen for patient age. Normal brain volume Vascular: Major flow voids are preserved. Skull and upper cervical spine: Normal marrow signal.  Sinuses/Orbits: Bilateral cataract resection IMPRESSION: No acute finding including infarct.  Unremarkable study for age. Electronically Signed   By: Tiburcio Pea M.D.   On: 06/29/2022 07:32   CT ANGIO HEAD NECK W WO CM  Result Date: 06/28/2022 CLINICAL DATA:  Central vertigo EXAM: CT ANGIOGRAPHY HEAD AND NECK WITH AND WITHOUT CONTRAST TECHNIQUE: Multidetector CT imaging of the head and neck was performed using the standard protocol during bolus administration of intravenous contrast. Multiplanar CT image reconstructions and MIPs were obtained to evaluate the vascular anatomy. Carotid stenosis measurements (when applicable) are obtained utilizing NASCET criteria, using the distal internal carotid diameter as the denominator. RADIATION DOSE REDUCTION: This exam was performed according to the departmental dose-optimization program which includes automated exposure control, adjustment of the mA and/or kV according to patient size and/or use of iterative reconstruction technique. CONTRAST:  75mL OMNIPAQUE IOHEXOL 350 MG/ML SOLN COMPARISON:  None Available. FINDINGS: CT HEAD FINDINGS Brain: There is no mass, hemorrhage or extra-axial collection. The size and configuration of the ventricles and extra-axial CSF spaces are normal. There is no acute or chronic infarction. The brain parenchyma is normal. Skull: The visualized skull base, calvarium and extracranial soft tissues are normal.  Sinuses/Orbits: No fluid levels or advanced mucosal thickening of the visualized paranasal sinuses. No mastoid or middle ear effusion. The orbits are normal. CTA NECK FINDINGS SKELETON: There is no bony spinal canal stenosis. No lytic or blastic lesion. OTHER NECK: Enhancing focus superficial to the midline thyroid cartilage. 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 UPPER CHEST: No pneumothorax or pleural effusion. No nodules or masses. AORTIC ARCH: There is no calcific atherosclerosis of the aortic arch. There is no aneurysm, dissection or  hemodynamically significant stenosis of the visualized portion of the aorta. Conventional 3 vessel aortic branching pattern. The visualized proximal subclavian arteries are widely patent. RIGHT CAROTID SYSTEM: Normal without aneurysm, dissection or stenosis. LEFT CAROTID SYSTEM: Normal without aneurysm, dissection or stenosis. VERTEBRAL ARTERIES: Left dominant configuration. Both origins are clearly patent. There is no dissection, occlusion or flow-limiting stenosis to the skull base (V1-V3 segments). CTA HEAD FINDINGS POSTERIOR CIRCULATION: --Vertebral arteries: Normal V4 segments. --Inferior cerebellar arteries: Normal. --Basilar artery: Normal. --Superior cerebellar arteries: Normal. --Posterior cerebral arteries (PCA): Normal. ANTERIOR CIRCULATION: --Intracranial internal carotid arteries: Normal. --Anterior cerebral arteries (ACA): Normal. Both A1 segments are present. Patent anterior communicating artery (a-comm). --Middle cerebral arteries (MCA): Normal. VENOUS SINUSES: As permitted by contrast timing, patent. ANATOMIC VARIANTS: None Review of the MIP images confirms the above findings. IMPRESSION: 1. No emergent large vessel occlusion or high-grade stenosis of the intracranial arteries. 2. 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 Electronically Signed   By: Deatra Robinson M.D.   On: 06/28/2022 23:25   DG Chest Portable 1 View  Result Date: 06/28/2022 CLINICAL DATA:  CP and SOB EXAM: PORTABLE CHEST 1 VIEW COMPARISON:  Chest x-ray 01/19/2021 FINDINGS: The heart and mediastinal contours are unchanged. No focal consolidation. No pulmonary edema. No pleural effusion. No pneumothorax. No acute osseous abnormality. IMPRESSION: No active disease. Electronically Signed   By: Tish Frederickson M.D.   On: 06/28/2022 20:27     PERTINENT LAB RESULTS: CBC: Recent Labs    06/28/22 2003  WBC 7.6  HGB 11.2*  HCT 34.7*  PLT 246   CMET CMP     Component Value  Date/Time   NA 135 06/28/2022 2003   NA 143 01/30/2021 1123   K 3.4 (L) 06/28/2022 2003   CL 97 (L) 06/28/2022 2003   CO2 26 06/28/2022 2003   GLUCOSE 102 (H) 06/28/2022 2003   BUN 26 (H) 06/28/2022 2003   BUN 23 01/30/2021 1123   CREATININE 1.19 (H) 06/28/2022 2003   CREATININE 0.95 04/21/2022 1252   CALCIUM 9.3 06/28/2022 2003   PROT 7.1 04/21/2022 1252   PROT 6.8 02/23/2020 1335   ALBUMIN 3.8 04/21/2022 1252   ALBUMIN 4.5 02/23/2020 1335   AST 27 04/21/2022 1252   ALT 33 04/21/2022 1252   ALKPHOS 123 04/21/2022 1252   BILITOT 0.4 04/21/2022 1252   GFRNONAA 48 (L) 06/28/2022 2003   GFRNONAA >60 04/21/2022 1252   GFRAA 46 (L) 02/23/2020 1335    GFR Estimated Creatinine Clearance: 51.7 mL/min (A) (by C-G formula based on SCr of 1.19 mg/dL (H)). No results for input(s): "LIPASE", "AMYLASE" in the last 72 hours. No results for input(s): "CKTOTAL", "CKMB", "CKMBINDEX", "TROPONINI" in the last 72 hours. Invalid input(s): "POCBNP" Recent Labs    06/28/22 2112  DDIMER <0.27   No results for input(s): "HGBA1C" in the last 72 hours. Recent Labs    06/29/22 0430  CHOL 124  HDL 47  LDLCALC 62  TRIG 76  CHOLHDL 2.6   No  results for input(s): "TSH", "T4TOTAL", "T3FREE", "THYROIDAB" in the last 72 hours.  Invalid input(s): "FREET3" No results for input(s): "VITAMINB12", "FOLATE", "FERRITIN", "TIBC", "IRON", "RETICCTPCT" in the last 72 hours. Coags: No results for input(s): "INR" in the last 72 hours.  Invalid input(s): "PT" Microbiology: Recent Results (from the past 240 hour(s))  SARS Coronavirus 2 by RT PCR (hospital order, performed in [facility] hospital lab) *cepheid single result test* Anterior Nasal Swab     Status: None   Collection Time: 06/28/22  8:37 PM   Specimen: Anterior Nasal Swab  Result Value Ref Range Status   SARS Coronavirus 2 by RT PCR NEGATIVE NEGATIVE Final    Comment: (NOTE) SARS-CoV-2 target nucleic acids are NOT DETECTED.  The SARS-CoV-2  RNA is generally detectable in upper and lower respiratory specimens during the acute phase of infection. The lowest concentration of SARS-CoV-2 viral copies this assay can detect is 250 copies / mL. A negative result does not preclude SARS-CoV-2 infection and should not be used as the sole basis for treatment or other patient management decisions.  A negative result may occur with improper specimen collection / handling, submission of specimen other than nasopharyngeal swab, presence of viral mutation(s) within the areas targeted by this assay, and inadequate number of viral copies (<250 copies / mL). A negative result must be combined with clinical observations, patient history, and epidemiological information.  Fact Sheet for Patients:   RoadLapTop.co.za  Fact Sheet for Healthcare Providers: http://kim-miller.com/  This test is not yet approved or  cleared by the [place] FDA and has been authorized for detection and/or diagnosis of SARS-CoV-2 by FDA under an Emergency Use Authorization (EUA).  This EUA will remain in effect (meaning this test can be used) for the duration of the COVID-19 declaration under Section 564(b)(1) of the Act, 21 U.S.C. section 360bbb-3(b)(1), unless the authorization is terminated or revoked sooner.  Performed at [facility], [address], [place], [place] [address]     FURTHER DISCHARGE INSTRUCTIONS:  Get Medicines reviewed and adjusted: Please take all your medications with you for your next visit with your Primary MD  Laboratory/radiological data: Please request your Primary MD to go over all hospital tests and procedure/radiological results at the follow up, please ask your Primary MD to get all Hospital records sent to his/her office.  In some cases, they will be blood work, cultures and biopsy results pending at the time of your discharge. Please request that your primary care M.D. goes  through all the records of your hospital data and follows up on these results.  Also Note the following: If you experience worsening of your admission symptoms, develop shortness of breath, life threatening emergency, suicidal or homicidal thoughts you must seek medical attention immediately by calling 911 or calling your MD immediately  if symptoms less severe.  You must read complete instructions/literature along with all the possible adverse reactions/side effects for all the Medicines you take and that have been prescribed to you. Take any new Medicines after you have completely understood and accpet all the possible adverse reactions/side effects.   Do not drive when taking Pain medications or sleeping medications (Benzodaizepines)  Do not take more than prescribed Pain, Sleep and Anxiety Medications. It is not advisable to combine anxiety,sleep and pain medications without talking with your primary care practitioner  Special Instructions: If you have smoked or chewed Tobacco  in the last 2 yrs please stop smoking, stop any regular Alcohol  and or any Recreational  drug use.  Wear Seat belts while driving.  Please note: You were cared for by a hospitalist during your hospital stay. Once you are discharged, your primary care physician will handle any further medical issues. Please note that NO REFILLS for any discharge medications will be authorized once you are discharged, as it is imperative that you return to your primary care physician (or establish a relationship with a primary care physician if you do not have one) for your post hospital discharge needs so that they can reassess your need for medications and monitor your lab values.  Total Time spent coordinating discharge including counseling, education and face to face time equals greater than 30 minutes.  Signed: Jeoffrey Massed 06/29/2022 2:21 PM

## 2022-06-29 NOTE — Assessment & Plan Note (Addendum)
-   Chronically low - 3.4 today - Continue to replace with 20 mEq of potassium - Trend in the a.m. - Monitor on telemetry

## 2022-06-29 NOTE — Assessment & Plan Note (Signed)
Continue Crestor 

## 2022-06-29 NOTE — Evaluation (Signed)
Physical Therapy Evaluation Patient Details Name: Victoria Dean MRN: [id number] DOB: [birth year] Today's Date: 06/29/2022  History of Present Illness  Patient is a 72 y/o female admitted 06/28/22 with weakness, dyspnea, light headedness and chest pain.  Patient with PMH positive for HTN, HLD, depression, prior breast cancer, and emphysema.  CTA negative for LVO, concern for TIA.  Clinical Impression  Patient presents with mobility close to functional baseline.  Though daughter reports gradual decline over 6 months since stopping therapy after cancer treatment.  Feel she is stable for home with cane and family support.  Encouraged follow up with PCP for referral to outpatient PT.  No further skilled PT needs as planned d/c home.        Recommendations for follow up therapy are one component of a multi-disciplinary discharge planning process, led by the attending physician.  Recommendations may be updated based on patient status, additional functional criteria and insurance authorization.  Follow Up Recommendations       Assistance Recommended at Discharge    Patient can return home with the following  A little help with walking and/or transfers;Assist for transportation;Assistance with cooking/housework    Equipment Recommendations None recommended by PT  Recommendations for Other Services       Functional Status Assessment Patient has not had a recent decline in their functional status     Precautions / Restrictions Precautions Precautions: Fall      Mobility  Bed Mobility Overal bed mobility: Modified Independent             General bed mobility comments: to supine crawls in bed on knees as has to use step stool at home    Transfers Overall transfer level: Modified independent Equipment used: Straight cane                    Ambulation/Gait Ambulation/Gait assistance: Supervision Gait Distance (Feet): 150 Feet Assistive device: Straight cane Gait  Pattern/deviations: Step-through pattern, Decreased stride length       General Gait Details: using cane in either hand, noted slightly tall for her height; slower pace and increased time on turns  Information systems manager Rankin (Stroke Patients Only) Modified Rankin (Stroke Patients Only) Pre-Morbid Rankin Score: No significant disability Modified Rankin: Slight disability     Balance Overall balance assessment: Mild deficits observed, not formally tested                                           Pertinent Vitals/Pain Pain Assessment Pain Assessment: No/denies pain    Home Living Family/patient expects to be discharged to:: Private residence Living Arrangements: Alone Available Help at Discharge: Family;Available PRN/intermittently Type of Home: House Home Access: Stairs to enter Entrance Stairs-Rails: None Entrance Stairs-Number of Steps: 2   Home Layout: One level Home Equipment: Cane - single point      Prior Function Prior Level of Function : Independent/Modified Independent             Mobility Comments: using cane at home, did therapy about 6 months ago after cancer treatment and daughter reports gradual decline since stopping therapy ADLs Comments: has a dog she can let out to fenced yard     [employer]        Extremity/Trunk Assessment   Upper Extremity Assessment  Upper Extremity Assessment: Generalized weakness    Lower Extremity Assessment Lower Extremity Assessment: RLE deficits/detail RLE Deficits / Details: antigravity strength but weaker on MMT as compared to L per pt and daughter due to h/o back issues with 3 surgeries, so not different from baseline       Communication   Communication: No difficulties  Cognition Arousal/Alertness: Awake/alert Behavior During Therapy: WFL for tasks assessed/performed Overall Cognitive Status: Within Functional Limits for tasks assessed                                           General Comments General comments (skin integrity, edema, etc.): using cane to compensate; discussed cane height as too tall, but ok if using for balance and pt relates feels better than her shorter one; discussed follow up with MD then getting referral to outpatient PT as feels her walking is slower than previous; also educated on fall prevention with lighting, footwear, taking her time, etc.    Exercises     Assessment/Plan    PT Assessment Patient does not need any further PT services  PT Problem List         PT Treatment Interventions      PT Goals (Current goals can be found in the Care Plan section)  Acute Rehab PT Goals PT Goal Formulation: All assessment and education complete, DC therapy    Frequency       Co-evaluation               AM-PAC PT "6 Clicks" Mobility  Outcome Measure Help needed turning from your back to your side while in a flat bed without using bedrails?: None Help needed moving from lying on your back to sitting on the side of a flat bed without using bedrails?: None Help needed moving to and from a bed to a chair (including a wheelchair)?: None Help needed standing up from a chair using your arms (e.g., wheelchair or bedside chair)?: None Help needed to walk in hospital room?: A Little Help needed climbing 3-5 steps with a railing? : Total 6 Click Score: 20    End of Session Equipment Utilized During Treatment: Gait belt Activity Tolerance: Patient tolerated treatment well Patient left: in bed;with call bell/phone within reach;with family/visitor present   PT Visit Diagnosis: Other abnormalities of gait and mobility (R26.89);Muscle weakness (generalized) (M62.81)    Time: 9147-8295 PT Time Calculation (min) (ACUTE ONLY): 23 min   Charges:   PT Evaluation $PT Eval Low Complexity: 1 Low PT Treatments $Self Care/Home Management: 8-22        Sheran Lawless, PT Acute Rehabilitation  Services [phone] 06/29/2022   Elray Mcgregor 06/29/2022, 3:55 PM

## 2022-07-21 ENCOUNTER — Other Ambulatory Visit: Payer: Self-pay

## 2022-07-21 DIAGNOSIS — Z08 Encounter for follow-up examination after completed treatment for malignant neoplasm: Secondary | ICD-10-CM

## 2022-07-22 ENCOUNTER — Inpatient Hospital Stay: Payer: Medicare PPO | Admitting: Oncology

## 2022-07-22 ENCOUNTER — Encounter: Payer: Self-pay | Admitting: Oncology

## 2022-07-22 ENCOUNTER — Inpatient Hospital Stay: Payer: Medicare PPO

## 2022-07-22 ENCOUNTER — Inpatient Hospital Stay: Payer: Medicare PPO | Attending: Radiation Oncology

## 2022-07-22 VITALS — BP 93/72 | HR 96 | Resp 18 | Ht 64.0 in | Wt 246.0 lb

## 2022-07-22 DIAGNOSIS — Z87891 Personal history of nicotine dependence: Secondary | ICD-10-CM | POA: Insufficient documentation

## 2022-07-22 DIAGNOSIS — Z9221 Personal history of antineoplastic chemotherapy: Secondary | ICD-10-CM | POA: Insufficient documentation

## 2022-07-22 DIAGNOSIS — Z853 Personal history of malignant neoplasm of breast: Secondary | ICD-10-CM

## 2022-07-22 DIAGNOSIS — N179 Acute kidney failure, unspecified: Secondary | ICD-10-CM | POA: Insufficient documentation

## 2022-07-22 DIAGNOSIS — Z800 Family history of malignant neoplasm of digestive organs: Secondary | ICD-10-CM | POA: Insufficient documentation

## 2022-07-22 DIAGNOSIS — Z803 Family history of malignant neoplasm of breast: Secondary | ICD-10-CM | POA: Insufficient documentation

## 2022-07-22 DIAGNOSIS — Z08 Encounter for follow-up examination after completed treatment for malignant neoplasm: Secondary | ICD-10-CM

## 2022-07-22 DIAGNOSIS — C50412 Malignant neoplasm of upper-outer quadrant of left female breast: Secondary | ICD-10-CM | POA: Diagnosis present

## 2022-07-22 DIAGNOSIS — Z79899 Other long term (current) drug therapy: Secondary | ICD-10-CM | POA: Diagnosis not present

## 2022-07-22 DIAGNOSIS — Z923 Personal history of irradiation: Secondary | ICD-10-CM | POA: Insufficient documentation

## 2022-07-22 DIAGNOSIS — Z79811 Long term (current) use of aromatase inhibitors: Secondary | ICD-10-CM

## 2022-07-22 DIAGNOSIS — Z170 Estrogen receptor positive status [ER+]: Secondary | ICD-10-CM | POA: Diagnosis not present

## 2022-07-22 DIAGNOSIS — Z5181 Encounter for therapeutic drug level monitoring: Secondary | ICD-10-CM

## 2022-07-22 LAB — CBC WITH DIFFERENTIAL (CANCER CENTER ONLY)
Abs Immature Granulocytes: 0.02 10*3/uL (ref 0.00–0.07)
Basophils Absolute: 0 10*3/uL (ref 0.0–0.1)
Basophils Relative: 0 %
Eosinophils Absolute: 0.1 10*3/uL (ref 0.0–0.5)
Eosinophils Relative: 1 %
HCT: 35.2 % — ABNORMAL LOW (ref 36.0–46.0)
Hemoglobin: 11.6 g/dL — ABNORMAL LOW (ref 12.0–15.0)
Immature Granulocytes: 0 %
Lymphocytes Relative: 21 %
Lymphs Abs: 1.4 10*3/uL (ref 0.7–4.0)
MCH: 29.9 pg (ref 26.0–34.0)
MCHC: 33 g/dL (ref 30.0–36.0)
MCV: 90.7 fL (ref 80.0–100.0)
Monocytes Absolute: 0.6 10*3/uL (ref 0.1–1.0)
Monocytes Relative: 9 %
Neutro Abs: 4.5 10*3/uL (ref 1.7–7.7)
Neutrophils Relative %: 69 %
Platelet Count: 254 10*3/uL (ref 150–400)
RBC: 3.88 MIL/uL (ref 3.87–5.11)
RDW: 14.5 % (ref 11.5–15.5)
WBC Count: 6.7 10*3/uL (ref 4.0–10.5)
nRBC: 0 % (ref 0.0–0.2)

## 2022-07-22 LAB — CMP (CANCER CENTER ONLY)
ALT: 23 U/L (ref 0–44)
AST: 19 U/L (ref 15–41)
Albumin: 4.1 g/dL (ref 3.5–5.0)
Alkaline Phosphatase: 118 U/L (ref 38–126)
Anion gap: 12 (ref 5–15)
BUN: 37 mg/dL — ABNORMAL HIGH (ref 8–23)
CO2: 26 mmol/L (ref 22–32)
Calcium: 10.9 mg/dL — ABNORMAL HIGH (ref 8.9–10.3)
Chloride: 98 mmol/L (ref 98–111)
Creatinine: 1.79 mg/dL — ABNORMAL HIGH (ref 0.44–1.00)
GFR, Estimated: 30 mL/min — ABNORMAL LOW (ref >60)
Glucose, Bld: 101 mg/dL — ABNORMAL HIGH (ref 70–99)
Potassium: 3.3 mmol/L — ABNORMAL LOW (ref 3.5–5.1)
Sodium: 136 mmol/L (ref 135–145)
Total Bilirubin: 0.7 mg/dL (ref 0.3–1.2)
Total Protein: 7.3 g/dL (ref 6.5–8.1)

## 2022-07-23 ENCOUNTER — Encounter: Payer: Self-pay | Admitting: Oncology

## 2022-07-23 NOTE — Progress Notes (Signed)
Hematology/Oncology Consult note [facility]  [phone] [phone]  Patient Care Team: Oneal Grout, FNP as PCP - General (Family Medicine) Irene Limbo., MD as Consulting Physician (Ophthalmology) Dimmig, Maisie Fus, MD as Referring Physician (Orthopedic Surgery) Debbrah Alar, MD (Dermatology) Dasher, Cliffton Asters, MD (Dermatology) Creig Hines, MD as Consulting Physician (Hematology and Oncology) Carmina Miller, MD as Consulting Physician (Radiation Oncology) Lemar Livings Merrily Pew, MD as Consulting Physician (General Surgery)   Name of the patient: Victoria Dean  [id number]  Aug 12, 1950   Date of visit: 07/23/22  Diagnosis- pathological prognostic stage Ib invasive lobular carcinoma of the left breast [id number] ER/PR positive HER2 negative   Chief complaint/ Reason for visit- routine f/u of breast cancer  Heme/Onc history:  Patient is a 72 year old female with a past medical history significant for claustrophobia, hypertension hypercholesterolemia, depression among other medical problems.  Patient began to notice retraction of her left nipple.  This was followed by a diagnostic mammogram which showed vague shadowing in the upper outer quadrant of the left breast without a definitive mass.  Benign cysts in the lower left breast measuring 1.5 cm.  No evidence of malignancy in the right breast.  No evidence of abnormal left axillary lymph nodes.  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   Bilateral breast MRI showed 7 x 6 x 4 cm area of non-mass enhancement in the upper outer left breast at 1 o'clock position.  This involves anterior and middle third of the breast and extension into the posterior third.  2 inferior left axillary lymph nodes with mild cortical  thickening.   PET CT scan showed 1.7 cm hypermetabolic nodule in the midline of the neck which could represent thyroglossal cyst but malignancy cannot be excluded. Patient underwent left-sided lumpectomy with nipple excision.     Final pathology showed invasive lobular carcinoma grade 210.5 cm with associated DCIS.  Skin/nipple are present and involved.  Invasive carcinoma invades the nipple without skin ulceration.  Lymphovascular invasion present.  Margins negative.  3 out of 4 lymph nodes positive for malignancy 11 mm largest deposit with extranodal extension.  pT3 pN1 a cM0   Patient was taken back for axillary dissection given 3 lymph nodes positive with extranodal extension and 4 more lymph nodes out of 9 were positive with extranodal extension   Given cardiovascular comorbidities and patient age none anthracycline adjuvant chemotherapy options were discussed.  Patient did not wish to proceed with TC chemotherapy.  She completed 12 cycles of weekly Taxol chemotherapy in October 2022 followed by adjuvant radiation therapy.      Interval history-exertional shortness of breath has improved somewhat.  She is tolerating letrozole well without any significant side effects.  Denies any breast concerns  ECOG PS- 2 Pain scale- 3  Review of systems- Review of Systems  Constitutional:  Positive for malaise/fatigue. Negative for chills, fever and weight loss.  HENT:  Negative for congestion, ear discharge and nosebleeds.   Eyes:  Negative for blurred vision.  Respiratory:  Positive for shortness of breath. Negative for cough, hemoptysis, sputum production and wheezing.   Cardiovascular:  Negative for chest pain, palpitations, orthopnea and claudication.  Gastrointestinal:  Negative for abdominal pain, blood in stool, constipation, diarrhea, heartburn, melena, nausea and vomiting.  Genitourinary:  Negative for dysuria, flank  pain, frequency, hematuria and urgency.  Musculoskeletal:  Negative for  back pain, joint pain and myalgias.  Skin:  Negative for rash.  Neurological:  Negative for dizziness, tingling, focal weakness, seizures, weakness and headaches.  Endo/Heme/Allergies:  Does not bruise/bleed easily.  Psychiatric/Behavioral:  Negative for depression and suicidal ideas. The patient does not have insomnia.       Allergies  Allergen Reactions   Other Itching and Other (See Comments)    Brand name bandaids with cloth backing causes redness and itching   Statins     Muscle weakness   Stiolto Respimat [Tiotropium Bromide-Olodaterol]    Zetia [Ezetimibe]     Leg pain, upset stomach   Sulfa Antibiotics Rash     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    Vitamin D deficiency      Past Surgical History:  Procedure Laterality Date   ABDOMINAL HYSTERECTOMY  1995   APPENDECTOMY  1994   BREAST BIOPSY Right 1998   neg   BREAST BIOPSY Left 05/30/2020   stere bx x clip path pending   BREAST LUMPECTOMY WITH SENTINEL LYMPH NODE BIOPSY Left 06/29/2020   Procedure: BREAST LUMPECTOMY WITH SENTINEL LYMPH NODE BX;  Surgeon: Earline Mayotte, MD;  Location: [facility] ORS;  Service: General;  Laterality: Left;   CATARACT EXTRACTION W/PHACO Left 01/31/2022   Procedure: CATARACT EXTRACTION PHACO AND INTRAOCULAR LENS PLACEMENT (IOC);  Surgeon: Fabio Pierce, MD;  Location: [facility];  Service: Ophthalmology;  Laterality: Left;  CDE 8.10   CATARACT EXTRACTION W/PHACO Right 02/21/2022   Procedure: CATARACT EXTRACTION PHACO  AND INTRAOCULAR LENS PLACEMENT (IOC);  Surgeon: Fabio Pierce, MD;  Location: [facility];  Service: Ophthalmology;  Laterality: Right;  CDE: 9.42   ESI     LUMBAR MICRODISCECTOMY  2019   L3-4   LUMBAR MICRODISCECTOMY  2008   L4-L5 repeated   MICRODISCECTOMY LUMBAR  2008   L4-5   MICRODISCECTOMY LUMBAR  2010   L4-L5 repeated   NODE DISSECTION Left 08/13/2020   Procedure: AXILLARY NODE DISSECTION;  Surgeon: Earline Mayotte, MD;  Location: [facility] ORS;  Service: General;  Laterality: Left;   PORTACATH PLACEMENT Right 09/05/2020   Procedure: INSERTION PORT-A-CATH;  Surgeon: Earline Mayotte, MD;  Location: [facility] ORS;  Service: General;  Laterality: Right;  right; monitor anesthesia care   SQUAMOUS CELL CARCINOMA EXCISION     TONSILLECTOMY  1971    Social History   Socioeconomic History   Marital status: Widowed    Spouse name: Not on file   Number of children: 1   Years of education: Not on file   Highest education level: Bachelor's degree (e.g., BA, AB, BS)  Occupational History   Occupation: retired  Tobacco Use   Smoking status: Former    Packs/day: 0.91    Years: 33.00    Additional pack years: 0.00    Total  pack years: 30.03    Types: Cigarettes    Quit date: 04/19/2018    Years since quitting: 4.2   Smokeless tobacco: Never  Vaping Use   Vaping Use: Never used  Substance and Sexual Activity   Alcohol use: Yes    Comment: rarely   Drug use: No   Sexual activity: Not Currently  Other Topics Concern   Not on file  Social History Narrative   Not on file   Social Determinants of Health   Financial Resource Strain: Low Risk  (10/23/2021)   Overall Financial Resource Strain (CARDIA)    Difficulty of Paying Living Expenses: Not hard at all  Food Insecurity: No Food Insecurity (10/23/2021)   Hunger Vital Sign    Worried About Running Out of Food in the Last Year: Never true    Ran Out of Food in the Last Year: Never true  Transportation Needs: No Transportation Needs  (10/23/2021)   PRAPARE - Administrator, Civil Service (Medical): No    Lack of Transportation (Non-Medical): No  Physical Activity: Inactive (02/16/2018)   Exercise Vital Sign    Days of Exercise per Week: 0 days    Minutes of Exercise per Session: 0 min  Stress: No Stress Concern Present (02/16/2018)   [employer] of Occupational Health - Occupational Stress Questionnaire    Feeling of Stress : Only a little  Social Connections: Moderately Isolated (10/23/2021)   Social Connection and Isolation Panel [NHANES]    Frequency of Communication with Friends and Family: More than three times a week    Frequency of Social Gatherings with Friends and Family: More than three times a week    Attends Religious Services: More than 4 times per year    Active Member of [employer] or Organizations: No    Attends Banker Meetings: Never    Marital Status: Widowed  Intimate Partner Violence: Not At Risk (10/23/2021)   Humiliation, Afraid, Rape, and Kick questionnaire    Fear of Current or Ex-Partner: No    Emotionally Abused: No    Physically Abused: No    Sexually Abused: No    Family History  Problem Relation Age of Onset   Breast cancer Mother 81   Osteosarcoma Mother        d. 44   Hypertension Brother    COPD Father    Hypertension Father    Breast cancer Paternal Aunt        dx 55s   Breast cancer Paternal Grandmother        dx 101s   Stomach cancer Paternal Grandfather        dx 28s   Breast cancer Paternal Aunt        dx 36s   Brain cancer Paternal Aunt        dx 59s     Current Outpatient Medications:    acetaminophen (TYLENOL) 650 MG CR tablet, Take 1,300 mg by mouth every 8 (eight) hours as needed for pain., Disp: , Rfl:    albuterol (VENTOLIN HFA) 108 (90 Base) MCG/ACT inhaler, Inhale 2 puffs into the lungs every 6 (six) hours as needed for wheezing or shortness of breath., Disp: , Rfl:    aspirin EC 81 MG tablet, Take 1 tablet (81 mg total) by mouth  daily. Swallow whole., Disp: 150 tablet, Rfl: 2   Biotin 1000 MCG tablet, Take 1,000 mcg by mouth daily., Disp: , Rfl:    Calcium Carbonate-Vit D-Min (CALCIUM 1200 PO),  Take 1 tablet by mouth 2 (two) times daily., Disp: , Rfl:    cholecalciferol (VITAMIN D) 25 MCG (1000 UNIT) tablet, Take 1,000 Units by mouth daily., Disp: , Rfl:    cyanocobalamin (VITAMIN B12) 1000 MCG/ML injection, Inject 1 mL (1,000 mcg total) into the muscle every 30 (thirty) days., Disp: 3 mL, Rfl: 4   famotidine (PEPCID) 10 MG tablet, Take by mouth. Take 10 mg by mouth 2 (two) times daily as needed for Heartburn, Disp: , Rfl:    fluticasone-salmeterol (ADVAIR) 100-50 MCG/ACT AEPB, Inhale 1 puff into the lungs 2 (two) times daily., Disp: , Rfl:    hydrocortisone 2.5 % lotion, Apply 1 application topically daily as needed (when washing face)., Disp: , Rfl:    letrozole (FEMARA) 2.5 MG tablet, TAKE 1 TABLET EVERY DAY, Disp: 90 tablet, Rfl: 3   loratadine (CLARITIN) 10 MG tablet, Take by mouth. Take 10 mg by mouth once daily, Disp: , Rfl:    losartan (COZAAR) 100 MG tablet, TAKE 1 TABLET EVERY DAY, Disp: 90 tablet, Rfl: 1   Menthol, Topical Analgesic, (BIOFREEZE EX), Apply 1 application topically daily as needed (back pain)., Disp: , Rfl:    mometasone (ELOCON) 0.1 % cream, Apply 1 application topically daily as needed (ear irritation)., Disp: , Rfl:    Propylene Glycol (SYSTANE COMPLETE) 0.6 % SOLN, Apply to eye., Disp: , Rfl:    rosuvastatin (CRESTOR) 5 MG tablet, Take 1 tablet (5 mg total) by mouth daily., Disp: 90 tablet, Rfl: 3   Semaglutide,0.25 or 0.5MG /DOS, (OZEMPIC, 0.25 OR 0.5 MG/DOSE,) 2 MG/1.5ML SOPN, Inject 0.25 mg every week by subcutaneous route for 28 days., Disp: , Rfl:    Syringe/Needle, Disp, (SYRINGE 3CC/25GX1") 25G X 1" 3 ML MISC, 1 Syringe by Does not apply route every 30 (thirty) days., Disp: 12 each, Rfl: 0   triamterene-hydrochlorothiazide (DYAZIDE) 37.5-25 MG capsule, TAKE 1 CAPSULE EVERY DAY, Disp: 90  capsule, Rfl: 1   venlafaxine XR (EFFEXOR-XR) 75 MG 24 hr capsule, TAKE 3 CAPSULES (225 MG TOTAL) BY MOUTH DAILY WITH BREAKFAST. (Patient taking differently: Take 150 mg by mouth daily with breakfast. 2 tablets at bedtime), Disp: 270 capsule, Rfl: 1   zoledronic acid (ZOMETA) 4 MG/5ML injection, Inject 4 mg into the vein once., Disp: , Rfl:    diazepam (VALIUM) 5 MG tablet, Take 1 tablet (5 mg total) by mouth every 12 (twelve) hours as needed for anxiety. (Patient not taking: Reported on 07/22/2022), Disp: 60 tablet, Rfl: 5  Physical exam:  Vitals:   07/22/22 1259 07/22/22 1309  BP:  93/72  Pulse:  96  Resp:  18  SpO2:  98%  Weight: 246 lb (111.6 kg)   Height:  5\' 4"  (1.626 m)   Physical Exam Cardiovascular:     Rate and Rhythm: Normal rate and regular rhythm.     Heart sounds: Normal heart sounds.  Pulmonary:     Effort: Pulmonary effort is normal.     Breath sounds: Normal breath sounds.  Skin:    General: Skin is warm and dry.  Neurological:     Mental Status: She is alert and oriented to person, place, and time.    Breast exam was performed in seated and lying down position. Patient is status post left lumpectomy with a well-healed surgical scar. No evidence of any palpable masses. No evidence of axillary adenopathy. No evidence of any palpable masses or lumps in the right breast. No evidence of right axillary adenopathy  Latest Ref Rng & Units 07/22/2022   12:42 PM  CMP  Glucose 70 - 99 mg/dL 604   BUN 8 - 23 mg/dL 37   Creatinine 5.40 - 1.00 mg/dL 9.81   Sodium 191 - 478 mmol/L 136   Potassium 3.5 - 5.1 mmol/L 3.3   Chloride 98 - 111 mmol/L 98   CO2 22 - 32 mmol/L 26   Calcium 8.9 - 10.3 mg/dL 29.5   Total Protein 6.5 - 8.1 g/dL 7.3   Total Bilirubin 0.3 - 1.2 mg/dL 0.7   Alkaline Phos 38 - 126 U/L 118   AST 15 - 41 U/L 19   ALT 0 - 44 U/L 23       Latest Ref Rng & Units 07/22/2022   12:42 PM  CBC  WBC 4.0 - 10.5 K/uL 6.7   Hemoglobin 12.0 - 15.0 g/dL 62.1    Hematocrit 30.8 - 46.0 % 35.2   Platelets 150 - 400 K/uL 254     No images are attached to the encounter.  ECHOCARDIOGRAM COMPLETE  Result Date: 06/29/2022    ECHOCARDIOGRAM REPORT   Patient Name:   YOVANKA KIRCHNER Date of Exam: 06/29/2022 Medical Rec #:  [id number]    Height:       64.0 in Accession #:    [id number]   Weight:       248.0 lb Date of Birth:  November 25, 1950   BSA:          2.144 m Patient Age:    71 years     BP:           142/77 mmHg Patient Gender: F            HR:           97 bpm. Exam Location:  Inpatient Procedure: 2D Echo, Color Doppler and Cardiac Doppler Indications:    TIA  History:        Patient has prior history of Echocardiogram examinations, most                 recent 10/16/2020. Arrythmias:PVC; Risk Factors:Hypertension.  Sonographer:    Darlys Gales Referring Phys: [phone] [facility]  Sonographer Comments: No parasternal window and Technically difficult study due to poor echo windows. IMPRESSIONS  1. Poor acoustic windows limits utility of study.  2. Left ventricular ejection fraction, by estimation, is 60 to 65%. The left ventricle has normal function. The left ventricle has no regional wall motion abnormalities. Left ventricular diastolic parameters are indeterminate.  3. Right ventricular systolic function was not well visualized. The right ventricular size is normal.  4. The mitral valve was not well visualized. No evidence of mitral valve regurgitation. No evidence of mitral stenosis.  5. Appears may be sclerotic with small gradient but no significant AS. The aortic valve was not well visualized. Aortic valve regurgitation is not visualized. No aortic stenosis is present.  6. The inferior vena cava is normal in size with greater than 50% respiratory variability, suggesting right atrial pressure of 3 mmHg. FINDINGS  Left Ventricle: Left ventricular ejection fraction, by estimation, is 60 to 65%. The left ventricle has normal function. The left ventricle has no regional wall  motion abnormalities. Definity contrast agent was given IV to delineate the left ventricular  endocardial borders. The left ventricular internal cavity size was normal in size. There is no left ventricular hypertrophy. Left ventricular diastolic parameters are indeterminate. Right Ventricle: The right ventricular size is normal. No increase  in right ventricular wall thickness. Right ventricular systolic function was not well visualized. Left Atrium: Left atrial size was not well visualized. Right Atrium: Right atrial size was not well visualized. Pericardium: There is no evidence of pericardial effusion. Mitral Valve: The mitral valve was not well visualized. No evidence of mitral valve regurgitation. No evidence of mitral valve stenosis. Tricuspid Valve: The tricuspid valve is not well visualized. Tricuspid valve regurgitation is not demonstrated. No evidence of tricuspid stenosis. Aortic Valve: Appears may be sclerotic with small gradient but no significant AS. The aortic valve was not well visualized. Aortic valve regurgitation is not visualized. No aortic stenosis is present. Aortic valve peak gradient measures 12.2 mmHg. Pulmonic Valve: The pulmonic valve was not well visualized. Pulmonic valve regurgitation is not visualized. No evidence of pulmonic stenosis. Aorta: The aortic root is normal in size and structure and the aortic root was not well visualized. Venous: The inferior vena cava is normal in size with greater than 50% respiratory variability, suggesting right atrial pressure of 3 mmHg. IAS/Shunts: No atrial level shunt detected by color flow Doppler. Additional Comments: Poor acoustic windows limits utility of study.   Diastology LV e' medial:    4.03 cm/s LV E/e' medial:  31.3 LV e' lateral:   7.40 cm/s LV E/e' lateral: 17.0  RIGHT VENTRICLE RV S prime:     12.50 cm/s TAPSE (M-mode): 1.9 cm LEFT ATRIUM           Index LA Vol (A4C): 50.5 ml 23.56 ml/m  AORTIC VALVE AV Vmax:      175.00 cm/s AV Peak  Grad: 12.2 mmHg LVOT Vmax:    127.00 cm/s LVOT Vmean:   93.400 cm/s LVOT VTI:     0.311 m MITRAL VALVE MV Area (PHT): 2.47 cm     SHUNTS MV E velocity: 126.00 cm/s  Systemic VTI: 0.31 m MV A velocity: 117.00 cm/s MV E/A ratio:  1.08 Charlton Haws MD Electronically signed by Charlton Haws MD Signature Date/Time: 06/29/2022/1:02:34 PM    Final    MR BRAIN WO CONTRAST  Result Date: 06/29/2022 CLINICAL DATA:  TIA EXAM: MRI HEAD WITHOUT CONTRAST TECHNIQUE: Multiplanar, multiecho pulse sequences of the brain and surrounding structures were obtained without intravenous contrast. COMPARISON:  Head CT and CTA from yesterday FINDINGS: Brain: No acute infarction, hemorrhage, hydrocephalus, extra-axial collection or mass lesion. Mild chronic small vessel ischemia in the cerebral white matter, less than commonly seen for patient age. Normal brain volume Vascular: Major flow voids are preserved. Skull and upper cervical spine: Normal marrow signal. Sinuses/Orbits: Bilateral cataract resection IMPRESSION: No acute finding including infarct.  Unremarkable study for age. Electronically Signed   By: Tiburcio Pea M.D.   On: 06/29/2022 07:32   CT ANGIO HEAD NECK W WO CM  Result Date: 06/28/2022 CLINICAL DATA:  Central vertigo EXAM: CT ANGIOGRAPHY HEAD AND NECK WITH AND WITHOUT CONTRAST TECHNIQUE: Multidetector CT imaging of the head and neck was performed using the standard protocol during bolus administration of intravenous contrast. Multiplanar CT image reconstructions and MIPs were obtained to evaluate the vascular anatomy. Carotid stenosis measurements (when applicable) are obtained utilizing NASCET criteria, using the distal internal carotid diameter as the denominator. RADIATION DOSE REDUCTION: This exam was performed according to the departmental dose-optimization program which includes automated exposure control, adjustment of the mA and/or kV according to patient size and/or use of iterative reconstruction technique.  CONTRAST:  75mL OMNIPAQUE IOHEXOL 350 MG/ML SOLN COMPARISON:  None Available. FINDINGS: CT HEAD FINDINGS Brain:  There is no mass, hemorrhage or extra-axial collection. The size and configuration of the ventricles and extra-axial CSF spaces are normal. There is no acute or chronic infarction. The brain parenchyma is normal. Skull: The visualized skull base, calvarium and extracranial soft tissues are normal. Sinuses/Orbits: No fluid levels or advanced mucosal thickening of the visualized paranasal sinuses. No mastoid or middle ear effusion. The orbits are normal. CTA NECK FINDINGS SKELETON: There is no bony spinal canal stenosis. No lytic or blastic lesion. OTHER NECK: Enhancing focus superficial to the midline thyroid cartilage. 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 UPPER CHEST: No pneumothorax or pleural effusion. No nodules or masses. AORTIC ARCH: There is no calcific atherosclerosis of the aortic arch. There is no aneurysm, dissection or hemodynamically significant stenosis of the visualized portion of the aorta. Conventional 3 vessel aortic branching pattern. The visualized proximal subclavian arteries are widely patent. RIGHT CAROTID SYSTEM: Normal without aneurysm, dissection or stenosis. LEFT CAROTID SYSTEM: Normal without aneurysm, dissection or stenosis. VERTEBRAL ARTERIES: Left dominant configuration. Both origins are clearly patent. There is no dissection, occlusion or flow-limiting stenosis to the skull base (V1-V3 segments). CTA HEAD FINDINGS POSTERIOR CIRCULATION: --Vertebral arteries: Normal V4 segments. --Inferior cerebellar arteries: Normal. --Basilar artery: Normal. --Superior cerebellar arteries: Normal. --Posterior cerebral arteries (PCA): Normal. ANTERIOR CIRCULATION: --Intracranial internal carotid arteries: Normal. --Anterior cerebral arteries (ACA): Normal. Both A1 segments are present. Patent anterior communicating artery (a-comm). --Middle cerebral arteries (MCA): Normal. VENOUS SINUSES: As  permitted by contrast timing, patent. ANATOMIC VARIANTS: None Review of the MIP images confirms the above findings. IMPRESSION: 1. No emergent large vessel occlusion or high-grade stenosis of the intracranial arteries. 2. 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 Electronically Signed   By: Deatra Robinson M.D.   On: 06/28/2022 23:25   DG Chest Portable 1 View  Result Date: 06/28/2022 CLINICAL DATA:  CP and SOB EXAM: PORTABLE CHEST 1 VIEW COMPARISON:  Chest x-ray 01/19/2021 FINDINGS: The heart and mediastinal contours are unchanged. No focal consolidation. No pulmonary edema. No pleural effusion. No pneumothorax. No acute osseous abnormality. IMPRESSION: No active disease. Electronically Signed   By: Tish Frederickson M.D.   On: 06/28/2022 20:27     Assessment and plan- Patient is a 72 y.o. 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  She is s/p 12 cycles of weekly Taxol chemotherapy and adjuvant radiation treatment.  She is here for routine follow-up of breast cancer on letrozole and to receive Zometa  Patient has evidence of AKI and her GFR is less than 35.  She is seeing nephrology tomorrow.  I am holding off on giving her Zometa today to avoid any prolonged hypocalcemia that can be seen in patients with AKI.  Tentative Zometa in 3 months and I will see her thereafter.  Patient will continue with letrozole and vitamin D at this time.  She has evidence of mild hypercalcemia and should hold off on taking oral calcium   Visit Diagnosis 1. Encounter for follow-up surveillance of breast cancer   2. Use of letrozole (Femara)      Dr. Owens Shark, MD, MPH [facility] [phone] 07/23/2022 8:09 AM

## 2022-09-22 ENCOUNTER — Encounter: Payer: Self-pay | Admitting: Pulmonary Disease

## 2022-10-21 ENCOUNTER — Other Ambulatory Visit: Payer: Self-pay

## 2022-10-21 DIAGNOSIS — E538 Deficiency of other specified B group vitamins: Secondary | ICD-10-CM

## 2022-10-21 DIAGNOSIS — Z170 Estrogen receptor positive status [ER+]: Secondary | ICD-10-CM

## 2022-10-22 ENCOUNTER — Encounter: Payer: Self-pay | Admitting: Oncology

## 2022-10-22 ENCOUNTER — Inpatient Hospital Stay: Payer: Medicare PPO | Attending: Radiation Oncology

## 2022-10-22 ENCOUNTER — Inpatient Hospital Stay (HOSPITAL_BASED_OUTPATIENT_CLINIC_OR_DEPARTMENT_OTHER): Payer: Medicare PPO | Admitting: Oncology

## 2022-10-22 ENCOUNTER — Inpatient Hospital Stay: Payer: Medicare PPO

## 2022-10-22 ENCOUNTER — Encounter: Payer: Self-pay | Admitting: Pulmonary Disease

## 2022-10-22 VITALS — BP 146/69 | HR 87 | Temp 97.0°F | Resp 18 | Ht 63.0 in | Wt 246.0 lb

## 2022-10-22 DIAGNOSIS — C50412 Malignant neoplasm of upper-outer quadrant of left female breast: Secondary | ICD-10-CM | POA: Insufficient documentation

## 2022-10-22 DIAGNOSIS — M85851 Other specified disorders of bone density and structure, right thigh: Secondary | ICD-10-CM | POA: Diagnosis not present

## 2022-10-22 DIAGNOSIS — Z170 Estrogen receptor positive status [ER+]: Secondary | ICD-10-CM | POA: Insufficient documentation

## 2022-10-22 DIAGNOSIS — E538 Deficiency of other specified B group vitamins: Secondary | ICD-10-CM

## 2022-10-22 DIAGNOSIS — Z5181 Encounter for therapeutic drug level monitoring: Secondary | ICD-10-CM

## 2022-10-22 DIAGNOSIS — Z79899 Other long term (current) drug therapy: Secondary | ICD-10-CM

## 2022-10-22 DIAGNOSIS — Z08 Encounter for follow-up examination after completed treatment for malignant neoplasm: Secondary | ICD-10-CM

## 2022-10-22 DIAGNOSIS — Z79811 Long term (current) use of aromatase inhibitors: Secondary | ICD-10-CM | POA: Diagnosis not present

## 2022-10-22 DIAGNOSIS — Z923 Personal history of irradiation: Secondary | ICD-10-CM

## 2022-10-22 LAB — CMP (CANCER CENTER ONLY)
ALT: 24 U/L (ref 0–44)
AST: 18 U/L (ref 15–41)
Albumin: 3.8 g/dL (ref 3.5–5.0)
Alkaline Phosphatase: 112 U/L (ref 38–126)
Anion gap: 6 (ref 5–15)
BUN: 20 mg/dL (ref 8–23)
CO2: 27 mmol/L (ref 22–32)
Calcium: 9 mg/dL (ref 8.9–10.3)
Chloride: 103 mmol/L (ref 98–111)
Creatinine: 0.8 mg/dL (ref 0.44–1.00)
GFR, Estimated: >60 mL/min (ref >60)
Glucose, Bld: 102 mg/dL — ABNORMAL HIGH (ref 70–99)
Potassium: 3.7 mmol/L (ref 3.5–5.1)
Sodium: 136 mmol/L (ref 135–145)
Total Bilirubin: 0.3 mg/dL (ref 0.3–1.2)
Total Protein: 6.8 g/dL (ref 6.5–8.1)

## 2022-10-22 LAB — CBC WITH DIFFERENTIAL (CANCER CENTER ONLY)
Abs Immature Granulocytes: 0.03 10*3/uL (ref 0.00–0.07)
Basophils Absolute: 0.1 10*3/uL (ref 0.0–0.1)
Basophils Relative: 1 %
Eosinophils Absolute: 0.1 10*3/uL (ref 0.0–0.5)
Eosinophils Relative: 2 %
HCT: 35.3 % — ABNORMAL LOW (ref 36.0–46.0)
Hemoglobin: 11.3 g/dL — ABNORMAL LOW (ref 12.0–15.0)
Immature Granulocytes: 0 %
Lymphocytes Relative: 27 %
Lymphs Abs: 1.9 10*3/uL (ref 0.7–4.0)
MCH: 29.2 pg (ref 26.0–34.0)
MCHC: 32 g/dL (ref 30.0–36.0)
MCV: 91.2 fL (ref 80.0–100.0)
Monocytes Absolute: 0.8 10*3/uL (ref 0.1–1.0)
Monocytes Relative: 12 %
Neutro Abs: 3.9 10*3/uL (ref 1.7–7.7)
Neutrophils Relative %: 58 %
Platelet Count: 241 10*3/uL (ref 150–400)
RBC: 3.87 MIL/uL (ref 3.87–5.11)
RDW: 14.6 % (ref 11.5–15.5)
WBC Count: 6.8 10*3/uL (ref 4.0–10.5)
nRBC: 0 % (ref 0.0–0.2)

## 2022-10-22 MED ORDER — SODIUM CHLORIDE 0.9 % IV SOLN
INTRAVENOUS | Status: DC
  Filled 2022-10-22: qty 250

## 2022-10-22 MED ORDER — ZOLEDRONIC ACID 4 MG/100ML IV SOLN
4.0000 mg | Freq: Once | INTRAVENOUS | Status: AC
  Administered 2022-10-22: 4 mg via INTRAVENOUS
  Filled 2022-10-22: qty 100

## 2022-10-22 NOTE — Progress Notes (Signed)
Hematology/Oncology Consult note [facility]  [phone] [phone]  Patient Care Team: Oneal Grout, FNP as PCP - General (Family Medicine) Irene Limbo., MD as Consulting Physician (Ophthalmology) Dimmig, Maisie Fus, MD as Referring Physician (Orthopedic Surgery) Debbrah Alar, MD (Dermatology) Dasher, Cliffton Asters, MD (Dermatology) Creig Hines, MD as Consulting Physician (Hematology and Oncology) Carmina Miller, MD as Consulting Physician (Radiation Oncology) Lemar Livings Merrily Pew, MD as Consulting Physician (General Surgery)   Name of the patient: Victoria Dean  [id number]  10-[name]-1951   Date of visit: 10/22/22  Diagnosis- pathological prognostic stage Ib invasive lobular carcinoma of the left breast [id number] ER/PR positive HER2 negative     Chief complaint/ Reason for visit-routine follow-up of breast cancer  Heme/Onc history: Patient is a 72 year old female with a past medical history significant for claustrophobia, hypertension hypercholesterolemia, depression among other medical problems.  Patient began to notice retraction of her left nipple.  This was followed by a diagnostic mammogram which showed vague shadowing in the upper outer quadrant of the left breast without a definitive mass.  Benign cysts in the lower left breast measuring 1.5 cm.  No evidence of malignancy in the right breast.  No evidence of abnormal left axillary lymph nodes.  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   Bilateral breast MRI showed 7 x 6 x 4 cm area of non-mass enhancement in the upper outer left breast at 1 o'clock position.  This involves anterior and middle third of the breast and extension into the posterior third.  2 inferior left axillary lymph nodes with mild  cortical thickening.   PET CT scan showed 1.7 cm hypermetabolic nodule in the midline of the neck which could represent thyroglossal cyst but malignancy cannot be excluded. Patient underwent left-sided lumpectomy with nipple excision.     Final pathology showed invasive lobular carcinoma grade 210.5 cm with associated DCIS.  Skin/nipple are present and involved.  Invasive carcinoma invades the nipple without skin ulceration.  Lymphovascular invasion present.  Margins negative.  3 out of 4 lymph nodes positive for malignancy 11 mm largest deposit with extranodal extension.  pT3 pN1 a cM0   Patient was taken back for axillary dissection given 3 lymph nodes positive with extranodal extension and 4 more lymph nodes out of 9 were positive with extranodal extension   Given cardiovascular comorbidities and patient age none anthracycline adjuvant chemotherapy options were discussed.  Patient did not wish to proceed with TC chemotherapy.  She completed 12 cycles of weekly Taxol chemotherapy in October 2022 followed by adjuvant radiation therapy.       Interval history-denies any breast concerns at this time.  Tolerating letrozole well.  She is taking vitamin D but is not taking calcium because of borderline high calcium levels.  She is also following up with nephrology for her CKD.  ECOG PS- 2 Pain scale- 0   Review of systems- Review of Systems  Constitutional:  Positive for malaise/fatigue. Negative for chills, fever and weight loss.  HENT:  Negative for congestion, ear discharge and nosebleeds.   Eyes:  Negative for blurred vision.  Respiratory:  Negative for cough, hemoptysis, sputum production, shortness of breath and wheezing.   Cardiovascular:  Negative for chest pain, palpitations, orthopnea and claudication.  Gastrointestinal:  Negative for abdominal pain, blood  in stool, constipation, diarrhea, heartburn, melena, nausea and vomiting.  Genitourinary:  Negative for dysuria, flank pain,  frequency, hematuria and urgency.  Musculoskeletal:  Negative for back pain, joint pain and myalgias.  Skin:  Negative for rash.  Neurological:  Negative for dizziness, tingling, focal weakness, seizures, weakness and headaches.  Endo/Heme/Allergies:  Does not bruise/bleed easily.  Psychiatric/Behavioral:  Negative for depression and suicidal ideas. The patient does not have insomnia.       Allergies  Allergen Reactions   Other Itching and Other (See Comments)    Brand name bandaids with cloth backing causes redness and itching   Statins     Muscle weakness   Stiolto Respimat [Tiotropium Bromide-Olodaterol]    Zetia [Ezetimibe]     Leg pain, upset stomach   Sulfa Antibiotics Rash     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    Vitamin D deficiency      Past Surgical History:  Procedure Laterality Date   ABDOMINAL HYSTERECTOMY  1995   APPENDECTOMY  1994   BREAST BIOPSY Right 1998   neg   BREAST BIOPSY Left 05/30/2020   stere bx x clip path pending   BREAST LUMPECTOMY WITH SENTINEL LYMPH NODE BIOPSY Left 06/29/2020   Procedure: BREAST LUMPECTOMY WITH SENTINEL LYMPH NODE BX;  Surgeon: Earline Mayotte, MD;  Location: [facility] OR[facility];  Service: General;  Laterality: Left;   CATARACT EXTRACTION W/PHACO Left 01/31/2022   Procedure: CATARACT EXTRACTION PHACO AND INTRAOCULAR LENS PLACEMENT (IOC);  Surgeon: Fabio Pierce, MD;  Location: [facility];  Service: Ophthalmology;  Laterality: Left;  CDE 8.10   CATARACT  EXTRACTION W/PHACO Right 02/21/2022   Procedure: CATARACT EXTRACTION PHACO AND INTRAOCULAR LENS PLACEMENT (IOC);  Surgeon: Fabio Pierce, MD;  Location: [facility];  Service: Ophthalmology;  Laterality: Right;  CDE: 9.42   ESI     LUMBAR MICRODISCECTOMY  2019   L3-4   LUMBAR MICRODISCECTOMY  2008   L4-L5 repeated   MICRODISCECTOMY LUMBAR  2008   L4-5   MICRODISCECTOMY LUMBAR  2010   L4-L5 repeated   NODE DISSECTION Left 08/13/2020   Procedure: AXILLARY NODE DISSECTION;  Surgeon: Earline Mayotte, MD;  Location: [facility] OR[facility];  Service: General;  Laterality: Left;   PORTACATH PLACEMENT Right 09/05/2020   Procedure: INSERTION PORT-A-CATH;  Surgeon: Earline Mayotte, MD;  Location: [facility];  Service: General;  Laterality: Right;  right; monitor anesthesia care   SQUAMOUS CELL CARCINOMA EXCISION     TONSILLECTOMY  1971    Social History   Socioeconomic History   Marital status: Widowed    Spouse name: Not on file   Number of children: 1   Years of education: Not on file   Highest education level: Bachelor's degree (e.g., BA, AB, BS)  Occupational History   Occupation: retired  Tobacco Use   Smoking status: Former    Current packs/day:  0.00    Average packs/day: 0.9 packs/day for 33.0 years (30.0 ttl pk-yrs)    Types: Cigarettes    Start date: 04/18/1985    Quit date: 04/19/2018    Years since quitting: 4.5   Smokeless tobacco: Never  Vaping Use   Vaping status: Never Used  Substance and Sexual Activity   Alcohol use: Yes    Comment: rarely   Drug use: No   Sexual activity: Not Currently  Other Topics Concern   Not on file  Social History Narrative   Not on file   Social Determinants of Health   Financial Resource Strain: Low Risk  (10/23/2021)   Overall Financial Resource Strain (CARDIA)    Difficulty of Paying Living Expenses: Not hard at all  Food Insecurity: No Food Insecurity (10/23/2021)   Hunger Vital Sign    Worried About Running Out of Food in the Last Year: Never  true    Ran Out of Food in the Last Year: Never true  Transportation Needs: No Transportation Needs (10/23/2021)   PRAPARE - Administrator, Civil Service (Medical): No    Lack of Transportation (Non-Medical): No  Physical Activity: Inactive (02/16/2018)   Exercise Vital Sign    Days of Exercise per Week: 0 days    Minutes of Exercise per Session: 0 min  Stress: No Stress Concern Present (02/16/2018)   [employer] of Occupational Health - Occupational Stress Questionnaire    Feeling of Stress : Only a little  Social Connections: Moderately Isolated (10/23/2021)   Social Connection and Isolation Panel [NHANES]    Frequency of Communication with Friends and Family: More than three times a week    Frequency of Social Gatherings with Friends and Family: More than three times a week    Attends Religious Services: More than 4 times per year    Active Member of [employer] or Organizations: No    Attends Banker Meetings: Never    Marital Status: Widowed  Intimate Partner Violence: Not At Risk (10/23/2021)   Humiliation, Afraid, Rape, and Kick questionnaire    Fear of Current or Ex-Partner: No    Emotionally Abused: No    Physically Abused: No    Sexually Abused: No    Family History  Problem Relation Age of Onset   Breast cancer Mother 64   Osteosarcoma Mother        d. 83   Hypertension Brother    COPD Father    Hypertension Father    Breast cancer Paternal Aunt        dx 50s   Breast cancer Paternal Grandmother        dx 28s   Stomach cancer Paternal Grandfather        dx 35s   Breast cancer Paternal Aunt        dx 81s   Brain cancer Paternal Aunt        dx 59s     Current Outpatient Medications:    acetaminophen (TYLENOL) 650 MG CR tablet, Take 1,300 mg by mouth every 8 (eight) hours as needed for pain., Disp: , Rfl:    albuterol (VENTOLIN HFA) 108 (90 Base) MCG/ACT inhaler, Inhale 2 puffs into the lungs every 6 (six) hours as needed for wheezing or  shortness of breath., Disp: , Rfl:    amLODipine (NORVASC) 5 MG tablet, Take by mouth., Disp: , Rfl:    aspirin EC 81 MG tablet, Take 1 tablet (81 mg total) by mouth daily.  Swallow whole., Disp: 150 tablet, Rfl: 2   Biotin 1000 MCG tablet, Take 1,000 mcg by mouth daily., Disp: , Rfl:    cholecalciferol (VITAMIN D) 25 MCG (1000 UNIT) tablet, Take 1,000 Units by mouth daily., Disp: , Rfl:    cyanocobalamin (VITAMIN B12) 1000 MCG/ML injection, Inject 1 mL (1,000 mcg total) into the muscle every 30 (thirty) days., Disp: 3 mL, Rfl: 4   famotidine (PEPCID) 10 MG tablet, Take by mouth. Take 10 mg by mouth 2 (two) times daily as needed for Heartburn, Disp: , Rfl:    hydrocortisone 2.5 % lotion, Apply 1 application topically daily as needed (when washing face)., Disp: , Rfl:    letrozole (FEMARA) 2.5 MG tablet, TAKE 1 TABLET EVERY DAY, Disp: 90 tablet, Rfl: 3   loratadine (CLARITIN) 10 MG tablet, Take by mouth. Take 10 mg by mouth once daily, Disp: , Rfl:    losartan (COZAAR) 100 MG tablet, TAKE 1 TABLET EVERY DAY, Disp: 90 tablet, Rfl: 1   Menthol, Topical Analgesic, (BIOFREEZE EX), Apply 1 application topically daily as needed (back pain)., Disp: , Rfl:    mometasone (ELOCON) 0.1 % cream, Apply 1 application topically daily as needed (ear irritation)., Disp: , Rfl:    Propylene Glycol (SYSTANE COMPLETE) 0.6 % SOLN, Apply to eye., Disp: , Rfl:    rosuvastatin (CRESTOR) 5 MG tablet, Take 1 tablet (5 mg total) by mouth daily., Disp: 90 tablet, Rfl: 3   Semaglutide,0.25 or 0.5MG /DOS, (OZEMPIC, 0.25 OR 0.5 MG/DOSE,) 2 MG/1.5ML SOPN, Inject 0.25 mg every week by subcutaneous route for 28 days., Disp: , Rfl:    Syringe/Needle, Disp, (SYRINGE 3CC/25GX1") 25G X 1" 3 ML MISC, 1 Syringe by Does not apply route every 30 (thirty) days., Disp: 12 each, Rfl: 0   venlafaxine XR (EFFEXOR-XR) 75 MG 24 hr capsule, TAKE 3 CAPSULES (225 MG TOTAL) BY MOUTH DAILY WITH BREAKFAST. (Patient taking differently: Take 150 mg by mouth  daily with breakfast. 2 tablets at bedtime), Disp: 270 capsule, Rfl: 1   zoledronic acid (ZOMETA) 4 MG/5ML injection, Inject 4 mg into the vein once., Disp: , Rfl:    Calcium Carbonate-Vit D-Min (CALCIUM 1200 PO), Take 1 tablet by mouth 2 (two) times daily. (Patient not taking: Reported on 10/22/2022), Disp: , Rfl:    diazepam (VALIUM) 5 MG tablet, Take 1 tablet (5 mg total) by mouth every 12 (twelve) hours as needed for anxiety. (Patient not taking: Reported on 07/22/2022), Disp: 60 tablet, Rfl: 5   fluticasone-salmeterol (ADVAIR) 100-50 MCG/ACT AEPB, Inhale 1 puff into the lungs 2 (two) times daily. (Patient not taking: Reported on 10/22/2022), Disp: , Rfl:    triamterene-hydrochlorothiazide (DYAZIDE) 37.5-25 MG capsule, TAKE 1 CAPSULE EVERY DAY (Patient not taking: Reported on 10/22/2022), Disp: 90 capsule, Rfl: 1 No current facility-administered medications for this visit.  Facility-Administered Medications Ordered in Other Visits:    0.9 %  sodium chloride infusion, , Intravenous, Continuous, Creig Hines, MD, Stopped at 10/22/22 1103  Physical exam:  Vitals:   10/22/22 0946  BP: (!) 146/69  Pulse: 87  Resp: 18  Temp: (!) 97 F (36.1 C)  TempSrc: Tympanic  SpO2: 96%  Weight: 246 lb (111.6 kg)  Height: 5\' 3"  (1.6 m)   Physical Exam Cardiovascular:     Rate and Rhythm: Normal rate and regular rhythm.     Heart sounds: Normal heart sounds.  Pulmonary:     Effort: Pulmonary effort is normal.     Breath sounds: Normal breath sounds.  Skin:    General: Skin is warm and dry.  Neurological:     Mental Status: She is alert and oriented to person, place, and time.         Latest Ref Rng & Units 10/22/2022    9:16 AM  CMP  Glucose 70 - 99 mg/dL 409   BUN 8 - 23 mg/dL 20   Creatinine 8.11 - 1.00 mg/dL 9.14   Sodium 782 - 956 mmol/L 136   Potassium 3.5 - 5.1 mmol/L 3.7   Chloride 98 - 111 mmol/L 103   CO2 22 - 32 mmol/L 27   Calcium 8.9 - 10.3 mg/dL 9.0   Total Protein 6.5 -  8.1 g/dL 6.8   Total Bilirubin 0.3 - 1.2 mg/dL 0.3   Alkaline Phos 38 - 126 U/L 112   AST 15 - 41 U/L 18   ALT 0 - 44 U/L 24       Latest Ref Rng & Units 10/22/2022    9:16 AM  CBC  WBC 4.0 - 10.5 K/uL 6.8   Hemoglobin 12.0 - 15.0 g/dL 21.3   Hematocrit 08.6 - 46.0 % 35.3   Platelets 150 - 400 K/uL 241     Assessment and plan- Patient is a 72 y.o. 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  She is s/p 12 cycles of weekly Taxol chemotherapy and adjuvant radiation treatment.  She is here for routine follow-up of breast cancer and to receive Zometa  Patient has been receiving Zometa every 3 months starting in March 2023.  Calcium and kidney numbers acceptable to proceed with Zometa today.  I will see her back in 3 months for her next dose of Zometa.  From her breast cancer standpointIs doing well with no concerning symptoms of recurrence based on today's exam.  Her mammogram from May 2024 was unremarkable.  She will need a bone density scan in January 2025 which we will schedule.  Baseline bone density scan showed osteopenia with a T-score of -1.1 in the right femur neck   Visit Diagnosis 1. Encounter for follow-up surveillance of breast cancer   2. Encounter for monitoring zoledronic acid therapy   3. High risk medication use   4. Use of letrozole (Femara)      Dr. Owens Shark, MD, MPH [facility] [phone] 10/22/2022 1:11 PM

## 2022-12-30 IMAGING — CT CT CHEST LUNG CANCER SCREENING LOW DOSE W/O CM
2 of 5 series · 15 of 40 positions shown, 18 images · non-contrast
Comparison: 02/25/2019.

CLINICAL DATA: Former smoker, quit 2 years ago, 30 pack-year
history.

EXAM:
CT CHEST WITHOUT CONTRAST LOW-DOSE FOR LUNG CANCER SCREENING
TECHNIQUE: Multidetector CT imaging of the chest was performed following the
standard protocol without IV contrast.

[Series 3: lung 1.00 · axial · 0.81mm/px · z∈[-1206,-887]mm · 12 of 351 slices shown, 15 images]
[im 16/351  mediastinal]
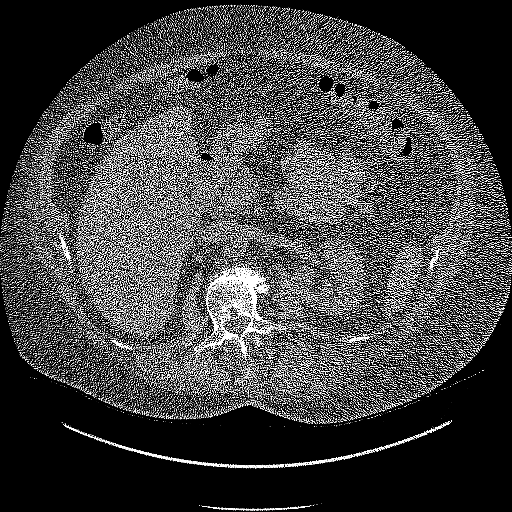
[im 16/351  lung]
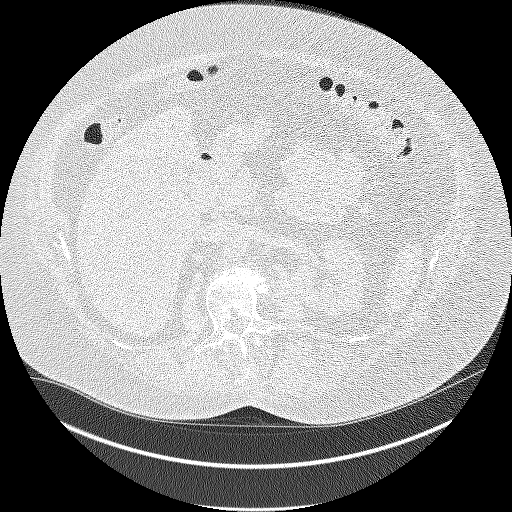
[im 48/351  lung]
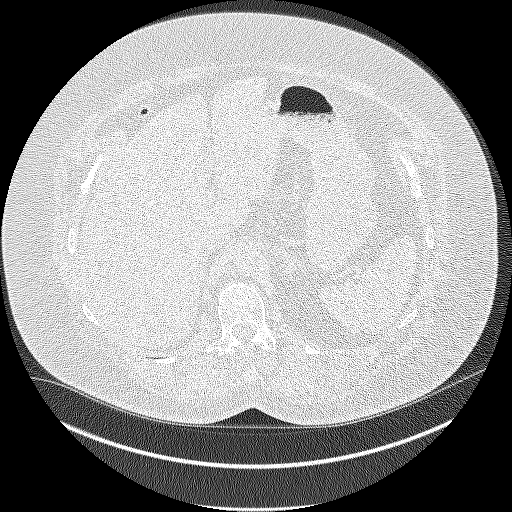
[im 80/351  lung]
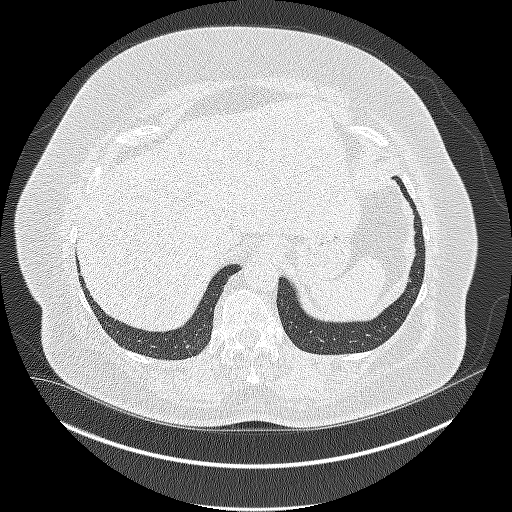
[im 112/351  lung]
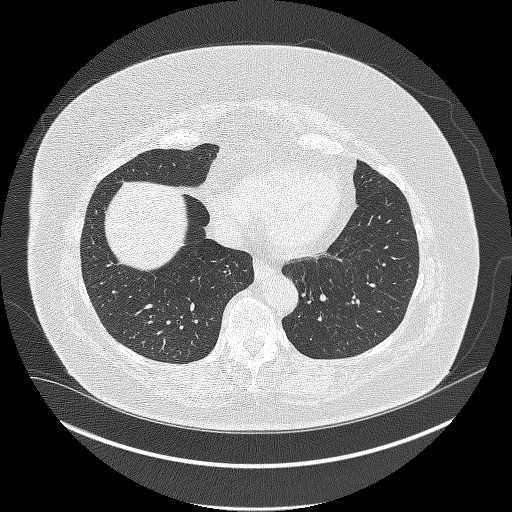
[im 128/351  mediastinal]
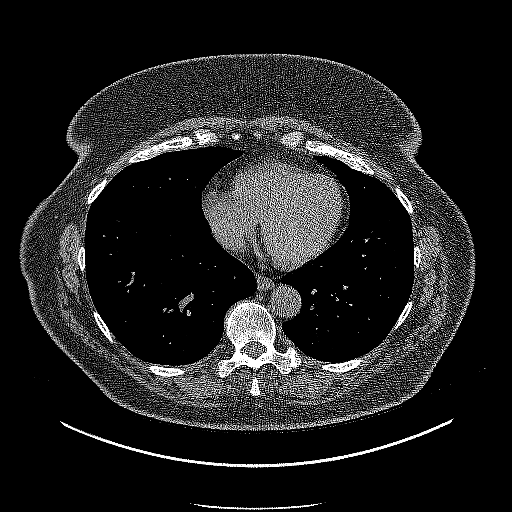
[im 128/351  lung]
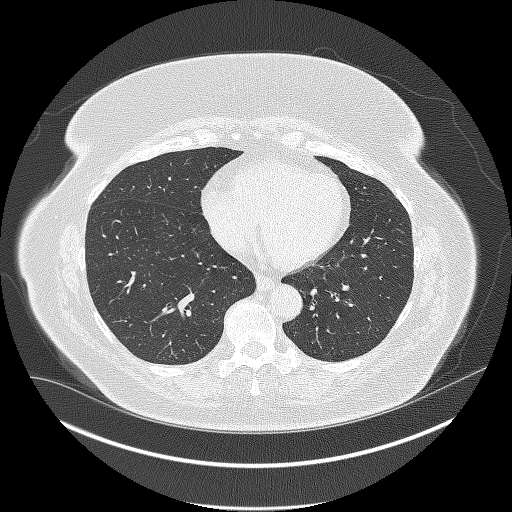
[im 160/351  lung]
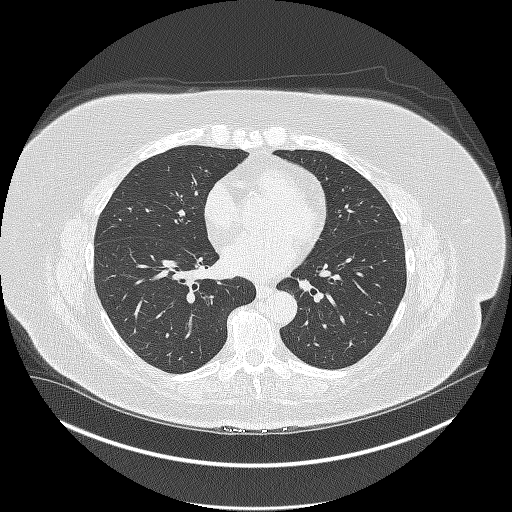
[im 191/351  lung]
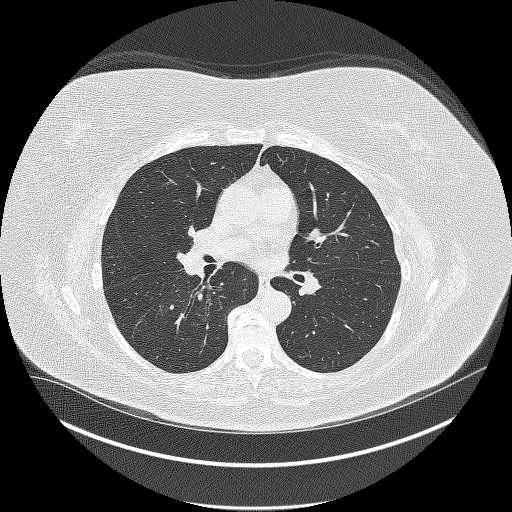
[im 223/351  lung]
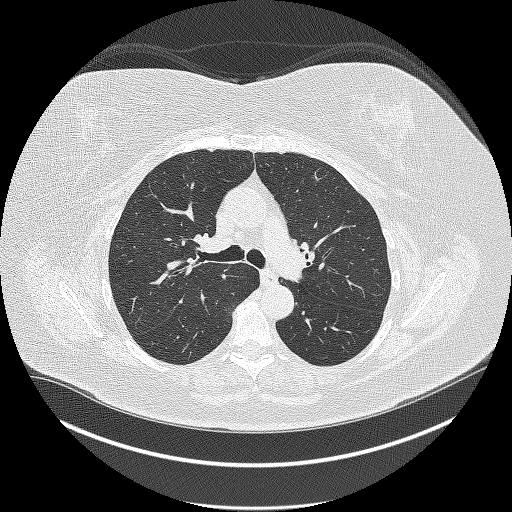
[im 239/351  mediastinal]
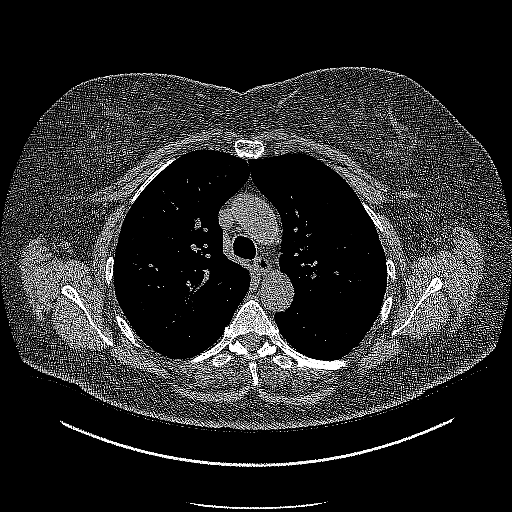
[im 239/351  lung]
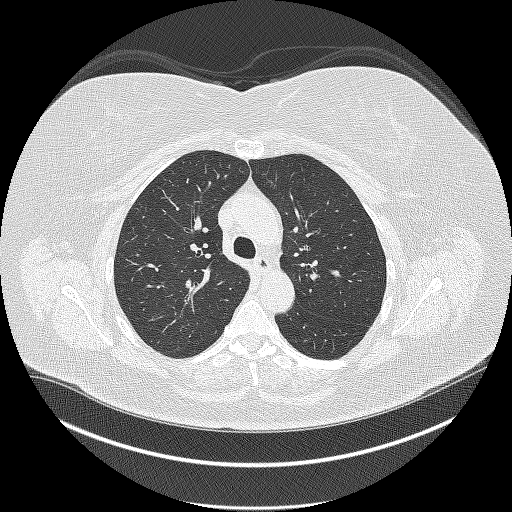
[im 271/351  lung]
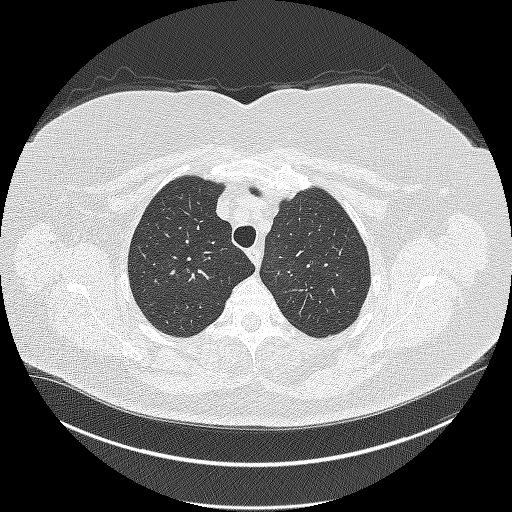
[im 303/351  lung]
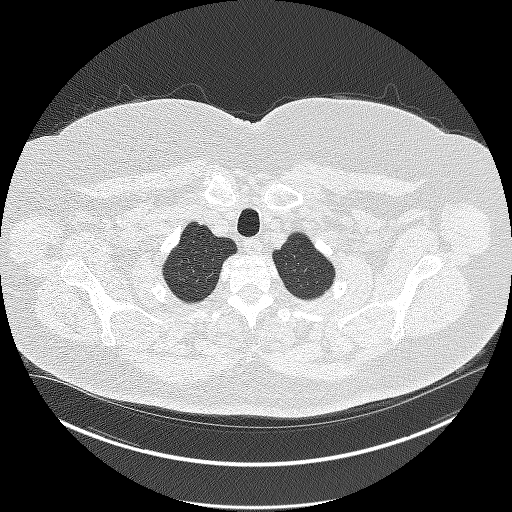
[im 335/351  lung]
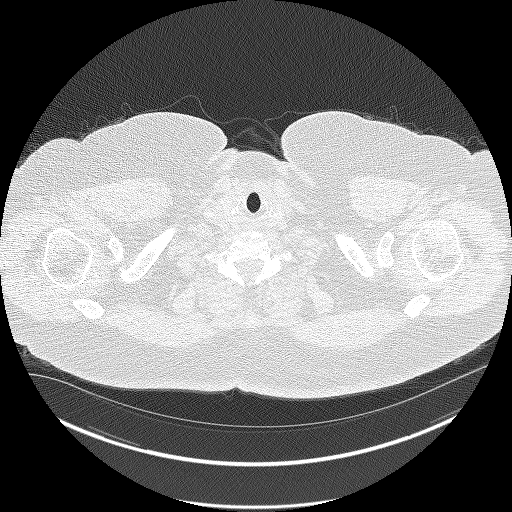

[Series 4: coronals lung 1.00 cor · coronal · 0.69mm/px · 3 of 325 slices shown]
[im 65/325  lung]
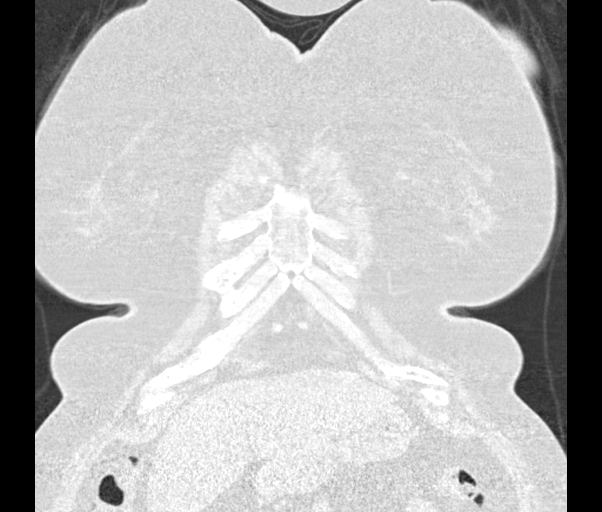
[im 130/325  lung]
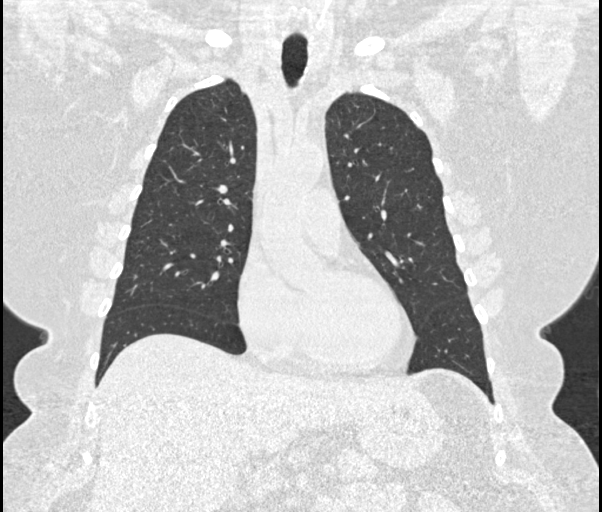
[im 195/325  lung]
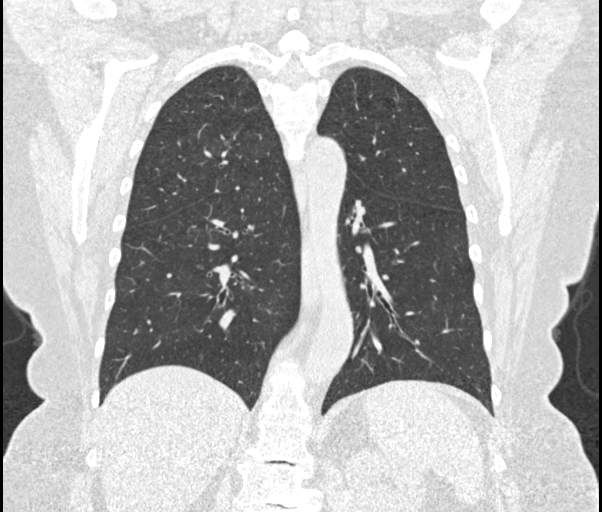

[15 of 40 positions shown; findings below may reference images not displayed]

FINDINGS: Cardiovascular: Atherosclerotic calcification of the aorta, aortic
valve and coronary arteries. Heart size normal. No pericardial
effusion.

Mediastinum/Nodes: No pathologically enlarged mediastinal or
axillary lymph nodes. Hilar regions are difficult to definitively
evaluate without IV contrast but appear grossly unremarkable.
Esophagus is grossly unremarkable.

Lungs/Pleura: Mild centrilobular emphysema. Residual smoking related
respiratory bronchiolitis. 1.7 mm posterior right upper lobe nodule,
unchanged. No new pulmonary nodules. No pleural fluid. Airway is
unremarkable.

Upper Abdomen: Liver is somewhat heterogeneous in attenuation.
Visualized portions of the liver, gallbladder, adrenal glands, left
kidney, spleen, pancreas, stomach and bowel are otherwise
unremarkable.

Musculoskeletal: Degenerative changes in the spine. No worrisome
lytic or sclerotic lesion.
IMPRESSION: 1. Lung-RADS 2, benign appearance or behavior. Continue annual
screening with low-dose chest CT without contrast in 12 months.
2. Hepatic steatosis.
3. Aortic atherosclerosis (MCRO7-GZ9.9). Coronary artery
calcification.
4.  Emphysema (MCRO7-HWR.9).

## 2023-01-03 ENCOUNTER — Other Ambulatory Visit: Payer: Self-pay | Admitting: Oncology

## 2023-01-05 ENCOUNTER — Encounter: Payer: Self-pay | Admitting: Oncology

## 2023-01-11 ENCOUNTER — Other Ambulatory Visit: Payer: Self-pay

## 2023-01-11 ENCOUNTER — Emergency Department
Admission: EM | Admit: 2023-01-11 | Discharge: 2023-01-11 | Discharge status: Against Medical Advice | Payer: Medicare PPO | Attending: Emergency Medicine | Admitting: Emergency Medicine

## 2023-01-11 ENCOUNTER — Emergency Department: Payer: Medicare PPO

## 2023-01-11 DIAGNOSIS — R002 Palpitations: Secondary | ICD-10-CM | POA: Diagnosis not present

## 2023-01-11 DIAGNOSIS — Z5321 Procedure and treatment not carried out due to patient leaving prior to being seen by health care provider: Secondary | ICD-10-CM | POA: Insufficient documentation

## 2023-01-11 DIAGNOSIS — R0602 Shortness of breath: Secondary | ICD-10-CM | POA: Diagnosis present

## 2023-01-11 LAB — BASIC METABOLIC PANEL
Anion gap: 9 (ref 5–15)
BUN: 17 mg/dL (ref 8–23)
CO2: 26 mmol/L (ref 22–32)
Calcium: 8.7 mg/dL — ABNORMAL LOW (ref 8.9–10.3)
Chloride: 98 mmol/L (ref 98–111)
Creatinine, Ser: 0.87 mg/dL (ref 0.44–1.00)
GFR, Estimated: >60 mL/min (ref >60)
Glucose, Bld: 104 mg/dL — ABNORMAL HIGH (ref 70–99)
Potassium: 3.6 mmol/L (ref 3.5–5.1)
Sodium: 133 mmol/L — ABNORMAL LOW (ref 135–145)

## 2023-01-11 LAB — CBC
HCT: 37.4 % (ref 36.0–46.0)
Hemoglobin: 11.9 g/dL — ABNORMAL LOW (ref 12.0–15.0)
MCH: 28.2 pg (ref 26.0–34.0)
MCHC: 31.8 g/dL (ref 30.0–36.0)
MCV: 88.6 fL (ref 80.0–100.0)
Platelets: 222 10*3/uL (ref 150–400)
RBC: 4.22 MIL/uL (ref 3.87–5.11)
RDW: 15.7 % — ABNORMAL HIGH (ref 11.5–15.5)
WBC: 7.1 10*3/uL (ref 4.0–10.5)
nRBC: 0 % (ref 0.0–0.2)

## 2023-01-11 LAB — TROPONIN I (HIGH SENSITIVITY): Troponin I (High Sensitivity): 14 ng/L (ref <18)

## 2023-01-11 NOTE — ED Notes (Signed)
Pt upset that she is still waiting. RN apologetic to pt. Pt reports she is leaving and will follow up with her primary MD

## 2023-01-11 NOTE — ED Triage Notes (Signed)
Pt sts that she has been having sob and palpitations for the last several days. Pt sts that it is getting worse.

## 2023-01-12 ENCOUNTER — Emergency Department (HOSPITAL_COMMUNITY): Payer: Medicare PPO

## 2023-01-12 ENCOUNTER — Encounter (HOSPITAL_COMMUNITY): Payer: Self-pay

## 2023-01-12 ENCOUNTER — Other Ambulatory Visit: Payer: Self-pay

## 2023-01-12 ENCOUNTER — Inpatient Hospital Stay (HOSPITAL_COMMUNITY)
Admission: EM | Admit: 2023-01-12 | Discharge: 2023-01-21 | DRG: 193 | Disposition: A | Discharge status: Home Health | Payer: Medicare PPO | Attending: Internal Medicine | Admitting: Internal Medicine

## 2023-01-12 DIAGNOSIS — E66813 Obesity, class 3: Secondary | ICD-10-CM | POA: Diagnosis present

## 2023-01-12 DIAGNOSIS — Z79899 Other long term (current) drug therapy: Secondary | ICD-10-CM

## 2023-01-12 DIAGNOSIS — J189 Pneumonia, unspecified organism: Secondary | ICD-10-CM | POA: Diagnosis present

## 2023-01-12 DIAGNOSIS — Z90710 Acquired absence of both cervix and uterus: Secondary | ICD-10-CM

## 2023-01-12 DIAGNOSIS — R5381 Other malaise: Secondary | ICD-10-CM | POA: Diagnosis present

## 2023-01-12 DIAGNOSIS — Z85828 Personal history of other malignant neoplasm of skin: Secondary | ICD-10-CM

## 2023-01-12 DIAGNOSIS — Z923 Personal history of irradiation: Secondary | ICD-10-CM | POA: Diagnosis not present

## 2023-01-12 DIAGNOSIS — Z751 Person awaiting admission to adequate facility elsewhere: Secondary | ICD-10-CM

## 2023-01-12 DIAGNOSIS — Z713 Dietary counseling and surveillance: Secondary | ICD-10-CM

## 2023-01-12 DIAGNOSIS — Z803 Family history of malignant neoplasm of breast: Secondary | ICD-10-CM

## 2023-01-12 DIAGNOSIS — Z7985 Long-term (current) use of injectable non-insulin antidiabetic drugs: Secondary | ICD-10-CM

## 2023-01-12 DIAGNOSIS — I10 Essential (primary) hypertension: Secondary | ICD-10-CM | POA: Diagnosis present

## 2023-01-12 DIAGNOSIS — J9601 Acute respiratory failure with hypoxia: Principal | ICD-10-CM | POA: Diagnosis present

## 2023-01-12 DIAGNOSIS — F32A Depression, unspecified: Secondary | ICD-10-CM | POA: Diagnosis present

## 2023-01-12 DIAGNOSIS — I2489 Other forms of acute ischemic heart disease: Secondary | ICD-10-CM | POA: Diagnosis present

## 2023-01-12 DIAGNOSIS — E861 Hypovolemia: Secondary | ICD-10-CM | POA: Diagnosis not present

## 2023-01-12 DIAGNOSIS — J159 Unspecified bacterial pneumonia: Secondary | ICD-10-CM | POA: Diagnosis present

## 2023-01-12 DIAGNOSIS — R000 Tachycardia, unspecified: Secondary | ICD-10-CM | POA: Diagnosis not present

## 2023-01-12 DIAGNOSIS — F419 Anxiety disorder, unspecified: Secondary | ICD-10-CM | POA: Diagnosis present

## 2023-01-12 DIAGNOSIS — Z8249 Family history of ischemic heart disease and other diseases of the circulatory system: Secondary | ICD-10-CM | POA: Diagnosis not present

## 2023-01-12 DIAGNOSIS — J439 Emphysema, unspecified: Secondary | ICD-10-CM | POA: Diagnosis present

## 2023-01-12 DIAGNOSIS — Z87891 Personal history of nicotine dependence: Secondary | ICD-10-CM | POA: Diagnosis not present

## 2023-01-12 DIAGNOSIS — Z825 Family history of asthma and other chronic lower respiratory diseases: Secondary | ICD-10-CM

## 2023-01-12 DIAGNOSIS — J122 Parainfluenza virus pneumonia: Principal | ICD-10-CM | POA: Diagnosis present

## 2023-01-12 DIAGNOSIS — Z961 Presence of intraocular lens: Secondary | ICD-10-CM | POA: Diagnosis present

## 2023-01-12 DIAGNOSIS — R739 Hyperglycemia, unspecified: Secondary | ICD-10-CM | POA: Diagnosis not present

## 2023-01-12 DIAGNOSIS — Z7982 Long term (current) use of aspirin: Secondary | ICD-10-CM

## 2023-01-12 DIAGNOSIS — Z6841 Body Mass Index (BMI) 40.0 and over, adult: Secondary | ICD-10-CM | POA: Diagnosis not present

## 2023-01-12 DIAGNOSIS — Z7951 Long term (current) use of inhaled steroids: Secondary | ICD-10-CM

## 2023-01-12 DIAGNOSIS — K219 Gastro-esophageal reflux disease without esophagitis: Secondary | ICD-10-CM | POA: Diagnosis present

## 2023-01-12 DIAGNOSIS — C50912 Malignant neoplasm of unspecified site of left female breast: Secondary | ICD-10-CM | POA: Diagnosis present

## 2023-01-12 DIAGNOSIS — E7800 Pure hypercholesterolemia, unspecified: Secondary | ICD-10-CM | POA: Diagnosis present

## 2023-01-12 DIAGNOSIS — Z1152 Encounter for screening for COVID-19: Secondary | ICD-10-CM

## 2023-01-12 LAB — PROCALCITONIN: Procalcitonin: <0.1 ng/mL

## 2023-01-12 LAB — CBC
HCT: 35.7 % — ABNORMAL LOW (ref 36.0–46.0)
Hemoglobin: 11.5 g/dL — ABNORMAL LOW (ref 12.0–15.0)
MCH: 28.5 pg (ref 26.0–34.0)
MCHC: 32.2 g/dL (ref 30.0–36.0)
MCV: 88.6 fL (ref 80.0–100.0)
Platelets: 204 10*3/uL (ref 150–400)
RBC: 4.03 MIL/uL (ref 3.87–5.11)
RDW: 15.8 % — ABNORMAL HIGH (ref 11.5–15.5)
WBC: 7.7 10*3/uL (ref 4.0–10.5)
nRBC: 0 % (ref 0.0–0.2)

## 2023-01-12 LAB — COMPREHENSIVE METABOLIC PANEL
ALT: 28 U/L (ref 0–44)
AST: 21 U/L (ref 15–41)
Albumin: 3.7 g/dL (ref 3.5–5.0)
Alkaline Phosphatase: 113 U/L (ref 38–126)
Anion gap: 10 (ref 5–15)
BUN: 16 mg/dL (ref 8–23)
CO2: 26 mmol/L (ref 22–32)
Calcium: 9.1 mg/dL (ref 8.9–10.3)
Chloride: 100 mmol/L (ref 98–111)
Creatinine, Ser: 0.85 mg/dL (ref 0.44–1.00)
GFR, Estimated: >60 mL/min (ref >60)
Glucose, Bld: 140 mg/dL — ABNORMAL HIGH (ref 70–99)
Potassium: 3.7 mmol/L (ref 3.5–5.1)
Sodium: 136 mmol/L (ref 135–145)
Total Bilirubin: 0.5 mg/dL (ref <1.2)
Total Protein: 7 g/dL (ref 6.5–8.1)

## 2023-01-12 LAB — TROPONIN I (HIGH SENSITIVITY)
Troponin I (High Sensitivity): 27 ng/L — ABNORMAL HIGH (ref <18)
Troponin I (High Sensitivity): 25 ng/L — ABNORMAL HIGH (ref <18)

## 2023-01-12 LAB — GLUCOSE, CAPILLARY
Glucose-Capillary: 152 mg/dL — ABNORMAL HIGH (ref 70–99)
Glucose-Capillary: 157 mg/dL — ABNORMAL HIGH (ref 70–99)
Glucose-Capillary: 134 mg/dL — ABNORMAL HIGH (ref 70–99)
Glucose-Capillary: 117 mg/dL — ABNORMAL HIGH (ref 70–99)

## 2023-01-12 LAB — BRAIN NATRIURETIC PEPTIDE: B Natriuretic Peptide: 65 pg/mL (ref 0.0–100.0)

## 2023-01-12 MED ORDER — IOHEXOL 350 MG/ML SOLN
100.0000 mL | Freq: Once | INTRAVENOUS | Status: AC | PRN
  Administered 2023-01-12: 100 mL via INTRAVENOUS

## 2023-01-12 MED ORDER — METHYLPREDNISOLONE SODIUM SUCC 125 MG IJ SOLR
125.0000 mg | Freq: Once | INTRAMUSCULAR | Status: AC
  Administered 2023-01-12: 125 mg via INTRAVENOUS
  Filled 2023-01-12: qty 2

## 2023-01-12 MED ORDER — ROSUVASTATIN CALCIUM 10 MG PO TABS
5.0000 mg | ORAL_TABLET | Freq: Every day | ORAL | Status: DC
  Administered 2023-01-12 – 2023-01-21 (×10): 5 mg via ORAL
  Filled 2023-01-12 (×11): qty 1

## 2023-01-12 MED ORDER — MELATONIN 3 MG PO TABS
6.0000 mg | ORAL_TABLET | Freq: Every evening | ORAL | Status: DC | PRN
  Administered 2023-01-12 – 2023-01-20 (×7): 6 mg via ORAL
  Filled 2023-01-12 (×7): qty 2

## 2023-01-12 MED ORDER — SODIUM CHLORIDE 0.9 % IV SOLN
2.0000 g | INTRAVENOUS | Status: DC
Start: 2023-01-13 — End: 2023-01-18
  Administered 2023-01-13 – 2023-01-18 (×6): 2 g via INTRAVENOUS
  Filled 2023-01-12 (×6): qty 20

## 2023-01-12 MED ORDER — ASPIRIN 81 MG PO TBEC
81.0000 mg | DELAYED_RELEASE_TABLET | Freq: Every day | ORAL | Status: DC
  Administered 2023-01-12 – 2023-01-21 (×10): 81 mg via ORAL
  Filled 2023-01-12 (×10): qty 1

## 2023-01-12 MED ORDER — AZITHROMYCIN 250 MG PO TABS
500.0000 mg | ORAL_TABLET | Freq: Every day | ORAL | Status: DC
  Administered 2023-01-12 – 2023-01-18 (×7): 500 mg via ORAL
  Filled 2023-01-12 (×7): qty 2

## 2023-01-12 MED ORDER — INSULIN ASPART 100 UNIT/ML IJ SOLN
0.0000 [IU] | Freq: Three times a day (TID) | INTRAMUSCULAR | Status: DC
  Administered 2023-01-12 (×2): 2 [IU] via SUBCUTANEOUS
  Administered 2023-01-12: 1 [IU] via SUBCUTANEOUS
  Administered 2023-01-13: 2 [IU] via SUBCUTANEOUS
  Administered 2023-01-13: 1 [IU] via SUBCUTANEOUS
  Administered 2023-01-14: 2 [IU] via SUBCUTANEOUS
  Administered 2023-01-15: 1 [IU] via SUBCUTANEOUS
  Administered 2023-01-15: 2 [IU] via SUBCUTANEOUS
  Administered 2023-01-16 – 2023-01-18 (×3): 1 [IU] via SUBCUTANEOUS
  Administered 2023-01-18: 3 [IU] via SUBCUTANEOUS
  Administered 2023-01-18: 2 [IU] via SUBCUTANEOUS
  Administered 2023-01-19 – 2023-01-21 (×2): 1 [IU] via SUBCUTANEOUS

## 2023-01-12 MED ORDER — LEVALBUTEROL HCL 1.25 MG/0.5ML IN NEBU
1.2500 mg | INHALATION_SOLUTION | Freq: Four times a day (QID) | RESPIRATORY_TRACT | Status: DC
  Administered 2023-01-12 – 2023-01-19 (×30): 1.25 mg via RESPIRATORY_TRACT
  Filled 2023-01-12 (×30): qty 0.5

## 2023-01-12 MED ORDER — INSULIN ASPART 100 UNIT/ML IJ SOLN: 0.0000 [IU] | Freq: Every day | INTRAMUSCULAR | Status: DC

## 2023-01-12 MED ORDER — FAMOTIDINE 20 MG PO TABS
20.0000 mg | ORAL_TABLET | Freq: Every day | ORAL | Status: DC
  Administered 2023-01-12 – 2023-01-21 (×10): 20 mg via ORAL
  Filled 2023-01-12 (×10): qty 1

## 2023-01-12 MED ORDER — PROCHLORPERAZINE EDISYLATE 10 MG/2ML IJ SOLN: 5.0000 mg | Freq: Four times a day (QID) | INTRAMUSCULAR | Status: DC | PRN

## 2023-01-12 MED ORDER — ENOXAPARIN SODIUM 40 MG/0.4ML IJ SOSY: 40.0000 mg | PREFILLED_SYRINGE | INTRAMUSCULAR | Status: DC

## 2023-01-12 MED ORDER — AZITHROMYCIN 500 MG IV SOLR: 500.0000 mg | INTRAVENOUS | Status: DC

## 2023-01-12 MED ORDER — MELATONIN 5 MG PO TABS: 5.0000 mg | ORAL_TABLET | Freq: Every evening | ORAL | Status: DC | PRN

## 2023-01-12 MED ORDER — SODIUM CHLORIDE 0.9 % IV SOLN: INTRAVENOUS | Status: DC

## 2023-01-12 MED ORDER — LETROZOLE 2.5 MG PO TABS
2.5000 mg | ORAL_TABLET | Freq: Every day | ORAL | Status: DC
  Administered 2023-01-12 – 2023-01-21 (×10): 2.5 mg via ORAL
  Filled 2023-01-12 (×12): qty 1

## 2023-01-12 MED ORDER — IPRATROPIUM BROMIDE 0.02 % IN SOLN
0.5000 mg | Freq: Four times a day (QID) | RESPIRATORY_TRACT | Status: DC
Start: 2023-01-12 — End: 2023-01-15
  Administered 2023-01-12 – 2023-01-15 (×13): 0.5 mg via RESPIRATORY_TRACT
  Filled 2023-01-12 (×13): qty 2.5

## 2023-01-12 MED ORDER — POLYETHYLENE GLYCOL 3350 17 G PO PACK: 17.0000 g | PACK | Freq: Every day | ORAL | Status: DC | PRN

## 2023-01-12 MED ORDER — BIOTIN 1000 MCG PO TABS: 1000.0000 ug | ORAL_TABLET | Freq: Every day | ORAL | Status: DC

## 2023-01-12 MED ORDER — IPRATROPIUM-ALBUTEROL 0.5-2.5 (3) MG/3ML IN SOLN
3.0000 mL | Freq: Once | RESPIRATORY_TRACT | Status: AC
  Administered 2023-01-12: 3 mL via RESPIRATORY_TRACT
  Filled 2023-01-12: qty 3

## 2023-01-12 MED ORDER — ACETAMINOPHEN 325 MG PO TABS
650.0000 mg | ORAL_TABLET | Freq: Four times a day (QID) | ORAL | Status: DC | PRN
  Administered 2023-01-12 – 2023-01-16 (×3): 650 mg via ORAL
  Filled 2023-01-12 (×3): qty 2

## 2023-01-12 MED ORDER — SODIUM CHLORIDE 0.9 % IV SOLN
2.0000 g | Freq: Once | INTRAVENOUS | Status: AC
  Administered 2023-01-12: 2 g via INTRAVENOUS

## 2023-01-12 MED ORDER — VITAMIN D 25 MCG (1000 UNIT) PO TABS
1000.0000 [IU] | ORAL_TABLET | Freq: Every day | ORAL | Status: DC
  Administered 2023-01-12 – 2023-01-21 (×10): 1000 [IU] via ORAL
  Filled 2023-01-12 (×10): qty 1

## 2023-01-12 MED ORDER — GUAIFENESIN-DM 100-10 MG/5ML PO SYRP: 5.0000 mL | ORAL_SOLUTION | ORAL | Status: DC | PRN

## 2023-01-12 MED ORDER — IPRATROPIUM-ALBUTEROL 0.5-2.5 (3) MG/3ML IN SOLN
RESPIRATORY_TRACT | Status: AC
  Administered 2023-01-12: 3 mL
  Filled 2023-01-12: qty 3

## 2023-01-12 MED ORDER — VENLAFAXINE HCL ER 75 MG PO CP24
75.0000 mg | ORAL_CAPSULE | Freq: Every day | ORAL | Status: DC
  Administered 2023-01-12 – 2023-01-20 (×9): 75 mg via ORAL
  Filled 2023-01-12 (×9): qty 1

## 2023-01-12 MED ORDER — SODIUM CHLORIDE 0.9 % IV SOLN
500.0000 mg | INTRAVENOUS | Status: DC
  Administered 2023-01-12: 500 mg via INTRAVENOUS
  Filled 2023-01-12: qty 5

## 2023-01-12 MED ORDER — METHYLPREDNISOLONE SODIUM SUCC 125 MG IJ SOLR
60.0000 mg | INTRAMUSCULAR | Status: DC
  Administered 2023-01-12 – 2023-01-16 (×5): 60 mg via INTRAVENOUS
  Filled 2023-01-12 (×5): qty 2

## 2023-01-12 MED ORDER — CYANOCOBALAMIN 1000 MCG/ML IJ SOLN: 1000.0000 ug | INTRAMUSCULAR | Status: DC

## 2023-01-12 MED ORDER — GUAIFENESIN-DM 100-10 MG/5ML PO SYRP
10.0000 mL | ORAL_SOLUTION | Freq: Three times a day (TID) | ORAL | Status: DC
  Administered 2023-01-12 – 2023-01-21 (×29): 10 mL via ORAL
  Filled 2023-01-12 (×30): qty 10

## 2023-01-12 NOTE — Hospital Course (Addendum)
Victoria Dean is a 71 y.o. female with medical history significant for breast cancer on letrozole, chronic anxiety/depression, hypertension, hyperlipidemia, GERD, severe morbid obesity, who initially presented to [facility] ED where she left AMA due to lengthy wait time.  Has been having progressively worsening shortness of breath, nonproductive cough, and palpitations for the last several days.  No reported subjective fevers or chills.   ED: She was tachycardic and tachypneic with new oxygen requirement.  BP 125/68, pulse 108, respiration rate 25, O2 saturation 92% on 4 L.   Lab work: WBC 7.7, hemoglobin 11.5, at baseline, platelet count 204.  Serum glucose 140.  Troponin 27, repeat 25  CT angio chest was negative for pulmonary embolism however showed bronchitis with patchy areas of bronchiolitis/bronchopneumonia in the bilateral lungs.  The patient was started on empiric IV antibiotic Rocephin and azithromycin.   Admitted by [facility], hospitalist service.  Assessment/Plan Present on Admission: Acute respiratory failure-hypoxia Multifocal pneumonia   Acute hypoxic respiratory failure secondary to the above -Significant shortness of breath, diffuse wheezing, rhonchi, pursed lip breathing this morning -Satting 100% on 4 L of oxygen -weaning off slowly  - Not on oxygen supplementation at baseline -at home -Scheduling DuoNeb bronchodilators, adding Pulmicort nebs -Continue IV steroids -Lasix 40 mg x 1 -Continue treating underlying pneumonia -Continue mucolytics, spirometer,   Multifocal pneumonia, POA -With acute respiratory failure, still requiring 4 L of oxygen this morning, shortness of breath with wheezing, rhonchi no crackles -CTA-negative for pulmonary embolism -Continue current antibiotics of IV Rocephin and azithromycin  - As needed bronchodilators - Antitussives as needed    Elevated troponin, suspect demand ischemia in the setting of acute hypoxic respiratory failure -Denies any chest  pain - High-sensitivity troponin peaked at 27 and downtrending. - Last 2D echo done on 06/29/2022 revealed LVEF 60 to 65% with no regional wall motion abnormalities. - Monitor on telemetry   Chronic anxiety/depression Resume home regimen. -Added as needed Xanax   Hyperlipidemia Continue statins   GERD Continue PPI   Generalized weakness PT OT assessment Fall precautions.   Hyperglycemia Last hemoglobin A1c 6.1 on 10/22/2021 Repeat hemoglobin A1c Start insulin sliding scale   Sinus tachycardia, suspect secondary to hypovolemia Monitoring, status post IV fluid resuscitation -Worsening tachycardia with breathing treatments, anxiety   Physical debility PT OT assessment Fall precautions.

## 2023-01-12 NOTE — ED Notes (Signed)
ED TO INPATIENT HANDOFF REPORT  ED Nurse Name and Phone #: .  S Name/Age/Gender Victoria Dean 72 y.o. female Room/Bed: APA18/APA18  Code Status   Code Status: Full Code  Home/SNF/Other Home Patient oriented to: self, place, time, and situation Is this baseline? Yes   Triage Complete: Triage complete  Chief Complaint CAP (community acquired pneumonia) [J18.9]  Triage Note Pt stated that she was seen at urgent care early yest   Pt stated that she was seen at urgent care early yesterday for SOB and nothing was done to help her so she went to [facility] where they did blood work and a CXR. Pt left AMA after having to wait. Pt stated taht she has not been diagnosed with CHF or COPD and this has never happened before. 3 neb tx on EMS. Currently on 4 L O2     Allergies Allergies  Allergen Reactions   Other Itching and Other (See Comments)    Brand name bandaids with cloth backing causes redness and itching   Statins     Muscle weakness   Stiolto Respimat [Tiotropium Bromide-Olodaterol]    Zetia [Ezetimibe]     Leg pain, upset stomach   Sulfa Antibiotics Rash    Level of Care/Admitting Diagnosis ED Disposition     ED Disposition  Admit   Condition  --   Comment  Hospital Area: [facility] [[id number]]  Level of Care: Telemetry [5]  Covid Evaluation: Asymptomatic - no recent exposure (last 10 days) testing not required  Diagnosis: CAP (community acquired pneumonia) [161096]  Admitting Physician: Darlin Drop [[id number]]  Attending Physician: Darlin Drop [[id number]]  Certification:: I certify this patient will need inpatient services for at least 2 midnights  Expected Medical Readiness: 01/14/2023          B Medical/Surgery History Past Medical History:  Diagnosis Date   Anemia    Breast cancer (HCC) 04/2020   left   Cataract    Dyspnea    Dysrhythmia    Emphysema of lung (HCC)    Family history of breast cancer    Family history of stomach  cancer    Hypercholesteremia    Hypertension    Major depressive disorder    Personal history of chemotherapy    Personal history of radiation therapy    PVC (premature ventricular contraction)    sporatic   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    Vitamin D deficiency    Past Surgical History:  Procedure Laterality Date   ABDOMINAL HYSTERECTOMY  1995   APPENDECTOMY  1994   BREAST BIOPSY Right 1998   neg   BREAST BIOPSY Left 05/30/2020   stere bx x clip path pending   BREAST LUMPECTOMY WITH SENTINEL LYMPH NODE BIOPSY Left 06/29/2020   Procedure: BREAST LUMPECTOMY WITH SENTINEL LYMPH NODE BX;  Surgeon: Earline Mayotte, MD;  Location: [facility] ORS;  Service: General;  Laterality: Left;   CATARACT EXTRACTION W/PHACO Left 01/31/2022   Procedure: CATARACT EXTRACTION PHACO AND INTRAOCULAR LENS PLACEMENT (IOC);  Surgeon: Fabio Pierce, MD;  Location: [facility];  Service: Ophthalmology;  Laterality: Left;  CDE 8.10   CATARACT EXTRACTION W/PHACO Right 02/21/2022   Procedure: CATARACT EXTRACTION PHACO AND INTRAOCULAR LENS PLACEMENT (IOC);  Surgeon: Fabio Pierce, MD;  Location: [facility];  Service: Ophthalmology;  Laterality: Right;  CDE: 9.42   ESI     LUMBAR MICRODISCECTOMY  2019  L3-4   LUMBAR MICRODISCECTOMY  2008   L4-L5 repeated   MICRODISCECTOMY LUMBAR  2008   L4-5   MICRODISCECTOMY LUMBAR  2010   L4-L5 repeated   NODE DISSECTION Left 08/13/2020   Procedure: AXILLARY NODE DISSECTION;  Surgeon: Earline Mayotte, MD;  Location: [facility] ORS;  Service: General;  Laterality: Left;   PORTACATH PLACEMENT Right 09/05/2020   Procedure: INSERTION PORT-A-CATH;  Surgeon: Earline Mayotte, MD;  Location: [facility] ORS;  Service: General;  Laterality: Right;  right; monitor anesthesia care   SQUAMOUS CELL CARCINOMA EXCISION     TONSILLECTOMY  1971     A IV Location/Drains/Wounds Patient Lines/Drains/Airways Status     Active Line/Drains/Airways      Name Placement date Placement time Site Days   Peripheral IV 01/12/23 18 G Anterior;Proximal;Right Forearm 01/12/23  0304  Forearm  less than 1   Closed System Drain 1 Left Other (Comment) Bulb (JP) 15 Fr. 08/13/20  1516  Other (Comment)  882            Intake/Output Last 24 hours No intake or output data in the 24 hours ending 01/12/23 0542  Labs/Imaging Results for orders placed or performed during the hospital encounter of 01/12/23 (from the past 48 hour(s))  CBC     Status: Abnormal   Collection Time: 01/12/23  3:00 AM  Result Value Ref Range   WBC 7.7 4.0 - 10.5 K/uL   RBC 4.03 3.87 - 5.11 MIL/uL   Hemoglobin 11.5 (L) 12.0 - 15.0 g/dL   HCT 14.7 (L) 82.9 - 56.2 %   MCV 88.6 80.0 - 100.0 fL   MCH 28.5 26.0 - 34.0 pg   MCHC 32.2 30.0 - 36.0 g/dL   RDW 13.0 (H) 86.5 - 78.4 %   Platelets 204 150 - 400 K/uL   nRBC 0.0 0.0 - 0.2 %    Comment: Performed at [facility], [address], [place], [place] [address]  Comprehensive metabolic panel     Status: Abnormal   Collection Time: 01/12/23  3:00 AM  Result Value Ref Range   Sodium 136 135 - 145 mmol/L   Potassium 3.7 3.5 - 5.1 mmol/L   Chloride 100 98 - 111 mmol/L   CO2 26 22 - 32 mmol/L   Glucose, Bld 140 (H) 70 - 99 mg/dL    Comment: Glucose reference range applies only to samples taken after fasting for at least 8 hours.   BUN 16 8 - 23 mg/dL   Creatinine, Ser 5.28 0.44 - 1.00 mg/dL   Calcium 9.1 8.9 - 41.3 mg/dL   Total Protein 7.0 6.5 - 8.1 g/dL   Albumin 3.7 3.5 - 5.0 g/dL   AST 21 15 - 41 U/L   ALT 28 0 - 44 U/L   Alkaline Phosphatase 113 38 - 126 U/L   Total Bilirubin 0.5 <1.2 mg/dL   GFR, Estimated >24 >40 mL/min    Comment: (NOTE) Calculated using the CKD-EPI Creatinine Equation (2021)    Anion gap 10 5 - 15    Comment: Performed at [facility], [address], [place], [place] [address]  Troponin I (High Sensitivity)     Status: Abnormal   Collection Time: 01/12/23  3:00 AM  Result Value Ref Range    Troponin I (High Sensitivity) 27 (H) <18 ng/L    Comment: (NOTE) Elevated high sensitivity troponin I (hsTnI) values and significant  changes across serial measurements may suggest ACS but many other  chronic and acute  conditions are known to elevate hsTnI results.  Refer to the "Links" section for chest pain algorithms and additional  guidance. Performed at [facility], [address] [place], [place] [address]   Brain natriuretic peptide     Status: None   Collection Time: 01/12/23  3:00 AM  Result Value Ref Range   B Natriuretic Peptide 65.0 0.0 - 100.0 pg/mL    Comment: Performed at [facility], [address] [place], [place] [address]  Troponin I (High Sensitivity)     Status: Abnormal   Collection Time: 01/12/23  4:19 AM  Result Value Ref Range   Troponin I (High Sensitivity) 25 (H) <18 ng/L    Comment: (NOTE) Elevated high sensitivity troponin I (hsTnI) values and significant  changes across serial measurements may suggest ACS but many other  chronic and acute conditions are known to elevate hsTnI results.  Refer to the "Links" section for chest pain algorithms and additional  guidance. Performed at [facility], [address] [place], [place] [address]    CT Angio Chest Pulmonary Embolism (PE) W or WO Contrast  Result Date: 01/12/2023 CLINICAL DATA:  Pulmonary embolism suspected.  Shortness of breath. EXAM: CT ANGIOGRAPHY CHEST WITH CONTRAST TECHNIQUE: Multidetector CT imaging of the chest was performed using the standard protocol during bolus administration of intravenous contrast. Multiplanar CT image reconstructions and MIPs were obtained to evaluate the vascular anatomy. RADIATION DOSE REDUCTION: This exam was performed according to the departmental dose-optimization program which includes automated exposure control, adjustment of the mA and/or kV according to patient size and/or use of iterative reconstruction technique. CONTRAST:  OMNIPAQUE IOHEXOL 350 MG/ML SOLN  COMPARISON:  01/09/2021 FINDINGS: Cardiovascular: Suboptimal although satisfactory opacification of the pulmonary arteries to the segmental level. There is further intermittent limitation from motion artifact. No evidence of pulmonary embolism. Normal heart size. No pericardial effusion. Extensive coronary atheromatous calcification. Mediastinum/Nodes: Negative for mass or adenopathy. Lungs/Pleura: Airway thickening with areas of patchy clustered nodularity in the right more than left lung. No lobar consolidation, edema, effusion, or pneumothorax. Upper Abdomen: Negative Musculoskeletal: Generalized spondylitic spurring and primarily upper lumbar disc space narrowing. Prominent posterior spur the midthoracic spine. Other: Postoperative left breast. Review of the MIP images confirms the above findings. IMPRESSION: 1. Bronchitis with patchy areas of bronchiolitis/bronchopneumonia in the bilateral lungs. 2. Motion degraded chest CTA with no evidence of pulmonary embolism. 3. Atherosclerosis, including the coronary arteries. Electronically Signed   By: Tiburcio Pea M.D.   On: 01/12/2023 04:27   DG Chest 2 View  Result Date: 01/11/2023 CLINICAL DATA:  Shortness of breath with chest pain. EXAM: CHEST - 2 VIEW COMPARISON:  Radiographs 06/28/2022 and 01/19/2021.  CT 01/19/2021. FINDINGS: The heart size and mediastinal contours are normal. The lungs are clear. There is no pleural effusion or pneumothorax. No acute osseous findings are identified. Postsurgical changes in the left breast and left axilla. Degenerative changes throughout the thoracic spine. IMPRESSION: No evidence of acute cardiopulmonary process. Electronically Signed   By: Carey Bullocks M.D.   On: 01/11/2023 14:51    Pending Labs Unresulted Labs (From admission, onward)     Start     Ordered   01/19/23 0500  Creatinine, serum  (enoxaparin (LOVENOX)    CrCl >/= 30 ml/min)  Weekly,   R     Comments: while on enoxaparin therapy    01/12/23 0509             Vitals/Pain Today's Vitals   01/12/23 0230 01/12/23  2130 01/12/23 0318 01/12/23 0431  BP: 125/68     Pulse: (!) 103  (!) 101   Resp: (!) 22  (!) 23   Temp:      TempSrc:      SpO2: 93% 99% 99% 99%  Weight:      Height:      PainSc:        Isolation Precautions No active isolations  Medications Medications  cefTRIAXone (ROCEPHIN) 2 g in sodium chloride 0.9 % 100 mL IVPB (has no administration in time range)  azithromycin (ZITHROMAX) 500 mg in sodium chloride 0.9 % 250 mL IVPB (500 mg Intravenous New Bag/Given 01/12/23 0454)  enoxaparin (LOVENOX) injection 40 mg (has no administration in time range)  venlafaxine XR (EFFEXOR-XR) 24 hr capsule 225 mg (has no administration in time range)  rosuvastatin (CRESTOR) tablet 5 mg (has no administration in time range)  famotidine (PEPCID) tablet 20 mg (has no administration in time range)  cyanocobalamin (VITAMIN B12) injection 1,000 mcg (has no administration in time range)  cholecalciferol (VITAMIN D3) 25 MCG (1000 UNIT) tablet 1,000 Units (has no administration in time range)  aspirin EC tablet 81 mg (has no administration in time range)  ipratropium-albuterol (DUONEB) 0.5-2.5 (3) MG/3ML nebulizer solution 3 mL (3 mLs Nebulization Given 01/12/23 0316)  methylPREDNISolone sodium succinate (SOLU-MEDROL) 125 mg/2 mL injection 125 mg (125 mg Intravenous Given 01/12/23 0311)  ipratropium-albuterol (DUONEB) 0.5-2.5 (3) MG/3ML nebulizer solution 3 mL (3 mLs Nebulization Given 01/12/23 0316)  iohexol (OMNIPAQUE) 350 MG/ML injection 100 mL (100 mLs Intravenous Contrast Given 01/12/23 0401)  ipratropium-albuterol (DUONEB) 0.5-2.5 (3) MG/3ML nebulizer solution (3 mLs  Given 01/12/23 0433)    Mobility Walks with cane     Focused Assessments Cardiac Assessment Handoff:    No results found for: "CKTOTAL", "CKMB", "CKMBINDEX", "TROPONINI" Lab Results  Component Value Date   DDIMER <0.27 06/28/2022   Does the Patient currently  have chest pain? No   , Pulmonary Assessment Handoff:  Lung sounds: Bilateral Breath Sounds: Diminished, Expiratory wheezes L Breath Sounds: Expiratory wheezes R Breath Sounds: Expiratory wheezes O2 Device: Bi-PAP O2 Flow Rate (L/min): 4 L/min    R Recommendations: See Admitting Provider Note  Report given to:   Additional Notes: .

## 2023-01-12 NOTE — Progress Notes (Signed)
PROGRESS NOTE    Patient: Victoria Dean                            PCP: Oneal Grout, FNP                    DOB: 30-Jun-1950            DOA: 01/12/2023 [id number]             DOS: 01/12/2023, 10:45 AM   LOS: 0 days   Date of Service: The patient was seen and examined on 01/12/2023  Subjective:   The patient was seen and examined this morning. Hemodynamically stable. Still requiring 4 L of oxygen, satting 95% Complaint shortness of breath at rest, worsening with minimal exertion Complaining of cough congestions    Brief Narrative:   HALEIGHA PICARDO is a 72 y.o. female with medical history significant for breast cancer on letrozole, chronic anxiety/depression, hypertension, hyperlipidemia, GERD, severe morbid obesity, who initially presented to [facility] ED where she left AMA due to lengthy wait time.  Has been having progressively worsening shortness of breath, nonproductive cough, and palpitations for the last several days.  No reported subjective fevers or chills.   ED: She was tachycardic and tachypneic with new oxygen requirement.  BP 125/68, pulse 108, respiration rate 25, O2 saturation 92% on 4 L.   Lab work: WBC 7.7, hemoglobin 11.5, at baseline, platelet count 204.  Serum glucose 140.  Troponin 27, repeat 25  CT angio chest was negative for pulmonary embolism however showed bronchitis with patchy areas of bronchiolitis/bronchopneumonia in the bilateral lungs.  The patient was started on empiric IV antibiotic Rocephin and azithromycin.   Admitted by [facility], hospitalist service.  Assessment/Plan Present on Admission: Acute respiratory failure-hypoxia Multifocal pneumonia    Multifocal pneumonia, POA -With acute respiratory failure, still requiring 4 L of oxygen this morning, shortness of breath with wheezing, rhonchi no crackles -CTA-negative for pulmonary embolism -Continue current antibiotics of IV Rocephin and azithromycin  - As needed bronchodilators - Antitussives as  needed   Acute hypoxic respiratory failure secondary to the above - Not on oxygen supplementation at baseline - Currently requiring 4 L to maintain O2 saturation above 92%>> improved to 95% - Wean off oxygen supplementation as tolerated. - Early mobilization. -Continue DuoNeb bronchodilators   Elevated troponin, suspect demand ischemia in the setting of acute hypoxic respiratory failure -Denies any chest pain - High-sensitivity troponin peaked at 27 and downtrending. - Last 2D echo done on 06/29/2022 revealed LVEF 60 to 65% with no regional wall motion abnormalities. - Monitor on telemetry   Chronic anxiety/depression Resume home regimen.   Hyperlipidemia Resume home regimen.   GERD Resume home H2 blocker.   Generalized weakness PT OT assessment Fall precautions.   Hyperglycemia Last hemoglobin A1c 6.1 on 10/22/2021 Repeat hemoglobin A1c Start insulin sliding scale   Sinus tachycardia, suspect secondary to hypovolemia Hypovolemic on exam NS at 40 cc/h x 1 day.   Physical debility PT OT assessment Fall precautions.     --------------------------------------------------------------------------------------------------------------------------------------------- Nutritional status:  The patient's BMI is: Body mass index is 43.4 kg/m. I agree with the assessment and plan as outlined -----------------------------------------------------------------------------------------------------------------------------------------  DVT prophylaxis:  SCD   Code Status:   Code Status: Full Code  Family Communication: No family member present at bedside- -Advance care planning has been discussed.   Admission status:   Status is: Inpatient Remains inpatient appropriate because:  Needing breathing treatment, respiratory support, nebs, IV antibiotics   Disposition: From  - home             Planning for discharge in 1-2 days: to   Procedures:   No admission procedures for  hospital encounter.   Antimicrobials:  Anti-infectives (From admission, onward)    Start     Dose/Rate Route Frequency Ordered Stop   01/13/23 0600  cefTRIAXone (ROCEPHIN) 2 g in sodium chloride 0.9 % 100 mL IVPB        2 g 200 mL/hr over 30 Minutes Intravenous Every 24 hours 01/12/23 0632     01/13/23 0500  azithromycin (ZITHROMAX) 500 mg in sodium chloride 0.9 % 250 mL IVPB  Status:  Discontinued        500 mg 250 mL/hr over 60 Minutes Intravenous Every 24 hours 01/12/23 0632 01/12/23 0704   01/12/23 1000  azithromycin (ZITHROMAX) tablet 500 mg        500 mg Oral Daily 01/12/23 0704     01/12/23 0445  cefTRIAXone (ROCEPHIN) 2 g in sodium chloride 0.9 % 100 mL IVPB        2 g 200 mL/hr over 30 Minutes Intravenous  Once 01/12/23 0439 01/12/23 0641   01/12/23 0445  azithromycin (ZITHROMAX) 500 mg in sodium chloride 0.9 % 250 mL IVPB  Status:  Discontinued        500 mg 250 mL/hr over 60 Minutes Intravenous Every 24 hours 01/12/23 0439 01/12/23 8295        Medication:   aspirin EC  81 mg Oral Daily   azithromycin  500 mg Oral Daily   cholecalciferol  1,000 Units Oral Daily   [START ON 02/06/2023] cyanocobalamin  1,000 mcg Intramuscular Q30 days   famotidine  20 mg Oral Daily   guaiFENesin-dextromethorphan  10 mL Oral Q8H   insulin aspart  0-5 Units Subcutaneous QHS   insulin aspart  0-9 Units Subcutaneous TID WC   ipratropium  0.5 mg Nebulization Q6H   letrozole  2.5 mg Oral Daily   levalbuterol  1.25 mg Nebulization Q6H   methylPREDNISolone sodium succinate  60 mg Intravenous Q24H   rosuvastatin  5 mg Oral Daily   venlafaxine XR  75 mg Oral QHS    acetaminophen, melatonin, polyethylene glycol, prochlorperazine   Objective:   Vitals:   01/12/23 0540 01/12/23 0600 01/12/23 0634 01/12/23 0824  BP: 107/63 (!) 120/58 138/65   Pulse: (!) 102 (!) 101 (!) 104   Resp: (!) 22 (!) 21 20   Temp: 98.7 F (37.1 C)  99.2 F (37.3 C)   TempSrc: Oral  Oral   SpO2: 92% 92% 93% 95%   Weight:      Height:       No intake or output data in the 24 hours ending 01/12/23 1045 Filed Weights   01/12/23 0217  Weight: 111.1 kg     Physical examination:   Constitution:  Alert, cooperative, no distress,  Appears calm and comfortable  Psychiatric:   Normal and stable mood and affect, cognition intact,   HEENT:        Normocephalic, PERRL, otherwise with in Normal limits  Chest:         Chest symmetric Cardio vascular:  S1/S2, RRR, No murmure, No Rubs or Gallops  pulmonary: + BS on auscultation bilaterally, respirations unlabored, positive rhonchi, + wheezes / NO crackles Abdomen: Soft, non-tender, non-distended, bowel sounds,no masses, no organomegaly Muscular skeletal: Limited exam - in bed, able to  move all 4 extremities,   Neuro: CNII-XII intact. , normal motor and sensation, reflexes intact  Extremities: No pitting edema lower extremities, +2 pulses  Skin: Dry, warm to touch, negative for any Rashes, No open wounds Wounds: per nursing documentation   ------------------------------------------------------------------------------------------------------------------------------------------    LABs:     Latest Ref Rng & Units 01/12/2023    3:00 AM 01/11/2023    2:26 PM 10/22/2022    9:16 AM  CBC  WBC 4.0 - 10.5 K/uL 7.7  7.1  6.8   Hemoglobin 12.0 - 15.0 g/dL 56.2  13.0  86.5   Hematocrit 36.0 - 46.0 % 35.7  37.4  35.3   Platelets 150 - 400 K/uL 204  222  241       Latest Ref Rng & Units 01/12/2023    3:00 AM 01/11/2023    2:26 PM 10/22/2022    9:16 AM  CMP  Glucose 70 - 99 mg/dL 784  696  295   BUN 8 - 23 mg/dL 16  17  20    Creatinine 0.44 - 1.00 mg/dL 2.84  1.32  4.40   Sodium 135 - 145 mmol/L 136  133  136   Potassium 3.5 - 5.1 mmol/L 3.7  3.6  3.7   Chloride 98 - 111 mmol/L 100  98  103   CO2 22 - 32 mmol/L 26  26  27    Calcium 8.9 - 10.3 mg/dL 9.1  8.7  9.0   Total Protein 6.5 - 8.1 g/dL 7.0   6.8   Total Bilirubin <1.2 mg/dL 0.5   0.3   Alkaline  Phos 38 - 126 U/L 113   112   AST 15 - 41 U/L 21   18   ALT 0 - 44 U/L 28   24        Micro Results No results found for this or any previous visit (from the past 240 hour(s)).  Radiology Reports CT Angio Chest Pulmonary Embolism (PE) W or WO Contrast  Result Date: 01/12/2023 CLINICAL DATA:  Pulmonary embolism suspected.  Shortness of breath. EXAM: CT ANGIOGRAPHY CHEST WITH CONTRAST TECHNIQUE: Multidetector CT imaging of the chest was performed using the standard protocol during bolus administration of intravenous contrast. Multiplanar CT image reconstructions and MIPs were obtained to evaluate the vascular anatomy. RADIATION DOSE REDUCTION: This exam was performed according to the departmental dose-optimization program which includes automated exposure control, adjustment of the mA and/or kV according to patient size and/or use of iterative reconstruction technique. CONTRAST:  OMNIPAQUE IOHEXOL 350 MG/ML SOLN COMPARISON:  01/09/2021 FINDINGS: Cardiovascular: Suboptimal although satisfactory opacification of the pulmonary arteries to the segmental level. There is further intermittent limitation from motion artifact. No evidence of pulmonary embolism. Normal heart size. No pericardial effusion. Extensive coronary atheromatous calcification. Mediastinum/Nodes: Negative for mass or adenopathy. Lungs/Pleura: Airway thickening with areas of patchy clustered nodularity in the right more than left lung. No lobar consolidation, edema, effusion, or pneumothorax. Upper Abdomen: Negative Musculoskeletal: Generalized spondylitic spurring and primarily upper lumbar disc space narrowing. Prominent posterior spur the midthoracic spine. Other: Postoperative left breast. Review of the MIP images confirms the above findings. IMPRESSION: 1. Bronchitis with patchy areas of bronchiolitis/bronchopneumonia in the bilateral lungs. 2. Motion degraded chest CTA with no evidence of pulmonary embolism. 3. Atherosclerosis,  including the coronary arteries. Electronically Signed   By: Tiburcio Pea M.D.   On: 01/12/2023 04:27   DG Chest 2 View  Result Date: 01/11/2023 CLINICAL DATA:  Shortness of breath  with chest pain. EXAM: CHEST - 2 VIEW COMPARISON:  Radiographs 06/28/2022 and 01/19/2021.  CT 01/19/2021. FINDINGS: The heart size and mediastinal contours are normal. The lungs are clear. There is no pleural effusion or pneumothorax. No acute osseous findings are identified. Postsurgical changes in the left breast and left axilla. Degenerative changes throughout the thoracic spine. IMPRESSION: No evidence of acute cardiopulmonary process. Electronically Signed   By: Carey Bullocks M.D.   On: 01/11/2023 14:51    SIGNED: Kendell Bane, MD, FHM. FAAFP. [facility] - [facility] Time spent - 55 min.  In seeing, evaluating and examining the patient. Reviewing medical records, labs, drawn plan of care. [facility],  Pager (please use [email or web address] to page/ text) Please use Epic Secure Chat for non-urgent communication (7AM-7PM)  If 7PM-7AM, please contact night-coverage [email or web address], 01/12/2023, 10:45 AM

## 2023-01-12 NOTE — Evaluation (Signed)
Physical Therapy Evaluation Patient Details Name: Victoria Dean MRN: [id number] DOB: 03-02-1950 Today's Date: 01/12/2023  History of Present Illness  Victoria Dean is a 72 y.o. female with medical history significant for breast cancer on letrozole, chronic anxiety/depression, hypertension, hyperlipidemia, GERD, severe morbid obesity, who initially presented to [facility] ED where she left AMA due to lengthy wait time.  Has been having progressively worsening shortness of breath, nonproductive cough, and palpitations for the last several days.  No reported subjective fevers or chills.     In the ED, tachycardic and tachypneic with new oxygen requirement.  BP 125/68, pulse 108, respiration rate 25, O2 saturation 92% on 4 L.       CT angio chest was negative for pulmonary embolism however showed bronchitis with patchy areas of bronchiolitis/bronchopneumonia in the bilateral lungs.  The patient was started on empiric IV antibiotic Rocephin and azithromycin.  Admitted by [facility], hospitalist service.   Clinical Impression  Patient demonstrates slow labored movement for functional mobility, has to lean on wall, nearby objects for support when ambulating using SPC, very SOB and limited for gait training mostly due to fatigue.  Patient tolerated sitting up in chair after therapy.  Patient will benefit from continued skilled physical therapy in hospital and recommended venue below to increase strength, balance, endurance for safe ADLs and gait.          If plan is discharge home, recommend the following: A lot of help with walking and/or transfers;A little help with bathing/dressing/bathroom;Help with stairs or ramp for entrance;Assistance with cooking/housework   Can travel by private vehicle   Yes    Equipment Recommendations Rolling walker (2 wheels)  Recommendations for Other Services       Functional Status Assessment Patient has had a recent decline in their functional status and demonstrates the ability  to make significant improvements in function in a reasonable and predictable amount of time.     Precautions / Restrictions Precautions Precautions: Fall Restrictions Weight Bearing Restrictions: No      Mobility  Bed Mobility Overal bed mobility: Needs Assistance Bed Mobility: Supine to Sit     Supine to sit: HOB elevated, Contact guard     General bed mobility comments: increased time, labored movement    Transfers Overall transfer level: Needs assistance Equipment used: Straight cane Transfers: Sit to/from Stand, Bed to chair/wheelchair/BSC Sit to Stand: Contact guard assist, Min assist   Step pivot transfers: Contact guard assist, Min assist       General transfer comment: slow labored movement having to lean on nearby objects for support    Ambulation/Gait Ambulation/Gait assistance: Min assist Gait Distance (Feet): 16 Feet Assistive device: Straight cane Gait Pattern/deviations: Decreased step length - right, Decreased step length - left, Step-to pattern, Decreased stride length Gait velocity: slow     General Gait Details: slow labored cadence frequently having to lean on nearby objects/wall for support, on 3 LPM O2 with SpO2 at 91% and limited mostly due to c/o fatigue and weakness  Stairs            Wheelchair Mobility     Tilt Bed    Modified Rankin (Stroke Patients Only)       Balance Overall balance assessment: Needs assistance Sitting-balance support: Feet supported, No upper extremity supported Sitting balance-Leahy Scale: Fair Sitting balance - Comments: fair/good seated at EOB   Standing balance support: During functional activity, Single extremity supported Standing balance-Leahy Scale: Poor Standing balance comment: fair/poor using SPC  Pertinent Vitals/Pain Pain Assessment Pain Assessment: No/denies pain    Home Living Family/patient expects to be discharged to:: Private  residence Living Arrangements: Alone Available Help at Discharge: Family;Available PRN/intermittently Type of Home: House Home Access: Stairs to enter Entrance Stairs-Rails: None Entrance Stairs-Number of Steps: 2   Home Layout: One level Home Equipment: Cane - single point      Prior Function Prior Level of Function : Independent/Modified Independent             Mobility Comments: Household and short distanced community ambulator ADLs Comments: Independent     Extremity/Trunk Assessment   Upper Extremity Assessment Upper Extremity Assessment: Generalized weakness    Lower Extremity Assessment Lower Extremity Assessment: Generalized weakness    Cervical / Trunk Assessment Cervical / Trunk Assessment: Normal  Communication   Communication Communication: No apparent difficulties  Cognition Arousal: Alert Behavior During Therapy: WFL for tasks assessed/performed Overall Cognitive Status: Within Functional Limits for tasks assessed                                          General Comments      Exercises     Assessment/Plan    PT Assessment Patient needs continued PT services  PT Problem List Decreased strength;Decreased activity tolerance;Decreased balance;Decreased mobility       PT Treatment Interventions DME instruction;Gait training;Stair training;Functional mobility training;Therapeutic activities;Therapeutic exercise;Balance training;Patient/family education    PT Goals (Current goals can be found in the Care Plan section)  Acute Rehab PT Goals Patient Stated Goal: return home after rehab PT Goal Formulation: With patient Time For Goal Achievement: 01/26/23 Potential to Achieve Goals: Good    Frequency Min 3X/week     Co-evaluation               AM-PAC PT "6 Clicks" Mobility  Outcome Measure Help needed turning from your back to your side while in a flat bed without using bedrails?: None Help needed moving from lying  on your back to sitting on the side of a flat bed without using bedrails?: A Little Help needed moving to and from a bed to a chair (including a wheelchair)?: A Little Help needed standing up from a chair using your arms (e.g., wheelchair or bedside chair)?: A Little Help needed to walk in hospital room?: A Lot Help needed climbing 3-5 steps with a railing? : A Lot 6 Click Score: 17    End of Session Equipment Utilized During Treatment: Oxygen Activity Tolerance: Patient tolerated treatment well;Patient limited by fatigue Patient left: in chair;with call bell/phone within reach Nurse Communication: Mobility status PT Visit Diagnosis: Unsteadiness on feet (R26.81);Other abnormalities of gait and mobility (R26.89);Muscle weakness (generalized) (M62.81)    Time: 4782-9562 PT Time Calculation (min) (ACUTE ONLY): 23 min   Charges:   PT Evaluation $PT Eval Moderate Complexity: 1 Mod PT Treatments $Therapeutic Activity: 23-37 mins PT General Charges $$ ACUTE PT VISIT: 1 Visit         12:30 PM, 01/12/23 Ocie Bob, MPT Physical Therapist with [facility] [phone] office [phone] mobile phone

## 2023-01-12 NOTE — ED Provider Notes (Signed)
[facility]-EMERGENCY DEPT [facility] Emergency Department Provider Note MRN:  [id number]  Arrival date & time: 01/12/23     Chief Complaint   Shortness of Breath   History of Present Illness   Victoria Dean is a 72 y.o. year-old female with a history of breast cancer, emphysema presenting to the ED with chief complaint of shortness of breath.  Worsening shortness of breath over the past few days.  Went to [facility] and was put in the waiting room for 4 hours despite being on supplemental oxygen.  Left AGAINST MEDICAL ADVICE, now here.  Mild chest tightness, no fever, persistent cough recently.  Review of Systems  A thorough review of systems was obtained and all systems are negative except as noted in the HPI and PMH.   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    Vitamin D deficiency     Past Surgical History:  Procedure Laterality Date   ABDOMINAL HYSTERECTOMY  1995   APPENDECTOMY  1994   BREAST BIOPSY Right 1998   neg   BREAST BIOPSY Left 05/30/2020   stere bx x clip path pending   BREAST LUMPECTOMY WITH SENTINEL LYMPH NODE BIOPSY Left 06/29/2020   Procedure: BREAST LUMPECTOMY WITH SENTINEL LYMPH NODE BX;  Surgeon: Earline Mayotte, MD;  Location: [facility] ORS;  Service: General;  Laterality: Left;   CATARACT EXTRACTION W/PHACO Left 01/31/2022   Procedure: CATARACT EXTRACTION PHACO AND INTRAOCULAR LENS PLACEMENT (IOC);  Surgeon: Fabio Pierce, MD;  Location: [facility] ORS;   Service: Ophthalmology;  Laterality: Left;  CDE 8.10   CATARACT EXTRACTION W/PHACO Right 02/21/2022   Procedure: CATARACT EXTRACTION PHACO AND INTRAOCULAR LENS PLACEMENT (IOC);  Surgeon: Fabio Pierce, MD;  Location: [facility] ORS;  Service: Ophthalmology;  Laterality: Right;  CDE: 9.42   ESI     LUMBAR MICRODISCECTOMY  2019   L3-4   LUMBAR MICRODISCECTOMY  2008   L4-L5 repeated   MICRODISCECTOMY LUMBAR  2008   L4-5   MICRODISCECTOMY LUMBAR  2010   L4-L5 repeated   NODE DISSECTION Left 08/13/2020   Procedure: AXILLARY NODE DISSECTION;  Surgeon: Earline Mayotte, MD;  Location: [facility] ORS;  Service: General;  Laterality: Left;   PORTACATH PLACEMENT Right 09/05/2020   Procedure: INSERTION PORT-A-CATH;  Surgeon: Earline Mayotte, MD;  Location: [facility] ORS;  Service: General;  Laterality: Right;  right; monitor anesthesia care   SQUAMOUS CELL CARCINOMA EXCISION     TONSILLECTOMY  1971    Family History  Problem Relation Age of Onset   Breast cancer Mother 13   Osteosarcoma Mother        d. 63   Hypertension Brother    COPD Father    Hypertension Father    Breast cancer Paternal Aunt        dx 58s   Breast cancer Paternal Grandmother  dx 82s   Stomach cancer Paternal Grandfather        dx 55s   Breast cancer Paternal Aunt        dx 39s   Brain cancer Paternal Aunt        dx 83s    Social History   Socioeconomic History   Marital status: Widowed    Spouse name: Not on file   Number of children: 1   Years of education: Not on file   Highest education level: Bachelor's degree (e.g., BA, AB, BS)  Occupational History   Occupation: retired  Tobacco Use   Smoking status: Former    Current packs/day: 0.00    Average packs/day: 0.9 packs/day for 33.0 years (30.0 ttl pk-yrs)    Types: Cigarettes    Start date: 04/18/1985    Quit date: 04/19/2018    Years since quitting: 4.7   Smokeless tobacco: Never  Vaping Use   Vaping status: Never Used  Substance and Sexual Activity    Alcohol use: Yes    Comment: rarely   Drug use: No   Sexual activity: Not Currently  Other Topics Concern   Not on file  Social History Narrative   Not on file   Social Determinants of Health   Financial Resource Strain: Low Risk  (10/23/2021)   Overall Financial Resource Strain (CARDIA)    Difficulty of Paying Living Expenses: Not hard at all  Food Insecurity: No Food Insecurity (01/12/2023)   Hunger Vital Sign    Worried About Running Out of Food in the Last Year: Never true    Ran Out of Food in the Last Year: Never true  Transportation Needs: No Transportation Needs (01/12/2023)   PRAPARE - Administrator, Civil Service (Medical): No    Lack of Transportation (Non-Medical): No  Physical Activity: Inactive (02/16/2018)   Exercise Vital Sign    Days of Exercise per Week: 0 days    Minutes of Exercise per Session: 0 min  Stress: No Stress Concern Present (02/16/2018)   [employer] of Occupational Health - Occupational Stress Questionnaire    Feeling of Stress : Only a little  Social Connections: Moderately Isolated (10/23/2021)   Social Connection and Isolation Panel [NHANES]    Frequency of Communication with Friends and Family: More than three times a week    Frequency of Social Gatherings with Friends and Family: More than three times a week    Attends Religious Services: More than 4 times per year    Active Member of [employer] or Organizations: No    Attends Banker Meetings: Never    Marital Status: Widowed  Intimate Partner Violence: Not At Risk (01/12/2023)   Humiliation, Afraid, Rape, and Kick questionnaire    Fear of Current or Ex-Partner: No    Emotionally Abused: No    Physically Abused: No    Sexually Abused: No     Physical Exam   Vitals:   01/12/23 0600 01/12/23 0634  BP: (!) 120/58 138/65  Pulse: (!) 101 (!) 104  Resp: (!) 21 20  Temp:  99.2 F (37.3 C)  SpO2: 92% 93%    CONSTITUTIONAL: Well-appearing, moderate respiratory  distress NEURO/PSYCH:  Alert and oriented x 3, no focal deficits EYES:  eyes equal and reactive ENT/NECK:  no LAD, no JVD CARDIO: Regular rate, well-perfused, normal S1 and S2 PULM: Tachypneic, pursed lip breathing, 5 word dyspnea GI/GU:  non-distended, non-tender MSK/SPINE:  No gross deformities, no edema SKIN:  no rash, atraumatic   *Additional and/or pertinent findings included in MDM below  Diagnostic and Interventional Summary    EKG Interpretation Date/Time:  Monday January 12 2023 02:31:17 EST Ventricular Rate:  108 PR Interval:  161 QRS Duration:  75 QT Interval:  341 QTC Calculation: 457 R Axis:   80  Text Interpretation: Sinus tachycardia Confirmed by Kennis Carina [phone]) on 01/12/2023 7:26:11 AM       Labs Reviewed  CBC - Abnormal; Notable for the following components:      Result Value   Hemoglobin 11.5 (*)    HCT 35.7 (*)    RDW 15.8 (*)    All other components within normal limits  COMPREHENSIVE METABOLIC PANEL - Abnormal; Notable for the following components:   Glucose, Bld 140 (*)    All other components within normal limits  TROPONIN I (HIGH SENSITIVITY) - Abnormal; Notable for the following components:   Troponin I (High Sensitivity) 27 (*)    All other components within normal limits  TROPONIN I (HIGH SENSITIVITY) - Abnormal; Notable for the following components:   Troponin I (High Sensitivity) 25 (*)    All other components within normal limits  BRAIN NATRIURETIC PEPTIDE  PROCALCITONIN    CT Angio Chest Pulmonary Embolism (PE) W or WO Contrast  Final Result      Medications  venlafaxine XR (EFFEXOR-XR) 24 hr capsule 75 mg (has no administration in time range)  rosuvastatin (CRESTOR) tablet 5 mg (has no administration in time range)  famotidine (PEPCID) tablet 20 mg (has no administration in time range)  cyanocobalamin (VITAMIN B12) injection 1,000 mcg (has no administration in time range)  cholecalciferol (VITAMIN D3) 25 MCG (1000 UNIT)  tablet 1,000 Units (has no administration in time range)  aspirin EC tablet 81 mg (has no administration in time range)  letrozole [facility]) tablet 2.5 mg (has no administration in time range)  acetaminophen (TYLENOL) tablet 650 mg (has no administration in time range)  polyethylene glycol (MIRALAX / GLYCOLAX) packet 17 g (has no administration in time range)  prochlorperazine (COMPAZINE) injection 5 mg (has no administration in time range)  melatonin tablet 5 mg (has no administration in time range)  cefTRIAXone (ROCEPHIN) 2 g in sodium chloride 0.9 % 100 mL IVPB (has no administration in time range)  insulin aspart (novoLOG) injection 0-9 Units (has no administration in time range)  insulin aspart (novoLOG) injection 0-5 Units (has no administration in time range)  0.9 %  sodium chloride infusion (has no administration in time range)  azithromycin (ZITHROMAX) tablet 500 mg (has no administration in time range)  methylPREDNISolone sodium succinate (SOLU-MEDROL) 125 mg/2 mL injection 60 mg (has no administration in time range)  guaiFENesin-dextromethorphan (ROBITUSSIN DM) 100-10 MG/5ML syrup 10 mL (has no administration in time range)  ipratropium (ATROVENT) nebulizer solution 0.5 mg (has no administration in time range)  levalbuterol (XOPENEX) nebulizer solution 1.25 mg (has no administration in time range)  ipratropium-albuterol (DUONEB) 0.5-2.5 (3) MG/3ML nebulizer solution 3 mL (3 mLs Nebulization Given 01/12/23 0316)  methylPREDNISolone sodium succinate (SOLU-MEDROL) 125 mg/2 mL injection 125 mg (125 mg Intravenous Given 01/12/23 0311)  ipratropium-albuterol (DUONEB) 0.5-2.5 (3) MG/3ML nebulizer solution 3 mL (3 mLs Nebulization Given 01/12/23 0316)  iohexol (OMNIPAQUE) 350 MG/ML injection 100 mL (100 mLs Intravenous Contrast Given 01/12/23 0401)  ipratropium-albuterol (DUONEB) 0.5-2.5 (3) MG/3ML nebulizer solution (3 mLs  Given 01/12/23 0433)  cefTRIAXone (ROCEPHIN) 2 g in sodium chloride 0.9 %  100 mL IVPB (0 g Intravenous Stopped 01/12/23  2841)     Procedures  /  Critical Care .Critical Care  Performed by: Sabas Sous, MD Authorized by: Sabas Sous, MD   Critical care provider statement:    Critical care time (minutes):  35   Critical care was necessary to treat or prevent imminent or life-threatening deterioration of the following conditions:  Respiratory failure   Critical care was time spent personally by me on the following activities:  Development of treatment plan with patient or surrogate, discussions with consultants, evaluation of patient's response to treatment, examination of patient, ordering and review of laboratory studies, ordering and review of radiographic studies, ordering and performing treatments and interventions, pulse oximetry, re-evaluation of patient's condition and review of old charts   ED Course and Medical Decision Making  Initial Impression and Ddx Hypoxic respiratory failure with significant respiratory distress, patient placed on BiPAP.  Question CHF versus COPD versus pneumonia versus PE.  Past medical/surgical history that increases complexity of ED encounter: History of emphysema, history of breast cancer  Interpretation of Diagnostics I personally reviewed the EKG and my interpretation is as follows: Sinus rhythm  Sinus tachycardia  Patient Reassessment and Ultimate Disposition/Management     CT reveals pneumonia, admitted to medicine for further care.  On reassessment after BiPAP patient doing much better.  Patient management required discussion with the following services or consulting groups:  Hospitalist Service  Complexity of Problems Addressed Acute illness or injury that poses threat of life of bodily function  Additional Data Reviewed and Analyzed Further history obtained from: Further history from spouse/family member  Additional Factors Impacting ED Encounter Risk Consideration of hospitalization  Elmer Sow.  Pilar Plate, MD [facility] Emergency Medicine [facility] [email or web address]  Final Clinical Impressions(s) / ED Diagnoses     ICD-10-CM   1. Acute respiratory failure with hypoxia (HCC)  J96.01     2. Community acquired pneumonia, unspecified laterality  J18.9       ED Discharge Orders     None        Discharge Instructions Discussed with and Provided to Patient:   Discharge Instructions   None      Sabas Sous, MD 01/12/23 [phone]

## 2023-01-12 NOTE — Progress Notes (Signed)
Patient off BiPAP , transferred to 2nd floor. On 4 lpm/[place].

## 2023-01-12 NOTE — Plan of Care (Signed)
  Problem: Acute Rehab PT Goals(only PT should resolve) Goal: Pt Will Go Supine/Side To Sit Outcome: Progressing Flowsheets (Taken 01/12/2023 1231) Pt will go Supine/Side to Sit: with supervision Goal: Patient Will Transfer Sit To/From Stand Outcome: Progressing Flowsheets (Taken 01/12/2023 1231) Patient will transfer sit to/from stand: with supervision Goal: Pt Will Transfer Bed To Chair/Chair To Bed Outcome: Progressing Flowsheets (Taken 01/12/2023 1231) Pt will Transfer Bed to Chair/Chair to Bed: with supervision Goal: Pt Will Ambulate Outcome: Progressing Flowsheets (Taken 01/12/2023 1231) Pt will Ambulate:  50 feet  with supervision  with least restrictive assistive device   12:32 PM, 01/12/23 Ocie Bob, MPT Physical Therapist with [facility] [phone] office [phone] mobile phone

## 2023-01-12 NOTE — ED Triage Notes (Signed)
  Pt stated that she was seen at urgent care early yesterday for SOB and nothing was done to help her so she went to [facility] ER where they did blood work and a CXR. Pt left AMA after having to wait. Pt stated taht she has not been diagnosed with CHF or COPD and this has never happened before. 3 neb tx on EMS. Currently on 4 L O2

## 2023-01-12 NOTE — H&P (Addendum)
History and Physical  Victoria Dean VQQ:[id number] DOB: Mar 27, 1950 DOA: 01/12/2023  Referring physician: Dr. Pilar Plate, EDP  PCP: Oneal Grout, FNP  Outpatient Specialists: Medical oncology. Patient coming from: Home.  Chief Complaint: Shortness of breath.  HPI: Victoria Dean is a 72 y.o. female with medical history significant for breast cancer on letrozole, chronic anxiety/depression, hypertension, hyperlipidemia, GERD, severe morbid obesity, who initially presented to [facility] ED where she left AMA due to lengthy wait time.  Has been having progressively worsening shortness of breath, nonproductive cough, and palpitations for the last several days.  No reported subjective fevers or chills.  In the ED, tachycardic and tachypneic with new oxygen requirement.  BP 125/68, pulse 108, respiration rate 25, O2 saturation 92% on 4 L.    CT angio chest was negative for pulmonary embolism however showed bronchitis with patchy areas of bronchiolitis/bronchopneumonia in the bilateral lungs.  The patient was started on empiric IV antibiotic Rocephin and azithromycin.  Admitted by [facility], hospitalist service.  ED Course: Temperature 98.7.  BP 145/64, pulse 112, respiratory 20, O2 saturation 99% on BiPAP.  Lab work: WBC 7.7, hemoglobin 11.5, at baseline, platelet count 204.  Serum glucose 140.  Troponin 27, repeat 25.  Review of Systems: Review of systems as noted in the HPI. All other systems reviewed and are negative.   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    Vitamin D deficiency    Past Surgical History:  Procedure Laterality Date   ABDOMINAL HYSTERECTOMY  1995   APPENDECTOMY  1994   BREAST BIOPSY Right 1998   neg   BREAST BIOPSY Left 05/30/2020   stere bx x clip path pending   BREAST LUMPECTOMY WITH SENTINEL LYMPH NODE BIOPSY Left 06/29/2020   Procedure: BREAST LUMPECTOMY WITH SENTINEL LYMPH NODE BX;  Surgeon: Earline Mayotte, MD;  Location: [facility] ORS;  Service: General;  Laterality: Left;   CATARACT EXTRACTION W/PHACO Left 01/31/2022   Procedure: CATARACT EXTRACTION PHACO AND INTRAOCULAR LENS PLACEMENT (IOC);  Surgeon: Fabio Pierce, MD;  Location: [facility] ORS;  Service: Ophthalmology;  Laterality: Left;  CDE 8.10   CATARACT EXTRACTION W/PHACO Right 02/21/2022   Procedure: CATARACT EXTRACTION PHACO AND INTRAOCULAR LENS PLACEMENT (IOC);  Surgeon: Fabio Pierce, MD;  Location: [facility] OR[facility];  Service: Ophthalmology;  Laterality: Right;  CDE: 9.42   ESI     LUMBAR MICRODISCECTOMY  2019   L3-4   LUMBAR MICRODISCECTOMY  2008   L4-L5 repeated   MICRODISCECTOMY LUMBAR  2008   L4-5   MICRODISCECTOMY LUMBAR  2010   L4-L5 repeated   NODE DISSECTION Left 08/13/2020   Procedure: AXILLARY NODE DISSECTION;  Surgeon: Earline Mayotte, MD;  Location: [facility] ORS;  Service: General;  Laterality: Left;   PORTACATH PLACEMENT Right 09/05/2020   Procedure: INSERTION PORT-A-CATH;  Surgeon: Earline Mayotte, MD;  Location: [facility] ORS;  Service: General;  Laterality: Right;  right; monitor anesthesia care   SQUAMOUS CELL CARCINOMA EXCISION     TONSILLECTOMY  1971    Social History:  reports that she quit smoking about 4 years ago. Her smoking use included cigarettes. She started smoking about 37 years ago. She has a 30 pack-year smoking history. She has never used smokeless tobacco. She reports current alcohol use. She reports that she does not use drugs.   Allergies  Allergen Reactions   Other Itching and Other (See Comments)    Brand name  bandaids with cloth backing causes redness and itching   Statins     Muscle weakness   Stiolto Respimat [Tiotropium Bromide-Olodaterol]    Zetia [Ezetimibe]     Leg pain, upset stomach   Sulfa Antibiotics Rash    Family History  Problem Relation Age of Onset   Breast cancer Mother 5   Osteosarcoma Mother        d. 36   Hypertension Brother    COPD Father    Hypertension Father    Breast cancer Paternal Aunt        dx 49s   Breast cancer Paternal Grandmother        dx 72s   Stomach cancer Paternal Grandfather        dx 45s   Breast cancer Paternal Aunt        dx 41s   Brain cancer Paternal Aunt        dx 59s      Prior to Admission medications   Medication Sig Start Date End Date Taking? Authorizing Provider  acetaminophen (TYLENOL) 650 MG CR tablet Take 1,300 mg by mouth every 8 (eight) hours as needed for pain.    [provider]  albuterol (VENTOLIN HFA) 108 (90 Base) MCG/ACT inhaler Inhale 2 puffs into the lungs every 6 (six) hours as needed for wheezing or shortness of breath.    [provider]  amLODipine (NORVASC) 5 MG tablet Take by mouth. 10/09/22   [provider]  aspirin EC 81 MG tablet Take 1 tablet (81 mg total) by mouth daily. Swallow whole. 06/29/22 06/29/23  GhimireWerner Lean, MD  B-D 3CC LUER-LOK SYR 25GX1" 25G X 1" 3 ML MISC USE 1 SYRINGE EVERY 30 DAYS 01/05/23   Creig Hines, MD  Biotin 1000 MCG tablet Take 1,000 mcg by mouth daily.    [provider]  Calcium Carbonate-Vit D-Min (CALCIUM 1200 PO) Take 1 tablet by mouth 2 (two) times daily. Patient not taking: Reported on 10/22/2022    [provider]  cholecalciferol (VITAMIN D) 25 MCG (1000 UNIT) tablet Take 1,000 Units by mouth daily.    [provider]  cyanocobalamin (VITAMIN B12) 1000 MCG/ML injection INJECT 1 ML (1,000 MCG TOTAL) INTO THE MUSCLE EVERY 30 (THIRTY) DAYS. 01/05/23   Creig Hines, MD  diazepam (VALIUM) 5 MG tablet Take 1 tablet (5 mg  total) by mouth every 12 (twelve) hours as needed for anxiety. Patient not taking: Reported on 07/22/2022 07/23/20   Bosie Clos, MD  famotidine (PEPCID) 10 MG tablet Take by mouth. Take 10 mg by mouth 2 (two) times daily as needed for Heartburn    [provider]  fluticasone-salmeterol (ADVAIR) 100-50 MCG/ACT AEPB Inhale 1 puff into the lungs 2 (two) times daily. Patient not taking: Reported on 10/22/2022 07/03/22   [provider]  hydrocortisone 2.5 % lotion Apply 1 application topically daily as needed (when washing face).  [provider]  letrozole [facility]) 2.5 MG tablet TAKE 1 TABLET EVERY DAY 01/05/23   Creig Hines, MD  loratadine (CLARITIN) 10 MG tablet Take by mouth. Take 10 mg by mouth once daily    [provider]  losartan (COZAAR) 100 MG tablet TAKE 1 TABLET EVERY DAY 05/10/21   Bosie Clos, MD  Menthol, Topical Analgesic, (BIOFREEZE EX) Apply 1 application topically daily as needed (back pain).    [provider]  mometasone (ELOCON) 0.1 % cream Apply 1 application topically daily as needed (ear irritation).    [provider]  Propylene Glycol (SYSTANE COMPLETE) 0.6 % SOLN Apply to eye.    [provider]  rosuvastatin (CRESTOR) 5 MG tablet Take 1 tablet (5 mg total) by mouth daily. 05/22/21   Bosie Clos, MD  Semaglutide,0.25 or 0.5MG /DOS, (OZEMPIC, 0.25 OR 0.5 MG/DOSE,) 2 MG/1.5ML SOPN Inject 0.25 mg every week by subcutaneous route for 28 days. 07/03/22   [provider]  triamterene-hydrochlorothiazide (DYAZIDE) 37.5-25 MG capsule TAKE 1 CAPSULE EVERY DAY Patient not taking: Reported on 10/22/2022 10/24/21   Bosie Clos, MD  venlafaxine XR (EFFEXOR-XR) 75 MG 24 hr capsule TAKE 3 CAPSULES (225 MG TOTAL) BY MOUTH DAILY WITH BREAKFAST. Patient taking differently: Take 150 mg by mouth daily with breakfast. 2 tablets at bedtime 01/10/21   Bosie Clos, MD  zoledronic acid (ZOMETA) 4  MG/5ML injection Inject 4 mg into the vein once.    [provider]    Physical Exam: BP 125/68   Pulse (!) 101   Temp 98.7 F (37.1 C) (Oral)   Resp (!) 23   Ht 5\' 3"  (1.6 m)   Wt 111.1 kg   SpO2 99%   BMI 43.40 kg/m   General: 72 y.o. year-old female well developed well nourished, weak appearing, in no acute distress.  Alert and oriented x3. Cardiovascular: Regular rate and rhythm with no rubs or gallops.  No thyromegaly or JVD noted.  No lower extremity edema. 2/4 pulses in all 4 extremities. Respiratory: Mild rales in bases.  No wheezing noted.  Poor inspiratory effort. Abdomen: Soft nontender nondistended with normal bowel sounds x4 quadrants. Muskuloskeletal: No cyanosis, clubbing or edema noted bilaterally Neuro: CN II-XII intact, strength, sensation, reflexes Skin: No ulcerative lesions noted or rashes Psychiatry: Judgement and insight appear normal. Mood is appropriate for condition and setting          Labs on Admission:  Basic Metabolic Panel: Recent Labs  Lab 01/11/23 1426 01/12/23 0300  NA 133* 136  K 3.6 3.7  CL 98 100  CO2 26 26  GLUCOSE 104* 140*  BUN 17 16  CREATININE 0.87 0.85  CALCIUM 8.7* 9.1   Liver Function Tests: Recent Labs  Lab 01/12/23 0300  AST 21  ALT 28  ALKPHOS 113  BILITOT 0.5  PROT 7.0  ALBUMIN 3.7   No results for input(s): "LIPASE", "AMYLASE" in the last 168 hours. No results for input(s): "AMMONIA" in the last 168 hours. CBC: Recent Labs  Lab 01/11/23 1426 01/12/23 0300  WBC 7.1 7.7  HGB 11.9* 11.5*  HCT 37.4 35.7*  MCV 88.6 88.6  PLT 222 204   Cardiac Enzymes: No results for input(s): "CKTOTAL", "CKMB", "CKMBINDEX", "TROPONINI" in the last 168 hours.  BNP (last 3 results) Recent Labs    01/12/23 0300  BNP 65.0    ProBNP (last 3 results) No results for input(s): "PROBNP" in the last 8760 hours.  CBG: No  results for input(s): "GLUCAP" in the last 168 hours.  Radiological Exams on Admission: CT  Angio Chest Pulmonary Embolism (PE) W or WO Contrast  Result Date: 01/12/2023 CLINICAL DATA:  Pulmonary embolism suspected.  Shortness of breath. EXAM: CT ANGIOGRAPHY CHEST WITH CONTRAST TECHNIQUE: Multidetector CT imaging of the chest was performed using the standard protocol during bolus administration of intravenous contrast. Multiplanar CT image reconstructions and MIPs were obtained to evaluate the vascular anatomy. RADIATION DOSE REDUCTION: This exam was performed according to the departmental dose-optimization program which includes automated exposure control, adjustment of the mA and/or kV according to patient size and/or use of iterative reconstruction technique. CONTRAST:  OMNIPAQUE IOHEXOL 350 MG/ML SOLN COMPARISON:  01/09/2021 FINDINGS: Cardiovascular: Suboptimal although satisfactory opacification of the pulmonary arteries to the segmental level. There is further intermittent limitation from motion artifact. No evidence of pulmonary embolism. Normal heart size. No pericardial effusion. Extensive coronary atheromatous calcification. Mediastinum/Nodes: Negative for mass or adenopathy. Lungs/Pleura: Airway thickening with areas of patchy clustered nodularity in the right more than left lung. No lobar consolidation, edema, effusion, or pneumothorax. Upper Abdomen: Negative Musculoskeletal: Generalized spondylitic spurring and primarily upper lumbar disc space narrowing. Prominent posterior spur the midthoracic spine. Other: Postoperative left breast. Review of the MIP images confirms the above findings. IMPRESSION: 1. Bronchitis with patchy areas of bronchiolitis/bronchopneumonia in the bilateral lungs. 2. Motion degraded chest CTA with no evidence of pulmonary embolism. 3. Atherosclerosis, including the coronary arteries. Electronically Signed   By: Tiburcio Pea M.D.   On: 01/12/2023 04:27   DG Chest 2 View  Result Date: 01/11/2023 CLINICAL DATA:  Shortness of breath with chest pain. EXAM:  CHEST - 2 VIEW COMPARISON:  Radiographs 06/28/2022 and 01/19/2021.  CT 01/19/2021. FINDINGS: The heart size and mediastinal contours are normal. The lungs are clear. There is no pleural effusion or pneumothorax. No acute osseous findings are identified. Postsurgical changes in the left breast and left axilla. Degenerative changes throughout the thoracic spine. IMPRESSION: No evidence of acute cardiopulmonary process. Electronically Signed   By: Carey Bullocks M.D.   On: 01/11/2023 14:51    EKG: I independently viewed the EKG done and my findings are as followed: Sinus tachycardia rate of 108.  Nonspecific ST-T changes.  QTc 457.  Assessment/Plan Present on Admission:  CAP (community acquired pneumonia)  Principal Problem:   CAP (community acquired pneumonia)  Multifocal pneumonia, POA Seen on CT angio chest which was negative for pulmonary embolism Continue empiric IV antibiotics Rocephin and IV azithromycin As needed bronchodilators Antitussives as needed  Acute hypoxic respiratory failure secondary to the above Not on oxygen supplementation at baseline Currently requiring 4 L to maintain O2 saturation above 92% Wean off oxygen supplementation as tolerated. Early mobilization.  Elevated troponin, suspect demand ischemia in the setting of acute hypoxic respiratory failure No reported anginal symptoms. High-sensitivity troponin peaked at 27 and downtrending. Last 2D echo done on 06/29/2022 revealed LVEF 60 to 65% with no regional wall motion abnormalities. Monitor on telemetry  Chronic anxiety/depression Resume home regimen.  Hyperlipidemia Resume home regimen.  GERD Resume home H2 blocker.  Generalized weakness PT OT assessment Fall precautions.  Hyperglycemia Last hemoglobin A1c 6.1 on 10/22/2021 Repeat hemoglobin A1c Start insulin sliding scale  Sinus tachycardia, suspect secondary to hypovolemia Hypovolemic on exam NS at 40 cc/h x 1 day.  Physical debility PT OT  assessment Fall precautions.   Time: 75 minutes.   DVT prophylaxis: Subcu Lovenox daily.  Code Status: Full code.  Family Communication:  Daughter at bedside.  Disposition Plan: Admitted to telemetry unit  Consults called: None.  Admission status: Inpatient status.   Status is: Inpatient The patient requires at least 2 midnights for further evaluation and treatment of present condition.   Darlin Drop MD [facility] Pager [phone]  If 7PM-7AM, please contact night-coverage [email or web address] Password [email or web address]  01/12/2023, 5:13 AM

## 2023-01-12 NOTE — TOC Initial Note (Signed)
Transition of Care [facility]) - Initial/Assessment Note    Patient Details  Name: Victoria Dean MRN: [id number] Date of Birth: [birth year]  [facility] CM/SW Contact:    Villa Herb, LCSWA Phone Number: 01/12/2023, 12:49 PM  Clinical Narrative:                 TOC updated that PT is recommending SNF for pt. CSW met with pt at bedside to review recommendation. Pt states that she prefers to return home once medically stable. CSW spoke with pt about HH being set up. Pt states she would like to see how she does with weaning on O2 before deciding about that. TOC to follow.   Expected Discharge Plan: Home w Home Health Services Barriers to Discharge: Continued Medical Work up   Patient Goals and CMS Choice Patient states their goals for this hospitalization and ongoing recovery are:: return home CMS Medicare.gov Compare Post Acute Care list provided to:: Patient Choice offered to / list presented to : Patient      Expected Discharge Plan and Services In-house Referral: Clinical Social Work Discharge Planning Services: CM Consult Post Acute Care Choice: Home Health Living arrangements for the past 2 months: Single Family Home                                      Prior Living Arrangements/Services Living arrangements for the past 2 months: Single Family Home Lives with:: Self Patient language and need for interpreter reviewed:: Yes Do you feel safe going back to the place where you live?: Yes      Need for Family Participation in Patient Care: Yes (Comment) Care giver support system in place?: Yes (comment)   Criminal Activity/Legal Involvement Pertinent to Current Situation/Hospitalization: No - Comment as needed  Activities of Daily Living   ADL Screening (condition at time of admission) Independently performs ADLs?: Yes (appropriate for developmental age) Is the patient deaf or have difficulty hearing?: No Does the patient have difficulty seeing, even  when wearing glasses/contacts?: No Does the patient have difficulty concentrating, remembering, or making decisions?: No  Permission Sought/Granted                  Emotional Assessment Appearance:: Appears stated age Attitude/Demeanor/Rapport: Engaged Affect (typically observed): Accepting Orientation: : Oriented to Self, Oriented to Place, Oriented to  Time, Oriented to Situation Alcohol / Substance Use: Not Applicable Psych Involvement: No (comment)  Admission diagnosis:  CAP (community acquired pneumonia) [J18.9] Patient Active Problem List   Diagnosis Date Noted   CAP (community acquired pneumonia) 01/12/2023   TIA (transient ischemic attack) 06/29/2022   Hypokalemia 06/29/2022   GERD (gastroesophageal reflux disease) 06/29/2022   Hyperlipidemia 06/29/2022   Lumbar radiculopathy 08/14/2021   Acute on chronic respiratory failure with hypoxia (HCC) 01/19/2021   Upper respiratory infection with cough and congestion 01/17/2021   B12 deficiency 10/09/2020   Genetic testing 06/26/2020   Family history of breast cancer    Family history of stomach cancer    Malignant neoplasm of upper-outer quadrant of left breast in female, estrogen receptor positive (HCC) 06/10/2020   Goals of care, counseling/discussion 06/10/2020   Impingement syndrome of right shoulder region 05/06/2019   Low back pain 01/24/2019   Personal history of tobacco use, presenting hazards to health 02/23/2018   History of lumbar laminectomy 04/30/2016   Actinic keratosis 11/21/2014   Adaptation reaction 11/21/2014  Allergic rhinitis 11/21/2014   Cornu cutaneum 11/21/2014   Clinical depression 11/21/2014   Dermatitis, eczematoid 11/21/2014   Enthesopathy 11/21/2014   Essential (primary) hypertension 11/21/2014   Anxiety, generalized 11/21/2014   Herniated nucleus pulposus 11/21/2014   Mild episode of recurrent major depressive disorder (HCC) 11/21/2014   Adiposity 11/21/2014   Arthritis, degenerative  11/21/2014   Basal cell papilloma 11/21/2014   Avitaminosis D 11/21/2014   PCP:  Oneal Grout, FNP Pharmacy:   [facility] - [address] [place] - [address] [place] [address] Phone: [phone] Fax: [phone]  [facility] - [place], [place] - [address] [place] [address] Phone: [phone] Fax: [phone]     Social Determinants of Health (SDOH) Social History: SDOH Screenings   Food Insecurity: No Food Insecurity (01/12/2023)  Housing: Low Risk  (01/12/2023)  Transportation Needs: No Transportation Needs (01/12/2023)  Utilities: Not At Risk (01/12/2023)  Alcohol Screen: Low Risk  (10/23/2021)  Depression (PHQ2-9): Low Risk  (10/23/2021)  Financial Resource Strain: Low Risk  (10/23/2021)  Physical Activity: Inactive (02/16/2018)  Social Connections: Moderately Isolated (10/23/2021)  Stress: No Stress Concern Present (02/16/2018)  Tobacco Use: Medium Risk (01/12/2023)   SDOH Interventions:     Readmission Risk Interventions    01/12/2023   12:48 PM  Readmission Risk Prevention Plan  Medication Screening Complete  Transportation Screening Complete

## 2023-01-13 DIAGNOSIS — J189 Pneumonia, unspecified organism: Secondary | ICD-10-CM | POA: Diagnosis not present

## 2023-01-13 DIAGNOSIS — J9601 Acute respiratory failure with hypoxia: Secondary | ICD-10-CM | POA: Diagnosis not present

## 2023-01-13 LAB — BASIC METABOLIC PANEL
Anion gap: 9 (ref 5–15)
BUN: 21 mg/dL (ref 8–23)
CO2: 25 mmol/L (ref 22–32)
Calcium: 9.1 mg/dL (ref 8.9–10.3)
Chloride: 105 mmol/L (ref 98–111)
Creatinine, Ser: 0.8 mg/dL (ref 0.44–1.00)
GFR, Estimated: >60 mL/min (ref >60)
Glucose, Bld: 122 mg/dL — ABNORMAL HIGH (ref 70–99)
Potassium: 4 mmol/L (ref 3.5–5.1)
Sodium: 139 mmol/L (ref 135–145)

## 2023-01-13 LAB — HEMOGLOBIN A1C
Hgb A1c MFr Bld: 5.8 % — ABNORMAL HIGH (ref 4.8–5.6)
Mean Plasma Glucose: 119.76 mg/dL

## 2023-01-13 LAB — GLUCOSE, CAPILLARY
Glucose-Capillary: 110 mg/dL — ABNORMAL HIGH (ref 70–99)
Glucose-Capillary: 122 mg/dL — ABNORMAL HIGH (ref 70–99)
Glucose-Capillary: 151 mg/dL — ABNORMAL HIGH (ref 70–99)
Glucose-Capillary: 158 mg/dL — ABNORMAL HIGH (ref 70–99)

## 2023-01-13 LAB — RESPIRATORY PANEL BY PCR

## 2023-01-13 LAB — CBC
HCT: 37.2 % (ref 36.0–46.0)
Hemoglobin: 11.8 g/dL — ABNORMAL LOW (ref 12.0–15.0)
MCH: 28.6 pg (ref 26.0–34.0)
MCHC: 31.7 g/dL (ref 30.0–36.0)
MCV: 90.3 fL (ref 80.0–100.0)
Platelets: 237 10*3/uL (ref 150–400)
RBC: 4.12 MIL/uL (ref 3.87–5.11)
RDW: 15.9 % — ABNORMAL HIGH (ref 11.5–15.5)
WBC: 13.1 10*3/uL — ABNORMAL HIGH (ref 4.0–10.5)
nRBC: 0 % (ref 0.0–0.2)

## 2023-01-13 MED ORDER — ALPRAZOLAM 0.5 MG PO TABS
0.5000 mg | ORAL_TABLET | Freq: Three times a day (TID) | ORAL | Status: DC | PRN
  Administered 2023-01-13 – 2023-01-15 (×4): 0.5 mg via ORAL
  Filled 2023-01-13 (×4): qty 1

## 2023-01-13 MED ORDER — IPRATROPIUM-ALBUTEROL 0.5-2.5 (3) MG/3ML IN SOLN: 3.0000 mL | RESPIRATORY_TRACT | Status: DC

## 2023-01-13 MED ORDER — BUDESONIDE 0.5 MG/2ML IN SUSP
RESPIRATORY_TRACT | Status: AC
  Filled 2023-01-13: qty 2

## 2023-01-13 MED ORDER — FUROSEMIDE 10 MG/ML IJ SOLN
40.0000 mg | Freq: Once | INTRAMUSCULAR | Status: AC
  Administered 2023-01-13: 40 mg via INTRAVENOUS
  Filled 2023-01-13: qty 4

## 2023-01-13 MED ORDER — BUDESONIDE 0.5 MG/2ML IN SUSP
0.5000 mg | Freq: Two times a day (BID) | RESPIRATORY_TRACT | Status: DC
  Administered 2023-01-13 – 2023-01-21 (×17): 0.5 mg via RESPIRATORY_TRACT
  Filled 2023-01-13 (×17): qty 2

## 2023-01-13 NOTE — Progress Notes (Signed)
PROGRESS NOTE    Patient: Victoria Dean                            PCP: Oneal Grout, FNP                    DOB: 1951-01-24            DOA: 01/12/2023 DGU:[id number]             DOS: 01/13/2023, 10:21 AM   LOS: 1 day   Date of Service: The patient was seen and examined on 01/13/2023  Subjective:   The patient was seen and examined this morning, stating that she had a rough night, with increased shortness of breath, wheezing Denies any chest pain Anxious due to shortness of breath   Brief Narrative:   Victoria Dean is a 72 y.o. female with medical history significant for breast cancer on letrozole, chronic anxiety/depression, hypertension, hyperlipidemia, GERD, severe morbid obesity, who initially presented to [facility] ED where she left AMA due to lengthy wait time.  Has been having progressively worsening shortness of breath, nonproductive cough, and palpitations for the last several days.  No reported subjective fevers or chills.   ED: She was tachycardic and tachypneic with new oxygen requirement.  BP 125/68, pulse 108, respiration rate 25, O2 saturation 92% on 4 L.   Lab work: WBC 7.7, hemoglobin 11.5, at baseline, platelet count 204.  Serum glucose 140.  Troponin 27, repeat 25  CT angio chest was negative for pulmonary embolism however showed bronchitis with patchy areas of bronchiolitis/bronchopneumonia in the bilateral lungs.  The patient was started on empiric IV antibiotic Rocephin and azithromycin.   Admitted by [facility], hospitalist service.  Assessment/Plan Present on Admission: Acute respiratory failure-hypoxia Multifocal pneumonia   Acute hypoxic respiratory failure secondary to the above -Significant shortness of breath, diffuse wheezing, rhonchi, pursed lip breathing this morning -Satting 100% on 4 L of oxygen -weaning off slowly  - Not on oxygen supplementation at baseline -at home -Scheduling DuoNeb bronchodilators, adding Pulmicort nebs -Continue IV steroids -Lasix  40 mg x 1 -Continue treating underlying pneumonia -Continue mucolytics, spirometer,   Multifocal pneumonia, POA -With acute respiratory failure, still requiring 4 L of oxygen this morning, shortness of breath with wheezing, rhonchi no crackles -CTA-negative for pulmonary embolism -Continue current antibiotics of IV Rocephin and azithromycin  - As needed bronchodilators - Antitussives as needed    Elevated troponin, suspect demand ischemia in the setting of acute hypoxic respiratory failure -Denies any chest pain - High-sensitivity troponin peaked at 27 and downtrending. - Last 2D echo done on 06/29/2022 revealed LVEF 60 to 65% with no regional wall motion abnormalities. - Monitor on telemetry   Chronic anxiety/depression Resume home regimen. -Added as needed Xanax   Hyperlipidemia Continue statins   GERD Continue PPI   Generalized weakness PT OT assessment Fall precautions.   Hyperglycemia Last hemoglobin A1c 6.1 on 10/22/2021 Repeat hemoglobin A1c Start insulin sliding scale   Sinus tachycardia, suspect secondary to hypovolemia Monitoring, status post IV fluid resuscitation -Worsening tachycardia with breathing treatments, anxiety   Physical debility PT OT assessment Fall precautions.     --------------------------------------------------------------------------------------------------------------------------------------------- Nutritional status:  The patient's BMI is: Body mass index is 43.4 kg/m. I agree with the assessment and plan as outlined -----------------------------------------------------------------------------------------------------------------------------------------  DVT prophylaxis:  SCD   Code Status:   Code Status: Full Code  Family Communication: No family member present at  bedside- -Advance care planning has been discussed.   Admission status:   Status is: Inpatient Remains inpatient appropriate because: Needing breathing  treatment, respiratory support 4 L of oxygen, nebs, IV antibiotics     Disposition: From  - home             Planning for discharge in 1-2 days Home   Procedures:   No admission procedures for hospital encounter.   Antimicrobials:  Anti-infectives (From admission, onward)    Start     Dose/Rate Route Frequency Ordered Stop   01/13/23 0600  cefTRIAXone (ROCEPHIN) 2 g in sodium chloride 0.9 % 100 mL IVPB        2 g 200 mL/hr over 30 Minutes Intravenous Every 24 hours 01/12/23 0632     01/13/23 0500  azithromycin (ZITHROMAX) 500 mg in sodium chloride 0.9 % 250 mL IVPB  Status:  Discontinued        500 mg 250 mL/hr over 60 Minutes Intravenous Every 24 hours 01/12/23 0632 01/12/23 0704   01/12/23 1000  azithromycin (ZITHROMAX) tablet 500 mg        500 mg Oral Daily 01/12/23 0704     01/12/23 0445  cefTRIAXone (ROCEPHIN) 2 g in sodium chloride 0.9 % 100 mL IVPB        2 g 200 mL/hr over 30 Minutes Intravenous  Once 01/12/23 0439 01/12/23 0641   01/12/23 0445  azithromycin (ZITHROMAX) 500 mg in sodium chloride 0.9 % 250 mL IVPB  Status:  Discontinued        500 mg 250 mL/hr over 60 Minutes Intravenous Every 24 hours 01/12/23 0439 01/12/23 9629        Medication:   aspirin EC  81 mg Oral Daily   azithromycin  500 mg Oral Daily   budesonide (PULMICORT) nebulizer solution  0.5 mg Nebulization BID   cholecalciferol  1,000 Units Oral Daily   [START ON 02/06/2023] cyanocobalamin  1,000 mcg Intramuscular Q30 days   famotidine  20 mg Oral Daily   guaiFENesin-dextromethorphan  10 mL Oral Q8H   insulin aspart  0-5 Units Subcutaneous QHS   insulin aspart  0-9 Units Subcutaneous TID WC   ipratropium  0.5 mg Nebulization Q6H   ipratropium-albuterol  3 mL Nebulization STAT   letrozole  2.5 mg Oral Daily   levalbuterol  1.25 mg Nebulization Q6H   methylPREDNISolone sodium succinate  60 mg Intravenous Q24H   rosuvastatin  5 mg Oral Daily   venlafaxine XR  75 mg Oral QHS     acetaminophen, ALPRAZolam, melatonin, polyethylene glycol, prochlorperazine   Objective:   Vitals:   01/12/23 2315 01/13/23 0106 01/13/23 0414 01/13/23 0837  BP: (!) 154/82  (!) 162/89   Pulse: (!) 104  (!) 109   Resp: (!) 32  18   Temp: 99.2 F (37.3 C)  97.9 F (36.6 C)   TempSrc: Oral  Oral   SpO2: 98% 98% 96% 100%  Weight:      Height:        Intake/Output Summary (Last 24 hours) at 01/13/2023 1021 Last data filed at 01/13/2023 0452 Gross per 24 hour  Intake 855.12 ml  Output --  Net 855.12 ml   Filed Weights   01/12/23 0217  Weight: 111.1 kg     Physical examination:   General:  Shortness of breath at rest, AAO x 3,  cooperative, no distress;   HEENT:  Normocephalic, PERRL, otherwise with in Normal limits   Neuro:  CNII-XII intact. ,  normal motor and sensation, reflexes intact   Lungs:   Shortness of breath, diffusely wheezing, rhonchi, minimal bilateral lower lobe crackles  Cardio:    S1/S2, RRR, No murmure, No Rubs or Gallops   Abdomen:  Soft, non-tender, bowel sounds active all four quadrants, no guarding or peritoneal signs.  Muscular  skeletal:  Limited exam -global generalized weaknesses - in bed, able to move all 4 extremities,   2+ pulses,  symmetric, +1 pitting edema  Skin:  Dry, warm to touch, negative for any Rashes,  Wounds: Please see nursing documentation         ------------------------------------------------------------------------------------------------------------------------------------------    LABs:     Latest Ref Rng & Units 01/13/2023    4:30 AM 01/12/2023    3:00 AM 01/11/2023    2:26 PM  CBC  WBC 4.0 - 10.5 K/uL 13.1  7.7  7.1   Hemoglobin 12.0 - 15.0 g/dL 16.1  09.6  04.5   Hematocrit 36.0 - 46.0 % 37.2  35.7  37.4   Platelets 150 - 400 K/uL 237  204  222       Latest Ref Rng & Units 01/13/2023    4:30 AM 01/12/2023    3:00 AM 01/11/2023    2:26 PM  CMP  Glucose 70 - 99 mg/dL 409  811  914   BUN 8 - 23 mg/dL  21  16  17    Creatinine 0.44 - 1.00 mg/dL 7.82  9.56  2.13   Sodium 135 - 145 mmol/L 139  136  133   Potassium 3.5 - 5.1 mmol/L 4.0  3.7  3.6   Chloride 98 - 111 mmol/L 105  100  98   CO2 22 - 32 mmol/L 25  26  26    Calcium 8.9 - 10.3 mg/dL 9.1  9.1  8.7   Total Protein 6.5 - 8.1 g/dL  7.0    Total Bilirubin <1.2 mg/dL  0.5    Alkaline Phos 38 - 126 U/L  113    AST 15 - 41 U/L  21    ALT 0 - 44 U/L  28         Micro Results No results found for this or any previous visit (from the past 240 hour(s)).  Radiology Reports No results found.  SIGNED: Kendell Bane, MD, FHM. FAAFP. [facility] - [facility] Time spent - 55 min.  In seeing, evaluating and examining the patient. Reviewing medical records, labs, drawn plan of care. [facility],  Pager (please use [email or web address] to page/ text) Please use Epic Secure Chat for non-urgent communication (7AM-7PM)  If 7PM-7AM, please contact night-coverage [email or web address], 01/13/2023, 10:21 AM

## 2023-01-13 NOTE — Plan of Care (Signed)

## 2023-01-14 DIAGNOSIS — J189 Pneumonia, unspecified organism: Secondary | ICD-10-CM | POA: Diagnosis not present

## 2023-01-14 DIAGNOSIS — J9601 Acute respiratory failure with hypoxia: Secondary | ICD-10-CM | POA: Diagnosis not present

## 2023-01-14 LAB — BASIC METABOLIC PANEL
Anion gap: 10 (ref 5–15)
BUN: 33 mg/dL — ABNORMAL HIGH (ref 8–23)
CO2: 29 mmol/L (ref 22–32)
Calcium: 9 mg/dL (ref 8.9–10.3)
Chloride: 101 mmol/L (ref 98–111)
Creatinine, Ser: 0.86 mg/dL (ref 0.44–1.00)
GFR, Estimated: >60 mL/min (ref >60)
Glucose, Bld: 98 mg/dL (ref 70–99)
Potassium: 4.4 mmol/L (ref 3.5–5.1)
Sodium: 140 mmol/L (ref 135–145)

## 2023-01-14 LAB — GLUCOSE, CAPILLARY
Glucose-Capillary: 94 mg/dL (ref 70–99)
Glucose-Capillary: 97 mg/dL (ref 70–99)
Glucose-Capillary: 195 mg/dL — ABNORMAL HIGH (ref 70–99)
Glucose-Capillary: 113 mg/dL — ABNORMAL HIGH (ref 70–99)

## 2023-01-14 LAB — CBC
HCT: 37.7 % (ref 36.0–46.0)
Hemoglobin: 11.7 g/dL — ABNORMAL LOW (ref 12.0–15.0)
MCH: 28.6 pg (ref 26.0–34.0)
MCHC: 31 g/dL (ref 30.0–36.0)
MCV: 92.2 fL (ref 80.0–100.0)
Platelets: 229 10*3/uL (ref 150–400)
RBC: 4.09 MIL/uL (ref 3.87–5.11)
RDW: 15.9 % — ABNORMAL HIGH (ref 11.5–15.5)
WBC: 7.8 10*3/uL (ref 4.0–10.5)
nRBC: 0 % (ref 0.0–0.2)

## 2023-01-14 LAB — SARS CORONAVIRUS 2 BY RT PCR: SARS Coronavirus 2 by RT PCR: NEGATIVE

## 2023-01-14 NOTE — Evaluation (Signed)
Occupational Therapy Evaluation Patient Details Name: Victoria Dean MRN: [id number] DOB: May 11, 1950 Today's Date: 01/14/2023   History of Present Illness Victoria Dean is a 72 y.o. female with medical history significant for breast cancer on letrozole, chronic anxiety/depression, hypertension, hyperlipidemia, GERD, severe morbid obesity, who initially presented to [facility] ED where she left AMA due to lengthy wait time.  Has been having progressively worsening shortness of breath, nonproductive cough, and palpitations for the last several days.  No reported subjective fevers or chills.     In the ED, tachycardic and tachypneic with new oxygen requirement.  BP 125/68, pulse 108, respiration rate 25, O2 saturation 92% on 4 L.       CT angio chest was negative for pulmonary embolism however showed bronchitis with patchy areas of bronchiolitis/bronchopneumonia in the bilateral lungs.  The patient was started on empiric IV antibiotic Rocephin and azithromycin.  Admitted by [facility], hospitalist service.   Clinical Impression   Pt agreeable to OT evaluation. Pt mobilizing from bed to bathroom for toileting task at supervision level. Performing grooming in standing. Pt requiring rest breaks in bathroom and at bedside, completing PLB for SOB. Discussed adaptive equipment for toileting as pt reports difficulty since 4 lumbar surgeries. Recommend continued OOB activity with nursing and mobility. No further skilled OT services required at this time.        If plan is discharge home, recommend the following: Assistance with cooking/housework;Assist for transportation    Functional Status Assessment  Patient has had a recent decline in their functional status and demonstrates the ability to make significant improvements in function in a reasonable and predictable amount of time.  Equipment Recommendations  Tub/shower seat       Precautions / Restrictions Restrictions Weight Bearing Restrictions: No       Mobility Bed Mobility Overal bed mobility: Modified Independent Bed Mobility: Supine to Sit     Supine to sit: Modified independent (Device/Increase time), HOB elevated          Transfers Overall transfer level: Needs assistance Equipment used: Rolling walker (2 wheels) Transfers: Sit to/from Stand Sit to Stand: Supervision                      ADL either performed or assessed with clinical judgement   ADL Overall ADL's : Needs assistance/impaired     Grooming: Wash/dry hands;Supervision/safety;Standing                   Toilet Transfer: Supervision/safety;Ambulation;Rolling walker (2 wheels) Toilet Transfer Details (indicate cue type and reason): Mobilizing to bathroom from bed, using RW Toileting- Clothing Manipulation and Hygiene: Supervision/safety;Sitting/lateral lean;Sit to/from stand       Functional mobility during ADLs: Supervision/safety;Rolling walker (2 wheels) General ADL Comments: Pt requiring increased time and rest breaks due to SOB. Good return for PLB     Vision Baseline Vision/History: 0 No visual deficits Ability to See in Adequate Light: 0 Adequate Patient Visual Report: No change from baseline Vision Assessment?: No apparent visual deficits            Pertinent Vitals/Pain Pain Assessment Pain Assessment: No/denies pain     Extremity/Trunk Assessment Upper Extremity Assessment Upper Extremity Assessment: Overall WFL for tasks assessed   Lower Extremity Assessment Lower Extremity Assessment: Defer to PT evaluation   Cervical / Trunk Assessment Cervical / Trunk Assessment: Normal   Communication Communication Communication: No apparent difficulties   Cognition Arousal: Alert Behavior During Therapy: WFL for tasks assessed/performed Overall  Cognitive Status: Within Functional Limits for tasks assessed                                                  Home Living Family/patient expects to be  discharged to:: Private residence Living Arrangements: Alone Available Help at Discharge: Family;Available PRN/intermittently Type of Home: House Home Access: Stairs to enter [employer] of Steps: 2 Entrance Stairs-Rails: None Home Layout: One level         Firefighter: Standard     Home Equipment: Cane - single point          Prior Functioning/Environment Prior Level of Function : Independent/Modified Independent             Mobility Comments: Household and short distanced community ambulator ADLs Comments: Independent        OT Problem List: Cardiopulmonary status limiting activity       AM-PAC OT "6 Clicks" Daily Activity     Outcome Measure Help from another person eating meals?: None Help from another person taking care of personal grooming?: None Help from another person toileting, which includes using toliet, bedpan, or urinal?: None Help from another person bathing (including washing, rinsing, drying)?: A Little Help from another person to put on and taking off regular upper body clothing?: None Help from another person to put on and taking off regular lower body clothing?: A Little 6 Click Score: 22   End of Session Equipment Utilized During Treatment: Gait belt;Rolling walker (2 wheels);Oxygen  Activity Tolerance: Patient tolerated treatment well Patient left: in bed;with call bell/phone within reach  OT Visit Diagnosis: Muscle weakness (generalized) (M62.81)                Time: 6578-4696 OT Time Calculation (min): 18 min Charges:  OT General Charges $OT Visit: 1 Visit OT Evaluation $OT Eval Low Complexity: 1 Low  Ezra Sites, OTR/L  [phone] 01/14/2023, 3:28 PM

## 2023-01-14 NOTE — Plan of Care (Signed)

## 2023-01-14 NOTE — Progress Notes (Signed)
Pt. Ambulated to bathroom. Tolerated well. Oxygen turned down to 2L [place].

## 2023-01-14 NOTE — Progress Notes (Signed)
PROGRESS NOTE    Patient: Victoria Dean                            PCP: Oneal Grout, FNP                    DOB: [birth year]            DOA: 01/12/2023 [id number]:[id number]             DOS: 01/14/2023, 9:28 AM   LOS: 2 days   Date of Service: The patient was seen and examined on 01/14/2023  Subjective:   The patient was seen and examined this morning, stable no acute distress, still on 4 L of oxygen Satting 98%, taper down to 2 L of oxygen, currently satting 95% Still complain of shortness of breath especially with exertion, improving cough  Respiratory panel revealed parainfluenza virus 4   Brief Narrative:   Victoria Dean is a 72 y.o. female with medical history significant for breast cancer on letrozole, chronic anxiety/depression, hypertension, hyperlipidemia, GERD, severe morbid obesity, who initially presented to [facility] ED where she left AMA due to lengthy wait time.  Has been having progressively worsening shortness of breath, nonproductive cough, and palpitations for the last several days.  No reported subjective fevers or chills.   ED: She was tachycardic and tachypneic with new oxygen requirement.  BP 125/68, pulse 108, respiration rate 25, O2 saturation 92% on 4 L.   Lab work: WBC 7.7, hemoglobin 11.5, at baseline, platelet count 204.  Serum glucose 140.  Troponin 27, repeat 25  CT angio chest was negative for pulmonary embolism however showed bronchitis with patchy areas of bronchiolitis/bronchopneumonia in the bilateral lungs.  The patient was started on empiric IV antibiotic Rocephin and azithromycin.   Admitted by [facility], hospitalist service.  Assessment/Plan Present on Admission: Acute respiratory failure-hypoxia Multifocal pneumonia   Acute hypoxic respiratory failure secondary pneumonia/parainfluenza  -Still having significant shortness of breath, especially with exertion, improved diffuse wheezing and rhonchi Still having pursed lip breathing -Was on 4 L of oxygen,  wean down to 2 L this morning, satting 95% Respiratory panel Virus panel positive for parainfluenza virus 4    - Not on oxygen supplementation at baseline -at home -Scheduling DuoNeb bronchodilators, adding Pulmicort nebs -Continue IV steroids -with slow taper -Lasix 40 mg x 1 01/13/23 -Continue treating underlying pneumonia -Continue mucolytics, spirometer,   Multifocal pneumonia, POA -Likely superimposed bilateral infection -With acute respiratory failure, still was requiring 4 L of oxygen this morning down to 2 L, satting 95%  -Continue to have shortness of breath, diffuse wheezes and rhonchi  -CTA-negative for pulmonary embolism -Continue current antibiotics of IV Rocephin and azithromycin  - As needed bronchodilators - Antitussives as needed    Elevated troponin, suspect demand ischemia in the setting of acute hypoxic respiratory failure -Denies any chest pain - High-sensitivity troponin peaked at 27 and downtrending. - Last 2D echo done on 06/29/2022 revealed LVEF 60 to 65% with no regional wall motion abnormalities. - Monitor on telemetry   Chronic anxiety/depression Resume home regimen. -Added as needed Xanax   Hyperlipidemia Continue statins   GERD Continue PPI   Generalized weakness PT OT assessment Fall precautions.   Hyperglycemia Last hemoglobin A1c 6.1 on 10/22/2021 Repeat hemoglobin A1c Start insulin sliding scale   Sinus tachycardia, suspect secondary to hypovolemia Monitoring, status post IV fluid resuscitation -Worsening tachycardia with breathing treatments, anxiety  Physical debility PT OT assessment Fall precautions.     --------------------------------------------------------------------------------------------------------------------------------------------- Nutritional status:  The patient's BMI is: Body mass index is 43.4 kg/m. I agree with the assessment and plan as outlined  -----------------------------------------------------------------------------------------------------------------------------------------  DVT prophylaxis:  SCD   Code Status:   Code Status: Full Code  Family Communication: No family member present at bedside- -Advance care planning has been discussed.   Admission status:   Status is: Inpatient Remains inpatient appropriate because: Needing breathing treatment, respiratory support 4 L of oxygen, nebs, IV antibiotics     Disposition: From  - home             Planning for discharge in 1-2 days Home   Procedures:   No admission procedures for hospital encounter.   Antimicrobials:  Anti-infectives (From admission, onward)    Start     Dose/Rate Route Frequency Ordered Stop   01/13/23 0600  cefTRIAXone (ROCEPHIN) 2 g in sodium chloride 0.9 % 100 mL IVPB        2 g 200 mL/hr over 30 Minutes Intravenous Every 24 hours 01/12/23 0632     01/13/23 0500  azithromycin (ZITHROMAX) 500 mg in sodium chloride 0.9 % 250 mL IVPB  Status:  Discontinued        500 mg 250 mL/hr over 60 Minutes Intravenous Every 24 hours 01/12/23 0632 01/12/23 0704   01/12/23 1000  azithromycin (ZITHROMAX) tablet 500 mg        500 mg Oral Daily 01/12/23 0704     01/12/23 0445  cefTRIAXone (ROCEPHIN) 2 g in sodium chloride 0.9 % 100 mL IVPB        2 g 200 mL/hr over 30 Minutes Intravenous  Once 01/12/23 0439 01/12/23 0641   01/12/23 0445  azithromycin (ZITHROMAX) 500 mg in sodium chloride 0.9 % 250 mL IVPB  Status:  Discontinued        500 mg 250 mL/hr over 60 Minutes Intravenous Every 24 hours 01/12/23 0439 01/12/23 7829        Medication:   aspirin EC  81 mg Oral Daily   azithromycin  500 mg Oral Daily   budesonide (PULMICORT) nebulizer solution  0.5 mg Nebulization BID   cholecalciferol  1,000 Units Oral Daily   [START ON 02/06/2023] cyanocobalamin  1,000 mcg Intramuscular Q30 days   famotidine  20 mg Oral Daily   guaiFENesin-dextromethorphan  10 mL  Oral Q8H   insulin aspart  0-5 Units Subcutaneous QHS   insulin aspart  0-9 Units Subcutaneous TID WC   ipratropium  0.5 mg Nebulization Q6H   letrozole  2.5 mg Oral Daily   levalbuterol  1.25 mg Nebulization Q6H   methylPREDNISolone sodium succinate  60 mg Intravenous Q24H   rosuvastatin  5 mg Oral Daily   venlafaxine XR  75 mg Oral QHS    acetaminophen, ALPRAZolam, melatonin, polyethylene glycol, prochlorperazine   Objective:   Vitals:   01/13/23 2110 01/14/23 0147 01/14/23 0416 01/14/23 0908  BP:   (!) 151/86   Pulse:      Resp:      Temp:   98.4 F (36.9 C)   TempSrc:   Oral   SpO2: 98% 98%  95%  Weight:      Height:        Intake/Output Summary (Last 24 hours) at 01/14/2023 0928 Last data filed at 01/14/2023 0417 Gross per 24 hour  Intake 100 ml  Output 400 ml  Net -300 ml   Filed Weights   01/12/23 0217  Weight: 111.1 kg     Physical examination:    General:  AAO x 3,  cooperative, no distress;   HEENT:  Normocephalic, PERRL, otherwise with in Normal limits   Neuro:  CNII-XII intact. , normal motor and sensation, reflexes intact   Lungs:   Clear to auscultation BL, Respirations unlabored,  Diffuse wheezing, rhonchi  Cardio:    S1/S2, RRR, No murmure, No Rubs or Gallops   Abdomen:  Soft, non-tender, bowel sounds active all four quadrants, no guarding or peritoneal signs.  Muscular  skeletal:  Limited exam -global generalized weaknesses - in bed, able to move all 4 extremities,   2+ pulses,  symmetric, No pitting edema  Skin:  Dry, warm to touch, negative for any Rashes,  Wounds: Please see nursing documentation         ------------------------------------------------------------------------------------------------------------------------------------------    LABs:     Latest Ref Rng & Units 01/14/2023    4:24 AM 01/13/2023    4:30 AM 01/12/2023    3:00 AM  CBC  WBC 4.0 - 10.5 K/uL 7.8  13.1  7.7   Hemoglobin 12.0 - 15.0 g/dL 82.4  23.5   36.1   Hematocrit 36.0 - 46.0 % 37.7  37.2  35.7   Platelets 150 - 400 K/uL 229  237  204       Latest Ref Rng & Units 01/14/2023    4:24 AM 01/13/2023    4:30 AM 01/12/2023    3:00 AM  CMP  Glucose 70 - 99 mg/dL 98  443  154   BUN 8 - 23 mg/dL 33  21  16   Creatinine 0.44 - 1.00 mg/dL 0.08  6.76  1.95   Sodium 135 - 145 mmol/L 140  139  136   Potassium 3.5 - 5.1 mmol/L 4.4  4.0  3.7   Chloride 98 - 111 mmol/L 101  105  100   CO2 22 - 32 mmol/L 29  25  26    Calcium 8.9 - 10.3 mg/dL 9.0  9.1  9.1   Total Protein 6.5 - 8.1 g/dL   7.0   Total Bilirubin <1.2 mg/dL   0.5   Alkaline Phos 38 - 126 U/L   113   AST 15 - 41 U/L   21   ALT 0 - 44 U/L   28        Micro Results Recent Results (from the past 240 hour(s))  Respiratory (~20 pathogens) panel by PCR     Status: Abnormal   Collection Time: 01/13/23  1:32 PM   Specimen: Nasopharyngeal Swab; Respiratory  Result Value Ref Range Status   Adenovirus NOT DETECTED NOT DETECTED Final   Coronavirus 229E NOT DETECTED NOT DETECTED Final    Comment: (NOTE) The Coronavirus on the Respiratory Panel, DOES NOT test for the novel  Coronavirus (2019 nCoV)    Coronavirus HKU1 NOT DETECTED NOT DETECTED Final   Coronavirus NL63 NOT DETECTED NOT DETECTED Final   Coronavirus OC43 NOT DETECTED NOT DETECTED Final   Metapneumovirus NOT DETECTED NOT DETECTED Final   Rhinovirus / Enterovirus NOT DETECTED NOT DETECTED Final   Influenza A NOT DETECTED NOT DETECTED Final   Influenza B NOT DETECTED NOT DETECTED Final   Parainfluenza Virus 1 NOT DETECTED NOT DETECTED Final   Parainfluenza Virus 2 NOT DETECTED NOT DETECTED Final   Parainfluenza Virus 3 NOT DETECTED NOT DETECTED Final   Parainfluenza Virus 4 DETECTED (A) NOT DETECTED Final   Respiratory Syncytial Virus NOT DETECTED  NOT DETECTED Final   Bordetella pertussis NOT DETECTED NOT DETECTED Final   Bordetella Parapertussis NOT DETECTED NOT DETECTED Final   Chlamydophila pneumoniae NOT  DETECTED NOT DETECTED Final   Mycoplasma pneumoniae NOT DETECTED NOT DETECTED Final    Comment: Performed at [facility], [address], [place], [place] [address]    Radiology Reports No results found.  SIGNED: Kendell Bane, MD, FHM. FAAFP. [facility] - [facility] hospitalist Time spent - 55 min.  In seeing, evaluating and examining the patient. Reviewing medical records, labs, drawn plan of care. [facility],  Pager (please use [email or web address] to page/ text) Please use Epic Secure Chat for non-urgent communication (7AM-7PM)  If 7PM-7AM, please contact night-coverage [email or web address], 01/14/2023, 9:28 AM

## 2023-01-14 NOTE — TOC Progression Note (Signed)
Transition of Care [facility]) - Progression Note    Patient Details  Name: Victoria Dean MRN: [id number] Date of Birth: Jul 06, 1950  [facility]) CM/SW Contact  Villa Herb, [place] Phone Number: 01/14/2023, 10:49 AM  Clinical Narrative:    CSW spoke with pt at bedside about HH vs SNF at D/C. Pt states her daughter will be coming to the hospital to visit shortly. Pt would like to speak with her daughter about plan before making final decision. TOC to follow up with pt once plan is made.   Expected Discharge Plan: Home w Home Health Services Barriers to Discharge: Continued Medical Work up  Expected Discharge Plan and Services In-house Referral: Clinical Social Work Discharge Planning Services: CM Consult Post Acute Care Choice: Home Health Living arrangements for the past 2 months: Single Family Home                                       Social Determinants of Health (SDOH) Interventions SDOH Screenings   Food Insecurity: No Food Insecurity (01/12/2023)  Housing: Low Risk  (01/12/2023)  Transportation Needs: No Transportation Needs (01/12/2023)  Utilities: Not At Risk (01/12/2023)  Alcohol Screen: Low Risk  (10/23/2021)  Depression (PHQ2-9): Low Risk  (10/23/2021)  Financial Resource Strain: Low Risk  (10/23/2021)  Physical Activity: Inactive (02/16/2018)  Social Connections: Moderately Isolated (10/23/2021)  Stress: No Stress Concern Present (02/16/2018)  Tobacco Use: Medium Risk (01/12/2023)    Readmission Risk Interventions    01/12/2023   12:48 PM  Readmission Risk Prevention Plan  Medication Screening Complete  Transportation Screening Complete

## 2023-01-15 DIAGNOSIS — J189 Pneumonia, unspecified organism: Secondary | ICD-10-CM | POA: Diagnosis not present

## 2023-01-15 LAB — GLUCOSE, CAPILLARY
Glucose-Capillary: 94 mg/dL (ref 70–99)
Glucose-Capillary: 124 mg/dL — ABNORMAL HIGH (ref 70–99)
Glucose-Capillary: 152 mg/dL — ABNORMAL HIGH (ref 70–99)
Glucose-Capillary: 133 mg/dL — ABNORMAL HIGH (ref 70–99)

## 2023-01-15 LAB — CBC
HCT: 36.4 % (ref 36.0–46.0)
Hemoglobin: 11.1 g/dL — ABNORMAL LOW (ref 12.0–15.0)
MCH: 27.5 pg (ref 26.0–34.0)
MCHC: 30.5 g/dL (ref 30.0–36.0)
MCV: 90.1 fL (ref 80.0–100.0)
Platelets: 230 10*3/uL (ref 150–400)
RBC: 4.04 MIL/uL (ref 3.87–5.11)
RDW: 15.3 % (ref 11.5–15.5)
WBC: 7 10*3/uL (ref 4.0–10.5)
nRBC: 0 % (ref 0.0–0.2)

## 2023-01-15 LAB — BASIC METABOLIC PANEL
Anion gap: 9 (ref 5–15)
BUN: 29 mg/dL — ABNORMAL HIGH (ref 8–23)
CO2: 30 mmol/L (ref 22–32)
Calcium: 9.3 mg/dL (ref 8.9–10.3)
Chloride: 99 mmol/L (ref 98–111)
Creatinine, Ser: 0.76 mg/dL (ref 0.44–1.00)
GFR, Estimated: >60 mL/min (ref >60)
Glucose, Bld: 96 mg/dL (ref 70–99)
Potassium: 4.2 mmol/L (ref 3.5–5.1)
Sodium: 138 mmol/L (ref 135–145)

## 2023-01-15 MED ORDER — IPRATROPIUM BROMIDE 0.02 % IN SOLN
0.5000 mg | RESPIRATORY_TRACT | Status: DC | PRN
  Administered 2023-01-15 – 2023-01-21 (×17): 0.5 mg via RESPIRATORY_TRACT
  Filled 2023-01-15 (×17): qty 2.5

## 2023-01-15 MED ORDER — AMLODIPINE BESYLATE 5 MG PO TABS
5.0000 mg | ORAL_TABLET | Freq: Every day | ORAL | Status: DC
  Administered 2023-01-15 – 2023-01-21 (×7): 5 mg via ORAL
  Filled 2023-01-15 (×7): qty 1

## 2023-01-15 MED ORDER — LOSARTAN POTASSIUM 50 MG PO TABS
100.0000 mg | ORAL_TABLET | Freq: Every day | ORAL | Status: DC
  Administered 2023-01-15 – 2023-01-21 (×7): 100 mg via ORAL
  Filled 2023-01-15 (×8): qty 2

## 2023-01-15 NOTE — TOC Progression Note (Signed)
Transition of Care [facility]) - Progression Note    Patient Details  Name: Victoria Dean MRN: [id number] Date of Birth: [birth year]  Transition of Care [facility]) CM/SW Contact  Elliot Gault, LCSW Phone Number: 01/15/2023, 3:05 PM  Clinical Narrative:     TOC following. Discussed dc planning with pt/dtr today. Pt has elected to dc to an extended family member's home at dc and she would like HH therapy from [facility]. Pt also states that if she needs O2 at dc, she would like it arranged with [facility].  Pt going to the home of Mont Dutton at [employer] in [place]. MD anticipating dc tomorrow.  Referral given to [facility]. Information added to AVS.  TOC will follow up in AM.  Expected Discharge Plan: Home w Home Health Services Barriers to Discharge: Continued Medical Work up  Expected Discharge Plan and Services In-house Referral: Clinical Social Work Discharge Planning Services: CM Consult Post Acute Care Choice: Home Health Living arrangements for the past 2 months: Single Family Home                           HH Arranged: PT, OT HH Agency: [facility] Date [facility] Agency Contacted: 01/15/23   Representative spoke with at [facility] Agency: Kandee Keen   Social Determinants of Health (SDOH) Interventions SDOH Screenings   Food Insecurity: No Food Insecurity (01/12/2023)  Housing: Low Risk  (01/12/2023)  Transportation Needs: No Transportation Needs (01/12/2023)  Utilities: Not At Risk (01/12/2023)  Alcohol Screen: Low Risk  (10/23/2021)  Depression (PHQ2-9): Low Risk  (10/23/2021)  Financial Resource Strain: Low Risk  (10/23/2021)  Physical Activity: Inactive (02/16/2018)  Social Connections: Moderately Isolated (10/23/2021)  Stress: No Stress Concern Present (02/16/2018)  Tobacco Use: Medium Risk (01/12/2023)    Readmission Risk Interventions    01/12/2023   12:48 PM  Readmission Risk Prevention Plan  Medication Screening Complete  Transportation Screening Complete

## 2023-01-15 NOTE — Progress Notes (Addendum)
PROGRESS NOTE    Victoria Dean  UXL:[id number] DOB: 1950-06-13 DOA: 01/12/2023 PCP: Oneal Grout, FNP   Brief Narrative:    Victoria Dean is a 72 y.o. female with medical history significant for breast cancer on letrozole, chronic anxiety/depression, hypertension, hyperlipidemia, GERD, severe morbid obesity, who initially presented to [facility] ED where she left AMA due to lengthy wait time.  Has been having progressively worsening shortness of breath, nonproductive cough, and palpitations for the last several days.  No reported subjective fevers or chills.  She was admitted with acute hypoxemic respiratory failure secondary to parainfluenza infection along with multifocal community-acquired pneumonia.  Working on oxygen weaning and if able to wean off anticipating discharge in the next 24 hours.  Assessment & Plan:   Principal Problem:   CAP (community acquired pneumonia)  Assessment and Plan:   Acute hypoxic respiratory failure secondary pneumonia/parainfluenza   -Still having significant shortness of breath, especially with exertion, improved diffuse wheezing and rhonchi Still having pursed lip breathing -Was on 4 L of oxygen, wean down to 2 L this morning, satting 95% Respiratory panel Virus panel positive for parainfluenza virus 4 - Not on oxygen supplementation at baseline -at home -Scheduling DuoNeb bronchodilators, adding Pulmicort nebs -Continue IV steroids -with slow taper -Lasix 40 mg x 1 01/13/23 -Continue treating underlying pneumonia -Continue mucolytics, spirometer,   Multifocal pneumonia, POA -Likely superimposed bilateral infection -With acute respiratory failure, still was requiring 4 L of oxygen this morning down to 2 L, satting 95%  -Continue to have shortness of breath, diffuse wheezes and rhonchi   -CTA-negative for pulmonary embolism -Continue current antibiotics of IV Rocephin and azithromycin   - As needed bronchodilators - Antitussives as needed      Elevated troponin, suspect demand ischemia in the setting of acute hypoxic respiratory failure -Denies any chest pain - High-sensitivity troponin peaked at 27 and downtrending. - Last 2D echo done on 06/29/2022 revealed LVEF 60 to 65% with no regional wall motion abnormalities. - Monitor on telemetry   Chronic anxiety/depression Resume home regimen. -Added as needed Xanax   Hyperlipidemia Continue statins   GERD Continue PPI   Generalized weakness PT OT assessment recommending SNF, patient deciding on home health versus SNF. Fall precautions.   Hyperglycemia Last hemoglobin A1c 6.1 on 10/22/2021 Repeat hemoglobin A1c Start insulin sliding scale   Sinus tachycardia, suspect secondary to hypovolemia Monitoring, status post IV fluid resuscitation -Worsening tachycardia with breathing treatments, anxiety  Morbid obesity class III BMI 43.4    DVT prophylaxis: SCDs Code Status: Full Family Communication: None at bedside Disposition Plan:  Status is: Inpatient Remains inpatient appropriate because: Need for IV medications  Consultants:  None  Procedures:  None  Antimicrobials:  Anti-infectives (From admission, onward)    Start     Dose/Rate Route Frequency Ordered Stop   01/13/23 0600  cefTRIAXone (ROCEPHIN) 2 g in sodium chloride 0.9 % 100 mL IVPB        2 g 200 mL/hr over 30 Minutes Intravenous Every 24 hours 01/12/23 0632     01/13/23 0500  azithromycin (ZITHROMAX) 500 mg in sodium chloride 0.9 % 250 mL IVPB  Status:  Discontinued        500 mg 250 mL/hr over 60 Minutes Intravenous Every 24 hours 01/12/23 0632 01/12/23 0704   01/12/23 1000  azithromycin (ZITHROMAX) tablet 500 mg        500 mg Oral Daily 01/12/23 0704     01/12/23 0445  cefTRIAXone (ROCEPHIN) 2  g in sodium chloride 0.9 % 100 mL IVPB        2 g 200 mL/hr over 30 Minutes Intravenous  Once 01/12/23 0439 01/12/23 0641   01/12/23 0445  azithromycin (ZITHROMAX) 500 mg in sodium chloride 0.9 % 250 mL  IVPB  Status:  Discontinued        500 mg 250 mL/hr over 60 Minutes Intravenous Every 24 hours 01/12/23 0439 01/12/23 1610      Subjective: Patient seen and evaluated today with ongoing shortness of breath along with some coughing and congestion.  Objective: Vitals:   01/15/23 0453 01/15/23 0734 01/15/23 0735 01/15/23 0836  BP: (!) 154/81   (!) 154/76  Pulse: 86   90  Resp: 18     Temp: 98.1 F (36.7 C)     TempSrc:      SpO2: 98% 97% 97% 98%  Weight:      Height:        Intake/Output Summary (Last 24 hours) at 01/15/2023 1020 Last data filed at 01/15/2023 0300 Gross per 24 hour  Intake 600 ml  Output --  Net 600 ml   Filed Weights   01/12/23 0217  Weight: 111.1 kg    Examination:  General exam: Appears calm and comfortable  Respiratory system: Clear to auscultation. Respiratory effort normal.  2 L nasal cannula Cardiovascular system: S1 & S2 heard, RRR.  Gastrointestinal system: Abdomen is soft Central nervous system: Alert and awake Extremities: No edema Skin: No significant lesions noted Psychiatry: Flat affect.    Data Reviewed: I have personally reviewed following labs and imaging studies  CBC: Recent Labs  Lab 01/11/23 1426 01/12/23 0300 01/13/23 0430 01/14/23 0424 01/15/23 0429  WBC 7.1 7.7 13.1* 7.8 7.0  HGB 11.9* 11.5* 11.8* 11.7* 11.1*  HCT 37.4 35.7* 37.2 37.7 36.4  MCV 88.6 88.6 90.3 92.2 90.1  PLT 222 204 237 229 230   Basic Metabolic Panel: Recent Labs  Lab 01/11/23 1426 01/12/23 0300 01/13/23 0430 01/14/23 0424 01/15/23 0429  NA 133* 136 139 140 138  K 3.6 3.7 4.0 4.4 4.2  CL 98 100 105 101 99  CO2 26 26 25 29 30   GLUCOSE 104* 140* 122* 98 96  BUN 17 16 21  33* 29*  CREATININE 0.87 0.85 0.80 0.86 0.76  CALCIUM 8.7* 9.1 9.1 9.0 9.3   GFR: Estimated Creatinine Clearance: 73.9 mL/min (by C-G formula based on SCr of 0.76 mg/dL). Liver Function Tests: Recent Labs  Lab 01/12/23 0300  AST 21  ALT 28  ALKPHOS 113  BILITOT  0.5  PROT 7.0  ALBUMIN 3.7   No results for input(s): "LIPASE", "AMYLASE" in the last 168 hours. No results for input(s): "AMMONIA" in the last 168 hours. Coagulation Profile: No results for input(s): "INR", "PROTIME" in the last 168 hours. Cardiac Enzymes: No results for input(s): "CKTOTAL", "CKMB", "CKMBINDEX", "TROPONINI" in the last 168 hours. BNP (last 3 results) No results for input(s): "PROBNP" in the last 8760 hours. HbA1C: No results for input(s): "HGBA1C" in the last 72 hours. CBG: Recent Labs  Lab 01/14/23 0755 01/14/23 1116 01/14/23 1648 01/14/23 2104 01/15/23 0723  GLUCAP 94 97 195* 113* 94   Lipid Profile: No results for input(s): "CHOL", "HDL", "LDLCALC", "TRIG", "CHOLHDL", "LDLDIRECT" in the last 72 hours. Thyroid Function Tests: No results for input(s): "TSH", "T4TOTAL", "FREET4", "T3FREE", "THYROIDAB" in the last 72 hours. Anemia Panel: No results for input(s): "VITAMINB12", "FOLATE", "FERRITIN", "TIBC", "IRON", "RETICCTPCT" in the last 72 hours. Sepsis Labs: Recent  Labs  Lab 01/12/23 0419  PROCALCITON <0.10    Recent Results (from the past 240 hours)  Respiratory (~20 pathogens) panel by PCR     Status: Abnormal   Collection Time: 01/13/23  1:32 PM   Specimen: Nasopharyngeal Swab; Respiratory  Result Value Ref Range Status   Adenovirus NOT DETECTED NOT DETECTED Final   Coronavirus 229E NOT DETECTED NOT DETECTED Final    Comment: (NOTE) The Coronavirus on the Respiratory Panel, DOES NOT test for the novel  Coronavirus (2019 nCoV)    Coronavirus HKU1 NOT DETECTED NOT DETECTED Final   Coronavirus NL63 NOT DETECTED NOT DETECTED Final   Coronavirus OC43 NOT DETECTED NOT DETECTED Final   Metapneumovirus NOT DETECTED NOT DETECTED Final   Rhinovirus / Enterovirus NOT DETECTED NOT DETECTED Final   Influenza A NOT DETECTED NOT DETECTED Final   Influenza B NOT DETECTED NOT DETECTED Final   Parainfluenza Virus 1 NOT DETECTED NOT DETECTED Final    Parainfluenza Virus 2 NOT DETECTED NOT DETECTED Final   Parainfluenza Virus 3 NOT DETECTED NOT DETECTED Final   Parainfluenza Virus 4 DETECTED (A) NOT DETECTED Final   Respiratory Syncytial Virus NOT DETECTED NOT DETECTED Final   Bordetella pertussis NOT DETECTED NOT DETECTED Final   Bordetella Parapertussis NOT DETECTED NOT DETECTED Final   Chlamydophila pneumoniae NOT DETECTED NOT DETECTED Final   Mycoplasma pneumoniae NOT DETECTED NOT DETECTED Final    Comment: Performed at [facility], [address] [place], [place] [address]  SARS Coronavirus 2 by RT PCR (hospital order, performed in [facility] hospital lab) *cepheid single result test* Anterior Nasal Swab     Status: None   Collection Time: 01/14/23  9:50 AM   Specimen: Anterior Nasal Swab  Result Value Ref Range Status   SARS Coronavirus 2 by RT PCR NEGATIVE NEGATIVE Final    Comment: (NOTE) SARS-CoV-2 target nucleic acids are NOT DETECTED.  The SARS-CoV-2 RNA is generally detectable in upper and lower respiratory specimens during the acute phase of infection. The lowest concentration of SARS-CoV-2 viral copies this assay can detect is 250 copies / mL. A negative result does not preclude SARS-CoV-2 infection and should not be used as the sole basis for treatment or other patient management decisions.  A negative result may occur with improper specimen collection / handling, submission of specimen other than nasopharyngeal swab, presence of viral mutation(s) within the areas targeted by this assay, and inadequate number of viral copies (<250 copies / mL). A negative result must be combined with clinical observations, patient history, and epidemiological information.  Fact Sheet for Patients:   RoadLapTop.co.za  Fact Sheet for Healthcare Providers: http://kim-miller.com/  This test is not yet approved or  cleared by the [place] FDA and has been authorized for  detection and/or diagnosis of SARS-CoV-2 by FDA under an Emergency Use Authorization (EUA).  This EUA will remain in effect (meaning this test can be used) for the duration of the COVID-19 declaration under Section 564(b)(1) of the Act, 21 U.S.C. section 360bbb-3(b)(1), unless the authorization is terminated or revoked sooner.  Performed at [facility], [address] [place], [place] [address]          Radiology Studies: No results found.      Scheduled Meds:  amLODipine  5 mg Oral Daily   aspirin EC  81 mg Oral Daily   azithromycin  500 mg Oral Daily   budesonide (PULMICORT) nebulizer solution  0.5 mg Nebulization BID   cholecalciferol  1,000 Units Oral Daily   [START ON 02/06/2023] cyanocobalamin  1,000 mcg Intramuscular Q30 days   famotidine  20 mg Oral Daily   guaiFENesin-dextromethorphan  10 mL Oral Q8H   insulin aspart  0-5 Units Subcutaneous QHS   insulin aspart  0-9 Units Subcutaneous TID WC   letrozole  2.5 mg Oral Daily   levalbuterol  1.25 mg Nebulization Q6H   losartan  100 mg Oral Daily   methylPREDNISolone sodium succinate  60 mg Intravenous Q24H   rosuvastatin  5 mg Oral Daily   venlafaxine XR  75 mg Oral QHS   Continuous Infusions:  cefTRIAXone (ROCEPHIN)  IV Stopped (01/15/23 8295)     LOS: 3 days    Time spent: 35 minutes    Georjean Toya Hoover Brunette, DO [facility]  If 7PM-7AM, please contact night-coverage [email or web address] 01/15/2023, 10:20 AM

## 2023-01-15 NOTE — Plan of Care (Signed)
  Problem: Nutrition: Goal: Adequate nutrition will be maintained Outcome: Progressing   Problem: Coping: Goal: Level of anxiety will decrease Outcome: Progressing   Problem: Elimination: Goal: Will not experience complications related to bowel motility Outcome: Progressing   

## 2023-01-15 NOTE — Progress Notes (Signed)
Mobility Specialist Progress Note:    01/15/23 0950  Mobility  Activity Ambulated with assistance in room;Ambulated with assistance to bathroom  Level of Assistance Standby assist, set-up cues, supervision of patient - no hands on  Assistive Device [employer] Ambulated (ft) 35 ft  Range of Motion/Exercises Active;All extremities  Activity Response Tolerated well  Mobility Referral Yes  Mobility visit 1 Mobility  Mobility Specialist Start Time (ACUTE ONLY) 0935  Mobility Specialist Stop Time (ACUTE ONLY) 0950  Mobility Specialist Time Calculation (min) (ACUTE ONLY) 15 min   Pt received in bed, agreeable to mobility. Required supervision to stand and ambulate with cane. Tolerated well, SpO2 89% on 2L after session, quickly recovered SpO2 94% on 2L. Returned pt supine, all needs met.   Lawerance Bach Mobility Specialist Please contact via Special educational needs teacher or  Rehab office at [phone]

## 2023-01-15 NOTE — Progress Notes (Signed)
Physical Therapy Treatment Patient Details Name: Victoria Dean MRN: [id number] DOB: 08-19-1950 Today's Date: 01/15/2023   History of Present Illness Victoria Dean is a 72 y.o. female with medical history significant for breast cancer on letrozole, chronic anxiety/depression, hypertension, hyperlipidemia, GERD, severe morbid obesity, who initially presented to [facility] ED where she left AMA due to lengthy wait time.  Has been having progressively worsening shortness of breath, nonproductive cough, and palpitations for the last several days.  No reported subjective fevers or chills.     In the ED, tachycardic and tachypneic with new oxygen requirement.  BP 125/68, pulse 108, respiration rate 25, O2 saturation 92% on 4 L.       CT angio chest was negative for pulmonary embolism however showed bronchitis with patchy areas of bronchiolitis/bronchopneumonia in the bilateral lungs.  The patient was started on empiric IV antibiotic Rocephin and azithromycin.  Admitted by [facility], hospitalist service.    PT Comments  PT limitation of mobility is respiratory not physical.  PT able to get out and into bed I, stand and transfer I. And ambulate with supervision.  O2 decreases from 91 to 86 with 25 ft of ambulation.   Pt does not want SNF   If plan is discharge home, recommend the following: A little help with walking and/or transfers;A little help with bathing/dressing/bathroom;Assistance with cooking/housework;Assist for transportation;Help with stairs or ramp for entrance   Can travel by private vehicle     Yes  Equipment Recommendations  Rolling walker (2 wheels)    Recommendations for Other Services  OT      Precautions / Restrictions Precautions Precautions: Fall Restrictions Weight Bearing Restrictions Per Provider Order: No     Mobility  Bed Mobility Overal bed mobility: Modified Independent Bed Mobility: Supine to Sit     Supine to sit: Modified independent (Device/Increase time), HOB elevated      General bed mobility comments: increased time, labored movement    Transfers Overall transfer level: Modified independent Equipment used: Rolling walker (2 wheels) Transfers: Sit to/from Stand Sit to Stand: Modified independent (Device/Increase time)   Step pivot transfers: Supervision            Ambulation/Gait Ambulation/Gait assistance: Supervision Gait Distance (Feet): 25 Feet Assistive device: Rolling walker (2 wheels)   Gait velocity: slow     General Gait Details: O2 drops from 91 to 85 with ambulation x 25 ft witn O2 on        Cognition Arousal: Alert Behavior During Therapy: WFL for tasks assessed/performed Overall Cognitive Status: Within Functional Limits for tasks assessed                                                 Pertinent Vitals/Pain Pain Assessment Pain Assessment: No/denies pain    Home Living  Lives alone has a cane for ambulation                         Prior Function     Mod I shopped, drove, cooked       PT Goals (current goals can now be found in the care plan section) Acute Rehab PT Goals Patient Stated Goal: return home PT Goal Formulation: With patient Potential to Achieve Goals: Good Progress towards PT goals: Progressing toward goals    Frequency    Min  3X/week             End of Session Equipment Utilized During Treatment: Oxygen Activity Tolerance: Patient limited by fatigue Patient left: in bed (PT states chair is to uncomfortable, HOB raised) Nurse Communication: Mobility status PT Visit Diagnosis: Unsteadiness on feet (R26.81);Other abnormalities of gait and mobility (R26.89);Muscle weakness (generalized) (M62.81)     Time: 1036-1100 PT Time Calculation (min) (ACUTE ONLY): 24 min  Charges:    $Gait Training: 23-37 mins PT General Charges $$ ACUTE PT VISIT: 1 Visit                      Virgina Organ, PT CLT [phone]  01/15/2023, 11:04 AM

## 2023-01-15 NOTE — Plan of Care (Signed)
  Problem: Education: Goal: Knowledge of General Education information will improve Description: Including pain rating scale, medication(s)/side effects and non-pharmacologic comfort measures Outcome: Progressing   Problem: Health Behavior/Discharge Planning: Goal: Ability to manage health-related needs will improve Outcome: Progressing   Problem: Clinical Measurements: Goal: Ability to maintain clinical measurements within normal limits will improve Outcome: Progressing Goal: Respiratory complications will improve Outcome: Not Met (add Reason)

## 2023-01-16 DIAGNOSIS — J189 Pneumonia, unspecified organism: Secondary | ICD-10-CM | POA: Diagnosis not present

## 2023-01-16 LAB — CBC
HCT: 34.7 % — ABNORMAL LOW (ref 36.0–46.0)
Hemoglobin: 11.1 g/dL — ABNORMAL LOW (ref 12.0–15.0)
MCH: 28.6 pg (ref 26.0–34.0)
MCHC: 32 g/dL (ref 30.0–36.0)
MCV: 89.4 fL (ref 80.0–100.0)
Platelets: 235 10*3/uL (ref 150–400)
RBC: 3.88 MIL/uL (ref 3.87–5.11)
RDW: 15.4 % (ref 11.5–15.5)
WBC: 7.4 10*3/uL (ref 4.0–10.5)
nRBC: 0 % (ref 0.0–0.2)

## 2023-01-16 LAB — GLUCOSE, CAPILLARY
Glucose-Capillary: 87 mg/dL (ref 70–99)
Glucose-Capillary: 92 mg/dL (ref 70–99)
Glucose-Capillary: 140 mg/dL — ABNORMAL HIGH (ref 70–99)
Glucose-Capillary: 154 mg/dL — ABNORMAL HIGH (ref 70–99)

## 2023-01-16 LAB — MAGNESIUM: Magnesium: 2.5 mg/dL — ABNORMAL HIGH (ref 1.7–2.4)

## 2023-01-16 LAB — BASIC METABOLIC PANEL
Anion gap: 10 (ref 5–15)
BUN: 27 mg/dL — ABNORMAL HIGH (ref 8–23)
CO2: 30 mmol/L (ref 22–32)
Calcium: 9.2 mg/dL (ref 8.9–10.3)
Chloride: 99 mmol/L (ref 98–111)
Creatinine, Ser: 0.83 mg/dL (ref 0.44–1.00)
GFR, Estimated: >60 mL/min (ref >60)
Glucose, Bld: 91 mg/dL (ref 70–99)
Potassium: 4.1 mmol/L (ref 3.5–5.1)
Sodium: 139 mmol/L (ref 135–145)

## 2023-01-16 MED ORDER — ACETYLCYSTEINE 20 % IN SOLN
4.0000 mL | Freq: Three times a day (TID) | RESPIRATORY_TRACT | Status: DC
Start: 2023-01-16 — End: 2023-01-21
  Administered 2023-01-17 – 2023-01-21 (×11): 4 mL via RESPIRATORY_TRACT
  Filled 2023-01-16 (×27): qty 4

## 2023-01-16 MED ORDER — METHYLPREDNISOLONE SODIUM SUCC 125 MG IJ SOLR
60.0000 mg | Freq: Two times a day (BID) | INTRAMUSCULAR | Status: DC
  Administered 2023-01-16 – 2023-01-18 (×4): 60 mg via INTRAVENOUS
  Filled 2023-01-16 (×4): qty 2

## 2023-01-16 NOTE — Plan of Care (Signed)

## 2023-01-16 NOTE — Care Management Important Message (Signed)
Important Message  Patient Details  Name: Victoria Dean MRN: [id number] Date of Birth: May 18, 1950   Important Message Given:  Yes - Medicare IM (reviewed letter with patient by phone, no additional copy needed)     Corey Harold 01/16/2023, 12:44 PM

## 2023-01-16 NOTE — Progress Notes (Signed)
PROGRESS NOTE    Victoria Dean  UJW:[id number] DOB: [birth year] DOA: 01/12/2023 PCP: Oneal Grout, FNP   Brief Narrative:    Victoria Dean is a 72 y.o. female with medical history significant for breast cancer on letrozole, chronic anxiety/depression, hypertension, hyperlipidemia, GERD, severe morbid obesity, who initially presented to [facility] ED where she left AMA due to lengthy wait time.  Has been having progressively worsening shortness of breath, nonproductive cough, and palpitations for the last several days.  No reported subjective fevers or chills.  She was admitted with acute hypoxemic respiratory failure secondary to parainfluenza infection along with multifocal community-acquired pneumonia.  Working on oxygen weaning and if able to wean off anticipating discharge in the next 24 hours.  Assessment & Plan:   Principal Problem:   CAP (community acquired pneumonia)  Assessment and Plan:   Acute hypoxic respiratory failure secondary pneumonia/parainfluenza   -Still having significant shortness of breath, especially with exertion, improved diffuse wheezing and rhonchi Still having pursed lip breathing -Was on 4 L of oxygen, wean down to 2 L this morning, satting 95% Respiratory panel Virus panel positive for parainfluenza virus 4 - Not on oxygen supplementation at baseline -at home -Scheduling DuoNeb bronchodilators, adding Pulmicort nebs -Continue IV steroids -with slow taper -Lasix 40 mg x 1 01/13/23 -Continue treating underlying pneumonia -Continue mucolytics, spirometer, and now flutter valve and mucomyst   Multifocal pneumonia, POA -Likely superimposed bilateral infection -With acute respiratory failure, still was requiring 4 L of oxygen this morning down to 2 L, satting 95%  -Continue to have shortness of breath, diffuse wheezes and rhonchi   -CTA-negative for pulmonary embolism -Continue current antibiotics of IV Rocephin and azithromycin   - As needed  bronchodilators - Antitussives as needed     Elevated troponin, suspect demand ischemia in the setting of acute hypoxic respiratory failure -Denies any chest pain - High-sensitivity troponin peaked at 27 and downtrending. - Last 2D echo done on 06/29/2022 revealed LVEF 60 to 65% with no regional wall motion abnormalities. - Monitor on telemetry   Chronic anxiety/depression Resume home regimen. -Added as needed Xanax   Hyperlipidemia Continue statins   GERD Continue PPI   Generalized weakness PT OT assessment recommending SNF, patient deciding on home health versus SNF. Fall precautions.   Hyperglycemia Last hemoglobin A1c 6.1 on 10/22/2021 Repeat hemoglobin A1c Start insulin sliding scale   Sinus tachycardia, suspect secondary to hypovolemia Monitoring, status post IV fluid resuscitation -Worsening tachycardia with breathing treatments, anxiety  Morbid obesity class III BMI 43.4    DVT prophylaxis: SCDs Code Status: Full Family Communication: None at bedside Disposition Plan:  Status is: Inpatient Remains inpatient appropriate because: Need for IV medications  Consultants:  None  Procedures:  None  Antimicrobials:  Anti-infectives (From admission, onward)    Start     Dose/Rate Route Frequency Ordered Stop   01/13/23 0600  cefTRIAXone (ROCEPHIN) 2 g in sodium chloride 0.9 % 100 mL IVPB        2 g 200 mL/hr over 30 Minutes Intravenous Every 24 hours 01/12/23 0632     01/13/23 0500  azithromycin (ZITHROMAX) 500 mg in sodium chloride 0.9 % 250 mL IVPB  Status:  Discontinued        500 mg 250 mL/hr over 60 Minutes Intravenous Every 24 hours 01/12/23 0632 01/12/23 0704   01/12/23 1000  azithromycin (ZITHROMAX) tablet 500 mg        500 mg Oral Daily 01/12/23 0704  01/12/23 0445  cefTRIAXone (ROCEPHIN) 2 g in sodium chloride 0.9 % 100 mL IVPB        2 g 200 mL/hr over 30 Minutes Intravenous  Once 01/12/23 0439 01/12/23 0641   01/12/23 0445  azithromycin  (ZITHROMAX) 500 mg in sodium chloride 0.9 % 250 mL IVPB  Status:  Discontinued        500 mg 250 mL/hr over 60 Minutes Intravenous Every 24 hours 01/12/23 0439 01/12/23 1610      Subjective: Patient seen and evaluated today with ongoing shortness of breath along with some coughing and congestion that has not improved over the last 24 hours.  Objective: Vitals:   01/16/23 0706 01/16/23 0816 01/16/23 0951 01/16/23 1004  BP:   134/70 134/70  Pulse:   86   Resp:      Temp:      TempSrc:      SpO2: 97% 94% 97%   Weight:      Height:        Intake/Output Summary (Last 24 hours) at 01/16/2023 1120 Last data filed at 01/16/2023 0900 Gross per 24 hour  Intake 480 ml  Output --  Net 480 ml   Filed Weights   01/12/23 0217  Weight: 111.1 kg    Examination:  General exam: Appears calm and comfortable  Respiratory system: Clear to auscultation. Respiratory effort normal.  2 L nasal cannula Cardiovascular system: S1 & S2 heard, RRR.  Gastrointestinal system: Abdomen is soft Central nervous system: Alert and awake Extremities: No edema Skin: No significant lesions noted Psychiatry: Flat affect.    Data Reviewed: I have personally reviewed following labs and imaging studies  CBC: Recent Labs  Lab 01/12/23 0300 01/13/23 0430 01/14/23 0424 01/15/23 0429 01/16/23 0422  WBC 7.7 13.1* 7.8 7.0 7.4  HGB 11.5* 11.8* 11.7* 11.1* 11.1*  HCT 35.7* 37.2 37.7 36.4 34.7*  MCV 88.6 90.3 92.2 90.1 89.4  PLT 204 237 229 230 235   Basic Metabolic Panel: Recent Labs  Lab 01/12/23 0300 01/13/23 0430 01/14/23 0424 01/15/23 0429 01/16/23 0422  NA 136 139 140 138 139  K 3.7 4.0 4.4 4.2 4.1  CL 100 105 101 99 99  CO2 26 25 29 30 30   GLUCOSE 140* 122* 98 96 91  BUN 16 21 33* 29* 27*  CREATININE 0.85 0.80 0.86 0.76 0.83  CALCIUM 9.1 9.1 9.0 9.3 9.2  MG  --   --   --   --  2.5*   GFR: Estimated Creatinine Clearance: 71.3 mL/min (by C-G formula based on SCr of 0.83 mg/dL). Liver  Function Tests: Recent Labs  Lab 01/12/23 0300  AST 21  ALT 28  ALKPHOS 113  BILITOT 0.5  PROT 7.0  ALBUMIN 3.7   No results for input(s): "LIPASE", "AMYLASE" in the last 168 hours. No results for input(s): "AMMONIA" in the last 168 hours. Coagulation Profile: No results for input(s): "INR", "PROTIME" in the last 168 hours. Cardiac Enzymes: No results for input(s): "CKTOTAL", "CKMB", "CKMBINDEX", "TROPONINI" in the last 168 hours. BNP (last 3 results) No results for input(s): "PROBNP" in the last 8760 hours. HbA1C: No results for input(s): "HGBA1C" in the last 72 hours. CBG: Recent Labs  Lab 01/15/23 0723 01/15/23 1115 01/15/23 1655 01/15/23 2119 01/16/23 0753  GLUCAP 94 124* 152* 133* 87   Lipid Profile: No results for input(s): "CHOL", "HDL", "LDLCALC", "TRIG", "CHOLHDL", "LDLDIRECT" in the last 72 hours. Thyroid Function Tests: No results for input(s): "TSH", "T4TOTAL", "FREET4", "T3FREE", "THYROIDAB"  in the last 72 hours. Anemia Panel: No results for input(s): "VITAMINB12", "FOLATE", "FERRITIN", "TIBC", "IRON", "RETICCTPCT" in the last 72 hours. Sepsis Labs: Recent Labs  Lab 01/12/23 0419  PROCALCITON <0.10    Recent Results (from the past 240 hours)  Respiratory (~20 pathogens) panel by PCR     Status: Abnormal   Collection Time: 01/13/23  1:32 PM   Specimen: Nasopharyngeal Swab; Respiratory  Result Value Ref Range Status   Adenovirus NOT DETECTED NOT DETECTED Final   Coronavirus 229E NOT DETECTED NOT DETECTED Final    Comment: (NOTE) The Coronavirus on the Respiratory Panel, DOES NOT test for the novel  Coronavirus (2019 nCoV)    Coronavirus HKU1 NOT DETECTED NOT DETECTED Final   Coronavirus NL63 NOT DETECTED NOT DETECTED Final   Coronavirus OC43 NOT DETECTED NOT DETECTED Final   Metapneumovirus NOT DETECTED NOT DETECTED Final   Rhinovirus / Enterovirus NOT DETECTED NOT DETECTED Final   Influenza A NOT DETECTED NOT DETECTED Final   Influenza B NOT  DETECTED NOT DETECTED Final   Parainfluenza Virus 1 NOT DETECTED NOT DETECTED Final   Parainfluenza Virus 2 NOT DETECTED NOT DETECTED Final   Parainfluenza Virus 3 NOT DETECTED NOT DETECTED Final   Parainfluenza Virus 4 DETECTED (A) NOT DETECTED Final   Respiratory Syncytial Virus NOT DETECTED NOT DETECTED Final   Bordetella pertussis NOT DETECTED NOT DETECTED Final   Bordetella Parapertussis NOT DETECTED NOT DETECTED Final   Chlamydophila pneumoniae NOT DETECTED NOT DETECTED Final   Mycoplasma pneumoniae NOT DETECTED NOT DETECTED Final    Comment: Performed at [facility], [address] [place], [place] [address]  SARS Coronavirus 2 by RT PCR (hospital order, performed in [facility] hospital lab) *cepheid single result test* Anterior Nasal Swab     Status: None   Collection Time: 01/14/23  9:50 AM   Specimen: Anterior Nasal Swab  Result Value Ref Range Status   SARS Coronavirus 2 by RT PCR NEGATIVE NEGATIVE Final    Comment: (NOTE) SARS-CoV-2 target nucleic acids are NOT DETECTED.  The SARS-CoV-2 RNA is generally detectable in upper and lower respiratory specimens during the acute phase of infection. The lowest concentration of SARS-CoV-2 viral copies this assay can detect is 250 copies / mL. A negative result does not preclude SARS-CoV-2 infection and should not be used as the sole basis for treatment or other patient management decisions.  A negative result may occur with improper specimen collection / handling, submission of specimen other than nasopharyngeal swab, presence of viral mutation(s) within the areas targeted by this assay, and inadequate number of viral copies (<250 copies / mL). A negative result must be combined with clinical observations, patient history, and epidemiological information.  Fact Sheet for Patients:   RoadLapTop.co.za  Fact Sheet for Healthcare Providers: http://kim-miller.com/  This test is  not yet approved or  cleared by the [place] FDA and has been authorized for detection and/or diagnosis of SARS-CoV-2 by FDA under an Emergency Use Authorization (EUA).  This EUA will remain in effect (meaning this test can be used) for the duration of the COVID-19 declaration under Section 564(b)(1) of the Act, 21 U.S.C. section 360bbb-3(b)(1), unless the authorization is terminated or revoked sooner.  Performed at [facility], [address] [place], [place] [address]          Radiology Studies: No results found.      Scheduled Meds:  acetylcysteine  4 mL Nebulization TID   amLODipine  5 mg Oral  Daily   aspirin EC  81 mg Oral Daily   azithromycin  500 mg Oral Daily   budesonide (PULMICORT) nebulizer solution  0.5 mg Nebulization BID   cholecalciferol  1,000 Units Oral Daily   [START ON 02/06/2023] cyanocobalamin  1,000 mcg Intramuscular Q30 days   famotidine  20 mg Oral Daily   guaiFENesin-dextromethorphan  10 mL Oral Q8H   insulin aspart  0-5 Units Subcutaneous QHS   insulin aspart  0-9 Units Subcutaneous TID WC   letrozole  2.5 mg Oral Daily   levalbuterol  1.25 mg Nebulization Q6H   losartan  100 mg Oral Daily   methylPREDNISolone sodium succinate  60 mg Intravenous Q12H   rosuvastatin  5 mg Oral Daily   venlafaxine XR  75 mg Oral QHS   Continuous Infusions:  cefTRIAXone (ROCEPHIN)  IV Stopped (01/16/23 0631)     LOS: 4 days    Time spent: 55 minutes    Alexxus Sobh D Sherryll Burger, DO [facility]  If 7PM-7AM, please contact night-coverage [email or web address] 01/16/2023, 11:20 AM

## 2023-01-16 NOTE — Plan of Care (Signed)
  Problem: Clinical Measurements: Goal: Respiratory complications will improve Outcome: Progressing   Problem: Activity: Goal: Risk for activity intolerance will decrease Outcome: Progressing   Problem: Nutrition: Goal: Adequate nutrition will be maintained Outcome: Progressing   

## 2023-01-16 NOTE — Progress Notes (Signed)
Physical Therapy Treatment Patient Details Name: Victoria Dean MRN: [id number] DOB: Jun 18, 1950 Today's Date: 01/16/2023   History of Present Illness Victoria Dean is a 72 y.o. female with medical history significant for breast cancer on letrozole, chronic anxiety/depression, hypertension, hyperlipidemia, GERD, severe morbid obesity, who initially presented to [facility] ED where she left AMA due to lengthy wait time.  Has been having progressively worsening shortness of breath, nonproductive cough, and palpitations for the last several days.  No reported subjective fevers or chills.     In the ED, tachycardic and tachypneic with new oxygen requirement.  BP 125/68, pulse 108, respiration rate 25, O2 saturation 92% on 4 L.       CT angio chest was negative for pulmonary embolism however showed bronchitis with patchy areas of bronchiolitis/bronchopneumonia in the bilateral lungs.  The patient was started on empiric IV antibiotic Rocephin and azithromycin.  Admitted by [facility], hospitalist service.    PT Comments  Pt sitting on EOB and willing to participate with PT today.  Trial on room air, able to complete seated therex with O2 saturation between 91-94%.  Upon standing O2 saturation dropped to 88% so resumed O2 A via nasal cannula during gait.  Pt able to ambulate with SPC with safe mechanics with supervision and no LOB, limited by fatigue and reports of increased LBP following gait.  EOS pt left sitting on EOB as reports chair is uncomfortable.  Call bell within reach.    If plan is discharge home, recommend the following:     Can travel by private vehicle        Equipment Recommendations       Recommendations for Other Services       Precautions / Restrictions       Mobility  Bed Mobility Overal bed mobility: Independent             General bed mobility comments: pt sitting on EOB upon therapist entrance    Transfers Overall transfer level: Modified independent Equipment used: Straight  cane Transfers: Sit to/from Stand Sit to Stand: Modified independent (Device/Increase time)           General transfer comment: Presents with safe mechanics, able to stand indepentently with SPC; Trial room air, dropped from 94 to 88, resumed 2L via nasal cannula with gait    Ambulation/Gait Ambulation/Gait assistance: Supervision Gait Distance (Feet): 60 Feet Assistive device: Straight cane Gait Pattern/deviations: Decreased step length - right, Decreased step length - left, Step-to pattern, Decreased stride length Gait velocity: slow     General Gait Details: O2 stayed at 91 during gait with 2L O2 via nasal cannula   Stairs             Wheelchair Mobility     Tilt Bed    Modified Rankin (Stroke Patients Only)       Balance                                            Cognition Arousal: Alert Behavior During Therapy: WFL for tasks assessed/performed Overall Cognitive Status: Within Functional Limits for tasks assessed                                          Exercises General Exercises - Lower Extremity  Long Arc Quad: AROM, 10 reps, Strengthening, Seated Toe Raises: AROM, Strengthening, Both, 10 reps, Seated Heel Raises: AROM, Both, 10 reps, Strengthening, Seated    General Comments        Pertinent Vitals/Pain      Home Living                          Prior Function            PT Goals (current goals can now be found in the care plan section)      Frequency           PT Plan      Co-evaluation              AM-PAC PT "6 Clicks" Mobility   Outcome Measure  Help needed turning from your back to your side while in a flat bed without using bedrails?: None Help needed moving from lying on your back to sitting on the side of a flat bed without using bedrails?: A Little Help needed moving to and from a bed to a chair (including a wheelchair)?: A Little Help needed standing up from a  chair using your arms (e.g., wheelchair or bedside chair)?: A Little Help needed to walk in hospital room?: A Little Help needed climbing 3-5 steps with a railing? : A Lot 6 Click Score: 18    End of Session Equipment Utilized During Treatment: Oxygen Activity Tolerance: Patient limited by fatigue Patient left: in bed (Pt stated chair is uncomfortable, left sitting on EOB) Nurse Communication: Mobility status PT Visit Diagnosis: Unsteadiness on feet (R26.81);Other abnormalities of gait and mobility (R26.89);Muscle weakness (generalized) (M62.81)     Time: 8469-6295 PT Time Calculation (min) (ACUTE ONLY): 19 min  Charges:    $Therapeutic Activity: 8-22 mins PT General Charges $$ ACUTE PT VISIT: 1 Visit                    Becky Sax, LPTA/CLT; CBIS [phone]  Juel Burrow 01/16/2023, 8:25 AM

## 2023-01-16 NOTE — Progress Notes (Signed)
Patient given flutter by RT and instructed on how to use it as well as how often. Patient expressed understanding.

## 2023-01-17 DIAGNOSIS — J189 Pneumonia, unspecified organism: Secondary | ICD-10-CM | POA: Diagnosis not present

## 2023-01-17 LAB — GLUCOSE, CAPILLARY
Glucose-Capillary: 97 mg/dL (ref 70–99)
Glucose-Capillary: 144 mg/dL — ABNORMAL HIGH (ref 70–99)
Glucose-Capillary: 113 mg/dL — ABNORMAL HIGH (ref 70–99)
Glucose-Capillary: 124 mg/dL — ABNORMAL HIGH (ref 70–99)

## 2023-01-17 NOTE — Progress Notes (Signed)
PROGRESS NOTE    Victoria Dean  QMV:[id number] DOB: Aug 08, 1950 DOA: 01/12/2023 PCP: Oneal Grout, FNP   Brief Narrative:    Victoria Dean is a 72 y.o. female with medical history significant for breast cancer on letrozole, chronic anxiety/depression, hypertension, hyperlipidemia, GERD, severe morbid obesity, who initially presented to [facility] ED where she left AMA due to lengthy wait time.  Has been having progressively worsening shortness of breath, nonproductive cough, and palpitations for the last several days.  No reported subjective fevers or chills.  She was admitted with acute hypoxemic respiratory failure secondary to parainfluenza infection along with multifocal community-acquired pneumonia.  Working on oxygen weaning and if able to wean off anticipating discharge in the next 24 hours if improved, but she is still struggling.  Assessment & Plan:   Principal Problem:   CAP (community acquired pneumonia)  Assessment and Plan:   Acute hypoxic respiratory failure secondary pneumonia/parainfluenza   -Still having significant shortness of breath, especially with exertion, improved diffuse wheezing and rhonchi Still having pursed lip breathing -Was on 4 L of oxygen, wean down to 2 L this morning, satting 95% Respiratory panel Virus panel positive for parainfluenza virus 4 - Not on oxygen supplementation at baseline -at home -Scheduling DuoNeb bronchodilators, adding Pulmicort nebs -Continue IV steroids -with slow taper -Lasix 40 mg x 1 01/13/23 -Continue treating underlying pneumonia -Continue mucolytics, spirometer, and now flutter valve and mucomyst   Multifocal pneumonia, POA -Likely superimposed bilateral infection -With acute respiratory failure, still was requiring 4 L of oxygen this morning down to 2 L, satting 95%  -Continue to have shortness of breath, diffuse wheezes and rhonchi   -CTA-negative for pulmonary embolism -Continue current antibiotics of IV Rocephin    -  As needed bronchodilators - Antitussives as needed     Elevated troponin, suspect demand ischemia in the setting of acute hypoxic respiratory failure -Denies any chest pain - High-sensitivity troponin peaked at 27 and downtrending. - Last 2D echo done on 06/29/2022 revealed LVEF 60 to 65% with no regional wall motion abnormalities. - Monitor on telemetry   Chronic anxiety/depression Resume home regimen. -Added as needed Xanax   Hyperlipidemia Continue statins   GERD Continue PPI   Generalized weakness PT OT assessment recommending SNF, but patient would like to go home with home health services and started   Hyperglycemia Last hemoglobin A1c 6.1 on 10/22/2021 Repeat hemoglobin A1c 5.8% Start insulin sliding scale   Sinus tachycardia, suspect secondary to hypovolemia Monitoring, status post IV fluid resuscitation -Worsening tachycardia with breathing treatments, anxiety  Morbid obesity class III BMI 43.4    DVT prophylaxis: SCDs Code Status: Full Family Communication: None at bedside Disposition Plan:  Status is: Inpatient Remains inpatient appropriate because: Need for IV medications  Consultants:  None  Procedures:  None  Antimicrobials:  Anti-infectives (From admission, onward)    Start     Dose/Rate Route Frequency Ordered Stop   01/13/23 0600  cefTRIAXone (ROCEPHIN) 2 g in sodium chloride 0.9 % 100 mL IVPB        2 g 200 mL/hr over 30 Minutes Intravenous Every 24 hours 01/12/23 0632     01/13/23 0500  azithromycin (ZITHROMAX) 500 mg in sodium chloride 0.9 % 250 mL IVPB  Status:  Discontinued        500 mg 250 mL/hr over 60 Minutes Intravenous Every 24 hours 01/12/23 0632 01/12/23 0704   01/12/23 1000  azithromycin (ZITHROMAX) tablet 500 mg  500 mg Oral Daily 01/12/23 0704     01/12/23 0445  cefTRIAXone (ROCEPHIN) 2 g in sodium chloride 0.9 % 100 mL IVPB        2 g 200 mL/hr over 30 Minutes Intravenous  Once 01/12/23 0439 01/12/23 0641   01/12/23  0445  azithromycin (ZITHROMAX) 500 mg in sodium chloride 0.9 % 250 mL IVPB  Status:  Discontinued        500 mg 250 mL/hr over 60 Minutes Intravenous Every 24 hours 01/12/23 0439 01/12/23 1610      Subjective: Patient seen and evaluated today with ongoing shortness of breath along with some coughing and congestion that has not improved over the last 24 hours.  She continues to have persistent symptoms.  Objective: Vitals:   01/17/23 0120 01/17/23 0506 01/17/23 0738 01/17/23 0742  BP:  (!) 163/81    Pulse:  88    Resp:  (!) 22    Temp:  98.2 F (36.8 C)    TempSrc:  Oral    SpO2: 97% 97% 97% 97%  Weight:      Height:        Intake/Output Summary (Last 24 hours) at 01/17/2023 1016 Last data filed at 01/17/2023 0900 Gross per 24 hour  Intake 2060 ml  Output --  Net 2060 ml   Filed Weights   01/12/23 0217  Weight: 111.1 kg    Examination:  General exam: Appears calm and comfortable  Respiratory system: Clear to auscultation. Respiratory effort normal.  2 L nasal cannula Cardiovascular system: S1 & S2 heard, RRR.  Gastrointestinal system: Abdomen is soft Central nervous system: Alert and awake Extremities: No edema Skin: No significant lesions noted Psychiatry: Flat affect.    Data Reviewed: I have personally reviewed following labs and imaging studies  CBC: Recent Labs  Lab 01/12/23 0300 01/13/23 0430 01/14/23 0424 01/15/23 0429 01/16/23 0422  WBC 7.7 13.1* 7.8 7.0 7.4  HGB 11.5* 11.8* 11.7* 11.1* 11.1*  HCT 35.7* 37.2 37.7 36.4 34.7*  MCV 88.6 90.3 92.2 90.1 89.4  PLT 204 237 229 230 235   Basic Metabolic Panel: Recent Labs  Lab 01/12/23 0300 01/13/23 0430 01/14/23 0424 01/15/23 0429 01/16/23 0422  NA 136 139 140 138 139  K 3.7 4.0 4.4 4.2 4.1  CL 100 105 101 99 99  CO2 26 25 29 30 30   GLUCOSE 140* 122* 98 96 91  BUN 16 21 33* 29* 27*  CREATININE 0.85 0.80 0.86 0.76 0.83  CALCIUM 9.1 9.1 9.0 9.3 9.2  MG  --   --   --   --  2.5*    GFR: Estimated Creatinine Clearance: 71.3 mL/min (by C-G formula based on SCr of 0.83 mg/dL). Liver Function Tests: Recent Labs  Lab 01/12/23 0300  AST 21  ALT 28  ALKPHOS 113  BILITOT 0.5  PROT 7.0  ALBUMIN 3.7   No results for input(s): "LIPASE", "AMYLASE" in the last 168 hours. No results for input(s): "AMMONIA" in the last 168 hours. Coagulation Profile: No results for input(s): "INR", "PROTIME" in the last 168 hours. Cardiac Enzymes: No results for input(s): "CKTOTAL", "CKMB", "CKMBINDEX", "TROPONINI" in the last 168 hours. BNP (last 3 results) No results for input(s): "PROBNP" in the last 8760 hours. HbA1C: No results for input(s): "HGBA1C" in the last 72 hours. CBG: Recent Labs  Lab 01/16/23 0753 01/16/23 1137 01/16/23 1715 01/16/23 2057 01/17/23 0754  GLUCAP 87 92 140* 154* 97   Lipid Profile: No results for input(s): "CHOL", "  HDL", "LDLCALC", "TRIG", "CHOLHDL", "LDLDIRECT" in the last 72 hours. Thyroid Function Tests: No results for input(s): "TSH", "T4TOTAL", "FREET4", "T3FREE", "THYROIDAB" in the last 72 hours. Anemia Panel: No results for input(s): "VITAMINB12", "FOLATE", "FERRITIN", "TIBC", "IRON", "RETICCTPCT" in the last 72 hours. Sepsis Labs: Recent Labs  Lab 01/12/23 0419  PROCALCITON <0.10    Recent Results (from the past 240 hours)  Respiratory (~20 pathogens) panel by PCR     Status: Abnormal   Collection Time: 01/13/23  1:32 PM   Specimen: Nasopharyngeal Swab; Respiratory  Result Value Ref Range Status   Adenovirus NOT DETECTED NOT DETECTED Final   Coronavirus 229E NOT DETECTED NOT DETECTED Final    Comment: (NOTE) The Coronavirus on the Respiratory Panel, DOES NOT test for the novel  Coronavirus (2019 nCoV)    Coronavirus HKU1 NOT DETECTED NOT DETECTED Final   Coronavirus NL63 NOT DETECTED NOT DETECTED Final   Coronavirus OC43 NOT DETECTED NOT DETECTED Final   Metapneumovirus NOT DETECTED NOT DETECTED Final   Rhinovirus /  Enterovirus NOT DETECTED NOT DETECTED Final   Influenza A NOT DETECTED NOT DETECTED Final   Influenza B NOT DETECTED NOT DETECTED Final   Parainfluenza Virus 1 NOT DETECTED NOT DETECTED Final   Parainfluenza Virus 2 NOT DETECTED NOT DETECTED Final   Parainfluenza Virus 3 NOT DETECTED NOT DETECTED Final   Parainfluenza Virus 4 DETECTED (A) NOT DETECTED Final   Respiratory Syncytial Virus NOT DETECTED NOT DETECTED Final   Bordetella pertussis NOT DETECTED NOT DETECTED Final   Bordetella Parapertussis NOT DETECTED NOT DETECTED Final   Chlamydophila pneumoniae NOT DETECTED NOT DETECTED Final   Mycoplasma pneumoniae NOT DETECTED NOT DETECTED Final    Comment: Performed at [facility], [address] [place], [place] [address]  SARS Coronavirus 2 by RT PCR (hospital order, performed in [facility] hospital lab) *cepheid single result test* Anterior Nasal Swab     Status: None   Collection Time: 01/14/23  9:50 AM   Specimen: Anterior Nasal Swab  Result Value Ref Range Status   SARS Coronavirus 2 by RT PCR NEGATIVE NEGATIVE Final    Comment: (NOTE) SARS-CoV-2 target nucleic acids are NOT DETECTED.  The SARS-CoV-2 RNA is generally detectable in upper and lower respiratory specimens during the acute phase of infection. The lowest concentration of SARS-CoV-2 viral copies this assay can detect is 250 copies / mL. A negative result does not preclude SARS-CoV-2 infection and should not be used as the sole basis for treatment or other patient management decisions.  A negative result may occur with improper specimen collection / handling, submission of specimen other than nasopharyngeal swab, presence of viral mutation(s) within the areas targeted by this assay, and inadequate number of viral copies (<250 copies / mL). A negative result must be combined with clinical observations, patient history, and epidemiological information.  Fact Sheet for Patients:    RoadLapTop.co.za  Fact Sheet for Healthcare Providers: http://kim-miller.com/  This test is not yet approved or  cleared by the [place] FDA and has been authorized for detection and/or diagnosis of SARS-CoV-2 by FDA under an Emergency Use Authorization (EUA).  This EUA will remain in effect (meaning this test can be used) for the duration of the COVID-19 declaration under Section 564(b)(1) of the Act, 21 U.S.C. section 360bbb-3(b)(1), unless the authorization is terminated or revoked sooner.  Performed at [facility], [address], [place], [place] [address]          Radiology Studies: No results  found.      Scheduled Meds:  acetylcysteine  4 mL Nebulization TID   amLODipine  5 mg Oral Daily   aspirin EC  81 mg Oral Daily   azithromycin  500 mg Oral Daily   budesonide (PULMICORT) nebulizer solution  0.5 mg Nebulization BID   cholecalciferol  1,000 Units Oral Daily   [START ON 02/06/2023] cyanocobalamin  1,000 mcg Intramuscular Q30 days   famotidine  20 mg Oral Daily   guaiFENesin-dextromethorphan  10 mL Oral Q8H   insulin aspart  0-5 Units Subcutaneous QHS   insulin aspart  0-9 Units Subcutaneous TID WC   letrozole  2.5 mg Oral Daily   levalbuterol  1.25 mg Nebulization Q6H   losartan  100 mg Oral Daily   methylPREDNISolone sodium succinate  60 mg Intravenous Q12H   rosuvastatin  5 mg Oral Daily   venlafaxine XR  75 mg Oral QHS   Continuous Infusions:  cefTRIAXone (ROCEPHIN)  IV 2 g (01/17/23 0550)     LOS: 5 days    Time spent: 35 minutes    Nicolas Sisler Hoover Brunette, DO [facility]  If 7PM-7AM, please contact night-coverage [email or web address] 01/17/2023, 10:16 AM

## 2023-01-17 NOTE — NC FL2 (Signed)
MEDICAID FL2 LEVEL OF CARE FORM     IDENTIFICATION  Patient Name: Victoria Dean Birthdate: [birth year] Sex: female Admission Date (Current Location): 01/12/2023  [facility] and [place] Number:  [employer] and Address:  [facility],  [address] [place] [address]      Provider Number: [id number]  Attending Physician Name and Address:  Erick Blinks, DO  Relative Name and Phone Number:       Current Level of Care: Hospital Recommended Level of Care: Skilled Nursing Facility Prior Approval Number:    Date Approved/Denied:   PASRR Number:    Discharge Plan: SNF    Current Diagnoses: Patient Active Problem List   Diagnosis Date Noted   CAP (community acquired pneumonia) 01/12/2023   TIA (transient ischemic attack) 06/29/2022   Hypokalemia 06/29/2022   GERD (gastroesophageal reflux disease) 06/29/2022   Hyperlipidemia 06/29/2022   Lumbar radiculopathy 08/14/2021   Acute on chronic respiratory failure with hypoxia (HCC) 01/19/2021   Upper respiratory infection with cough and congestion 01/17/2021   B12 deficiency 10/09/2020   Genetic testing 06/26/2020   Family history of breast cancer    Family history of stomach cancer    Malignant neoplasm of upper-outer quadrant of left breast in female, estrogen receptor positive (HCC) 06/10/2020   Goals of care, counseling/discussion 06/10/2020   Impingement syndrome of right shoulder region 05/06/2019   Low back pain 01/24/2019   Personal history of tobacco use, presenting hazards to health 02/23/2018   History of lumbar laminectomy 04/30/2016   Actinic keratosis 11/21/2014   Adaptation reaction 11/21/2014   Allergic rhinitis 11/21/2014   Cornu cutaneum 11/21/2014   Clinical depression 11/21/2014   Dermatitis, eczematoid 11/21/2014   Enthesopathy 11/21/2014   Essential (primary) hypertension 11/21/2014   Anxiety, generalized 11/21/2014   Herniated nucleus pulposus 11/21/2014   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/21/2014   Adiposity 11/21/2014   Arthritis, degenerative 11/21/2014   Basal cell papilloma 11/21/2014   Avitaminosis D 11/21/2014    Orientation RESPIRATION BLADDER Height & Weight     Self, Time, Situation, Place  O2 Continent Weight: 245 lb (111.1 kg) Height:  5\' 3"  (160 cm)  BEHAVIORAL SYMPTOMS/MOOD NEUROLOGICAL BOWEL NUTRITION STATUS      Continent Diet (Heart healthy)  AMBULATORY STATUS COMMUNICATION OF NEEDS Skin   Extensive Assist Verbally Normal                       Personal Care Assistance Level of Assistance  Bathing, Feeding, Dressing Bathing Assistance: Limited assistance Feeding assistance: Independent Dressing Assistance: Limited assistance     Functional Limitations Info  Sight, Hearing, Speech Sight Info: Impaired Hearing Info: Adequate Speech Info: Adequate    SPECIAL CARE FACTORS FREQUENCY  PT (By licensed PT), OT (By licensed OT)     PT Frequency: 5 times weekly OT Frequency: 5 times weekly            Contractures Contractures Info: Not present    Additional Factors Info  Code Status, Allergies Code Status Info: FULL Allergies Info: Other, Statins, Stiolto Respimat (Tiotropium Bromide-olodaterol), Zetia (Ezetimibe), Sulfa Antibiotics           Current Medications (01/17/2023):  This is the current hospital active medication list Current Facility-Administered Medications  Medication Dose Route Frequency Provider Last Rate Last Admin   acetaminophen (TYLENOL) tablet 650 mg  650 mg Oral Q6H PRN Hall, Carole N, DO   650 mg at  01/16/23 1004   acetylcysteine (MUCOMYST) 20 % nebulizer / oral solution 4 mL  4 mL Nebulization TID Sherryll Burger, Pratik D, DO       ALPRAZolam Prudy Feeler) tablet 0.5 mg  0.5 mg Oral TID PRN Nevin Bloodgood A, MD   0.5 mg at 01/15/23 1430   amLODipine (NORVASC) tablet 5 mg  5 mg Oral Daily Sherryll Burger, Pratik D, DO   5 mg at 01/17/23 1020   aspirin EC tablet 81 mg  81 mg Oral  Daily Dow Adolph N, DO   81 mg at 01/17/23 1016   azithromycin (ZITHROMAX) tablet 500 mg  500 mg Oral Daily Shahmehdi, Seyed A, MD   500 mg at 01/17/23 1020   budesonide (PULMICORT) nebulizer solution 0.5 mg  0.5 mg Nebulization BID Shahmehdi, Seyed A, MD   0.5 mg at 01/17/23 0742   cefTRIAXone (ROCEPHIN) 2 g in sodium chloride 0.9 % 100 mL IVPB  2 g Intravenous Q24H Dow Adolph N, DO 200 mL/hr at 01/17/23 0550 2 g at 01/17/23 0550   cholecalciferol (VITAMIN D3) 25 MCG (1000 UNIT) tablet 1,000 Units  1,000 Units Oral Daily Dow Adolph N, DO   1,000 Units at 01/17/23 1016   [START ON 02/06/2023] cyanocobalamin (VITAMIN B12) injection 1,000 mcg  1,000 mcg Intramuscular Q30 days Hall, Carole N, DO       famotidine (PEPCID) tablet 20 mg  20 mg Oral Daily Hall, Carole N, DO   20 mg at 01/17/23 1020   guaiFENesin-dextromethorphan (ROBITUSSIN DM) 100-10 MG/5ML syrup 10 mL  10 mL Oral Q8H Shahmehdi, Seyed A, MD   10 mL at 01/17/23 1224   insulin aspart (novoLOG) injection 0-5 Units  0-5 Units Subcutaneous QHS Hall, Carole N, DO       insulin aspart (novoLOG) injection 0-9 Units  0-9 Units Subcutaneous TID WC Dow Adolph N, DO   1 Units at 01/17/23 1225   ipratropium (ATROVENT) nebulizer solution 0.5 mg  0.5 mg Nebulization Q4H PRN Sherryll Burger, Pratik D, DO   0.5 mg at 01/17/23 1308   letrozole [facility]) tablet 2.5 mg  2.5 mg Oral Daily Dow Adolph N, DO   2.5 mg at 01/17/23 1016   levalbuterol (XOPENEX) nebulizer solution 1.25 mg  1.25 mg Nebulization Q6H Shahmehdi, Seyed A, MD   1.25 mg at 01/17/23 0738   losartan (COZAAR) tablet 100 mg  100 mg Oral Daily Sherryll Burger, Pratik D, DO   100 mg at 01/17/23 1017   melatonin tablet 6 mg  6 mg Oral QHS PRN Nevin Bloodgood A, MD   6 mg at 01/16/23 2250   methylPREDNISolone sodium succinate (SOLU-MEDROL) 125 mg/2 mL injection 60 mg  60 mg Intravenous Q12H Shah, Pratik D, DO   60 mg at 01/17/23 1018   polyethylene glycol (MIRALAX / GLYCOLAX) packet 17 g  17 g Oral Daily PRN  Dow Adolph N, DO       prochlorperazine (COMPAZINE) injection 5 mg  5 mg Intravenous Q6H PRN Hall, Carole N, DO       rosuvastatin (CRESTOR) tablet 5 mg  5 mg Oral Daily Bolton, Carole N, DO   5 mg at 01/17/23 1016   venlafaxine XR (EFFEXOR-XR) 24 hr capsule 75 mg  75 mg Oral QHS Hall, Carole N, DO   75 mg at 01/16/23 2250     Discharge Medications: Please see discharge summary for a list of discharge medications.  Relevant Imaging Results:  Relevant Lab Results:   Additional Information SSN: [id number]  Villa Herb, LCSWA

## 2023-01-17 NOTE — TOC Progression Note (Signed)
Transition of Care [facility]) - Progression Note    Patient Details  Name: Victoria Dean MRN: [id number] Date of Birth: 16-Aug-1950  Transition of Care [facility]) CM/SW Contact  Villa Herb, [place] Phone Number: 01/17/2023, 1:19 PM  Clinical Narrative:    CSW spoke with pt at bedside. Pt states she would like SNF referral sent out as she may want to go to SNF instead of family's home with [facility]. Pt would like referral sent out locally for review. TOC to follow.   Expected Discharge Plan: Home w Home Health Services Barriers to Discharge: Continued Medical Work up  Expected Discharge Plan and Services In-house Referral: Clinical Social Work Discharge Planning Services: CM Consult Post Acute Care Choice: Home Health Living arrangements for the past 2 months: Single Family Home                           HH Arranged: PT, OT HH Agency: [facility] Date [facility] Agency Contacted: 01/15/23   Representative spoke with at [facility] Agency: Kandee Keen   Social Determinants of Health (SDOH) Interventions SDOH Screenings   Food Insecurity: No Food Insecurity (01/12/2023)  Housing: Low Risk  (01/12/2023)  Transportation Needs: No Transportation Needs (01/12/2023)  Utilities: Not At Risk (01/12/2023)  Alcohol Screen: Low Risk  (10/23/2021)  Depression (PHQ2-9): Low Risk  (10/23/2021)  Financial Resource Strain: Low Risk  (10/23/2021)  Physical Activity: Inactive (02/16/2018)  Social Connections: Moderately Isolated (10/23/2021)  Stress: No Stress Concern Present (02/16/2018)  Tobacco Use: Medium Risk (01/12/2023)    Readmission Risk Interventions    01/12/2023   12:48 PM  Readmission Risk Prevention Plan  Medication Screening Complete  Transportation Screening Complete

## 2023-01-17 NOTE — Progress Notes (Signed)
Mobility Specialist Progress Note:    01/17/23 1044  Mobility  Activity Ambulated with assistance in hallway  Level of Assistance Standby assist, set-up cues, supervision of patient - no hands on  Assistive Device Cane  Distance Ambulated (ft) 65 ft  Range of Motion/Exercises Active;All extremities  Activity Response Tolerated well  Mobility Referral Yes  Mobility visit 1 Mobility  Mobility Specialist Start Time (ACUTE ONLY) [id number]  Mobility Specialist Stop Time (ACUTE ONLY) 0925  Mobility Specialist Time Calculation (min) (ACUTE ONLY) 30 min   Pt received sitting EOB, agreeable to mobility. Required supervision to stand and ambulate with cane. Tolerated well, SpO2 94% on 2L at rest. SpO2 89% on 2L during ambulation. SPO2 93% on 2L after session. Left pt sitting EOB, all needs met.   Lawerance Bach Mobility Specialist Please contact via Special educational needs teacher or  Rehab office at [phone]

## 2023-01-17 NOTE — Plan of Care (Signed)
  Problem: Activity: Goal: Risk for activity intolerance will decrease Outcome: Progressing   Problem: Nutrition: Goal: Adequate nutrition will be maintained Outcome: Progressing   Problem: Coping: Goal: Ability to adjust to condition or change in health will improve Outcome: Progressing

## 2023-01-18 DIAGNOSIS — J189 Pneumonia, unspecified organism: Secondary | ICD-10-CM | POA: Diagnosis not present

## 2023-01-18 LAB — GLUCOSE, CAPILLARY
Glucose-Capillary: 137 mg/dL — ABNORMAL HIGH (ref 70–99)
Glucose-Capillary: 135 mg/dL — ABNORMAL HIGH (ref 70–99)
Glucose-Capillary: 163 mg/dL — ABNORMAL HIGH (ref 70–99)
Glucose-Capillary: 122 mg/dL — ABNORMAL HIGH (ref 70–99)

## 2023-01-18 MED ORDER — METHYLPREDNISOLONE SODIUM SUCC 125 MG IJ SOLR
40.0000 mg | Freq: Two times a day (BID) | INTRAMUSCULAR | Status: DC
  Administered 2023-01-18 – 2023-01-21 (×6): 40 mg via INTRAVENOUS
  Filled 2023-01-18 (×6): qty 2

## 2023-01-18 NOTE — Progress Notes (Signed)
PROGRESS NOTE    Victoria Dean  ZOX:[id number] DOB: 08-19-1950 DOA: 01/12/2023 PCP: Oneal Grout, FNP   Brief Narrative:    Victoria Dean is a 72 y.o. female with medical history significant for breast cancer on letrozole, chronic anxiety/depression, hypertension, hyperlipidemia, GERD, severe morbid obesity, who initially presented to [facility] ED where she left AMA due to lengthy wait time.  Has been having progressively worsening shortness of breath, nonproductive cough, and palpitations for the last several days.  No reported subjective fevers or chills.  She was admitted with acute hypoxemic respiratory failure secondary to parainfluenza infection along with multifocal community-acquired pneumonia.  Working on oxygen weaning, but she now appears dependent and continues to remain symptomatic.  She appears to have reached near maximal medical benefit and is awaiting SNF placement.  Assessment & Plan:   Principal Problem:   CAP (community acquired pneumonia)  Assessment and Plan:   Acute hypoxic respiratory failure secondary pneumonia/parainfluenza   -Still having significant shortness of breath, especially with exertion, improved diffuse wheezing and rhonchi Still having pursed lip breathing -Was on 4 L of oxygen, wean down to 2 L this morning, satting 95% Respiratory panel Virus panel positive for parainfluenza virus 4 - Not on oxygen supplementation at baseline -at home -Scheduling DuoNeb bronchodilators, adding Pulmicort nebs -Continue IV steroids -with slow taper upon discharge -Continue treating underlying pneumonia -Continue mucolytics, spirometer, and now flutter valve and mucomyst   Multifocal pneumonia, POA-completed treatment -Likely superimposed bilateral infection -With acute respiratory failure, still was requiring 4 L of oxygen this morning down to 2 L, satting 95%  -Continue to have shortness of breath, diffuse wheezes and rhonchi   -CTA-negative for pulmonary  embolism   - As needed bronchodilators - Antitussives as needed -Discontinue further IV antibiotics   Elevated troponin, suspect demand ischemia in the setting of acute hypoxic respiratory failure -Denies any chest pain - High-sensitivity troponin peaked at 27 and downtrending. - Last 2D echo done on 06/29/2022 revealed LVEF 60 to 65% with no regional wall motion abnormalities. - Monitor on telemetry   Chronic anxiety/depression Resume home regimen. -Added as needed Xanax   Hyperlipidemia Continue statins   GERD Continue PPI   Generalized weakness PT OT assessment recommending SNF and patient currently appears interested, placement pending   Hyperglycemia Last hemoglobin A1c 6.1 on 10/22/2021 Repeat hemoglobin A1c 5.8% Start insulin sliding scale   Sinus tachycardia, suspect secondary to hypovolemia Monitoring, status post IV fluid resuscitation -Worsening tachycardia with breathing treatments, anxiety  Morbid obesity class III BMI 43.4    DVT prophylaxis: SCDs Code Status: Full Family Communication: None at bedside Disposition Plan:  Status is: Inpatient Remains inpatient appropriate because: Need for IV medications  Consultants:  None  Procedures:  None  Antimicrobials:  Anti-infectives (From admission, onward)    Start     Dose/Rate Route Frequency Ordered Stop   01/13/23 0600  cefTRIAXone (ROCEPHIN) 2 g in sodium chloride 0.9 % 100 mL IVPB  Status:  Discontinued        2 g 200 mL/hr over 30 Minutes Intravenous Every 24 hours 01/12/23 0632 01/18/23 1006   01/13/23 0500  azithromycin (ZITHROMAX) 500 mg in sodium chloride 0.9 % 250 mL IVPB  Status:  Discontinued        500 mg 250 mL/hr over 60 Minutes Intravenous Every 24 hours 01/12/23 0632 01/12/23 0704   01/12/23 1000  azithromycin (ZITHROMAX) tablet 500 mg  Status:  Discontinued  500 mg Oral Daily 01/12/23 0704 01/18/23 1006   01/12/23 0445  cefTRIAXone (ROCEPHIN) 2 g in sodium chloride 0.9 %  100 mL IVPB        2 g 200 mL/hr over 30 Minutes Intravenous  Once 01/12/23 0439 01/12/23 0641   01/12/23 0445  azithromycin (ZITHROMAX) 500 mg in sodium chloride 0.9 % 250 mL IVPB  Status:  Discontinued        500 mg 250 mL/hr over 60 Minutes Intravenous Every 24 hours 01/12/23 0439 01/12/23 7425      Subjective: Patient seen and evaluated today with ongoing shortness of breath that appears to be plateauing in response to medications.  She states that her cough and congestion have improved, however.  Objective: Vitals:   01/18/23 0505 01/18/23 0725 01/18/23 0731 01/18/23 0733  BP: (!) 159/83     Pulse: 90     Resp: 20     Temp: 97.8 F (36.6 C)     TempSrc:      SpO2: 97% 97% 97% 97%  Weight:      Height:        Intake/Output Summary (Last 24 hours) at 01/18/2023 1006 Last data filed at 01/18/2023 0830 Gross per 24 hour  Intake 1260 ml  Output --  Net 1260 ml   Filed Weights   01/12/23 0217  Weight: 111.1 kg    Examination:  General exam: Appears calm and comfortable  Respiratory system: Clear to auscultation. Respiratory effort normal.  2 L nasal cannula Cardiovascular system: S1 & S2 heard, RRR.  Gastrointestinal system: Abdomen is soft Central nervous system: Alert and awake Extremities: No edema Skin: No significant lesions noted Psychiatry: Flat affect.    Data Reviewed: I have personally reviewed following labs and imaging studies  CBC: Recent Labs  Lab 01/12/23 0300 01/13/23 0430 01/14/23 0424 01/15/23 0429 01/16/23 0422  WBC 7.7 13.1* 7.8 7.0 7.4  HGB 11.5* 11.8* 11.7* 11.1* 11.1*  HCT 35.7* 37.2 37.7 36.4 34.7*  MCV 88.6 90.3 92.2 90.1 89.4  PLT 204 237 229 230 235   Basic Metabolic Panel: Recent Labs  Lab 01/12/23 0300 01/13/23 0430 01/14/23 0424 01/15/23 0429 01/16/23 0422  NA 136 139 140 138 139  K 3.7 4.0 4.4 4.2 4.1  CL 100 105 101 99 99  CO2 26 25 29 30 30   GLUCOSE 140* 122* 98 96 91  BUN 16 21 33* 29* 27*  CREATININE  0.85 0.80 0.86 0.76 0.83  CALCIUM 9.1 9.1 9.0 9.3 9.2  MG  --   --   --   --  2.5*   GFR: Estimated Creatinine Clearance: 71.3 mL/min (by C-G formula based on SCr of 0.83 mg/dL). Liver Function Tests: Recent Labs  Lab 01/12/23 0300  AST 21  ALT 28  ALKPHOS 113  BILITOT 0.5  PROT 7.0  ALBUMIN 3.7   No results for input(s): "LIPASE", "AMYLASE" in the last 168 hours. No results for input(s): "AMMONIA" in the last 168 hours. Coagulation Profile: No results for input(s): "INR", "PROTIME" in the last 168 hours. Cardiac Enzymes: No results for input(s): "CKTOTAL", "CKMB", "CKMBINDEX", "TROPONINI" in the last 168 hours. BNP (last 3 results) No results for input(s): "PROBNP" in the last 8760 hours. HbA1C: No results for input(s): "HGBA1C" in the last 72 hours. CBG: Recent Labs  Lab 01/17/23 0754 01/17/23 1145 01/17/23 1648 01/17/23 2024 01/18/23 0731  GLUCAP 97 144* 113* 124* 137*   Lipid Profile: No results for input(s): "CHOL", "HDL", "LDLCALC", "TRIG", "  CHOLHDL", "LDLDIRECT" in the last 72 hours. Thyroid Function Tests: No results for input(s): "TSH", "T4TOTAL", "FREET4", "T3FREE", "THYROIDAB" in the last 72 hours. Anemia Panel: No results for input(s): "VITAMINB12", "FOLATE", "FERRITIN", "TIBC", "IRON", "RETICCTPCT" in the last 72 hours. Sepsis Labs: Recent Labs  Lab 01/12/23 0419  PROCALCITON <0.10    Recent Results (from the past 240 hours)  Respiratory (~20 pathogens) panel by PCR     Status: Abnormal   Collection Time: 01/13/23  1:32 PM   Specimen: Nasopharyngeal Swab; Respiratory  Result Value Ref Range Status   Adenovirus NOT DETECTED NOT DETECTED Final   Coronavirus 229E NOT DETECTED NOT DETECTED Final    Comment: (NOTE) The Coronavirus on the Respiratory Panel, DOES NOT test for the novel  Coronavirus (2019 nCoV)    Coronavirus HKU1 NOT DETECTED NOT DETECTED Final   Coronavirus NL63 NOT DETECTED NOT DETECTED Final   Coronavirus OC43 NOT DETECTED NOT  DETECTED Final   Metapneumovirus NOT DETECTED NOT DETECTED Final   Rhinovirus / Enterovirus NOT DETECTED NOT DETECTED Final   Influenza A NOT DETECTED NOT DETECTED Final   Influenza B NOT DETECTED NOT DETECTED Final   Parainfluenza Virus 1 NOT DETECTED NOT DETECTED Final   Parainfluenza Virus 2 NOT DETECTED NOT DETECTED Final   Parainfluenza Virus 3 NOT DETECTED NOT DETECTED Final   Parainfluenza Virus 4 DETECTED (A) NOT DETECTED Final   Respiratory Syncytial Virus NOT DETECTED NOT DETECTED Final   Bordetella pertussis NOT DETECTED NOT DETECTED Final   Bordetella Parapertussis NOT DETECTED NOT DETECTED Final   Chlamydophila pneumoniae NOT DETECTED NOT DETECTED Final   Mycoplasma pneumoniae NOT DETECTED NOT DETECTED Final    Comment: Performed at [facility], [address] [place], [place] [address]  SARS Coronavirus 2 by RT PCR (hospital order, performed in [facility] hospital lab) *cepheid single result test* Anterior Nasal Swab     Status: None   Collection Time: 01/14/23  9:50 AM   Specimen: Anterior Nasal Swab  Result Value Ref Range Status   SARS Coronavirus 2 by RT PCR NEGATIVE NEGATIVE Final    Comment: (NOTE) SARS-CoV-2 target nucleic acids are NOT DETECTED.  The SARS-CoV-2 RNA is generally detectable in upper and lower respiratory specimens during the acute phase of infection. The lowest concentration of SARS-CoV-2 viral copies this assay can detect is 250 copies / mL. A negative result does not preclude SARS-CoV-2 infection and should not be used as the sole basis for treatment or other patient management decisions.  A negative result may occur with improper specimen collection / handling, submission of specimen other than nasopharyngeal swab, presence of viral mutation(s) within the areas targeted by this assay, and inadequate number of viral copies (<250 copies / mL). A negative result must be combined with clinical observations, patient history, and  epidemiological information.  Fact Sheet for Patients:   RoadLapTop.co.za  Fact Sheet for Healthcare Providers: http://kim-miller.com/  This test is not yet approved or  cleared by the [place] FDA and has been authorized for detection and/or diagnosis of SARS-CoV-2 by FDA under an Emergency Use Authorization (EUA).  This EUA will remain in effect (meaning this test can be used) for the duration of the COVID-19 declaration under Section 564(b)(1) of the Act, 21 U.S.C. section 360bbb-3(b)(1), unless the authorization is terminated or revoked sooner.  Performed at [facility], [address], [place], [place] [address]          Radiology Studies: No results found.  Scheduled Meds:  acetylcysteine  4 mL Nebulization TID   amLODipine  5 mg Oral Daily   aspirin EC  81 mg Oral Daily   budesonide (PULMICORT) nebulizer solution  0.5 mg Nebulization BID   cholecalciferol  1,000 Units Oral Daily   [START ON 02/06/2023] cyanocobalamin  1,000 mcg Intramuscular Q30 days   famotidine  20 mg Oral Daily   guaiFENesin-dextromethorphan  10 mL Oral Q8H   insulin aspart  0-5 Units Subcutaneous QHS   insulin aspart  0-9 Units Subcutaneous TID WC   letrozole  2.5 mg Oral Daily   levalbuterol  1.25 mg Nebulization Q6H   losartan  100 mg Oral Daily   methylPREDNISolone sodium succinate  40 mg Intravenous Q12H   rosuvastatin  5 mg Oral Daily   venlafaxine XR  75 mg Oral QHS     LOS: 6 days    Time spent: 35 minutes    Irwin Toran Hoover Brunette, DO [facility]  If 7PM-7AM, please contact night-coverage [email or web address] 01/18/2023, 10:06 AM

## 2023-01-18 NOTE — Progress Notes (Signed)
Ambulated twice in hallway about 100 feet each time and has been ambulating in room back and forth from door to bed often.  On room air today at rest was 91, ambulating on room air was 84 and winded. Needed 2L ambulating for 91%

## 2023-01-18 NOTE — Progress Notes (Signed)
Patient R hand IV infiltrated, knot noted. Patient c/o burning pain. IV removed.

## 2023-01-18 NOTE — Plan of Care (Signed)
  Problem: Education: Goal: Knowledge of General Education information will improve Description: Including pain rating scale, medication(s)/side effects and non-pharmacologic comfort measures Outcome: Progressing   Problem: Clinical Measurements: Goal: Will remain free from infection Outcome: Progressing Goal: Respiratory complications will improve Outcome: Progressing   Problem: Pain Management: Goal: General experience of comfort will improve Outcome: Progressing

## 2023-01-18 NOTE — TOC Progression Note (Signed)
Transition of Care [facility]) - Progression Note    Patient Details  Name: Victoria Dean MRN: [id number] Date of Birth: 11-18-1950  [facility] CM/SW Contact  [place] Herb, [place] Phone Number: 01/18/2023, 10:38 AM  Clinical Narrative:    SNF referral sent out, bed offers pending at this time. Insurance auth unable to be started today. TOC will follow up with pt in AM on D/C plan and start auth. TOC to follow.   Expected Discharge Plan: Home w Home Health Services Barriers to Discharge: Continued Medical Work up  Expected Discharge Plan and Services In-house Referral: Clinical Social Work Discharge Planning Services: CM Consult Post Acute Care Choice: Home Health Living arrangements for the past 2 months: Single Family Home                           HH Arranged: PT, OT HH Agency: [facility] Date [facility] Agency Contacted: 01/15/23   Representative spoke with at [facility] Agency: Kandee Keen   Social Determinants of Health (SDOH) Interventions SDOH Screenings   Food Insecurity: No Food Insecurity (01/12/2023)  Housing: Low Risk  (01/12/2023)  Transportation Needs: No Transportation Needs (01/12/2023)  Utilities: Not At Risk (01/12/2023)  Alcohol Screen: Low Risk  (10/23/2021)  Depression (PHQ2-9): Low Risk  (10/23/2021)  Financial Resource Strain: Low Risk  (10/23/2021)  Physical Activity: Inactive (02/16/2018)  Social Connections: Moderately Isolated (10/23/2021)  Stress: No Stress Concern Present (02/16/2018)  Tobacco Use: Medium Risk (01/12/2023)    Readmission Risk Interventions    01/12/2023   12:48 PM  Readmission Risk Prevention Plan  Medication Screening Complete  Transportation Screening Complete

## 2023-01-19 ENCOUNTER — Telehealth: Payer: Self-pay | Admitting: Oncology

## 2023-01-19 DIAGNOSIS — J9601 Acute respiratory failure with hypoxia: Secondary | ICD-10-CM | POA: Diagnosis not present

## 2023-01-19 DIAGNOSIS — J189 Pneumonia, unspecified organism: Secondary | ICD-10-CM | POA: Diagnosis not present

## 2023-01-19 LAB — MAGNESIUM: Magnesium: 2.6 mg/dL — ABNORMAL HIGH (ref 1.7–2.4)

## 2023-01-19 LAB — CBC
HCT: 38.3 % (ref 36.0–46.0)
Hemoglobin: 11.8 g/dL — ABNORMAL LOW (ref 12.0–15.0)
MCH: 27.3 pg (ref 26.0–34.0)
MCHC: 30.8 g/dL (ref 30.0–36.0)
MCV: 88.7 fL (ref 80.0–100.0)
Platelets: 297 10*3/uL (ref 150–400)
RBC: 4.32 MIL/uL (ref 3.87–5.11)
RDW: 15.3 % (ref 11.5–15.5)
WBC: 12.5 10*3/uL — ABNORMAL HIGH (ref 4.0–10.5)
nRBC: 0 % (ref 0.0–0.2)

## 2023-01-19 LAB — BASIC METABOLIC PANEL
Anion gap: 9 (ref 5–15)
BUN: 29 mg/dL — ABNORMAL HIGH (ref 8–23)
CO2: 28 mmol/L (ref 22–32)
Calcium: 9 mg/dL (ref 8.9–10.3)
Chloride: 100 mmol/L (ref 98–111)
Creatinine, Ser: 0.99 mg/dL (ref 0.44–1.00)
GFR, Estimated: 60 mL/min — ABNORMAL LOW (ref >60)
Glucose, Bld: 140 mg/dL — ABNORMAL HIGH (ref 70–99)
Potassium: 4.8 mmol/L (ref 3.5–5.1)
Sodium: 137 mmol/L (ref 135–145)

## 2023-01-19 LAB — GLUCOSE, CAPILLARY
Glucose-Capillary: 133 mg/dL — ABNORMAL HIGH (ref 70–99)
Glucose-Capillary: 111 mg/dL — ABNORMAL HIGH (ref 70–99)
Glucose-Capillary: 112 mg/dL — ABNORMAL HIGH (ref 70–99)
Glucose-Capillary: 133 mg/dL — ABNORMAL HIGH (ref 70–99)

## 2023-01-19 MED ORDER — LEVALBUTEROL HCL 1.25 MG/0.5ML IN NEBU
1.2500 mg | INHALATION_SOLUTION | Freq: Three times a day (TID) | RESPIRATORY_TRACT | Status: DC
  Administered 2023-01-19 – 2023-01-21 (×6): 1.25 mg via RESPIRATORY_TRACT
  Filled 2023-01-19 (×7): qty 0.5

## 2023-01-19 NOTE — Plan of Care (Signed)

## 2023-01-19 NOTE — Plan of Care (Signed)
  Problem: Education: Goal: Knowledge of General Education information will improve Description Including pain rating scale, medication(s)/side effects and non-pharmacologic comfort measures Outcome: Progressing   Problem: Education: Goal: Knowledge of General Education information will improve Description Including pain rating scale, medication(s)/side effects and non-pharmacologic comfort measures Outcome: Progressing   

## 2023-01-19 NOTE — Telephone Encounter (Signed)
Push everything out by 4 weeks.  Will see her after I get back

## 2023-01-19 NOTE — Progress Notes (Signed)
Physical Therapy Treatment Patient Details Name: Victoria Dean MRN: [id number] DOB: 1950-03-26 Today's Date: 01/19/2023   History of Present Illness Victoria Dean is a 72 y.o. female with medical history significant for breast cancer on letrozole, chronic anxiety/depression, hypertension, hyperlipidemia, GERD, severe morbid obesity, who initially presented to [facility] ED where she left AMA due to lengthy wait time.  Has been having progressively worsening shortness of breath, nonproductive cough, and palpitations for the last several days.  No reported subjective fevers or chills.     In the ED, tachycardic and tachypneic with new oxygen requirement.  BP 125/68, pulse 108, respiration rate 25, O2 saturation 92% on 4 L.       CT angio chest was negative for pulmonary embolism however showed bronchitis with patchy areas of bronchiolitis/bronchopneumonia in the bilateral lungs.  The patient was started on empiric IV antibiotic Rocephin and azithromycin.  Admitted by [facility], hospitalist service.    PT Comments  Patient sitting on edge of bed on therapist arrival; finishing up lunch. She is agreeable to therapy session.  She is able to perform sit to stand with [facility] and Supervision and walk to the bathroom; toilet and clean up after toileting with therapist continued supervision for safety.  Patient ambulates x 65 ft with SPC and 2 L of O2; slow gait speed noted.  Noted patient is aware of her breathing and takes deep breaths as needed; after walking and a 1 min rest patient O2 saturation is at 94%.  Patient will benefit from continued skilled therapy services during the remainder of her hospital stay and at the next recommended venue of care to address deficits and promote return to optimal function.        If plan is discharge home, recommend the following: A little help with walking and/or transfers;A little help with bathing/dressing/bathroom;Assistance with cooking/housework;Assist for transportation;Help with  stairs or ramp for entrance   Can travel by private vehicle     Yes  Equipment Recommendations  Rolling walker (2 wheels)    Recommendations for Other Services       Precautions / Restrictions Precautions Precautions: Fall Restrictions Weight Bearing Restrictions Per Provider Order: No     Mobility  Bed Mobility               General bed mobility comments: pt sitting on EOB upon therapist entrance Patient Response: Cooperative  Transfers Overall transfer level: Needs assistance Equipment used: Straight cane Transfers: Sit to/from Stand Sit to Stand: Supervision                Ambulation/Gait Ambulation/Gait assistance: Min assist (min A to manage O2 tank) Gait Distance (Feet): 65 Feet Assistive device: Straight cane Gait Pattern/deviations: Decreased step length - right, Decreased step length - left, Step-to pattern, Decreased stride length Gait velocity: slow     General Gait Details: 92% after ambulation   Stairs             Wheelchair Mobility     Tilt Bed Tilt Bed Patient Response: Cooperative  Modified Rankin (Stroke Patients Only)       Balance Overall balance assessment: Needs assistance Sitting-balance support: Feet supported, No upper extremity supported Sitting balance-Leahy Scale: Good Sitting balance - Comments: good sitting balance on EOB   Standing balance support: During functional activity, Single extremity supported, Reliant on assistive device for balance Standing balance-Leahy Scale: Fair Standing balance comment: fair to good standing balance with SPC  Cognition Arousal: Alert Behavior During Therapy: WFL for tasks assessed/performed Overall Cognitive Status: Within Functional Limits for tasks assessed                                          Exercises      General Comments        Pertinent Vitals/Pain Pain Assessment Pain Assessment: No/denies  pain (no new pain; has some chronic low back pain)    Home Living                          Prior Function            PT Goals (current goals can now be found in the care plan section) Acute Rehab PT Goals Patient Stated Goal: return home PT Goal Formulation: With patient Potential to Achieve Goals: Good Progress towards PT goals: Progressing toward goals    Frequency    Min 3X/week      PT Plan      Co-evaluation              AM-PAC PT "6 Clicks" Mobility   Outcome Measure  Help needed turning from your back to your side while in a flat bed without using bedrails?: None Help needed moving from lying on your back to sitting on the side of a flat bed without using bedrails?: A Little Help needed moving to and from a bed to a chair (including a wheelchair)?: A Little Help needed standing up from a chair using your arms (e.g., wheelchair or bedside chair)?: A Little Help needed to walk in hospital room?: A Little Help needed climbing 3-5 steps with a railing? : A Lot 6 Click Score: 18    End of Session Equipment Utilized During Treatment: Oxygen Activity Tolerance: Patient limited by fatigue Patient left: in bed;with call bell/phone within reach Nurse Communication: Mobility status PT Visit Diagnosis: Unsteadiness on feet (R26.81);Other abnormalities of gait and mobility (R26.89);Muscle weakness (generalized) (M62.81)     Time: 4098-1191 PT Time Calculation (min) (ACUTE ONLY): 24 min  Charges:    $Gait Training: 8-22 mins $Therapeutic Activity: 8-22 mins PT General Charges $$ ACUTE PT VISIT: 1 Visit                     12:47 PM, 01/19/23 Delyla Sandeen Small Iliya Spivack MPT [facility] [place] [phone] Ph:[phone]

## 2023-01-19 NOTE — Progress Notes (Signed)
PROGRESS NOTE    Victoria Dean  URK:[id number] DOB: Mar 13, 1950 DOA: 01/12/2023 PCP: Oneal Grout, FNP   Brief Narrative:    Victoria Dean is a 72 y.o. female with medical history significant for breast cancer on letrozole, chronic anxiety/depression, hypertension, hyperlipidemia, GERD, severe morbid obesity, who initially presented to [facility] ED where she left AMA due to lengthy wait time.  Has been having progressively worsening shortness of breath, nonproductive cough, and palpitations for the last several days.  No reported subjective fevers or chills.  She was admitted with acute hypoxemic respiratory failure secondary to parainfluenza infection along with multifocal community-acquired pneumonia.  Working on oxygen weaning, but she now appears dependent and continues to remain symptomatic.  She appears to have reached near maximal medical benefit and is awaiting SNF placement.  Assessment & Plan:   Principal Problem:   CAP (community acquired pneumonia)  Assessment and Plan:   Acute hypoxic respiratory failure secondary pneumonia/parainfluenza   -Still having significant shortness of breath, especially with exertion, improved diffuse wheezing and rhonchi Still having pursed lip breathing -Was on 4 L of oxygen, wean down to 2 L this morning, satting 95% -Respiratory panel: positive for parainfluenza virus 4 - Not on oxygen supplementation at baseline -at home -Scheduling DuoNeb bronchodilators, adding Pulmicort nebs -Continue slow stable steroid tapering  -Patient has completed antibiotic therapy for superimposed bacterial pneumonia as mentioned below. -Continue mucolytics, spirometer, flutter valve and mucomyst   Multifocal pneumonia, POA-completed treatment -Likely superimposed bilateral infection -With acute respiratory failure, still was requiring 4 L of oxygen this morning down to 2 L, satting 95%  -Improve air movement bilaterally; still with appreciated rhonchi on exam.  2 L  nasal cannula supplementation in place with good saturation -No wheezing on exam. -Continue the use of flutter valve/incentive spirometer and as needed bronchodilator -CTA-negative for pulmonary embolism  -Continue as needed antitussive medications; patient no longer requiring antibiotics.   Elevated troponin, suspect demand ischemia in the setting of acute hypoxic respiratory failure -Denies any chest pain - High-sensitivity troponin peaked at 27 and downtrending. - Last 2D echo done on 06/29/2022 revealed LVEF 60 to 65% with no regional wall motion abnormalities. -Will discontinue telemetry.   Chronic anxiety/depression -Continue home anxiolytic/antidepressant regimen. -No suicidal ideation or hallucinations.   Hyperlipidemia -Continue statins -Continue to follow lipid panel and LFTs intermittently.   GERD -Continue PPI -Lifestyle changes discussed with patient.   Generalized weakness -PT OT assessment recommending SNF and patient currently appears interested, placement pending. -Facility has been accepted; awaiting insurance authorization.   Hyperglycemia -Last hemoglobin A1c 6.1 on 10/22/2021 -Updated A1c 5.8% -Continue sliding scale insulin.   Sinus tachycardia, suspect secondary to hypovolemia -Stable and well-controlled -Will discontinue telemetry. -Sinus rhythm and and no presence of arrhythmia.  Morbid obesity class III -BMI 43.4 -Low-calorie diet, portion control and increase physical activity discussed with patient.    DVT prophylaxis: SCDs Code Status: Full Family Communication: None at bedside Disposition Plan:  Status is: Inpatient Remains inpatient appropriate because: Need for IV medications  Consultants:  None  Procedures:  None  Antimicrobials:  Anti-infectives (From admission, onward)    Start     Dose/Rate Route Frequency Ordered Stop   01/13/23 0600  cefTRIAXone (ROCEPHIN) 2 g in sodium chloride 0.9 % 100 mL IVPB  Status:  Discontinued         2 g 200 mL/hr over 30 Minutes Intravenous Every 24 hours 01/12/23 0632 01/18/23 1006   01/13/23 0500  azithromycin (ZITHROMAX)  500 mg in sodium chloride 0.9 % 250 mL IVPB  Status:  Discontinued        500 mg 250 mL/hr over 60 Minutes Intravenous Every 24 hours 01/12/23 0632 01/12/23 0704   01/12/23 1000  azithromycin (ZITHROMAX) tablet 500 mg  Status:  Discontinued        500 mg Oral Daily 01/12/23 0704 01/18/23 1006   01/12/23 0445  cefTRIAXone (ROCEPHIN) 2 g in sodium chloride 0.9 % 100 mL IVPB        2 g 200 mL/hr over 30 Minutes Intravenous  Once 01/12/23 0439 01/12/23 0641   01/12/23 0445  azithromycin (ZITHROMAX) 500 mg in sodium chloride 0.9 % 250 mL IVPB  Status:  Discontinued        500 mg 250 mL/hr over 60 Minutes Intravenous Every 24 hours 01/12/23 0439 01/12/23 4098      Subjective: Patient continued reported improvement in coughing spells and shortness of breath; weak, tired and deconditioned.  Using 2 L nasal cannula supplementation to maintain saturation.  Objective: Vitals:   01/19/23 0730 01/19/23 0807 01/19/23 1303 01/19/23 1423  BP:  (!) 173/86  (!) 142/71  Pulse:  90  92  Resp:  19  20  Temp:  (!) 97.3 F (36.3 C)    TempSrc:  Oral    SpO2: 96% 92% 94% 98%  Weight:      Height:        Intake/Output Summary (Last 24 hours) at 01/19/2023 1802 Last data filed at 01/19/2023 1422 Gross per 24 hour  Intake 480 ml  Output --  Net 480 ml   Filed Weights   01/12/23 0217  Weight: 111.1 kg    Examination: General exam: Alert, awake, oriented x 3; weak and tired; no chest pain, no nausea, no vomiting.  Good saturation on 2 L supplementation. Respiratory system: Positive scattered rhonchi; no wheezing.  No using accessory muscles. Cardiovascular system:RRR. No rubs or gallops. Gastrointestinal system: Abdomen is obese, nondistended, soft and nontender. No organomegaly or masses felt. Normal bowel sounds heard. Central nervous system: No focal neurological  deficits. Extremities: No cyanosis or clubbing. Skin: No petechiae. Psychiatry: Judgement and insight appear normal.  No suicidal ideation or hallucinations.   Data Reviewed: I have personally reviewed following labs and imaging studies  CBC: Recent Labs  Lab 01/13/23 0430 01/14/23 0424 01/15/23 0429 01/16/23 0422 01/19/23 0446  WBC 13.1* 7.8 7.0 7.4 12.5*  HGB 11.8* 11.7* 11.1* 11.1* 11.8*  HCT 37.2 37.7 36.4 34.7* 38.3  MCV 90.3 92.2 90.1 89.4 88.7  PLT 237 229 230 235 297   Basic Metabolic Panel: Recent Labs  Lab 01/13/23 0430 01/14/23 0424 01/15/23 0429 01/16/23 0422 01/19/23 0446  NA 139 140 138 139 137  K 4.0 4.4 4.2 4.1 4.8  CL 105 101 99 99 100  CO2 25 29 30 30 28   GLUCOSE 122* 98 96 91 140*  BUN 21 33* 29* 27* 29*  CREATININE 0.80 0.86 0.76 0.83 0.99  CALCIUM 9.1 9.0 9.3 9.2 9.0  MG  --   --   --  2.5* 2.6*   GFR: Estimated Creatinine Clearance: 59.7 mL/min (by C-G formula based on SCr of 0.99 mg/dL).  CBG: Recent Labs  Lab 01/18/23 1632 01/18/23 2114 01/19/23 0733 01/19/23 1131 01/19/23 1624  GLUCAP 163* 122* 133* 111* 112*    Recent Results (from the past 240 hours)  Respiratory (~20 pathogens) panel by PCR     Status: Abnormal   Collection Time: 01/13/23  1:32 PM   Specimen: Nasopharyngeal Swab; Respiratory  Result Value Ref Range Status   Adenovirus NOT DETECTED NOT DETECTED Final   Coronavirus 229E NOT DETECTED NOT DETECTED Final    Comment: (NOTE) The Coronavirus on the Respiratory Panel, DOES NOT test for the novel  Coronavirus (2019 nCoV)    Coronavirus HKU1 NOT DETECTED NOT DETECTED Final   Coronavirus NL63 NOT DETECTED NOT DETECTED Final   Coronavirus OC43 NOT DETECTED NOT DETECTED Final   Metapneumovirus NOT DETECTED NOT DETECTED Final   Rhinovirus / Enterovirus NOT DETECTED NOT DETECTED Final   Influenza A NOT DETECTED NOT DETECTED Final   Influenza B NOT DETECTED NOT DETECTED Final   Parainfluenza Virus 1 NOT DETECTED NOT  DETECTED Final   Parainfluenza Virus 2 NOT DETECTED NOT DETECTED Final   Parainfluenza Virus 3 NOT DETECTED NOT DETECTED Final   Parainfluenza Virus 4 DETECTED (A) NOT DETECTED Final   Respiratory Syncytial Virus NOT DETECTED NOT DETECTED Final   Bordetella pertussis NOT DETECTED NOT DETECTED Final   Bordetella Parapertussis NOT DETECTED NOT DETECTED Final   Chlamydophila pneumoniae NOT DETECTED NOT DETECTED Final   Mycoplasma pneumoniae NOT DETECTED NOT DETECTED Final    Comment: Performed at [facility], [address] [place], [place] [address]  SARS Coronavirus 2 by RT PCR (hospital order, performed in [facility] hospital lab) *cepheid single result test* Anterior Nasal Swab     Status: None   Collection Time: 01/14/23  9:50 AM   Specimen: Anterior Nasal Swab  Result Value Ref Range Status   SARS Coronavirus 2 by RT PCR NEGATIVE NEGATIVE Final    Comment: (NOTE) SARS-CoV-2 target nucleic acids are NOT DETECTED.  The SARS-CoV-2 RNA is generally detectable in upper and lower respiratory specimens during the acute phase of infection. The lowest concentration of SARS-CoV-2 viral copies this assay can detect is 250 copies / mL. A negative result does not preclude SARS-CoV-2 infection and should not be used as the sole basis for treatment or other patient management decisions.  A negative result may occur with improper specimen collection / handling, submission of specimen other than nasopharyngeal swab, presence of viral mutation(s) within the areas targeted by this assay, and inadequate number of viral copies (<250 copies / mL). A negative result must be combined with clinical observations, patient history, and epidemiological information.  Fact Sheet for Patients:   RoadLapTop.co.za  Fact Sheet for Healthcare Providers: http://kim-miller.com/  This test is not yet approved or  cleared by the [place] FDA and has been  authorized for detection and/or diagnosis of SARS-CoV-2 by FDA under an Emergency Use Authorization (EUA).  This EUA will remain in effect (meaning this test can be used) for the duration of the COVID-19 declaration under Section 564(b)(1) of the Act, 21 U.S.C. section 360bbb-3(b)(1), unless the authorization is terminated or revoked sooner.  Performed at [facility], [address], [place], [place] [address]      Radiology Studies: No results found.   Scheduled Meds:  acetylcysteine  4 mL Nebulization TID   amLODipine  5 mg Oral Daily   aspirin EC  81 mg Oral Daily   budesonide (PULMICORT) nebulizer solution  0.5 mg Nebulization BID   cholecalciferol  1,000 Units Oral Daily   [START ON 02/06/2023] cyanocobalamin  1,000 mcg Intramuscular Q30 days   famotidine  20 mg Oral Daily   guaiFENesin-dextromethorphan  10 mL Oral Q8H   insulin aspart  0-5 Units Subcutaneous QHS   insulin  aspart  0-9 Units Subcutaneous TID WC   letrozole  2.5 mg Oral Daily   levalbuterol  1.25 mg Nebulization TID   losartan  100 mg Oral Daily   methylPREDNISolone sodium succinate  40 mg Intravenous Q12H   rosuvastatin  5 mg Oral Daily   venlafaxine XR  75 mg Oral QHS     LOS: 7 days    Time spent: 35 minutes    Vassie Loll, MD [facility]  If 7PM-7AM, please contact night-coverage [email or web address] 01/19/2023, 6:02 PM

## 2023-01-19 NOTE — TOC Progression Note (Signed)
Transition of Care [facility]) - Progression Note    Patient Details  Name: Victoria Dean MRN: [id number] Date of Birth: 08-Jul-1950  Transition of [facility]) CM/SW Contact  Leitha Bleak, RN Phone Number: 01/19/2023, 10:40 AM  Clinical Narrative:   Discussed bed offers with patient and daughter. They are agreeable to [facility]. Insurance auth started. TOC attempted yesterday, Talbot Grumbling was down. MD updated Auth pending.     Expected Discharge Plan: Skilled Nursing Facility Barriers to Discharge: Insurance Authorization  Expected Discharge Plan and Services In-house Referral: Clinical Social Work Discharge Planning Services: CM Consult Post Acute Care Choice: Home Health Living arrangements for the past 2 months: Single Family Home                           HH Arranged: PT, OT HH Agency: [facility] Date [facility] Agency Contacted: 01/15/23   Representative spoke with at [facility] Agency: Kandee Keen   Social Determinants of Health (SDOH) Interventions SDOH Screenings   Food Insecurity: No Food Insecurity (01/12/2023)  Housing: Low Risk  (01/12/2023)  Transportation Needs: No Transportation Needs (01/12/2023)  Utilities: Not At Risk (01/12/2023)  Alcohol Screen: Low Risk  (10/23/2021)  Depression (PHQ2-9): Low Risk  (10/23/2021)  Financial Resource Strain: Low Risk  (10/23/2021)  Physical Activity: Inactive (02/16/2018)  Social Connections: Moderately Isolated (10/23/2021)  Stress: No Stress Concern Present (02/16/2018)  Tobacco Use: Medium Risk (01/12/2023)    Readmission Risk Interventions    01/12/2023   12:48 PM  Readmission Risk Prevention Plan  Medication Screening Complete  Transportation Screening Complete

## 2023-01-19 NOTE — Telephone Encounter (Signed)
Patient in the hospital at [facility] requested to cancel her appointment and someone call her back to r/s Lab/MD/Zometa

## 2023-01-20 DIAGNOSIS — J189 Pneumonia, unspecified organism: Secondary | ICD-10-CM | POA: Diagnosis not present

## 2023-01-20 DIAGNOSIS — J9601 Acute respiratory failure with hypoxia: Secondary | ICD-10-CM | POA: Diagnosis not present

## 2023-01-20 LAB — GLUCOSE, CAPILLARY
Glucose-Capillary: 116 mg/dL — ABNORMAL HIGH (ref 70–99)
Glucose-Capillary: 101 mg/dL — ABNORMAL HIGH (ref 70–99)
Glucose-Capillary: 117 mg/dL — ABNORMAL HIGH (ref 70–99)
Glucose-Capillary: 117 mg/dL — ABNORMAL HIGH (ref 70–99)

## 2023-01-20 NOTE — Progress Notes (Signed)
Mobility Specialist Progress Note:    01/20/23 1416  Mobility  Activity Ambulated with assistance in hallway  Level of Assistance Standby assist, set-up cues, supervision of patient - no hands on  Assistive Device Cane  Distance Ambulated (ft) 70 ft  Range of Motion/Exercises Active;All extremities  Activity Response Tolerated well  Mobility Referral Yes  Mobility visit 1 Mobility  Mobility Specialist Start Time (ACUTE ONLY) 1330  Mobility Specialist Stop Time (ACUTE ONLY) 1350  Mobility Specialist Time Calculation (min) (ACUTE ONLY) 20 min   Pt received sitting EOB, agreeable to mobility. Required supervision to stand and ambulate with cane. Tolerated well, SpO2 94% on RA at rest. SpO2 87% on RA during ambulation. C/o lightheadedness. SpO2 93% on 1L during ambulation. Lightheadedness eased with 1L. Returned to room, left pt sitting EOB. SpO2 95% on RA after session. Nurse notified. All needs met.   Lawerance Bach Mobility Specialist Please contact via Special educational needs teacher or  Rehab office at [phone]

## 2023-01-20 NOTE — Progress Notes (Signed)
Physical Therapy Treatment Patient Details Name: Victoria Dean MRN: [id number] DOB: Apr 08, 1950 Today's Date: 01/20/2023   History of Present Illness Victoria Dean is a 72 y.o. female with medical history significant for breast cancer on letrozole, chronic anxiety/depression, hypertension, hyperlipidemia, GERD, severe morbid obesity, who initially presented to [facility] ED where she left AMA due to lengthy wait time.  Has been having progressively worsening shortness of breath, nonproductive cough, and palpitations for the last several days.  No reported subjective fevers or chills.     In the ED, tachycardic and tachypneic with new oxygen requirement.  BP 125/68, pulse 108, respiration rate 25, O2 saturation 92% on 4 L.       CT angio chest was negative for pulmonary embolism however showed bronchitis with patchy areas of bronchiolitis/bronchopneumonia in the bilateral lungs.  The patient was started on empiric IV antibiotic Rocephin and azithromycin.  Admitted by [facility], hospitalist service.    PT Comments  Patient presents seated at bedside and agreeable for therapy.  Patient demonstrates good return for completing BLE ROM/strengthening exercises while seated at bedside, put on room air during gait training and able to ambulate in room/hallway without loss of balance using SPC, limited mostly due to c/o SOB with SpO2 dropping from 95% to 87%.  Patient tolerated sitting up at bedside with SpO2 increasing to 97% while on room air - nursing staff notified.  Patient will benefit from continued skilled physical therapy in hospital and recommended venue below to increase strength, balance, endurance for safe ADLs and gait.    If plan is discharge home, recommend the following: A little help with walking and/or transfers;A little help with bathing/dressing/bathroom;Assistance with cooking/housework;Assist for transportation;Help with stairs or ramp for entrance   Can travel by private vehicle     Yes  Equipment  Recommendations  Rolling walker (2 wheels)    Recommendations for Other Services       Precautions / Restrictions Precautions Precautions: Fall Restrictions Weight Bearing Restrictions Per Provider Order: No     Mobility  Bed Mobility               General bed mobility comments: Patient presents seated at EOB    Transfers Overall transfer level: Needs assistance Equipment used: Straight cane Transfers: Sit to/from Stand, Bed to chair/wheelchair/BSC Sit to Stand: Supervision   Step pivot transfers: Supervision       General transfer comment: slightly labored movement    Ambulation/Gait Ambulation/Gait assistance: Contact guard assist, Min assist Gait Distance (Feet): 65 Feet Assistive device: Straight cane Gait Pattern/deviations: Decreased step length - right, Decreased step length - left, Step-to pattern, Decreased stride length Gait velocity: decreased     General Gait Details: slightly labored labored movement without loss of balance, limited mostly due to c/o SOB and fatigue, on room air with SpO2 dropping from 95% to 87%   Stairs             Wheelchair Mobility     Tilt Bed    Modified Rankin (Stroke Patients Only)       Balance Overall balance assessment: Needs assistance Sitting-balance support: Feet supported, No upper extremity supported Sitting balance-Leahy Scale: Good Sitting balance - Comments: seated at EOB   Standing balance support: During functional activity, Single extremity supported Standing balance-Leahy Scale: Fair Standing balance comment: fair/good using RW  Cognition Arousal: Alert Behavior During Therapy: WFL for tasks assessed/performed Overall Cognitive Status: Within Functional Limits for tasks assessed                                          Exercises General Exercises - Lower Extremity Long Arc Quad: Seated, AROM, Strengthening, Both, 10  reps Hip Flexion/Marching: Seated, AROM, Strengthening, Both, 10 reps Toe Raises: Seated, AROM, Strengthening, Both, 20 reps Heel Raises: Seated, AROM, Both, 20 reps, Strengthening    General Comments        Pertinent Vitals/Pain Pain Assessment Pain Assessment: 0-10 Pain Score: 6  Pain Location: low back and right hip Pain Descriptors / Indicators: Sore, Discomfort, Aching Pain Intervention(s): Limited activity within patient's tolerance, Monitored during session, Repositioned    Home Living                          Prior Function            PT Goals (current goals can now be found in the care plan section) Acute Rehab PT Goals Patient Stated Goal: return home after rehab PT Goal Formulation: With patient Time For Goal Achievement: 01/26/23 Potential to Achieve Goals: Good Progress towards PT goals: Progressing toward goals    Frequency    Min 3X/week      PT Plan      Co-evaluation              AM-PAC PT "6 Clicks" Mobility   Outcome Measure  Help needed turning from your back to your side while in a flat bed without using bedrails?: None Help needed moving from lying on your back to sitting on the side of a flat bed without using bedrails?: A Little Help needed moving to and from a bed to a chair (including a wheelchair)?: A Little Help needed standing up from a chair using your arms (e.g., wheelchair or bedside chair)?: None Help needed to walk in hospital room?: A Little Help needed climbing 3-5 steps with a railing? : A Little 6 Click Score: 20    End of Session Equipment Utilized During Treatment: Oxygen Activity Tolerance: Patient tolerated treatment well;Patient limited by fatigue Patient left: in bed;with call bell/phone within reach Nurse Communication: Mobility status PT Visit Diagnosis: Unsteadiness on feet (R26.81);Other abnormalities of gait and mobility (R26.89);Muscle weakness (generalized) (M62.81)     Time:  2130-8657 PT Time Calculation (min) (ACUTE ONLY): 20 min  Charges:    $Gait Training: 8-22 mins $Therapeutic Exercise: 8-22 mins PT General Charges $$ ACUTE PT VISIT: 1 Visit                     12:41 PM, 01/20/23 Ocie Bob, MPT Physical Therapist with [facility] 336 [phone] office [phone] mobile phone

## 2023-01-20 NOTE — Progress Notes (Signed)
   01/20/23 1517  Spiritual Encounters  Type of Visit Initial  Care provided to: Patient  Conversation partners present during encounter Nurse  Reason for visit Routine spiritual support   Chaplain Assessment  Chaplain's Reason for Visit: Chaplain identified the Pt as one who had been on the floor for 8 days and visited the Pt to establish a relationship of support from spiritual care Chaplain time spent: 25 minutes Chaplain Interventions: Cultivated a relationship of care and support via reflective listening and compassionate presence. Explored spiritual, Emotional, and Relational needs and resources. Explored Pt's emotions regarding her health and next steps Explore Pt's family and church support Chaplain provided prayer when Pt asked for it. Chaplain Assessment: Pt Needs Spiritual: Pt expressed feeling worried and anxious about her health and her discharge arrangements Emotional: Pt expressed that as someone coming from the health care context, she is "the worst pt ever" because of all that she knows Relational: none identified Intermediate hopes: Finding a suitable discharge plan that will give her peace and confidence that she is safe, while also being covered by insurance. Ultimate hopes: asking her higher power to "allow her to live long enough to see her granddaughter graduate high school and maybe see her get married" Pt Resources  Resources Identified:  Pt has a professed faith that she turns to when anxious and worried. Pt has a good relationship with immediate family and with her church family Pt possess an inner strength and confidence in her ability to recover  (1- Could benefit from support; 2- Supported; 3-Strongly Supported) Spiritual: 3-Strongly Supported Emotional: 2- Supported Relational: 3-Strongly Supported Additional Assessment: Pt was welcoming and kind to the chaplain, and easy to talk to. Pt stated that when the ambulance brought her to the ED on 12 liters  of air she was "more scared than I have ever been.  I was looking for that white light." Pt stated, "If I am still here, the Shaune Pollack must have more for me to do here."  Patient Outcomes: Pt expressed trust in his higher power (God) Pt encouraged. Pt Verbally and tearfully processed emotions  Chaplaincy Plan: Chaplain will continue to follow Pt while she is present in [facility]. Chaplain Raelene Bott, MDiv [email or web address]

## 2023-01-20 NOTE — TOC Progression Note (Signed)
Transition of Care [facility]) - Progression Note    Patient Details  Name: Victoria Dean MRN: [id number] Date of Birth: 06-26-1950  Transition of Care [facility]) CM/SW Contact  Leitha Bleak, RN Phone Number: 01/20/2023, 4:03 PM  Clinical Narrative:   TOC checking with Navi, sending new clinicals as requested. Per Talbot Grumbling they are reviewing chart. RN/ MD updated    Expected Discharge Plan: Skilled Nursing Facility Barriers to Discharge: Insurance Authorization  Expected Discharge Plan and Services In-house Referral: Clinical Social Work Discharge Planning Services: CM Consult Post Acute Care Choice: Home Health Living arrangements for the past 2 months: Single Family Home                           HH Arranged: PT, OT HH Agency: [facility] Date [facility] Agency Contacted: 01/15/23   Representative spoke with at [facility] Agency: Kandee Keen   Social Determinants of Health (SDOH) Interventions SDOH Screenings   Food Insecurity: No Food Insecurity (01/12/2023)  Housing: Low Risk  (01/12/2023)  Transportation Needs: No Transportation Needs (01/12/2023)  Utilities: Not At Risk (01/12/2023)  Alcohol Screen: Low Risk  (10/23/2021)  Depression (PHQ2-9): Low Risk  (10/23/2021)  Financial Resource Strain: Low Risk  (10/23/2021)  Physical Activity: Inactive (02/16/2018)  Social Connections: Moderately Isolated (10/23/2021)  Stress: No Stress Concern Present (02/16/2018)  Tobacco Use: Medium Risk (01/12/2023)    Readmission Risk Interventions    01/12/2023   12:48 PM  Readmission Risk Prevention Plan  Medication Screening Complete  Transportation Screening Complete

## 2023-01-20 NOTE — Progress Notes (Signed)
Mobility Specialist Progress Note:    01/20/23 0950  Mobility  Activity Ambulated with assistance in hallway  Level of Assistance Standby assist, set-up cues, supervision of patient - no hands on  Assistive Device Cane  Distance Ambulated (ft) 60 ft  Range of Motion/Exercises Active;All extremities  Activity Response Tolerated well  Mobility Referral Yes  Mobility visit 1 Mobility  Mobility Specialist Start Time (ACUTE ONLY) 0935  Mobility Specialist Stop Time (ACUTE ONLY) 0950  Mobility Specialist Time Calculation (min) (ACUTE ONLY) 15 min   Pt received sitting EOB, agreeable to mobility. Required supervision to stand and ambulate with cane. Tolerated well, SpO2 90% on 2L during ambulation. C/o SOB. Returned to room, left sitting EOB. SpO2 93% on 2L. All needs met.   Lawerance Bach Mobility Specialist Please contact via Special educational needs teacher or  Rehab office at [phone]

## 2023-01-20 NOTE — Progress Notes (Signed)
SATURATION QUALIFICATIONS: (This note is used to comply with regulatory documentation for home oxygen)  Patient Saturations on Room Air at Rest = 94%  Patient Saturations on Room Air while Ambulating = 87%  Patient Saturations on 1 Liters of oxygen while Ambulating = 93%  Please briefly explain why patient needs home oxygen:

## 2023-01-20 NOTE — Progress Notes (Signed)
PROGRESS NOTE    Victoria Dean  YTK:[id number] DOB: 1950-07-07 DOA: 01/12/2023 PCP: Oneal Grout, FNP   Brief Narrative:    Victoria Dean is a 72 y.o. female with medical history significant for breast cancer on letrozole, chronic anxiety/depression, hypertension, hyperlipidemia, GERD, severe morbid obesity, who initially presented to [facility] ED where she left AMA due to lengthy wait time.  Has been having progressively worsening shortness of breath, nonproductive cough, and palpitations for the last several days.  No reported subjective fevers or chills.  She was admitted with acute hypoxemic respiratory failure secondary to parainfluenza infection along with multifocal community-acquired pneumonia.  Working on oxygen weaning, but she now appears dependent and continues to remain symptomatic.  She appears to have reached near maximal medical benefit and is awaiting SNF placement.  Assessment & Plan:   Principal Problem:   CAP (community acquired pneumonia)  Assessment and Plan:   Acute hypoxic respiratory failure secondary pneumonia/parainfluenza   -Still having significant shortness of breath, especially with exertion, improved diffuse wheezing and rhonchi Still having pursed lip breathing -Was on 4 L of oxygen, wean down to 2 L this morning, satting 95% -Respiratory panel: positive for parainfluenza virus 4 - Not on oxygen supplementation at baseline -at home -Scheduling DuoNeb bronchodilators, adding Pulmicort nebs -Continue slow stable steroid tapering  -Patient has completed antibiotic therapy for superimposed bacterial pneumonia as mentioned below. -Continue mucolytics, spirometer, flutter valve and mucomyst   Multifocal pneumonia, POA-completed treatment -Likely superimposed bilateral infection -With acute respiratory failure, still was requiring 4 L of oxygen this morning down to 2 L, satting 95%  -Improve air movement bilaterally; still with appreciated rhonchi on exam.  2 L  nasal cannula supplementation in place with good saturation -No wheezing on exam. -Continue the use of flutter valve/incentive spirometer and as needed bronchodilator -CTA-negative for pulmonary embolism  -Continue as needed antitussive medications; patient no longer requiring antibiotics.   Elevated troponin, suspect demand ischemia in the setting of acute hypoxic respiratory failure -Denies any chest pain - High-sensitivity troponin peaked at 27 and downtrending. - Last 2D echo done on 06/29/2022 revealed LVEF 60 to 65% with no regional wall motion abnormalities. -Will discontinue telemetry.   Chronic anxiety/depression -Continue home anxiolytic/antidepressant regimen. -No suicidal ideation or hallucinations.   Hyperlipidemia -Continue statins -Continue to follow lipid panel and LFTs intermittently.   GERD -Continue PPI -Lifestyle changes discussed with patient.   Generalized weakness -PT OT assessment recommending SNF and patient currently appears interested, placement pending. -Facility has been accepted; awaiting insurance authorization.   Hyperglycemia -Last hemoglobin A1c 6.1 on 10/22/2021 -Updated A1c 5.8% -Continue sliding scale insulin.   Sinus tachycardia, suspect secondary to hypovolemia -Stable and well-controlled -Will discontinue telemetry. -Sinus rhythm and and no presence of arrhythmia.  Morbid obesity class III -BMI 43.4 -Low-calorie diet, portion control and increase physical activity discussed with patient.    DVT prophylaxis: SCDs Code Status: Full Family Communication: None at bedside Disposition Plan:  Status is: Inpatient Remains inpatient appropriate because: Need for IV medications  Consultants:  None  Procedures:  None  Antimicrobials:  Anti-infectives (From admission, onward)    Start     Dose/Rate Route Frequency Ordered Stop   01/13/23 0600  cefTRIAXone (ROCEPHIN) 2 g in sodium chloride 0.9 % 100 mL IVPB  Status:  Discontinued         2 g 200 mL/hr over 30 Minutes Intravenous Every 24 hours 01/12/23 0632 01/18/23 1006   01/13/23 0500  azithromycin (ZITHROMAX)  500 mg in sodium chloride 0.9 % 250 mL IVPB  Status:  Discontinued        500 mg 250 mL/hr over 60 Minutes Intravenous Every 24 hours 01/12/23 0632 01/12/23 0704   01/12/23 1000  azithromycin (ZITHROMAX) tablet 500 mg  Status:  Discontinued        500 mg Oral Daily 01/12/23 0704 01/18/23 1006   01/12/23 0445  cefTRIAXone (ROCEPHIN) 2 g in sodium chloride 0.9 % 100 mL IVPB        2 g 200 mL/hr over 30 Minutes Intravenous  Once 01/12/23 0439 01/12/23 0641   01/12/23 0445  azithromycin (ZITHROMAX) 500 mg in sodium chloride 0.9 % 250 mL IVPB  Status:  Discontinued        500 mg 250 mL/hr over 60 Minutes Intravenous Every 24 hours 01/12/23 0439 01/12/23 0632      Subjective: No fever, no chest pain, no nausea, no vomiting.  Using 2 L nasal cannula supplementation to maintain saturation.  Objective: Vitals:   01/20/23 0838 01/20/23 0913 01/20/23 1253 01/20/23 1413  BP:  135/71 (!) 133/49   Pulse:  89 93   Resp:  19 19   Temp:  98 F (36.7 C)    TempSrc:  Oral    SpO2: 100% 99% 94% 93%  Weight:      Height:        Intake/Output Summary (Last 24 hours) at 01/20/2023 1606 Last data filed at 01/20/2023 1250 Gross per 24 hour  Intake 480 ml  Output --  Net 480 ml   Filed Weights   01/12/23 0217  Weight: 111.1 kg    Examination: General exam: Alert, awake, oriented x 3; still requiring 2 L nasal cannula supplementation to maintain saturation.  Patient demonstrating some improvement in her physical capability per PT evaluation.  No chest pain, no nausea, no vomiting. Respiratory system: Positive scattered rhonchi; no using accessory muscles. Cardiovascular system:RRR. No rubs or gallops; no JVD. Gastrointestinal system: Abdomen is obese, nondistended, soft and nontender. No organomegaly or masses felt. Normal bowel sounds heard. Central nervous  system:  No focal neurological deficits. Extremities: No cyanosis or clubbing. Skin: No petechiae. Psychiatry: Judgement and insight appear normal.  No suicidal ideation or hallucination appreciated.  Following commands appropriately.  Data Reviewed: I have personally reviewed following labs and imaging studies  CBC: Recent Labs  Lab 01/14/23 0424 01/15/23 0429 01/16/23 0422 01/19/23 0446  WBC 7.8 7.0 7.4 12.5*  HGB 11.7* 11.1* 11.1* 11.8*  HCT 37.7 36.4 34.7* 38.3  MCV 92.2 90.1 89.4 88.7  PLT 229 230 235 297   Basic Metabolic Panel: Recent Labs  Lab 01/14/23 0424 01/15/23 0429 01/16/23 0422 01/19/23 0446  NA 140 138 139 137  K 4.4 4.2 4.1 4.8  CL 101 99 99 100  CO2 29 30 30 28   GLUCOSE 98 96 91 140*  BUN 33* 29* 27* 29*  CREATININE 0.86 0.76 0.83 0.99  CALCIUM 9.0 9.3 9.2 9.0  MG  --   --  2.5* 2.6*   GFR: Estimated Creatinine Clearance: 59.7 mL/min (by C-G formula based on SCr of 0.99 mg/dL).  CBG: Recent Labs  Lab 01/19/23 1131 01/19/23 1624 01/19/23 2033 01/20/23 0735 01/20/23 1113  GLUCAP 111* 112* 133* 116* 101*    Recent Results (from the past 240 hours)  Respiratory (~20 pathogens) panel by PCR     Status: Abnormal   Collection Time: 01/13/23  1:32 PM   Specimen: Nasopharyngeal Swab; Respiratory  Result  Value Ref Range Status   Adenovirus NOT DETECTED NOT DETECTED Final   Coronavirus 229E NOT DETECTED NOT DETECTED Final    Comment: (NOTE) The Coronavirus on the Respiratory Panel, DOES NOT test for the novel  Coronavirus (2019 nCoV)    Coronavirus HKU1 NOT DETECTED NOT DETECTED Final   Coronavirus NL63 NOT DETECTED NOT DETECTED Final   Coronavirus OC43 NOT DETECTED NOT DETECTED Final   Metapneumovirus NOT DETECTED NOT DETECTED Final   Rhinovirus / Enterovirus NOT DETECTED NOT DETECTED Final   Influenza A NOT DETECTED NOT DETECTED Final   Influenza B NOT DETECTED NOT DETECTED Final   Parainfluenza Virus 1 NOT DETECTED NOT DETECTED Final    Parainfluenza Virus 2 NOT DETECTED NOT DETECTED Final   Parainfluenza Virus 3 NOT DETECTED NOT DETECTED Final   Parainfluenza Virus 4 DETECTED (A) NOT DETECTED Final   Respiratory Syncytial Virus NOT DETECTED NOT DETECTED Final   Bordetella pertussis NOT DETECTED NOT DETECTED Final   Bordetella Parapertussis NOT DETECTED NOT DETECTED Final   Chlamydophila pneumoniae NOT DETECTED NOT DETECTED Final   Mycoplasma pneumoniae NOT DETECTED NOT DETECTED Final    Comment: Performed at [facility], [address] [place], [place] [address]  SARS Coronavirus 2 by RT PCR (hospital order, performed in [facility] hospital lab) *cepheid single result test* Anterior Nasal Swab     Status: None   Collection Time: 01/14/23  9:50 AM   Specimen: Anterior Nasal Swab  Result Value Ref Range Status   SARS Coronavirus 2 by RT PCR NEGATIVE NEGATIVE Final    Comment: (NOTE) SARS-CoV-2 target nucleic acids are NOT DETECTED.  The SARS-CoV-2 RNA is generally detectable in upper and lower respiratory specimens during the acute phase of infection. The lowest concentration of SARS-CoV-2 viral copies this assay can detect is 250 copies / mL. A negative result does not preclude SARS-CoV-2 infection and should not be used as the sole basis for treatment or other patient management decisions.  A negative result may occur with improper specimen collection / handling, submission of specimen other than nasopharyngeal swab, presence of viral mutation(s) within the areas targeted by this assay, and inadequate number of viral copies (<250 copies / mL). A negative result must be combined with clinical observations, patient history, and epidemiological information.  Fact Sheet for Patients:   RoadLapTop.co.za  Fact Sheet for Healthcare Providers: http://kim-miller.com/  This test is not yet approved or  cleared by the [place] FDA and has been authorized for  detection and/or diagnosis of SARS-CoV-2 by FDA under an Emergency Use Authorization (EUA).  This EUA will remain in effect (meaning this test can be used) for the duration of the COVID-19 declaration under Section 564(b)(1) of the Act, 21 U.S.C. section 360bbb-3(b)(1), unless the authorization is terminated or revoked sooner.  Performed at [facility], [address] [place], [place] [address]      Radiology Studies: No results found.   Scheduled Meds:  acetylcysteine  4 mL Nebulization TID   amLODipine  5 mg Oral Daily   aspirin EC  81 mg Oral Daily   budesonide (PULMICORT) nebulizer solution  0.5 mg Nebulization BID   cholecalciferol  1,000 Units Oral Daily   [START ON 02/06/2023] cyanocobalamin  1,000 mcg Intramuscular Q30 days   famotidine  20 mg Oral Daily   guaiFENesin-dextromethorphan  10 mL Oral Q8H   insulin aspart  0-5 Units Subcutaneous QHS   insulin aspart  0-9 Units Subcutaneous TID WC   letrozole  2.5 mg Oral Daily   levalbuterol  1.25 mg Nebulization TID   losartan  100 mg Oral Daily   methylPREDNISolone sodium succinate  40 mg Intravenous Q12H   rosuvastatin  5 mg Oral Daily   venlafaxine XR  75 mg Oral QHS     LOS: 8 days    Time spent: 35 minutes   Vassie Loll, MD [facility]  If 7PM-7AM, please contact night-coverage [email or web address] 01/20/2023, 4:06 PM

## 2023-01-20 NOTE — Plan of Care (Signed)
  Problem: Education: Goal: Knowledge of General Education information will improve Description: Including pain rating scale, medication(s)/side effects and non-pharmacologic comfort measures Outcome: Progressing   Problem: Nutrition: Goal: Adequate nutrition will be maintained Outcome: Progressing   Problem: Coping: Goal: Level of anxiety will decrease Outcome: Progressing   Problem: Education: Goal: Knowledge of General Education information will improve Description: Including pain rating scale, medication(s)/side effects and non-pharmacologic comfort measures Outcome: Progressing   Problem: Nutrition: Goal: Adequate nutrition will be maintained Outcome: Progressing   Problem: Nutrition: Goal: Adequate nutrition will be maintained Outcome: Progressing   Problem: Coping: Goal: Level of anxiety will decrease Outcome: Progressing

## 2023-01-20 NOTE — Plan of Care (Signed)

## 2023-01-21 ENCOUNTER — Ambulatory Visit: Payer: Medicare PPO

## 2023-01-21 ENCOUNTER — Ambulatory Visit: Payer: Medicare PPO | Admitting: Oncology

## 2023-01-21 ENCOUNTER — Other Ambulatory Visit: Payer: Medicare PPO

## 2023-01-21 DIAGNOSIS — J9601 Acute respiratory failure with hypoxia: Secondary | ICD-10-CM | POA: Diagnosis not present

## 2023-01-21 DIAGNOSIS — J189 Pneumonia, unspecified organism: Secondary | ICD-10-CM | POA: Diagnosis not present

## 2023-01-21 LAB — GLUCOSE, CAPILLARY
Glucose-Capillary: 120 mg/dL — ABNORMAL HIGH (ref 70–99)
Glucose-Capillary: 126 mg/dL — ABNORMAL HIGH (ref 70–99)
Glucose-Capillary: 105 mg/dL — ABNORMAL HIGH (ref 70–99)

## 2023-01-21 MED ORDER — PREDNISONE 20 MG PO TABS: ORAL_TABLET | ORAL | 0 refills | Status: DC

## 2023-01-21 MED ORDER — GUAIFENESIN-DM 100-10 MG/5ML PO SYRP: 10.0000 mL | ORAL_SOLUTION | Freq: Three times a day (TID) | ORAL | 0 refills | Status: DC | PRN

## 2023-01-21 MED ORDER — PANTOPRAZOLE SODIUM 40 MG PO TBEC: 40.0000 mg | DELAYED_RELEASE_TABLET | Freq: Every day | ORAL | 1 refills | Status: AC

## 2023-01-21 NOTE — TOC Transition Note (Signed)
Transition of Care [facility]) - Discharge Note   Patient Details  Name: Victoria Dean MRN: [id number] Date of Birth: [birth year]  [facility] CM/SW Contact:  Leitha Bleak, RN Phone Number: 01/21/2023, 1:14 PM   Clinical Narrative:   Insurance is denying SNF CM spoke with her daughter, Victorino Dike, They had discussed and had a plan prepared for her to go home with Jennifer's mother in law with [facility] home health PT.   RN/Cory updated. Victorino Dike will come to transport patient at discharge.    Final next level of care: Home w Home Health Services Barriers to Discharge: Barriers Resolved   Patient Goals and CMS Choice Patient states their goals for this hospitalization and ongoing recovery are:: going to stay with a friend with home health CMS Medicare.gov Compare Post Acute Care list provided to:: Patient Choice offered to / list presented to : Patient     Discharge Placement              Patient to be transferred to facility by: Daughter Name of family member notified: Victorino Dike Patient and family notified of of transfer: 01/21/23  Discharge Plan and Services Additional resources added to the After Visit Summary for   In-house Referral: Clinical Social Work Discharge Planning Services: CM Consult Post Acute Care Choice: Home Health                  HH Arranged: PT, OT [facility] Agency: [facility] Date [facility] Agency Contacted: 01/15/23   Representative spoke with at [facility] Agency: Kandee Keen  Social Drivers of Health (SDOH) Interventions SDOH Screenings   Food Insecurity: No Food Insecurity (01/12/2023)  Housing: Low Risk  (01/12/2023)  Transportation Needs: No Transportation Needs (01/12/2023)  Utilities: Not At Risk (01/12/2023)  Alcohol Screen: Low Risk  (10/23/2021)  Depression (PHQ2-9): Low Risk  (10/23/2021)  Financial Resource Strain: Low Risk  (10/23/2021)  Physical Activity: Inactive (02/16/2018)  Social Connections: Moderately Isolated (10/23/2021)  Stress: No  Stress Concern Present (02/16/2018)  Tobacco Use: Medium Risk (01/12/2023)    Readmission Risk Interventions    01/12/2023   12:48 PM  Readmission Risk Prevention Plan  Medication Screening Complete  Transportation Screening Complete

## 2023-01-21 NOTE — Discharge Summary (Signed)
Physician Discharge Summary   Patient: Victoria Dean MRN: [id number] DOB: [birth year]  Admit date:     01/12/2023  Discharge date: 01/21/23  Discharge Physician: Vassie Loll   PCP: Oneal Grout, FNP   Recommendations at discharge:  Repeat chest x-ray in 6-8 weeks to assure resolution of infiltrates. Continue assisting patient with weight loss management. Patient to continue outpatient follow-up with oncology service as previously instructed. Continue close monitoring of patient's CBGs/A1c with determination for further hypoglycemic regimen.  Discharge Diagnoses: Principal Problem:   CAP (community acquired pneumonia) Active Problems:   Acute respiratory failure with hypoxia (HCC) Morbid obesity Anxiety/depression Hyperlipidemia Gastroesophageal flux disease   Brief Hospital admission course: Victoria Dean is a 72 y.o. female with medical history significant for breast cancer on letrozole, chronic anxiety/depression, hypertension, hyperlipidemia, GERD, severe morbid obesity, who initially presented to [facility] ED where she left AMA due to lengthy wait time.  Has been having progressively worsening shortness of breath, nonproductive cough, and palpitations for the last several days.  No reported subjective fevers or chills.  She was admitted with acute hypoxemic respiratory failure secondary to parainfluenza infection along with multifocal community-acquired pneumonia.  Working on oxygen weaning, but she now appears dependent and continues to remain symptomatic.  She appears to have reached near maximal medical benefit and is awaiting SNF placement.   Assessment and Plan: Acute hypoxic respiratory failure secondary pneumonia/parainfluenza  -Still having significant shortness of breath, especially with exertion, improved diffuse wheezing and rhonchi Still having pursed lip breathing -Was on 4 L of oxygen at time of admission. -At discharge no oxygen requirement appreciated.  Patient has  maintain saturation above 90% on room air. -Respiratory panel: positive for parainfluenza virus 4 -Continue slow prednisone tapering at discharge. -Patient has completed antibiotic therapy for superimposed bacterial pneumonia as mentioned below. -Continue mucolytics, spirometer, as needed bronchodilators and flutter valve.   Multifocal pneumonia, POA-completed treatment -Likely superimposed bilateral infection -With acute respiratory failure requiring 4 L supplementation at time of admission; at discharge no oxygen supplementation required. -Improve air movement bilaterally; still with appreciated rhonchi on exam, but no using accessory muscles and not requiring oxygen supplementation. -Patient has completed antibiotic therapy and will follow just a slow prednisone tapering at discharge. -Continue the use of flutter valve/incentive spirometer, mucolytic's and as needed bronchodilators. -CTA-negative for pulmonary embolism  -Continue as needed antitussive medications; patient no longer requiring antibiotics.  History of breast cancer -Continue patient follow-up with oncology service -Resume the use of letrozole.   Elevated troponin, suspect demand ischemia in the setting of acute hypoxic respiratory failure -Denies any chest pain - High-sensitivity troponin peaked at 27 and downtrending. - Last 2D echo done on 06/29/2022 revealed LVEF 60 to 65% with no regional wall motion abnormalities. -Telemetry demonstrating no arrhythmias; patient instructed to continue following low-sodium diet. -No chest pain at discharge.   Chronic anxiety/depression -Continue home anxiolytic/antidepressant regimen. -No suicidal ideation or hallucinations.   Hyperlipidemia -Continue statins -Continue to follow lipid panel and LFTs intermittently.   GERD -Continue PPI -Lifestyle changes discussed with patient.   Generalized weakness -PT OT assessment recommending SNF and patient currently appears  interested, placement pending. -Facility has been accepted; awaiting insurance authorization.   Hyperglycemia -Last hemoglobin A1c 6.1 on 10/22/2021 -Updated A1c 5.8% during this hospitalization -Modified carbohydrate diet has been recommended. -Patient has been using Ozempic and will follow up with PCP to further determine the need of hypoglycemic agent as an outpatient.   Sinus tachycardia, suspect secondary to  hypovolemia -Stable and resolved at time of discharge -Patient advised to maintain adequate hydration.  Well-controlled -Sinus rhythm and and no presence of arrhythmia identified while monitoring patient on telemetry..   Morbid obesity class III -BMI 43.4 -Low-calorie diet, portion control and increase physical activity discussed with patient.  Physical deconditioning -Patient evaluated by physical therapy with recommendation for home health PT at discharge.    Consultants: None Procedures performed: See below for x-ray reports. Disposition: Home with home health services  Diet recommendation: Heart healthy, modified carbohydrates and low calorie diet.  DISCHARGE MEDICATION: Allergies as of 01/21/2023       Reactions   Other Itching, Other (See Comments)   Brand name bandaids with cloth backing causes redness and itching   Statins    Muscle weakness   Stiolto Respimat [tiotropium Bromide-olodaterol]    Zetia [ezetimibe]    Leg pain, upset stomach   Sulfa Antibiotics Rash        Medication List     TAKE these medications    acetaminophen 650 MG CR tablet Commonly known as: TYLENOL Take 1,300 mg by mouth every 8 (eight) hours as needed for pain.   albuterol 108 (90 Base) MCG/ACT inhaler Commonly known as: VENTOLIN HFA Inhale 2 puffs into the lungs every 6 (six) hours as needed for wheezing or shortness of breath.   amLODipine 5 MG tablet Commonly known as: NORVASC Take by mouth.   aspirin EC 81 MG tablet Take 1 tablet (81 mg total) by mouth daily.  Swallow whole.   B-D 3CC LUER-LOK SYR 25GX1" 25G X 1" 3 ML Misc Generic drug: SYRINGE-NEEDLE (DISP) 3 ML USE 1 SYRINGE EVERY 30 DAYS   BIOFREEZE EX Apply 1 application topically daily as needed (back pain).   Biotin 1000 MCG tablet Take 1,000 mcg by mouth daily.   cholecalciferol 25 MCG (1000 UNIT) tablet Commonly known as: VITAMIN D3 Take 1,000 Units by mouth daily.   clobetasol 0.05 % external solution Commonly known as: TEMOVATE Apply topically daily as needed.   cyanocobalamin 1000 MCG/ML injection Commonly known as: VITAMIN B12 INJECT 1 ML (1,000 MCG TOTAL) INTO THE MUSCLE EVERY 30 (THIRTY) DAYS.   famotidine 10 MG tablet Commonly known as: PEPCID Take by mouth. Take 10 mg by mouth 2 (two) times daily as needed for Heartburn   guaiFENesin-dextromethorphan 100-10 MG/5ML syrup Commonly known as: ROBITUSSIN DM Take 10 mLs by mouth every 8 (eight) hours as needed for cough.   hydrocortisone 2.5 % lotion Apply 1 application topically daily as needed (when washing face).   letrozole 2.5 MG tablet Commonly known as: FEMARA TAKE 1 TABLET EVERY DAY   losartan 100 MG tablet Commonly known as: COZAAR TAKE 1 TABLET EVERY DAY   mometasone 0.1 % cream Commonly known as: ELOCON Apply 1 application topically daily as needed (ear irritation).   Ozempic (0.25 or 0.5 MG/DOSE) 2 MG/1.5ML Sopn Generic drug: Semaglutide(0.25 or 0.5MG /DOS) Inject 0.25 mg every week by subcutaneous route for 28 days.   pantoprazole 40 MG tablet Commonly known as: Protonix Take 1 tablet (40 mg total) by mouth daily.   predniSONE 20 MG tablet Commonly known as: DELTASONE Take 3 tablets by mouth daily x 1 day; then 2 tablets by mouth daily x 2 days; then 1 tablet by mouth daily x 3 days; then half tablet by mouth daily x 3 days and stop prednisone.   rosuvastatin 5 MG tablet Commonly known as: CRESTOR Take 1 tablet (5 mg total) by mouth daily.  Systane Complete 0.6 % Soln Generic drug:  Propylene Glycol Apply to eye.   venlafaxine XR 75 MG 24 hr capsule Commonly known as: EFFEXOR-XR TAKE 3 CAPSULES (225 MG TOTAL) BY MOUTH DAILY WITH BREAKFAST. What changed:  how much to take additional instructions   zoledronic acid 4 MG/5ML injection Commonly known as: ZOMETA Inject 4 mg into the vein once.        Follow-up Information     [facility], [facility] Follow up.   Specialty: Home Health Services Why: [facility] home health staff will call you to schedule PT/OT visits Contact information: [address] [place] [address] [phone]         Oneal Grout, FNP. Schedule an appointment as soon as possible for a visit in 10 day(s).   Specialty: Family Medicine Contact information: [address] [place] [address] [phone]                Discharge Exam: Ceasar Mons Weights   01/12/23 0217  Weight: 111.1 kg   General exam: Alert, awake, oriented x 3; in no acute distress and feeling much better today.  No requiring oxygen supplementation and expressing just mild nonproductive coughing spells. Respiratory system: Positive scattered rhonchi, no wheezing, no using accessory muscles and speaking in full sentences. Cardiovascular system:RRR. No rubs or gallops; no JVD. Gastrointestinal system: Abdomen is obese, nondistended, soft and nontender. No organomegaly or masses felt. Normal bowel sounds heard. Central nervous system: Alert and oriented. No focal neurological deficits. Extremities: No cyanosis or clubbing. Skin: No petechiae. Psychiatry: Judgement and insight appear normal. Mood & affect appropriate.    Condition at discharge: Stable and improved.  The results of significant diagnostics from this hospitalization (including imaging, microbiology, ancillary and laboratory) are listed below for reference.   Imaging Studies: CT Angio Chest Pulmonary Embolism (PE) W or WO Contrast Result Date: 01/12/2023 CLINICAL DATA:  Pulmonary  embolism suspected.  Shortness of breath. EXAM: CT ANGIOGRAPHY CHEST WITH CONTRAST TECHNIQUE: Multidetector CT imaging of the chest was performed using the standard protocol during bolus administration of intravenous contrast. Multiplanar CT image reconstructions and MIPs were obtained to evaluate the vascular anatomy. RADIATION DOSE REDUCTION: This exam was performed according to the departmental dose-optimization program which includes automated exposure control, adjustment of the mA and/or kV according to patient size and/or use of iterative reconstruction technique. CONTRAST:  OMNIPAQUE IOHEXOL 350 MG/ML SOLN COMPARISON:  01/09/2021 FINDINGS: Cardiovascular: Suboptimal although satisfactory opacification of the pulmonary arteries to the segmental level. There is further intermittent limitation from motion artifact. No evidence of pulmonary embolism. Normal heart size. No pericardial effusion. Extensive coronary atheromatous calcification. Mediastinum/Nodes: Negative for mass or adenopathy. Lungs/Pleura: Airway thickening with areas of patchy clustered nodularity in the right more than left lung. No lobar consolidation, edema, effusion, or pneumothorax. Upper Abdomen: Negative Musculoskeletal: Generalized spondylitic spurring and primarily upper lumbar disc space narrowing. Prominent posterior spur the midthoracic spine. Other: Postoperative left breast. Review of the MIP images confirms the above findings. IMPRESSION: 1. Bronchitis with patchy areas of bronchiolitis/bronchopneumonia in the bilateral lungs. 2. Motion degraded chest CTA with no evidence of pulmonary embolism. 3. Atherosclerosis, including the coronary arteries. Electronically Signed   By: Tiburcio Pea M.D.   On: 01/12/2023 04:27   DG Chest 2 View Result Date: 01/11/2023 CLINICAL DATA:  Shortness of breath with chest pain. EXAM: CHEST - 2 VIEW COMPARISON:  Radiographs 06/28/2022 and 01/19/2021.  CT 01/19/2021. FINDINGS: The heart size  and mediastinal contours  are normal. The lungs are clear. There is no pleural effusion or pneumothorax. No acute osseous findings are identified. Postsurgical changes in the left breast and left axilla. Degenerative changes throughout the thoracic spine. IMPRESSION: No evidence of acute cardiopulmonary process. Electronically Signed   By: Carey Bullocks M.D.   On: 01/11/2023 14:51    Microbiology: Results for orders placed or performed during the hospital encounter of 01/12/23  Respiratory (~20 pathogens) panel by PCR     Status: Abnormal   Collection Time: 01/13/23  1:32 PM   Specimen: Nasopharyngeal Swab; Respiratory  Result Value Ref Range Status   Adenovirus NOT DETECTED NOT DETECTED Final   Coronavirus 229E NOT DETECTED NOT DETECTED Final    Comment: (NOTE) The Coronavirus on the Respiratory Panel, DOES NOT test for the novel  Coronavirus (2019 nCoV)    Coronavirus HKU1 NOT DETECTED NOT DETECTED Final   Coronavirus NL63 NOT DETECTED NOT DETECTED Final   Coronavirus OC43 NOT DETECTED NOT DETECTED Final   Metapneumovirus NOT DETECTED NOT DETECTED Final   Rhinovirus / Enterovirus NOT DETECTED NOT DETECTED Final   Influenza A NOT DETECTED NOT DETECTED Final   Influenza B NOT DETECTED NOT DETECTED Final   Parainfluenza Virus 1 NOT DETECTED NOT DETECTED Final   Parainfluenza Virus 2 NOT DETECTED NOT DETECTED Final   Parainfluenza Virus 3 NOT DETECTED NOT DETECTED Final   Parainfluenza Virus 4 DETECTED (A) NOT DETECTED Final   Respiratory Syncytial Virus NOT DETECTED NOT DETECTED Final   Bordetella pertussis NOT DETECTED NOT DETECTED Final   Bordetella Parapertussis NOT DETECTED NOT DETECTED Final   Chlamydophila pneumoniae NOT DETECTED NOT DETECTED Final   Mycoplasma pneumoniae NOT DETECTED NOT DETECTED Final    Comment: Performed at [facility], [address], [place], [place] [address]  SARS Coronavirus 2 by RT PCR (hospital order, performed in [facility] hospital lab)  *cepheid single result test* Anterior Nasal Swab     Status: None   Collection Time: 01/14/23  9:50 AM   Specimen: Anterior Nasal Swab  Result Value Ref Range Status   SARS Coronavirus 2 by RT PCR NEGATIVE NEGATIVE Final    Comment: (NOTE) SARS-CoV-2 target nucleic acids are NOT DETECTED.  The SARS-CoV-2 RNA is generally detectable in upper and lower respiratory specimens during the acute phase of infection. The lowest concentration of SARS-CoV-2 viral copies this assay can detect is 250 copies / mL. A negative result does not preclude SARS-CoV-2 infection and should not be used as the sole basis for treatment or other patient management decisions.  A negative result may occur with improper specimen collection / handling, submission of specimen other than nasopharyngeal swab, presence of viral mutation(s) within the areas targeted by this assay, and inadequate number of viral copies (<250 copies / mL). A negative result must be combined with clinical observations, patient history, and epidemiological information.  Fact Sheet for Patients:   RoadLapTop.co.za  Fact Sheet for Healthcare Providers: http://kim-miller.com/  This test is not yet approved or  cleared by the [place] FDA and has been authorized for detection and/or diagnosis of SARS-CoV-2 by FDA under an Emergency Use Authorization (EUA).  This EUA will remain in effect (meaning this test can be used) for the duration of the COVID-19 declaration under Section 564(b)(1) of the Act, 21 U.S.C. section 360bbb-3(b)(1), unless the authorization is terminated or revoked sooner.  Performed at [facility], [address], [place], [place] [address]     Labs: CBC: Recent Labs  Lab 01/15/23 0429 01/16/23 0422 01/19/23 0446  WBC 7.0 7.4 12.5*  HGB 11.1* 11.1* 11.8*  HCT 36.4 34.7* 38.3  MCV 90.1 89.4 88.7  PLT 230 235 297   Basic Metabolic Panel: Recent Labs  Lab  01/15/23 0429 01/16/23 0422 01/19/23 0446  NA 138 139 137  K 4.2 4.1 4.8  CL 99 99 100  CO2 30 30 28   GLUCOSE 96 91 140*  BUN 29* 27* 29*  CREATININE 0.76 0.83 0.99  CALCIUM 9.3 9.2 9.0  MG  --  2.5* 2.6*   CBG: Recent Labs  Lab 01/20/23 1113 01/20/23 1618 01/20/23 2020 01/21/23 0721 01/21/23 1054  GLUCAP 101* 117* 117* 120* 126*    Discharge time spent: greater than 30 minutes.  Signed: Vassie Loll, MD [facility] 01/21/2023

## 2023-01-21 NOTE — Progress Notes (Signed)
Patient has rested well and states she was able to cough up a few secretions.  Patient still does have periods of shortness of breathe with exertion but during this shift, oxygen was not worn by patient.  Patient was weaned to room air by respiratory at approximately 2000 and was left nearby incase patient felt she needed it. Oxygen saturation was 94%on room air this am.

## 2023-01-21 NOTE — Progress Notes (Addendum)
PT Discharge Note  Patient Details Name: REMIAH Dean MRN: [id number] DOB: [birth year]   Cancelled Treatment:    Reason Eval/Treat Not Completed: Other (comment) (Pt ready to be discharged from Acute PT services. Expected DC home.)  Discussed with pt today regarding current functional. Pt reports she knows she has weakness but this does prevent her from safely and independently caring for self and with daughter assist at discharge. Per pt, she talked with MD and would like to discharge this afternoon.   Per pt she has been mobilizing independently in the room, utilizing the mobility tech as able and has drastically improved since PT initial evaluation. Pt reports working details out with daughter who will assist with care after DC, will be staying in handicap accommodating home with ramp, and DME as needed upon DC.   Based on current findings and progress, recommending DC home with [facility] PT services and PT will discharge pt from acute skilled PT services as pt has met all acute skilled PT goals and pt is in agreement with no more needed acute PT rehab.   01/21/23 1115  PT Recommendation  Follow Up Recommendations Home health PT    PT to sign off on pt, she is safe to continue mobility with RN and mobility tech as appropriate.   Nelida Meuse PT, DPT [facility] [phone] office Nelida Meuse 01/21/2023, 11:16 AM

## 2023-01-21 NOTE — Care Management Important Message (Signed)
Important Message  Patient Details  Name: Victoria Dean MRN: [id number] Date of Birth: 27-May-1950   Important Message Given:  Yes - Medicare IM     Corey Harold 01/21/2023, 1:17 PM

## 2023-01-21 NOTE — Progress Notes (Signed)
Patient and family state understanding of discharge instructions 

## 2023-01-22 ENCOUNTER — Encounter: Payer: Self-pay | Admitting: *Deleted

## 2023-01-22 ENCOUNTER — Other Ambulatory Visit: Payer: Self-pay | Admitting: *Deleted

## 2023-01-22 DIAGNOSIS — C50412 Malignant neoplasm of upper-outer quadrant of left female breast: Secondary | ICD-10-CM

## 2023-02-25 ENCOUNTER — Ambulatory Visit: Payer: Medicare PPO | Admitting: Oncology

## 2023-02-25 ENCOUNTER — Other Ambulatory Visit: Payer: Medicare PPO

## 2023-02-25 ENCOUNTER — Ambulatory Visit: Payer: Medicare PPO

## 2023-02-27 ENCOUNTER — Encounter: Payer: Self-pay | Admitting: Oncology

## 2023-03-04 ENCOUNTER — Other Ambulatory Visit (HOSPITAL_COMMUNITY): Payer: Self-pay | Admitting: Family Medicine

## 2023-03-04 ENCOUNTER — Ambulatory Visit (HOSPITAL_COMMUNITY)
Admission: RE | Admit: 2023-03-04 | Discharge: 2023-03-04 | Disposition: A | Discharge status: Home / Self Care | Payer: Medicare PPO | Source: Ambulatory Visit | Attending: Family Medicine | Admitting: Family Medicine

## 2023-03-04 DIAGNOSIS — J189 Pneumonia, unspecified organism: Secondary | ICD-10-CM | POA: Diagnosis present

## 2023-03-09 ENCOUNTER — Inpatient Hospital Stay: Payer: Medicare PPO

## 2023-03-09 ENCOUNTER — Encounter: Payer: Self-pay | Admitting: Oncology

## 2023-03-09 ENCOUNTER — Inpatient Hospital Stay: Payer: Medicare PPO | Admitting: Oncology

## 2023-03-09 ENCOUNTER — Ambulatory Visit
Admission: RE | Admit: 2023-03-09 | Discharge: 2023-03-09 | Disposition: A | Discharge status: Home / Self Care | Payer: Medicare PPO | Source: Ambulatory Visit | Attending: Radiation Oncology | Admitting: Radiation Oncology

## 2023-03-09 VITALS — BP 126/62 | HR 94 | Temp 97.4°F | Resp 18 | Wt 242.6 lb

## 2023-03-09 DIAGNOSIS — Z923 Personal history of irradiation: Secondary | ICD-10-CM | POA: Insufficient documentation

## 2023-03-09 DIAGNOSIS — Z7983 Long term (current) use of bisphosphonates: Secondary | ICD-10-CM

## 2023-03-09 DIAGNOSIS — Z170 Estrogen receptor positive status [ER+]: Secondary | ICD-10-CM

## 2023-03-09 DIAGNOSIS — Z1721 Progesterone receptor positive status: Secondary | ICD-10-CM | POA: Diagnosis not present

## 2023-03-09 DIAGNOSIS — Z79899 Other long term (current) drug therapy: Secondary | ICD-10-CM | POA: Insufficient documentation

## 2023-03-09 DIAGNOSIS — C50412 Malignant neoplasm of upper-outer quadrant of left female breast: Secondary | ICD-10-CM | POA: Insufficient documentation

## 2023-03-09 DIAGNOSIS — Z79811 Long term (current) use of aromatase inhibitors: Secondary | ICD-10-CM

## 2023-03-09 DIAGNOSIS — M8580 Other specified disorders of bone density and structure, unspecified site: Secondary | ICD-10-CM | POA: Insufficient documentation

## 2023-03-09 DIAGNOSIS — Z5181 Encounter for therapeutic drug level monitoring: Secondary | ICD-10-CM

## 2023-03-09 DIAGNOSIS — Z1732 Human epidermal growth factor receptor 2 negative status: Secondary | ICD-10-CM | POA: Diagnosis not present

## 2023-03-09 DIAGNOSIS — Z08 Encounter for follow-up examination after completed treatment for malignant neoplasm: Secondary | ICD-10-CM

## 2023-03-09 DIAGNOSIS — E538 Deficiency of other specified B group vitamins: Secondary | ICD-10-CM

## 2023-03-09 LAB — COMPREHENSIVE METABOLIC PANEL
ALT: 25 U/L (ref 0–44)
AST: 23 U/L (ref 15–41)
Albumin: 3.8 g/dL (ref 3.5–5.0)
Alkaline Phosphatase: 103 U/L (ref 38–126)
Anion gap: 7 (ref 5–15)
BUN: 22 mg/dL (ref 8–23)
CO2: 24 mmol/L (ref 22–32)
Calcium: 8.9 mg/dL (ref 8.9–10.3)
Chloride: 106 mmol/L (ref 98–111)
Creatinine, Ser: 0.88 mg/dL (ref 0.44–1.00)
GFR, Estimated: >60 mL/min (ref >60)
Glucose, Bld: 126 mg/dL — ABNORMAL HIGH (ref 70–99)
Potassium: 3.4 mmol/L — ABNORMAL LOW (ref 3.5–5.1)
Sodium: 137 mmol/L (ref 135–145)
Total Bilirubin: 0.5 mg/dL (ref 0.0–1.2)
Total Protein: 6.8 g/dL (ref 6.5–8.1)

## 2023-03-09 MED ORDER — ZOLEDRONIC ACID 4 MG/100ML IV SOLN
4.0000 mg | Freq: Once | INTRAVENOUS | Status: AC
  Administered 2023-03-09: 4 mg via INTRAVENOUS
  Filled 2023-03-09: qty 100

## 2023-03-09 MED ORDER — SODIUM CHLORIDE 0.9 % IV SOLN
INTRAVENOUS | Status: DC
  Filled 2023-03-09: qty 250

## 2023-03-09 NOTE — Progress Notes (Signed)
Radiation Oncology Follow up Note  Name: Victoria Dean   Date:   03/09/2023 MRN:  [id number] DOB: Sep 28, 1950    This 73 y.o. 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  REFERRING PROVIDER: Oneal Grout, FNP  HPI: Patient is a 73 year old female now out over 2 years having completed whole breast and peripheral lymphatic radiation therapy to her left breast for stage IIIa ER positive invasive lobular carcinoma seen today in routine follow-up she is doing well.  She specifically denies breast tenderness cough or bone pain..  She is currently on Femara tolerating it well without side effect.  Her last mammograms were back in May which I have reviewed showed BI-RADS 2 benign findings.  She also had a CT scan of her chest back in December which I reviewed showing bronchitis with patchy areas of bronchiolitis bilaterally in both lungs.  No evidence of a PE.  COMPLICATIONS OF TREATMENT: none  FOLLOW UP COMPLIANCE: keeps appointments   PHYSICAL EXAM:  There were no vitals taken for this visit. Left breast is somewhat contracted in size from prior surgery.  No dominant masses noted in either breast no axillary or supraclavicular adenopathy is appreciated.  Well-developed well-nourished patient in NAD. HEENT reveals PERLA, EOMI, discs not visualized.  Oral cavity is clear. No oral mucosal lesions are identified. Neck is clear without evidence of cervical or supraclavicular adenopathy. Lungs are clear to A&P. Cardiac examination is essentially unremarkable with regular rate and rhythm without murmur rub or thrill. Abdomen is benign with no organomegaly or masses noted. Motor sensory and DTR levels are equal and symmetric in the upper and lower extremities. Cranial nerves II through XII are grossly intact. Proprioception is intact. No peripheral adenopathy or edema is  identified. No motor or sensory levels are noted. Crude visual fields are within normal range.  RADIOLOGY RESULTS: Mammogram and CT scan reviewed compatible with above-stated findings  PLAN: Present time patient is doing well with no evidence of disease now out over 2 years.  I am going to turn follow-up care over to medical oncology who is seeing her twice a year.  I would be happy to reevaluate the patient in time should that be indicated.  I would like to take this opportunity to thank you for allowing me to participate in the care of your patient.Carmina Miller, MD

## 2023-03-09 NOTE — Progress Notes (Signed)
Hematology/Oncology Consult note [facility]  [phone] [phone]  Patient Care Team: Oneal Grout, FNP as PCP - General (Family Medicine) Irene Limbo., MD as Consulting Physician (Ophthalmology) Dimmig, Maisie Fus, MD as Referring Physician (Orthopedic Surgery) Debbrah Alar, MD (Dermatology) Dasher, Cliffton Asters, MD (Dermatology) Creig Hines, MD as Consulting Physician (Hematology and Oncology) Carmina Miller, MD as Consulting Physician (Radiation Oncology) Lemar Livings Merrily Pew, MD as Consulting Physician (General Surgery)   Name of the patient: Victoria Dean  [id number]  26-Oct-1950   Date of visit: 03/09/23  Diagnosis-  pathological prognostic stage Ib invasive lobular carcinoma of the left breast [id number] ER/PR positive HER2 negative   Chief complaint/ Reason for visit-routine follow-up of breast cancer on letrozole  Heme/Onc history: Patient is a 73 year old female with a past medical history significant for claustrophobia, hypertension hypercholesterolemia, depression among other medical problems.  Patient began to notice retraction of her left nipple.  This was followed by a diagnostic mammogram which showed vague shadowing in the upper outer quadrant of the left breast without a definitive mass.  Benign cysts in the lower left breast measuring 1.5 cm.  No evidence of malignancy in the right breast.  No evidence of abnormal left axillary lymph nodes.  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   Bilateral breast MRI showed 7 x 6 x 4 cm area of non-mass enhancement in the upper outer left breast at 1 o'clock position.  This involves anterior and middle third of the breast and extension into the posterior third.  2 inferior left axillary lymph nodes  with mild cortical thickening.   PET CT scan showed 1.7 cm hypermetabolic nodule in the midline of the neck which could represent thyroglossal cyst but malignancy cannot be excluded. Patient underwent left-sided lumpectomy with nipple excision.     Final pathology showed invasive lobular carcinoma grade 210.5 cm with associated DCIS.  Skin/nipple are present and involved.  Invasive carcinoma invades the nipple without skin ulceration.  Lymphovascular invasion present.  Margins negative.  3 out of 4 lymph nodes positive for malignancy 11 mm largest deposit with extranodal extension.  pT3 pN1 a cM0   Patient was taken back for axillary dissection given 3 lymph nodes positive with extranodal extension and 4 more lymph nodes out of 9 were positive with extranodal extension   Given cardiovascular comorbidities and patient age none anthracycline adjuvant chemotherapy options were discussed.  Patient did not wish to proceed with TC chemotherapy.  She completed 12 cycles of weekly Taxol chemotherapy in October 2022 followed by adjuvant radiation therapy.  Patient started taking letrozole sometime in February 2023.  She is presently getting adjuvant Zometa  Interval history-patient was admitted in the hospital in December 2024 for pneumonia and is gradually getting back to her baseline.  She denies any breast concerns at this time.  Tolerating letrozole well without any significant side effects  ECOG PS- 2 Pain scale- 0   Review of systems- Review of Systems  Constitutional:  Positive for malaise/fatigue. Negative for chills, fever and weight loss.  HENT:  Negative for congestion, ear discharge and nosebleeds.   Eyes:  Negative for blurred vision.  Respiratory:  Negative for cough, hemoptysis, sputum production, shortness of breath and wheezing.   Cardiovascular:  Negative for chest pain, palpitations, orthopnea  and claudication.  Gastrointestinal:  Negative for abdominal pain, blood in stool,  constipation, diarrhea, heartburn, melena, nausea and vomiting.  Genitourinary:  Negative for dysuria, flank pain, frequency, hematuria and urgency.  Musculoskeletal:  Positive for joint pain. Negative for back pain and myalgias.  Skin:  Negative for rash.  Neurological:  Negative for dizziness, tingling, focal weakness, seizures, weakness and headaches.  Endo/Heme/Allergies:  Does not bruise/bleed easily.  Psychiatric/Behavioral:  Negative for depression and suicidal ideas. The patient does not have insomnia.       Allergies  Allergen Reactions   Other Itching and Other (See Comments)    Brand name bandaids with cloth backing causes redness and itching   Statins     Muscle weakness   Stiolto Respimat [Tiotropium Bromide-Olodaterol]    Zetia [Ezetimibe]     Leg pain, upset stomach   Sulfa Antibiotics Rash     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    Vitamin D deficiency      Past Surgical History:  Procedure Laterality Date   ABDOMINAL HYSTERECTOMY  1995   APPENDECTOMY  1994   BREAST BIOPSY Right 1998   neg   BREAST BIOPSY Left 05/30/2020   stere bx x clip path pending   BREAST LUMPECTOMY WITH SENTINEL LYMPH NODE BIOPSY Left 06/29/2020   Procedure: BREAST LUMPECTOMY WITH SENTINEL LYMPH NODE BX;  Surgeon: Earline Mayotte, MD;  Location: [facility];  Service: General;  Laterality: Left;   CATARACT EXTRACTION W/PHACO Left 01/31/2022   Procedure: CATARACT EXTRACTION PHACO AND INTRAOCULAR LENS PLACEMENT  (IOC);  Surgeon: Fabio Pierce, MD;  Location: [facility];  Service: Ophthalmology;  Laterality: Left;  CDE 8.10   CATARACT EXTRACTION W/PHACO Right 02/21/2022   Procedure: CATARACT EXTRACTION PHACO AND INTRAOCULAR LENS PLACEMENT (IOC);  Surgeon: Fabio Pierce, MD;  Location: [facility];  Service: Ophthalmology;  Laterality: Right;  CDE: 9.42   ESI     LUMBAR MICRODISCECTOMY  2019   L3-4   LUMBAR MICRODISCECTOMY  2008   L4-L5 repeated   MICRODISCECTOMY LUMBAR  2008   L4-5   MICRODISCECTOMY LUMBAR  2010   L4-L5 repeated   NODE DISSECTION Left 08/13/2020   Procedure: AXILLARY NODE DISSECTION;  Surgeon: Earline Mayotte, MD;  Location: [facility];  Service: General;  Laterality: Left;   PORTACATH PLACEMENT Right 09/05/2020   Procedure: INSERTION PORT-A-CATH;  Surgeon: Earline Mayotte, MD;  Location: [facility];  Service: General;  Laterality: Right;  right; monitor anesthesia care   SQUAMOUS CELL CARCINOMA EXCISION     TONSILLECTOMY  1971    Social History   Socioeconomic History   Marital status: Widowed    Spouse name: Not on file   Number of children: 1   Years of education: Not on file   Highest education level: Bachelor's degree (e.g., BA, AB, BS)  Occupational History   Occupation: retired  Tobacco Use   Smoking status: Former    Current packs/day: 0.00    Average packs/day: 0.9 packs/day for 33.0 years (30.0 ttl pk-yrs)    Types: Cigarettes    Start date: 04/18/1985    Quit date: 04/19/2018    Years since quitting: 4.8   Smokeless tobacco: Never  Vaping Use   Vaping status: Never Used  Substance and Sexual Activity   Alcohol use: Yes    Comment: rarely   Drug use: No   Sexual activity: Not Currently  Other Topics Concern   Not on file  Social History Narrative   Not on file   Social Drivers of Health   Financial Resource Strain: Low Risk  (10/23/2021)   Overall Financial Resource Strain (CARDIA)    Difficulty of Paying Living Expenses: Not hard at all  Food Insecurity:  No Food Insecurity (01/12/2023)   Hunger Vital Sign    Worried About Running Out of Food in the Last Year: Never true    Ran Out of Food in the Last Year: Never true  Transportation Needs: No Transportation Needs (01/12/2023)   PRAPARE - Administrator, Civil Service (Medical): No    Lack of Transportation (Non-Medical): No  Physical Activity: Inactive (02/16/2018)   Exercise Vital Sign    Days of Exercise per Week: 0 days    Minutes of Exercise per Session: 0 min  Stress: No Stress Concern Present (02/16/2018)   [employer] of Occupational Health - Occupational Stress Questionnaire    Feeling of Stress : Only a little  Social Connections: Moderately Isolated (10/23/2021)   Social Connection and Isolation Panel [NHANES]    Frequency of Communication with Friends and Family: More than three times a week    Frequency of Social Gatherings with Friends and Family: More than three times a week    Attends Religious Services: More than 4 times per year    Active Member of [employer] or Organizations: No    Attends Banker Meetings: Never    Marital Status: Widowed  Intimate Partner Violence: Not At Risk (01/12/2023)   Humiliation, Afraid, Rape, and Kick questionnaire    Fear of Current or Ex-Partner: No    Emotionally Abused: No    Physically Abused: No    Sexually Abused: No    Family History  Problem Relation Age of Onset   Breast cancer Mother 22   Osteosarcoma Mother        d. 73   Hypertension Brother    COPD Father    Hypertension Father    Breast cancer Paternal Aunt        dx 76s   Breast cancer Paternal Grandmother        dx 71s   Stomach cancer Paternal Grandfather        dx 32s   Breast cancer Paternal Aunt        dx 59s   Brain cancer Paternal Aunt        dx 59s     Current Outpatient Medications:    propranolol (INDERAL) 10 MG tablet, TAKE 1 OR 2 TABLETS BY MOUTH THREE TIMES DAILY AS NEEDED FOR ANXIETY, Disp: , Rfl:    acetaminophen  (TYLENOL) 650 MG CR tablet, Take 1,300 mg by mouth every 8 (eight) hours as needed for pain., Disp: , Rfl:    albuterol (VENTOLIN HFA) 108 (90 Base) MCG/ACT inhaler, Inhale 2 puffs into the lungs every 6 (six) hours as needed for wheezing or  shortness of breath., Disp: , Rfl:    amLODipine (NORVASC) 5 MG tablet, Take by mouth., Disp: , Rfl:    aspirin EC 81 MG tablet, Take 1 tablet (81 mg total) by mouth daily. Swallow whole., Disp: 150 tablet, Rfl: 2   B-D 3CC LUER-LOK SYR 25GX1" 25G X 1" 3 ML MISC, USE 1 SYRINGE EVERY 30 DAYS, Disp: 3 each, Rfl: 3   Biotin 1000 MCG tablet, Take 1,000 mcg by mouth daily., Disp: , Rfl:    cholecalciferol (VITAMIN D) 25 MCG (1000 UNIT) tablet, Take 1,000 Units by mouth daily., Disp: , Rfl:    clobetasol (TEMOVATE) 0.05 % external solution, Apply topically daily as needed., Disp: , Rfl:    cyanocobalamin (VITAMIN B12) 1000 MCG/ML injection, INJECT 1 ML (1,000 MCG TOTAL) INTO THE MUSCLE EVERY 30 (THIRTY) DAYS., Disp: 3 mL, Rfl: 3   famotidine (PEPCID) 10 MG tablet, Take by mouth. Take 10 mg by mouth 2 (two) times daily as needed for Heartburn, Disp: , Rfl:    guaiFENesin-dextromethorphan (ROBITUSSIN DM) 100-10 MG/5ML syrup, Take 10 mLs by mouth every 8 (eight) hours as needed for cough., Disp: 236 mL, Rfl: 0   hydrocortisone 2.5 % lotion, Apply 1 application topically daily as needed (when washing face)., Disp: , Rfl:    letrozole (FEMARA) 2.5 MG tablet, TAKE 1 TABLET EVERY DAY, Disp: 90 tablet, Rfl: 3   losartan (COZAAR) 100 MG tablet, TAKE 1 TABLET EVERY DAY, Disp: 90 tablet, Rfl: 1   Menthol, Topical Analgesic, (BIOFREEZE EX), Apply 1 application topically daily as needed (back pain)., Disp: , Rfl:    mometasone (ELOCON) 0.1 % cream, Apply 1 application topically daily as needed (ear irritation)., Disp: , Rfl:    pantoprazole (PROTONIX) 40 MG tablet, Take 1 tablet (40 mg total) by mouth daily., Disp: 30 tablet, Rfl: 1   predniSONE (DELTASONE) 20 MG tablet, Take 3  tablets by mouth daily x 1 day; then 2 tablets by mouth daily x 2 days; then 1 tablet by mouth daily x 3 days; then half tablet by mouth daily x 3 days and stop prednisone., Disp: 12 tablet, Rfl: 0   Propylene Glycol (SYSTANE COMPLETE) 0.6 % SOLN, Apply to eye., Disp: , Rfl:    rosuvastatin (CRESTOR) 5 MG tablet, Take 1 tablet (5 mg total) by mouth daily., Disp: 90 tablet, Rfl: 3   Semaglutide,0.25 or 0.5MG /DOS, (OZEMPIC, 0.25 OR 0.5 MG/DOSE,) 2 MG/1.5ML SOPN, Inject 0.25 mg every week by subcutaneous route for 28 days., Disp: , Rfl:    venlafaxine XR (EFFEXOR-XR) 75 MG 24 hr capsule, TAKE 3 CAPSULES (225 MG TOTAL) BY MOUTH DAILY WITH BREAKFAST. (Patient taking differently: Take 150 mg by mouth daily with breakfast. 2 tablets at bedtime), Disp: 270 capsule, Rfl: 1   zoledronic acid (ZOMETA) 4 MG/5ML injection, Inject 4 mg into the vein once., Disp: , Rfl:  No current facility-administered medications for this visit.  Facility-Administered Medications Ordered in Other Visits:    0.9 %  sodium chloride infusion, , Intravenous, Continuous, Creig Hines, MD, Stopped at 03/09/23 1111  Physical exam:  Vitals:   03/09/23 0932  BP: 126/62  Pulse: 94  Resp: 18  Temp: (!) 97.4 F (36.3 C)  TempSrc: Tympanic  SpO2: 96%  Weight: 242 lb 9.6 oz (110 kg)   Physical Exam Constitutional:      Comments: Ambulates with a cane.  Appears in no acute distress  Cardiovascular:     Rate and Rhythm: Normal rate and regular rhythm.  Heart sounds: Normal heart sounds.  Pulmonary:     Effort: Pulmonary effort is normal.     Breath sounds: Normal breath sounds.  Abdominal:     General: Bowel sounds are normal.     Palpations: Abdomen is soft.  Skin:    General: Skin is warm and dry.  Neurological:     Mental Status: She is alert and oriented to person, place, and time.   Breast exam: Breast exam was performed in seated and lying down position. Patient is status post left lumpectomy with a  well-healed surgical scar. No evidence of any palpable masses.  Left nipple is surgically absent no evidence of axillary adenopathy. No evidence of any palpable masses or lumps in the right breast. No evidence of right axillary adenopathy      Latest Ref Rng & Units 03/09/2023    9:01 AM  CMP  Glucose 70 - 99 mg/dL 284   BUN 8 - 23 mg/dL 22   Creatinine 1.32 - 1.00 mg/dL 4.40   Sodium 102 - 725 mmol/L 137   Potassium 3.5 - 5.1 mmol/L 3.4   Chloride 98 - 111 mmol/L 106   CO2 22 - 32 mmol/L 24   Calcium 8.9 - 10.3 mg/dL 8.9   Total Protein 6.5 - 8.1 g/dL 6.8   Total Bilirubin 0.0 - 1.2 mg/dL 0.5   Alkaline Phos 38 - 126 U/L 103   AST 15 - 41 U/L 23   ALT 0 - 44 U/L 25       Latest Ref Rng & Units 01/19/2023    4:46 AM  CBC  WBC 4.0 - 10.5 K/uL 12.5   Hemoglobin 12.0 - 15.0 g/dL 36.6   Hematocrit 44.0 - 46.0 % 38.3   Platelets 150 - 400 K/uL 297      Assessment and plan- Patient is a 73 y.o. 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  She is s/p 12 cycles of weekly Taxol chemotherapy and adjuvant radiation treatment.  She is here for routine follow-up of breast cancer and to receive Zometa  Patient will be receiving Zometa today and this will be her last dose.  Clinically she is doing well without any signs and symptoms of recurrence based on today's exam.  She will continue taking letrozole along with calcium and vitamin D.  I will see her back in 6 months with a bone density scan prior.  Calcium levels acceptable to proceed with Zometa today.  I will also schedule her mammogram from May 2025   Visit Diagnosis 1. Encounter for follow-up surveillance of breast cancer   2. Use of letrozole (Femara)   3. Encounter for monitoring zoledronic acid therapy      Dr. Owens Shark, MD, MPH [facility] [phone] 03/09/2023 12:42 PM

## 2023-04-23 ENCOUNTER — Emergency Department (HOSPITAL_COMMUNITY)

## 2023-04-23 ENCOUNTER — Inpatient Hospital Stay (HOSPITAL_COMMUNITY)
Admission: EM | Admit: 2023-04-23 | Discharge: 2023-04-25 | DRG: 191 | Disposition: A | Discharge status: Home / Self Care | Attending: Family Medicine | Admitting: Family Medicine

## 2023-04-23 ENCOUNTER — Encounter (HOSPITAL_COMMUNITY): Payer: Self-pay

## 2023-04-23 ENCOUNTER — Other Ambulatory Visit: Payer: Self-pay

## 2023-04-23 DIAGNOSIS — Z800 Family history of malignant neoplasm of digestive organs: Secondary | ICD-10-CM

## 2023-04-23 DIAGNOSIS — J209 Acute bronchitis, unspecified: Secondary | ICD-10-CM | POA: Diagnosis not present

## 2023-04-23 DIAGNOSIS — Z803 Family history of malignant neoplasm of breast: Secondary | ICD-10-CM

## 2023-04-23 DIAGNOSIS — Z808 Family history of malignant neoplasm of other organs or systems: Secondary | ICD-10-CM

## 2023-04-23 DIAGNOSIS — Z6841 Body Mass Index (BMI) 40.0 and over, adult: Secondary | ICD-10-CM

## 2023-04-23 DIAGNOSIS — J441 Chronic obstructive pulmonary disease with (acute) exacerbation: Principal | ICD-10-CM | POA: Diagnosis present

## 2023-04-23 DIAGNOSIS — K219 Gastro-esophageal reflux disease without esophagitis: Secondary | ICD-10-CM | POA: Diagnosis present

## 2023-04-23 DIAGNOSIS — Z8249 Family history of ischemic heart disease and other diseases of the circulatory system: Secondary | ICD-10-CM

## 2023-04-23 DIAGNOSIS — M549 Dorsalgia, unspecified: Secondary | ICD-10-CM | POA: Insufficient documentation

## 2023-04-23 DIAGNOSIS — E782 Mixed hyperlipidemia: Secondary | ICD-10-CM | POA: Diagnosis present

## 2023-04-23 DIAGNOSIS — T380X5A Adverse effect of glucocorticoids and synthetic analogues, initial encounter: Secondary | ICD-10-CM | POA: Diagnosis present

## 2023-04-23 DIAGNOSIS — Z87891 Personal history of nicotine dependence: Secondary | ICD-10-CM

## 2023-04-23 DIAGNOSIS — J470 Bronchiectasis with acute lower respiratory infection: Secondary | ICD-10-CM | POA: Diagnosis present

## 2023-04-23 DIAGNOSIS — Z853 Personal history of malignant neoplasm of breast: Secondary | ICD-10-CM

## 2023-04-23 DIAGNOSIS — Z9109 Other allergy status, other than to drugs and biological substances: Secondary | ICD-10-CM

## 2023-04-23 DIAGNOSIS — Z79899 Other long term (current) drug therapy: Secondary | ICD-10-CM

## 2023-04-23 DIAGNOSIS — Z7952 Long term (current) use of systemic steroids: Secondary | ICD-10-CM

## 2023-04-23 DIAGNOSIS — G8929 Other chronic pain: Secondary | ICD-10-CM | POA: Diagnosis present

## 2023-04-23 DIAGNOSIS — Z882 Allergy status to sulfonamides status: Secondary | ICD-10-CM

## 2023-04-23 DIAGNOSIS — E66813 Obesity, class 3: Secondary | ICD-10-CM | POA: Diagnosis present

## 2023-04-23 DIAGNOSIS — J440 Chronic obstructive pulmonary disease with acute lower respiratory infection: Secondary | ICD-10-CM | POA: Diagnosis present

## 2023-04-23 DIAGNOSIS — J9811 Atelectasis: Secondary | ICD-10-CM | POA: Diagnosis not present

## 2023-04-23 DIAGNOSIS — Z825 Family history of asthma and other chronic lower respiratory diseases: Secondary | ICD-10-CM

## 2023-04-23 DIAGNOSIS — J439 Emphysema, unspecified: Secondary | ICD-10-CM | POA: Diagnosis present

## 2023-04-23 DIAGNOSIS — Z79811 Long term (current) use of aromatase inhibitors: Secondary | ICD-10-CM

## 2023-04-23 DIAGNOSIS — R601 Generalized edema: Secondary | ICD-10-CM | POA: Diagnosis present

## 2023-04-23 DIAGNOSIS — Z923 Personal history of irradiation: Secondary | ICD-10-CM

## 2023-04-23 DIAGNOSIS — I493 Ventricular premature depolarization: Secondary | ICD-10-CM | POA: Diagnosis present

## 2023-04-23 DIAGNOSIS — F329 Major depressive disorder, single episode, unspecified: Secondary | ICD-10-CM | POA: Diagnosis present

## 2023-04-23 DIAGNOSIS — I10 Essential (primary) hypertension: Secondary | ICD-10-CM | POA: Diagnosis present

## 2023-04-23 DIAGNOSIS — Z9221 Personal history of antineoplastic chemotherapy: Secondary | ICD-10-CM

## 2023-04-23 DIAGNOSIS — Z7982 Long term (current) use of aspirin: Secondary | ICD-10-CM

## 2023-04-23 DIAGNOSIS — Z888 Allergy status to other drugs, medicaments and biological substances status: Secondary | ICD-10-CM

## 2023-04-23 LAB — COMPREHENSIVE METABOLIC PANEL
ALT: 19 U/L (ref 0–44)
AST: 20 U/L (ref 15–41)
Albumin: 3.9 g/dL (ref 3.5–5.0)
Alkaline Phosphatase: 100 U/L (ref 38–126)
Anion gap: 13 (ref 5–15)
BUN: 19 mg/dL (ref 8–23)
CO2: 24 mmol/L (ref 22–32)
Calcium: 9.4 mg/dL (ref 8.9–10.3)
Chloride: 102 mmol/L (ref 98–111)
Creatinine, Ser: 0.89 mg/dL (ref 0.44–1.00)
GFR, Estimated: >60 mL/min (ref >60)
Glucose, Bld: 91 mg/dL (ref 70–99)
Potassium: 3.7 mmol/L (ref 3.5–5.1)
Sodium: 139 mmol/L (ref 135–145)
Total Bilirubin: 0.3 mg/dL (ref 0.0–1.2)
Total Protein: 7 g/dL (ref 6.5–8.1)

## 2023-04-23 LAB — BRAIN NATRIURETIC PEPTIDE: B Natriuretic Peptide: 42 pg/mL (ref 0.0–100.0)

## 2023-04-23 LAB — TROPONIN I (HIGH SENSITIVITY): Troponin I (High Sensitivity): 14 ng/L (ref <18)

## 2023-04-23 LAB — CBC WITH DIFFERENTIAL/PLATELET
Abs Immature Granulocytes: 0.02 10*3/uL (ref 0.00–0.07)
Basophils Absolute: 0.1 10*3/uL (ref 0.0–0.1)
Basophils Relative: 1 %
Eosinophils Absolute: 0.2 10*3/uL (ref 0.0–0.5)
Eosinophils Relative: 2 %
HCT: 36.2 % (ref 36.0–46.0)
Hemoglobin: 11.2 g/dL — ABNORMAL LOW (ref 12.0–15.0)
Immature Granulocytes: 0 %
Lymphocytes Relative: 20 %
Lymphs Abs: 1.5 10*3/uL (ref 0.7–4.0)
MCH: 27.8 pg (ref 26.0–34.0)
MCHC: 30.9 g/dL (ref 30.0–36.0)
MCV: 89.8 fL (ref 80.0–100.0)
Monocytes Absolute: 1 10*3/uL (ref 0.1–1.0)
Monocytes Relative: 14 %
Neutro Abs: 4.8 10*3/uL (ref 1.7–7.7)
Neutrophils Relative %: 63 %
Platelets: 269 10*3/uL (ref 150–400)
RBC: 4.03 MIL/uL (ref 3.87–5.11)
RDW: 16.2 % — ABNORMAL HIGH (ref 11.5–15.5)
WBC: 7.6 10*3/uL (ref 4.0–10.5)
nRBC: 0 % (ref 0.0–0.2)

## 2023-04-23 LAB — D-DIMER, QUANTITATIVE: D-Dimer, Quant: 0.34 ug{FEU}/mL (ref 0.00–0.50)

## 2023-04-23 MED ORDER — METHYLPREDNISOLONE SODIUM SUCC 125 MG IJ SOLR
125.0000 mg | Freq: Once | INTRAMUSCULAR | Status: AC
  Administered 2023-04-23: 125 mg via INTRAVENOUS
  Filled 2023-04-23: qty 2

## 2023-04-23 MED ORDER — IOHEXOL 350 MG/ML SOLN
75.0000 mL | Freq: Once | INTRAVENOUS | Status: AC | PRN
  Administered 2023-04-23: 75 mL via INTRAVENOUS

## 2023-04-23 MED ORDER — ALBUTEROL SULFATE (2.5 MG/3ML) 0.083% IN NEBU
10.0000 mg/h | INHALATION_SOLUTION | Freq: Once | RESPIRATORY_TRACT | Status: AC
  Administered 2023-04-23: 10 mg/h via RESPIRATORY_TRACT
  Filled 2023-04-23: qty 3

## 2023-04-23 MED ORDER — ALBUTEROL SULFATE HFA 108 (90 BASE) MCG/ACT IN AERS: 2.0000 | INHALATION_SPRAY | RESPIRATORY_TRACT | Status: DC | PRN

## 2023-04-23 MED ORDER — FUROSEMIDE 10 MG/ML IJ SOLN
20.0000 mg | Freq: Once | INTRAMUSCULAR | Status: AC
  Administered 2023-04-23: 20 mg via INTRAVENOUS
  Filled 2023-04-23: qty 2

## 2023-04-23 NOTE — ED Provider Notes (Signed)
 [place] EMERGENCY DEPARTMENT AT [facility] Provider Note   CSN: [id number] Arrival date & time: 04/23/23  9147     History  Chief Complaint  Patient presents with   Shortness of Breath    Victoria Dean is a 73 y.o. female.  Patient is a 73 year old female who presents to the emergency department the chief complaint of shortness of breath which has been ongoing for approximate the past 3 days and has been gradually worsening.  Patient also notes that she has been experiencing swelling to bilateral lower extremities as well as feel as though her abdomen is swollen.  She notes that she has been previously diagnosed with COPD but denies any associated wheezing.  Patient denies any associated cough, congestion, rhinorrhea, sore throat.  She has had no associated nausea, vomiting, diarrhea.  She denies any known history of heart failure.   Shortness of Breath      Home Medications Prior to Admission medications   Medication Sig Start Date End Date Taking? Authorizing Provider  acetaminophen (TYLENOL) 650 MG CR tablet Take 1,300 mg by mouth every 8 (eight) hours as needed for pain.    [provider]  albuterol (PROVENTIL) (2.5 MG/3ML) 0.083% nebulizer solution Take 2.5 mg by nebulization 4 (four) times daily as needed for wheezing or shortness of breath.    [provider]  albuterol (VENTOLIN HFA) 108 (90 Base) MCG/ACT inhaler Inhale 2 puffs into the lungs every 6 (six) hours as needed for wheezing or shortness of breath.    [provider]  amLODipine (NORVASC) 5 MG tablet Take by mouth. 10/09/22   [provider]  aspirin EC 81 MG tablet Take 1 tablet (81 mg total) by mouth daily. Swallow whole. 06/29/22 06/29/23  GhimireWerner Lean, MD  B-D 3CC LUER-LOK SYR 25GX1" 25G X 1" 3 ML MISC USE 1 SYRINGE EVERY 30 DAYS 01/05/23   Creig Hines, MD  Biotin 1000 MCG tablet Take 1,000 mcg by mouth daily.    [provider]  cholecalciferol  (VITAMIN D) 25 MCG (1000 UNIT) tablet Take 1,000 Units by mouth daily.    [provider]  clobetasol (TEMOVATE) 0.05 % external solution Apply topically daily as needed. 12/11/22   [provider]  cyanocobalamin (VITAMIN B12) 1000 MCG/ML injection INJECT 1 ML (1,000 MCG TOTAL) INTO THE MUSCLE EVERY 30 (THIRTY) DAYS. 01/05/23   Creig Hines, MD  famotidine (PEPCID) 10 MG tablet Take by mouth. Take 10 mg by mouth 2 (two) times daily as needed for Heartburn    [provider]  fluticasone-salmeterol (ADVAIR) 100-50 MCG/ACT AEPB Inhale 1 puff into the lungs 2 (two) times daily. 03/17/23   [provider]  guaiFENesin-dextromethorphan (ROBITUSSIN DM) 100-10 MG/5ML syrup Take 10 mLs by mouth every 8 (eight) hours as needed for cough. 01/21/23   Vassie Loll, MD  hydrocortisone 2.5 % lotion Apply 1 application topically daily as needed (when washing face).    [provider]  letrozole [facility]) 2.5 MG tablet TAKE 1 TABLET EVERY DAY 01/05/23   Creig Hines, MD  losartan (COZAAR) 100 MG tablet TAKE 1 TABLET EVERY DAY 05/10/21   Bosie Clos, MD  Menthol, Topical Analgesic, (BIOFREEZE EX) Apply 1 application topically daily as needed (back pain).    [provider]  mometasone (ELOCON) 0.1 % cream Apply 1 application topically daily as needed (ear irritation).    [provider]  pantoprazole (PROTONIX) 40 MG tablet Take 1 tablet (  40 mg total) by mouth daily. 01/21/23 01/21/24  Vassie Loll, MD  predniSONE (STERAPRED UNI-PAK 21 TAB) 10 MG (21) TBPK tablet Take 10 mg by mouth as directed. Dose pack 03/20/23   [provider]  propranolol (INDERAL) 10 MG tablet TAKE 1 OR 2 TABLETS BY MOUTH THREE TIMES DAILY AS NEEDED FOR ANXIETY 02/05/23   [provider]  Propylene Glycol (SYSTANE COMPLETE) 0.6 % SOLN Apply to eye.    [provider]  rosuvastatin (CRESTOR) 5 MG tablet Take 1 tablet (5 mg total) by mouth daily.  05/22/21   Bosie Clos, MD  Semaglutide,0.25 or 0.5MG /DOS, (OZEMPIC, 0.25 OR 0.5 MG/DOSE,) 2 MG/1.5ML SOPN Inject 0.25 mg every week by subcutaneous route for 28 days. 07/03/22   [provider]  venlafaxine XR (EFFEXOR-XR) 75 MG 24 hr capsule TAKE 3 CAPSULES (225 MG TOTAL) BY MOUTH DAILY WITH BREAKFAST. Patient taking differently: Take 150 mg by mouth daily with breakfast. 2 tablets at bedtime 01/10/21   Bosie Clos, MD  zoledronic acid (ZOMETA) 4 MG/5ML injection Inject 4 mg into the vein once.    [provider]      Allergies    Other, Statins, Stiolto respimat [tiotropium bromide-olodaterol], Zetia [ezetimibe], and Sulfa antibiotics    Review of Systems   Review of Systems  Respiratory:  Positive for shortness of breath.   Cardiovascular:  Positive for leg swelling.  All other systems reviewed and are negative.   Physical Exam Updated Vital Signs BP 134/68   Pulse 89   Temp 97.9 F (36.6 C) (Oral)   Resp (!) 24   Ht 5\' 3"  (1.6 m)   Wt 110 kg   SpO2 98%   BMI 42.96 kg/m  Physical Exam Constitutional:      Appearance: Normal appearance.  HENT:     Head: Normocephalic and atraumatic.     Nose: Nose normal.     Mouth/Throat:     Mouth: Mucous membranes are moist.  Eyes:     Extraocular Movements: Extraocular movements intact.     Conjunctiva/sclera: Conjunctivae normal.     Pupils: Pupils are equal, round, and reactive to light.  Cardiovascular:     Rate and Rhythm: Normal rate and regular rhythm.     Pulses: Normal pulses.     Heart sounds: Normal heart sounds.  Pulmonary:     Effort: Pulmonary effort is normal.     Breath sounds: Examination of the right-lower field reveals rales. Examination of the left-lower field reveals rales. Rales present. No decreased breath sounds, wheezing or rhonchi.  Chest:     Chest wall: No tenderness.  Abdominal:     General: Abdomen is flat. Bowel sounds are normal.     Palpations: Abdomen is soft.  There is no mass.     Tenderness: There is no abdominal tenderness. There is no guarding.  Musculoskeletal:        General: Normal range of motion.     Cervical back: Normal range of motion and neck supple.     Right lower leg: Edema present.     Left lower leg: Edema present.  Skin:    General: Skin is warm and dry.     Findings: No erythema or rash.  Neurological:     General: No focal deficit present.     Mental Status: She is alert and oriented to person, place, and time. Mental status is at baseline.  Psychiatric:        Mood and  Affect: Mood normal.        Behavior: Behavior normal.        Thought Content: Thought content normal.        Judgment: Judgment normal.     ED Results / Procedures / Treatments   Labs (all labs ordered are listed, but only abnormal results are displayed) Labs Reviewed  CBC WITH DIFFERENTIAL/PLATELET - Abnormal; Notable for the following components:      Result Value   Hemoglobin 11.2 (*)    RDW 16.2 (*)    All other components within normal limits  COMPREHENSIVE METABOLIC PANEL  D-DIMER, QUANTITATIVE  BRAIN NATRIURETIC PEPTIDE  TROPONIN I (HIGH SENSITIVITY)    EKG None  Radiology DG Chest 2 View Result Date: 04/23/2023 CLINICAL DATA:  Short of breath, difficulty breathing EXAM: CHEST - 2 VIEW COMPARISON:  03/04/2023 FINDINGS: Frontal and lateral views of the chest demonstrate an unremarkable cardiac silhouette. No acute airspace disease, effusion, or pneumothorax. No acute bony abnormalities. IMPRESSION: 1. No acute intrathoracic process. Electronically Signed   By: Sharlet Salina M.D.   On: 04/23/2023 20:01    Procedures Procedures    Medications Ordered in ED Medications  albuterol (VENTOLIN HFA) 108 (90 Base) MCG/ACT inhaler 2 puff (has no administration in time range)  furosemide (LASIX) injection 20 mg (has no administration in time range)    ED Course/ Medical Decision Making/ A&P                                 Medical  Decision Making Amount and/or Complexity of Data Reviewed Labs: ordered. Radiology: ordered.  Risk Prescription drug management. Decision regarding hospitalization.   This patient presents to the ED for concern of shortness of breath, edema, this involves an extensive number of treatment options, and is a complaint that carries with it a high risk of complications and morbidity.  The differential diagnosis includes COPD exacerbation, acute CHF, ACS, pulmonary embolus, pneumonia, pneumothorax, hemothorax, pericarditis, myocarditis, cirrhosis   Co morbidities that complicate the patient evaluation  COPD   Additional history obtained:  Additional history obtained from family External records from outside source obtained and reviewed including none   Lab Tests:  I Ordered, and personally interpreted labs.  The pertinent results include: No leukocytosis, mild anemia, normal liver function, kidney function, electrolytes, negative D-dimer, normal troponin, normal BNP   Imaging Studies ordered:  I ordered imaging studies including chest x-ray, CTA of the chest I independently visualized and interpreted imaging which showed no acute cardiopulmonary process, no pulmonary embolus I agree with the radiologist interpretation   Cardiac Monitoring: / EKG:  The patient was maintained on a cardiac monitor.  I personally viewed and interpreted the cardiac monitored which showed an underlying rhythm of: Normal sinus rhythm, no ST/T wave changes, no ischemic changes, no STEMI   Consultations Obtained:  I requested consultation with the specialist,  and discussed lab and imaging findings as well as pertinent plan - they recommend: Admission   Problem List / ED Course / Critical interventions / Medication management  Patient is doing well at this time and does remain stable.  Discussed with patient we will plan for admission to the hospital service for COPD exacerbation and clinical fluid  overload.  Patient was given Solu-Medrol, albuterol and Lasix in the emergency department.  Patient does have stable vital signs at this point and is not requiring any oxygen.  Do suspect that admission  would be warranted at this point given her recent worsening weight gain, increased edema and shortness of breath.  Did discuss patient case with Dr. Thomes Dinning with the hospitalist service who has excepted for admission.  CTA of the chest demonstrated no indication for pulmonary embolus.  She has no indication for pneumonia at this point.  Do not suspect ACS at this point as EKG demonstrates no acute ischemic changes and patient has a negative troponin. I ordered medication including Lasix, albuterol, Solu-Medrol for COPD exacerbation, anasarca Reevaluation of the patient after these medicines showed that the patient improved I have reviewed the patients home medicines and have made adjustments as needed   Social Determinants of Health:  None   Test / Admission - Considered:  Admission        Final Clinical Impression(s) / ED Diagnoses Final diagnoses:  None    Rx / DC Orders ED Discharge Orders     None         Kathlen Mody 04/23/23 2310    Bethann Berkshire, MD 04/24/23 1240

## 2023-04-23 NOTE — H&P (Incomplete)
 History and Physical    Patient: Victoria Dean:[id number] DOB: 1950-05-08 DOA: 04/23/2023 DOS: the patient was seen and examined on 04/24/2023 PCP: Oneal Grout, FNP  Patient coming from: Home  Chief Complaint:  Chief Complaint  Patient presents with   Shortness of Breath   HPI: Victoria Dean is a 73 y.o. female with medical history significant of hypertension, hyperlipidemia,  breast cancer on letrozole, chronic anxiety/depression,  GERD, morbid obesity who presents to the emergency department due to 3 days of progressively worsening shortness of breath, she complained of increased bilateral lower extremity swelling and increased abdominal girth.  She states that she was once told that she had COPD, though she denied having any associated wheezing.  Patient endorsed having back pain with which she has been taking shallow breaths due to worsening back pain on taking deep breaths.  She denies cough, chest congestion, fever, chills, nausea or vomiting.  Patient was admitted from 12/9 to 12/18 due to acute hypoxic respiratory failure secondary to parainfluenza and multifocal pneumonia.  She states that she was recently treated with prednisone by PCP and believes that she gained about 13 pounds.  ED Course:  In emergency department, patient was tachypneic with respiratory rate of 24/min, but other vital signs were within normal range.  Workup in the ED showed normal CBC except for hemoglobin of 11.2, BMP was normal, BNP 42.0, D-dimer 0134, troponin 14. CT angiography chest with contrast showed no evidence of arterial embolism or dilatation, but showed mild diffuse bronchial thickening.  No focal pneumonia.  Increased linear atelectasis right lower lobe. 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 Breathing treatment was provided, IV Lasix 20 mg x 1 was given, IV Solu-Medrol 125 mg x 1 was given.  Hospitalist was asked to admit patient for  further evaluation and management.  Review of Systems: Review of systems as noted in the HPI. All other systems reviewed and are negative.   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    Vitamin D deficiency    Past Surgical History:  Procedure Laterality Date   ABDOMINAL HYSTERECTOMY  1995   APPENDECTOMY  1994   BREAST BIOPSY Right 1998   neg   BREAST BIOPSY Left 05/30/2020   stere bx x clip path pending   BREAST LUMPECTOMY WITH SENTINEL LYMPH NODE BIOPSY Left 06/29/2020   Procedure: BREAST LUMPECTOMY WITH SENTINEL LYMPH NODE BX;  Surgeon: Earline Mayotte, MD;  Location: [facility] ORS;  Service: General;  Laterality: Left;   CATARACT EXTRACTION W/PHACO Left 01/31/2022   Procedure: CATARACT EXTRACTION PHACO AND INTRAOCULAR LENS PLACEMENT (IOC);  Surgeon: Fabio Pierce, MD;  Location: [facility];  Service: Ophthalmology;  Laterality: Left;  CDE 8.10   CATARACT EXTRACTION W/PHACO Right 02/21/2022   Procedure: CATARACT EXTRACTION PHACO AND INTRAOCULAR LENS PLACEMENT (IOC);  Surgeon: Fabio Pierce, MD;  Location: [facility];  Service: Ophthalmology;  Laterality: Right;  CDE: 9.42   ESI     LUMBAR MICRODISCECTOMY  2019   L3-4   LUMBAR MICRODISCECTOMY  2008   L4-L5 repeated   MICRODISCECTOMY LUMBAR  2008   L4-5   MICRODISCECTOMY LUMBAR  2010   L4-L5 repeated   NODE DISSECTION Left 08/13/2020   Procedure: AXILLARY NODE DISSECTION;  Surgeon: Earline Mayotte, MD;  Location: [facility] OR[facility];  Service: General;  Laterality: Left;    PORTACATH PLACEMENT Right 09/05/2020   Procedure: INSERTION PORT-A-CATH;  Surgeon: Earline Mayotte, MD;  Location: [facility] OR[facility];  Service: General;  Laterality: Right;  right; monitor anesthesia care   SQUAMOUS CELL CARCINOMA EXCISION     TONSILLECTOMY  1971    Social History:  reports that she quit smoking about 5 years ago. Her smoking use included cigarettes. She started smoking about 38 years ago. She has a 30 pack-year smoking history. She has never used smokeless tobacco. She reports current alcohol use. She reports that she does not use drugs.   Allergies  Allergen Reactions   Other Itching and Other (See Comments)    Brand name bandaids with cloth backing causes redness and itching   Statins     Muscle weakness   Stiolto Respimat [Tiotropium Bromide-Olodaterol]    Zetia [Ezetimibe]     Leg pain, upset stomach   Sulfa Antibiotics Rash    Family History  Problem Relation Age of Onset   Breast cancer Mother 65   Osteosarcoma Mother        d. 6   Hypertension Brother    COPD Father    Hypertension Father    Breast cancer Paternal Aunt        dx 52s   Breast cancer Paternal Grandmother        dx 72s   Stomach cancer Paternal Grandfather        dx 75s   Breast cancer Paternal Aunt        dx 28s   Brain cancer Paternal Aunt        dx 59s     Prior to Admission medications   Medication Sig Start Date End Date Taking? Authorizing Provider  acetaminophen (TYLENOL) 650 MG CR tablet Take 1,300 mg by mouth every 8 (eight) hours as needed for pain.    [provider]  albuterol (PROVENTIL) (2.5 MG/3ML) 0.083% nebulizer solution Take 2.5 mg by nebulization 4 (four) times daily as needed for wheezing or shortness of breath.    [provider]  albuterol (VENTOLIN HFA) 108 (90 Base) MCG/ACT inhaler Inhale 2 puffs into the lungs every 6 (six) hours as needed for wheezing or shortness of breath.    [provider]  amLODipine (NORVASC) 5 MG tablet Take by  mouth. 10/09/22   [provider]  aspirin EC 81 MG tablet Take 1 tablet (81 mg total) by mouth daily. Swallow whole. 06/29/22 06/29/23  GhimireWerner Lean, MD  B-D 3CC LUER-LOK SYR 25GX1" 25G X 1" 3 ML MISC USE 1 SYRINGE EVERY 30 DAYS 01/05/23   Creig Hines, MD  Biotin 1000 MCG tablet Take 1,000 mcg by mouth daily.    [provider]  cholecalciferol (VITAMIN D) 25 MCG (1000 UNIT) tablet Take 1,000 Units by mouth daily.    [provider]  clobetasol (TEMOVATE) 0.05 % external solution Apply topically daily as needed. 12/11/22   [provider]  cyanocobalamin (VITAMIN B12) 1000 MCG/ML injection INJECT  1 ML (1,000 MCG TOTAL) INTO THE MUSCLE EVERY 30 (THIRTY) DAYS. 01/05/23   Creig Hines, MD  famotidine (PEPCID) 10 MG tablet Take by mouth. Take 10 mg by mouth 2 (two) times daily as needed for Heartburn    [provider]  fluticasone-salmeterol (ADVAIR) 100-50 MCG/ACT AEPB Inhale 1 puff into the lungs 2 (two) times daily. 03/17/23   [provider]  guaiFENesin-dextromethorphan (ROBITUSSIN DM) 100-10 MG/5ML syrup Take 10 mLs by mouth every 8 (eight) hours as needed for cough. 01/21/23   Vassie Loll, MD  hydrocortisone 2.5 % lotion Apply 1 application topically daily as needed (when washing face).    [provider]  letrozole [facility]) 2.5 MG tablet TAKE 1 TABLET EVERY DAY 01/05/23   Creig Hines, MD  losartan (COZAAR) 100 MG tablet TAKE 1 TABLET EVERY DAY 05/10/21   Bosie Clos, MD  Menthol, Topical Analgesic, (BIOFREEZE EX) Apply 1 application topically daily as needed (back pain).    [provider]  mometasone (ELOCON) 0.1 % cream Apply 1 application topically daily as needed (ear irritation).    [provider]  pantoprazole (PROTONIX) 40 MG tablet Take 1 tablet (40 mg total) by mouth daily. 01/21/23 01/21/24  Vassie Loll, MD  predniSONE (STERAPRED UNI-PAK 21 TAB) 10 MG (21) TBPK tablet Take 10 mg by mouth  as directed. Dose pack 03/20/23   [provider]  propranolol (INDERAL) 10 MG tablet TAKE 1 OR 2 TABLETS BY MOUTH THREE TIMES DAILY AS NEEDED FOR ANXIETY 02/05/23   [provider]  Propylene Glycol (SYSTANE COMPLETE) 0.6 % SOLN Apply to eye.    [provider]  rosuvastatin (CRESTOR) 5 MG tablet Take 1 tablet (5 mg total) by mouth daily. 05/22/21   Bosie Clos, MD  Semaglutide,0.25 or 0.5MG /DOS, (OZEMPIC, 0.25 OR 0.5 MG/DOSE,) 2 MG/1.5ML SOPN Inject 0.25 mg every week by subcutaneous route for 28 days. 07/03/22   [provider]  venlafaxine XR (EFFEXOR-XR) 75 MG 24 hr capsule TAKE 3 CAPSULES (225 MG TOTAL) BY MOUTH DAILY WITH BREAKFAST. Patient taking differently: Take 150 mg by mouth daily with breakfast. 2 tablets at bedtime 01/10/21   Bosie Clos, MD  zoledronic acid (ZOMETA) 4 MG/5ML injection Inject 4 mg into the vein once.    [provider]    Physical Exam: BP (!) 149/78   Pulse 96   Temp 98 F (36.7 C) (Oral)   Resp (!) 23   Ht 5\' 3"  (1.6 m)   Wt 110 kg   SpO2 96%   BMI 42.96 kg/m   General: 73 y.o. year-old female well developed well nourished in no acute distress.  Alert and oriented x3. HEENT: NCAT, EOMI Neck: Supple, trachea medial Cardiovascular: Regular rate and rhythm with no rubs or gallops.  No thyromegaly or JVD noted.  No lower extremity edema. 2/4 pulses in all 4 extremities. Respiratory: Bilateral lower rhonchi with no wheezes.  Patient speaks in short sentences due to needing to pause to breathe. Abdomen: Soft, nontender nondistended with normal bowel sounds x4 quadrants. Muskuloskeletal: No cyanosis, clubbing or edema noted bilaterally Neuro: CN II-XII intact, strength 5/5 x 4, sensation, reflexes intact Skin: No ulcerative lesions noted or rashes Psychiatry: Judgement and insight appear normal. Mood is appropriate for condition and setting          Labs on Admission:  Basic Metabolic Panel: Recent  Labs  Lab 04/23/23 2024  NA 139  K 3.7  CL 102  CO2 24  GLUCOSE 91  BUN 19  CREATININE 0.89  CALCIUM 9.4   Liver Function Tests: Recent Labs  Lab 04/23/23 2024  AST 20  ALT 19  ALKPHOS 100  BILITOT 0.3  PROT 7.0  ALBUMIN 3.9   No results for input(s): "LIPASE", "AMYLASE" in the last 168 hours. No results for input(s): "AMMONIA" in the last 168 hours. CBC: Recent Labs  Lab 04/23/23 2024  WBC 7.6  NEUTROABS 4.8  HGB 11.2*  HCT 36.2  MCV 89.8  PLT 269   Cardiac Enzymes: No results for input(s): "CKTOTAL", "CKMB", "CKMBINDEX", "TROPONINI" in the last 168 hours.  BNP (last 3 results) Recent Labs    01/12/23 0300 04/23/23 2024  BNP 65.0 42.0    ProBNP (last 3 results) No results for input(s): "PROBNP" in the last 8760 hours.  CBG: No results for input(s): "GLUCAP" in the last 168 hours.  Radiological Exams on Admission: CT Angio Chest PE W/Cm &/Or Wo Cm Result Date: 04/23/2023 CLINICAL DATA:  Shortness of breath onset yesterday worsening today. Prior history of breast cancer 2022. Admitted for pneumonia last December. EXAM: CT ANGIOGRAPHY CHEST WITH CONTRAST TECHNIQUE: Multidetector CT imaging of the chest was performed using the standard protocol during bolus administration of intravenous contrast. Multiplanar CT image reconstructions and MIPs were obtained to evaluate the vascular anatomy. RADIATION DOSE REDUCTION: This exam was performed according to the departmental dose-optimization program which includes automated exposure control, adjustment of the mA and/or kV according to patient size and/or use of iterative reconstruction technique. CONTRAST:  75mL OMNIPAQUE IOHEXOL 350 MG/ML SOLN COMPARISON:  PA Lat chest today, PA la old t chest 03/04/2023, CTA chest 01/12/2023, CTA chest 01/19/2021. FINDINGS: Cardiovascular: The cardiac size is normal. There is lipomatous hypertrophy the interatrial septum. There are patchy three-vessel coronary calcific plaques. No  pericardial effusion or venous distention. The aorta is tortuous with scattered calcified plaques in the aorta and great vessels. There is no aneurysm, stenosis or dissection. The pulmonary arteries are normal caliber and do not show evidence of arterial embolism. Mediastinum/Nodes: No axillary or intrathoracic adenopathy or thyroid nodule. There are left axillary surgical clips. Unremarkable thoracic trachea, thoracic esophagus. Lungs/Pleura: There is mild diffuse bronchial thickening. Increased linear atelectasis right lower lobe. Few linear scar-like opacities both lung bases. There is no consolidation, effusion or pneumothorax, or appreciable lung nodules. Upper Abdomen: No acute abnormality. Abdominal aortic atherosclerosis. Mild hepatic steatosis. Musculoskeletal: Multilevel thoracic spine bridging enthesopathy. Chronic left paracentral calcified disc extrusion is again noted T6-7 level displacing the thoracic cord posteriorly. No acute or other significant osseous findings. Postoperative changes again noted left breast, mild asymmetric thin skin thickening chronically seen as well as an overlying external breast prosthesis. Review of the MIP images confirms the above findings. IMPRESSION: 1. No evidence of arterial embolism or dilatation. 2. Aortic and coronary artery atherosclerosis. 3. Mild diffuse bronchial thickening. No focal pneumonia. Increased linear atelectasis right lower lobe. 4. Hepatic steatosis. 5. 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 6. Chronic postoperative changes left breast and axilla. No chest wall or axillary mass is seen. Electronically Signed   By: Almira Bar M.D.   On: 04/23/2023 22:03   DG Chest 2 View Result Date: 04/23/2023 CLINICAL DATA:  Short of breath, difficulty breathing EXAM: CHEST - 2 VIEW COMPARISON:  03/04/2023 FINDINGS: Frontal and lateral views of the chest demonstrate an unremarkable cardiac silhouette.  No acute airspace disease, effusion, or pneumothorax. No acute bony abnormalities. IMPRESSION:  1. No acute intrathoracic process. Electronically Signed   By: Sharlet Salina M.D.   On: 04/23/2023 20:01    EKG: I independently viewed the EKG done and my findings are as followed: Normal sinus rhythm at a rate of 89 bpm  Assessment/Plan Present on Admission:  Mixed hyperlipidemia  GERD (gastroesophageal reflux disease)  Principal Problem:   Acute bronchitis Active Problems:   GERD (gastroesophageal reflux disease)   Mixed hyperlipidemia   Atelectasis of right lung   Acute on chronic back pain   History of breast cancer   Obesity, Class III, BMI 40-49.9 (morbid obesity) (HCC)  Acute bronchitis Atelectasis of right lung CT angiography of chest showed  Increased linear atelectasis of right lower lobe.  Continue Xopenex, Mucinex, Solu-Medrol, azithromycin. Continue Protonix to prevent steroid-induced ulcer Continue incentive spirometry and flutter valve  Acute on chronic back pain Continue oxycodone 5 mg every 4 hours as needed for moderate/severe pain  History of breast cancer Continue letrozole   Mixed Hyperlipidemia Continue Crestor   GERD Continue Protonix  Morbid obesity class III BMI 42.96 Patient was counseled about the cardiovascular and metabolic risk of morbid obesity. Patient was counseled for diet control, exercise regimen and weight loss.    DVT prophylaxis: Lovenox  Code Status: Full code  Family Communication: None at bedside  Consults: None  Severity of Illness: The appropriate patient status for this patient is OBSERVATION. Observation status is judged to be reasonable and necessary in order to provide the required intensity of service to ensure the patient's safety. The patient's presenting symptoms, physical exam findings, and initial radiographic and laboratory data in the context of their medical condition is felt to place them at decreased risk for  further clinical deterioration. Furthermore, it is anticipated that the patient will be medically stable for discharge from the hospital within 2 midnights of admission.   Author: Frankey Shown, DO 04/24/2023 4:28 AM  For on call review [email or web address].

## 2023-04-23 NOTE — ED Triage Notes (Signed)
 Also endorse pain in knees and hips, Chronic back pain

## 2023-04-23 NOTE — ED Triage Notes (Signed)
 Pt from home complains of SOB that started 3/19, and has gotten worse today. Hx of Breast Cancer in 2022, also states that she was told that she has COPD.  Admitted for pneumonia last December. States Doesn't use O2 @ home. Also endorses generalized swelling and and chest tightness. States that she also just finished a round of prednisone and gained 13 lbs in 6 days. Currently sating 97% on RA. Hx of HTN

## 2023-04-24 DIAGNOSIS — K219 Gastro-esophageal reflux disease without esophagitis: Secondary | ICD-10-CM | POA: Diagnosis present

## 2023-04-24 DIAGNOSIS — Z800 Family history of malignant neoplasm of digestive organs: Secondary | ICD-10-CM | POA: Diagnosis not present

## 2023-04-24 DIAGNOSIS — Z808 Family history of malignant neoplasm of other organs or systems: Secondary | ICD-10-CM | POA: Diagnosis not present

## 2023-04-24 DIAGNOSIS — Z8249 Family history of ischemic heart disease and other diseases of the circulatory system: Secondary | ICD-10-CM | POA: Diagnosis not present

## 2023-04-24 DIAGNOSIS — G8929 Other chronic pain: Secondary | ICD-10-CM | POA: Insufficient documentation

## 2023-04-24 DIAGNOSIS — Z7982 Long term (current) use of aspirin: Secondary | ICD-10-CM | POA: Diagnosis not present

## 2023-04-24 DIAGNOSIS — J470 Bronchiectasis with acute lower respiratory infection: Secondary | ICD-10-CM | POA: Diagnosis present

## 2023-04-24 DIAGNOSIS — Z853 Personal history of malignant neoplasm of breast: Secondary | ICD-10-CM | POA: Diagnosis not present

## 2023-04-24 DIAGNOSIS — I10 Essential (primary) hypertension: Secondary | ICD-10-CM | POA: Diagnosis present

## 2023-04-24 DIAGNOSIS — J9811 Atelectasis: Secondary | ICD-10-CM | POA: Insufficient documentation

## 2023-04-24 DIAGNOSIS — E66813 Obesity, class 3: Secondary | ICD-10-CM | POA: Diagnosis present

## 2023-04-24 DIAGNOSIS — Z79811 Long term (current) use of aromatase inhibitors: Secondary | ICD-10-CM | POA: Diagnosis not present

## 2023-04-24 DIAGNOSIS — Z87891 Personal history of nicotine dependence: Secondary | ICD-10-CM | POA: Diagnosis not present

## 2023-04-24 DIAGNOSIS — J440 Chronic obstructive pulmonary disease with acute lower respiratory infection: Secondary | ICD-10-CM | POA: Diagnosis present

## 2023-04-24 DIAGNOSIS — J441 Chronic obstructive pulmonary disease with (acute) exacerbation: Secondary | ICD-10-CM | POA: Diagnosis present

## 2023-04-24 DIAGNOSIS — F329 Major depressive disorder, single episode, unspecified: Secondary | ICD-10-CM | POA: Diagnosis present

## 2023-04-24 DIAGNOSIS — Z803 Family history of malignant neoplasm of breast: Secondary | ICD-10-CM | POA: Diagnosis not present

## 2023-04-24 DIAGNOSIS — M549 Dorsalgia, unspecified: Secondary | ICD-10-CM | POA: Insufficient documentation

## 2023-04-24 DIAGNOSIS — T380X5A Adverse effect of glucocorticoids and synthetic analogues, initial encounter: Secondary | ICD-10-CM | POA: Diagnosis present

## 2023-04-24 DIAGNOSIS — Z6841 Body Mass Index (BMI) 40.0 and over, adult: Secondary | ICD-10-CM | POA: Diagnosis not present

## 2023-04-24 DIAGNOSIS — J209 Acute bronchitis, unspecified: Secondary | ICD-10-CM | POA: Diagnosis present

## 2023-04-24 DIAGNOSIS — J439 Emphysema, unspecified: Secondary | ICD-10-CM | POA: Diagnosis present

## 2023-04-24 DIAGNOSIS — Z825 Family history of asthma and other chronic lower respiratory diseases: Secondary | ICD-10-CM | POA: Diagnosis not present

## 2023-04-24 DIAGNOSIS — E782 Mixed hyperlipidemia: Secondary | ICD-10-CM | POA: Diagnosis present

## 2023-04-24 LAB — COMPREHENSIVE METABOLIC PANEL
ALT: 20 U/L (ref 0–44)
AST: 23 U/L (ref 15–41)
Albumin: 3.5 g/dL (ref 3.5–5.0)
Alkaline Phosphatase: 98 U/L (ref 38–126)
Anion gap: 10 (ref 5–15)
BUN: 18 mg/dL (ref 8–23)
CO2: 23 mmol/L (ref 22–32)
Calcium: 7.8 mg/dL — ABNORMAL LOW (ref 8.9–10.3)
Chloride: 103 mmol/L (ref 98–111)
Creatinine, Ser: 0.88 mg/dL (ref 0.44–1.00)
GFR, Estimated: >60 mL/min (ref >60)
Glucose, Bld: 169 mg/dL — ABNORMAL HIGH (ref 70–99)
Potassium: 3.6 mmol/L (ref 3.5–5.1)
Sodium: 136 mmol/L (ref 135–145)
Total Bilirubin: 0.4 mg/dL (ref 0.0–1.2)
Total Protein: 6.6 g/dL (ref 6.5–8.1)

## 2023-04-24 LAB — CBC
HCT: 34.1 % — ABNORMAL LOW (ref 36.0–46.0)
Hemoglobin: 10.5 g/dL — ABNORMAL LOW (ref 12.0–15.0)
MCH: 27.3 pg (ref 26.0–34.0)
MCHC: 30.8 g/dL (ref 30.0–36.0)
MCV: 88.6 fL (ref 80.0–100.0)
Platelets: 252 10*3/uL (ref 150–400)
RBC: 3.85 MIL/uL — ABNORMAL LOW (ref 3.87–5.11)
RDW: 16.1 % — ABNORMAL HIGH (ref 11.5–15.5)
WBC: 7 10*3/uL (ref 4.0–10.5)
nRBC: 0 % (ref 0.0–0.2)

## 2023-04-24 LAB — MAGNESIUM: Magnesium: 2 mg/dL (ref 1.7–2.4)

## 2023-04-24 LAB — PHOSPHORUS: Phosphorus: 2.6 mg/dL (ref 2.5–4.6)

## 2023-04-24 MED ORDER — DM-GUAIFENESIN ER 30-600 MG PO TB12
1.0000 | ORAL_TABLET | Freq: Two times a day (BID) | ORAL | Status: DC
  Administered 2023-04-24 – 2023-04-25 (×3): 1 via ORAL
  Filled 2023-04-24 (×3): qty 1

## 2023-04-24 MED ORDER — OXYCODONE HCL 5 MG PO TABS: 5.0000 mg | ORAL_TABLET | ORAL | Status: DC | PRN

## 2023-04-24 MED ORDER — ACETAMINOPHEN 650 MG RE SUPP: 650.0000 mg | Freq: Four times a day (QID) | RECTAL | Status: DC | PRN

## 2023-04-24 MED ORDER — ALBUTEROL SULFATE (2.5 MG/3ML) 0.083% IN NEBU: 2.5000 mg | INHALATION_SOLUTION | RESPIRATORY_TRACT | Status: DC | PRN

## 2023-04-24 MED ORDER — AZITHROMYCIN 250 MG PO TABS
500.0000 mg | ORAL_TABLET | Freq: Every day | ORAL | Status: AC
  Administered 2023-04-24: 500 mg via ORAL
  Filled 2023-04-24: qty 2

## 2023-04-24 MED ORDER — ONDANSETRON HCL 4 MG PO TABS: 4.0000 mg | ORAL_TABLET | Freq: Four times a day (QID) | ORAL | Status: DC | PRN

## 2023-04-24 MED ORDER — ALPRAZOLAM 0.5 MG PO TABS
0.5000 mg | ORAL_TABLET | Freq: Three times a day (TID) | ORAL | Status: DC | PRN
  Administered 2023-04-24: 0.5 mg via ORAL
  Filled 2023-04-24: qty 1

## 2023-04-24 MED ORDER — LEVALBUTEROL HCL 0.63 MG/3ML IN NEBU
0.6300 mg | INHALATION_SOLUTION | Freq: Two times a day (BID) | RESPIRATORY_TRACT | Status: DC
  Administered 2023-04-25: 0.63 mg via RESPIRATORY_TRACT
  Filled 2023-04-24: qty 3

## 2023-04-24 MED ORDER — METHYLPREDNISOLONE SODIUM SUCC 40 MG IJ SOLR
40.0000 mg | Freq: Two times a day (BID) | INTRAMUSCULAR | Status: DC
  Administered 2023-04-24 – 2023-04-25 (×3): 40 mg via INTRAVENOUS
  Filled 2023-04-24 (×3): qty 1

## 2023-04-24 MED ORDER — ARFORMOTEROL TARTRATE 15 MCG/2ML IN NEBU
15.0000 ug | INHALATION_SOLUTION | Freq: Two times a day (BID) | RESPIRATORY_TRACT | Status: DC
  Administered 2023-04-24 – 2023-04-25 (×3): 15 ug via RESPIRATORY_TRACT
  Filled 2023-04-24 (×3): qty 2

## 2023-04-24 MED ORDER — ONDANSETRON HCL 4 MG/2ML IJ SOLN: 4.0000 mg | Freq: Four times a day (QID) | INTRAMUSCULAR | Status: DC | PRN

## 2023-04-24 MED ORDER — ACETAMINOPHEN 325 MG PO TABS: 650.0000 mg | ORAL_TABLET | Freq: Four times a day (QID) | ORAL | Status: DC | PRN

## 2023-04-24 MED ORDER — PANTOPRAZOLE SODIUM 40 MG PO TBEC
40.0000 mg | DELAYED_RELEASE_TABLET | Freq: Every day | ORAL | Status: DC
  Administered 2023-04-24 – 2023-04-25 (×2): 40 mg via ORAL
  Filled 2023-04-24 (×2): qty 1

## 2023-04-24 MED ORDER — ENOXAPARIN SODIUM 40 MG/0.4ML IJ SOSY
40.0000 mg | PREFILLED_SYRINGE | INTRAMUSCULAR | Status: DC
  Administered 2023-04-24 – 2023-04-25 (×2): 40 mg via SUBCUTANEOUS
  Filled 2023-04-24 (×2): qty 0.4

## 2023-04-24 MED ORDER — LEVALBUTEROL HCL 0.63 MG/3ML IN NEBU
0.6300 mg | INHALATION_SOLUTION | Freq: Four times a day (QID) | RESPIRATORY_TRACT | Status: DC
  Administered 2023-04-24 (×3): 0.63 mg via RESPIRATORY_TRACT
  Filled 2023-04-24 (×3): qty 3

## 2023-04-24 MED ORDER — BUDESONIDE 0.5 MG/2ML IN SUSP
0.5000 mg | Freq: Two times a day (BID) | RESPIRATORY_TRACT | Status: DC
  Administered 2023-04-24 – 2023-04-25 (×3): 0.5 mg via RESPIRATORY_TRACT
  Filled 2023-04-24 (×3): qty 2

## 2023-04-24 MED ORDER — AZITHROMYCIN 250 MG PO TABS
250.0000 mg | ORAL_TABLET | Freq: Every day | ORAL | Status: DC
  Administered 2023-04-25: 250 mg via ORAL
  Filled 2023-04-24: qty 1

## 2023-04-24 NOTE — ED Notes (Signed)
 Pt given water and am sitting on the side of the bed due to her back hurting.

## 2023-04-24 NOTE — ED Notes (Signed)
 PT states coffee makes her sick on her stomach

## 2023-04-24 NOTE — Plan of Care (Signed)
   Problem: Education: Goal: Knowledge of General Education information will improve Description Including pain rating scale, medication(s)/side effects and non-pharmacologic comfort measures Outcome: Progressing   Problem: Health Behavior/Discharge Planning: Goal: Ability to manage health-related needs will improve Outcome: Progressing

## 2023-04-24 NOTE — Progress Notes (Signed)
 Progress Note   Patient: Victoria Dean DOB: [birth year] DOA: 04/23/2023     0 DOS: the patient was seen and examined on 04/24/2023   Brief hospital admission narrative: As per H&P written by Dr. Thomes Dinning on 04/23/2023  Victoria Dean is a 73 y.o. female with medical history significant of hypertension, hyperlipidemia,  breast cancer on letrozole, chronic anxiety/depression,  GERD, morbid obesity who presents to the emergency department due to 3 days of progressively worsening shortness of breath, she complained of increased bilateral lower extremity swelling and increased abdominal girth.  She states that she was once told that she had COPD, though she denied having any associated wheezing.  Patient endorsed having back pain with which she has been taking shallow breaths due to worsening back pain on taking deep breaths.  She denies cough, chest congestion, fever, chills, nausea or vomiting.   Patient was admitted from 12/9 to 12/18 due to acute hypoxic respiratory failure secondary to parainfluenza and multifocal pneumonia.  She states that she was recently treated with prednisone by PCP and believes that she gained about 13 pounds.   ED Course:  In emergency department, patient was tachypneic with respiratory rate of 24/min, but other vital signs were within normal range.  Workup in the ED showed normal CBC except for hemoglobin of 11.2, BMP was normal, BNP 42.0, D-dimer 0134, troponin 14. CT angiography chest with contrast showed no evidence of arterial embolism or dilatation, but showed mild diffuse bronchial thickening.  No focal pneumonia.  Increased linear atelectasis right lower lobe. 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 Breathing treatment was provided, IV Lasix 20 mg x 1 was given, IV Solu-Medrol 125 mg x 1 was given.  Hospitalist was asked to admit patient for further evaluation and management.  Assessment and  Plan: 1-acute exacerbation of COPD with bronchitis/bronchiectasis -Continue treatment with steroids, mucolytic's, bronchodilator/nebulizer management and the use of azithromycin -Continue PPI for GI prophylaxis and the steroid induced ulcer. -No requiring oxygen supplementation -Patient encouraged to use flutter valve/incentive spirometer -Follow clinical response.  2-chronic back pain -Continue as needed analgesics  3-GERD -Continue PPI  4-mixed hyperlipidemia -Continue Crestor -Heart healthy diet discussed with patient.  5-history of breast cancer -Continue letrozole  6-class III obesity -Body mass index is 42.96 kg/m. -Low-calorie diet, portion control and increase physical activity discussed with patient.  7-hypertension -Continue treatment with Norvasc, Inderal and Cozaar -Follow vital sign -Heart healthy/low-sodium diet discussed with patient  Subjective:  Afebrile, still taking, complaining of intermittent coughing spells and feeling short winded with activity.  Mild difficulty speaking in full sentences appreciated on examination.  Physical Exam: Vitals:   04/24/23 0930 04/24/23 1200 04/24/23 1234 04/24/23 1400  BP:   (!) 159/80 (!) 144/64  Pulse:  98 98 (!) 108  Resp:   (!) 25 20  Temp:   99.7 F (37.6 C) 98.3 F (36.8 C)  TempSrc:   Oral Oral  SpO2: 98% 92% 94% 95%  Weight:      Height:       General exam: Alert, awake, oriented x 3; no fever. Respiratory system: Fair air movement bilaterally; posterior rhonchi and expiratory wheezing appreciated on exam. Cardiovascular system:RRR. No rubs or gallops; no JVD. Gastrointestinal system: Abdomen is obese, nondistended, soft and nontender. No organomegaly or masses felt. Normal bowel sounds heard. Central nervous system: No focal neurological deficits. Extremities: No cyanosis or clubbing. Skin: No petechiae. Psychiatry: Judgement and insight appear normal. Mood &  affect appropriate.   Data  Reviewed: Comprehensive metabolic panel: Sodium 136, potassium 3.6, chloride 103, bicarb 23, BUN 18, creatinine 0.88, normal LFTs and GFR >60 CBC: White blood cells 7.0, hemoglobin 10.5 and platelet count 252K Magnesium:2.0  Family Communication: No family at bedside.  Disposition: Status is: Inpatient Remains inpatient appropriate because: IV steroids and bronchodilator management.   Planned Discharge Destination: Home  Time spent: 50 minutes  Author: Vassie Loll, MD 04/24/2023 3:44 PM  For on call review [email or web address].

## 2023-04-24 NOTE — ED Notes (Signed)
 Pt ambulated to restroom.

## 2023-04-24 NOTE — Progress Notes (Signed)
   04/24/23 1228  TOC Brief Assessment  Insurance and Status Reviewed  Patient has primary care physician Yes  Home environment has been reviewed Home alone  Prior level of function: Independent  Prior/Current Home Services No current home services  Social Drivers of Health Review SDOH reviewed no interventions necessary  Readmission risk has been reviewed Yes  Transition of care needs no transition of care needs at this time   Transition of Care Department [facility]) has reviewed patient and no TOC needs have been identified at this time. We will continue to monitor patient advancement through interdisciplinary progression rounds. If new patient transition needs arise, please place a TOC consult.

## 2023-04-25 DIAGNOSIS — J441 Chronic obstructive pulmonary disease with (acute) exacerbation: Secondary | ICD-10-CM | POA: Diagnosis not present

## 2023-04-25 DIAGNOSIS — Z853 Personal history of malignant neoplasm of breast: Secondary | ICD-10-CM | POA: Diagnosis not present

## 2023-04-25 DIAGNOSIS — J209 Acute bronchitis, unspecified: Secondary | ICD-10-CM | POA: Diagnosis not present

## 2023-04-25 LAB — BASIC METABOLIC PANEL
Anion gap: 8 (ref 5–15)
BUN: 22 mg/dL (ref 8–23)
CO2: 24 mmol/L (ref 22–32)
Calcium: 9.7 mg/dL (ref 8.9–10.3)
Chloride: 107 mmol/L (ref 98–111)
Creatinine, Ser: 0.89 mg/dL (ref 0.44–1.00)
GFR, Estimated: >60 mL/min (ref >60)
Glucose, Bld: 176 mg/dL — ABNORMAL HIGH (ref 70–99)
Potassium: 4.6 mmol/L (ref 3.5–5.1)
Sodium: 139 mmol/L (ref 135–145)

## 2023-04-25 LAB — CBC
HCT: 34.5 % — ABNORMAL LOW (ref 36.0–46.0)
Hemoglobin: 10.6 g/dL — ABNORMAL LOW (ref 12.0–15.0)
MCH: 27.7 pg (ref 26.0–34.0)
MCHC: 30.7 g/dL (ref 30.0–36.0)
MCV: 90.1 fL (ref 80.0–100.0)
Platelets: 261 10*3/uL (ref 150–400)
RBC: 3.83 MIL/uL — ABNORMAL LOW (ref 3.87–5.11)
RDW: 16.3 % — ABNORMAL HIGH (ref 11.5–15.5)
WBC: 10.5 10*3/uL (ref 4.0–10.5)
nRBC: 0 % (ref 0.0–0.2)

## 2023-04-25 MED ORDER — FUROSEMIDE 20 MG PO TABS: 20.0000 mg | ORAL_TABLET | Freq: Every morning | ORAL | 0 refills | Status: DC

## 2023-04-25 MED ORDER — OXYCODONE HCL 5 MG PO TABS: 5.0000 mg | ORAL_TABLET | Freq: Four times a day (QID) | ORAL | 0 refills | Status: DC | PRN

## 2023-04-25 MED ORDER — ALBUTEROL SULFATE (2.5 MG/3ML) 0.083% IN NEBU: 2.5000 mg | INHALATION_SOLUTION | Freq: Four times a day (QID) | RESPIRATORY_TRACT | 12 refills | Status: DC | PRN

## 2023-04-25 MED ORDER — AZITHROMYCIN 500 MG PO TABS: 500.0000 mg | ORAL_TABLET | Freq: Every day | ORAL | 0 refills | Status: AC

## 2023-04-25 MED ORDER — PREDNISONE 20 MG PO TABS: 40.0000 mg | ORAL_TABLET | Freq: Every day | ORAL | 0 refills | Status: AC

## 2023-04-25 MED ORDER — ACETAMINOPHEN 325 MG PO TABS: 650.0000 mg | ORAL_TABLET | ORAL | Status: AC | PRN

## 2023-04-25 MED ORDER — ALBUTEROL SULFATE HFA 108 (90 BASE) MCG/ACT IN AERS: 2.0000 | INHALATION_SPRAY | Freq: Four times a day (QID) | RESPIRATORY_TRACT | 1 refills | Status: AC | PRN

## 2023-04-25 MED ORDER — FUROSEMIDE 40 MG PO TABS: 40.0000 mg | ORAL_TABLET | Freq: Every morning | ORAL | 0 refills | Status: AC

## 2023-04-25 MED ORDER — AMLODIPINE BESYLATE 5 MG PO TABS
5.0000 mg | ORAL_TABLET | Freq: Every day | ORAL | Status: DC
  Administered 2023-04-25: 5 mg via ORAL
  Filled 2023-04-25: qty 1

## 2023-04-25 MED ORDER — ASPIRIN 81 MG PO TBEC: 81.0000 mg | DELAYED_RELEASE_TABLET | Freq: Every day | ORAL | Status: AC

## 2023-04-25 NOTE — TOC Transition Note (Signed)
 Transition of Care [facility]) - Discharge Note   Patient Details  Name: Victoria Dean MRN: [id number] Date of Birth: Apr 22, 1950  Transition of Care [facility]) CM/SW Contact:  Leitha Bleak, RN Phone Number: 04/25/2023, 11:56 AM   Clinical Narrative:   Discharging home. Weaning off oxygen. Currently 95% on room air. No TOC needs.    Final next level of care: Home/Self Care Barriers to Discharge: No Barriers Identified   Patient Goals and CMS Choice Patient states their goals for this hospitalization and ongoing recovery are:: return Home CMS Medicare.gov Compare Post Acute Care list provided to:: Patient    Social Drivers of Health (SDOH) Interventions SDOH Screenings   Food Insecurity: No Food Insecurity (04/24/2023)  Housing: Low Risk  (04/24/2023)  Transportation Needs: No Transportation Needs (04/24/2023)  Utilities: Not At Risk (04/24/2023)  Alcohol Screen: Low Risk  (10/23/2021)  Depression (PHQ2-9): Low Risk  (10/23/2021)  Financial Resource Strain: Low Risk  (10/23/2021)  Physical Activity: Inactive (02/16/2018)  Social Connections: Moderately Isolated (04/24/2023)  Stress: No Stress Concern Present (02/16/2018)  Tobacco Use: Medium Risk (04/23/2023)     Readmission Risk Interventions    01/12/2023   12:48 PM  Readmission Risk Prevention Plan  Medication Screening Complete  Transportation Screening Complete

## 2023-04-25 NOTE — Discharge Summary (Signed)
 Victoria Dean, is a 73 y.o. female  DOB 08-11-1950  MRN [id number].  Admission date:  04/23/2023  Admitting Physician  Frankey Shown, DO  Discharge Date:  04/25/2023   Primary MD  Oneal Grout, FNP  Recommendations for primary care physician for things to follow:  1)Very Low-salt diet advised---Less than 2 gm of Sodium per day advised----ok to use Mrs DASH salt substitute instead of Salt 2)Weigh yourself daily, call if you gain more than 3 pounds in 1 day or more than 5 pounds in 1 week as your diuretic medications may need to be adjusted 3)Please Note that there has been changes to your medications  Admission Diagnosis  Anasarca [R60.1] Acute exacerbation of chronic obstructive pulmonary disease (COPD) (HCC) [J44.1] COPD exacerbation (HCC) [J44.1]   Discharge Diagnosis  Anasarca [R60.1] Acute exacerbation of chronic obstructive pulmonary disease (COPD) (HCC) [J44.1] COPD exacerbation (HCC) [J44.1]    Principal Problem:   Acute bronchitis Active Problems:   GERD (gastroesophageal reflux disease)   Mixed hyperlipidemia   Atelectasis of right lung   Acute on chronic back pain   History of breast cancer   Obesity, Class III, BMI 40-49.9 (morbid obesity) (HCC)   Acute exacerbation of chronic obstructive pulmonary disease (COPD) (HCC)      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    Vitamin D deficiency     Past Surgical  History:  Procedure Laterality Date   ABDOMINAL HYSTERECTOMY  1995   APPENDECTOMY  1994   BREAST BIOPSY Right 1998   neg   BREAST BIOPSY Left 05/30/2020   stere bx x clip path pending   BREAST LUMPECTOMY WITH SENTINEL LYMPH NODE BIOPSY Left 06/29/2020   Procedure: BREAST LUMPECTOMY WITH SENTINEL LYMPH NODE BX;  Surgeon: Earline Mayotte, MD;  Location: [facility] ORS;  Service: General;  Laterality: Left;   CATARACT EXTRACTION W/PHACO Left 01/31/2022   Procedure: CATARACT EXTRACTION PHACO AND INTRAOCULAR LENS PLACEMENT (IOC);  Surgeon: Fabio Pierce, MD;  Location: [facility];  Service: Ophthalmology;  Laterality: Left;  CDE 8.10   CATARACT EXTRACTION W/PHACO Right 02/21/2022   Procedure: CATARACT EXTRACTION PHACO AND INTRAOCULAR LENS PLACEMENT (IOC);  Surgeon: Fabio Pierce, MD;  Location: [facility];  Service: Ophthalmology;  Laterality: Right;  CDE: 9.42   ESI     LUMBAR MICRODISCECTOMY  2019   L3-4   LUMBAR MICRODISCECTOMY  2008   L4-L5 repeated  MICRODISCECTOMY LUMBAR  2008   L4-5   MICRODISCECTOMY LUMBAR  2010   L4-L5 repeated   NODE DISSECTION Left 08/13/2020   Procedure: AXILLARY NODE DISSECTION;  Surgeon: Earline Mayotte, MD;  Location: [facility];  Service: General;  Laterality: Left;   PORTACATH PLACEMENT Right 09/05/2020   Procedure: INSERTION PORT-A-CATH;  Surgeon: Earline Mayotte, MD;  Location: [facility] OR[facility];  Service: General;  Laterality: Right;  right; monitor anesthesia care   SQUAMOUS CELL CARCINOMA EXCISION     TONSILLECTOMY  1971       HPI  from the history and physical done on the day of admission:   HPI: SALOME HAUTALA is a 74 y.o. female with medical history significant of hypertension, hyperlipidemia,  breast cancer on letrozole, chronic anxiety/depression,  GERD, morbid obesity who presents to the emergency department due to 3 days of progressively worsening shortness of breath, she complained of increased bilateral lower extremity swelling and increased abdominal girth.   She states that she was once told that she had COPD, though she denied having any associated wheezing.  Patient endorsed having back pain with which she has been taking shallow breaths due to worsening back pain on taking deep breaths.  She denies cough, chest congestion, fever, chills, nausea or vomiting.   Patient was admitted from 12/9 to 12/18 due to acute hypoxic respiratory failure secondary to parainfluenza and multifocal pneumonia.  She states that she was recently treated with prednisone by PCP and believes that she gained about 13 pounds.   ED Course:  In emergency department, patient was tachypneic with respiratory rate of 24/min, but other vital signs were within normal range.  Workup in the ED showed normal CBC except for hemoglobin of 11.2, BMP was normal, BNP 42.0, D-dimer 0134, troponin 14. CT angiography chest with contrast showed no evidence of arterial embolism or dilatation, but showed mild diffuse bronchial thickening.  No focal pneumonia.  Increased linear atelectasis right lower lobe. 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 Breathing treatment was provided, IV Lasix 20 mg x 1 was given, IV Solu-Medrol 125 mg x 1 was given.  Hospitalist was asked to admit patient for further evaluation and management.   Review of Systems: Review of systems as noted in the HPI. All other systems reviewed and are negative.   Hospital Course:     1-acute exacerbation of COPD with bronchitis/bronchiectasis -Much improved after treatment with steroids, mucolytic's, bronchodilator/nebulizer management and  azithromycin =-No hypoxia -Discharge on prednisone and azithromycin -Patient encouraged to use flutter valve/incentive spirometer   2-chronic back pain -Continue as needed analgesics   3-GERD -Continue PPI especially while on steroids   4-mixed hyperlipidemia -Continue Crestor -Heart healthy diet discussed with patient.    5-history of breast cancer -Continue letrozole   6-class III obesity -Body mass index is 42.96 kg/m. -Low-calorie diet, portion control and increase physical activity discussed with patient.   7-hypertension -Continue Norvasc, Inderal and Cozaar -Heart healthy/low-sodium diet discussed with patient  Discharge Condition: stable, without Hypoxia  Follow UP   Follow-up Information     Oneal Grout, FNP. Schedule an appointment as soon as possible for a visit.   Specialty: Family Medicine Why: If symptoms worsen, As needed Contact information: [address] [place] [address] [phone]                Diet and Activity recommendation:  As advised  Discharge Instructions    Discharge Instructions  Ambulatory Referral for Lung Cancer Scre   Complete by: As directed    Call MD for:  difficulty breathing, headache or visual disturbances   Complete by: As directed    Call MD for:  persistant dizziness or light-headedness   Complete by: As directed    Call MD for:  temperature >100.4   Complete by: As directed    Diet - low sodium heart healthy   Complete by: As directed    Discharge instructions   Complete by: As directed    1)Very Low-salt diet advised---Less than 2 gm of Sodium per day advised----ok to use Mrs DASH salt substitute instead of Salt 2)Weigh yourself daily, call if you gain more than 3 pounds in 1 day or more than 5 pounds in 1 week as your diuretic medications may need to be adjusted 3)Please Note that there has been changes to your medications   Increase activity slowly   Complete by: As directed        Discharge Medications     Allergies as of 04/25/2023       Reactions   Onion Nausea And Vomiting   Other Itching, Other (See Comments)   Brand name bandaids with cloth backing causes redness and itching   Statins    Muscle weakness   Stiolto Respimat [tiotropium Bromide-olodaterol]    Zetia [ezetimibe]    Leg pain, upset  stomach   Sulfa Antibiotics Rash        Medication List     STOP taking these medications    acetaminophen 650 MG CR tablet Commonly known as: TYLENOL Replaced by: acetaminophen 325 MG tablet   famotidine 10 MG tablet Commonly known as: PEPCID   predniSONE 10 MG (21) Tbpk tablet Commonly known as: STERAPRED UNI-PAK 21 TAB Replaced by: predniSONE 20 MG tablet   zoledronic acid 4 MG/5ML injection Commonly known as: ZOMETA       TAKE these medications    acetaminophen 325 MG tablet Commonly known as: TYLENOL Take 2 tablets (650 mg total) by mouth every 4 (four) hours as needed for mild pain (pain score 1-3), fever or headache (or Fever >/= 101). Replaces: acetaminophen 650 MG CR tablet   albuterol (2.5 MG/3ML) 0.083% nebulizer solution Commonly known as: PROVENTIL Take 3 mLs (2.5 mg total) by nebulization 4 (four) times daily as needed for wheezing or shortness of breath.   albuterol 108 (90 Base) MCG/ACT inhaler Commonly known as: VENTOLIN HFA Inhale 2 puffs into the lungs every 6 (six) hours as needed for wheezing or shortness of breath.   amLODipine 5 MG tablet Commonly known as: NORVASC Take 5 mg by mouth daily.   aspirin EC 81 MG tablet Take 1 tablet (81 mg total) by mouth daily with breakfast. Swallow whole. What changed: when to take this   azithromycin 500 MG tablet Commonly known as: ZITHROMAX Take 1 tablet (500 mg total) by mouth daily for 3 days.   BIOFREEZE EX Apply 1 application topically daily as needed (back pain).   Biotin 1000 MCG tablet Take 1,000 mcg by mouth daily.   cholecalciferol 25 MCG (1000 UNIT) tablet Commonly known as: VITAMIN D3 Take 1,000 Units by mouth daily.   clobetasol 0.05 % external solution Commonly known as: TEMOVATE Apply 1 Application topically daily as needed (Dermatitis).   cyanocobalamin 1000 MCG/ML injection Commonly known as: VITAMIN B12 INJECT 1 ML (1,000 MCG TOTAL) INTO THE MUSCLE EVERY 30 (THIRTY)  DAYS.   fluticasone-salmeterol 100-50 MCG/ACT Aepb Commonly known as:  ADVAIR Inhale 1 puff into the lungs daily.   furosemide 40 MG tablet Commonly known as: Lasix Take 1 tablet (40 mg total) by mouth in the morning. Take for up to 5 days at a time as needed for fluid buildup   guaiFENesin-dextromethorphan 100-10 MG/5ML syrup Commonly known as: ROBITUSSIN DM Take 10 mLs by mouth every 8 (eight) hours as needed for cough.   hydrocortisone 2.5 % lotion Apply 1 application topically daily as needed (when washing face).   letrozole 2.5 MG tablet Commonly known as: FEMARA TAKE 1 TABLET EVERY DAY   losartan 100 MG tablet Commonly known as: COZAAR TAKE 1 TABLET EVERY DAY   MELATONIN PO Take 1 tablet by mouth at bedtime as needed (Sleep).   mometasone 0.1 % cream Commonly known as: ELOCON Apply 1 application topically daily as needed (ear irritation).   oxyCODONE 5 MG immediate release tablet Commonly known as: Oxy IR/ROXICODONE Take 1 tablet (5 mg total) by mouth every 6 (six) hours as needed for moderate pain (pain score 4-6) or severe pain (pain score 7-10) (back pain).   Ozempic (0.25 or 0.5 MG/DOSE) 2 MG/1.5ML Sopn Generic drug: Semaglutide(0.25 or 0.5MG /DOS) Inject 0.25 mg into the skin every 7 (seven) days.   pantoprazole 40 MG tablet Commonly known as: Protonix Take 1 tablet (40 mg total) by mouth daily. What changed:  when to take this reasons to take this   predniSONE 20 MG tablet Commonly known as: DELTASONE Take 2 tablets (40 mg total) by mouth daily with breakfast for 5 days. Replaces: predniSONE 10 MG (21) Tbpk tablet   propranolol 10 MG tablet Commonly known as: INDERAL Take 10-20 mg by mouth 3 (three) times daily as needed (Anxiety).   rosuvastatin 5 MG tablet Commonly known as: CRESTOR Take 1 tablet (5 mg total) by mouth daily.   Systane Complete 0.6 % Soln Generic drug: Propylene Glycol Apply 1 Application to eye as needed (Dry eye).    venlafaxine XR 75 MG 24 hr capsule Commonly known as: EFFEXOR-XR TAKE 3 CAPSULES (225 MG TOTAL) BY MOUTH DAILY WITH BREAKFAST. What changed:  how much to take when to take this        Major procedures and Radiology Reports - PLEASE review detailed and final reports for all details, in brief -   CT Angio Chest PE W/Cm &/Or Wo Cm Result Date: 04/23/2023 CLINICAL DATA:  Shortness of breath onset yesterday worsening today. Prior history of breast cancer 2022. Admitted for pneumonia last December. EXAM: CT ANGIOGRAPHY CHEST WITH CONTRAST TECHNIQUE: Multidetector CT imaging of the chest was performed using the standard protocol during bolus administration of intravenous contrast. Multiplanar CT image reconstructions and MIPs were obtained to evaluate the vascular anatomy. RADIATION DOSE REDUCTION: This exam was performed according to the departmental dose-optimization program which includes automated exposure control, adjustment of the mA and/or kV according to patient size and/or use of iterative reconstruction technique. CONTRAST:  75mL OMNIPAQUE IOHEXOL 350 MG/ML SOLN COMPARISON:  PA Lat chest today, PA la old t chest 03/04/2023, CTA chest 01/12/2023, CTA chest 01/19/2021. FINDINGS: Cardiovascular: The cardiac size is normal. There is lipomatous hypertrophy the interatrial septum. There are patchy three-vessel coronary calcific plaques. No pericardial effusion or venous distention. The aorta is tortuous with scattered calcified plaques in the aorta and great vessels. There is no aneurysm, stenosis or dissection. The pulmonary arteries are normal caliber and do not show evidence of arterial embolism. Mediastinum/Nodes: No axillary or intrathoracic adenopathy or thyroid nodule. There are  left axillary surgical clips. Unremarkable thoracic trachea, thoracic esophagus. Lungs/Pleura: There is mild diffuse bronchial thickening. Increased linear atelectasis right lower lobe. Few linear scar-like opacities both  lung bases. There is no consolidation, effusion or pneumothorax, or appreciable lung nodules. Upper Abdomen: No acute abnormality. Abdominal aortic atherosclerosis. Mild hepatic steatosis. Musculoskeletal: Multilevel thoracic spine bridging enthesopathy. Chronic left paracentral calcified disc extrusion is again noted T6-7 level displacing the thoracic cord posteriorly. No acute or other significant osseous findings. Postoperative changes again noted left breast, mild asymmetric thin skin thickening chronically seen as well as an overlying external breast prosthesis. Review of the MIP images confirms the above findings. IMPRESSION: 1. No evidence of arterial embolism or dilatation. 2. Aortic and coronary artery atherosclerosis. 3. Mild diffuse bronchial thickening. No focal pneumonia. Increased linear atelectasis right lower lobe. 4. Hepatic steatosis. 5. 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 6. Chronic postoperative changes left breast and axilla. No chest wall or axillary mass is seen. Electronically Signed   By: Almira Bar M.D.   On: 04/23/2023 22:03   DG Chest 2 View Result Date: 04/23/2023 CLINICAL DATA:  Short of breath, difficulty breathing EXAM: CHEST - 2 VIEW COMPARISON:  03/04/2023 FINDINGS: Frontal and lateral views of the chest demonstrate an unremarkable cardiac silhouette. No acute airspace disease, effusion, or pneumothorax. No acute bony abnormalities. IMPRESSION: 1. No acute intrathoracic process. Electronically Signed   By: Sharlet Salina M.D.   On: 04/23/2023 20:01   Today   Subjective    Maydell Knoebel today has no new complaints -Cough and dyspnea has improved -No hypoxia No fever  Or chills       Patient has been seen and examined prior to discharge   Objective   Blood pressure (!) 146/73, pulse 95, temperature 97.6 F (36.4 C), temperature source Oral, resp. rate (!) 22, height 5\' 3"  (1.6 m), weight 118.2 kg, SpO2  95%.   Intake/Output Summary (Last 24 hours) at 04/25/2023 1427 Last data filed at 04/25/2023 0548 Gross per 24 hour  Intake 360 ml  Output --  Net 360 ml    Exam Gen:- Awake Alert, no acute distress , morbidly obese, No conversational dyspnea at rest HEENT:- [place].AT, No sclera icterus Neck-Supple Neck,No JVD,.  Lungs-improved air movement, no wheezing CV- S1, S2 normal, regular Abd-  +ve B.Sounds, Abd Soft, No tenderness, increased adiposity Extremity/Skin:- No  edema,   good pulses Psych-affect is appropriate, oriented x3 Neuro-no new focal deficits, no tremors    Data Review   CBC w Diff:  Lab Results  Component Value Date   WBC 10.5 04/25/2023   HGB 10.6 (L) 04/25/2023   HGB 11.3 (L) 10/22/2022   HGB 11.4 01/30/2021   HCT 34.5 (L) 04/25/2023   HCT 35.4 01/30/2021   PLT 261 04/25/2023   PLT 241 10/22/2022   PLT 278 01/30/2021   LYMPHOPCT 20 04/23/2023   MONOPCT 14 04/23/2023   EOSPCT 2 04/23/2023   BASOPCT 1 04/23/2023    CMP:  Lab Results  Component Value Date   NA 139 04/25/2023   NA 143 01/30/2021   K 4.6 04/25/2023   CL 107 04/25/2023   CO2 24 04/25/2023   BUN 22 04/25/2023   BUN 23 01/30/2021   CREATININE 0.89 04/25/2023   CREATININE 0.80 10/22/2022   GLU 88 01/02/2014   PROT 6.6 04/24/2023   PROT 6.8 02/23/2020   ALBUMIN 3.5 04/24/2023   ALBUMIN 4.5 02/23/2020   BILITOT 0.4 04/24/2023  BILITOT 0.3 10/22/2022   ALKPHOS 98 04/24/2023   AST 23 04/24/2023   AST 18 10/22/2022   ALT 20 04/24/2023   ALT 24 10/22/2022  .  Total Discharge time is about 33 minutes  Shon Hale M.D on 04/25/2023 at 2:27 PM  Go to [email or web address] -  for contact info  [facility] - Office  [phone]

## 2023-04-25 NOTE — Discharge Instructions (Signed)
 1)Very Low-salt diet advised---Less than 2 gm of Sodium per day advised----ok to use Mrs DASH salt substitute instead of Salt 2)Weigh yourself daily, call if you gain more than 3 pounds in 1 day or more than 5 pounds in 1 week as your diuretic medications may need to be adjusted 3)Please Note that there has been changes to your medications

## 2023-05-03 ENCOUNTER — Emergency Department (HOSPITAL_COMMUNITY)

## 2023-05-03 ENCOUNTER — Other Ambulatory Visit: Payer: Self-pay

## 2023-05-03 ENCOUNTER — Encounter (HOSPITAL_COMMUNITY): Payer: Self-pay

## 2023-05-03 ENCOUNTER — Inpatient Hospital Stay (HOSPITAL_COMMUNITY)
Admission: EM | Admit: 2023-05-03 | Discharge: 2023-05-09 | DRG: 190 | Disposition: A | Discharge status: Home / Self Care | Attending: Internal Medicine | Admitting: Internal Medicine

## 2023-05-03 DIAGNOSIS — Z882 Allergy status to sulfonamides status: Secondary | ICD-10-CM

## 2023-05-03 DIAGNOSIS — Z91018 Allergy to other foods: Secondary | ICD-10-CM | POA: Diagnosis not present

## 2023-05-03 DIAGNOSIS — Z9221 Personal history of antineoplastic chemotherapy: Secondary | ICD-10-CM

## 2023-05-03 DIAGNOSIS — Z1152 Encounter for screening for COVID-19: Secondary | ICD-10-CM | POA: Diagnosis not present

## 2023-05-03 DIAGNOSIS — E785 Hyperlipidemia, unspecified: Secondary | ICD-10-CM

## 2023-05-03 DIAGNOSIS — J9601 Acute respiratory failure with hypoxia: Secondary | ICD-10-CM | POA: Diagnosis present

## 2023-05-03 DIAGNOSIS — J439 Emphysema, unspecified: Secondary | ICD-10-CM | POA: Diagnosis present

## 2023-05-03 DIAGNOSIS — R0602 Shortness of breath: Secondary | ICD-10-CM | POA: Diagnosis present

## 2023-05-03 DIAGNOSIS — Z8249 Family history of ischemic heart disease and other diseases of the circulatory system: Secondary | ICD-10-CM | POA: Diagnosis not present

## 2023-05-03 DIAGNOSIS — Z7982 Long term (current) use of aspirin: Secondary | ICD-10-CM | POA: Diagnosis not present

## 2023-05-03 DIAGNOSIS — Z853 Personal history of malignant neoplasm of breast: Secondary | ICD-10-CM | POA: Diagnosis not present

## 2023-05-03 DIAGNOSIS — E7800 Pure hypercholesterolemia, unspecified: Secondary | ICD-10-CM | POA: Diagnosis present

## 2023-05-03 DIAGNOSIS — Z87891 Personal history of nicotine dependence: Secondary | ICD-10-CM | POA: Diagnosis not present

## 2023-05-03 DIAGNOSIS — Z923 Personal history of irradiation: Secondary | ICD-10-CM

## 2023-05-03 DIAGNOSIS — F32A Depression, unspecified: Secondary | ICD-10-CM | POA: Insufficient documentation

## 2023-05-03 DIAGNOSIS — Z6841 Body Mass Index (BMI) 40.0 and over, adult: Secondary | ICD-10-CM | POA: Diagnosis not present

## 2023-05-03 DIAGNOSIS — Z66 Do not resuscitate: Secondary | ICD-10-CM | POA: Diagnosis present

## 2023-05-03 DIAGNOSIS — Z825 Family history of asthma and other chronic lower respiratory diseases: Secondary | ICD-10-CM

## 2023-05-03 DIAGNOSIS — E66813 Obesity, class 3: Secondary | ICD-10-CM | POA: Diagnosis present

## 2023-05-03 DIAGNOSIS — I10 Essential (primary) hypertension: Secondary | ICD-10-CM

## 2023-05-03 DIAGNOSIS — B9789 Other viral agents as the cause of diseases classified elsewhere: Secondary | ICD-10-CM | POA: Diagnosis present

## 2023-05-03 DIAGNOSIS — Z79899 Other long term (current) drug therapy: Secondary | ICD-10-CM

## 2023-05-03 DIAGNOSIS — B9710 Unspecified enterovirus as the cause of diseases classified elsewhere: Secondary | ICD-10-CM | POA: Diagnosis present

## 2023-05-03 DIAGNOSIS — Z85828 Personal history of other malignant neoplasm of skin: Secondary | ICD-10-CM

## 2023-05-03 DIAGNOSIS — J441 Chronic obstructive pulmonary disease with (acute) exacerbation: Secondary | ICD-10-CM | POA: Diagnosis present

## 2023-05-03 DIAGNOSIS — K219 Gastro-esophageal reflux disease without esophagitis: Secondary | ICD-10-CM | POA: Insufficient documentation

## 2023-05-03 DIAGNOSIS — Z800 Family history of malignant neoplasm of digestive organs: Secondary | ICD-10-CM

## 2023-05-03 DIAGNOSIS — F419 Anxiety disorder, unspecified: Secondary | ICD-10-CM | POA: Diagnosis present

## 2023-05-03 DIAGNOSIS — Z7951 Long term (current) use of inhaled steroids: Secondary | ICD-10-CM | POA: Diagnosis not present

## 2023-05-03 DIAGNOSIS — Z803 Family history of malignant neoplasm of breast: Secondary | ICD-10-CM

## 2023-05-03 DIAGNOSIS — Z888 Allergy status to other drugs, medicaments and biological substances status: Secondary | ICD-10-CM | POA: Diagnosis not present

## 2023-05-03 DIAGNOSIS — Z808 Family history of malignant neoplasm of other organs or systems: Secondary | ICD-10-CM

## 2023-05-03 DIAGNOSIS — Z79811 Long term (current) use of aromatase inhibitors: Secondary | ICD-10-CM

## 2023-05-03 LAB — BASIC METABOLIC PANEL WITH GFR
Anion gap: 10 (ref 5–15)
Anion gap: 12 (ref 5–15)
BUN: 27 mg/dL — ABNORMAL HIGH (ref 8–23)
BUN: 27 mg/dL — ABNORMAL HIGH (ref 8–23)
CO2: 28 mmol/L (ref 22–32)
CO2: 25 mmol/L (ref 22–32)
Calcium: 8.4 mg/dL — ABNORMAL LOW (ref 8.9–10.3)
Calcium: 8.5 mg/dL — ABNORMAL LOW (ref 8.9–10.3)
Chloride: 100 mmol/L (ref 98–111)
Chloride: 100 mmol/L (ref 98–111)
Creatinine, Ser: 1.02 mg/dL — ABNORMAL HIGH (ref 0.44–1.00)
Creatinine, Ser: 0.96 mg/dL (ref 0.44–1.00)
GFR, Estimated: 58 mL/min — ABNORMAL LOW (ref >60)
GFR, Estimated: >60 mL/min (ref >60)
Glucose, Bld: 108 mg/dL — ABNORMAL HIGH (ref 70–99)
Glucose, Bld: 127 mg/dL — ABNORMAL HIGH (ref 70–99)
Potassium: 4.2 mmol/L (ref 3.5–5.1)
Potassium: 4.3 mmol/L (ref 3.5–5.1)
Sodium: 138 mmol/L (ref 135–145)
Sodium: 137 mmol/L (ref 135–145)

## 2023-05-03 LAB — CBC WITH DIFFERENTIAL/PLATELET
Abs Immature Granulocytes: 0.12 10*3/uL — ABNORMAL HIGH (ref 0.00–0.07)
Basophils Absolute: 0.1 10*3/uL (ref 0.0–0.1)
Basophils Relative: 0 %
Eosinophils Absolute: 0.4 10*3/uL (ref 0.0–0.5)
Eosinophils Relative: 3 %
HCT: 35 % — ABNORMAL LOW (ref 36.0–46.0)
Hemoglobin: 10.8 g/dL — ABNORMAL LOW (ref 12.0–15.0)
Immature Granulocytes: 1 %
Lymphocytes Relative: 9 %
Lymphs Abs: 1.1 10*3/uL (ref 0.7–4.0)
MCH: 27.8 pg (ref 26.0–34.0)
MCHC: 30.9 g/dL (ref 30.0–36.0)
MCV: 90 fL (ref 80.0–100.0)
Monocytes Absolute: 1.5 10*3/uL — ABNORMAL HIGH (ref 0.1–1.0)
Monocytes Relative: 13 %
Neutro Abs: 8.7 10*3/uL — ABNORMAL HIGH (ref 1.7–7.7)
Neutrophils Relative %: 74 %
Platelets: 231 10*3/uL (ref 150–400)
RBC: 3.89 MIL/uL (ref 3.87–5.11)
RDW: 16.4 % — ABNORMAL HIGH (ref 11.5–15.5)
WBC: 11.8 10*3/uL — ABNORMAL HIGH (ref 4.0–10.5)
nRBC: 0 % (ref 0.0–0.2)

## 2023-05-03 LAB — CBC
HCT: 36.8 % (ref 36.0–46.0)
Hemoglobin: 11.4 g/dL — ABNORMAL LOW (ref 12.0–15.0)
MCH: 27.8 pg (ref 26.0–34.0)
MCHC: 31 g/dL (ref 30.0–36.0)
MCV: 89.8 fL (ref 80.0–100.0)
Platelets: 231 10*3/uL (ref 150–400)
RBC: 4.1 MIL/uL (ref 3.87–5.11)
RDW: 16.3 % — ABNORMAL HIGH (ref 11.5–15.5)
WBC: 12.1 10*3/uL — ABNORMAL HIGH (ref 4.0–10.5)
nRBC: 0 % (ref 0.0–0.2)

## 2023-05-03 LAB — RESP PANEL BY RT-PCR (RSV, FLU A&B, COVID)  RVPGX2
Influenza A by PCR: NEGATIVE
Influenza B by PCR: NEGATIVE
Resp Syncytial Virus by PCR: NEGATIVE
SARS Coronavirus 2 by RT PCR: NEGATIVE

## 2023-05-03 LAB — TROPONIN I (HIGH SENSITIVITY)
Troponin I (High Sensitivity): 12 ng/L (ref <18)
Troponin I (High Sensitivity): 12 ng/L (ref <18)

## 2023-05-03 LAB — BRAIN NATRIURETIC PEPTIDE: B Natriuretic Peptide: 40 pg/mL (ref 0.0–100.0)

## 2023-05-03 MED ORDER — SODIUM CHLORIDE 0.9 % IV SOLN
1.0000 g | INTRAVENOUS | Status: DC
  Administered 2023-05-03: 1 g via INTRAVENOUS
  Filled 2023-05-03: qty 10

## 2023-05-03 MED ORDER — LOSARTAN POTASSIUM 50 MG PO TABS
100.0000 mg | ORAL_TABLET | Freq: Every day | ORAL | Status: DC
  Administered 2023-05-03 – 2023-05-09 (×7): 100 mg via ORAL
  Filled 2023-05-03 (×7): qty 2

## 2023-05-03 MED ORDER — GUAIFENESIN ER 600 MG PO TB12
600.0000 mg | ORAL_TABLET | Freq: Two times a day (BID) | ORAL | Status: DC
  Administered 2023-05-03 (×3): 600 mg via ORAL
  Filled 2023-05-03 (×4): qty 1

## 2023-05-03 MED ORDER — IPRATROPIUM-ALBUTEROL 0.5-2.5 (3) MG/3ML IN SOLN
3.0000 mL | Freq: Once | RESPIRATORY_TRACT | Status: AC
  Administered 2023-05-03: 3 mL via RESPIRATORY_TRACT
  Filled 2023-05-03: qty 3

## 2023-05-03 MED ORDER — ACETAMINOPHEN 650 MG RE SUPP: 650.0000 mg | Freq: Four times a day (QID) | RECTAL | Status: DC | PRN

## 2023-05-03 MED ORDER — METHYLPREDNISOLONE SODIUM SUCC 125 MG IJ SOLR
125.0000 mg | Freq: Once | INTRAMUSCULAR | Status: AC
  Administered 2023-05-03: 125 mg via INTRAVENOUS
  Filled 2023-05-03: qty 2

## 2023-05-03 MED ORDER — ROSUVASTATIN CALCIUM 10 MG PO TABS
5.0000 mg | ORAL_TABLET | Freq: Every day | ORAL | Status: DC
  Administered 2023-05-03 – 2023-05-09 (×7): 5 mg via ORAL
  Filled 2023-05-03 (×7): qty 1

## 2023-05-03 MED ORDER — BIOTIN 1000 MCG PO TABS: 1000.0000 ug | ORAL_TABLET | Freq: Every day | ORAL | Status: DC

## 2023-05-03 MED ORDER — IPRATROPIUM-ALBUTEROL 0.5-2.5 (3) MG/3ML IN SOLN
3.0000 mL | Freq: Four times a day (QID) | RESPIRATORY_TRACT | Status: DC
  Administered 2023-05-03 – 2023-05-04 (×5): 3 mL via RESPIRATORY_TRACT
  Filled 2023-05-03 (×5): qty 3

## 2023-05-03 MED ORDER — FUROSEMIDE 40 MG PO TABS
40.0000 mg | ORAL_TABLET | Freq: Every day | ORAL | Status: DC
Start: 2023-05-03 — End: 2023-05-07
  Administered 2023-05-03 – 2023-05-07 (×5): 40 mg via ORAL
  Filled 2023-05-03 (×5): qty 1

## 2023-05-03 MED ORDER — METHYLPREDNISOLONE SODIUM SUCC 125 MG IJ SOLR
60.0000 mg | Freq: Two times a day (BID) | INTRAMUSCULAR | Status: DC
Start: 2023-05-03 — End: 2023-05-09
  Administered 2023-05-03 – 2023-05-08 (×11): 60 mg via INTRAVENOUS
  Filled 2023-05-03 (×12): qty 2

## 2023-05-03 MED ORDER — HYDROCOD POLI-CHLORPHE POLI ER 10-8 MG/5ML PO SUER
5.0000 mL | Freq: Two times a day (BID) | ORAL | Status: DC | PRN
  Administered 2023-05-04 – 2023-05-08 (×6): 5 mL via ORAL
  Filled 2023-05-03 (×6): qty 5

## 2023-05-03 MED ORDER — MAGNESIUM HYDROXIDE 400 MG/5ML PO SUSP: 30.0000 mL | Freq: Every day | ORAL | Status: DC | PRN

## 2023-05-03 MED ORDER — ENOXAPARIN SODIUM 40 MG/0.4ML IJ SOSY
40.0000 mg | PREFILLED_SYRINGE | INTRAMUSCULAR | Status: DC
Start: 2023-05-03 — End: 2023-05-06
  Administered 2023-05-03 – 2023-05-05 (×3): 40 mg via SUBCUTANEOUS
  Filled 2023-05-03 (×4): qty 0.4

## 2023-05-03 MED ORDER — IPRATROPIUM-ALBUTEROL 0.5-2.5 (3) MG/3ML IN SOLN: 3.0000 mL | RESPIRATORY_TRACT | Status: DC | PRN

## 2023-05-03 MED ORDER — AMLODIPINE BESYLATE 5 MG PO TABS
5.0000 mg | ORAL_TABLET | Freq: Every day | ORAL | Status: DC
  Administered 2023-05-03 – 2023-05-09 (×7): 5 mg via ORAL
  Filled 2023-05-03 (×7): qty 1

## 2023-05-03 MED ORDER — MAGNESIUM SULFATE 2 GM/50ML IV SOLN
2.0000 g | Freq: Once | INTRAVENOUS | Status: AC
  Administered 2023-05-03: 2 g via INTRAVENOUS
  Filled 2023-05-03: qty 50

## 2023-05-03 MED ORDER — SODIUM CHLORIDE 0.9 % IV SOLN
2.0000 g | INTRAVENOUS | Status: AC
  Administered 2023-05-04 – 2023-05-07 (×4): 2 g via INTRAVENOUS
  Filled 2023-05-03 (×4): qty 20

## 2023-05-03 MED ORDER — ONDANSETRON HCL 4 MG/2ML IJ SOLN: 4.0000 mg | Freq: Four times a day (QID) | INTRAMUSCULAR | Status: DC | PRN

## 2023-05-03 MED ORDER — LETROZOLE 2.5 MG PO TABS
2.5000 mg | ORAL_TABLET | Freq: Every day | ORAL | Status: DC
  Administered 2023-05-03 – 2023-05-09 (×7): 2.5 mg via ORAL
  Filled 2023-05-03 (×8): qty 1

## 2023-05-03 MED ORDER — PROPRANOLOL HCL 20 MG PO TABS
10.0000 mg | ORAL_TABLET | Freq: Three times a day (TID) | ORAL | Status: DC | PRN
  Administered 2023-05-08: 20 mg via ORAL
  Filled 2023-05-03: qty 1

## 2023-05-03 MED ORDER — METHYLPREDNISOLONE SODIUM SUCC 40 MG IJ SOLR
40.0000 mg | Freq: Two times a day (BID) | INTRAMUSCULAR | Status: DC
  Administered 2023-05-03: 40 mg via INTRAVENOUS
  Filled 2023-05-03: qty 1

## 2023-05-03 MED ORDER — PANTOPRAZOLE SODIUM 40 MG PO TBEC: 40.0000 mg | DELAYED_RELEASE_TABLET | Freq: Every day | ORAL | Status: DC | PRN

## 2023-05-03 MED ORDER — ONDANSETRON HCL 4 MG PO TABS: 4.0000 mg | ORAL_TABLET | Freq: Four times a day (QID) | ORAL | Status: DC | PRN

## 2023-05-03 MED ORDER — OXYCODONE HCL 5 MG PO TABS: 5.0000 mg | ORAL_TABLET | Freq: Four times a day (QID) | ORAL | Status: DC | PRN

## 2023-05-03 MED ORDER — ASPIRIN 81 MG PO TBEC
81.0000 mg | DELAYED_RELEASE_TABLET | Freq: Every day | ORAL | Status: DC
  Administered 2023-05-03 – 2023-05-09 (×7): 81 mg via ORAL
  Filled 2023-05-03 (×7): qty 1

## 2023-05-03 MED ORDER — VITAMIN D 25 MCG (1000 UNIT) PO TABS
1000.0000 [IU] | ORAL_TABLET | Freq: Every day | ORAL | Status: DC
  Administered 2023-05-03 – 2023-05-09 (×8): 1000 [IU] via ORAL
  Filled 2023-05-03 (×7): qty 1

## 2023-05-03 MED ORDER — ALBUTEROL SULFATE HFA 108 (90 BASE) MCG/ACT IN AERS: 2.0000 | INHALATION_SPRAY | RESPIRATORY_TRACT | Status: DC | PRN

## 2023-05-03 MED ORDER — PREDNISONE 20 MG PO TABS: 40.0000 mg | ORAL_TABLET | Freq: Every day | ORAL | Status: DC

## 2023-05-03 MED ORDER — PANTOPRAZOLE SODIUM 40 MG PO TBEC
40.0000 mg | DELAYED_RELEASE_TABLET | Freq: Every evening | ORAL | Status: DC
  Administered 2023-05-03 – 2023-05-08 (×6): 40 mg via ORAL
  Filled 2023-05-03 (×6): qty 1

## 2023-05-03 MED ORDER — ACETAMINOPHEN 325 MG PO TABS: 650.0000 mg | ORAL_TABLET | Freq: Four times a day (QID) | ORAL | Status: DC | PRN

## 2023-05-03 MED ORDER — VENLAFAXINE HCL ER 75 MG PO CP24
150.0000 mg | ORAL_CAPSULE | Freq: Every day | ORAL | Status: DC
  Administered 2023-05-03 – 2023-05-09 (×7): 150 mg via ORAL
  Filled 2023-05-03 (×7): qty 2

## 2023-05-03 MED ORDER — TRAZODONE HCL 50 MG PO TABS
25.0000 mg | ORAL_TABLET | Freq: Every evening | ORAL | Status: DC | PRN
  Administered 2023-05-06: 25 mg via ORAL
  Filled 2023-05-03: qty 1

## 2023-05-03 MED ORDER — SODIUM CHLORIDE 0.9 % IV SOLN: INTRAVENOUS | Status: DC

## 2023-05-03 NOTE — Assessment & Plan Note (Signed)
 -  The patient will be admitted to a medically monitored bed. - We will place the patient IV steroid therapy with IV Solu-Medrol as well as nebulized bronchodilator therapy with duonebs q.i.d. and q.4 hours p.r.n.Marland Kitchen - Mucolytic therapy will be provided with Mucinex and antibiotic therapy with IV Rocephin. - O2 protocol will be followed.

## 2023-05-03 NOTE — ED Triage Notes (Signed)
 Pt from home with complaints of SOB. Just finished antibiotics for bronchitis. No use of o2 at home. Pt given duoneb on EMS. Productive cough present during triage. Pt on 3L during triage at 92%. Difficulty speaking noted.

## 2023-05-03 NOTE — Plan of Care (Signed)
   Problem: Activity: Goal: Risk for activity intolerance will decrease Outcome: Progressing   Problem: Coping: Goal: Level of anxiety will decrease Outcome: Progressing

## 2023-05-03 NOTE — Assessment & Plan Note (Signed)
-   We will continue antihypertensive therapy.

## 2023-05-03 NOTE — Progress Notes (Signed)
 ASSUMPTION OF CARE NOTE   05/03/2023 12:15 PM  Victoria Dean was seen and examined.  The H&P by the admitting provider, orders, imaging was reviewed.  Please see new orders.  Will continue to follow.   Vitals:   05/03/23 0801 05/03/23 1114  BP: (!) 171/71   Pulse:    Resp:    Temp:    SpO2: 96% 91%    Results for orders placed or performed during the hospital encounter of 05/03/23  Basic metabolic panel   Collection Time: 05/03/23 12:52 AM  Result Value Ref Range   Sodium 138 135 - 145 mmol/L   Potassium 4.2 3.5 - 5.1 mmol/L   Chloride 100 98 - 111 mmol/L   CO2 28 22 - 32 mmol/L   Glucose, Bld 108 (H) 70 - 99 mg/dL   BUN 27 (H) 8 - 23 mg/dL   Creatinine, Ser 9.14 (H) 0.44 - 1.00 mg/dL   Calcium 8.4 (L) 8.9 - 10.3 mg/dL   GFR, Estimated 58 (L) >60 mL/min   Anion gap 10 5 - 15  CBC with Differential   Collection Time: 05/03/23 12:52 AM  Result Value Ref Range   WBC 11.8 (H) 4.0 - 10.5 K/uL   RBC 3.89 3.87 - 5.11 MIL/uL   Hemoglobin 10.8 (L) 12.0 - 15.0 g/dL   HCT 78.2 (L) 95.6 - 21.3 %   MCV 90.0 80.0 - 100.0 fL   MCH 27.8 26.0 - 34.0 pg   MCHC 30.9 30.0 - 36.0 g/dL   RDW 08.6 (H) 57.8 - 46.9 %   Platelets 231 150 - 400 K/uL   nRBC 0.0 0.0 - 0.2 %   Neutrophils Relative % 74 %   Neutro Abs 8.7 (H) 1.7 - 7.7 K/uL   Lymphocytes Relative 9 %   Lymphs Abs 1.1 0.7 - 4.0 K/uL   Monocytes Relative 13 %   Monocytes Absolute 1.5 (H) 0.1 - 1.0 K/uL   Eosinophils Relative 3 %   Eosinophils Absolute 0.4 0.0 - 0.5 K/uL   Basophils Relative 0 %   Basophils Absolute 0.1 0.0 - 0.1 K/uL   Immature Granulocytes 1 %   Abs Immature Granulocytes 0.12 (H) 0.00 - 0.07 K/uL  Brain natriuretic peptide   Collection Time: 05/03/23 12:52 AM  Result Value Ref Range   B Natriuretic Peptide 40.0 0.0 - 100.0 pg/mL  Troponin I (High Sensitivity)   Collection Time: 05/03/23 12:52 AM  Result Value Ref Range   Troponin I (High Sensitivity) 12 <18 ng/L  Resp panel by RT-PCR (RSV, Flu A&B,  Covid) Anterior Nasal Swab   Collection Time: 05/03/23  2:10 AM   Specimen: Anterior Nasal Swab  Result Value Ref Range   SARS Coronavirus 2 by RT PCR NEGATIVE NEGATIVE   Influenza A by PCR NEGATIVE NEGATIVE   Influenza B by PCR NEGATIVE NEGATIVE   Resp Syncytial Virus by PCR NEGATIVE NEGATIVE  Troponin I (High Sensitivity)   Collection Time: 05/03/23  3:13 AM  Result Value Ref Range   Troponin I (High Sensitivity) 12 <18 ng/L  Basic metabolic panel   Collection Time: 05/03/23  4:17 AM  Result Value Ref Range   Sodium 137 135 - 145 mmol/L   Potassium 4.3 3.5 - 5.1 mmol/L   Chloride 100 98 - 111 mmol/L   CO2 25 22 - 32 mmol/L   Glucose, Bld 127 (H) 70 - 99 mg/dL   BUN 27 (H) 8 - 23 mg/dL   Creatinine, Ser 6.29 0.44 -  1.00 mg/dL   Calcium 8.5 (L) 8.9 - 10.3 mg/dL   GFR, Estimated >78 >29 mL/min   Anion gap 12 5 - 15  CBC   Collection Time: 05/03/23  4:17 AM  Result Value Ref Range   WBC 12.1 (H) 4.0 - 10.5 K/uL   RBC 4.10 3.87 - 5.11 MIL/uL   Hemoglobin 11.4 (L) 12.0 - 15.0 g/dL   HCT 56.2 13.0 - 86.5 %   MCV 89.8 80.0 - 100.0 fL   MCH 27.8 26.0 - 34.0 pg   MCHC 31.0 30.0 - 36.0 g/dL   RDW 78.4 (H) 69.6 - 29.5 %   Platelets 231 150 - 400 K/uL   nRBC 0.0 0.0 - 0.2 %     Maryln Manuel, MD [facility]   05/03/2023 12:07 AM How to contact the [facility] Attending or Consulting provider 7A - 7P or covering provider during after hours 7P -7A, for this patient?  Check the care team in [facility] and look for a) attending/consulting [facility] provider listed and b) the [facility] team listed Log into [email or web address] and use [place] universal password to access. If you do not have the password, please contact the hospital operator. Locate the [facility] provider you are looking for under [facility] and page to a number that you can be directly reached. If you still have difficulty reaching the provider, please page the [facility] (Director on Call) for the Hospitalists listed on amion for assistance.

## 2023-05-03 NOTE — Plan of Care (Signed)

## 2023-05-03 NOTE — Assessment & Plan Note (Signed)
 -  We will continue statin therapy.

## 2023-05-03 NOTE — Progress Notes (Signed)
 SATURATION QUALIFICATIONS: (This note is used to comply with regulatory documentation for home oxygen)  Patient Saturations on Room Air at Rest = 93%  Patient Saturations on Room Air while Ambulating = 88%  Sob with ambulation and lightheadness. Returned to normal O2 sat 93-94% on RA at rest.

## 2023-05-03 NOTE — Hospital Course (Signed)
 73 y.o. Caucasian female with medical history significant for essential hypertension, COPD, hypertension, and depression, who presented to the emergency room with acute onset of dyspnea with associated dyspnea with associated wheezing as well as mildly productive cough of yellowish sputum that started earlier during the day.  She used her nebulizer with mild relief.  Pulse oximetry was in the upper 70s and therefore EMS was called.  No fever or chills.  No chest pain or palpitations.  No nausea or vomiting or abdominal pain.  No dysuria, oliguria or hematuria or flank pain.    The patient was given 2 g of IV magnesium sulfate, DuoNeb and 125 mg of IV Solu-Medrol.  She will be admitted to a medical telemetry bed for further evaluation and management.

## 2023-05-03 NOTE — ED Provider Notes (Signed)
 [place] [facility]ERGENCY DEPARTMENT AT [facility] Provider Note   CSN: [id number] Arrival date & time: 05/03/23  0005     History  Chief Complaint  Patient presents with   Shortness of Breath    Victoria Dean is a 73 y.o. female.  Patient is a 73 year old female with past medical history of COPD/chronic bronchitis, hypertension.  Patient presenting today for evaluation of shortness of breath.  She began wheezing earlier today and having difficulty breathing.  She has used her nebulizer at home with little relief.  She reports getting oxygen saturations on her pulse ox at home in the upper 70s, so called EMS and was transported here.  She denies any fevers or chills.  She has had some cough that is minimally productive.  No leg swelling or calf pain.  Patient recently admitted with a similar presentation and diagnosed with chronic bronchitis/COPD.       Home Medications Prior to Admission medications   Medication Sig Start Date End Date Taking? Authorizing Provider  acetaminophen (TYLENOL) 325 MG tablet Take 2 tablets (650 mg total) by mouth every 4 (four) hours as needed for mild pain (pain score 1-3), fever or headache (or Fever >/= 101). 04/25/23   Emokpae, Courage, MD  albuterol (PROVENTIL) (2.5 MG/3ML) 0.083% nebulizer solution Take 3 mLs (2.5 mg total) by nebulization 4 (four) times daily as needed for wheezing or shortness of breath. 04/25/23   Mariea Clonts, Courage, MD  albuterol (VENTOLIN HFA) 108 (90 Base) MCG/ACT inhaler Inhale 2 puffs into the lungs every 6 (six) hours as needed for wheezing or shortness of breath. 04/25/23   Emokpae, Courage, MD  amLODipine (NORVASC) 5 MG tablet Take 5 mg by mouth daily. 10/09/22   [provider]  aspirin EC 81 MG tablet Take 1 tablet (81 mg total) by mouth daily with breakfast. Swallow whole. 04/25/23 04/24/24  Shon Hale, MD  Biotin 1000 MCG tablet Take 1,000 mcg by mouth daily.    [provider]  cholecalciferol  (VITAMIN D) 25 MCG (1000 UNIT) tablet Take 1,000 Units by mouth daily.    [provider]  clobetasol (TEMOVATE) 0.05 % external solution Apply 1 Application topically daily as needed (Dermatitis). 12/11/22   [provider]  cyanocobalamin (VITAMIN B12) 1000 MCG/ML injection INJECT 1 ML (1,000 MCG TOTAL) INTO THE MUSCLE EVERY 30 (THIRTY) DAYS. 01/05/23   Creig Hines, MD  fluticasone-salmeterol (ADVAIR) 100-50 MCG/ACT AEPB Inhale 1 puff into the lungs daily. 03/17/23   [provider]  furosemide (LASIX) 40 MG tablet Take 1 tablet (40 mg total) by mouth in the morning. Take for up to 5 days at a time as needed for fluid buildup 04/25/23 04/24/24  Emokpae, Courage, MD  guaiFENesin-dextromethorphan (ROBITUSSIN DM) 100-10 MG/5ML syrup Take 10 mLs by mouth every 8 (eight) hours as needed for cough. 01/21/23   Vassie Loll, MD  hydrocortisone 2.5 % lotion Apply 1 application topically daily as needed (when washing face).    [provider]  letrozole [facility]) 2.5 MG tablet TAKE 1 TABLET EVERY DAY 01/05/23   Creig Hines, MD  losartan (COZAAR) 100 MG tablet TAKE 1 TABLET EVERY DAY 05/10/21   Bosie Clos, MD  MELATONIN PO Take 1 tablet by mouth at bedtime as needed (Sleep).    [provider]  Menthol, Topical Analgesic, (BIOFREEZE EX) Apply 1 application topically daily as needed (back pain).    [provider]  mometasone (ELOCON) 0.1 % cream Apply 1  application topically daily as needed (ear irritation).    [provider]  oxyCODONE (OXY IR/ROXICODONE) 5 MG immediate release tablet Take 1 tablet (5 mg total) by mouth every 6 (six) hours as needed for moderate pain (pain score 4-6) or severe pain (pain score 7-10) (back pain). 04/25/23   Shon Hale, MD  pantoprazole (PROTONIX) 40 MG tablet Take 1 tablet (40 mg total) by mouth daily. Patient taking differently: Take 40 mg by mouth daily as needed (Indigestion). 01/21/23 01/21/24   Vassie Loll, MD  propranolol (INDERAL) 10 MG tablet Take 10-20 mg by mouth 3 (three) times daily as needed (Anxiety). 02/05/23   [provider]  Propylene Glycol (SYSTANE COMPLETE) 0.6 % SOLN Apply 1 Application to eye as needed (Dry eye).    [provider]  rosuvastatin (CRESTOR) 5 MG tablet Take 1 tablet (5 mg total) by mouth daily. 05/22/21   Bosie Clos, MD  Semaglutide,0.25 or 0.5MG /DOS, (OZEMPIC, 0.25 OR 0.5 MG/DOSE,) 2 MG/1.5ML SOPN Inject 0.25 mg into the skin every 7 (seven) days. 07/03/22   [provider]  venlafaxine XR (EFFEXOR-XR) 75 MG 24 hr capsule TAKE 3 CAPSULES (225 MG TOTAL) BY MOUTH DAILY WITH BREAKFAST. Patient taking differently: Take 150 mg by mouth at bedtime. 01/10/21   Bosie Clos, MD      Allergies    Onion, Other, Statins, Stiolto respimat [tiotropium bromide-olodaterol], Zetia [ezetimibe], and Sulfa antibiotics    Review of Systems   Review of Systems  All other systems reviewed and are negative.   Physical Exam Updated Vital Signs BP (!) 113/55   Pulse 90   Temp 98.9 F (37.2 C) (Oral)   Resp 20   Ht 5\' 3"  (1.6 m)   Wt 112.5 kg   SpO2 91%   BMI 43.93 kg/m  Physical Exam Vitals and nursing note reviewed.  Constitutional:      General: She is not in acute distress.    Appearance: She is well-developed. She is not diaphoretic.  HENT:     Head: Normocephalic and atraumatic.  Cardiovascular:     Rate and Rhythm: Normal rate and regular rhythm.     Heart sounds: No murmur heard.    No friction rub. No gallop.  Pulmonary:     Effort: Pulmonary effort is normal. Tachypnea present. No respiratory distress.     Breath sounds: Examination of the right-middle field reveals rhonchi. Examination of the left-middle field reveals rhonchi. Rhonchi present.     Comments: Patient does appear dyspneic.  She is tachypneic and there is wheezing throughout. Abdominal:     General: Bowel sounds are normal. There is no  distension.     Palpations: Abdomen is soft.     Tenderness: There is no abdominal tenderness.  Musculoskeletal:        General: Normal range of motion.     Cervical back: Normal range of motion and neck supple.  Skin:    General: Skin is warm and dry.  Neurological:     General: No focal deficit present.     Mental Status: She is alert and oriented to person, place, and time.     ED Results / Procedures / Treatments   Labs (all labs ordered are listed, but only abnormal results are displayed) Labs Reviewed - No data to display  EKG EKG Interpretation Date/Time:  Sunday May 03 2023 00:13:49 EDT Ventricular Rate:  84 PR Interval:  160 QRS Duration:  82 QT Interval:  355 QTC Calculation:  420 R Axis:   76  Text Interpretation: Sinus rhythm Normal ECG Confirmed by Geoffery Lyons ([id number]) on 05/03/2023 12:18:30 AM  Radiology No results found.  Procedures Procedures    Medications Ordered in ED Medications  albuterol (VENTOLIN HFA) 108 (90 Base) MCG/ACT inhaler 2 puff (has no administration in time range)  methylPREDNISolone sodium succinate (SOLU-MEDROL) 125 mg/2 mL injection 125 mg (has no administration in time range)  magnesium sulfate IVPB 2 g 50 mL (has no administration in time range)  ipratropium-albuterol (DUONEB) 0.5-2.5 (3) MG/3ML nebulizer solution 3 mL (has no administration in time range)    ED Course/ Medical Decision Making/ A&P  Patient is a 73 year old female with history of COPD presenting with wheezing and shortness of breath as described in the HPI.  Patient arrives here with stable vital signs and is afebrile.  She is somewhat hypoxic and is requiring 3 L nasal cannula to maintain saturations in the mid 90s.  She has diffuse expiratory wheezing bilaterally and is in mild respiratory distress.  Laboratory studies obtained including CBC, metabolic panel, BNP, and troponin, all of which are basically unremarkable.  COVID/flu/RSV all negative.  Chest  x-ray showing no active disease.  Patient has received multiple DuoNebs along with IV Solu-Medrol and magnesium, but continues to wheeze and oxygen requirement persists.  I feel as though she will require admission for further management.  I have spoken with Dr. Arville Care who agrees to admit.  CRITICAL CARE Performed by: Geoffery Lyons Total critical care time: 35 minutes Critical care time was exclusive of separately billable procedures and treating other patients. Critical care was necessary to treat or prevent imminent or life-threatening deterioration. Critical care was time spent personally by me on the following activities: development of treatment plan with patient and/or surrogate as well as nursing, discussions with consultants, evaluation of patient's response to treatment, examination of patient, obtaining history from patient or surrogate, ordering and performing treatments and interventions, ordering and review of laboratory studies, ordering and review of radiographic studies, pulse oximetry and re-evaluation of patient's condition.   Final Clinical Impression(s) / ED Diagnoses Final diagnoses:  None    Rx / DC Orders ED Discharge Orders     None         Geoffery Lyons, MD 05/03/23 [phone]

## 2023-05-03 NOTE — Assessment & Plan Note (Signed)
-   We will continue letrozole. 

## 2023-05-03 NOTE — Assessment & Plan Note (Signed)
 -  We will continue PPI therapy

## 2023-05-03 NOTE — Assessment & Plan Note (Signed)
-   O2 protocol will be followed. - Management otherwise as above. 

## 2023-05-03 NOTE — Assessment & Plan Note (Signed)
-   We will continue Effexor XR. 

## 2023-05-03 NOTE — Progress Notes (Signed)
   05/03/23 1411  TOC Assessment  Expected Discharge Plan Home/Self Care  Final next level of care Home/Self Care  Barriers to Discharge No Barriers Identified  Living arrangements for the past 2 months Single Family Home  Lives with: Self  Appearance: Appears stated age  Attitude/Demeanor/Rapport Engaged  Affect (typically observed) Calm  Orientation:  Oriented to Self;Oriented to Place;Oriented to  Time;Oriented to Situation   CM met with patient at bedside. Patient admitted with COPD exacerbation reports she lives alone. Patient is Independent with adl's. Patient does not have home services at this time. Patient drives herself to appointments. We will continue to monitor patient advancement through interdisciplinary progression rounds.

## 2023-05-03 NOTE — H&P (Signed)
 [place]   PATIENT NAME: Victoria Dean    MR#:  [id number]  DATE OF BIRTH:  1950-11-17  DATE OF ADMISSION:  05/03/2023  PRIMARY CARE PHYSICIAN: Oneal Grout, FNP   Patient is coming from: Home  REQUESTING/REFERRING PHYSICIAN: Geoffery Lyons, MD  CHIEF COMPLAINT:   Chief Complaint  Patient presents with   Shortness of Breath    HISTORY OF PRESENT ILLNESS:  DAZIA LIPPOLD is a 73 y.o. Caucasian female with medical history significant for essential hypertension, COPD, hypertension, and depression, who presented to the emergency room with acute onset of dyspnea with associated dyspnea with associated wheezing as well as mildly productive cough of yellowish sputum that started earlier during the day.  She used her nebulizer with mild relief.  Pulse oximetry was in the upper 70s and therefore EMS was called.  No fever or chills.  No chest pain or palpitations.  No nausea or vomiting or abdominal pain.  No dysuria, oliguria or hematuria or flank pain.  ED Course: When the patient came to the ER, BP was 146/63 and pulse continues 97% on 3 L of O2 by nasal cannula with otherwise normal vital signs.  Labs revealed a BUN of 27 with a creatinine of 1.02 with a glucose of 108 and calcium 8.4.  CBC showed hemoglobin of 10.8 and hematocrit 35 close to baseline and leukocytosis of 11.8 with neutrophilia.  Respiratory panel came back negative. EKG as reviewed by me : Sinus rhythm with a rate of 84. Imaging: Portable chest x-ray showed no acute cardiopulmonary disease.  The patient was given 2 g of IV magnesium sulfate, DuoNeb and 125 mg of IV Solu-Medrol.  She will be admitted to a medical telemetry bed for further evaluation and management. 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    Vitamin D deficiency     PAST SURGICAL HISTORY:   Past Surgical History:  Procedure Laterality Date   ABDOMINAL HYSTERECTOMY  1995   APPENDECTOMY  1994   BREAST BIOPSY Right 1998   neg   BREAST BIOPSY Left 05/30/2020   stere bx x clip path pending   BREAST LUMPECTOMY WITH SENTINEL LYMPH NODE BIOPSY Left 06/29/2020   Procedure: BREAST LUMPECTOMY WITH SENTINEL LYMPH NODE BX;  Surgeon: Earline Mayotte, MD;  Location: [facility] ORS;  Service: General;  Laterality: Left;   CATARACT EXTRACTION W/PHACO Left 01/31/2022   Procedure: CATARACT EXTRACTION PHACO AND INTRAOCULAR LENS PLACEMENT (IOC);  Surgeon: Fabio Pierce, MD;  Location: [facility] OR[facility];  Service: Ophthalmology;  Laterality: Left;  CDE 8.10   CATARACT EXTRACTION W/PHACO Right 02/21/2022   Procedure: CATARACT EXTRACTION PHACO AND INTRAOCULAR LENS PLACEMENT (IOC);  Surgeon: Fabio Pierce, MD;  Location: [facility] OR[facility];  Service: Ophthalmology;  Laterality: Right;  CDE: 9.42   ESI     LUMBAR MICRODISCECTOMY  2019  L3-4   LUMBAR MICRODISCECTOMY  2008   L4-L5 repeated   MICRODISCECTOMY LUMBAR  2008   L4-5   MICRODISCECTOMY LUMBAR  2010   L4-L5 repeated   NODE DISSECTION Left 08/13/2020   Procedure: AXILLARY NODE DISSECTION;  Surgeon: Earline Mayotte, MD;  Location: [facility] ORS;  Service: General;  Laterality: Left;   PORTACATH PLACEMENT Right 09/05/2020   Procedure: INSERTION PORT-A-CATH;  Surgeon: Earline Mayotte, MD;  Location: [facility] ORS;  Service: General;  Laterality: Right;  right; monitor anesthesia care   SQUAMOUS CELL CARCINOMA EXCISION     TONSILLECTOMY  1971    SOCIAL HISTORY:   Social History   Tobacco Use   Smoking status: Former    Current  packs/day: 0.00    Average packs/day: 0.9 packs/day for 33.0 years (30.0 ttl pk-yrs)    Types: Cigarettes    Start date: 04/18/1985    Quit date: 04/19/2018    Years since quitting: 5.0   Smokeless tobacco: Never  Substance Use Topics   Alcohol use: Yes    Comment: rarely    FAMILY HISTORY:   Family History  Problem Relation Age of Onset   Breast cancer Mother 37   Osteosarcoma Mother        d. 67   Hypertension Brother    COPD Father    Hypertension Father    Breast cancer Paternal Aunt        dx 67s   Breast cancer Paternal Grandmother        dx 28s   Stomach cancer Paternal Grandfather        dx 65s   Breast cancer Paternal Aunt        dx 46s   Brain cancer Paternal Aunt        dx 59s    DRUG ALLERGIES:   Allergies  Allergen Reactions   Onion Nausea And Vomiting   Other Itching and Other (See Comments)    Brand name bandaids with cloth backing causes redness and itching   Statins     Muscle weakness   Stiolto Respimat [Tiotropium Bromide-Olodaterol]    Zetia [Ezetimibe]     Leg pain, upset stomach   Sulfa Antibiotics Rash    REVIEW OF SYSTEMS:   ROS As per history of present illness. All pertinent systems were reviewed above. Constitutional, HEENT, cardiovascular, respiratory, GI, GU, musculoskeletal, neuro, psychiatric, endocrine, integumentary and hematologic systems were reviewed and are otherwise negative/unremarkable except for positive findings mentioned above in the HPI.   MEDICATIONS AT HOME:   Prior to Admission medications   Medication Sig Start Date End Date Taking? Authorizing Provider  acetaminophen (TYLENOL) 325 MG tablet Take 2 tablets (650 mg total) by mouth every 4 (four) hours as needed for mild pain (pain score 1-3), fever or headache (or Fever >/= 101). 04/25/23   Emokpae, Courage, MD  albuterol (PROVENTIL) (2.5 MG/3ML) 0.083% nebulizer solution Take 3 mLs (2.5 mg total) by nebulization 4 (four) times daily as needed for wheezing or  shortness of breath. 04/25/23   Mariea Clonts, Courage, MD  albuterol (VENTOLIN HFA) 108 (90 Base) MCG/ACT inhaler Inhale 2 puffs into the lungs every 6 (six) hours as needed for wheezing or shortness of breath. 04/25/23   Emokpae, Courage, MD  amLODipine (NORVASC) 5 MG tablet Take 5 mg by mouth daily. 10/09/22   [provider]  aspirin EC 81 MG tablet Take 1 tablet (81 mg total) by mouth daily with breakfast. Swallow whole. 04/25/23 04/24/24  Shon Hale,  MD  Biotin 1000 MCG tablet Take 1,000 mcg by mouth daily.    [provider]  cholecalciferol (VITAMIN D) 25 MCG (1000 UNIT) tablet Take 1,000 Units by mouth daily.    [provider]  clobetasol (TEMOVATE) 0.05 % external solution Apply 1 Application topically daily as needed (Dermatitis). 12/11/22   [provider]  cyanocobalamin (VITAMIN B12) 1000 MCG/ML injection INJECT 1 ML (1,000 MCG TOTAL) INTO THE MUSCLE EVERY 30 (THIRTY) DAYS. 01/05/23   Creig Hines, MD  fluticasone-salmeterol (ADVAIR) 100-50 MCG/ACT AEPB Inhale 1 puff into the lungs daily. 03/17/23   [provider]  furosemide (LASIX) 40 MG tablet Take 1 tablet (40 mg total) by mouth in the morning. Take for up to 5 days at a time as needed for fluid buildup 04/25/23 04/24/24  Emokpae, Courage, MD  guaiFENesin-dextromethorphan (ROBITUSSIN DM) 100-10 MG/5ML syrup Take 10 mLs by mouth every 8 (eight) hours as needed for cough. 01/21/23   Vassie Loll, MD  hydrocortisone 2.5 % lotion Apply 1 application topically daily as needed (when washing face).    [provider]  letrozole [facility]) 2.5 MG tablet TAKE 1 TABLET EVERY DAY 01/05/23   Creig Hines, MD  losartan (COZAAR) 100 MG tablet TAKE 1 TABLET EVERY DAY 05/10/21   Bosie Clos, MD  MELATONIN PO Take 1 tablet by mouth at bedtime as needed (Sleep).    [provider]  Menthol, Topical Analgesic, (BIOFREEZE EX) Apply 1 application topically daily as needed (back pain).     [provider]  mometasone (ELOCON) 0.1 % cream Apply 1 application topically daily as needed (ear irritation).    [provider]  oxyCODONE (OXY IR/ROXICODONE) 5 MG immediate release tablet Take 1 tablet (5 mg total) by mouth every 6 (six) hours as needed for moderate pain (pain score 4-6) or severe pain (pain score 7-10) (back pain). 04/25/23   Shon Hale, MD  pantoprazole (PROTONIX) 40 MG tablet Take 1 tablet (40 mg total) by mouth daily. Patient taking differently: Take 40 mg by mouth daily as needed (Indigestion). 01/21/23 01/21/24  Vassie Loll, MD  propranolol (INDERAL) 10 MG tablet Take 10-20 mg by mouth 3 (three) times daily as needed (Anxiety). 02/05/23   [provider]  Propylene Glycol (SYSTANE COMPLETE) 0.6 % SOLN Apply 1 Application to eye as needed (Dry eye).    [provider]  rosuvastatin (CRESTOR) 5 MG tablet Take 1 tablet (5 mg total) by mouth daily. 05/22/21   Bosie Clos, MD  Semaglutide,0.25 or 0.5MG /DOS, (OZEMPIC, 0.25 OR 0.5 MG/DOSE,) 2 MG/1.5ML SOPN Inject 0.25 mg into the skin every 7 (seven) days. 07/03/22   [provider]  venlafaxine XR (EFFEXOR-XR) 75 MG 24 hr capsule TAKE 3 CAPSULES (225 MG TOTAL) BY MOUTH DAILY WITH BREAKFAST. Patient taking differently: Take 150 mg by mouth at bedtime. 01/10/21   Bosie Clos, MD      VITAL SIGNS:  Blood pressure 131/62, pulse 80, temperature 98.9 F (37.2 C), temperature source Oral, resp. rate (!) 24, height 5\' 3"  (1.6 m), weight 112.5 kg, SpO2 93%.  PHYSICAL EXAMINATION:  Physical Exam  GENERAL:  73 y.o.-year-old Caucasian female patient lying in the bed with mild respiratory distress with conversational dyspnea. EYES: Pupils equal, round, reactive to light and accommodation. No scleral icterus. Extraocular muscles intact.  HEENT: Head atraumatic, normocephalic. Oropharynx and nasopharynx clear.  NECK:  Supple, no jugular venous distention. No thyroid  enlargement, no tenderness.  LUNGS: Normal breath  sounds bilaterally, no wheezing, rales,rhonchi or crepitation. No use of accessory muscles of respiration.  CARDIOVASCULAR: Regular rate and rhythm, S1, S2 normal. No murmurs, rubs, or gallops.  ABDOMEN: Diffuse expiratory wheezes with tight expiratory airflow and the heart resume currently breathing.  No organomegaly or mass.  EXTREMITIES: No pedal edema, cyanosis, or clubbing.  NEUROLOGIC: Cranial nerves II through XII are intact. Muscle strength 5/5 in all extremities. Sensation intact. Gait not checked.  PSYCHIATRIC: The patient is alert and oriented x 3.  Normal affect and good eye contact. SKIN: No obvious rash, lesion, or ulcer.   LABORATORY PANEL:   CBC Recent Labs  Lab 05/03/23 0052  WBC 11.8*  HGB 10.8*  HCT 35.0*  PLT 231   ------------------------------------------------------------------------------------------------------------------  Chemistries  Recent Labs  Lab 05/03/23 0052  NA 138  K 4.2  CL 100  CO2 28  GLUCOSE 108*  BUN 27*  CREATININE 1.02*  CALCIUM 8.4*   ------------------------------------------------------------------------------------------------------------------  Cardiac Enzymes No results for input(s): "TROPONINI" in the last 168 hours. ------------------------------------------------------------------------------------------------------------------  RADIOLOGY:  DG Chest Portable 1 View Result Date: 05/03/2023 CLINICAL DATA:  Shortness of breath EXAM: PORTABLE CHEST 1 VIEW COMPARISON:  04/23/2023 FINDINGS: Heart and mediastinal contours are within normal limits. No focal opacities or effusions. No acute bony abnormality. IMPRESSION: No active disease. Electronically Signed   By: Charlett Nose M.D.   On: 05/03/2023 00:55      IMPRESSION AND PLAN:  Assessment and Plan: * COPD exacerbation (HCC) - The patient will be admitted to a medically monitored bed. - We will place the patient IV  steroid therapy with IV Solu-Medrol as well as nebulized bronchodilator therapy with duonebs q.i.d. and q.4 hours p.r.n.Marland Kitchen - Mucolytic therapy will be provided with Mucinex and antibiotic therapy with IV Rocephin.  - O2 protocol will be followed.   Acute respiratory failure with hypoxia (HCC) - O2 protocol will be followed. - Management otherwise as above.  Essential hypertension - We will continue antihypertensive therapy.  History of breast cancer - We will continue letrozole.  Dyslipidemia - We will continue statin therapy.  GERD without esophagitis - We will continue PPI therapy.  Depression - We will continue Effexor XR.   DVT prophylaxis: Lovenox.  Advanced Care Planning:  Code Status: The patient is DNR and DNI.  Was discussed with her. Family Communication:  The plan of care was discussed in details with the patient (and family). I answered all questions. The patient agreed to proceed with the above mentioned plan. Further management will depend upon hospital course. Disposition Plan: Back to previous home environment Consults called: none.  All the records are reviewed and case discussed with ED provider.  Status: Inpatient  At the time of the admission, it appears that the appropriate admission status for this patient is inpatient.  This is judged to be reasonable and necessary in order to provide the required intensity of service to ensure the patient's safety given the presenting symptoms, physical exam findings and initial radiographic and laboratory data in the context of comorbid conditions.  The patient requires inpatient status due to high intensity of service, high risk of further deterioration and high frequency of surveillance required.  I certify that at the time of admission, it is my clinical judgment that the patient will require inpatient hospital care extending more than 2 midnights.  Dispo: The patient is from: Home               Anticipated d/c is to: Home              Patient currently is not medically stable to d/c.              Difficult to place patient: No  Hannah Beat M.D on 05/03/2023 at 3:49 AM  [facility]   From 7 PM-7 AM, contact night-coverage [email or web address]  CC: Primary care physician; Oneal Grout, FNP

## 2023-05-04 DIAGNOSIS — I10 Essential (primary) hypertension: Secondary | ICD-10-CM | POA: Diagnosis not present

## 2023-05-04 DIAGNOSIS — E785 Hyperlipidemia, unspecified: Secondary | ICD-10-CM | POA: Diagnosis not present

## 2023-05-04 DIAGNOSIS — J9601 Acute respiratory failure with hypoxia: Secondary | ICD-10-CM | POA: Diagnosis not present

## 2023-05-04 DIAGNOSIS — J441 Chronic obstructive pulmonary disease with (acute) exacerbation: Secondary | ICD-10-CM | POA: Diagnosis not present

## 2023-05-04 DIAGNOSIS — K219 Gastro-esophageal reflux disease without esophagitis: Secondary | ICD-10-CM

## 2023-05-04 MED ORDER — IPRATROPIUM-ALBUTEROL 0.5-2.5 (3) MG/3ML IN SOLN
3.0000 mL | Freq: Three times a day (TID) | RESPIRATORY_TRACT | Status: AC
Start: 2023-05-04 — End: 2023-05-06
  Administered 2023-05-04 – 2023-05-06 (×8): 3 mL via RESPIRATORY_TRACT
  Filled 2023-05-04 (×8): qty 3

## 2023-05-04 MED ORDER — SACCHAROMYCES BOULARDII 250 MG PO CAPS
250.0000 mg | ORAL_CAPSULE | Freq: Two times a day (BID) | ORAL | Status: DC
  Administered 2023-05-04 – 2023-05-09 (×11): 250 mg via ORAL
  Filled 2023-05-04 (×11): qty 1

## 2023-05-04 MED ORDER — GUAIFENESIN ER 600 MG PO TB12
1200.0000 mg | ORAL_TABLET | Freq: Two times a day (BID) | ORAL | Status: DC
  Administered 2023-05-04 – 2023-05-09 (×11): 1200 mg via ORAL
  Filled 2023-05-04 (×12): qty 2

## 2023-05-04 MED ORDER — AZITHROMYCIN 250 MG PO TABS
500.0000 mg | ORAL_TABLET | Freq: Every day | ORAL | Status: AC
  Administered 2023-05-04 – 2023-05-06 (×3): 500 mg via ORAL
  Filled 2023-05-04 (×3): qty 2

## 2023-05-04 NOTE — Progress Notes (Signed)
 Mobility Specialist Progress Note:    05/04/23 1415  Mobility  Activity Ambulated with assistance in room;Ambulated with assistance to bathroom  Level of Assistance Standby assist, set-up cues, supervision of patient - no hands on  Assistive Device [employer] Ambulated (ft) 35 ft  Range of Motion/Exercises Active;All extremities  Activity Response Tolerated well  Mobility Referral Yes  Mobility visit 1 Mobility  Mobility Specialist Start Time (ACUTE ONLY) 1350  Mobility Specialist Stop Time (ACUTE ONLY) 1415  Mobility Specialist Time Calculation (min) (ACUTE ONLY) 25 min   Pt received in bed, family at bedside. Agreeable to mobility, required supervision to stand and ambulate with cane. Tolerated well, SpO2 88-94% on RA throughout. Extreme SOB throughout session. Returned pt supine, recovered. All needs met.   Lawerance Bach Mobility Specialist Please contact via Special educational needs teacher or  Rehab office at [phone]

## 2023-05-04 NOTE — Progress Notes (Signed)
 PROGRESS NOTE   RHIANNA Dean  XLK:[id number] DOB: 25-Oct-1950 DOA: 05/03/2023 PCP: Oneal Grout, FNP   Chief Complaint  Patient presents with   Shortness of Breath   Level of care: Med-Surg  Brief Admission History:  73 y.o. Caucasian female with medical history significant for essential hypertension, COPD, hypertension, and depression, who presented to the emergency room with acute onset of dyspnea with associated dyspnea with associated wheezing as well as mildly productive cough of yellowish sputum that started earlier during the day.  She used her nebulizer with mild relief.  Pulse oximetry was in the upper 70s and therefore EMS was called.  No fever or chills.  No chest pain or palpitations.  No nausea or vomiting or abdominal pain.  No dysuria, oliguria or hematuria or flank pain.    The patient was given 2 g of IV magnesium sulfate, DuoNeb and 125 mg of IV Solu-Medrol.  She will be admitted to a medical telemetry bed for further evaluation and management.   Assessment and Plan:  COPD exacerbation - Pt continues wheezing, coughing, with shortness of breath and new supplemental oxygen requirements - continue IV Solu-Medrol as well as nebulized bronchodilator therapy with duonebs q.i.d. and q.4 hours p.r.n.Marland Kitchen - Mucolytic therapy will be provided with Mucinex and antibiotic therapy  Dyslipidemia - continue statin therapy.  GERD without esophagitis - continue PPI therapy.  Depression - continue Effexor XR.  Essential hypertension - continue antihypertensive therapy.  History of breast cancer - continue letrozole.  Acute respiratory failure with hypoxia  - O2 protocol will be followed. - home O2 evaluation and possible need for home oxygen  DVT prophylaxis: enoxaparin Code Status: DNR/DNI  Family Communication:  Disposition: home   Consultants:   Procedures:   Antimicrobials:  Ceftriaxone  Azithromycin   Subjective: Pt reports that she is still coughing and  SOB and wheezing, not feeling back to baseline and still needing oxygen especially when trying to ambulate.   Objective: Vitals:   05/03/23 1953 05/03/23 2022 05/04/23 0318 05/04/23 0718  BP: 131/64  (!) 167/87   Pulse: 96  99   Resp: (!) 22  (!) 22   Temp: 97.9 F (36.6 C)  98.1 F (36.7 C)   TempSrc: Oral  Oral   SpO2: 93% 93% 94% 95%  Weight:      Height:        Intake/Output Summary (Last 24 hours) at 05/04/2023 0853 Last data filed at 05/04/2023 3664 Gross per 24 hour  Intake 1638.19 ml  Output 550 ml  Net 1088.19 ml   Filed Weights   05/03/23 0013  Weight: 112.5 kg   Examination:  General exam: Appears calm and comfortable  Respiratory system: loud upper airway wheezing heard anteriorly Cardiovascular system: normal S1 & S2 heard. No JVD, murmurs, rubs, gallops or clicks. No pedal edema. Gastrointestinal system: Abdomen is nondistended, soft and nontender. No organomegaly or masses felt. Normal bowel sounds heard. Central nervous system: Alert and oriented. No focal neurological deficits. Extremities: Symmetric 5 x 5 power. Skin: No rashes, lesions or ulcers. Psychiatry: Judgement and insight appear normal. Mood & affect appropriate.   Data Reviewed: I have personally reviewed following labs and imaging studies  CBC: Recent Labs  Lab 05/03/23 0052 05/03/23 0417  WBC 11.8* 12.1*  NEUTROABS 8.7*  --   HGB 10.8* 11.4*  HCT 35.0* 36.8  MCV 90.0 89.8  PLT 231 231    Basic Metabolic Panel: Recent Labs  Lab 05/03/23 0052 05/03/23 0417  NA 138 137  K 4.2 4.3  CL 100 100  CO2 28 25  GLUCOSE 108* 127*  BUN 27* 27*  CREATININE 1.02* 0.96  CALCIUM 8.4* 8.5*    CBG: No results for input(s): "GLUCAP" in the last 168 hours.  Recent Results (from the past 240 hours)  Resp panel by RT-PCR (RSV, Flu A&B, Covid) Anterior Nasal Swab     Status: None   Collection Time: 05/03/23  2:10 AM   Specimen: Anterior Nasal Swab  Result Value Ref Range Status   SARS  Coronavirus 2 by RT PCR NEGATIVE NEGATIVE Final    Comment: (NOTE) SARS-CoV-2 target nucleic acids are NOT DETECTED.  The SARS-CoV-2 RNA is generally detectable in upper respiratory specimens during the acute phase of infection. The lowest concentration of SARS-CoV-2 viral copies this assay can detect is 138 copies/mL. A negative result does not preclude SARS-Cov-2 infection and should not be used as the sole basis for treatment or other patient management decisions. A negative result may occur with  improper specimen collection/handling, submission of specimen other than nasopharyngeal swab, presence of viral mutation(s) within the areas targeted by this assay, and inadequate number of viral copies(<138 copies/mL). A negative result must be combined with clinical observations, patient history, and epidemiological information. The expected result is Negative.  Fact Sheet for Patients:  BloggerCourse.com  Fact Sheet for Healthcare Providers:  SeriousBroker.it  This test is no t yet approved or cleared by the [place] FDA and  has been authorized for detection and/or diagnosis of SARS-CoV-2 by FDA under an Emergency Use Authorization (EUA). This EUA will remain  in effect (meaning this test can be used) for the duration of the COVID-19 declaration under Section 564(b)(1) of the Act, 21 U.S.C.section 360bbb-3(b)(1), unless the authorization is terminated  or revoked sooner.       Influenza A by PCR NEGATIVE NEGATIVE Final   Influenza B by PCR NEGATIVE NEGATIVE Final    Comment: (NOTE) The Xpert Xpress SARS-CoV-2/FLU/RSV plus assay is intended as an aid in the diagnosis of influenza from Nasopharyngeal swab specimens and should not be used as a sole basis for treatment. Nasal washings and aspirates are unacceptable for Xpert Xpress SARS-CoV-2/FLU/RSV testing.  Fact Sheet for  Patients: BloggerCourse.com  Fact Sheet for Healthcare Providers: SeriousBroker.it  This test is not yet approved or cleared by the [place] FDA and has been authorized for detection and/or diagnosis of SARS-CoV-2 by FDA under an Emergency Use Authorization (EUA). This EUA will remain in effect (meaning this test can be used) for the duration of the COVID-19 declaration under Section 564(b)(1) of the Act, 21 U.S.C. section 360bbb-3(b)(1), unless the authorization is terminated or revoked.     Resp Syncytial Virus by PCR NEGATIVE NEGATIVE Final    Comment: (NOTE) Fact Sheet for Patients: BloggerCourse.com  Fact Sheet for Healthcare Providers: SeriousBroker.it  This test is not yet approved or cleared by the [place] FDA and has been authorized for detection and/or diagnosis of SARS-CoV-2 by FDA under an Emergency Use Authorization (EUA). This EUA will remain in effect (meaning this test can be used) for the duration of the COVID-19 declaration under Section 564(b)(1) of the Act, 21 U.S.C. section 360bbb-3(b)(1), unless the authorization is terminated or revoked.  Performed at [facility], [address], [place], [place] [address]     Radiology Studies: DG Chest Portable 1 View Result Date: 05/03/2023 CLINICAL DATA:  Shortness of breath EXAM: PORTABLE CHEST 1 VIEW COMPARISON:  04/23/2023 FINDINGS:  Heart and mediastinal contours are within normal limits. No focal opacities or effusions. No acute bony abnormality. IMPRESSION: No active disease. Electronically Signed   By: Charlett Nose M.D.   On: 05/03/2023 00:55   Scheduled Meds:  amLODipine  5 mg Oral Daily   aspirin EC  81 mg Oral Q breakfast   cholecalciferol  1,000 Units Oral Daily   enoxaparin (LOVENOX) injection  40 mg Subcutaneous Q24H   furosemide  40 mg Oral Daily   guaiFENesin  600 mg Oral BID    ipratropium-albuterol  3 mL Nebulization TID   letrozole  2.5 mg Oral Daily   losartan  100 mg Oral Daily   methylPREDNISolone (SOLU-MEDROL) injection  60 mg Intravenous Q12H   pantoprazole  40 mg Oral QPM   rosuvastatin  5 mg Oral Daily   venlafaxine XR  150 mg Oral Q breakfast   Continuous Infusions:  cefTRIAXone (ROCEPHIN)  IV Stopped (05/04/23 0406)    LOS: 1 day   Time spent: 44 mins  Christina Waldrop Laural Benes, MD How to contact the [facility] Attending or Consulting provider 7A - 7P or covering provider during after hours 7P -7A, for this patient?  Check the care team in [facility] and look for a) attending/consulting TRH provider listed and b) the [facility] team listed Log into [email or web address] to find provider on call.  Locate the [facility] provider you are looking for under [facility] and page to a number that you can be directly reached. If you still have difficulty reaching the provider, please page the [facility] (Director on Call) for the Hospitalists listed on amion for assistance.  05/04/2023, 8:53 AM

## 2023-05-05 DIAGNOSIS — I10 Essential (primary) hypertension: Secondary | ICD-10-CM | POA: Diagnosis not present

## 2023-05-05 DIAGNOSIS — J441 Chronic obstructive pulmonary disease with (acute) exacerbation: Secondary | ICD-10-CM | POA: Diagnosis not present

## 2023-05-05 DIAGNOSIS — J9601 Acute respiratory failure with hypoxia: Secondary | ICD-10-CM | POA: Diagnosis not present

## 2023-05-05 DIAGNOSIS — E785 Hyperlipidemia, unspecified: Secondary | ICD-10-CM | POA: Diagnosis not present

## 2023-05-05 MED ORDER — MOMETASONE FURO-FORMOTEROL FUM 100-5 MCG/ACT IN AERO
2.0000 | INHALATION_SPRAY | Freq: Two times a day (BID) | RESPIRATORY_TRACT | Status: DC
  Administered 2023-05-05 – 2023-05-06 (×3): 2 via RESPIRATORY_TRACT
  Filled 2023-05-05 (×2): qty 8.8

## 2023-05-05 NOTE — Progress Notes (Signed)
 PROGRESS NOTE   Victoria Dean  LKG:[id number] DOB: April 26, 1950 DOA: 05/03/2023 PCP: Oneal Grout, FNP   Chief Complaint  Patient presents with   Shortness of Breath   Level of care: Med-Surg  Brief Admission History:  73 y.o. Caucasian female with medical history significant for essential hypertension, COPD, hypertension, and depression, who presented to the emergency room with acute onset of dyspnea with associated dyspnea with associated wheezing as well as mildly productive cough of yellowish sputum that started earlier during the day.  She used her nebulizer with mild relief.  Pulse oximetry was in the upper 70s and therefore EMS was called.  No fever or chills.  No chest pain or palpitations.  No nausea or vomiting or abdominal pain.  No dysuria, oliguria or hematuria or flank pain.    The patient was given 2 g of IV magnesium sulfate, DuoNeb and 125 mg of IV Solu-Medrol.  She will be admitted to a medical telemetry bed for further evaluation and management.   Assessment and Plan:  COPD exacerbation - Pt doesn't feel well enough to go home; she continues wheezing, coughing, with shortness of breath and new supplemental oxygen requirements - continue IV Solu-Medrol as well as nebulized bronchodilator therapy with duonebs q.i.d. and q.4 hours p.r.n. - Mucolytic therapy will be provided with Mucinex and antibiotic therapy  Dyslipidemia - continue statin therapy.  GERD without esophagitis - continue PPI therapy.  Depression - continue Effexor XR.  Essential hypertension - continue antihypertensive therapy.  History of breast cancer - continue letrozole.  Acute respiratory failure with hypoxia  - O2 protocol will be followed. - home O2 evaluation and possible need for home oxygen  DVT prophylaxis: enoxaparin Code Status: DNR/DNI  Family Communication:  Disposition: home   Consultants:   Procedures:   Antimicrobials:  Ceftriaxone  Azithromycin    Subjective: Says she is feeling ill, coughing and ongoing SOB and wheezing, does not feel well enough for home    Objective: Vitals:   05/05/23 0428 05/05/23 0818 05/05/23 0836 05/05/23 1157  BP: (!) 168/82  (!) 162/82   Pulse: 92  97 98  Resp: (!) 22   (!) 22  Temp: 97.7 F (36.5 C)     TempSrc: Oral     SpO2: 92% (!) 88% 95% 92%  Weight:      Height:        Intake/Output Summary (Last 24 hours) at 05/05/2023 1258 Last data filed at 05/05/2023 0534 Gross per 24 hour  Intake 480 ml  Output --  Net 480 ml   Filed Weights   05/03/23 0013  Weight: 112.5 kg   Examination:  General exam: Appears calm and comfortable  Respiratory system: persistent loud upper airway wheezing heard anteriorly Cardiovascular system: normal S1 & S2 heard. No JVD, murmurs, rubs, gallops or clicks. No pedal edema. Gastrointestinal system: Abdomen is nondistended, soft and nontender. No organomegaly or masses felt. Normal bowel sounds heard. Central nervous system: Alert and oriented. No focal neurological deficits. Extremities: Symmetric 5 x 5 power. Skin: No rashes, lesions or ulcers. Psychiatry: Judgement and insight appear normal. Mood & affect appropriate.   Data Reviewed: I have personally reviewed following labs and imaging studies  CBC: Recent Labs  Lab 05/03/23 0052 05/03/23 0417  WBC 11.8* 12.1*  NEUTROABS 8.7*  --   HGB 10.8* 11.4*  HCT 35.0* 36.8  MCV 90.0 89.8  PLT 231 231    Basic Metabolic Panel: Recent Labs  Lab 05/03/23 0052 05/03/23  0417  NA 138 137  K 4.2 4.3  CL 100 100  CO2 28 25  GLUCOSE 108* 127*  BUN 27* 27*  CREATININE 1.02* 0.96  CALCIUM 8.4* 8.5*    CBG: No results for input(s): "GLUCAP" in the last 168 hours.  Recent Results (from the past 240 hours)  Resp panel by RT-PCR (RSV, Flu A&B, Covid) Anterior Nasal Swab     Status: None   Collection Time: 05/03/23  2:10 AM   Specimen: Anterior Nasal Swab  Result Value Ref Range Status   SARS  Coronavirus 2 by RT PCR NEGATIVE NEGATIVE Final    Comment: (NOTE) SARS-CoV-2 target nucleic acids are NOT DETECTED.  The SARS-CoV-2 RNA is generally detectable in upper respiratory specimens during the acute phase of infection. The lowest concentration of SARS-CoV-2 viral copies this assay can detect is 138 copies/mL. A negative result does not preclude SARS-Cov-2 infection and should not be used as the sole basis for treatment or other patient management decisions. A negative result may occur with  improper specimen collection/handling, submission of specimen other than nasopharyngeal swab, presence of viral mutation(s) within the areas targeted by this assay, and inadequate number of viral copies(<138 copies/mL). A negative result must be combined with clinical observations, patient history, and epidemiological information. The expected result is Negative.  Fact Sheet for Patients:  BloggerCourse.com  Fact Sheet for Healthcare Providers:  SeriousBroker.it  This test is no t yet approved or cleared by the [place] FDA and  has been authorized for detection and/or diagnosis of SARS-CoV-2 by FDA under an Emergency Use Authorization (EUA). This EUA will remain  in effect (meaning this test can be used) for the duration of the COVID-19 declaration under Section 564(b)(1) of the Act, 21 U.S.C.section 360bbb-3(b)(1), unless the authorization is terminated  or revoked sooner.       Influenza A by PCR NEGATIVE NEGATIVE Final   Influenza B by PCR NEGATIVE NEGATIVE Final    Comment: (NOTE) The Xpert Xpress SARS-CoV-2/FLU/RSV plus assay is intended as an aid in the diagnosis of influenza from Nasopharyngeal swab specimens and should not be used as a sole basis for treatment. Nasal washings and aspirates are unacceptable for Xpert Xpress SARS-CoV-2/FLU/RSV testing.  Fact Sheet for  Patients: BloggerCourse.com  Fact Sheet for Healthcare Providers: SeriousBroker.it  This test is not yet approved or cleared by the [place] FDA and has been authorized for detection and/or diagnosis of SARS-CoV-2 by FDA under an Emergency Use Authorization (EUA). This EUA will remain in effect (meaning this test can be used) for the duration of the COVID-19 declaration under Section 564(b)(1) of the Act, 21 U.S.C. section 360bbb-3(b)(1), unless the authorization is terminated or revoked.     Resp Syncytial Virus by PCR NEGATIVE NEGATIVE Final    Comment: (NOTE) Fact Sheet for Patients: BloggerCourse.com  Fact Sheet for Healthcare Providers: SeriousBroker.it  This test is not yet approved or cleared by the [place] FDA and has been authorized for detection and/or diagnosis of SARS-CoV-2 by FDA under an Emergency Use Authorization (EUA). This EUA will remain in effect (meaning this test can be used) for the duration of the COVID-19 declaration under Section 564(b)(1) of the Act, 21 U.S.C. section 360bbb-3(b)(1), unless the authorization is terminated or revoked.  Performed at [facility], [address], [place], [place] [address]     Radiology Studies: No results found.  Scheduled Meds:  amLODipine  5 mg Oral Daily   aspirin EC  81 mg Oral  Q breakfast   azithromycin  500 mg Oral Daily   cholecalciferol  1,000 Units Oral Daily   enoxaparin (LOVENOX) injection  40 mg Subcutaneous Q24H   furosemide  40 mg Oral Daily   guaiFENesin  1,200 mg Oral BID   ipratropium-albuterol  3 mL Nebulization TID   letrozole  2.5 mg Oral Daily   losartan  100 mg Oral Daily   methylPREDNISolone (SOLU-MEDROL) injection  60 mg Intravenous Q12H   mometasone-formoterol  2 puff Inhalation BID   pantoprazole  40 mg Oral QPM   rosuvastatin  5 mg Oral Daily   saccharomyces boulardii  250  mg Oral BID   venlafaxine XR  150 mg Oral Q breakfast   Continuous Infusions:  cefTRIAXone (ROCEPHIN)  IV 2 g (05/05/23 0203)    LOS: 2 days   Time spent: 50 mins  Kasia Trego Laural Benes, MD How to contact the [facility] Attending or Consulting provider 7A - 7P or covering provider during after hours 7P -7A, for this patient?  Check the care team in [facility] and look for a) attending/consulting TRH provider listed and b) the [facility] team listed Log into [email or web address] to find provider on call.  Locate the [facility] provider you are looking for under [facility] and page to a number that you can be directly reached. If you still have difficulty reaching the provider, please page the [facility] (Director on Call) for the Hospitalists listed on amion for assistance.  05/05/2023, 12:58 PM

## 2023-05-05 NOTE — Plan of Care (Signed)

## 2023-05-05 NOTE — Progress Notes (Signed)
 Mobility Specialist Progress Note:    05/05/23 0935  Mobility  Activity Ambulated with assistance in hallway  Level of Assistance Standby assist, set-up cues, supervision of patient - no hands on  Assistive Device Cane  Distance Ambulated (ft) 60 ft  Range of Motion/Exercises Active;All extremities  Activity Response Tolerated well  Mobility Referral Yes  Mobility visit 1 Mobility  Mobility Specialist Start Time (ACUTE ONLY) 0915  Mobility Specialist Stop Time (ACUTE ONLY) 0935  Mobility Specialist Time Calculation (min) (ACUTE ONLY) 20 min   Pt received in bed, agreeable to mobility. Required supervision to stand and ambulate with cane. Tolerated well, SpO2 90-93% on RA throughout. Audible SOB. Returned to room, left pt sitting EOB. All needs met.  Lawerance Bach Mobility Specialist Please contact via Special educational needs teacher or  Rehab office at [phone]

## 2023-05-05 NOTE — Evaluation (Signed)
 Physical Therapy Evaluation Patient Details Name: Victoria Dean MRN: [id number] DOB: 21-Aug-1950 Today's Date: 05/05/2023  History of Present Illness  Victoria Dean is a 73 y.o. Caucasian female with medical history significant for essential hypertension, COPD, hypertension, and depression, who presented to the emergency room with acute onset of dyspnea with associated dyspnea with associated wheezing as well as mildly productive cough of yellowish sputum that started earlier during the day.  She used her nebulizer with mild relief.  Pulse oximetry was in the upper 70s and therefore EMS was called.  No fever or chills.  No chest pain or palpitations.  No nausea or vomiting or abdominal pain.  No dysuria, oliguria or hematuria or flank pain.    Clinical Impression  Patient tolerated PT evaluation well. Patient was mod(I) with bed mobility, and required (S) - (I) with functional transfers and ambulation with a SPC. Patient reports previous (I) with all ADLs and iADLS. Main limitation on this day is fatigue, SOB, and productive cough. Patient and daughter who is present during session report an episode that occurred today where patient had an extreme coughing episode and then experienced "seizure-like" episode and was unable to move and only able to track with her eyes. Reports it lasted only a couple of seconds and this was the first time she's experienced this. During ambulation trial, SpO2 drops to ~79 % on RA but quickly returns to 88% or above with education and cont verbal cueing on pursed lip breathing. Pt reports 7/10 for fatigue following. With seated rest, SpO2 returns to 90% or above. Patient reports other than SOB, functionally she is at her baseline. Patient does not present with urgent need for skilled physical therapy at this time.  Patient discharged to care of nursing for ambulation daily as tolerated for length of stay.       If plan is discharge home, recommend the following:     Can  travel by private vehicle        Equipment Recommendations None recommended by PT  Recommendations for Other Services       Functional Status Assessment Patient has had a recent decline in their functional status and demonstrates the ability to make significant improvements in function in a reasonable and predictable amount of time.     Precautions / Restrictions Precautions Precautions: None Restrictions Weight Bearing Restrictions Per Provider Order: No      Mobility  Bed Mobility Overal bed mobility: Modified Independent             General bed mobility comments: HOB slightly elevated to mimic home, use of bed rail on R    Transfers Overall transfer level: Modified independent Equipment used: Straight cane               General transfer comment: No AD initially with first STS. No LOB or unsteadiness. Remainder with SPC. and supervision    Ambulation/Gait Ambulation/Gait assistance: Modified independent (Device/Increase time), Supervision Gait Distance (Feet): 40 Feet Assistive device: Straight cane Gait Pattern/deviations: WFL(Within Functional Limits) Gait velocity: Dec     General Gait Details: Gait pattern WFL. Dec velocity 2/2 inc SOB and fatigue. no overt LOB or fatigue. SpO2 drops to ~79 % on RA but quickly returns to 88% or above with education and cont verbal cueing on pursed lip breathing. Pt reports 7/10 for fatigue.  Stairs            Wheelchair Mobility     Tilt Bed    Modified  Rankin (Stroke Patients Only)       Balance Overall balance assessment: Mild deficits observed, not formally tested (Good EOB seated balance. SPC used during func activity while assessing standing balance. no overt LOB but some mild drifting during gait.)                                           Pertinent Vitals/Pain Pain Assessment Pain Assessment: 0-10 Pain Score: 4  Pain Location: Low back, L shoulder blade Pain Descriptors /  Indicators: Aching, Constant Pain Intervention(s): Monitored during session, Repositioned    Home Living Family/patient expects to be discharged to:: Private residence Living Arrangements: Alone;Other relatives (Pt lives alone but will be discharging to daughter's mother in laws home, who is retired and IT trainer. Cant help physically.) Available Help at Discharge: Family;Available 24 hours/day Type of Home: House Home Access: Stairs to enter Entrance Stairs-Rails: None Entrance Stairs-Number of Steps: 1   Home Layout: One level Home Equipment: [place] Mor - single point      Prior Function Prior Level of Function : Independent/Modified Independent             Mobility Comments: Household and community ambulator prior to pneumonia in December 2024. ADLs Comments: (I) w/ ADLs. Assist w/ iADLs.     Extremity/Trunk Assessment   Upper Extremity Assessment Upper Extremity Assessment: Overall WFL for tasks assessed (WFL for functional activiites)    Lower Extremity Assessment Lower Extremity Assessment: Overall WFL for tasks assessed [facility] for functional activiites)    Cervical / Trunk Assessment Cervical / Trunk Assessment: Normal  Communication   Communication Communication: No apparent difficulties    Cognition Arousal: Alert Behavior During Therapy: WFL for tasks assessed/performed   PT - Cognitive impairments: No apparent impairments                         Following commands: Intact       Cueing Cueing Techniques: Verbal cues     General Comments      Exercises     Assessment/Plan    PT Assessment Patient does not need any further PT services  PT Problem List         PT Treatment Interventions      PT Goals (Current goals can be found in the Care Plan section)  Acute Rehab PT Goals Patient Stated Goal: Return home safely PT Goal Formulation: With patient/family Time For Goal Achievement: 05/12/23 Potential to Achieve Goals: Good     Frequency       Co-evaluation               AM-PAC PT "6 Clicks" Mobility  Outcome Measure Help needed turning from your back to your side while in a flat bed without using bedrails?: None Help needed moving from lying on your back to sitting on the side of a flat bed without using bedrails?: A Little Help needed moving to and from a bed to a chair (including a wheelchair)?: None Help needed standing up from a chair using your arms (e.g., wheelchair or bedside chair)?: None Help needed to walk in hospital room?: None Help needed climbing 3-5 steps with a railing? : A Little 6 Click Score: 22    End of Session   Activity Tolerance: Patient tolerated treatment well;Patient limited by fatigue Patient left: in bed;with family/visitor present;with call bell/phone within  reach   PT Visit Diagnosis: Unsteadiness on feet (R26.81);Other abnormalities of gait and mobility (R26.89);Difficulty in walking, not elsewhere classified (R26.2)    Time: 1610-9604 PT Time Calculation (min) (ACUTE ONLY): 21 min   Charges:   PT Evaluation $PT Eval Moderate Complexity: 1 Mod PT Treatments $Therapeutic Activity: 8-22 mins PT General Charges $$ ACUTE PT VISIT: 1 Visit         4:03 PM, 05/05/23 Yexalen Deike Powell-Butler, PT, DPT [place] with [facility]

## 2023-05-05 NOTE — Progress Notes (Signed)
 Initiated seizure precautions per md order. Bed rail padding in place, airway and suction device at bedside. Pt educated, no further questions at this time.

## 2023-05-06 DIAGNOSIS — I10 Essential (primary) hypertension: Secondary | ICD-10-CM | POA: Diagnosis not present

## 2023-05-06 DIAGNOSIS — J9601 Acute respiratory failure with hypoxia: Secondary | ICD-10-CM | POA: Diagnosis not present

## 2023-05-06 DIAGNOSIS — J441 Chronic obstructive pulmonary disease with (acute) exacerbation: Secondary | ICD-10-CM | POA: Diagnosis not present

## 2023-05-06 MED ORDER — REVEFENACIN 175 MCG/3ML IN SOLN
175.0000 ug | Freq: Every day | RESPIRATORY_TRACT | Status: DC
  Administered 2023-05-07 – 2023-05-09 (×3): 175 ug via RESPIRATORY_TRACT
  Filled 2023-05-06 (×2): qty 3

## 2023-05-06 MED ORDER — BUDESONIDE 0.5 MG/2ML IN SUSP
0.5000 mg | Freq: Two times a day (BID) | RESPIRATORY_TRACT | Status: DC
  Administered 2023-05-06 – 2023-05-09 (×6): 0.5 mg via RESPIRATORY_TRACT
  Filled 2023-05-06 (×6): qty 2

## 2023-05-06 MED ORDER — ARFORMOTEROL TARTRATE 15 MCG/2ML IN NEBU
15.0000 ug | INHALATION_SOLUTION | Freq: Two times a day (BID) | RESPIRATORY_TRACT | Status: DC
  Administered 2023-05-06 – 2023-05-09 (×6): 15 ug via RESPIRATORY_TRACT
  Filled 2023-05-06 (×6): qty 2

## 2023-05-06 MED ORDER — ENOXAPARIN SODIUM 60 MG/0.6ML IJ SOSY
55.0000 mg | PREFILLED_SYRINGE | INTRAMUSCULAR | Status: DC
  Administered 2023-05-06 – 2023-05-07 (×2): 55 mg via SUBCUTANEOUS
  Filled 2023-05-06: qty 0.6

## 2023-05-06 MED ORDER — AZITHROMYCIN 250 MG PO TABS
500.0000 mg | ORAL_TABLET | Freq: Every day | ORAL | Status: AC
  Administered 2023-05-07 – 2023-05-08 (×2): 500 mg via ORAL
  Filled 2023-05-06 (×2): qty 2

## 2023-05-06 MED ORDER — HYDRALAZINE HCL 10 MG PO TABS
10.0000 mg | ORAL_TABLET | Freq: Three times a day (TID) | ORAL | Status: DC
  Administered 2023-05-06 – 2023-05-09 (×9): 10 mg via ORAL
  Filled 2023-05-06 (×9): qty 1

## 2023-05-06 MED ORDER — ALBUTEROL SULFATE (2.5 MG/3ML) 0.083% IN NEBU
2.5000 mg | INHALATION_SOLUTION | Freq: Four times a day (QID) | RESPIRATORY_TRACT | Status: DC
  Administered 2023-05-07 (×3): 2.5 mg via RESPIRATORY_TRACT
  Filled 2023-05-06 (×3): qty 3

## 2023-05-06 NOTE — Progress Notes (Signed)
   05/06/23 1231  Vitals  Temp 97.8 F (36.6 C)  BP (!) 171/74  MAP (mmHg) 100  BP Location Right Arm  BP Method Automatic  Patient Position (if appropriate) Lying  Pulse Rate 100  Pulse Rate Source Monitor  Resp 17  MEWS COLOR  MEWS Score Color Green  Oxygen Therapy  SpO2 94 %  O2 Device Room Air  MEWS Score  MEWS Temp 0  MEWS Systolic 0  MEWS Pulse 0  MEWS RR 0  MEWS LOC 0  MEWS Score 0   MD Tat notified

## 2023-05-06 NOTE — Progress Notes (Signed)
 PROGRESS NOTE  Victoria Dean:[id number] DOB: 05-24-1950 DOA: 05/03/2023 PCP: Oneal Grout, FNP  Brief History:  73 y.o. Caucasian female with medical history significant for essential hypertension, COPD, hypertension, and depression, who presented to the emergency room with acute onset of dyspnea with associated dyspnea with associated wheezing as well as mildly productive cough of yellowish sputum that started earlier during the day.  She used her nebulizer with mild relief.  Pulse oximetry was in the upper 70s and therefore EMS was called.  No fever or chills.  No chest pain or palpitations.  No nausea or vomiting or abdominal pain.  No dysuria, oliguria or hematuria or flank pain.    The patient was given 2 g of IV magnesium sulfate, DuoNeb and 125 mg of IV Solu-Medrol.  She will be admitted to a medical telemetry bed for further evaluation and management.   Assessment/Plan: Acute respiratory failure with hypoxia  - initially on 3L - wean oxygen back to RA - Due to COPD exacerbation -COVID/Flu/RSV negative  COPD exacerbation - continue duonebs - continue IV Solu-Medrol therapy with duonebs  - add Yupelri - add brovana - add pulmicort -viral resp panel   Dyslipidemia - continue statin therapy.   GERD without esophagitis - continue PPI therapy.   Depression/Anxiety - continue Effexor XR.   Essential hypertension - continue losartan, amlodipine -add hydralazine   History of breast cancer - continue letrozole.  Class 3 obesity -BMI 43.93 -lifestyle modification        Family Communication:   daughter at bedside 4/2  Consultants:  none  Code Status:  DNR  DVT Prophylaxis:   [place] Lovenox   Procedures: As Listed in Progress Note Above  Antibiotics: Ceftriaxone 3/30>> Azithro 3/31>>     Subjective:  Pt states she continues to have cough and sob with mild exertion.  Denies n/v/d, cp, f/c. Objective: Vitals:   05/06/23 0842 05/06/23  0852 05/06/23 1231 05/06/23 1311  BP:   (!) 171/74   Pulse:   100   Resp:   17   Temp:   97.8 F (36.6 C)   TempSrc:      SpO2: 93% 96% 94% 91%  Weight:      Height:        Intake/Output Summary (Last 24 hours) at 05/06/2023 1713 Last data filed at 05/06/2023 1300 Gross per 24 hour  Intake 480 ml  Output --  Net 480 ml   Weight change:  Exam:  General:  Pt is alert, follows commands appropriately, not in acute distress HEENT: No icterus, No thrush, No neck mass, [place]/AT Cardiovascular: RRR, S1/S2, no rubs, no gallops Respiratory: upper airway wheeze.  Scattered bilateral rales. Abdomen: Soft/+BS, non tender, non distended, no guarding Extremities: Non pitting edema, No lymphangitis, No petechiae, No rashes, no synovitis   Data Reviewed: I have personally reviewed following labs and imaging studies Basic Metabolic Panel: Recent Labs  Lab 05/03/23 0052 05/03/23 0417  NA 138 137  K 4.2 4.3  CL 100 100  CO2 28 25  GLUCOSE 108* 127*  BUN 27* 27*  CREATININE 1.02* 0.96  CALCIUM 8.4* 8.5*   Liver Function Tests: No results for input(s): "AST", "ALT", "ALKPHOS", "BILITOT", "PROT", "ALBUMIN" in the last 168 hours. No results for input(s): "LIPASE", "AMYLASE" in the last 168 hours. No results for input(s): "AMMONIA" in the last 168 hours. Coagulation Profile: No results for input(s): "INR", "PROTIME" in the last 168  hours. CBC: Recent Labs  Lab 05/03/23 0052 05/03/23 0417  WBC 11.8* 12.1*  NEUTROABS 8.7*  --   HGB 10.8* 11.4*  HCT 35.0* 36.8  MCV 90.0 89.8  PLT 231 231   Cardiac Enzymes: No results for input(s): "CKTOTAL", "CKMB", "CKMBINDEX", "TROPONINI" in the last 168 hours. BNP: Invalid input(s): "POCBNP" CBG: No results for input(s): "GLUCAP" in the last 168 hours. HbA1C: No results for input(s): "HGBA1C" in the last 72 hours. Urine analysis:    Component Value Date/Time   COLORURINE YELLOW 01/21/2019 1629   APPEARANCEUR HAZY (A) 01/21/2019 1629    LABSPEC 1.015 01/21/2019 1629   PHURINE 7.0 01/21/2019 1629   GLUCOSEU NEGATIVE 01/21/2019 1629   HGBUR NEGATIVE 01/21/2019 1629   BILIRUBINUR NEGATIVE 01/21/2019 1629   BILIRUBINUR negative 10/27/2018 1012   KETONESUR NEGATIVE 01/21/2019 1629   PROTEINUR NEGATIVE 01/21/2019 1629   UROBILINOGEN 0.2 10/27/2018 1012   NITRITE NEGATIVE 01/21/2019 1629   LEUKOCYTESUR NEGATIVE 01/21/2019 1629   Sepsis Labs: @LABRCNTIP (procalcitonin:4,lacticidven:4) ) Recent Results (from the past 240 hours)  Resp panel by RT-PCR (RSV, Flu A&B, Covid) Anterior Nasal Swab     Status: None   Collection Time: 05/03/23  2:10 AM   Specimen: Anterior Nasal Swab  Result Value Ref Range Status   SARS Coronavirus 2 by RT PCR NEGATIVE NEGATIVE Final    Comment: (NOTE) SARS-CoV-2 target nucleic acids are NOT DETECTED.  The SARS-CoV-2 RNA is generally detectable in upper respiratory specimens during the acute phase of infection. The lowest concentration of SARS-CoV-2 viral copies this assay can detect is 138 copies/mL. A negative result does not preclude SARS-Cov-2 infection and should not be used as the sole basis for treatment or other patient management decisions. A negative result may occur with  improper specimen collection/handling, submission of specimen other than nasopharyngeal swab, presence of viral mutation(s) within the areas targeted by this assay, and inadequate number of viral copies(<138 copies/mL). A negative result must be combined with clinical observations, patient history, and epidemiological information. The expected result is Negative.  Fact Sheet for Patients:  BloggerCourse.com  Fact Sheet for Healthcare Providers:  SeriousBroker.it  This test is no t yet approved or cleared by the [place] FDA and  has been authorized for detection and/or diagnosis of SARS-CoV-2 by FDA under an Emergency Use Authorization (EUA). This EUA will  remain  in effect (meaning this test can be used) for the duration of the COVID-19 declaration under Section 564(b)(1) of the Act, 21 U.S.C.section 360bbb-3(b)(1), unless the authorization is terminated  or revoked sooner.       Influenza A by PCR NEGATIVE NEGATIVE Final   Influenza B by PCR NEGATIVE NEGATIVE Final    Comment: (NOTE) The Xpert Xpress SARS-CoV-2/FLU/RSV plus assay is intended as an aid in the diagnosis of influenza from Nasopharyngeal swab specimens and should not be used as a sole basis for treatment. Nasal washings and aspirates are unacceptable for Xpert Xpress SARS-CoV-2/FLU/RSV testing.  Fact Sheet for Patients: BloggerCourse.com  Fact Sheet for Healthcare Providers: SeriousBroker.it  This test is not yet approved or cleared by the [place] FDA and has been authorized for detection and/or diagnosis of SARS-CoV-2 by FDA under an Emergency Use Authorization (EUA). This EUA will remain in effect (meaning this test can be used) for the duration of the COVID-19 declaration under Section 564(b)(1) of the Act, 21 U.S.C. section 360bbb-3(b)(1), unless the authorization is terminated or revoked.     Resp Syncytial Virus by PCR NEGATIVE NEGATIVE Final  Comment: (NOTE) Fact Sheet for Patients: BloggerCourse.com  Fact Sheet for Healthcare Providers: SeriousBroker.it  This test is not yet approved or cleared by the [place] FDA and has been authorized for detection and/or diagnosis of SARS-CoV-2 by FDA under an Emergency Use Authorization (EUA). This EUA will remain in effect (meaning this test can be used) for the duration of the COVID-19 declaration under Section 564(b)(1) of the Act, 21 U.S.C. section 360bbb-3(b)(1), unless the authorization is terminated or revoked.  Performed at [facility], [address], [place], [place] [address]       Scheduled Meds:  [START ON 05/07/2023] albuterol  2.5 mg Nebulization Q6H   amLODipine  5 mg Oral Daily   arformoterol  15 mcg Nebulization BID   aspirin EC  81 mg Oral Q breakfast   [START ON 05/07/2023] azithromycin  500 mg Oral Daily   budesonide (PULMICORT) nebulizer solution  0.5 mg Nebulization BID   cholecalciferol  1,000 Units Oral Daily   enoxaparin (LOVENOX) injection  55 mg Subcutaneous Q24H   furosemide  40 mg Oral Daily   guaiFENesin  1,200 mg Oral BID   hydrALAZINE  10 mg Oral Q8H   ipratropium-albuterol  3 mL Nebulization TID   letrozole  2.5 mg Oral Daily   losartan  100 mg Oral Daily   methylPREDNISolone (SOLU-MEDROL) injection  60 mg Intravenous Q12H   pantoprazole  40 mg Oral QPM   [START ON 05/07/2023] revefenacin  175 mcg Nebulization Daily   rosuvastatin  5 mg Oral Daily   saccharomyces boulardii  250 mg Oral BID   venlafaxine XR  150 mg Oral Q breakfast   Continuous Infusions:  cefTRIAXone (ROCEPHIN)  IV 2 g (05/06/23 0318)    Procedures/Studies: DG Chest Portable 1 View Result Date: 05/03/2023 CLINICAL DATA:  Shortness of breath EXAM: PORTABLE CHEST 1 VIEW COMPARISON:  04/23/2023 FINDINGS: Heart and mediastinal contours are within normal limits. No focal opacities or effusions. No acute bony abnormality. IMPRESSION: No active disease. Electronically Signed   By: Charlett Nose M.D.   On: 05/03/2023 00:55   CT Angio Chest PE W/Cm &/Or Wo Cm Result Date: 04/23/2023 CLINICAL DATA:  Shortness of breath onset yesterday worsening today. Prior history of breast cancer 2022. Admitted for pneumonia last December. EXAM: CT ANGIOGRAPHY CHEST WITH CONTRAST TECHNIQUE: Multidetector CT imaging of the chest was performed using the standard protocol during bolus administration of intravenous contrast. Multiplanar CT image reconstructions and MIPs were obtained to evaluate the vascular anatomy. RADIATION DOSE REDUCTION: This exam was performed according to the departmental  dose-optimization program which includes automated exposure control, adjustment of the mA and/or kV according to patient size and/or use of iterative reconstruction technique. CONTRAST:  75mL OMNIPAQUE IOHEXOL 350 MG/ML SOLN COMPARISON:  PA Lat chest today, PA la old t chest 03/04/2023, CTA chest 01/12/2023, CTA chest 01/19/2021. FINDINGS: Cardiovascular: The cardiac size is normal. There is lipomatous hypertrophy the interatrial septum. There are patchy three-vessel coronary calcific plaques. No pericardial effusion or venous distention. The aorta is tortuous with scattered calcified plaques in the aorta and great vessels. There is no aneurysm, stenosis or dissection. The pulmonary arteries are normal caliber and do not show evidence of arterial embolism. Mediastinum/Nodes: No axillary or intrathoracic adenopathy or thyroid nodule. There are left axillary surgical clips. Unremarkable thoracic trachea, thoracic esophagus. Lungs/Pleura: There is mild diffuse bronchial thickening. Increased linear atelectasis right lower lobe. Few linear scar-like opacities both lung bases. There is no consolidation, effusion or pneumothorax, or  appreciable lung nodules. Upper Abdomen: No acute abnormality. Abdominal aortic atherosclerosis. Mild hepatic steatosis. Musculoskeletal: Multilevel thoracic spine bridging enthesopathy. Chronic left paracentral calcified disc extrusion is again noted T6-7 level displacing the thoracic cord posteriorly. No acute or other significant osseous findings. Postoperative changes again noted left breast, mild asymmetric thin skin thickening chronically seen as well as an overlying external breast prosthesis. Review of the MIP images confirms the above findings. IMPRESSION: 1. No evidence of arterial embolism or dilatation. 2. Aortic and coronary artery atherosclerosis. 3. Mild diffuse bronchial thickening. No focal pneumonia. Increased linear atelectasis right lower lobe. 4. Hepatic steatosis. 5.  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 6. Chronic postoperative changes left breast and axilla. No chest wall or axillary mass is seen. Electronically Signed   By: Almira Bar M.D.   On: 04/23/2023 22:03   DG Chest 2 View Result Date: 04/23/2023 CLINICAL DATA:  Short of breath, difficulty breathing EXAM: CHEST - 2 VIEW COMPARISON:  03/04/2023 FINDINGS: Frontal and lateral views of the chest demonstrate an unremarkable cardiac silhouette. No acute airspace disease, effusion, or pneumothorax. No acute bony abnormalities. IMPRESSION: 1. No acute intrathoracic process. Electronically Signed   By: Sharlet Salina M.D.   On: 04/23/2023 20:01    Catarina Hartshorn, DO  [facility]  If 7PM-7AM, please contact night-coverage [email or web address] Password [email or web address] 05/06/2023, 5:13 PM   LOS: 3 days

## 2023-05-07 ENCOUNTER — Inpatient Hospital Stay (HOSPITAL_COMMUNITY)

## 2023-05-07 DIAGNOSIS — J441 Chronic obstructive pulmonary disease with (acute) exacerbation: Secondary | ICD-10-CM | POA: Diagnosis not present

## 2023-05-07 DIAGNOSIS — J9601 Acute respiratory failure with hypoxia: Secondary | ICD-10-CM | POA: Diagnosis not present

## 2023-05-07 DIAGNOSIS — I10 Essential (primary) hypertension: Secondary | ICD-10-CM | POA: Diagnosis not present

## 2023-05-07 LAB — RESPIRATORY PANEL BY PCR

## 2023-05-07 LAB — BASIC METABOLIC PANEL WITH GFR
Anion gap: 10 (ref 5–15)
BUN: 32 mg/dL — ABNORMAL HIGH (ref 8–23)
CO2: 26 mmol/L (ref 22–32)
Calcium: 8.5 mg/dL — ABNORMAL LOW (ref 8.9–10.3)
Chloride: 103 mmol/L (ref 98–111)
Creatinine, Ser: 0.87 mg/dL (ref 0.44–1.00)
GFR, Estimated: >60 mL/min (ref >60)
Glucose, Bld: 152 mg/dL — ABNORMAL HIGH (ref 70–99)
Potassium: 3.8 mmol/L (ref 3.5–5.1)
Sodium: 139 mmol/L (ref 135–145)

## 2023-05-07 LAB — BRAIN NATRIURETIC PEPTIDE: B Natriuretic Peptide: 99 pg/mL (ref 0.0–100.0)

## 2023-05-07 LAB — MAGNESIUM: Magnesium: 2.7 mg/dL — ABNORMAL HIGH (ref 1.7–2.4)

## 2023-05-07 LAB — PROCALCITONIN: Procalcitonin: <0.1 ng/mL

## 2023-05-07 MED ORDER — SODIUM CHLORIDE 3 % IN NEBU
4.0000 mL | INHALATION_SOLUTION | Freq: Two times a day (BID) | RESPIRATORY_TRACT | Status: DC
  Administered 2023-05-08 – 2023-05-09 (×2): 4 mL via RESPIRATORY_TRACT
  Filled 2023-05-07 (×2): qty 4

## 2023-05-07 MED ORDER — ALBUTEROL SULFATE (2.5 MG/3ML) 0.083% IN NEBU
2.5000 mg | INHALATION_SOLUTION | Freq: Three times a day (TID) | RESPIRATORY_TRACT | Status: DC
  Administered 2023-05-08 – 2023-05-09 (×4): 2.5 mg via RESPIRATORY_TRACT
  Filled 2023-05-07 (×4): qty 3

## 2023-05-07 MED ORDER — FUROSEMIDE 10 MG/ML IJ SOLN
40.0000 mg | Freq: Once | INTRAMUSCULAR | Status: AC
  Administered 2023-05-07: 40 mg via INTRAVENOUS
  Filled 2023-05-07: qty 4

## 2023-05-07 MED ORDER — ENOXAPARIN SODIUM 60 MG/0.6ML IJ SOSY
60.0000 mg | PREFILLED_SYRINGE | INTRAMUSCULAR | Status: DC
  Administered 2023-05-08 – 2023-05-09 (×2): 60 mg via SUBCUTANEOUS
  Filled 2023-05-07 (×2): qty 0.6

## 2023-05-07 NOTE — Plan of Care (Signed)
  Problem: Education: Goal: Knowledge of General Education information will improve Description: Including pain rating scale, medication(s)/side effects and non-pharmacologic comfort measures Outcome: Progressing   Problem: Health Behavior/Discharge Planning: Goal: Ability to manage health-related needs will improve Outcome: Progressing   Problem: Clinical Measurements: Goal: Ability to maintain clinical measurements within normal limits will improve Outcome: Progressing Goal: Will remain free from infection Outcome: Progressing Goal: Diagnostic test results will improve Outcome: Progressing Goal: Respiratory complications will improve Outcome: Progressing Goal: Cardiovascular complication will be avoided Outcome: Progressing   Problem: Activity: Goal: Risk for activity intolerance will decrease Outcome: Progressing   Problem: Nutrition: Goal: Adequate nutrition will be maintained Outcome: Progressing   Problem: Coping: Goal: Level of anxiety will decrease Outcome: Progressing   Problem: Elimination: Goal: Will not experience complications related to bowel motility Outcome: Progressing Goal: Will not experience complications related to urinary retention Outcome: Progressing   Problem: Pain Managment: Goal: General experience of comfort will improve and/or be controlled Outcome: Progressing   Problem: Safety: Goal: Ability to remain free from injury will improve Outcome: Progressing   Problem: Skin Integrity: Goal: Risk for impaired skin integrity will decrease Outcome: Progressing   Problem: Activity: Goal: Will verbalize the importance of balancing activity with adequate rest periods Outcome: Progressing   Problem: Respiratory: Goal: Ability to maintain a clear airway will improve Outcome: Progressing

## 2023-05-07 NOTE — Progress Notes (Signed)
 PROGRESS NOTE  Victoria Dean OZH:[id number] DOB: April 24, 1950 DOA: 05/03/2023 PCP: Oneal Grout, FNP  Brief History:  73 y.o. Caucasian female with medical history significant for essential hypertension, COPD, hypertension, and depression, who presented to the emergency room with acute onset of dyspnea with associated dyspnea with associated wheezing as well as mildly productive cough of yellowish sputum that started earlier during the day.  She used her nebulizer with mild relief.  Pulse oximetry was in the upper 70s and therefore EMS was called.  No fever or chills.  No chest pain or palpitations.  No nausea or vomiting or abdominal pain.  No dysuria, oliguria or hematuria or flank pain.    The patient was given 2 g of IV magnesium sulfate, DuoNeb and 125 mg of IV Solu-Medrol.  She will be admitted to a medical telemetry bed for further evaluation and management.   Assessment/Plan: Acute respiratory failure with hypoxia  - initially on 3L - wean oxygen back to RA - Due to COPD exacerbation -COVID/Flu/RSV negative -viral respiratory panel -4/3--lasix 40 mg IV x 1 -CT chest   COPD exacerbation - continue duonebs - continue IV Solu-Medrol therapy with duonebs  - continue Yupelri - continue brovana - continue pulmicort -viral resp panel   Dyslipidemia - continue statin therapy.   GERD without esophagitis - continue PPI therapy.   Depression/Anxiety - continue Effexor XR.   Essential hypertension - continue losartan, amlodipine -added hydralazine   History of breast cancer - continue letrozole.   Class 3 obesity -BMI 43.93 -lifestyle modification             Family Communication:   daughter at bedside 4/3   Consultants:  none   Code Status:  DNR   DVT Prophylaxis:   [place] Lovenox     Procedures: As Listed in Progress Note Above   Antibiotics: Ceftriaxone 3/30>>4/3 Azithro 3/31>>4/4         Subjective: Pt complains of sob with minimal  exertion.  Denies f/c, n/v/d, abd pain.  Has dry cough  Objective: Vitals:   05/07/23 0849 05/07/23 0921 05/07/23 1339 05/07/23 1452  BP:  (!) 160/70 134/61   Pulse:  94 94   Resp:  18 (!) 24   Temp:  98.3 F (36.8 C) 98.2 F (36.8 C)   TempSrc:  Oral Oral   SpO2: 99% 95% 94% 93%  Weight:      Height:        Intake/Output Summary (Last 24 hours) at 05/07/2023 1802 Last data filed at 05/07/2023 1327 Gross per 24 hour  Intake 1183.33 ml  Output --  Net 1183.33 ml   Weight change:  Exam:  General:  Pt is alert, follows commands appropriately, not in acute distress HEENT: No icterus, No thrush, No neck mass, [place]/AT Cardiovascular: RRR, S1/S2, no rubs, no gallops Respiratory: bibasilar rales.  Bibasilar wheeze Abdomen: Soft/+BS, non tender, non distended, no guarding Extremities: 1 +LE edema, No lymphangitis, No petechiae, No rashes, no synovitis   Data Reviewed: I have personally reviewed following labs and imaging studies Basic Metabolic Panel: Recent Labs  Lab 05/03/23 0052 05/03/23 0417 05/07/23 0413  NA 138 137 139  K 4.2 4.3 3.8  CL 100 100 103  CO2 28 25 26   GLUCOSE 108* 127* 152*  BUN 27* 27* 32*  CREATININE 1.02* 0.96 0.87  CALCIUM 8.4* 8.5* 8.5*  MG  --   --  2.7*   Liver Function  Tests: No results for input(s): "AST", "ALT", "ALKPHOS", "BILITOT", "PROT", "ALBUMIN" in the last 168 hours. No results for input(s): "LIPASE", "AMYLASE" in the last 168 hours. No results for input(s): "AMMONIA" in the last 168 hours. Coagulation Profile: No results for input(s): "INR", "PROTIME" in the last 168 hours. CBC: Recent Labs  Lab 05/03/23 0052 05/03/23 0417  WBC 11.8* 12.1*  NEUTROABS 8.7*  --   HGB 10.8* 11.4*  HCT 35.0* 36.8  MCV 90.0 89.8  PLT 231 231   Cardiac Enzymes: No results for input(s): "CKTOTAL", "CKMB", "CKMBINDEX", "TROPONINI" in the last 168 hours. BNP: Invalid input(s): "POCBNP" CBG: No results for input(s): "GLUCAP" in the last 168  hours. HbA1C: No results for input(s): "HGBA1C" in the last 72 hours. Urine analysis:    Component Value Date/Time   COLORURINE YELLOW 01/21/2019 1629   APPEARANCEUR HAZY (A) 01/21/2019 1629   LABSPEC 1.015 01/21/2019 1629   PHURINE 7.0 01/21/2019 1629   GLUCOSEU NEGATIVE 01/21/2019 1629   HGBUR NEGATIVE 01/21/2019 1629   BILIRUBINUR NEGATIVE 01/21/2019 1629   BILIRUBINUR negative 10/27/2018 1012   KETONESUR NEGATIVE 01/21/2019 1629   PROTEINUR NEGATIVE 01/21/2019 1629   UROBILINOGEN 0.2 10/27/2018 1012   NITRITE NEGATIVE 01/21/2019 1629   LEUKOCYTESUR NEGATIVE 01/21/2019 1629   Sepsis Labs: @LABRCNTIP (procalcitonin:4,lacticidven:4) ) Recent Results (from the past 240 hours)  Resp panel by RT-PCR (RSV, Flu A&B, Covid) Anterior Nasal Swab     Status: None   Collection Time: 05/03/23  2:10 AM   Specimen: Anterior Nasal Swab  Result Value Ref Range Status   SARS Coronavirus 2 by RT PCR NEGATIVE NEGATIVE Final    Comment: (NOTE) SARS-CoV-2 target nucleic acids are NOT DETECTED.  The SARS-CoV-2 RNA is generally detectable in upper respiratory specimens during the acute phase of infection. The lowest concentration of SARS-CoV-2 viral copies this assay can detect is 138 copies/mL. A negative result does not preclude SARS-Cov-2 infection and should not be used as the sole basis for treatment or other patient management decisions. A negative result may occur with  improper specimen collection/handling, submission of specimen other than nasopharyngeal swab, presence of viral mutation(s) within the areas targeted by this assay, and inadequate number of viral copies(<138 copies/mL). A negative result must be combined with clinical observations, patient history, and epidemiological information. The expected result is Negative.  Fact Sheet for Patients:  BloggerCourse.com  Fact Sheet for Healthcare Providers:   SeriousBroker.it  This test is no t yet approved or cleared by the [place] FDA and  has been authorized for detection and/or diagnosis of SARS-CoV-2 by FDA under an Emergency Use Authorization (EUA). This EUA will remain  in effect (meaning this test can be used) for the duration of the COVID-19 declaration under Section 564(b)(1) of the Act, 21 U.S.C.section 360bbb-3(b)(1), unless the authorization is terminated  or revoked sooner.       Influenza A by PCR NEGATIVE NEGATIVE Final   Influenza B by PCR NEGATIVE NEGATIVE Final    Comment: (NOTE) The Xpert Xpress SARS-CoV-2/FLU/RSV plus assay is intended as an aid in the diagnosis of influenza from Nasopharyngeal swab specimens and should not be used as a sole basis for treatment. Nasal washings and aspirates are unacceptable for Xpert Xpress SARS-CoV-2/FLU/RSV testing.  Fact Sheet for Patients: BloggerCourse.com  Fact Sheet for Healthcare Providers: SeriousBroker.it  This test is not yet approved or cleared by the [place] FDA and has been authorized for detection and/or diagnosis of SARS-CoV-2 by FDA under an Emergency Use Authorization (  EUA). This EUA will remain in effect (meaning this test can be used) for the duration of the COVID-19 declaration under Section 564(b)(1) of the Act, 21 U.S.C. section 360bbb-3(b)(1), unless the authorization is terminated or revoked.     Resp Syncytial Virus by PCR NEGATIVE NEGATIVE Final    Comment: (NOTE) Fact Sheet for Patients: BloggerCourse.com  Fact Sheet for Healthcare Providers: SeriousBroker.it  This test is not yet approved or cleared by the [place] FDA and has been authorized for detection and/or diagnosis of SARS-CoV-2 by FDA under an Emergency Use Authorization (EUA). This EUA will remain in effect (meaning this test can be used) for  the duration of the COVID-19 declaration under Section 564(b)(1) of the Act, 21 U.S.C. section 360bbb-3(b)(1), unless the authorization is terminated or revoked.  Performed at [facility], [address] [place], [place] [address]      Scheduled Meds:  albuterol  2.5 mg Nebulization Q6H   amLODipine  5 mg Oral Daily   arformoterol  15 mcg Nebulization BID   aspirin EC  81 mg Oral Q breakfast   azithromycin  500 mg Oral Daily   budesonide (PULMICORT) nebulizer solution  0.5 mg Nebulization BID   cholecalciferol  1,000 Units Oral Daily   [START ON 05/08/2023] enoxaparin (LOVENOX) injection  60 mg Subcutaneous Q24H   guaiFENesin  1,200 mg Oral BID   hydrALAZINE  10 mg Oral Q8H   letrozole  2.5 mg Oral Daily   losartan  100 mg Oral Daily   methylPREDNISolone (SOLU-MEDROL) injection  60 mg Intravenous Q12H   pantoprazole  40 mg Oral QPM   revefenacin  175 mcg Nebulization Daily   rosuvastatin  5 mg Oral Daily   saccharomyces boulardii  250 mg Oral BID   sodium chloride HYPERTONIC  4 mL Nebulization BID   venlafaxine XR  150 mg Oral Q breakfast   Continuous Infusions:  Procedures/Studies: DG Chest Portable 1 View Result Date: 05/03/2023 CLINICAL DATA:  Shortness of breath EXAM: PORTABLE CHEST 1 VIEW COMPARISON:  04/23/2023 FINDINGS: Heart and mediastinal contours are within normal limits. No focal opacities or effusions. No acute bony abnormality. IMPRESSION: No active disease. Electronically Signed   By: Charlett Nose M.D.   On: 05/03/2023 00:55   CT Angio Chest PE W/Cm &/Or Wo Cm Result Date: 04/23/2023 CLINICAL DATA:  Shortness of breath onset yesterday worsening today. Prior history of breast cancer 2022. Admitted for pneumonia last December. EXAM: CT ANGIOGRAPHY CHEST WITH CONTRAST TECHNIQUE: Multidetector CT imaging of the chest was performed using the standard protocol during bolus administration of intravenous contrast. Multiplanar CT image reconstructions and MIPs were obtained  to evaluate the vascular anatomy. RADIATION DOSE REDUCTION: This exam was performed according to the departmental dose-optimization program which includes automated exposure control, adjustment of the mA and/or kV according to patient size and/or use of iterative reconstruction technique. CONTRAST:  75mL OMNIPAQUE IOHEXOL 350 MG/ML SOLN COMPARISON:  PA Lat chest today, PA la old t chest 03/04/2023, CTA chest 01/12/2023, CTA chest 01/19/2021. FINDINGS: Cardiovascular: The cardiac size is normal. There is lipomatous hypertrophy the interatrial septum. There are patchy three-vessel coronary calcific plaques. No pericardial effusion or venous distention. The aorta is tortuous with scattered calcified plaques in the aorta and great vessels. There is no aneurysm, stenosis or dissection. The pulmonary arteries are normal caliber and do not show evidence of arterial embolism. Mediastinum/Nodes: No axillary or intrathoracic adenopathy or thyroid nodule. There are left axillary surgical clips. Unremarkable thoracic trachea, thoracic  esophagus. Lungs/Pleura: There is mild diffuse bronchial thickening. Increased linear atelectasis right lower lobe. Few linear scar-like opacities both lung bases. There is no consolidation, effusion or pneumothorax, or appreciable lung nodules. Upper Abdomen: No acute abnormality. Abdominal aortic atherosclerosis. Mild hepatic steatosis. Musculoskeletal: Multilevel thoracic spine bridging enthesopathy. Chronic left paracentral calcified disc extrusion is again noted T6-7 level displacing the thoracic cord posteriorly. No acute or other significant osseous findings. Postoperative changes again noted left breast, mild asymmetric thin skin thickening chronically seen as well as an overlying external breast prosthesis. Review of the MIP images confirms the above findings. IMPRESSION: 1. No evidence of arterial embolism or dilatation. 2. Aortic and coronary artery atherosclerosis. 3. Mild diffuse  bronchial thickening. No focal pneumonia. Increased linear atelectasis right lower lobe. 4. Hepatic steatosis. 5. 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 6. Chronic postoperative changes left breast and axilla. No chest wall or axillary mass is seen. Electronically Signed   By: Almira Bar M.D.   On: 04/23/2023 22:03   DG Chest 2 View Result Date: 04/23/2023 CLINICAL DATA:  Short of breath, difficulty breathing EXAM: CHEST - 2 VIEW COMPARISON:  03/04/2023 FINDINGS: Frontal and lateral views of the chest demonstrate an unremarkable cardiac silhouette. No acute airspace disease, effusion, or pneumothorax. No acute bony abnormalities. IMPRESSION: 1. No acute intrathoracic process. Electronically Signed   By: Sharlet Salina M.D.   On: 04/23/2023 20:01    Catarina Hartshorn, DO  [facility]  If 7PM-7AM, please contact night-coverage [email or web address] Password [email or web address] 05/07/2023, 6:02 PM   LOS: 4 days

## 2023-05-08 DIAGNOSIS — J441 Chronic obstructive pulmonary disease with (acute) exacerbation: Secondary | ICD-10-CM | POA: Diagnosis not present

## 2023-05-08 DIAGNOSIS — J9601 Acute respiratory failure with hypoxia: Secondary | ICD-10-CM | POA: Diagnosis not present

## 2023-05-08 DIAGNOSIS — I10 Essential (primary) hypertension: Secondary | ICD-10-CM | POA: Diagnosis not present

## 2023-05-08 LAB — BASIC METABOLIC PANEL WITH GFR
Anion gap: 10 (ref 5–15)
BUN: 32 mg/dL — ABNORMAL HIGH (ref 8–23)
CO2: 26 mmol/L (ref 22–32)
Calcium: 8.1 mg/dL — ABNORMAL LOW (ref 8.9–10.3)
Chloride: 101 mmol/L (ref 98–111)
Creatinine, Ser: 1.06 mg/dL — ABNORMAL HIGH (ref 0.44–1.00)
GFR, Estimated: 55 mL/min — ABNORMAL LOW (ref >60)
Glucose, Bld: 176 mg/dL — ABNORMAL HIGH (ref 70–99)
Potassium: 3.9 mmol/L (ref 3.5–5.1)
Sodium: 137 mmol/L (ref 135–145)

## 2023-05-08 LAB — VITAMIN B12: Vitamin B-12: 546 pg/mL (ref 180–914)

## 2023-05-08 LAB — CBC
HCT: 35.3 % — ABNORMAL LOW (ref 36.0–46.0)
Hemoglobin: 11.1 g/dL — ABNORMAL LOW (ref 12.0–15.0)
MCH: 27.8 pg (ref 26.0–34.0)
MCHC: 31.4 g/dL (ref 30.0–36.0)
MCV: 88.5 fL (ref 80.0–100.0)
Platelets: 220 10*3/uL (ref 150–400)
RBC: 3.99 MIL/uL (ref 3.87–5.11)
RDW: 16.9 % — ABNORMAL HIGH (ref 11.5–15.5)
WBC: 11.6 10*3/uL — ABNORMAL HIGH (ref 4.0–10.5)
nRBC: 0.4 % — ABNORMAL HIGH (ref 0.0–0.2)

## 2023-05-08 LAB — FOLATE: Folate: 5.7 ng/mL — ABNORMAL LOW (ref >5.9)

## 2023-05-08 LAB — T4, FREE: Free T4: 0.92 ng/dL (ref 0.61–1.12)

## 2023-05-08 LAB — TSH: TSH: 0.134 u[IU]/mL — ABNORMAL LOW (ref 0.350–4.500)

## 2023-05-08 MED ORDER — ALPRAZOLAM 0.25 MG PO TABS
0.2500 mg | ORAL_TABLET | Freq: Three times a day (TID) | ORAL | Status: DC
Start: 2023-05-08 — End: 2023-05-08

## 2023-05-08 MED ORDER — ALPRAZOLAM 0.25 MG PO TABS
0.2500 mg | ORAL_TABLET | Freq: Three times a day (TID) | ORAL | Status: DC
  Administered 2023-05-08 – 2023-05-09 (×2): 0.25 mg via ORAL
  Filled 2023-05-08 (×2): qty 1

## 2023-05-08 MED ORDER — FOLIC ACID 1 MG PO TABS
1.0000 mg | ORAL_TABLET | Freq: Every day | ORAL | Status: DC
  Administered 2023-05-08 – 2023-05-09 (×2): 1 mg via ORAL
  Filled 2023-05-08 (×2): qty 1

## 2023-05-08 MED ORDER — FUROSEMIDE 40 MG PO TABS
40.0000 mg | ORAL_TABLET | Freq: Every day | ORAL | Status: DC
  Administered 2023-05-08 – 2023-05-09 (×2): 40 mg via ORAL
  Filled 2023-05-08 (×2): qty 1

## 2023-05-08 NOTE — Progress Notes (Signed)
 PROGRESS NOTE  Victoria Dean ZOX:[id number] DOB: Oct 09, 1950 DOA: 05/03/2023 PCP: Oneal Grout, FNP  Brief History:  73 y.o. Caucasian female with medical history significant for essential hypertension, COPD, hypertension, and depression, who presented to the emergency room with acute onset of dyspnea with associated dyspnea with associated wheezing as well as mildly productive cough of yellowish sputum that started earlier during the day.  She used her nebulizer with mild relief.  Pulse oximetry was in the upper 70s and therefore EMS was called.  No fever or chills.  No chest pain or palpitations.  No nausea or vomiting or abdominal pain.  No dysuria, oliguria or hematuria or flank pain.    The patient was given 2 g of IV magnesium sulfate, DuoNeb and 125 mg of IV Solu-Medrol.  She will be admitted to a medical telemetry bed for further evaluation and management.   Assessment/Plan: Acute respiratory failure with hypoxia  - initially on 3L - wean oxygen back to RA - Due to COPD exacerbation -COVID/Flu/RSV negative -viral respiratory panel+Rhinovirus/enterovirus -4/3--lasix 40 mg IV x 1 -CT chest--stable chronic peribronchial thickening in bilateral LL.  No acute findings   COPD exacerbation - continue duonebs - continue IV Solu-Medrol therapy with duonebs  - continue Yupelri - continue brovana - continue pulmicort -viral resp panel   Dyslipidemia - continue statin therapy.   GERD without esophagitis - continue PPI therapy.   Depression/Anxiety - continue Effexor XR. - add short term alprazolam   Essential hypertension - continue losartan, amlodipine -added hydralazine   History of breast cancer - continue letrozole.   Class 3 obesity -BMI 43.93 -lifestyle modification             Family Communication:   daughter at bedside 4/4   Consultants:  none   Code Status:  DNR   DVT Prophylaxis:   [place] Lovenox     Procedures: As Listed in Progress Note  Above   Antibiotics: Ceftriaxone 3/30>>4/3 Azithro 3/31>>4/4         Subjective: Pt is slowing breathing better, but has sob with minimal exertion.  Denies f/c, n/v/d, abd pain  Objective: Vitals:   05/08/23 0529 05/08/23 0924 05/08/23 1359 05/08/23 1521  BP: (!) 162/70  (!) 144/62   Pulse:   98   Resp:      Temp:   97.8 F (36.6 C)   TempSrc:   Oral   SpO2:  94% 91% 94%  Weight:      Height:        Intake/Output Summary (Last 24 hours) at 05/08/2023 1713 Last data filed at 05/08/2023 1300 Gross per 24 hour  Intake 480 ml  Output --  Net 480 ml   Weight change:  Exam:  General:  Pt is alert, follows commands appropriately, not in acute distress HEENT: No icterus, No thrush, No neck mass, [place]/AT Cardiovascular: RRR, S1/S2, no rubs, no gallops Respiratory: bibasilar rales.  Minimal basilar wheeze Abdomen: Soft/+BS, non tender, non distended, no guarding Extremities: trace LE edema, No lymphangitis, No petechiae, No rashes, no synovitis   Data Reviewed: I have personally reviewed following labs and imaging studies Basic Metabolic Panel: Recent Labs  Lab 05/03/23 0052 05/03/23 0417 05/07/23 0413 05/08/23 0416  NA 138 137 139 137  K 4.2 4.3 3.8 3.9  CL 100 100 103 101  CO2 28 25 26 26   GLUCOSE 108* 127* 152* 176*  BUN 27* 27* 32* 32*  CREATININE  1.02* 0.96 0.87 1.06*  CALCIUM 8.4* 8.5* 8.5* 8.1*  MG  --   --  2.7*  --    Liver Function Tests: No results for input(s): "AST", "ALT", "ALKPHOS", "BILITOT", "PROT", "ALBUMIN" in the last 168 hours. No results for input(s): "LIPASE", "AMYLASE" in the last 168 hours. No results for input(s): "AMMONIA" in the last 168 hours. Coagulation Profile: No results for input(s): "INR", "PROTIME" in the last 168 hours. CBC: Recent Labs  Lab 05/03/23 0052 05/03/23 0417 05/08/23 0416  WBC 11.8* 12.1* 11.6*  NEUTROABS 8.7*  --   --   HGB 10.8* 11.4* 11.1*  HCT 35.0* 36.8 35.3*  MCV 90.0 89.8 88.5  PLT 231 231 220    Cardiac Enzymes: No results for input(s): "CKTOTAL", "CKMB", "CKMBINDEX", "TROPONINI" in the last 168 hours. BNP: Invalid input(s): "POCBNP" CBG: No results for input(s): "GLUCAP" in the last 168 hours. HbA1C: No results for input(s): "HGBA1C" in the last 72 hours. Urine analysis:    Component Value Date/Time   COLORURINE YELLOW 01/21/2019 1629   APPEARANCEUR HAZY (A) 01/21/2019 1629   LABSPEC 1.015 01/21/2019 1629   PHURINE 7.0 01/21/2019 1629   GLUCOSEU NEGATIVE 01/21/2019 1629   HGBUR NEGATIVE 01/21/2019 1629   BILIRUBINUR NEGATIVE 01/21/2019 1629   BILIRUBINUR negative 10/27/2018 1012   KETONESUR NEGATIVE 01/21/2019 1629   PROTEINUR NEGATIVE 01/21/2019 1629   UROBILINOGEN 0.2 10/27/2018 1012   NITRITE NEGATIVE 01/21/2019 1629   LEUKOCYTESUR NEGATIVE 01/21/2019 1629   Sepsis Labs: @LABRCNTIP (procalcitonin:4,lacticidven:4) ) Recent Results (from the past 240 hours)  Resp panel by RT-PCR (RSV, Flu A&B, Covid) Anterior Nasal Swab     Status: None   Collection Time: 05/03/23  2:10 AM   Specimen: Anterior Nasal Swab  Result Value Ref Range Status   SARS Coronavirus 2 by RT PCR NEGATIVE NEGATIVE Final    Comment: (NOTE) SARS-CoV-2 target nucleic acids are NOT DETECTED.  The SARS-CoV-2 RNA is generally detectable in upper respiratory specimens during the acute phase of infection. The lowest concentration of SARS-CoV-2 viral copies this assay can detect is 138 copies/mL. A negative result does not preclude SARS-Cov-2 infection and should not be used as the sole basis for treatment or other patient management decisions. A negative result may occur with  improper specimen collection/handling, submission of specimen other than nasopharyngeal swab, presence of viral mutation(s) within the areas targeted by this assay, and inadequate number of viral copies(<138 copies/mL). A negative result must be combined with clinical observations, patient history, and  epidemiological information. The expected result is Negative.  Fact Sheet for Patients:  BloggerCourse.com  Fact Sheet for Healthcare Providers:  SeriousBroker.it  This test is no t yet approved or cleared by the [place] FDA and  has been authorized for detection and/or diagnosis of SARS-CoV-2 by FDA under an Emergency Use Authorization (EUA). This EUA will remain  in effect (meaning this test can be used) for the duration of the COVID-19 declaration under Section 564(b)(1) of the Act, 21 U.S.C.section 360bbb-3(b)(1), unless the authorization is terminated  or revoked sooner.       Influenza A by PCR NEGATIVE NEGATIVE Final   Influenza B by PCR NEGATIVE NEGATIVE Final    Comment: (NOTE) The Xpert Xpress SARS-CoV-2/FLU/RSV plus assay is intended as an aid in the diagnosis of influenza from Nasopharyngeal swab specimens and should not be used as a sole basis for treatment. Nasal washings and aspirates are unacceptable for Xpert Xpress SARS-CoV-2/FLU/RSV testing.  Fact Sheet for Patients: BloggerCourse.com  Fact  Sheet for Healthcare Providers: SeriousBroker.it  This test is not yet approved or cleared by the [place] and has been authorized for detection and/or diagnosis of SARS-CoV-2 by FDA under an Emergency Use Authorization (EUA). This EUA will remain in effect (meaning this test can be used) for the duration of the COVID-19 declaration under Section 564(b)(1) of the Act, 21 U.S.C. section 360bbb-3(b)(1), unless the authorization is terminated or revoked.     Resp Syncytial Virus by PCR NEGATIVE NEGATIVE Final    Comment: (NOTE) Fact Sheet for Patients: BloggerCourse.com  Fact Sheet for Healthcare Providers: SeriousBroker.it  This test is not yet approved or cleared by the [place] FDA and has been  authorized for detection and/or diagnosis of SARS-CoV-2 by FDA under an Emergency Use Authorization (EUA). This EUA will remain in effect (meaning this test can be used) for the duration of the COVID-19 declaration under Section 564(b)(1) of the Act, 21 U.S.C. section 360bbb-3(b)(1), unless the authorization is terminated or revoked.  Performed at [facility], [address], [place], [place] [address]   Respiratory (~20 pathogens) panel by PCR     Status: Abnormal   Collection Time: 05/07/23  3:20 PM   Specimen: Nasopharyngeal Swab; Respiratory  Result Value Ref Range Status   Adenovirus NOT DETECTED NOT DETECTED Final   Coronavirus 229E NOT DETECTED NOT DETECTED Final    Comment: (NOTE) The Coronavirus on the Respiratory Panel, DOES NOT test for the novel  Coronavirus (2019 nCoV)    Coronavirus HKU1 NOT DETECTED NOT DETECTED Final   Coronavirus NL63 NOT DETECTED NOT DETECTED Final   Coronavirus OC43 NOT DETECTED NOT DETECTED Final   Metapneumovirus NOT DETECTED NOT DETECTED Final   Rhinovirus / Enterovirus DETECTED (A) NOT DETECTED Final   Influenza A NOT DETECTED NOT DETECTED Final   Influenza B NOT DETECTED NOT DETECTED Final   Parainfluenza Virus 1 NOT DETECTED NOT DETECTED Final   Parainfluenza Virus 2 NOT DETECTED NOT DETECTED Final   Parainfluenza Virus 3 NOT DETECTED NOT DETECTED Final   Parainfluenza Virus 4 NOT DETECTED NOT DETECTED Final   Respiratory Syncytial Virus NOT DETECTED NOT DETECTED Final   Bordetella pertussis NOT DETECTED NOT DETECTED Final   Bordetella Parapertussis NOT DETECTED NOT DETECTED Final   Chlamydophila pneumoniae NOT DETECTED NOT DETECTED Final   Mycoplasma pneumoniae NOT DETECTED NOT DETECTED Final    Comment: Performed at [facility], [address], [place], [place] [address]     Scheduled Meds:  albuterol  2.5 mg Nebulization TID   ALPRAZolam  0.25 mg Oral TID   amLODipine  5 mg Oral Daily   arformoterol  15 mcg Nebulization BID    aspirin EC  81 mg Oral Q breakfast   budesonide (PULMICORT) nebulizer solution  0.5 mg Nebulization BID   cholecalciferol  1,000 Units Oral Daily   enoxaparin (LOVENOX) injection  60 mg Subcutaneous Q24H   guaiFENesin  1,200 mg Oral BID   hydrALAZINE  10 mg Oral Q8H   letrozole  2.5 mg Oral Daily   losartan  100 mg Oral Daily   methylPREDNISolone (SOLU-MEDROL) injection  60 mg Intravenous Q12H   pantoprazole  40 mg Oral QPM   revefenacin  175 mcg Nebulization Daily   rosuvastatin  5 mg Oral Daily   saccharomyces boulardii  250 mg Oral BID   sodium chloride HYPERTONIC  4 mL Nebulization BID   venlafaxine XR  150 mg Oral Q breakfast   Continuous Infusions:  Procedures/Studies:  CT CHEST WO CONTRAST Result Date: 05/08/2023 CLINICAL DATA:  Shortness of breath EXAM: CT CHEST WITHOUT CONTRAST TECHNIQUE: Multidetector CT imaging of the chest was performed following the standard protocol without IV contrast. RADIATION DOSE REDUCTION: This exam was performed according to the departmental dose-optimization program which includes automated exposure control, adjustment of the mA and/or kV according to patient size and/or use of iterative reconstruction technique. COMPARISON:  04/23/2023 FINDINGS: Cardiovascular: Heart is normal size. Aorta is normal caliber. Coronary artery and aortic atherosclerosis. Mediastinum/Nodes: No mediastinal, hilar, or axillary adenopathy. Trachea and esophagus are unremarkable. Thyroid unremarkable. Lungs/Pleura: Mild airway thickening in the lower lobes, similar to prior study. Linear atelectasis or scarring in the lung bases. No confluent opacities or effusions. Upper Abdomen: No acute findings Musculoskeletal: No acute bony abnormality. Chest wall soft tissues are unremarkable. Postoperative changes in the left breast. IMPRESSION: No acute cardiopulmonary disease. Stable chronic peribronchial thickening in the lower lobes. Bibasilar scarring or atelectasis. Coronary artery  disease. Aortic Atherosclerosis (ICD10-I70.0). Electronically Signed   By: Charlett Nose M.D.   On: 05/08/2023 00:01   DG Chest Portable 1 View Result Date: 05/03/2023 CLINICAL DATA:  Shortness of breath EXAM: PORTABLE CHEST 1 VIEW COMPARISON:  04/23/2023 FINDINGS: Heart and mediastinal contours are within normal limits. No focal opacities or effusions. No acute bony abnormality. IMPRESSION: No active disease. Electronically Signed   By: Charlett Nose M.D.   On: 05/03/2023 00:55   CT Angio Chest PE W/Cm &/Or Wo Cm Result Date: 04/23/2023 CLINICAL DATA:  Shortness of breath onset yesterday worsening today. Prior history of breast cancer 2022. Admitted for pneumonia last December. EXAM: CT ANGIOGRAPHY CHEST WITH CONTRAST TECHNIQUE: Multidetector CT imaging of the chest was performed using the standard protocol during bolus administration of intravenous contrast. Multiplanar CT image reconstructions and MIPs were obtained to evaluate the vascular anatomy. RADIATION DOSE REDUCTION: This exam was performed according to the departmental dose-optimization program which includes automated exposure control, adjustment of the mA and/or kV according to patient size and/or use of iterative reconstruction technique. CONTRAST:  75mL OMNIPAQUE IOHEXOL 350 MG/ML SOLN COMPARISON:  PA Lat chest today, PA la old t chest 03/04/2023, CTA chest 01/12/2023, CTA chest 01/19/2021. FINDINGS: Cardiovascular: The cardiac size is normal. There is lipomatous hypertrophy the interatrial septum. There are patchy three-vessel coronary calcific plaques. No pericardial effusion or venous distention. The aorta is tortuous with scattered calcified plaques in the aorta and great vessels. There is no aneurysm, stenosis or dissection. The pulmonary arteries are normal caliber and do not show evidence of arterial embolism. Mediastinum/Nodes: No axillary or intrathoracic adenopathy or thyroid nodule. There are left axillary surgical clips. Unremarkable  thoracic trachea, thoracic esophagus. Lungs/Pleura: There is mild diffuse bronchial thickening. Increased linear atelectasis right lower lobe. Few linear scar-like opacities both lung bases. There is no consolidation, effusion or pneumothorax, or appreciable lung nodules. Upper Abdomen: No acute abnormality. Abdominal aortic atherosclerosis. Mild hepatic steatosis. Musculoskeletal: Multilevel thoracic spine bridging enthesopathy. Chronic left paracentral calcified disc extrusion is again noted T6-7 level displacing the thoracic cord posteriorly. No acute or other significant osseous findings. Postoperative changes again noted left breast, mild asymmetric thin skin thickening chronically seen as well as an overlying external breast prosthesis. Review of the MIP images confirms the above findings. IMPRESSION: 1. No evidence of arterial embolism or dilatation. 2. Aortic and coronary artery atherosclerosis. 3. Mild diffuse bronchial thickening. No focal pneumonia. Increased linear atelectasis right lower lobe. 4. Hepatic steatosis. 5. Chronic left paracentral calcified disc extrusion  T6-7 displacing the thoracic cord posteriorly. 6. Chronic postoperative changes left breast and axilla. No chest wall or axillary mass is seen. Electronically Signed   By: Almira Bar M.D.   On: 04/23/2023 22:03   DG Chest 2 View Result Date: 04/23/2023 CLINICAL DATA:  Short of breath, difficulty breathing EXAM: CHEST - 2 VIEW COMPARISON:  03/04/2023 FINDINGS: Frontal and lateral views of the chest demonstrate an unremarkable cardiac silhouette. No acute airspace disease, effusion, or pneumothorax. No acute bony abnormalities. IMPRESSION: 1. No acute intrathoracic process. Electronically Signed   By: Sharlet Salina M.D.   On: 04/23/2023 20:01    Catarina Hartshorn, DO  [facility]  If 7PM-7AM, please contact night-coverage [email or web address] Password [email or web address] 05/08/2023, 5:13 PM   LOS: 5 days

## 2023-05-08 NOTE — Care Management Important Message (Signed)
 Important Message  Patient Details  Name: Victoria Dean MRN: [id number] Date of Birth: 02/14/1950   Important Message Given:  Yes - Medicare IM     Corey Harold 05/08/2023, 4:54 PM

## 2023-05-08 NOTE — Progress Notes (Signed)
 Mobility Specialist Progress Note:    05/08/23 1105  Mobility  Activity Ambulated with assistance in hallway  Level of Assistance Standby assist, set-up cues, supervision of patient - no hands on  Assistive Device Cane  Distance Ambulated (ft) 40 ft  Range of Motion/Exercises Active;All extremities  Activity Response Tolerated well  Mobility Referral Yes  Mobility visit 1 Mobility  Mobility Specialist Start Time (ACUTE ONLY) 1105  Mobility Specialist Stop Time (ACUTE ONLY) 1130  Mobility Specialist Time Calculation (min) (ACUTE ONLY) 25 min   Pt received in bed, agreeable to mobility. Required supervision to stand and ambulate with cane. Tolerated well, SpO2 95% on RA at rest. SpO2 90-93% on RA during ambulation. Left pt sitting EOB, all needs met.  Kinneth Fujiwara Mobility Specialist Please contact via Special educational needs teacher or  Rehab office at [phone]

## 2023-05-08 NOTE — Plan of Care (Signed)
 ?  Problem: Clinical Measurements: ?Goal: Ability to maintain clinical measurements within normal limits will improve ?Outcome: Progressing ?Goal: Will remain free from infection ?Outcome: Progressing ?Goal: Diagnostic test results will improve ?Outcome: Progressing ?  ?

## 2023-05-09 DIAGNOSIS — J441 Chronic obstructive pulmonary disease with (acute) exacerbation: Secondary | ICD-10-CM | POA: Diagnosis not present

## 2023-05-09 DIAGNOSIS — J9601 Acute respiratory failure with hypoxia: Secondary | ICD-10-CM | POA: Diagnosis not present

## 2023-05-09 DIAGNOSIS — I10 Essential (primary) hypertension: Secondary | ICD-10-CM | POA: Diagnosis not present

## 2023-05-09 LAB — PHOSPHORUS: Phosphorus: 3.4 mg/dL (ref 2.5–4.6)

## 2023-05-09 LAB — BASIC METABOLIC PANEL WITH GFR
Anion gap: 9 (ref 5–15)
BUN: 37 mg/dL — ABNORMAL HIGH (ref 8–23)
CO2: 27 mmol/L (ref 22–32)
Calcium: 8.5 mg/dL — ABNORMAL LOW (ref 8.9–10.3)
Chloride: 101 mmol/L (ref 98–111)
Creatinine, Ser: 0.98 mg/dL (ref 0.44–1.00)
GFR, Estimated: >60 mL/min (ref >60)
Glucose, Bld: 132 mg/dL — ABNORMAL HIGH (ref 70–99)
Potassium: 3.7 mmol/L (ref 3.5–5.1)
Sodium: 137 mmol/L (ref 135–145)

## 2023-05-09 LAB — MAGNESIUM: Magnesium: 2.5 mg/dL — ABNORMAL HIGH (ref 1.7–2.4)

## 2023-05-09 MED ORDER — HYDRALAZINE HCL 10 MG PO TABS: 10.0000 mg | ORAL_TABLET | Freq: Three times a day (TID) | ORAL | 1 refills | Status: DC

## 2023-05-09 MED ORDER — IPRATROPIUM-ALBUTEROL 0.5-2.5 (3) MG/3ML IN SOLN: 3.0000 mL | Freq: Three times a day (TID) | RESPIRATORY_TRACT | 1 refills | Status: AC

## 2023-05-09 MED ORDER — ALPRAZOLAM 0.25 MG PO TABS: 0.2500 mg | ORAL_TABLET | Freq: Three times a day (TID) | ORAL | 0 refills | Status: AC

## 2023-05-09 MED ORDER — IPRATROPIUM-ALBUTEROL 0.5-2.5 (3) MG/3ML IN SOLN: 3.0000 mL | Freq: Three times a day (TID) | RESPIRATORY_TRACT | Status: DC

## 2023-05-09 MED ORDER — PREDNISONE 10 MG PO TABS: 60.0000 mg | ORAL_TABLET | Freq: Every day | ORAL | 0 refills | Status: AC

## 2023-05-09 MED ORDER — PREDNISONE 20 MG PO TABS: 60.0000 mg | ORAL_TABLET | Freq: Every day | ORAL | Status: DC

## 2023-05-09 MED ORDER — FOLIC ACID 1 MG PO TABS: 1.0000 mg | ORAL_TABLET | Freq: Every day | ORAL | Status: AC

## 2023-05-09 NOTE — Progress Notes (Signed)
   05/09/23 1240  Discharge Planning  Type of Residence Private residence  Once discharged, how will the patient get to their discharge location? Self/Private Vehicle  Once discharged, how will the patient get to their follow-up appointment? Family/Friend - Partnered Transport  Expected Discharge Date 05/09/23   Discharge  with oxygen. Oxygen provider [facility].

## 2023-05-09 NOTE — Progress Notes (Signed)
SATURATION QUALIFICATIONS: (This note is used to comply with regulatory documentation for home oxygen)   Patient Saturations on Room Air at Rest = 91   Patient Saturations on Room Air while Ambulating = 82   Patient Saturations on 2 Liters of oxygen while Ambulating = 94   Please briefly explain why patient needs home oxygen: To maintain 02 sat at 90% or above during ambulation.   Catarina Hartshorn, DO

## 2023-05-09 NOTE — Discharge Summary (Signed)
 Physician Discharge Summary   Patient: Victoria Dean MRN: [id number] DOB: 1950-10-15  Admit date:     05/03/2023  Discharge date: 05/09/23  Discharge Physician: Onalee Hua Arley Garant   PCP: Oneal Grout, FNP   Recommendations at discharge:   Please follow up with primary care provider within 1-2 weeks  Please repeat BMP and CBC in one week      Hospital Course: 73 y.o. Caucasian female with medical history significant for essential hypertension, COPD, hypertension, and depression, who presented to the emergency room with acute onset of dyspnea with associated dyspnea with associated wheezing as well as mildly productive cough of yellowish sputum that started earlier during the day.  She used her nebulizer with mild relief.  Pulse oximetry was in the upper 70s and therefore EMS was called.  No fever or chills.  No chest pain or palpitations.  No nausea or vomiting or abdominal pain.  No dysuria, oliguria or hematuria or flank pain.    The patient was given 2 g of IV magnesium sulfate, DuoNeb and 125 mg of IV Solu-Medrol.  She will be admitted to a medical telemetry bed for further evaluation and management.  Assessment and Plan: Acute respiratory failure with hypoxia  - initially on 3L - wean oxygen back to RA - Due to COPD exacerbation -COVID/Flu/RSV negative -viral respiratory panel+Rhinovirus/enterovirus -4/3--lasix 40 mg IV x 1, then resume home dose po lasix -CT chest--stable chronic peribronchial thickening in bilateral LL.  No acute findings -05/09/23--ambulated on RA>>desaturation to 82%>>arrange 2L for home   COPD exacerbation - continue duonebs - continue IV Solu-Medrol>>d/c home with prednisone taper - continue Yupelri - continue brovana - continue pulmicort -viral resp panel--positive rhinovirus/enterovirus   Dyslipidemia - continue statin therapy.   GERD without esophagitis - continue PPI therapy.   Depression/Anxiety - continue Effexor XR. - added short term  alprazolam   Essential hypertension - continue losartan, amlodipine -added hydralazine   History of breast cancer - continue letrozole.   Class 3 obesity -BMI 43.93 -lifestyle modification               Consultants: none Procedures performed: none  Disposition: Home Diet recommendation:  Cardiac diet DISCHARGE MEDICATION: Allergies as of 05/09/2023       Reactions   Onion Nausea And Vomiting   Statins Other (See Comments)   Myalgias Muscle weakness   Stiolto Respimat [tiotropium Bromide-olodaterol] Other (See Comments)   Unknown reaction   Wound Dressing Adhesive Itching, Other (See Comments)   Redness Skin irritation Specifically Band-Aid brand bandages with cloth backing.   Zetia [ezetimibe] Other (See Comments)   Myalgias Stomach pain   Sulfa Antibiotics Rash        Medication List     TAKE these medications    acetaminophen 325 MG tablet Commonly known as: TYLENOL Take 2 tablets (650 mg total) by mouth every 4 (four) hours as needed for mild pain (pain score 1-3), fever or headache (or Fever >/= 101).   albuterol 108 (90 Base) MCG/ACT inhaler Commonly known as: VENTOLIN HFA Inhale 2 puffs into the lungs every 6 (six) hours as needed for wheezing or shortness of breath. What changed: Another medication with the same name was removed. Continue taking this medication, and follow the directions you see here.   ALPRAZolam 0.25 MG tablet Commonly known as: XANAX Take 1 tablet (0.25 mg total) by mouth 3 (three) times daily.   amLODipine 5 MG tablet Commonly known as: NORVASC Take 5 mg by mouth daily.  aspirin EC 81 MG tablet Take 1 tablet (81 mg total) by mouth daily with breakfast. Swallow whole.   BIOFREEZE EX Apply 1 application topically daily as needed (back pain).   BIOTIN PO Take 1 tablet by mouth daily.   clobetasol 0.05 % external solution Commonly known as: TEMOVATE Apply 1 Application topically daily as needed (Dermatitis).    cyanocobalamin 1000 MCG/ML injection Commonly known as: VITAMIN B12 INJECT 1 ML (1,000 MCG TOTAL) INTO THE MUSCLE EVERY 30 (THIRTY) DAYS.   fluticasone-salmeterol 100-50 MCG/ACT Aepb Commonly known as: ADVAIR Inhale 1 puff into the lungs 2 (two) times daily.   folic acid 1 MG tablet Commonly known as: FOLVITE Take 1 tablet (1 mg total) by mouth daily. Start taking on: May 10, 2023   furosemide 40 MG tablet Commonly known as: Lasix Take 1 tablet (40 mg total) by mouth in the morning. Take for up to 5 days at a time as needed for fluid buildup What changed:  when to take this additional instructions   hydrALAZINE 10 MG tablet Commonly known as: APRESOLINE Take 1 tablet (10 mg total) by mouth every 8 (eight) hours.   hydrocortisone 2.5 % lotion Apply 1 application  topically daily as needed (itching, skin irritation).   ipratropium-albuterol 0.5-2.5 (3) MG/3ML Soln Commonly known as: DUONEB Take 3 mLs by nebulization 3 (three) times daily.   letrozole 2.5 MG tablet Commonly known as: FEMARA TAKE 1 TABLET EVERY DAY What changed: when to take this   losartan 100 MG tablet Commonly known as: COZAAR TAKE 1 TABLET EVERY DAY   MELATONIN PO Take 1 tablet by mouth at bedtime.   pantoprazole 40 MG tablet Commonly known as: Protonix Take 1 tablet (40 mg total) by mouth daily. What changed:  when to take this additional instructions   predniSONE 10 MG tablet Commonly known as: DELTASONE Take 6 tablets (60 mg total) by mouth daily with breakfast. And decrease by one tablet daily Start taking on: May 10, 2023   propranolol 10 MG tablet Commonly known as: INDERAL Take 10-20 mg by mouth 3 (three) times daily as needed (Anxiety).   rosuvastatin 5 MG tablet Commonly known as: CRESTOR Take 1 tablet (5 mg total) by mouth daily. What changed: when to take this   Systane Complete 0.6 % Soln Generic drug: Propylene Glycol Place 1 drop into both eyes 2 (two) times daily as  needed (dry or irritated eyes).   venlafaxine XR 75 MG 24 hr capsule Commonly known as: EFFEXOR-XR TAKE 3 CAPSULES (225 MG TOTAL) BY MOUTH DAILY WITH BREAKFAST. What changed:  how much to take when to take this   VITAMIN D-3 PO Take 1 capsule by mouth daily.               Durable Medical Equipment  (From admission, onward)           Start     Ordered   05/09/23 1142  For home use only DME oxygen  Once       Comments: POC for COPD  Question Answer Comment  Length of Need 6 Months   Mode or (Route) Nasal cannula   Liters per Minute 2   Frequency Continuous (stationary and portable oxygen unit needed)   Oxygen conserving device Yes   Oxygen delivery system Gas      05/09/23 1141   05/03/23 1750  For home use only DME oxygen  Once       Comments: POC for COPD  Question  Answer Comment  Length of Need Lifetime   Mode or (Route) Nasal cannula   Liters per Minute 2   Frequency Continuous (stationary and portable oxygen unit needed)   Oxygen conserving device Yes   Oxygen delivery system Gas      05/03/23 1749            Discharge Exam: Filed Weights   05/03/23 0013  Weight: 112.5 kg   HEENT:  [place]/AT, No thrush, no icterus CV:  RRR, no rub, no S3, no S4 Lung:  bibasilar rales.  Fine bibasilar wheeze Abd:  soft/+BS, NT Ext:  No edema, no lymphangitis, no synovitis, no rash   Condition at discharge: stable  The results of significant diagnostics from this hospitalization (including imaging, microbiology, ancillary and laboratory) are listed below for reference.   Imaging Studies: CT CHEST WO CONTRAST Result Date: 05/08/2023 CLINICAL DATA:  Shortness of breath EXAM: CT CHEST WITHOUT CONTRAST TECHNIQUE: Multidetector CT imaging of the chest was performed following the standard protocol without IV contrast. RADIATION DOSE REDUCTION: This exam was performed according to the departmental dose-optimization program which includes automated exposure control,  adjustment of the mA and/or kV according to patient size and/or use of iterative reconstruction technique. COMPARISON:  04/23/2023 FINDINGS: Cardiovascular: Heart is normal size. Aorta is normal caliber. Coronary artery and aortic atherosclerosis. Mediastinum/Nodes: No mediastinal, hilar, or axillary adenopathy. Trachea and esophagus are unremarkable. Thyroid unremarkable. Lungs/Pleura: Mild airway thickening in the lower lobes, similar to prior study. Linear atelectasis or scarring in the lung bases. No confluent opacities or effusions. Upper Abdomen: No acute findings Musculoskeletal: No acute bony abnormality. Chest wall soft tissues are unremarkable. Postoperative changes in the left breast. IMPRESSION: No acute cardiopulmonary disease. Stable chronic peribronchial thickening in the lower lobes. Bibasilar scarring or atelectasis. Coronary artery disease. Aortic Atherosclerosis (ICD10-I70.0). Electronically Signed   By: Charlett Nose M.D.   On: 05/08/2023 00:01   DG Chest Portable 1 View Result Date: 05/03/2023 CLINICAL DATA:  Shortness of breath EXAM: PORTABLE CHEST 1 VIEW COMPARISON:  04/23/2023 FINDINGS: Heart and mediastinal contours are within normal limits. No focal opacities or effusions. No acute bony abnormality. IMPRESSION: No active disease. Electronically Signed   By: Charlett Nose M.D.   On: 05/03/2023 00:55   CT Angio Chest PE W/Cm &/Or Wo Cm Result Date: 04/23/2023 CLINICAL DATA:  Shortness of breath onset yesterday worsening today. Prior history of breast cancer 2022. Admitted for pneumonia last December. EXAM: CT ANGIOGRAPHY CHEST WITH CONTRAST TECHNIQUE: Multidetector CT imaging of the chest was performed using the standard protocol during bolus administration of intravenous contrast. Multiplanar CT image reconstructions and MIPs were obtained to evaluate the vascular anatomy. RADIATION DOSE REDUCTION: This exam was performed according to the departmental dose-optimization program which  includes automated exposure control, adjustment of the mA and/or kV according to patient size and/or use of iterative reconstruction technique. CONTRAST:  75mL OMNIPAQUE IOHEXOL 350 MG/ML SOLN COMPARISON:  PA Lat chest today, PA la old t chest 03/04/2023, CTA chest 01/12/2023, CTA chest 01/19/2021. FINDINGS: Cardiovascular: The cardiac size is normal. There is lipomatous hypertrophy the interatrial septum. There are patchy three-vessel coronary calcific plaques. No pericardial effusion or venous distention. The aorta is tortuous with scattered calcified plaques in the aorta and great vessels. There is no aneurysm, stenosis or dissection. The pulmonary arteries are normal caliber and do not show evidence of arterial embolism. Mediastinum/Nodes: No axillary or intrathoracic adenopathy or thyroid nodule. There are left axillary surgical clips. Unremarkable thoracic trachea,  thoracic esophagus. Lungs/Pleura: There is mild diffuse bronchial thickening. Increased linear atelectasis right lower lobe. Few linear scar-like opacities both lung bases. There is no consolidation, effusion or pneumothorax, or appreciable lung nodules. Upper Abdomen: No acute abnormality. Abdominal aortic atherosclerosis. Mild hepatic steatosis. Musculoskeletal: Multilevel thoracic spine bridging enthesopathy. Chronic left paracentral calcified disc extrusion is again noted T6-7 level displacing the thoracic cord posteriorly. No acute or other significant osseous findings. Postoperative changes again noted left breast, mild asymmetric thin skin thickening chronically seen as well as an overlying external breast prosthesis. Review of the MIP images confirms the above findings. IMPRESSION: 1. No evidence of arterial embolism or dilatation. 2. Aortic and coronary artery atherosclerosis. 3. Mild diffuse bronchial thickening. No focal pneumonia. Increased linear atelectasis right lower lobe. 4. Hepatic steatosis. 5. Chronic left paracentral calcified  disc extrusion T6-7 displacing the thoracic cord posteriorly. 6. Chronic postoperative changes left breast and axilla. No chest wall or axillary mass is seen. Electronically Signed   By: Almira Bar M.D.   On: 04/23/2023 22:03   DG Chest 2 View Result Date: 04/23/2023 CLINICAL DATA:  Short of breath, difficulty breathing EXAM: CHEST - 2 VIEW COMPARISON:  03/04/2023 FINDINGS: Frontal and lateral views of the chest demonstrate an unremarkable cardiac silhouette. No acute airspace disease, effusion, or pneumothorax. No acute bony abnormalities. IMPRESSION: 1. No acute intrathoracic process. Electronically Signed   By: Sharlet Salina M.D.   On: 04/23/2023 20:01    Microbiology: Results for orders placed or performed during the hospital encounter of 05/03/23  Resp panel by RT-PCR (RSV, Flu A&B, Covid) Anterior Nasal Swab     Status: None   Collection Time: 05/03/23  2:10 AM   Specimen: Anterior Nasal Swab  Result Value Ref Range Status   SARS Coronavirus 2 by RT PCR NEGATIVE NEGATIVE Final    Comment: (NOTE) SARS-CoV-2 target nucleic acids are NOT DETECTED.  The SARS-CoV-2 RNA is generally detectable in upper respiratory specimens during the acute phase of infection. The lowest concentration of SARS-CoV-2 viral copies this assay can detect is 138 copies/mL. A negative result does not preclude SARS-Cov-2 infection and should not be used as the sole basis for treatment or other patient management decisions. A negative result may occur with  improper specimen collection/handling, submission of specimen other than nasopharyngeal swab, presence of viral mutation(s) within the areas targeted by this assay, and inadequate number of viral copies(<138 copies/mL). A negative result must be combined with clinical observations, patient history, and epidemiological information. The expected result is Negative.  Fact Sheet for Patients:  BloggerCourse.com  Fact Sheet for  Healthcare Providers:  SeriousBroker.it  This test is no t yet approved or cleared by the [place] FDA and  has been authorized for detection and/or diagnosis of SARS-CoV-2 by FDA under an Emergency Use Authorization (EUA). This EUA will remain  in effect (meaning this test can be used) for the duration of the COVID-19 declaration under Section 564(b)(1) of the Act, 21 U.S.C.section 360bbb-3(b)(1), unless the authorization is terminated  or revoked sooner.       Influenza A by PCR NEGATIVE NEGATIVE Final   Influenza B by PCR NEGATIVE NEGATIVE Final    Comment: (NOTE) The Xpert Xpress SARS-CoV-2/FLU/RSV plus assay is intended as an aid in the diagnosis of influenza from Nasopharyngeal swab specimens and should not be used as a sole basis for treatment. Nasal washings and aspirates are unacceptable for Xpert Xpress SARS-CoV-2/FLU/RSV testing.  Fact Sheet for Patients: BloggerCourse.com  Fact Sheet  for Healthcare Providers: SeriousBroker.it  This test is not yet approved or cleared by the [place] and has been authorized for detection and/or diagnosis of SARS-CoV-2 by FDA under an Emergency Use Authorization (EUA). This EUA will remain in effect (meaning this test can be used) for the duration of the COVID-19 declaration under Section 564(b)(1) of the Act, 21 U.S.C. section 360bbb-3(b)(1), unless the authorization is terminated or revoked.     Resp Syncytial Virus by PCR NEGATIVE NEGATIVE Final    Comment: (NOTE) Fact Sheet for Patients: BloggerCourse.com  Fact Sheet for Healthcare Providers: SeriousBroker.it  This test is not yet approved or cleared by the [place] FDA and has been authorized for detection and/or diagnosis of SARS-CoV-2 by FDA under an Emergency Use Authorization (EUA). This EUA will remain in effect (meaning this  test can be used) for the duration of the COVID-19 declaration under Section 564(b)(1) of the Act, 21 U.S.C. section 360bbb-3(b)(1), unless the authorization is terminated or revoked.  Performed at [facility], [address] [place], [place] [address]   Respiratory (~20 pathogens) panel by PCR     Status: Abnormal   Collection Time: 05/07/23  3:20 PM   Specimen: Nasopharyngeal Swab; Respiratory  Result Value Ref Range Status   Adenovirus NOT DETECTED NOT DETECTED Final   Coronavirus 229E NOT DETECTED NOT DETECTED Final    Comment: (NOTE) The Coronavirus on the Respiratory Panel, DOES NOT test for the novel  Coronavirus (2019 nCoV)    Coronavirus HKU1 NOT DETECTED NOT DETECTED Final   Coronavirus NL63 NOT DETECTED NOT DETECTED Final   Coronavirus OC43 NOT DETECTED NOT DETECTED Final   Metapneumovirus NOT DETECTED NOT DETECTED Final   Rhinovirus / Enterovirus DETECTED (A) NOT DETECTED Final   Influenza A NOT DETECTED NOT DETECTED Final   Influenza B NOT DETECTED NOT DETECTED Final   Parainfluenza Virus 1 NOT DETECTED NOT DETECTED Final   Parainfluenza Virus 2 NOT DETECTED NOT DETECTED Final   Parainfluenza Virus 3 NOT DETECTED NOT DETECTED Final   Parainfluenza Virus 4 NOT DETECTED NOT DETECTED Final   Respiratory Syncytial Virus NOT DETECTED NOT DETECTED Final   Bordetella pertussis NOT DETECTED NOT DETECTED Final   Bordetella Parapertussis NOT DETECTED NOT DETECTED Final   Chlamydophila pneumoniae NOT DETECTED NOT DETECTED Final   Mycoplasma pneumoniae NOT DETECTED NOT DETECTED Final    Comment: Performed at [facility], [address] [place], [place] [address]    Labs: CBC: Recent Labs  Lab 05/03/23 0052 05/03/23 0417 05/08/23 0416  WBC 11.8* 12.1* 11.6*  NEUTROABS 8.7*  --   --   HGB 10.8* 11.4* 11.1*  HCT 35.0* 36.8 35.3*  MCV 90.0 89.8 88.5  PLT 231 231 220   Basic Metabolic Panel: Recent Labs  Lab 05/03/23 0052 05/03/23 0417 05/07/23 0413  05/08/23 0416 05/09/23 0428  NA 138 137 139 137 137  K 4.2 4.3 3.8 3.9 3.7  CL 100 100 103 101 101  CO2 28 25 26 26 27   GLUCOSE 108* 127* 152* 176* 132*  BUN 27* 27* 32* 32* 37*  CREATININE 1.02* 0.96 0.87 1.06* 0.98  CALCIUM 8.4* 8.5* 8.5* 8.1* 8.5*  MG  --   --  2.7*  --  2.5*  PHOS  --   --   --   --  3.4   Liver Function Tests: No results for input(s): "AST", "ALT", "ALKPHOS", "BILITOT", "PROT", "ALBUMIN" in the last 168 hours. CBG: No results for input(s): "GLUCAP"  in the last 168 hours.  Discharge time spent: greater than 30 minutes.  Signed: Catarina Hartshorn, MD [facility] 05/09/2023

## 2023-05-09 NOTE — Progress Notes (Signed)
 SATURATION QUALIFICATIONS: (This note is used to comply with regulatory documentation for home oxygen)  Patient Saturations on Room Air at Rest = 91%  Patient Saturations on Room Air while Ambulating = 82-91%   Please briefly explain why patient needs home oxygen: initially with ambulation patients SATs dropped into 80's encouraged deep breathing and slowing down SATs increased to 93-94% when we slowed her walking down. She was still working hard respirations 32 to breathe and HR fluctuated increased 120's down to high 90's. After rest patients sat remained stable at 92-94% on RA. HR stabilized at 94.

## 2023-05-09 NOTE — Progress Notes (Signed)
   05/09/23 1228  TOC Discharge Assessment  Final next level of care Home/Self Care  Barriers to Discharge No Barriers Identified  DME Arranged Oxygen  DME Agency [facility]  Date DME Agency Contacted 05/09/23  Time DME Agency Contacted 1144   Patient discharge on oxygen. [facility] notified provided portable tank for discharge. CM spoke with patient at bedside. CM explain to notify [facility] when she gets home for home oxygen set up.

## 2023-05-09 NOTE — Plan of Care (Signed)
   Problem: Clinical Measurements: Goal: Respiratory complications will improve Outcome: Progressing

## 2023-06-02 ENCOUNTER — Encounter (HOSPITAL_BASED_OUTPATIENT_CLINIC_OR_DEPARTMENT_OTHER): Payer: Self-pay

## 2023-06-09 ENCOUNTER — Ambulatory Visit
Admission: RE | Admit: 2023-06-09 | Discharge: 2023-06-09 | Disposition: A | Discharge status: Home / Self Care | Payer: Medicare PPO | Source: Ambulatory Visit | Attending: Oncology | Admitting: Oncology

## 2023-06-09 DIAGNOSIS — Z853 Personal history of malignant neoplasm of breast: Secondary | ICD-10-CM | POA: Insufficient documentation

## 2023-06-09 DIAGNOSIS — Z1231 Encounter for screening mammogram for malignant neoplasm of breast: Secondary | ICD-10-CM | POA: Insufficient documentation

## 2023-06-09 DIAGNOSIS — Z08 Encounter for follow-up examination after completed treatment for malignant neoplasm: Secondary | ICD-10-CM

## 2023-06-09 DIAGNOSIS — Z13820 Encounter for screening for osteoporosis: Secondary | ICD-10-CM | POA: Diagnosis not present

## 2023-06-09 DIAGNOSIS — Z780 Asymptomatic menopausal state: Secondary | ICD-10-CM | POA: Insufficient documentation

## 2023-08-20 ENCOUNTER — Ambulatory Visit: Attending: Internal Medicine

## 2023-08-20 DIAGNOSIS — G4710 Hypersomnia, unspecified: Secondary | ICD-10-CM | POA: Diagnosis not present

## 2023-08-20 DIAGNOSIS — R0683 Snoring: Secondary | ICD-10-CM | POA: Insufficient documentation

## 2023-08-20 DIAGNOSIS — J449 Chronic obstructive pulmonary disease, unspecified: Secondary | ICD-10-CM | POA: Diagnosis not present

## 2023-08-20 DIAGNOSIS — Z8673 Personal history of transient ischemic attack (TIA), and cerebral infarction without residual deficits: Secondary | ICD-10-CM | POA: Diagnosis not present

## 2023-08-20 DIAGNOSIS — E669 Obesity, unspecified: Secondary | ICD-10-CM | POA: Insufficient documentation

## 2023-08-20 DIAGNOSIS — Z6841 Body Mass Index (BMI) 40.0 and over, adult: Secondary | ICD-10-CM | POA: Diagnosis not present

## 2023-08-20 DIAGNOSIS — G4733 Obstructive sleep apnea (adult) (pediatric): Secondary | ICD-10-CM | POA: Diagnosis not present

## 2023-08-20 DIAGNOSIS — I10 Essential (primary) hypertension: Secondary | ICD-10-CM | POA: Insufficient documentation

## 2023-09-07 ENCOUNTER — Inpatient Hospital Stay: Payer: Medicare PPO | Attending: Oncology | Admitting: Oncology

## 2023-09-07 ENCOUNTER — Encounter: Payer: Self-pay | Admitting: Oncology

## 2023-09-07 VITALS — BP 135/70 | HR 94 | Temp 97.7°F | Resp 20 | Ht 63.0 in | Wt 258.0 lb

## 2023-09-07 DIAGNOSIS — M549 Dorsalgia, unspecified: Secondary | ICD-10-CM | POA: Diagnosis not present

## 2023-09-07 DIAGNOSIS — Z87891 Personal history of nicotine dependence: Secondary | ICD-10-CM | POA: Diagnosis not present

## 2023-09-07 DIAGNOSIS — Z79899 Other long term (current) drug therapy: Secondary | ICD-10-CM | POA: Diagnosis not present

## 2023-09-07 DIAGNOSIS — Z170 Estrogen receptor positive status [ER+]: Secondary | ICD-10-CM | POA: Diagnosis not present

## 2023-09-07 DIAGNOSIS — Z1721 Progesterone receptor positive status: Secondary | ICD-10-CM | POA: Diagnosis not present

## 2023-09-07 DIAGNOSIS — Z9221 Personal history of antineoplastic chemotherapy: Secondary | ICD-10-CM | POA: Insufficient documentation

## 2023-09-07 DIAGNOSIS — Z803 Family history of malignant neoplasm of breast: Secondary | ICD-10-CM | POA: Insufficient documentation

## 2023-09-07 DIAGNOSIS — Z800 Family history of malignant neoplasm of digestive organs: Secondary | ICD-10-CM | POA: Diagnosis not present

## 2023-09-07 DIAGNOSIS — C50412 Malignant neoplasm of upper-outer quadrant of left female breast: Secondary | ICD-10-CM | POA: Diagnosis present

## 2023-09-07 DIAGNOSIS — G8929 Other chronic pain: Secondary | ICD-10-CM | POA: Insufficient documentation

## 2023-09-07 DIAGNOSIS — Z923 Personal history of irradiation: Secondary | ICD-10-CM | POA: Insufficient documentation

## 2023-09-07 DIAGNOSIS — Z08 Encounter for follow-up examination after completed treatment for malignant neoplasm: Secondary | ICD-10-CM

## 2023-09-07 DIAGNOSIS — Z1732 Human epidermal growth factor receptor 2 negative status: Secondary | ICD-10-CM | POA: Insufficient documentation

## 2023-09-07 DIAGNOSIS — Z808 Family history of malignant neoplasm of other organs or systems: Secondary | ICD-10-CM | POA: Insufficient documentation

## 2023-09-07 DIAGNOSIS — C773 Secondary and unspecified malignant neoplasm of axilla and upper limb lymph nodes: Secondary | ICD-10-CM

## 2023-09-07 DIAGNOSIS — Z79811 Long term (current) use of aromatase inhibitors: Secondary | ICD-10-CM | POA: Diagnosis not present

## 2023-09-07 NOTE — Progress Notes (Signed)
 Patient states she has some concerns she would like to share with Dr. Melanee today. She is also having some right hip & left knee pain as well.

## 2023-09-07 NOTE — Progress Notes (Signed)
 Hematology/Oncology Consult note [facility]  [phone] [phone]  Patient Care Team: Myra Geni ORN, FNP as PCP - General (Family Medicine) Carolee Manus DASEN., MD as Consulting Physician (Ophthalmology) Dimmig, Debby, MD as Referring Physician (Orthopedic Surgery) Isenstein, Arin L, MD (Dermatology) Dasher, Alm LABOR, MD (Dermatology) Melanee Annah BROCKS, MD as Consulting Physician (Oncology) Lenn Aran, MD as Consulting Physician (Radiation Oncology) Dessa Reyes ORN, MD as Consulting Physician (General Surgery)   Name of the patient: Victoria Dean  [id number]  January 15, 1951   Date of visit: 09/07/23  Diagnosis- pathological prognostic stage Ib invasive lobular carcinoma of the left breast [id number] ER/PR positive HER2 negative     Chief complaint/ Reason for visit-routine follow-up of breast cancer on letrozole   Heme/Onc history: Patient is a 73 year old female with a past medical history significant for claustrophobia, hypertension hypercholesterolemia, depression among other medical problems.  Patient began to notice retraction of her left nipple.  This was followed by a diagnostic mammogram which showed vague shadowing in the upper outer quadrant of the left breast without a definitive mass.  Benign cysts in the lower left breast measuring 1.5 cm.  No evidence of malignancy in the right breast.  No evidence of abnormal left axillary lymph nodes.  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   Bilateral breast MRI showed 7 x 6 x 4 cm area of non-mass enhancement in the upper outer left breast at 1 o'clock position.  This involves anterior and middle third of the breast and extension into the posterior third.  2 inferior left axillary lymph nodes with mild  cortical thickening.   PET CT scan showed 1.7 cm hypermetabolic nodule in the midline of the neck which could represent thyroglossal cyst but malignancy cannot be excluded. Patient underwent left-sided lumpectomy with nipple excision.     Final pathology showed invasive lobular carcinoma grade 210.5 cm with associated DCIS.  Skin/nipple are present and involved.  Invasive carcinoma invades the nipple without skin ulceration.  Lymphovascular invasion present.  Margins negative.  3 out of 4 lymph nodes positive for malignancy 11 mm largest deposit with extranodal extension.  pT3 pN1 a cM0   Patient was taken back for axillary dissection given 3 lymph nodes positive with extranodal extension and 4 more lymph nodes out of 9 were positive with extranodal extension   Given cardiovascular comorbidities and patient age none anthracycline adjuvant chemotherapy options were discussed.  Patient did not wish to proceed with TC chemotherapy.  She completed 12 cycles of weekly Taxol  chemotherapy in October 2022 followed by adjuvant radiation therapy.  Patient started taking letrozole  sometime in February 2023.  Patient completed adjuvant Zometa    Interval history-patient has been having chronic back pain due to symptoms of spinal stenosis for which she follows up with [facility].  She will be getting steroid injections soon.  Tolerating letrozole  well without any significant side effects.  ECOG PS- 2 Pain scale- 4   Review of systems- Review of Systems  Constitutional:  Negative for chills, fever, malaise/fatigue and weight loss.  HENT:  Negative for congestion, ear discharge and nosebleeds.   Eyes:  Negative for blurred vision.  Respiratory:  Negative for cough, hemoptysis, sputum production, shortness of breath and wheezing.   Cardiovascular:  Negative for chest pain, palpitations, orthopnea and claudication.  Gastrointestinal:  Negative for abdominal pain, blood in stool, constipation, diarrhea,  heartburn, melena, nausea and vomiting.  Genitourinary:  Negative for dysuria, flank pain, frequency, hematuria and urgency.  Musculoskeletal:  Positive for back pain. Negative for joint pain and myalgias.  Skin:  Negative for rash.  Neurological:  Negative for dizziness, tingling, focal weakness, seizures, weakness and headaches.  Endo/Heme/Allergies:  Does not bruise/bleed easily.  Psychiatric/Behavioral:  Negative for depression and suicidal ideas. The patient does not have insomnia.       Allergies  Allergen Reactions   Onion Nausea And Vomiting   Statins Other (See Comments)    Myalgias Muscle weakness   Stiolto Respimat  [Tiotropium Bromide-Olodaterol] Other (See Comments)    Unknown reaction   Wound Dressing Adhesive Itching and Other (See Comments)    Redness Skin irritation Specifically Band-Aid brand bandages with cloth backing.   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    Vitamin D  deficiency      Past Surgical History:  Procedure Laterality Date   ABDOMINAL HYSTERECTOMY  1995   APPENDECTOMY  1994   BREAST BIOPSY Right 1998   neg   BREAST BIOPSY Left 05/30/2020   stere bx x clip path pending   BREAST LUMPECTOMY WITH SENTINEL LYMPH NODE BIOPSY Left 06/29/2020   Procedure: BREAST LUMPECTOMY WITH SENTINEL LYMPH NODE BX;  Surgeon: Dessa Reyes ORN, MD;  Location: [facility] OR[facility];  Service: General;   Laterality: Left;   CATARACT EXTRACTION W/PHACO Left 01/31/2022   Procedure: CATARACT EXTRACTION PHACO AND INTRAOCULAR LENS PLACEMENT (IOC);  Surgeon: Harrie Agent, MD;  Location: [facility];  Service: Ophthalmology;  Laterality: Left;  CDE 8.10   CATARACT EXTRACTION W/PHACO Right 02/21/2022   Procedure: CATARACT EXTRACTION PHACO AND INTRAOCULAR LENS PLACEMENT (IOC);  Surgeon: Harrie Agent, MD;  Location: [facility];  Service: Ophthalmology;  Laterality: Right;  CDE: 9.42   ESI     LUMBAR MICRODISCECTOMY  2019   L3-4   LUMBAR MICRODISCECTOMY  2008   L4-L5 repeated   MICRODISCECTOMY LUMBAR  2008   L4-5   MICRODISCECTOMY LUMBAR  2010   L4-L5 repeated   NODE DISSECTION Left 08/13/2020   Procedure: AXILLARY NODE DISSECTION;  Surgeon: Dessa Reyes ORN, MD;  Location: [facility] OR[facility];  Service: General;  Laterality: Left;   PORTACATH PLACEMENT Right 09/05/2020   Procedure: INSERTION PORT-A-CATH;  Surgeon: Dessa Reyes ORN, MD;  Location: [facility];  Service: General;  Laterality: Right;  right; monitor anesthesia care   SQUAMOUS CELL CARCINOMA EXCISION     TONSILLECTOMY  1971    Social History   Socioeconomic History   Marital status: Widowed    Spouse name: Not on file   Number of children: 1   Years of education: Not on file   Highest education level:  Bachelor's degree (e.g., BA, AB, BS)  Occupational History   Occupation: retired  Tobacco Use   Smoking status: Former    Current packs/day: 0.00    Average packs/day: 0.9 packs/day for 33.0 years (30.0 ttl pk-yrs)    Types: Cigarettes    Start date: 04/18/1985    Quit date: 04/19/2018    Years since quitting: 5.3   Smokeless tobacco: Never  Vaping Use   Vaping status: Never Used  Substance and Sexual Activity   Alcohol use: Yes    Comment: rarely   Drug use: No   Sexual activity: Not Currently  Other Topics Concern   Not on file  Social History Narrative   Not on file   Social Drivers of Health   Financial Resource Strain: Low Risk   (07/09/2023)   Received from [facility]   Overall Financial Resource Strain (CARDIA)    Difficulty of Paying Living Expenses: Not very hard  Food Insecurity: No Food Insecurity (07/09/2023)   Received from [facility]   Hunger Vital Sign    Within the past 12 months, you worried that your food would run out before you got the money to buy more.: Never true    Within the past 12 months, the food you bought just didn't last and you didn't have money to get more.: Never true  Transportation Needs: No Transportation Needs (07/09/2023)   Received from [facility] - Transportation    In the past 12 months, has lack of transportation kept you from medical appointments or from getting medications?: No    Lack of Transportation (Non-Medical): No  Physical Activity: Inactive (02/16/2018)   Exercise Vital Sign    Days of Exercise per Week: 0 days    Minutes of Exercise per Session: 0 min  Stress: No Stress Concern Present (02/16/2018)   [employer] of Occupational Health - Occupational Stress Questionnaire    Feeling of Stress : Only a little  Social Connections: Moderately Isolated (05/03/2023)   Social Connection and Isolation Panel    Frequency of Communication with Friends and Family: More than three times a week    Frequency of Social Gatherings with Friends and Family: More than three times a week    Attends Religious Services: More than 4 times per year    Active Member of [employer] or Organizations: No    Attends Banker Meetings: Never    Marital Status: Widowed  Intimate Partner Violence: Not At Risk (05/03/2023)   Humiliation, Afraid, Rape, and Kick questionnaire    Fear of Current or Ex-Partner: No    Emotionally Abused: No    Physically Abused: No    Sexually Abused: No    Family History  Problem Relation Age of Onset   Breast cancer Mother 47   Osteosarcoma Mother        d. 63   Hypertension Brother     COPD Father    Hypertension Father    Breast cancer Paternal Aunt        dx 45s   Breast cancer Paternal Grandmother        dx 77s   Stomach cancer Paternal Grandfather        dx 74s   Breast cancer Paternal Aunt        dx 85s   Brain cancer Paternal Aunt        dx 59s     Current Outpatient Medications:  acetaminophen  (TYLENOL ) 325 MG tablet, Take 2 tablets (650 mg total) by mouth every 4 (four) hours as needed for mild pain (pain score 1-3), fever or headache (or Fever >/= 101)., Disp: , Rfl:    acetylcysteine  (MUCOMYST ) 20 % nebulizer solution, , Disp: , Rfl:    albuterol  (VENTOLIN  HFA) 108 (90 Base) MCG/ACT inhaler, Inhale 2 puffs into the lungs every 6 (six) hours as needed for wheezing or shortness of breath., Disp: 8 g, Rfl: 1   amLODipine  (NORVASC ) 5 MG tablet, Take 5 mg by mouth daily., Disp: , Rfl:    aspirin  EC 81 MG tablet, Take 1 tablet (81 mg total) by mouth daily with breakfast. Swallow whole., Disp: , Rfl:    B-D 3CC LUER-LOK SYR 25GX1 25G X 1 3 ML MISC, , Disp: , Rfl:    BIOTIN  PO, Take 1 tablet by mouth daily., Disp: , Rfl:    Cholecalciferol  (VITAMIN D -3 PO), Take 1 capsule by mouth daily., Disp: , Rfl:    colchicine 0.6 MG tablet, Take 0.6 mg by mouth daily., Disp: , Rfl:    cyanocobalamin  (VITAMIN B12) 1000 MCG/ML injection, INJECT 1 ML (1,000 MCG TOTAL) INTO THE MUSCLE EVERY 30 (THIRTY) DAYS., Disp: 3 mL, Rfl: 3   cyclobenzaprine (FLEXERIL) 10 MG tablet, TAKE ONE TABLET BY MOUTH THREE TIMES DAILY AS NEEDED FOR 10 DAYS, Disp: , Rfl:    fluticasone -salmeterol (ADVAIR) 100-50 MCG/ACT AEPB, Inhale 1 puff into the lungs 2 (two) times daily., Disp: , Rfl:    folic acid  (FOLVITE ) 1 MG tablet, Take 1 tablet (1 mg total) by mouth daily., Disp: , Rfl:    furosemide  (LASIX ) 40 MG tablet, Take 1 tablet (40 mg total) by mouth in the morning. Take for up to 5 days at a time as needed for fluid buildup, Disp: 30 tablet, Rfl: 0   hydrALAZINE  (APRESOLINE ) 10 MG tablet, Take  1 tablet (10 mg total) by mouth every 8 (eight) hours., Disp: 90 tablet, Rfl: 1   hydrocortisone 2.5 % lotion, Apply 1 application  topically daily as needed (itching, skin irritation)., Disp: , Rfl:    ipratropium-albuterol  (DUONEB) 0.5-2.5 (3) MG/3ML SOLN, Take 3 mLs by nebulization 3 (three) times daily., Disp: 360 mL, Rfl: 1   letrozole  (FEMARA ) 2.5 MG tablet, TAKE 1 TABLET EVERY DAY, Disp: 90 tablet, Rfl: 3   losartan  (COZAAR ) 100 MG tablet, TAKE 1 TABLET EVERY DAY, Disp: 90 tablet, Rfl: 1   MELATONIN PO, Take 1 tablet by mouth at bedtime., Disp: , Rfl:    Menthol, Topical Analgesic, (BIOFREEZE EX), Apply 1 application topically daily as needed (back pain)., Disp: , Rfl:    mometasone  (ELOCON ) 0.1 % cream, APPLY A THIN LAYER TO THE AFFECTED AREA(S) BY TOPICAL ROUTE ONCE DAILY, Disp: , Rfl:    Needle, Disp, (HYPODERMIC NEEDLE 18GX1) 18G X 1 MISC, 25 each by Other route., Disp: , Rfl:    pantoprazole  (PROTONIX ) 40 MG tablet, Take 1 tablet (40 mg total) by mouth daily., Disp: 30 tablet, Rfl: 1   propranolol  (INDERAL ) 10 MG tablet, Take 10-20 mg by mouth 3 (three) times daily as needed (Anxiety)., Disp: , Rfl:    Propylene Glycol (SYSTANE COMPLETE) 0.6 % SOLN, Place 1 drop into both eyes 2 (two) times daily as needed (dry or irritated eyes)., Disp: , Rfl:    rosuvastatin  (CRESTOR ) 5 MG tablet, Take 1 tablet (5 mg total) by mouth daily., Disp: 90 tablet, Rfl: 3   Syringe, Disposable, (5-6CC SYRINGE) 6 ML MISC,  25 each by Other route., Disp: , Rfl:    tiZANidine (ZANAFLEX) 4 MG capsule, Take 4 mg by mouth., Disp: , Rfl:    venlafaxine  XR (EFFEXOR -XR) 75 MG 24 hr capsule, TAKE 3 CAPSULES (225 MG TOTAL) BY MOUTH DAILY WITH BREAKFAST., Disp: 270 capsule, Rfl: 1   ALPRAZolam  (XANAX ) 0.25 MG tablet, Take 1 tablet (0.25 mg total) by mouth 3 (three) times daily., Disp: 15 tablet, Rfl: 0   clobetasol (TEMOVATE) 0.05 % external solution, Apply 1 Application topically daily as needed (Dermatitis). (Patient  not taking: Reported on 09/07/2023), Disp: , Rfl:    predniSONE  (DELTASONE ) 10 MG tablet, Take 6 tablets (60 mg total) by mouth daily with breakfast. And decrease by one tablet daily, Disp: 21 tablet, Rfl: 0  Physical exam:  Vitals:   09/07/23 1012  BP: 135/70  Pulse: 94  Resp: 20  Temp: 97.7 F (36.5 C)  TempSrc: Tympanic  SpO2: 96%  Weight: 258 lb (117 kg)  Height: 5' 3 (1.6 m)   Physical Exam Constitutional:      Comments: Sitting in a wheelchair.  Appears in no acute distress  Cardiovascular:     Rate and Rhythm: Normal rate and regular rhythm.     Heart sounds: Normal heart sounds.  Pulmonary:     Effort: Pulmonary effort is normal.     Breath sounds: Normal breath sounds.  Skin:    General: Skin is warm and dry.  Neurological:     Mental Status: She is alert and oriented to person, place, and time.   Breast exam deferred today due to ongoing back pain  I have personally reviewed labs listed below:    Latest Ref Rng & Units 05/09/2023    4:28 AM  CMP  Glucose 70 - 99 mg/dL 867   BUN 8 - 23 mg/dL 37   Creatinine 9.55 - 1.00 mg/dL 9.01   Sodium 864 - 854 mmol/L 137   Potassium 3.5 - 5.1 mmol/L 3.7   Chloride 98 - 111 mmol/L 101   CO2 22 - 32 mmol/L 27   Calcium  8.9 - 10.3 mg/dL 8.5       Latest Ref Rng & Units 05/08/2023    4:16 AM  CBC  WBC 4.0 - 10.5 K/uL 11.6   Hemoglobin 12.0 - 15.0 g/dL 88.8   Hematocrit 63.9 - 46.0 % 35.3   Platelets 150 - 400 K/uL 220    I have personally reviewed Radiology images listed below: No images are attached to the encounter.  Sleep Study Documents Result Date: 08/27/2023 Ordered by an unspecified provider.    Assessment and plan- Patient is a 73 y.o. 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  She is s/p 12 cycles of weekly Taxol  chemotherapy and adjuvant radiation treatment.  She is here for a routine surveillance visit of breast  cancer  Clinically patient is doing well with no concerning signs and symptoms of recurrence.  Mammogram  from May 2025 did not show any concerning findings.  I have reviewed bone density results with the patient.  Bone density scan is also normal on letrozole .  She has completed 6 doses of adjuvant Zometa .  Patient started taking letrozole  in February  2023 and will at least take it for 5 years.  I will see her back in 6 months for breast exam.  No labs   Visit Diagnosis 1. Encounter for follow-up surveillance of breast  cancer   2. High risk medication use   3. Use of letrozole  (Femara )      Dr. Annah Skene, MD, MPH [facility] [phone] 09/07/2023 3:49 PM

## 2023-10-02 ENCOUNTER — Emergency Department (HOSPITAL_COMMUNITY)

## 2023-10-02 ENCOUNTER — Other Ambulatory Visit: Payer: Self-pay

## 2023-10-02 ENCOUNTER — Emergency Department (HOSPITAL_COMMUNITY)
Admission: EM | Admit: 2023-10-02 | Discharge: 2023-10-02 | Disposition: A | Discharge status: Home / Self Care | Attending: Emergency Medicine | Admitting: Emergency Medicine

## 2023-10-02 ENCOUNTER — Encounter (HOSPITAL_COMMUNITY): Payer: Self-pay

## 2023-10-02 DIAGNOSIS — R1013 Epigastric pain: Secondary | ICD-10-CM | POA: Insufficient documentation

## 2023-10-02 DIAGNOSIS — R079 Chest pain, unspecified: Secondary | ICD-10-CM | POA: Diagnosis present

## 2023-10-02 DIAGNOSIS — Z7982 Long term (current) use of aspirin: Secondary | ICD-10-CM | POA: Insufficient documentation

## 2023-10-02 DIAGNOSIS — R0789 Other chest pain: Secondary | ICD-10-CM | POA: Diagnosis not present

## 2023-10-02 LAB — BASIC METABOLIC PANEL WITH GFR
Anion gap: 13 (ref 5–15)
BUN: 18 mg/dL (ref 8–23)
CO2: 28 mmol/L (ref 22–32)
Calcium: 9.3 mg/dL (ref 8.9–10.3)
Chloride: 98 mmol/L (ref 98–111)
Creatinine, Ser: 0.78 mg/dL (ref 0.44–1.00)
GFR, Estimated: >60 mL/min (ref >60)
Glucose, Bld: 111 mg/dL — ABNORMAL HIGH (ref 70–99)
Potassium: 4.3 mmol/L (ref 3.5–5.1)
Sodium: 139 mmol/L (ref 135–145)

## 2023-10-02 LAB — CBC WITH DIFFERENTIAL/PLATELET
Abs Immature Granulocytes: 0.04 K/uL (ref 0.00–0.07)
Basophils Absolute: 0.1 K/uL (ref 0.0–0.1)
Basophils Relative: 0 %
Eosinophils Absolute: 0.1 K/uL (ref 0.0–0.5)
Eosinophils Relative: 1 %
HCT: 34.1 % — ABNORMAL LOW (ref 36.0–46.0)
Hemoglobin: 10.6 g/dL — ABNORMAL LOW (ref 12.0–15.0)
Immature Granulocytes: 0 %
Lymphocytes Relative: 9 %
Lymphs Abs: 1.1 K/uL (ref 0.7–4.0)
MCH: 27.7 pg (ref 26.0–34.0)
MCHC: 31.1 g/dL (ref 30.0–36.0)
MCV: 89 fL (ref 80.0–100.0)
Monocytes Absolute: 1.4 K/uL — ABNORMAL HIGH (ref 0.1–1.0)
Monocytes Relative: 11 %
Neutro Abs: 9.9 K/uL — ABNORMAL HIGH (ref 1.7–7.7)
Neutrophils Relative %: 79 %
Platelets: 236 K/uL (ref 150–400)
RBC: 3.83 MIL/uL — ABNORMAL LOW (ref 3.87–5.11)
RDW: 15.9 % — ABNORMAL HIGH (ref 11.5–15.5)
WBC: 12.6 K/uL — ABNORMAL HIGH (ref 4.0–10.5)
nRBC: 0 % (ref 0.0–0.2)

## 2023-10-02 LAB — HEPATIC FUNCTION PANEL
ALT: 26 U/L (ref 0–44)
AST: 20 U/L (ref 15–41)
Albumin: 3.2 g/dL — ABNORMAL LOW (ref 3.5–5.0)
Alkaline Phosphatase: 100 U/L (ref 38–126)
Bilirubin, Direct: <0.1 mg/dL (ref 0.0–0.2)
Total Bilirubin: 0.7 mg/dL (ref 0.0–1.2)
Total Protein: 6.4 g/dL — ABNORMAL LOW (ref 6.5–8.1)

## 2023-10-02 LAB — PROTIME-INR
INR: 0.9 (ref 0.8–1.2)
Prothrombin Time: 13 s (ref 11.4–15.2)

## 2023-10-02 LAB — LIPASE, BLOOD: Lipase: 27 U/L (ref 11–51)

## 2023-10-02 LAB — D-DIMER, QUANTITATIVE: D-Dimer, Quant: 0.93 ug{FEU}/mL — ABNORMAL HIGH (ref 0.00–0.50)

## 2023-10-02 LAB — TROPONIN I (HIGH SENSITIVITY)
Troponin I (High Sensitivity): 9 ng/L (ref <18)
Troponin I (High Sensitivity): 9 ng/L (ref <18)

## 2023-10-02 MED ORDER — MORPHINE SULFATE (PF) 4 MG/ML IV SOLN
4.0000 mg | Freq: Once | INTRAVENOUS | Status: AC
  Administered 2023-10-02: 4 mg via INTRAVENOUS
  Filled 2023-10-02: qty 1

## 2023-10-02 MED ORDER — ALUM & MAG HYDROXIDE-SIMETH 200-200-20 MG/5ML PO SUSP
30.0000 mL | Freq: Once | ORAL | Status: AC
  Administered 2023-10-02: 30 mL via ORAL
  Filled 2023-10-02: qty 30

## 2023-10-02 MED ORDER — KETOROLAC TROMETHAMINE 15 MG/ML IJ SOLN
15.0000 mg | Freq: Once | INTRAMUSCULAR | Status: AC
  Administered 2023-10-02: 15 mg via INTRAVENOUS
  Filled 2023-10-02: qty 1

## 2023-10-02 MED ORDER — HYDROMORPHONE HCL 1 MG/ML IJ SOLN
1.0000 mg | Freq: Once | INTRAMUSCULAR | Status: AC
  Administered 2023-10-02: 1 mg via INTRAVENOUS
  Filled 2023-10-02: qty 1

## 2023-10-02 MED ORDER — IOHEXOL 350 MG/ML SOLN
75.0000 mL | Freq: Once | INTRAVENOUS | Status: AC | PRN
  Administered 2023-10-02: 75 mL via INTRAVENOUS

## 2023-10-02 MED ORDER — LIDOCAINE VISCOUS HCL 2 % MT SOLN
15.0000 mL | Freq: Once | OROMUCOSAL | Status: AC
  Administered 2023-10-02: 15 mL via ORAL
  Filled 2023-10-02: qty 15

## 2023-10-02 NOTE — ED Provider Notes (Signed)
 [place] EMERGENCY DEPARTMENT AT [facility] Provider Note   CSN: [id number] Arrival date & time: 10/02/23  9077     Patient presents with: No chief complaint on file.   Victoria Dean is a 73 y.o. female.  She is here by EMS after worsening chest pain and shortness of breath since 3 AM.  Her symptoms started 4 days ago and she saw her doctor, was told it is probably muscular.  Tylenol  has been helping.  Symptoms acutely worsened overnight.  She uses oxygen  as needed and CPAP.  No history of coronary disease and no blood thinners.  No leg swelling or pain.   The history is provided by the patient and the EMS personnel.  Chest Pain Pain location:  Substernal area Pain quality: aching   Pain severity:  Severe Onset quality:  Sudden Duration:  6 hours Timing:  Constant Progression:  Unchanged Chronicity:  New Relieved by:  None tried Worsened by:  Deep breathing and movement Ineffective treatments:  Oxygen  Associated symptoms: shortness of breath   Associated symptoms: no abdominal pain, no cough, no diaphoresis, no fever, no nausea and no vomiting   Risk factors: no coronary artery disease        Prior to Admission medications   Medication Sig Start Date End Date Taking? Authorizing Provider  acetaminophen  (TYLENOL ) 325 MG tablet Take 2 tablets (650 mg total) by mouth every 4 (four) hours as needed for mild pain (pain score 1-3), fever or headache (or Fever >/= 101). 04/25/23   Pearlean, Courage, MD  acetylcysteine  (MUCOMYST ) 20 % nebulizer solution  07/13/23   [provider]  albuterol  (VENTOLIN  HFA) 108 (90 Base) MCG/ACT inhaler Inhale 2 puffs into the lungs every 6 (six) hours as needed for wheezing or shortness of breath. 04/25/23   Pearlean, Courage, MD  ALPRAZolam  (XANAX ) 0.25 MG tablet Take 1 tablet (0.25 mg total) by mouth 3 (three) times daily. 05/09/23   Evonnie Lenis, MD  amLODipine  (NORVASC ) 5 MG tablet Take 5 mg by mouth daily. 10/09/22   [provider]  aspirin  EC 81 MG tablet Take 1 tablet (81 mg total) by mouth daily with breakfast. Swallow whole. 04/25/23 04/24/24  Pearlean Manus, MD  B-D 3CC LUER-LOK SYR 25GX1 25G X 1 3 ML MISC  08/02/23   [provider]  BIOTIN  PO Take 1 tablet by mouth daily.    [provider]  Cholecalciferol  (VITAMIN D -3 PO) Take 1 capsule by mouth daily.    [provider]  clobetasol (TEMOVATE) 0.05 % external solution Apply 1 Application topically daily as needed (Dermatitis). Patient not taking: Reported on 09/07/2023 12/11/22   [provider]  colchicine 0.6 MG tablet Take 0.6 mg by mouth daily. 08/06/23   [provider]  cyanocobalamin  (VITAMIN B12) 1000 MCG/ML injection INJECT 1 ML (1,000 MCG TOTAL) INTO THE MUSCLE EVERY 30 (THIRTY) DAYS. 01/05/23   Rao, Archana C, MD  cyclobenzaprine (FLEXERIL) 10 MG tablet TAKE ONE TABLET BY MOUTH THREE TIMES DAILY AS NEEDED FOR 10 DAYS 05/20/23   [provider]  fluticasone -salmeterol (ADVAIR) 100-50 MCG/ACT AEPB Inhale 1 puff into the lungs 2 (two) times daily. 03/17/23   [provider]  folic acid  (FOLVITE ) 1 MG tablet Take 1 tablet (1 mg total) by mouth daily. 05/10/23   Evonnie Lenis, MD  furosemide  (LASIX ) 40 MG tablet Take 1 tablet (40 mg total) by mouth in the morning. Take for up to 5 days at a time as  needed for fluid buildup 04/25/23 04/24/24  Pearlean Manus, MD  hydrALAZINE  (APRESOLINE ) 10 MG tablet Take 1 tablet (10 mg total) by mouth every 8 (eight) hours. 05/09/23   Evonnie Lenis, MD  hydrocortisone 2.5 % lotion Apply 1 application  topically daily as needed (itching, skin irritation).    [provider]  ipratropium-albuterol  (DUONEB) 0.5-2.5 (3) MG/3ML SOLN Take 3 mLs by nebulization 3 (three) times daily. 05/09/23   Evonnie Lenis, MD  letrozole  (FEMARA ) 2.5 MG tablet TAKE 1 TABLET EVERY DAY 01/05/23   Rao, Archana C, MD  losartan  (COZAAR ) 100 MG tablet TAKE 1 TABLET EVERY DAY 05/10/21   Bertrum Charlie CROME, MD  MELATONIN PO Take 1 tablet by mouth at bedtime.    [provider]  Menthol, Topical Analgesic, (BIOFREEZE EX) Apply 1 application topically daily as needed (back pain).    [provider]  mometasone  (ELOCON ) 0.1 % cream APPLY A THIN LAYER TO THE AFFECTED AREA(S) BY TOPICAL ROUTE ONCE DAILY 08/24/23   [provider]  Needle, Disp, (HYPODERMIC NEEDLE 18GX1) 18G X 1 MISC 25 each by Other route. 07/23/23   [provider]  pantoprazole  (PROTONIX ) 40 MG tablet Take 1 tablet (40 mg total) by mouth daily. 01/21/23 01/21/24  Ricky Fines, MD  predniSONE  (DELTASONE ) 10 MG tablet Take 6 tablets (60 mg total) by mouth daily with breakfast. And decrease by one tablet daily 05/10/23   Tat, Lenis, MD  propranolol  (INDERAL ) 10 MG tablet Take 10-20 mg by mouth 3 (three) times daily as needed (Anxiety). 02/05/23   [provider]  Propylene Glycol (SYSTANE COMPLETE) 0.6 % SOLN Place 1 drop into both eyes 2 (two) times daily as needed (dry or irritated eyes).    [provider]  rosuvastatin  (CRESTOR ) 5 MG tablet Take 1 tablet (5 mg total) by mouth daily. 05/22/21   Bertrum Charlie CROME, MD  Syringe, Disposable, (5-6CC SYRINGE) 6 ML MISC 25 each by Other route. 07/23/23   [provider]  tiZANidine (ZANAFLEX) 4 MG capsule Take 4 mg by mouth.    [provider]  venlafaxine  XR (EFFEXOR -XR) 75 MG 24 hr capsule TAKE 3 CAPSULES (225 MG TOTAL) BY MOUTH DAILY WITH BREAKFAST. 01/10/21   Bertrum Charlie CROME, MD    Allergies: Onion, Statins, Stiolto respimat  [tiotropium bromide-olodaterol], Wound dressing adhesive, Zetia  [ezetimibe ], and Sulfa antibiotics    Review of Systems  Constitutional:  Negative for diaphoresis and fever.  HENT:  Negative for sore throat.   Respiratory:  Positive for shortness of breath. Negative for cough.   Cardiovascular:  Positive for chest pain.  Gastrointestinal:  Negative for abdominal pain, nausea and  vomiting.  Genitourinary:  Negative for dysuria.  Skin:  Negative for rash.    Updated Vital Signs BP (!) 121/55   Pulse 88   Temp 98.8 F (37.1 C) (Oral)   Resp (!) 21   Ht 5' 3 (1.6 m)   Wt 117 kg   SpO2 98%   BMI 45.69 kg/m   Physical Exam Vitals and nursing note reviewed.  Constitutional:      General: She is in acute distress.     Appearance: Normal appearance. She is well-developed.  HENT:     Head: Normocephalic and atraumatic.  Eyes:     Conjunctiva/sclera: Conjunctivae normal.  Cardiovascular:     Rate and Rhythm: Normal rate and regular rhythm.     Heart sounds: No murmur heard. Pulmonary:     Effort: Pulmonary effort is normal.  No respiratory distress.     Breath sounds: Normal breath sounds. No stridor. No wheezing.  Abdominal:     Palpations: Abdomen is soft.     Tenderness: There is no abdominal tenderness. There is no guarding or rebound.  Musculoskeletal:        General: No tenderness.     Cervical back: Neck supple.     Right lower leg: No edema.     Left lower leg: No edema.  Skin:    General: Skin is warm and dry.  Neurological:     General: No focal deficit present.     Mental Status: She is alert.     GCS: GCS eye subscore is 4. GCS verbal subscore is 5. GCS motor subscore is 6.     (all labs ordered are listed, but only abnormal results are displayed) Labs Reviewed  BASIC METABOLIC PANEL WITH GFR - Abnormal; Notable for the following components:      Result Value   Glucose, Bld 111 (*)    All other components within normal limits  CBC WITH DIFFERENTIAL/PLATELET - Abnormal; Notable for the following components:   WBC 12.6 (*)    RBC 3.83 (*)    Hemoglobin 10.6 (*)    HCT 34.1 (*)    RDW 15.9 (*)    Neutro Abs 9.9 (*)    Monocytes Absolute 1.4 (*)    All other components within normal limits  D-DIMER, QUANTITATIVE - Abnormal; Notable for the following components:   D-Dimer, Quant 0.93 (*)    All other components within normal  limits  PROTIME-INR  LIPASE, BLOOD  HEPATIC FUNCTION PANEL  TROPONIN I (HIGH SENSITIVITY)  TROPONIN I (HIGH SENSITIVITY)    EKG: EKG Interpretation Date/Time:  Friday October 02 2023 09:32:36 EDT Ventricular Rate:  95 PR Interval:  170 QRS Duration:  76 QT Interval:  323 QTC Calculation: 398 R Axis:   78  Text Interpretation: Sinus rhythm Atrial premature complexes No significant change since 3/25 Confirmed by Towana Sharper [phone]) on 10/02/2023 9:47:55 AM  Radiology: US  Abdomen Limited Result Date: 10/02/2023 CLINICAL DATA:  Right upper quadrant pain EXAM: ULTRASOUND ABDOMEN LIMITED RIGHT UPPER QUADRANT COMPARISON:  None Available. FINDINGS: Gallbladder: No gallstones or wall thickening visualized. No sonographic Murphy sign noted by sonographer. Common bile duct: Diameter: 6.1 mm Liver: No focal lesion identified. Increase in parenchymal echogenicity. Portal vein is patent on color Doppler imaging with normal direction of blood flow towards the liver. Other: None. IMPRESSION: 1. No cholelithiasis or sonographic evidence of acute cholecystitis. 2. Increased echogenicity of the liver parenchyma is a nonspecific indicator of hepatocellular dysfunction, most commonly steatosis. Electronically Signed   By: Greig Pique M.D.   On: 10/02/2023 17:18   CT Angio Chest PE W/Cm &/Or Wo Cm Result Date: 10/02/2023 EXAM: CTA CHEST 10/02/2023 12:56:42 PM TECHNIQUE: CTA of the chest was performed after the administration of intravenous contrast. Multiplanar reformatted images are provided for review. MIP images are provided for review. Automated exposure control, iterative reconstruction, and/or weight based adjustment of the mA/kV was utilized to reduce the radiation dose to as low as reasonably achievable. COMPARISON: 09/06/2023 CLINICAL HISTORY: Pulmonary embolism (PE) suspected, low to intermediate prob, positive D-dimer. Pt arrived with [facility]. Reports chest pain and shortness of breath. EMS advised  that vitals are stable and 12-lead is unremarkable. 2.5mg  of albuterol  administered and 15lpm NRB applied due to significantly labored breathing. Initial o2 saturation 98% ; while on chronic 2lpm. Pt appears pink, warm, and  dry. Breathing noted to be labored, but pt advised it is due to the chest pain. Chest pain started Monday and had been getting progressively worse. Pt reported that her PCP recommended ED visit. Pt is ; Aox4. FINDINGS: PULMONARY ARTERIES: Pulmonary arteries are adequately opacified for evaluation. No acute pulmonary embolus. Main pulmonary artery is normal in caliber. MEDIASTINUM: The heart and pericardium demonstrate no acute abnormality. There is no acute abnormality of the thoracic aorta. scattered calcified plaque in the aortic arch and descending thoracic aorta. Mitral annulus calcifications. LYMPH NODES: Subcentimeter AP window and right paratracheal lymph nodes. LUNGS AND PLEURA: The lungs are without acute process. No focal consolidation or pulmonary edema. No evidence of pleural effusion or pneumothorax. Minimal linear scarring or atelectasis in both lung bases . UPPER ABDOMEN: Limited images of the upper abdomen are unremarkable. SOFT TISSUES AND BONES: No acute bone or soft tissue abnormality. vertebral endplate spurring at multiple levels in the lower thoracic spine. Surgical clips left breast. IMPRESSION: 1. No pulmonary embolism or acute pulmonary abnormality. Electronically signed by: Katheleen Faes MD 10/02/2023 01:17 PM EDT RP Workstation: [id number]   DG Chest Port 1 View Result Date: 10/02/2023 CLINICAL DATA:  Chest pain and respiratory distress. EXAM: PORTABLE CHEST 1 VIEW COMPARISON:  05/03/2023 FINDINGS: The heart size and mediastinal contours are within normal limits. Stable mild chronic lung disease potentially representing mild emphysema. There is no evidence of pulmonary edema, consolidation, pneumothorax or pleural fluid. The visualized skeletal structures are  unremarkable. IMPRESSION: No acute findings. Stable mild chronic lung disease potentially representing mild emphysema. Electronically Signed   By: Marcey Moan M.D.   On: 10/02/2023 10:01     Procedures   Medications Ordered in the ED  morphine  (PF) 4 MG/ML injection 4 mg (4 mg Intravenous Given 10/02/23 1040)  iohexol  (OMNIPAQUE ) 350 MG/ML injection 75 mL (75 mLs Intravenous Contrast Given 10/02/23 1242)  HYDROmorphone  (DILAUDID ) injection 1 mg (1 mg Intravenous Given 10/02/23 1258)  alum & mag hydroxide-simeth (MAALOX/MYLANTA) 200-200-20 MG/5ML suspension 30 mL (30 mLs Oral Given 10/02/23 1554)    And  lidocaine  (XYLOCAINE ) 2 % viscous mouth solution 15 mL (15 mLs Oral Given 10/02/23 1553)  ketorolac  (TORADOL ) 15 MG/ML injection 15 mg (15 mg Intravenous Given 10/02/23 1554)    Clinical Course as of 10/02/23 1721  Fri Oct 02, 2023  1006 Chest x-ray does not show any acute findings.  Awaiting radiology reading. [MB]  1320 Patient's troponins are flat and her CT angio does not show any acute findings.  She still having significant amount of pain though.  Will try GI cocktail. [MB]    Clinical Course User Index [MB] Towana Ozell BROCKS, MD                                 Medical Decision Making Amount and/or Complexity of Data Reviewed Labs: ordered. Radiology: ordered.  Risk OTC drugs. Prescription drug management.   This patient complains of chest pain and shortness of breath; this involves an extensive number of treatment Options and is a complaint that carries with it a high risk of complications and morbidity. The differential includes ACS, pneumonia, PE, pneumothorax, intra-abdominal, vascular, reflux, musculoskeletal  I ordered, reviewed and interpreted labs, which included CBC of mildly elevated white count, slightly low hemoglobin, chemistries normal, troponins flat, D-dimer mildly elevated for age I ordered medication IV pain medicine, GI cocktail and reviewed PMP when  indicated. I ordered  imaging studies which included chest x-ray and CT angio chest and I independently    visualized and interpreted imaging which showed no acute findings Previous records obtained and reviewed in epic no recent presentations Cardiac monitoring reviewed, normal sinus rhythm Social determinants considered, no significant barriers Critical Interventions: None  After the interventions stated above, I reevaluated the patient and found patient to be moderately improved although still not pain-free.  Workup is not clearly identified cause of her symptoms. Admission and further testing considered, her care is signed out to Dr. Charlyn pending GI cocktail Toradol .  If she is improving feel she could likely be discharged and follow-up outpatient with cardiology.      Final diagnoses:  Atypical chest pain    ED Discharge Orders     None          Towana Ozell BROCKS, MD 10/02/23 1724

## 2023-10-02 NOTE — ED Triage Notes (Signed)
 Pt arrived with [facility]. Reports chest pain and shortness of breath. EMS advised that vitals are stable and 12-lead is unremarkable. 2.5mg  of albuterol  administered and 15lpm NRB applied due to significantly labored breathing. Initial o2 saturation 98% while on chronic 2lpm. Pt appears pink, warm, and dry. Breathing noted to be labored, but pt advised it is due to the chest pain. Chest pain started Monday and had been getting progressively worse. Pt reported that her PCP recommended ED visit. Pt is Aox4.

## 2023-10-02 NOTE — ED Provider Notes (Signed)
  Physical Exam  BP (!) 119/51   Pulse 83   Temp 98.2 F (36.8 C) (Oral)   Resp (!) 27   Ht 5' 3 (1.6 m)   Wt 117 kg   SpO2 97%   BMI 45.69 kg/m   Physical Exam  Procedures  Procedures  ED Course / MDM   Clinical Course as of 10/02/23 1815  Fri Oct 02, 2023  1006 Chest x-ray does not show any acute findings.  Awaiting radiology reading. [MB]  1320 Patient's troponins are flat and her CT angio does not show any acute findings.  She still having significant amount of pain though.  Will try GI cocktail. [MB]    Clinical Course User Index [MB] Towana Ozell BROCKS, MD   Medical Decision Making Amount and/or Complexity of Data Reviewed Labs: ordered. Radiology: ordered.  Risk OTC drugs. Prescription drug management.   I was signed out care of this patient.  Patient had come in with chief complaint of chest pain.  Patient's pain is worse with inspiration.  Pain has been present since Monday.  Delta troponin is normal.  EKG is normal. D-dimer was elevated, CT PE is negative.  Dr. Towana has seen the patient.  He is giving her GI cocktail.  He is requesting that I reassessed the patient thereafter.  I assessed the patient.  GI cocktail did not help her.  She did have epigastric pain, but also right upper quadrant tenderness on exam.  Ultrasound was ordered and it is negative for acute finding.  Lipase is normal, LFTs normal.The patient appears reasonably screened and/or stabilized for discharge and I doubt any other medical condition or other [facility] requiring further screening, evaluation, or treatment in the ED at this time prior to discharge.   Results from the ER workup discussed with the patient face to face and all questions answered to the best of my ability. The patient is safe for discharge with strict return precautions.       Victoria Sora, MD 10/02/23 [phone]

## 2023-10-02 NOTE — Discharge Instructions (Addendum)
 We saw you in the ER for the chest pain/shortness of breath. All of our cardiac workup is normal, including labs, EKG and chest X-RAY are normal. We are not sure what is causing your discomfort, but we feel comfortable sending you home at this time. The workup in the ER is not complete, and you should follow up with your primary care doctor for further evaluation.  Please return to the ER if you have worsening chest pain, shortness of breath, pain radiating to your jaw, shoulder, or back, sweats or fainting. Otherwise see the Cardiologist or your primary care doctor as requested.

## 2023-10-09 ENCOUNTER — Other Ambulatory Visit: Payer: Self-pay | Admitting: Oncology

## 2023-11-03 ENCOUNTER — Other Ambulatory Visit: Payer: Self-pay | Admitting: Cardiovascular Disease

## 2023-11-03 DIAGNOSIS — R0789 Other chest pain: Secondary | ICD-10-CM

## 2023-11-03 DIAGNOSIS — I10 Essential (primary) hypertension: Secondary | ICD-10-CM

## 2023-11-03 DIAGNOSIS — Z7982 Long term (current) use of aspirin: Secondary | ICD-10-CM

## 2023-11-03 DIAGNOSIS — E785 Hyperlipidemia, unspecified: Secondary | ICD-10-CM

## 2023-11-03 DIAGNOSIS — E66813 Obesity, class 3: Secondary | ICD-10-CM

## 2023-11-03 DIAGNOSIS — Z87891 Personal history of nicotine dependence: Secondary | ICD-10-CM

## 2023-11-03 DIAGNOSIS — I700 Atherosclerosis of aorta: Secondary | ICD-10-CM

## 2023-11-11 ENCOUNTER — Telehealth (HOSPITAL_COMMUNITY): Payer: Self-pay | Admitting: Emergency Medicine

## 2023-11-11 MED ORDER — METOPROLOL TARTRATE 100 MG PO TABS: 100.0000 mg | ORAL_TABLET | Freq: Once | ORAL | 0 refills | Status: AC

## 2023-11-11 NOTE — Telephone Encounter (Signed)
Reaching out to patient to offer assistance regarding upcoming cardiac imaging study; pt verbalizes understanding of appt date/time, parking situation and where to check in, pre-test NPO status and medications ordered, and verified current allergies; name and call back number provided for further questions should they arise Victoria Bond RN Navigator Cardiac Imaging [facility] [phone] office [phone] cell   '100mg'$  metoprolol tartrate

## 2023-11-12 ENCOUNTER — Ambulatory Visit
Admission: RE | Admit: 2023-11-12 | Discharge: 2023-11-12 | Disposition: A | Discharge status: Home / Self Care | Source: Ambulatory Visit | Attending: Cardiovascular Disease | Admitting: Cardiovascular Disease

## 2023-11-12 DIAGNOSIS — I10 Essential (primary) hypertension: Secondary | ICD-10-CM | POA: Diagnosis present

## 2023-11-12 DIAGNOSIS — I700 Atherosclerosis of aorta: Secondary | ICD-10-CM | POA: Insufficient documentation

## 2023-11-12 DIAGNOSIS — E785 Hyperlipidemia, unspecified: Secondary | ICD-10-CM | POA: Diagnosis present

## 2023-11-12 DIAGNOSIS — Z87891 Personal history of nicotine dependence: Secondary | ICD-10-CM | POA: Insufficient documentation

## 2023-11-12 DIAGNOSIS — Z7982 Long term (current) use of aspirin: Secondary | ICD-10-CM | POA: Insufficient documentation

## 2023-11-12 DIAGNOSIS — R0789 Other chest pain: Secondary | ICD-10-CM | POA: Diagnosis present

## 2023-11-12 DIAGNOSIS — E66813 Obesity, class 3: Secondary | ICD-10-CM | POA: Diagnosis present

## 2023-11-12 MED ORDER — METOPROLOL TARTRATE 5 MG/5ML IV SOLN
10.0000 mg | Freq: Once | INTRAVENOUS | Status: DC | PRN
  Filled 2023-11-12: qty 10

## 2023-11-12 MED ORDER — DILTIAZEM HCL 25 MG/5ML IV SOLN
10.0000 mg | INTRAVENOUS | Status: DC | PRN
  Filled 2023-11-12: qty 5

## 2023-11-12 MED ORDER — NITROGLYCERIN 0.4 MG SL SUBL
0.8000 mg | SUBLINGUAL_TABLET | Freq: Once | SUBLINGUAL | Status: AC
Start: 2023-11-12 — End: 2023-11-12
  Administered 2023-11-12: 0.8 mg via SUBLINGUAL
  Filled 2023-11-12: qty 25

## 2023-11-12 MED ORDER — IOHEXOL 350 MG/ML SOLN
100.0000 mL | Freq: Once | INTRAVENOUS | Status: AC | PRN
  Administered 2023-11-12: 100 mL via INTRAVENOUS

## 2023-11-12 NOTE — Progress Notes (Signed)
 Patient tolerated CT well. Drank water after. Vital signs stable encourage to drink water throughout day.Reasons explained and verbalized understanding. Ambulated steady gait.

## 2024-01-06 ENCOUNTER — Encounter (HOSPITAL_COMMUNITY): Payer: Self-pay

## 2024-01-08 ENCOUNTER — Encounter (HOSPITAL_COMMUNITY)
Admission: RE | Admit: 2024-01-08 | Discharge: 2024-01-08 | Disposition: A | Discharge status: Home / Self Care | Source: Ambulatory Visit | Attending: Pulmonary Disease | Admitting: Pulmonary Disease

## 2024-01-08 NOTE — Progress Notes (Signed)
 Called patient for virtual orientation visit for pulmonary rehab. She was referred by Dr. Faud Aleskerov from [facility] for COPD. She is declining the program at this time due to chronic back pain. She says she may change her mind after talking with Dr. Aleskerov and if she is able to improve her pain management. She said she would call back if she decided to participate in the program.

## 2024-01-18 ENCOUNTER — Encounter (HOSPITAL_COMMUNITY)

## 2024-02-10 ENCOUNTER — Ambulatory Visit: Admitting: Cardiology

## 2024-03-09 ENCOUNTER — Encounter: Payer: Self-pay | Admitting: Oncology

## 2024-03-09 ENCOUNTER — Other Ambulatory Visit: Payer: Self-pay | Admitting: Oncology

## 2024-03-09 ENCOUNTER — Inpatient Hospital Stay: Admitting: Oncology

## 2024-03-09 VITALS — BP 125/76 | HR 94 | Temp 98.2°F | Resp 19 | Ht 63.0 in | Wt 228.0 lb

## 2024-03-09 DIAGNOSIS — Z79811 Long term (current) use of aromatase inhibitors: Secondary | ICD-10-CM | POA: Diagnosis not present

## 2024-03-09 DIAGNOSIS — Z853 Personal history of malignant neoplasm of breast: Secondary | ICD-10-CM

## 2024-03-09 DIAGNOSIS — Z08 Encounter for follow-up examination after completed treatment for malignant neoplasm: Secondary | ICD-10-CM | POA: Diagnosis not present

## 2024-03-09 DIAGNOSIS — Z79899 Other long term (current) drug therapy: Secondary | ICD-10-CM

## 2024-03-09 NOTE — Progress Notes (Signed)
 Patient doing good; no new or acute concerns at this time.

## 2024-03-09 NOTE — Progress Notes (Signed)
 "    Hematology/Oncology Consult note [facility]  [phone] [phone]  Patient Care Team: Myra Geni ORN, FNP as PCP - General (Family Medicine) Carolee Manus DASEN., MD as Consulting Physician (Ophthalmology) Dimmig, Debby, MD as Referring Physician (Orthopedic Surgery) Isenstein, Arin L, MD (Dermatology) Dasher, Alm LABOR, MD (Dermatology) Melanee Annah BROCKS, MD as Consulting Physician (Oncology) Lenn Aran, MD as Consulting Physician (Radiation Oncology) Dessa Reyes ORN, MD as Consulting Physician (General Surgery)   Name of the patient: Victoria Dean  [id number]  04-[name]-1951   Date of visit: 03/09/24  Diagnosis- pathological prognostic stage Ib invasive lobular carcinoma of the left breast [id number] ER/PR positive HER2 negative   Chief complaint/ Reason for visit-routine follow-up of breast cancer  Heme/Onc history: Patient is a 74 year old female with a past medical history significant for claustrophobia, hypertension hypercholesterolemia, depression among other medical problems.  Patient began to notice retraction of her left nipple.  This was followed by a diagnostic mammogram which showed vague shadowing in the upper outer quadrant of the left breast without a definitive mass.  Benign cysts in the lower left breast measuring 1.5 cm.  No evidence of malignancy in the right breast.  No evidence of abnormal left axillary lymph nodes.  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   Bilateral breast MRI showed 7 x 6 x 4 cm area of non-mass enhancement in the upper outer left breast at 1 o'clock position.  This involves anterior and middle third of the breast and extension into the posterior third.  2 inferior left axillary lymph nodes with mild cortical thickening.    PET CT scan showed 1.7 cm hypermetabolic nodule in the midline of the neck which could represent thyroglossal cyst but malignancy cannot be excluded. Patient underwent left-sided lumpectomy with nipple excision.     Final pathology showed invasive lobular carcinoma grade 210.5 cm with associated DCIS.  Skin/nipple are present and involved.  Invasive carcinoma invades the nipple without skin ulceration.  Lymphovascular invasion present.  Margins negative.  3 out of 4 lymph nodes positive for malignancy 11 mm largest deposit with extranodal extension.  pT3 pN1 a cM0   Patient was taken back for axillary dissection given 3 lymph nodes positive with extranodal extension and 4 more lymph nodes out of 9 were positive with extranodal extension   Given cardiovascular comorbidities and patient age none anthracycline adjuvant chemotherapy options were discussed.  Patient did not wish to proceed with TC chemotherapy.  She completed 12 cycles of weekly Taxol  chemotherapy in October 2022 followed by adjuvant radiation therapy.  Patient started taking letrozole  sometime in February 2023.  Patient completed adjuvant Zometa     Interval history- Victoria Dean is a 74 year old female with estrogen receptor-positive breast cancer, status post surgery, currently on adjuvant letrozole , who presents for routine post-treatment oncology surveillance.  She remains on adjuvant letrozole  therapy since 2023, with a planned duration of at least five years. She denies breast symptoms or signs of recurrence. Surveillance mammogram is due in May, and her last bone density scan was performed last year. She uses Tums for calcium  supplementation due to difficulty swallowing calcium  tablets and does not take a separate calcium  tablet.  She reports no changes in appetite aside from intentional dietary modifications. She has lost thirty pounds through a pulmonary-directed diet, which  she feels has improved her respiratory symptoms and  emotional well-being. She maintains this weight loss. She continues to use inhalers as needed for chronic respiratory symptoms and notes significant improvement in her breathing, particularly when she remains fairly quiet.  She describes ongoing mild cognitive symptoms, specifically difficulty recalling simple words, but denies other neurological complaints.      History of Present Illness    ECOG PS- 1 Pain scale- 0  Review of systems- Review of Systems  Constitutional:  Negative for chills, fever, malaise/fatigue and weight loss.  HENT:  Negative for congestion, ear discharge and nosebleeds.   Eyes:  Negative for blurred vision.  Respiratory:  Negative for cough, hemoptysis, sputum production, shortness of breath and wheezing.   Cardiovascular:  Negative for chest pain, palpitations, orthopnea and claudication.  Gastrointestinal:  Negative for abdominal pain, blood in stool, constipation, diarrhea, heartburn, melena, nausea and vomiting.  Genitourinary:  Negative for dysuria, flank pain, frequency, hematuria and urgency.  Musculoskeletal:  Negative for back pain, joint pain and myalgias.  Skin:  Negative for rash.  Neurological:  Negative for dizziness, tingling, focal weakness, seizures, weakness and headaches.  Endo/Heme/Allergies:  Does not bruise/bleed easily.  Psychiatric/Behavioral:  Negative for depression and suicidal ideas. The patient does not have insomnia.       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    Vitamin D  deficiency      Past Surgical History:  Procedure Laterality Date   ABDOMINAL HYSTERECTOMY  1995   APPENDECTOMY  1994   BREAST BIOPSY Right 1998   neg   BREAST BIOPSY Left 05/30/2020   stere bx x clip path pending   BREAST LUMPECTOMY WITH SENTINEL LYMPH NODE BIOPSY Left 06/29/2020   Procedure: BREAST LUMPECTOMY WITH SENTINEL LYMPH NODE BX;  Surgeon: Dessa Reyes ORN, MD;  Location: [facility] OR[facility];  Service: General;  Laterality: Left;   CATARACT EXTRACTION W/PHACO Left 01/31/2022   Procedure: CATARACT EXTRACTION PHACO AND INTRAOCULAR LENS PLACEMENT (IOC);  Surgeon: Harrie Agent, MD;  Location: [facility];  Service: Ophthalmology;  Laterality: Left;  CDE 8.10   CATARACT EXTRACTION W/PHACO Right 02/21/2022   Procedure: CATARACT EXTRACTION PHACO AND INTRAOCULAR LENS PLACEMENT (IOC);  Surgeon: Harrie Agent, MD;  Location: [facility];  Service: Ophthalmology;  Laterality: Right;  CDE: 9.42   ESI     LUMBAR MICRODISCECTOMY  2019   L3-4   LUMBAR MICRODISCECTOMY  2008   L4-L5 repeated   MICRODISCECTOMY LUMBAR  2008   L4-5   MICRODISCECTOMY LUMBAR  2010   L4-L5 repeated   NODE DISSECTION Left 08/13/2020   Procedure: AXILLARY NODE DISSECTION;  Surgeon: Dessa Reyes ORN, MD;  Location: [facility] OR[facility];  Service: General;  Laterality: Left;   PORTACATH PLACEMENT Right 09/05/2020   Procedure: INSERTION PORT-A-CATH;  Surgeon: Dessa Reyes ORN, MD;  Location: [facility];  Service: General;  Laterality: Right;  right; monitor anesthesia care   SQUAMOUS CELL CARCINOMA EXCISION     TONSILLECTOMY  1971    Social History   Socioeconomic History   Marital status: Widowed    Spouse name: Not on file   Number of children: 1   Years of education: Not on file   Highest education level: Bachelor's degree (e.g., BA, AB, BS)  Occupational History   Occupation: retired  Tobacco Use   Smoking status: Former    Current packs/day: 0.00    Average packs/day: 0.9 packs/day for 33.0 years  (30.0 ttl pk-yrs)    Types: Cigarettes    Start date: 04/18/1985    Quit date: 04/19/2018    Years since quitting: 5.8   Smokeless tobacco: Never  Vaping Use   Vaping status: Never Used  Substance and Sexual Activity   Alcohol use: Yes    Comment: rarely   Drug use: No   Sexual activity: Not Currently  Other Topics Concern   Not on file  Social History Narrative   Not on file   Social Drivers of Health   Tobacco Use: Medium Risk (03/09/2024)   Patient History    Smoking Tobacco Use: Former    Smokeless Tobacco Use: Never    Passive Exposure: Not on Actuary Strain: Low Risk  (11/02/2023)   Received from [facility]   Overall Financial Resource Strain (CARDIA)    Difficulty of Paying Living Expenses: Not very hard  Food Insecurity: No Food Insecurity (11/02/2023)   Received from [facility]   Epic    Within the past 12 months, you worried that your food would run out before you got the money to buy more.: Never true    Within the past 12 months, the food you bought just didn't last and you didn't have money to get more.: Never true  Transportation Needs: No Transportation Needs (11/02/2023)   Received from [facility] - Transportation    In the past 12 months, has lack of transportation kept you from medical appointments or from getting medications?: No    Lack of Transportation (Non-Medical): No  Physical Activity: Not on file  Stress: Not on file  Social Connections: Moderately Isolated (05/03/2023)   Social Connection and Isolation Panel    Frequency of Communication with Friends and Family: More than three times a week    Frequency of Social Gatherings with Friends and Family: More than three times a week    Attends Religious Services: More than 4 times per year    Active Member of [employer] or Organizations: No    Attends Banker Meetings: Never    Marital Status: Widowed  Intimate  Partner Violence: Not At Risk (05/03/2023)   Humiliation, Afraid, Rape, and Kick questionnaire    Fear of Current or Ex-Partner: No    Emotionally Abused: No    Physically Abused: No    Sexually Abused: No  Depression (PHQ2-9): Low Risk (03/09/2024)   Depression (PHQ2-9)    PHQ-2 Score: 0  Alcohol Screen: Low Risk (10/23/2021)   Alcohol Screen    Last Alcohol Screening Score (AUDIT): 0  Housing: Low Risk  (12/15/2023)   Received [facility]   Epic    In the last 12 months, was there a time when you were not able to pay the mortgage or rent on time?: No    In the past 12 months, how many times  have you moved where you were living?: 1    At any time in the past 12 months, were you homeless or living in a shelter (including now)?: No  Utilities: Not At Risk (11/02/2023)   Received from [facility]   Epic    In the past 12 months has the electric, gas, oil, or water  company threatened to shut off services in your home?: No  Health Literacy: Not on file    Family History  Problem Relation Age of Onset   Breast cancer Mother 62   Osteosarcoma Mother        d. 47   Hypertension Brother    COPD Father    Hypertension Father    Breast cancer Paternal Aunt        dx 44s   Breast cancer Paternal Grandmother        dx 65s   Stomach cancer Paternal Grandfather        dx 42s   Breast cancer Paternal Aunt        dx 43s   Brain cancer Paternal Aunt        dx 59s    Current Medications[2]  Physical exam:  Vitals:   03/09/24 1105  BP: 125/76  Pulse: 94  Resp: 19  Temp: 98.2 F (36.8 C)  TempSrc: Tympanic  SpO2: 98%  Weight: 228 lb (103.4 kg)  Height: 5' 3 (1.6 m)   Physical Exam Cardiovascular:     Rate and Rhythm: Normal rate and regular rhythm.     Heart sounds: Normal heart sounds.  Pulmonary:     Effort: Pulmonary effort is normal.     Breath sounds: Normal breath sounds.  Skin:    General: Skin is warm and dry.   Neurological:     Mental Status: She is alert and oriented to person, place, and time.    Breast exam was performed in seated and lying down position. Patient is status post left lumpectomy with a well-healed surgical scar. No evidence of any palpable masses.  Left nipple is surgically absent.  No evidence of axillary adenopathy. No evidence of any palpable masses or lumps in the right breast. No evidence of right axillary adenopathy   I have personally reviewed labs listed below:    Latest Ref Rng & Units 10/02/2023    4:25 PM  CMP  Total Protein 6.5 - 8.1 g/dL 6.4   Total Bilirubin 0.0 - 1.2 mg/dL 0.7   Alkaline Phos 38 - 126 U/L 100   AST 15 - 41 U/L 20   ALT 0 - 44 U/L 26       Latest Ref Rng & Units 10/02/2023   10:35 AM  CBC  WBC 4.0 - 10.5 K/uL 12.6   Hemoglobin 12.0 - 15.0 g/dL 89.3   Hematocrit 63.9 - 46.0 % 34.1   Platelets 150 - 400 K/uL 236       Assessment and plan- Patient is a 74 y.o. 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  She is presently on letrozole  and this is a routine follow-up visit  Assessment and Plan    Breast cancer in remission - Clinically patient is doing well with no concerning signs and symptoms of recurrence based on today's exam She tolerates letrozole  well and adheres to calcium  and vitamin D  supplementation. - Scheduled mammogram for  May. - Continued letrozole  therapy for at least 5 years ending in 2028 - Continued calcium  supplementation via Tums and vitamin D . - Deferred bone density study until next year. - Plan follow-up in six months.         Visit Diagnosis 1. Use of letrozole  (Femara )   2. Encounter for follow-up surveillance of breast cancer   3. High risk medication use      Dr. Annah Skene, MD, MPH [facility] [phone] 03/09/2024 12:16 PM                   [1]  Allergies Allergen Reactions   Onion Nausea And Vomiting    RAW onions only - she can have cooked onions.   Statins Other (See Comments)    Myalgias Muscle weakness   Stiolto Respimat  [Tiotropium Bromide-Olodaterol] Other (See Comments)    Unknown reaction   Wound Dressing Adhesive Itching and Other (See Comments)    Redness Skin irritation Specifically Band-Aid brand bandages with cloth backing.   Zetia  [Ezetimibe ] Other (See Comments)    Myalgias Stomach pain   Sulfa Antibiotics Rash  [2]  Current Outpatient Medications:    acetaminophen  (TYLENOL ) 325 MG tablet, Take 2 tablets (650 mg total) by mouth every 4 (four) hours as needed for mild pain (pain score 1-3), fever or headache (or Fever >/= 101)., Disp: , Rfl:    acetylcysteine  (MUCOMYST ) 20 % nebulizer solution, , Disp: , Rfl:    albuterol  (VENTOLIN  HFA) 108 (90 Base) MCG/ACT inhaler, Inhale 2 puffs into the lungs every 6 (six) hours as needed for wheezing or shortness of breath., Disp: 8 g, Rfl: 1   amLODipine  (NORVASC ) 10 MG tablet, Take 10 mg by mouth daily., Disp: , Rfl:    aspirin  EC 81 MG tablet, Take 1 tablet (81 mg total) by mouth daily with breakfast. Swallow whole., Disp: , Rfl:    BIOTIN  PO, Take 1 tablet by mouth daily., Disp: , Rfl:    Cholecalciferol  (VITAMIN D -3 PO), Take 1 capsule by mouth daily., Disp: , Rfl:    fluticasone -salmeterol (ADVAIR) 250-50 MCG/ACT AEPB, 1 puff 2 (two) times daily., Disp: , Rfl:    folic acid  (FOLVITE ) 1 MG tablet, Take 1 tablet (1 mg total) by mouth daily., Disp: , Rfl:    hydrocortisone 2.5 % lotion, Apply 1 application  topically daily as needed (itching, skin irritation)., Disp: , Rfl:    letrozole  (FEMARA ) 2.5 MG tablet, TAKE 1 TABLET EVERY DAY, Disp: 90 tablet, Rfl: 3   losartan  (COZAAR ) 100 MG tablet, TAKE 1 TABLET EVERY DAY, Disp: 90 tablet, Rfl: 1   MELATONIN PO, Take 1 tablet by mouth at bedtime.,  Disp: , Rfl:    Menthol, Topical Analgesic, (BIOFREEZE EX), Apply 1 application topically daily as needed (back pain)., Disp: , Rfl:    metoprolol  tartrate (LOPRESSOR ) 100 MG tablet, Take 1 tablet (100 mg total) by mouth once for 1 dose. Please take one time dose 100mg  metoprolol  tartrate 2 hr prior to cardiac CT for HR control IF HR >55bpm., Disp: 1 tablet, Rfl: 0   Propylene Glycol (SYSTANE COMPLETE) 0.6 % SOLN, Place 1 drop into both eyes 2 (two) times daily as needed (dry or irritated eyes)., Disp: , Rfl:    rosuvastatin  (CRESTOR ) 10 MG tablet, Take 10 mg by mouth at bedtime., Disp: , Rfl:    tirzepatide 10 MG/0.5ML injection vial, Inject 10 mg into the skin., Disp: , Rfl:    venlafaxine   XR (EFFEXOR -XR) 150 MG 24 hr capsule, Take 150 mg by mouth at bedtime., Disp: , Rfl:    ALPRAZolam  (XANAX ) 0.25 MG tablet, Take 1 tablet (0.25 mg total) by mouth 3 (three) times daily., Disp: 15 tablet, Rfl: 0   B-D 3CC LUER-LOK SYR 25GX1 25G X 1 3 ML MISC, , Disp: , Rfl:    clobetasol (TEMOVATE) 0.05 % external solution, Apply 1 Application topically daily as needed (Dermatitis). (Patient not taking: Reported on 09/07/2023), Disp: , Rfl:    colchicine 0.6 MG tablet, Take 0.6 mg by mouth daily., Disp: , Rfl:    cyanocobalamin  (VITAMIN B12) 1000 MCG/ML injection, INJECT 1 ML (1,000 MCG TOTAL) INTO THE MUSCLE EVERY 30 (THIRTY) DAYS., Disp: 3 mL, Rfl: 3   cyclobenzaprine (FLEXERIL) 10 MG tablet, TAKE ONE TABLET BY MOUTH THREE TIMES DAILY AS NEEDED FOR 10 DAYS, Disp: , Rfl:    furosemide  (LASIX ) 40 MG tablet, Take 1 tablet (40 mg total) by mouth in the morning. Take for up to 5 days at a time as needed for fluid buildup (Patient not taking: Reported on 03/09/2024), Disp: 30 tablet, Rfl: 0   ipratropium-albuterol  (DUONEB) 0.5-2.5 (3) MG/3ML SOLN, Take 3 mLs by nebulization 3 (three) times daily., Disp: 360 mL, Rfl: 1   mometasone  (ELOCON ) 0.1 % cream, APPLY A THIN LAYER TO THE AFFECTED AREA(S) BY TOPICAL ROUTE ONCE  DAILY, Disp: , Rfl:    Needle, Disp, (HYPODERMIC NEEDLE 18GX1) 18G X 1 MISC, 25 each by Other route., Disp: , Rfl:    pantoprazole  (PROTONIX ) 40 MG tablet, Take 1 tablet (40 mg total) by mouth daily., Disp: 30 tablet, Rfl: 1   predniSONE  (DELTASONE ) 10 MG tablet, Take 6 tablets (60 mg total) by mouth daily with breakfast. And decrease by one tablet daily, Disp: 21 tablet, Rfl: 0   propranolol  (INDERAL ) 10 MG tablet, Take 10-20 mg by mouth 3 (three) times daily as needed (Anxiety)., Disp: , Rfl:    Syringe, Disposable, (5-6CC SYRINGE) 6 ML MISC, 25 each by Other route., Disp: , Rfl:    tiZANidine (ZANAFLEX) 4 MG capsule, Take 4 mg by mouth., Disp: , Rfl:   "

## 2024-06-09 ENCOUNTER — Encounter

## 2024-09-06 ENCOUNTER — Inpatient Hospital Stay: Admitting: Oncology

## 7363-05-05 DEATH — deceased
